# Patient Record
Sex: Male | Born: 1960 | Race: Black or African American | Hispanic: No | Marital: Married | Smoking: Former smoker
Health system: Southern US, Community
[De-identification: ages and names within clinical notes are randomized; demographics above are authoritative.]

## PROBLEM LIST (undated history)

## (undated) DIAGNOSIS — IMO0002 Reserved for concepts with insufficient information to code with codable children: Secondary | ICD-10-CM

## (undated) DIAGNOSIS — M25519 Pain in unspecified shoulder: Secondary | ICD-10-CM

## (undated) DIAGNOSIS — E119 Type 2 diabetes mellitus without complications: Secondary | ICD-10-CM

## (undated) DIAGNOSIS — G4730 Sleep apnea, unspecified: Secondary | ICD-10-CM

## (undated) DIAGNOSIS — I10 Essential (primary) hypertension: Secondary | ICD-10-CM

## (undated) DIAGNOSIS — F329 Major depressive disorder, single episode, unspecified: Secondary | ICD-10-CM

## (undated) DIAGNOSIS — F4310 Post-traumatic stress disorder, unspecified: Secondary | ICD-10-CM

## (undated) DIAGNOSIS — F32A Depression, unspecified: Secondary | ICD-10-CM

## (undated) DIAGNOSIS — F419 Anxiety disorder, unspecified: Secondary | ICD-10-CM

## (undated) DIAGNOSIS — E785 Hyperlipidemia, unspecified: Secondary | ICD-10-CM

## (undated) HISTORY — PX: OTHER SURGICAL HISTORY: SHX169

## (undated) HISTORY — DX: Essential (primary) hypertension: I10

## (undated) HISTORY — PX: HERNIA REPAIR: SHX51

## (undated) HISTORY — DX: Hyperlipidemia, unspecified: E78.5

## (undated) HISTORY — DX: Depression, unspecified: F32.A

## (undated) HISTORY — DX: Reserved for concepts with insufficient information to code with codable children: IMO0002

## (undated) HISTORY — DX: Anxiety disorder, unspecified: F41.9

## (undated) HISTORY — DX: Post-traumatic stress disorder, unspecified: F43.10

## (undated) HISTORY — DX: Major depressive disorder, single episode, unspecified: F32.9

## (undated) HISTORY — DX: Pain in unspecified shoulder: M25.519

## (undated) HISTORY — DX: Type 2 diabetes mellitus without complications: E11.9

## (undated) HISTORY — PX: LEG AMPUTATION: SHX1105

## (undated) NOTE — *Deleted (*Deleted)
63 y.o. male with medical history significant for insulin-dependent type 2 diabetes, hypertension, hyperlipidemia, right AKA, depression, anxiety, PTSD who is admitted with DKA/skin abscesses with cellulitis.  Slow to improve, so ID consulted as well as GS for I/d in the OR.  DKA in setting of insulin-dependent type 2 diabetes: In setting of cellulitis and not taking home insulin for few days. DKA resolved with insulin gtt. Transitioned back to  lantus plus novolog  Cellulitis/abscess-IV abx/vanco per ID -went to the OR on 11/19 for I/D for 2 areas -wound care per general surgery -op cultures indicate MRSA with gm +cocci in clusters. Blood cx -ve. Most likely can go home with po doxycycline.   AKI: In setting of DKA.  Continue management as above and follow labs.  Hypertension: Continue Coreg.  Hold lisinopril with AKI.  Hyperlipidemia: Continue atorvastatin.  Depression/anxiety/PTSD: Continue Wellbutrin, BuSpar, Atarax, prazosin

---

## 2016-04-30 ENCOUNTER — Ambulatory Visit (INDEPENDENT_AMBULATORY_CARE_PROVIDER_SITE_OTHER): Payer: Non-veteran care | Admitting: Licensed Clinical Social Worker

## 2016-04-30 ENCOUNTER — Encounter (INDEPENDENT_AMBULATORY_CARE_PROVIDER_SITE_OTHER): Payer: Self-pay

## 2016-04-30 ENCOUNTER — Encounter (HOSPITAL_COMMUNITY): Payer: Self-pay | Admitting: Licensed Clinical Social Worker

## 2016-04-30 DIAGNOSIS — F411 Generalized anxiety disorder: Secondary | ICD-10-CM | POA: Insufficient documentation

## 2016-04-30 DIAGNOSIS — F331 Major depressive disorder, recurrent, moderate: Secondary | ICD-10-CM | POA: Diagnosis not present
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
## 2016-05-17 ENCOUNTER — Ambulatory Visit (INDEPENDENT_AMBULATORY_CARE_PROVIDER_SITE_OTHER): Payer: Non-veteran care | Admitting: Licensed Clinical Social Worker

## 2016-05-17 DIAGNOSIS — F411 Generalized anxiety disorder: Secondary | ICD-10-CM | POA: Diagnosis not present

## 2016-05-17 DIAGNOSIS — F331 Major depressive disorder, recurrent, moderate: Secondary | ICD-10-CM

## 2016-05-23 ENCOUNTER — Encounter (HOSPITAL_COMMUNITY): Payer: Self-pay | Admitting: Licensed Clinical Social Worker

## 2016-05-23 NOTE — Progress Notes (Signed)
   THERAPIST PROGRESS NOTE  Session Time: 2:10-3pm  Participation Level: Active  Behavioral Response: CasualAlertAnxious  Type of Therapy: Individual Therapy  Treatment Goals addressed: Coping  Interventions: Supportive  Summary: Mark [name] is a 56 y.o. male. Today is the first day of therapy with pt. At last appt pt was referred to MH-IOP Program. 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 Before today pt has been trying to get information about whether Choice program will pay for MH-IOP. Spent the entire session on the phone with VA benefits and Choice Program. It was frustrating for pt and provider trying to get the necessary program for pt. Finally VA faxed information to provider for pt. Gave packet to CM [employer] who is going to follow up with the packet to get the pt started in the MH-IOP program. Pt was thankful for the time spent on trying to get a program for the pt.  Suicidal/Homicidal: Nowithout intent/plan  Therapist Response: Made another appt with this provider and another appt with psychiatrist here at [facility]. Will followup with CM for results of VA pkg.  Plan: Return again in 3 weeks.  Diagnosis: Axis I MDD, recurrent episode moderate        MACKENZIE,LISBETH S, LCAS 05/17/16

## 2016-06-06 ENCOUNTER — Ambulatory Visit (HOSPITAL_COMMUNITY): Payer: No Typology Code available for payment source | Admitting: Licensed Clinical Social Worker

## 2016-06-08 ENCOUNTER — Encounter (HOSPITAL_COMMUNITY): Payer: Self-pay | Admitting: Psychiatry

## 2016-06-08 ENCOUNTER — Ambulatory Visit (INDEPENDENT_AMBULATORY_CARE_PROVIDER_SITE_OTHER): Payer: Non-veteran care | Admitting: Psychiatry

## 2016-06-08 VITALS — BP 140/72 | HR 106

## 2016-06-08 DIAGNOSIS — F331 Major depressive disorder, recurrent, moderate: Secondary | ICD-10-CM

## 2016-06-08 DIAGNOSIS — F17210 Nicotine dependence, cigarettes, uncomplicated: Secondary | ICD-10-CM | POA: Diagnosis not present

## 2016-06-08 DIAGNOSIS — F419 Anxiety disorder, unspecified: Secondary | ICD-10-CM | POA: Diagnosis not present

## 2016-06-08 DIAGNOSIS — F4310 Post-traumatic stress disorder, unspecified: Secondary | ICD-10-CM

## 2016-06-08 MED ORDER — DULOXETINE HCL 20 MG PO CPEP: ORAL_CAPSULE | ORAL | 1 refills | Status: DC
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
## 2016-06-19 ENCOUNTER — Ambulatory Visit (HOSPITAL_COMMUNITY): Payer: No Typology Code available for payment source | Admitting: Psychiatry

## 2016-06-27 ENCOUNTER — Ambulatory Visit (INDEPENDENT_AMBULATORY_CARE_PROVIDER_SITE_OTHER): Payer: Non-veteran care | Admitting: Licensed Clinical Social Worker

## 2016-06-27 DIAGNOSIS — F331 Major depressive disorder, recurrent, moderate: Secondary | ICD-10-CM | POA: Diagnosis not present

## 2016-06-27 DIAGNOSIS — F411 Generalized anxiety disorder: Secondary | ICD-10-CM | POA: Diagnosis not present

## 2016-06-27 NOTE — Progress Notes (Signed)
   THERAPIST PROGRESS NOTE  Session Time: 4:10-5pm  Participation Level: Active  Behavioral Response: CasualAlertAnxious  Type of Therapy: Individual Therapy  Treatment Goals addressed: Coping  Interventions: Supportive  Summary: Mark [name] is a 56 y.o. male. Pt was trying to get into the IOP [facility] but [facility] wouldn't [facility] for it so he now drives to [place] to do an MH-OP program there 1x per week.  Pt discussed his psychiatric symptoms and current life events. Pt went to [place] to see his wife and children. It was good to see his family. Processed with pt his continued living here since his aunt has passed away. Pt reports he has many decisions to make about the next phase of his life. Pt has made many strides getting his aunt's house so that he can use it as an amputee. He is very good at asking the [facility] for benefits to fix his home so that he can be safe in his environment. Pt is having stress and worrying about getting what he needs. Taught pt healthy coping skills, grounding ([id number]) and guided imagery. Suggested also to pt that he use journaling to write down his thoughts and feelings in between sessions. The few minutes remaining pt wanted to discuss control - what he has control of and what he doesn't. Pt was receptive during the intervention.      Suicidal/Homicidal: Nowithout intent/plan  Therapist Response: Assessed pt's current functioning and and reviewed progress. Assisted pt with processing for stressors of family, VA, stress, worrying, healthy coping skills for the management of his stressors.  Plan: Return again in 3 weeks.  Diagnosis: Axis I MDD, recurrent episode moderate Generalized anxiety disorder        MACKENZIE,LISBETH S, LCAS 06/28/15

## 2016-07-02 ENCOUNTER — Encounter (HOSPITAL_COMMUNITY): Payer: Self-pay | Admitting: Licensed Clinical Social Worker

## 2016-07-04 ENCOUNTER — Ambulatory Visit (INDEPENDENT_AMBULATORY_CARE_PROVIDER_SITE_OTHER): Payer: Non-veteran care | Admitting: Licensed Clinical Social Worker

## 2016-07-04 ENCOUNTER — Encounter (HOSPITAL_COMMUNITY): Payer: Self-pay | Admitting: Licensed Clinical Social Worker

## 2016-07-04 DIAGNOSIS — F331 Major depressive disorder, recurrent, moderate: Secondary | ICD-10-CM

## 2016-07-04 NOTE — Progress Notes (Signed)
   THERAPIST PROGRESS NOTE  Session Time: 11:10-12:00pm  Participation Level: Active  Behavioral Response: CasualAlertAnxious  Type of Therapy: Individual Therapy  Treatment Goals addressed: Coping  Interventions: Supportive  Summary: Eugenio Hoesric Lillibridge is a 56 y.o. male. Pt discussed his psychiatric symptoms and current life events. Pt presented frustrated today due to lack of follow through with the [facility]. He is disabled and [facility]. He is good with follow up with the [facility] but they are slow moving. Pt's wife will be coming here this weekend, which will help pt emotionally as well as help him do some things around the house. Discussed at length his decision to stay in [place] or go back to [place] where he has more support. Pt used his problem-solving and decision making skills in the discussion. Pt is attending CBT [facility] per [facility] in [place] with other vets. He enjoys and is hoping to connect with other disabled veterans. Pt feels his meds are working and his depressive and anxious feelings are minimizing. Will continue to work with pt in addition to his CBT group.     Suicidal/Homicidal: Nowithout intent/plan  Therapist Response: Assessed pt's current functioning and and reviewed progress. Assisted pt with processing for stressors of VA, stress, worrying, healthy coping skills for the management of his stressors.  Plan: Return again in 1 weeks.  Diagnosis: Axis I MDD, recurrent episode moderate Generalized anxiety disorder        Riese Hellard S, LCAS  07/04/16

## 2016-07-06 ENCOUNTER — Ambulatory Visit (HOSPITAL_COMMUNITY): Payer: No Typology Code available for payment source | Admitting: Psychiatry

## 2016-07-09 ENCOUNTER — Ambulatory Visit (HOSPITAL_COMMUNITY): Payer: No Typology Code available for payment source | Admitting: Licensed Clinical Social Worker

## 2016-07-11 ENCOUNTER — Ambulatory Visit (HOSPITAL_COMMUNITY): Payer: No Typology Code available for payment source | Admitting: Licensed Clinical Social Worker

## 2016-07-18 ENCOUNTER — Ambulatory Visit (INDEPENDENT_AMBULATORY_CARE_PROVIDER_SITE_OTHER): Payer: Non-veteran care | Admitting: Licensed Clinical Social Worker

## 2016-07-18 DIAGNOSIS — F411 Generalized anxiety disorder: Secondary | ICD-10-CM | POA: Diagnosis not present

## 2016-07-18 DIAGNOSIS — F331 Major depressive disorder, recurrent, moderate: Secondary | ICD-10-CM | POA: Diagnosis not present

## 2016-07-30 ENCOUNTER — Encounter (HOSPITAL_COMMUNITY): Payer: Self-pay | Admitting: Licensed Clinical Social Worker

## 2016-07-30 NOTE — Progress Notes (Signed)
   THERAPIST PROGRESS NOTE  Session Time: 11:10-12:00pm  Participation Level: Active  Behavioral Response: CasualAlertAnxious  Type of Therapy: Individual Therapy  Treatment Goals addressed: Coping  Interventions: Supportive  Summary: Mark [name] is a 56 y.o. male. Pt discussed his psychiatric symptoms and current life events. VA finally put his ramp in which makes it easier for pt and his scooter. Pt still uses his crutches inside which is difficult in cooking. Pt went home for father's day and it was good to be home. He reports he is tired and frustrated. He needs help with arranging his house and has no support. Processed this with pt. Gave pt suggestions on people who will help vets. Pt has started at the gym which should help with some of his tiredness. Pt is an avid reader and taught pt how to get library card in [place]. Pt is attending [facility]. He says it is mandatory for him to attend after his CBT classes. He reports both of these classes are refresher courses but it gets him out of the house. Taught pt mindfulness techniques and explained the process, purpose and practice of the techniques. Pt was open and receptive to the practice and was willing to engage in practice during session.Gave pt handout on mindfulness practice.      Suicidal/Homicidal: Nowithout intent/plan  Therapist Response: Assessed pt's current functioning and and reviewed progress. Assisted pt with processing for stressors of VA, stressors, library card healthy coping skills for the management of his stressors.  Plan: Return again in 2 weeks.  Diagnosis: Axis I MDD, recurrent episode moderate Generalized anxiety disorder        Somya Jauregui S, LCAS  07/18/16

## 2016-08-01 ENCOUNTER — Ambulatory Visit (INDEPENDENT_AMBULATORY_CARE_PROVIDER_SITE_OTHER): Payer: Non-veteran care | Admitting: Psychiatry

## 2016-08-01 ENCOUNTER — Encounter (HOSPITAL_COMMUNITY): Payer: Self-pay | Admitting: Psychiatry

## 2016-08-01 VITALS — BP 138/70 | HR 104 | Ht 73.5 in | Wt 295.0 lb

## 2016-08-01 DIAGNOSIS — F331 Major depressive disorder, recurrent, moderate: Secondary | ICD-10-CM

## 2016-08-01 DIAGNOSIS — F17210 Nicotine dependence, cigarettes, uncomplicated: Secondary | ICD-10-CM | POA: Diagnosis not present

## 2016-08-01 DIAGNOSIS — F4310 Post-traumatic stress disorder, unspecified: Secondary | ICD-10-CM | POA: Diagnosis not present

## 2016-08-01 DIAGNOSIS — G4700 Insomnia, unspecified: Secondary | ICD-10-CM | POA: Diagnosis not present

## 2016-08-01 MED ORDER — DULOXETINE HCL 30 MG PO CPEP: ORAL_CAPSULE | ORAL | 1 refills | Status: DC

## 2016-08-01 MED ORDER — PRAZOSIN HCL 1 MG PO CAPS: 1.0000 mg | ORAL_CAPSULE | Freq: Every day | ORAL | 1 refills | Status: DC

## 2016-08-01 MED ORDER — BUPROPION HCL ER (XL) 300 MG PO TB24: 300.0000 mg | ORAL_TABLET | Freq: Every day | ORAL | 1 refills | Status: DC

## 2016-08-01 NOTE — Progress Notes (Signed)
BH MD/PA/NP OP Progress Note  08/01/2016 8:43 AM Mark [name]  MRN:  [id number]  Chief Complaint:  Chief Complaint    Follow-up; Depression     Subjective:  I like the medication but I forgot to take twice a day.  He is a 56 year old African-American man who [facility]  because of worsening depression and anxiety symptoms.  He was taking Effexor and Wellbutrin for past few years but continued to have depression, anxiety, fatigue, lack of energy, focus and motivation.  He reported his medicines were not changed in past 3 years and his symptoms are gradually getting worse.  Patient has a right knee above knee amputation but he is able to do most of the ADLs.  He was also complaining of lack of libido and desire .  Patient moved from [place] so he can take care of his aunt who eventually died in March.  Patient is taking care of her house.  He still in touch with his wife but he admitted when they're living together there is always argument but now they have good relationship.  Patient also endorsed that he forgot to cut down his Effexor but feeling better with the Cymbalta.  We started Cymbalta 20 mg twice a day but he is only taking 20 mg as he forgot to take the second dose.  He notices energy level is improved.  He is more social, active and started to cook which he usually enjoyed.  He is also applying for a service dog for the companion.  He is making changes at her aunt's house so he can mobilize their easily.  He has no tremors, shakes or any EPS.  We have not received any records from his primary care physician.  He believed his diabetes is also getting better because he is checking his blood sugar but we do not have hemoglobin A1c.  Patient denies any paranoia, hallucination, suicidal thoughts or homicidal thought but admitted some time emotional and easily tearful denies any aggression.  His daughter lives in [place] and to son lives in [place].  Patient denies drinking  alcohol or using any illegal substances.  Currently he is taking Cymbalta 20 mg daily, Wellbutrin XL 300 mg daily and Minipress 1 mg at bedtime for nightmares.  He has notices nightmares are less intense since he started Cymbalta.  He continues to take Effexor but plan to taper down and eventually stopped.   HPI: see above.   Visit Diagnosis:    ICD-10-CM   1. Moderate episode of recurrent major depressive disorder (HCC) F33.1 DULoxetine (CYMBALTA) 30 MG capsule    buPROPion (WELLBUTRIN XL) 300 MG 24 hr tablet  2. PTSD (post-traumatic stress disorder) F43.10 prazosin (MINIPRESS) 1 MG capsule    Past Psychiatric History:  Patient reported feeling depressed since mother died in 2009 and in 2010 he started having knee pain.  [facility] and given Prozac which he took for many years but stopped working.  Patient denies any history of suicidal attempt or any psychiatric inpatient treatment.  He has PTSD and he had excessive treatment [facility].  He was taking Effexor 225 mg along with Wellbutrin and Minipress from [facility].  Patient denies any history of violence or aggression.    Past Medical History:  Past Medical History:  Diagnosis Date  . Above knee amputation status, right (HCC)   . Anxiety   . Depression   . Diabetes mellitus, type II (HCC)   . Shoulder pain  Past Surgical History:  Procedure Laterality Date  . HERNIA REPAIR    . LEG AMPUTATION Right     Family Psychiatric History:Reviewed.   Family History:  Family History  Problem Relation Age of Onset  . Aneurysm Mother   . Leukemia Father     Social History:  Social History   Social History  . Marital status: Married    Spouse name: N/A  . Number of children: N/A  . Years of education: N/A   Social History Main Topics  . Smoking status: Current Every Day Smoker    Packs/day: 1.50    Years: 35.00    Types: Cigarettes  . Smokeless tobacco: Never Used     Comment: Has cut back to 1/2 pack and trying to  quit  . Alcohol use 3.0 oz/week    2 Glasses of wine, 2 Shots of liquor, 1 Cans of beer per week  . Drug use: No  . Sexual activity: Not Currently   Other Topics Concern  . None   Social History Narrative  . None    Allergies:  Allergies  Allergen Reactions  . Lyrica [Pregabalin] Rash    Metabolic Disorder Labs: No results found for: HGBA1C, MPG No results found for: PROLACTIN No results found for: CHOL, TRIG, HDL, CHOLHDL, VLDL, LDLCALC   Current Medications: Current Outpatient Prescriptions  Medication Sig Dispense Refill  . buPROPion (WELLBUTRIN XL) 300 MG 24 hr tablet Take 1 tablet (300 mg total) by mouth daily. 30 tablet 1  . cyclobenzaprine (FLEXERIL) 10 MG tablet Take 10 mg by mouth 3 (three) times daily as needed for muscle spasms.    Marland Kitchen docusate sodium (COLACE) 50 MG capsule Take 50 mg by mouth 2 (two) times daily.    . DULoxetine (CYMBALTA) 30 MG capsule Take 1 capsule daily for 1 week and than twice daily 60 capsule 1  . insulin aspart protamine- aspart (NOVOLOG MIX 70/30) (70-30) 100 UNIT/ML injection Inject into the skin.    . Insulin Glargine (LANTUS [place]) Inject into the skin.    . metFORMIN (GLUCOPHAGE) 1000 MG tablet Take 1,000 mg by mouth 2 (two) times daily with a meal.    . oxyCODONE-acetaminophen (PERCOCET) 5-325 MG tablet Take by mouth every 4 (four) hours as needed for severe pain.    . prazosin (MINIPRESS) 1 MG capsule Take 1 capsule (1 mg total) by mouth at bedtime. 30 capsule 1  . sildenafil (VIAGRA) 100 MG tablet Take 100 mg by mouth daily as needed for erectile dysfunction.    Marland Kitchen atorvastatin (LIPITOR) 80 MG tablet Take 80 mg by mouth daily.    Marland Kitchen lisinopril (PRINIVIL,ZESTRIL) 5 MG tablet Take 5 mg by mouth daily.     No current facility-administered medications for this visit.     Neurologic: Headache: No Seizure: No Paresthesias: No  Musculoskeletal: Strength & Muscle Tone: decreased Gait & Station: unsteady, Patient has a right above-knee  amputation. Patient leans: See above  Psychiatric Specialty Exam: Review of Systems  Constitutional: Negative.   HENT: Negative.   Eyes: Negative.   Respiratory: Negative.   Cardiovascular: Negative.   Gastrointestinal: Negative.   Genitourinary: Negative.   Musculoskeletal: Positive for joint pain.       Right above-knee amputation  Skin: Negative.  Negative for itching and rash.  Neurological: Negative.   Psychiatric/Behavioral: The patient is nervous/anxious and has insomnia.     Blood pressure 138/70, pulse (!) 104, height 6' 1.5" (1.867 m), weight 295 lb (133.8 kg).Body mass  index is 38.39 kg/m.  General Appearance: Casual  Eye Contact:  Good  Speech:  Clear and Coherent  Volume:  Normal  Mood:  Angry  Affect:  Constricted  Thought Process:  Goal Directed  Orientation:  Full (Time, Place, and Person)  Thought Content: Rumination   Suicidal Thoughts:  No  Homicidal Thoughts:  No  Memory:  Immediate;   Good Recent;   Good Remote;   Good  Judgement:  Good  Insight:  Good  Psychomotor Activity:  Normal  Concentration:  Concentration: Fair and Attention Span: Good  Recall:  Good  Fund of Knowledge: Good  Language: Good  Akathisia:  No  Handed:  Right  AIMS (if indicated):  0  Assets:  Communication Skills Desire for Improvement Housing Resilience  ADL's:  Intact  Cognition: WNL  Sleep: fair    Assessment: Major depressive disorder, recurrent.  Posttraumatic stress disorder.  Plan: I review his symptoms, psychosocial stressors in current medication.  Patient forgot to reduce Effexor and he continued to endorse loss of libido and desire.  He promised that he will taper down the Effexor as he started Cymbalta.  He is feeling better with Cymbalta.  Recommended to increase Cymbalta 30 mg and take twice a day.  Reduce Effexor from 225-150 for 1 week and then 75 and eventually stopped.  Continue Minipress but he is taking only 1 mg at bedtime which is helping his  nightmare.  Continue Wellbutrin XL 300 mg daily.  We are still awaiting records from primary care physician area patient will get hemoglobin A1c in few weeks.  Encouraged to continue therapy with Beth.  Discussed meditation side effects and benefits.  He has no tremors shakes or any EPS.  Recommended to call us back if he is any question, concern or if he feels worsening of the symptom.  Reassurance given and explained treatment plan in detail.  Follow-up in 2 months.  Discuss safety concerns that anytime having active suicidal thoughts or homicidal thought that he need to call 911 or go to the local emergency room.  Jere Bostrom T., MD 08/01/2016, 8:43 AM

## 2016-08-13 ENCOUNTER — Ambulatory Visit (INDEPENDENT_AMBULATORY_CARE_PROVIDER_SITE_OTHER): Payer: Non-veteran care | Admitting: Licensed Clinical Social Worker

## 2016-08-13 DIAGNOSIS — F331 Major depressive disorder, recurrent, moderate: Secondary | ICD-10-CM | POA: Diagnosis not present

## 2016-08-14 ENCOUNTER — Encounter (HOSPITAL_COMMUNITY): Payer: Self-pay | Admitting: Licensed Clinical Social Worker

## 2016-08-14 NOTE — Progress Notes (Signed)
   THERAPIST PROGRESS NOTE  Session Time: 4:10-5pm  Participation Level: Active  Behavioral Response: Casual/Alert/Eurthymic  Type of Therapy: Individual Therapy  Treatment Goals addressed: Coping  Interventions: Supportive  Summary: Mark [name] is a 56 y.o. male. Pt discussed his psychiatric symptoms and current life events. Pt was  More upbeat today and reports his medications are working. He was in a wheelchair because his scooter is broken. He is waiting for the VA to come repair it. He was also using his crutches. Pt has been busy in his house finally getting the house wheelchair ready. [facility] but will not begin anger management classes in 4 weeks. Pt is going to the gym regularly which has improved his mood. He has also looked into getting a dog and a service dog.  Pt reports he has been using the mindfulness techniques practiced in last session, from the handout. Asked pt open ended questions about his support system. Pt has some relatives that help him. Suggested going to [facility] to see if they have support groups for vets with amputations. Taught pt grounding techniques where pt practiced in session, gave him handout.          Suicidal/Homicidal: Nowithout intent/plan  Therapist Response: Assessed pt's current functioning and and reviewed progress. Assisted pt with processing for stressors of VA, exercise, mindfulness and grounding techniques. Assisted pt processing healthy coping skills for the management of his stressors.  Plan: Return again in 2 weeks.  Diagnosis: Axis I MDD, recurrent episode moderate Generalized anxiety disorder        Hance Caspers S, LCAS  08/13/16

## 2016-08-20 ENCOUNTER — Ambulatory Visit (HOSPITAL_COMMUNITY): Payer: No Typology Code available for payment source | Admitting: Licensed Clinical Social Worker

## 2016-08-27 ENCOUNTER — Ambulatory Visit (INDEPENDENT_AMBULATORY_CARE_PROVIDER_SITE_OTHER): Payer: Non-veteran care | Admitting: Licensed Clinical Social Worker

## 2016-08-27 DIAGNOSIS — F331 Major depressive disorder, recurrent, moderate: Secondary | ICD-10-CM

## 2016-08-28 ENCOUNTER — Encounter (HOSPITAL_COMMUNITY): Payer: Self-pay | Admitting: Licensed Clinical Social Worker

## 2016-08-28 NOTE — Progress Notes (Signed)
   THERAPIST PROGRESS NOTE  Session Time: 11:10-12pm  Participation Level: Active  Behavioral Response: Casual/Alert/Eurthymic  Type of Therapy: Individual Therapy  Treatment Goals addressed: Coping  Interventions: Supportive  Summary: Mark [name] is a 56 y.o. male. Pt discussed his psychiatric symptoms and current life events. Pt was  More upbeat today and reports his medications are working. He was in a s scooter which is still broken. He is waiting for the VA to get another piece.to repair it. Pt is frustrated with the VA and how long it takes to get his needs met. Pt is also waiting on the [place] for his wheelchair, which will help him get around better in his house. And complete ADLs. Pt is still looking into a service dog. He has several leads. Pt is going home next weekend to have a family meeting. Processed how to ask for what he needs with his family. Role played with him.Processed with pt his happiness and contentment must come from within. Gave pt happiness worksheet to complete at home.           Suicidal/Homicidal: Nowithout intent/plan  Therapist Response: Assessed pt's current functioning and and reviewed progress. Assisted pt with processing for stressors of VA, family, needs being met, [facility]. Assisted pt processing healthy coping skills for the management of his stressors.  Plan: Return again in 2 weeks.  Diagnosis: Axis I MDD, recurrent episode moderate Generalized anxiety disorder        MACKENZIE,LISBETH S, LCAS  08/27/16

## 2016-09-03 ENCOUNTER — Encounter (HOSPITAL_COMMUNITY): Payer: Self-pay | Admitting: Licensed Clinical Social Worker

## 2016-09-03 ENCOUNTER — Ambulatory Visit (INDEPENDENT_AMBULATORY_CARE_PROVIDER_SITE_OTHER): Payer: Non-veteran care | Admitting: Licensed Clinical Social Worker

## 2016-09-03 DIAGNOSIS — F411 Generalized anxiety disorder: Secondary | ICD-10-CM | POA: Diagnosis not present

## 2016-09-03 DIAGNOSIS — F331 Major depressive disorder, recurrent, moderate: Secondary | ICD-10-CM

## 2016-09-03 NOTE — Progress Notes (Signed)
   THERAPIST PROGRESS NOTE  Session Time: 11:10-12pm  Participation Level: Active  Behavioral Response: Casual/Alert/Exhausted  Type of Therapy: Individual Therapy  Treatment Goals addressed: Coping  Interventions: Supportive  Summary: Mark [name] is a 56 y.o. male. Pt discussed his psychiatric symptoms and current life events. Pt was exhausted today as he presented for his appointment. Pt had just driven home from [place] after visiting with his family. Pt reports he had a good visit with his 2 children, who are getting ready to return to college. Pt had a family meeting with everyone to discuss his feelings and concerns. Discussed basic CBT concepts with pt, the thought and emotion connection and alternative perspectives. Pt did not complete his worksheet due to his trip home. Processed with pt how his happiness is his own responsibility and how to make his own goals for his life.       Suicidal/Homicidal: Nowithout intent/plan  Therapist Response: Assessed pt's current functioning and and reviewed progress. Assisted pt with processing for stressors of family, needs being met, CBT concepts, Assisted pt processing healthy coping skills for the management of his stressors.  Plan: Return again in 2 weeks.  Diagnosis: Axis I MDD, recurrent episode moderate Generalized anxiety disorder        Kirsten Spearing S, LCAS  09/03/16

## 2016-09-10 ENCOUNTER — Ambulatory Visit (HOSPITAL_COMMUNITY): Payer: No Typology Code available for payment source | Admitting: Licensed Clinical Social Worker

## 2016-09-17 ENCOUNTER — Ambulatory Visit (INDEPENDENT_AMBULATORY_CARE_PROVIDER_SITE_OTHER): Payer: Non-veteran care | Admitting: Licensed Clinical Social Worker

## 2016-09-17 ENCOUNTER — Encounter (HOSPITAL_COMMUNITY): Payer: Self-pay | Admitting: Licensed Clinical Social Worker

## 2016-09-17 DIAGNOSIS — F331 Major depressive disorder, recurrent, moderate: Secondary | ICD-10-CM | POA: Diagnosis not present

## 2016-09-17 NOTE — Progress Notes (Signed)
   THERAPIST PROGRESS NOTE  Session Time: 11:10-12pm  Participation Level: Active  Behavioral Response: Casual/Alert/Eurythmic  Type of Therapy: Individual Therapy  Treatment Goals addressed: Coping  Interventions: Supportive  Summary: Mark [name] is a 56 y.o. male. Pt discussed his psychiatric symptoms and current life events. Pt has made some progress with getting his house handicap ready. Discussed with pt his feelings of "I am damaged goods." Taught pt how to challenge his belief system using emotional regulation skills. Pt was able to formulate healthier alternative thoughts.Explained purpose process and practice of mindfulness techniques. Suggested to pt to continue his use of mindfulness practice at home. Pt was open to suggestions.            Suicidal/Homicidal: Nowithout intent/plan  Therapist Response: Assessed pt's current functioning and and reviewed progress. Assisted pt with processing for stressors of self , needs being met, emotional regular skills, mindfulness practice. Assisted pt processing healthy coping skills for the management of his stressors.  Plan: Return again in 2 weeks.  Diagnosis: Axis I MDD, recurrent episode moderate Generalized anxiety disorder        MACKENZIE,LISBETH S, LCAS  09/17/16

## 2016-10-02 ENCOUNTER — Ambulatory Visit (INDEPENDENT_AMBULATORY_CARE_PROVIDER_SITE_OTHER): Payer: Non-veteran care | Admitting: Psychiatry

## 2016-10-02 ENCOUNTER — Encounter (HOSPITAL_COMMUNITY): Payer: Self-pay | Admitting: Psychiatry

## 2016-10-02 DIAGNOSIS — F17210 Nicotine dependence, cigarettes, uncomplicated: Secondary | ICD-10-CM | POA: Diagnosis not present

## 2016-10-02 DIAGNOSIS — F515 Nightmare disorder: Secondary | ICD-10-CM | POA: Diagnosis not present

## 2016-10-02 DIAGNOSIS — F331 Major depressive disorder, recurrent, moderate: Secondary | ICD-10-CM | POA: Diagnosis not present

## 2016-10-02 DIAGNOSIS — F4310 Post-traumatic stress disorder, unspecified: Secondary | ICD-10-CM | POA: Diagnosis not present

## 2016-10-02 MED ORDER — BUPROPION HCL ER (XL) 300 MG PO TB24: 300.0000 mg | ORAL_TABLET | Freq: Every day | ORAL | 0 refills | Status: DC

## 2016-10-02 MED ORDER — PRAZOSIN HCL 1 MG PO CAPS: 1.0000 mg | ORAL_CAPSULE | Freq: Every day | ORAL | 0 refills | Status: DC

## 2016-10-02 MED ORDER — DULOXETINE HCL 30 MG PO CPEP: ORAL_CAPSULE | ORAL | 0 refills | Status: DC

## 2016-10-02 NOTE — Progress Notes (Signed)
BH MD/PA/NP OP Progress Note  10/02/2016 1:38 PM Mark [name]  MRN:  [id number]  Chief Complaint: I am doing better with the medication. Chief Complaint    Follow-up     HPI: [name] came for his follow-up appointment.  He is taking Cymbalta 30 mg twice a day which was increased on his last visit.  He is less depressed and less irritable.  He is also taking Wellbutrin in the morning and Minipress at bedtime is helping his nightmares and flashback.  He is seeing Beth regularly for counseling.  He is also applying for a services.but due to waiting list he got a [place] and now he is getting her trained.  He started weekly group therapy at [facility] and that is helping him a lot.  He still have some time irritability and he does get frustrated with his wife but overall he reported his mood has been stable.  He has no rash, itching or any tremors.  He continued to keep himself busy doing renovation project at his aunt 's house where he lives.  We have not receved any records from [place] but he seen his primary care physician and his hemoglobin and C was 7.6.  His appetite is okay.  His vital signs are stable.  He is no longer taking Effexor.  His son lives in [place] with his wife.  Patient denies drinking alcohol or using any illegal substances.  He wants to continue his current psychiatric medication.    Visit Diagnosis:    ICD-10-CM   1. PTSD (post-traumatic stress disorder) F43.10 prazosin (MINIPRESS) 1 MG capsule  2. Moderate episode of recurrent major depressive disorder (HCC) F33.1 DULoxetine (CYMBALTA) 30 MG capsule    buPROPion (WELLBUTRIN XL) 300 MG 24 hr tablet    Past Psychiatric Hireviewed. nt reported feeling depressed since mother died in 27-May-2007 and in 05-26-2008 he started having knee pain.  He saw psychiatrist at [facility] and given Prozac which he took for many years but stopped working.  Patient denies any history of suicidal attempt or any psychiatric inpatient treatment.  He has PTSD and he had  excessive treatment at [place].  He was taking Effexor 225 mg along with Wellbutrin and Minipress from [place].  Patient denies any history of violence or aggression.   Past Medical History:  Past Medical History:  Diagnosis Date  . Above knee amputation status, right (HCC)   . Anxiety   . Depression   . Diabetes mellitus, type II (HCC)   . Shoulder pain     Past Surgical History:  Procedure Laterality Date  . HERNIA REPAIR    . LEG AMPUTATION Right     Family Psychiatric History: Reviewed.    Family History:  Family History  Problem Relation Age of Onset  . Aneurysm Mother   . Leukemia Father     Social History:  Social History   Social History  . Marital status: Married    Spouse name: N/A  . Number of children: N/A  . Years of education: N/A   Social History Main Topics  . Smoking status: Current Every Day Smoker    Packs/day: 1.50    Years: 35.00    Types: Cigarettes  . Smokeless tobacco: Never Used     Comment: Has cut back to 1/2 pack and trying to quit  . Alcohol use 3.0 oz/week    2 Glasses of wine, 2 Shots of liquor, 1 Cans of beer per week  . Drug use: No  .  Sexual activity: Not Currently   Other Topics Concern  . Not on file   Social History Narrative  . No narrative on file    Allergies:  Allergies  Allergen Reactions  . Lyrica [Pregabalin] Rash    Metabolic Disorder Labs: No results found for: HGBA1C, MPG No results found for: PROLACTIN No results found for: CHOL, TRIG, HDL, CHOLHDL, VLDL, LDLCALC No results found for: TSH  Therapeutic Level Labs: No results found for: LITHIUM No results found for: VALPROATE No components found for:  CBMZ  Current Medications: Current Outpatient Prescriptions  Medication Sig Dispense Refill  . atorvastatin (LIPITOR) 80 MG tablet Take 80 mg by mouth daily.    Marland Kitchen buPROPion (WELLBUTRIN XL) 300 MG 24 hr tablet Take 1 tablet (300 mg total) by mouth daily. 30 tablet 1  . cyclobenzaprine (FLEXERIL) 10 MG tablet  Take 10 mg by mouth 3 (three) times daily as needed for muscle spasms.    Marland Kitchen docusate sodium (COLACE) 50 MG capsule Take 50 mg by mouth 2 (two) times daily.    . DULoxetine (CYMBALTA) 30 MG capsule Take 1 capsule daily for 1 week and than twice daily 60 capsule 1  . insulin aspart protamine- aspart (NOVOLOG MIX 70/30) (70-30) 100 UNIT/ML injection Inject into the skin.    . Insulin Glargine (LANTUS [place]) Inject into the skin.    Marland Kitchen lisinopril (PRINIVIL,ZESTRIL) 5 MG tablet Take 5 mg by mouth daily.    . metFORMIN (GLUCOPHAGE) 1000 MG tablet Take 1,000 mg by mouth 2 (two) times daily with a meal.    . oxyCODONE-acetaminophen (PERCOCET) 5-325 MG tablet Take by mouth every 4 (four) hours as needed for severe pain.    . prazosin (MINIPRESS) 1 MG capsule Take 1 capsule (1 mg total) by mouth at bedtime. 30 capsule 1  . sildenafil (VIAGRA) 100 MG tablet Take 100 mg by mouth daily as needed for erectile dysfunction.     No current facility-administered medications for this visit.      Musculoskeletal: Strength & Muscle Tone: within normal limits Gait & Station: Patient has a right above-the-knee amputation Patient leans: See above  Psychiatric Specialty Exam: ROS  There were no vitals taken for this visit.There is no height or weight on file to calculate BMI.  General Appearance: Casual  Eye Contact:  Good  Speech:  Clear and Coherent  Volume:  Normal  Mood:  Anxious  Affect:  Appropriate  Thought Process:  Goal Directed  Orientation:  Full (Time, Place, and Person)  Thought Content: Logical   Suicidal Thoughts:  No  Homicidal Thoughts:  No  Memory:  Immediate;   Good Recent;   Good Remote;   Good  Judgement:  Good  Insight:  Good  Psychomotor Activity:  Normal  Concentration:  Concentration: Good and Attention Span: Good  Recall:  Good  Fund of Knowledge: Good  Language: Good  Akathisia:  No  Handed:  Right  AIMS (if indicated): not done  Assets:  Communication Skills Desire for  Improvement Housing  ADL's:  Intact  Cognition: WNL  Sleep:  Good   Screenings:   Assessment and Plan: major depressive disorder, recurrent.  Posttraumatic stress disorder.  Patient is doing better on Cymbalta, Wellbutrin and Minipress.  He is seeing Beth for therapy and also started weekly therapy at [place].  He has no side effects from medication.  I will continue Cymbalta 30 mg twice a day, Wellbutrin XL 300 mg daily and Minipress 1 mg at bedtime.  Recommended to call us back if he is any question or any concern.  Follow-up in 3 months.    Keylan Costabile T., MD 10/02/2016, 1:38 PM

## 2016-10-08 ENCOUNTER — Ambulatory Visit (HOSPITAL_COMMUNITY): Payer: No Typology Code available for payment source | Admitting: Licensed Clinical Social Worker

## 2016-10-15 ENCOUNTER — Ambulatory Visit (HOSPITAL_COMMUNITY): Payer: Self-pay | Admitting: Licensed Clinical Social Worker

## 2016-10-15 NOTE — Progress Notes (Unsigned)
   THERAPIST PROGRESS NOTE  Session Time: 11:10-12pm  Participation Level: Active  Behavioral Response: Casual/Alert/Eurythmic  Type of Therapy: Individual Therapy  Treatment Goals addressed: Coping  Interventions: Supportive  Summary: Mark [name] is a 56 y.o. male. Pt discussed his psychiatric symptoms and current life events. Pt has made some progress with getting his house handicap ready. Discussed with pt his feelings of "I am damaged goods." Taught pt how to challenge his belief system using emotional regulation skills. Pt was able to formulate healthier alternative thoughts.Explained purpose process and practice of mindfulness techniques. Suggested to pt to continue his use of mindfulness practice at home. Pt was open to suggestions.            Suicidal/Homicidal: Nowithout intent/plan  Therapist Response: Assessed pt's current functioning and and reviewed progress. Assisted pt with processing for stressors of self , needs being met, emotional regular skills, mindfulness practice. Assisted pt processing healthy coping skills for the management of his stressors.  Plan: Return again in 2 weeks.  Diagnosis: Axis I MDD, recurrent episode moderate Generalized anxiety disorder        [name], LCAS  09/17/16  

## 2016-10-22 ENCOUNTER — Ambulatory Visit (INDEPENDENT_AMBULATORY_CARE_PROVIDER_SITE_OTHER): Payer: No Typology Code available for payment source | Admitting: Licensed Clinical Social Worker

## 2016-10-22 DIAGNOSIS — F331 Major depressive disorder, recurrent, moderate: Secondary | ICD-10-CM | POA: Diagnosis not present

## 2016-10-24 ENCOUNTER — Encounter (HOSPITAL_COMMUNITY): Payer: Self-pay | Admitting: Licensed Clinical Social Worker

## 2016-10-24 NOTE — Progress Notes (Signed)
   THERAPIST PROGRESS NOTE  Session Time: 10:10-11am  Participation Level: Active  Behavioral Response: Casual/Alert/Eurythmic  Type of Therapy: Individual Therapy  Treatment Goals addressed: Coping  Interventions: Supportive  Summary: Mark [name] is a 56 y.o. male. Pt discussed his psychiatric symptoms and current life events. Pt reports less depressive symptoms. He continues to make progress with getting his house handicap ready. He was in a new electric chair where he can move around more easily and can cook for himself. Pt has found a dog and is completing paperwork to get it trained as a service dog. The dog will assist pt at home as well as out in the community. Pt is attending CBT groups at [place] in [place]. He enjoys the groups and the support from other disabled veterans. Pt also wants to become acitve in an amputee support group through the [place]. Asked pt open ended questions about how his current life adaptations. Pt was open and honest in talking about his feelings of powerlessness, isolation, depression. Assisted pt processing the feelings and validated pt for his ability to talk about his limitations.            Suicidal/Homicidal: Nowithout intent/plan  Therapist Response: Assessed pt's current functioning and and reviewed progress. Assisted pt with processing support groups available, cbt,  feelings surrounding his limitations.. Assisted pt processing healthy coping skills for the management of his stressors.  Plan: Return again in 2 weeks.  Diagnosis: Axis I MDD, recurrent episode moderate         MACKENZIE,LISBETH S, LCAS  10/22/16

## 2016-10-29 ENCOUNTER — Ambulatory Visit (HOSPITAL_COMMUNITY): Payer: Self-pay | Admitting: Licensed Clinical Social Worker

## 2016-11-12 ENCOUNTER — Encounter (HOSPITAL_COMMUNITY): Payer: Self-pay | Admitting: Licensed Clinical Social Worker

## 2016-11-12 ENCOUNTER — Ambulatory Visit (INDEPENDENT_AMBULATORY_CARE_PROVIDER_SITE_OTHER): Payer: No Typology Code available for payment source | Admitting: Licensed Clinical Social Worker

## 2016-11-12 ENCOUNTER — Other Ambulatory Visit (HOSPITAL_COMMUNITY): Payer: Self-pay

## 2016-11-12 DIAGNOSIS — F331 Major depressive disorder, recurrent, moderate: Secondary | ICD-10-CM

## 2016-11-12 DIAGNOSIS — F4310 Post-traumatic stress disorder, unspecified: Secondary | ICD-10-CM

## 2016-11-12 MED ORDER — BUPROPION HCL ER (XL) 300 MG PO TB24
300.0000 mg | ORAL_TABLET | Freq: Every day | ORAL | 0 refills | Status: DC
Start: 2016-11-12 — End: 2017-03-20

## 2016-11-12 MED ORDER — PRAZOSIN HCL 1 MG PO CAPS: 1.0000 mg | ORAL_CAPSULE | Freq: Every day | ORAL | 0 refills | Status: DC

## 2016-11-12 MED ORDER — DULOXETINE HCL 30 MG PO CPEP: ORAL_CAPSULE | ORAL | 0 refills | Status: DC

## 2016-11-12 NOTE — Progress Notes (Signed)
   THERAPIST PROGRESS NOTE  Session Time: 11:10-12:00 pm  Participation Level: Active  Behavioral Response: Casual/Alert/Frustrated  Type of Therapy: Individual Therapy  Treatment Goals addressed: Coping  Interventions: Supportive  Summary: Mark [name] is a 56 y.o. male. Pt discussed his psychiatric symptoms and current life events. Pt presented frustrated today. Pt is an above the leg amputee. He continues to make progress with getting his house handicap ready. However, [facility] is problematic. Once something gets handicap ready something else breaks or is not completed and pt must contact VA to complete paperwork for repairs. Discussed with pt mindfulness practices to assist with frustration. Pt continues to use his new electric chair however he got stuck in the yard when it was raining and had to call EMS.. Asked pt open ended questions about  his current life adaptations, impatience [facility] the [place]. Pt was open and honest in talking about his feelings of powerlessness, isolation, depression. Assisted pt processing the feelings and validated pt for his ability to talk about his limitations.            Suicidal/Homicidal: Nowithout intent/plan  Therapist Response: Assessed pt's current functioning and and reviewed progress. Assisted pt processing frustration, powerlessness, adaptations  feelings surrounding his limitations.. Assisted pt processing healthy coping skills for the management of his stressors.  Plan: Return again in 2 weeks.  Diagnosis: Axis I MDD, recurrent episode moderate         Hershey Knauer S, LCAS  11/12/16

## 2016-11-26 ENCOUNTER — Encounter (HOSPITAL_COMMUNITY): Payer: Self-pay | Admitting: Licensed Clinical Social Worker

## 2016-11-26 ENCOUNTER — Ambulatory Visit (INDEPENDENT_AMBULATORY_CARE_PROVIDER_SITE_OTHER): Payer: Self-pay | Admitting: Licensed Clinical Social Worker

## 2016-11-26 DIAGNOSIS — F331 Major depressive disorder, recurrent, moderate: Secondary | ICD-10-CM

## 2016-11-26 NOTE — Progress Notes (Signed)
   THERAPIST PROGRESS NOTE  Session Time: 11:10-12:00 pm  Participation Level: Active  Behavioral Response: Casual/Alert/Depressed  Type of Therapy: Individual Therapy  Treatment Goals addressed: Coping  Interventions: Supportive  Summary: Eugenio Hoesric Pe is a 56 y.o. male. Pt discussed his psychiatric symptoms and current life events. Pt shared his depression was a little worse this weekend. He doesn't know exactly why his thoughts were negative. Used CBT to challenge his irrational thoughts and help him build rational supportive thinking. [facility]. Pt is receiving his prosthetic leg Thursday. He is [facility] [place] for 2 weeks for intensive OT and PT services, which should assist with his transition to a prosthetic. Assisted pt with emotional reflection and the upcoming positive changes in his life. Pt has had negative experiences from a previous prosthetic leg. Assisted pt in building hope and strengthening resiliency.   Suicidal/Homicidal: Nowithout intent/plan  Therapist Response: Assessed pt's current functioning and and reviewed progress. Assisted pt processing frustration, powerlessness, prosthetic leg, adaptations,  feelings surrounding his limitations.. Assisted pt processing healthy coping skills for the management of his stressors.  Plan: Return again in 2 weeks.  Diagnosis: Axis I MDD, recurrent episode moderate         Chayne Baumgart S, LCAS  11/26/16

## 2016-12-10 ENCOUNTER — Ambulatory Visit (INDEPENDENT_AMBULATORY_CARE_PROVIDER_SITE_OTHER): Payer: Self-pay | Admitting: Licensed Clinical Social Worker

## 2016-12-10 ENCOUNTER — Encounter (HOSPITAL_COMMUNITY): Payer: Self-pay | Admitting: Licensed Clinical Social Worker

## 2016-12-10 DIAGNOSIS — F331 Major depressive disorder, recurrent, moderate: Secondary | ICD-10-CM

## 2016-12-10 NOTE — Progress Notes (Signed)
   THERAPIST PROGRESS NOTE  Session Time: 11:10-12:00 pm  Participation Level: Active  Behavioral Response: Casual/Alert/Depressed  Type of Therapy: Individual Therapy  Treatment Goals addressed: Coping  Interventions: Supportive  Summary: Eugenio Hoesric Guse is a 56 y.o. male. Pt discussed his psychiatric symptoms and current life events. Pt is going back to [place] for the holiday. Used MI and CBT to help build and support his rational and supportive thinking. Going home will be stressful with his wife. He will get to spend quality time with his children. Pt was fitted for his prosthetic leg  But it is missing a piece that must [facility]. Assisted pt with emotional reflection and the upcoming positive changes in his life.[facility]. Pt continues to work on closing out his aunt's will and final wishes.  Assisted pt in building hope and strengthening resiliency.   Suicidal/Homicidal: Nowithout intent/plan  Therapist Response: Assessed pt's current functioning and and reviewed progress. Assisted pt processing frustration, powerlessness, prosthetic leg, adaptations,  feelings surrounding his limitations. Assisted pt processing healthy coping skills for the management of his stressors.  Plan: Return again in 2 weeks.  Diagnosis: Axis I MDD, recurrent episode moderate         MACKENZIE,LISBETH S, LCAS  12/10/16

## 2016-12-24 ENCOUNTER — Ambulatory Visit (HOSPITAL_COMMUNITY): Payer: No Typology Code available for payment source | Admitting: Licensed Clinical Social Worker

## 2016-12-31 ENCOUNTER — Ambulatory Visit (HOSPITAL_COMMUNITY): Payer: No Typology Code available for payment source | Admitting: Licensed Clinical Social Worker

## 2017-01-01 ENCOUNTER — Ambulatory Visit (HOSPITAL_COMMUNITY): Payer: Self-pay | Admitting: Psychiatry

## 2017-01-07 ENCOUNTER — Ambulatory Visit (HOSPITAL_COMMUNITY): Payer: No Typology Code available for payment source | Admitting: Licensed Clinical Social Worker

## 2017-01-21 ENCOUNTER — Ambulatory Visit (HOSPITAL_COMMUNITY): Payer: No Typology Code available for payment source | Admitting: Licensed Clinical Social Worker

## 2017-01-28 ENCOUNTER — Encounter (HOSPITAL_COMMUNITY): Payer: Self-pay | Admitting: Licensed Clinical Social Worker

## 2017-01-28 ENCOUNTER — Ambulatory Visit (INDEPENDENT_AMBULATORY_CARE_PROVIDER_SITE_OTHER): Payer: Self-pay | Admitting: Licensed Clinical Social Worker

## 2017-01-28 DIAGNOSIS — F411 Generalized anxiety disorder: Secondary | ICD-10-CM

## 2017-01-28 DIAGNOSIS — F331 Major depressive disorder, recurrent, moderate: Secondary | ICD-10-CM

## 2017-01-28 NOTE — Progress Notes (Signed)
   THERAPIST PROGRESS NOTE  Session Time: 11:10-12:00 pm  Participation Level: Active  Behavioral Response: Casual/Alert/Depressed  Type of Therapy: Individual Therapy  Treatment Goals addressed: Coping  Interventions: Supportive  Summary: Mark [name] is a 57 y.o. male. Pt discussed his psychiatric symptoms and current life events.  Pt presented looking depressed. He says on depression scale, 0-10 with 10 being worse depression he is an 8, anxiety . His scooter and electric wheelchair are broken. The [facility] has been unable to get to him to repair either so he has had to rely on crutches which makes it difficult trying to complete ADL's. He reports they are supposed to come tomorrow to repair both the electric wheelchair and scooter. Pt reports he did not have a good holiday and is frustrated with his children. They are insensitive to his disability and do not offer to help him. Discussed relationship boundaries and disappointments with him. Pt has been unable to come to therapy in almost 6 weeks due to his uncle's passing and the holidays. He is ready to get back on a regular schedule. [facility] [place] and [facility].Assisted pt with emotional reflection and the upcoming positive changes in his life. Continue to assist pt in building hope and strengthening resiliency.   Suicidal/Homicidal: Nowithout intent/plan  Therapist Response: Assessed pt's current functioning and and reviewed progress. Assisted pt processing frustration, powerlessness, family, holidays, VA, adaptations,  feelings surrounding his limitations. Assisted pt processing healthy coping skills for the management of his stressors.  Plan: Return again in 2 weeks.  Diagnosis: Axis I MDD, recurrent episode moderate         MACKENZIE,LISBETH S, LCAS  01/28/17

## 2017-02-04 ENCOUNTER — Encounter (HOSPITAL_COMMUNITY): Payer: Self-pay | Admitting: Licensed Clinical Social Worker

## 2017-02-04 ENCOUNTER — Ambulatory Visit (INDEPENDENT_AMBULATORY_CARE_PROVIDER_SITE_OTHER): Payer: Self-pay | Admitting: Licensed Clinical Social Worker

## 2017-02-04 DIAGNOSIS — F331 Major depressive disorder, recurrent, moderate: Secondary | ICD-10-CM

## 2017-02-04 NOTE — Progress Notes (Signed)
   THERAPIST PROGRESS NOTE  Session Time: 11:10-12:00 pm  Participation Level: Active  Behavioral Response: Casual/Alert/Depressed  Type of Therapy: Individual Therapy  Treatment Goals addressed: Coping  Interventions: Supportive  Summary: Eugenio Hoesric Cafiero is a 57 y.o. male. Pt discussed his psychiatric symptoms and current life events.  Pt presented on his scooter today that was [facility] the [facility] finally. Pt was not as depressed as last session but reports his anxiety has increased. Taught pt distress tolerance skills. Taught pt radical acceptance, teaching him a healthier way of thinking through acceptance vs anger which manifests into anxiety. Taught pt self soothe with senses. Continue to work with pt on enforcing family boundaries.       Suicidal/Homicidal: Nowithout intent/plan  Therapist Response: Assessed pt's current functioning and and reviewed progress. Assisted pt processing distress tolerance skills, radical acceptance, self soothe with senses mindfulness activity, anger, family boundaries. Assisted pt processing healthy coping skills for the management of his stressors.  Plan: Return again in 2 weeks.  Diagnosis: Axis I MDD, recurrent episode moderate         MACKENZIE,LISBETH S, LCAS  02/04/17

## 2017-02-11 ENCOUNTER — Ambulatory Visit (HOSPITAL_COMMUNITY): Payer: Self-pay | Admitting: Licensed Clinical Social Worker

## 2017-02-13 ENCOUNTER — Ambulatory Visit (HOSPITAL_COMMUNITY): Payer: Self-pay | Admitting: Psychiatry

## 2017-02-18 ENCOUNTER — Ambulatory Visit (HOSPITAL_COMMUNITY): Payer: Self-pay | Admitting: Licensed Clinical Social Worker

## 2017-02-20 ENCOUNTER — Ambulatory Visit (HOSPITAL_COMMUNITY): Payer: Self-pay | Admitting: Psychiatry

## 2017-02-25 ENCOUNTER — Encounter (HOSPITAL_COMMUNITY): Payer: Self-pay | Admitting: Licensed Clinical Social Worker

## 2017-02-25 ENCOUNTER — Ambulatory Visit (INDEPENDENT_AMBULATORY_CARE_PROVIDER_SITE_OTHER): Payer: Self-pay | Admitting: Licensed Clinical Social Worker

## 2017-02-25 DIAGNOSIS — F331 Major depressive disorder, recurrent, moderate: Secondary | ICD-10-CM

## 2017-02-25 NOTE — Progress Notes (Signed)
   THERAPIST PROGRESS NOTE  Session Time: 11:10-12:00 pm  Participation Level: Active  Behavioral Response: Casual/Alert/Depressed  Type of Therapy: Individual Therapy  Treatment Goals addressed: Coping  Interventions: Supportive  Summary: Mark [name] is a 57 y.o. male. Pt discussed his psychiatric symptoms and current life events. Pt is back from [place] after being stranded there for 2 weeks due to his chair breaking. Gave pt suggestions for his frustrations when dealing with the [facility]. Pt has been setting some goals for 2019, getting his house in order. Assisted pt with problem solving skills. Pt is beginning to reach out asking for help. Validated pt on his choices.     Suicidal/Homicidal: Nowithout intent/plan  Therapist Response: Assessed pt's current functioning and and reviewed progress. Assisted pt processing frustrations, goals setting, problem solving skills, validation.Assisted pt processing healthy coping skills for the management of his stressors.  Plan: Return again in 2 weeks.  Diagnosis: Axis I MDD, recurrent episode moderate         Martise Waddell S, LCAS  02/25/17

## 2017-03-04 ENCOUNTER — Ambulatory Visit (HOSPITAL_COMMUNITY): Payer: Self-pay | Admitting: Licensed Clinical Social Worker

## 2017-03-18 ENCOUNTER — Ambulatory Visit (INDEPENDENT_AMBULATORY_CARE_PROVIDER_SITE_OTHER): Payer: Self-pay | Admitting: Licensed Clinical Social Worker

## 2017-03-18 ENCOUNTER — Encounter (HOSPITAL_COMMUNITY): Payer: Self-pay | Admitting: Licensed Clinical Social Worker

## 2017-03-18 DIAGNOSIS — F331 Major depressive disorder, recurrent, moderate: Secondary | ICD-10-CM

## 2017-03-18 NOTE — Progress Notes (Signed)
   THERAPIST PROGRESS NOTE  Session Time: 11:10-12:00 pm  Participation Level: Active  Behavioral Response: Casual/Alert/Depressed  Type of Therapy: Individual Therapy  Treatment Goals addressed: Coping  Intervention: DBT  Summary: Mark [name] is a 57 y.o. male. Pt discussed his psychiatric symptoms and current life events. Pt reports his anxiety has been increasing, which appears to be manifested from anger and frustration. Taught pt emotional regulation skills for pt's low frustration tolerance. Pt demonstrated his ability to formulate alternative healthier thoughts. Discussed pt's frustration with the [facility] system using his ability to formulate healthier alternative thoughts. Used empathic reflection during the process.       Suicidal/Homicidal: Nowithout intent/plan  Therapist Response: Assessed pt's current functioning and and reviewed progress. Assisted pt processing frustrations, and low frustration tolerance. Taught pt ways to formulate healthier alternative thoughts. Assisted pt processing healthy coping skills for the management of his stressors.  Plan: Return again in 2 weeks.  Diagnosis: Axis I MDD, recurrent episode moderate         Evee Liska S, LCAS  03/18/17

## 2017-03-20 ENCOUNTER — Encounter (HOSPITAL_COMMUNITY): Payer: Self-pay | Admitting: Psychiatry

## 2017-03-20 ENCOUNTER — Ambulatory Visit (INDEPENDENT_AMBULATORY_CARE_PROVIDER_SITE_OTHER): Payer: Self-pay | Admitting: Psychiatry

## 2017-03-20 DIAGNOSIS — R450 Nervousness: Secondary | ICD-10-CM

## 2017-03-20 DIAGNOSIS — M549 Dorsalgia, unspecified: Secondary | ICD-10-CM

## 2017-03-20 DIAGNOSIS — F331 Major depressive disorder, recurrent, moderate: Secondary | ICD-10-CM

## 2017-03-20 DIAGNOSIS — F17210 Nicotine dependence, cigarettes, uncomplicated: Secondary | ICD-10-CM

## 2017-03-20 DIAGNOSIS — F1099 Alcohol use, unspecified with unspecified alcohol-induced disorder: Secondary | ICD-10-CM

## 2017-03-20 DIAGNOSIS — G4700 Insomnia, unspecified: Secondary | ICD-10-CM

## 2017-03-20 DIAGNOSIS — M2550 Pain in unspecified joint: Secondary | ICD-10-CM

## 2017-03-20 DIAGNOSIS — F419 Anxiety disorder, unspecified: Secondary | ICD-10-CM

## 2017-03-20 DIAGNOSIS — F4310 Post-traumatic stress disorder, unspecified: Secondary | ICD-10-CM

## 2017-03-20 MED ORDER — PRAZOSIN HCL 2 MG PO CAPS: 2.0000 mg | ORAL_CAPSULE | Freq: Every day | ORAL | 0 refills | Status: DC

## 2017-03-20 MED ORDER — BUPROPION HCL ER (XL) 300 MG PO TB24: 300.0000 mg | ORAL_TABLET | Freq: Every day | ORAL | 0 refills | Status: DC

## 2017-03-20 MED ORDER — LAMOTRIGINE 25 MG PO TABS: ORAL_TABLET | ORAL | 1 refills | Status: DC

## 2017-03-20 MED ORDER — PRAZOSIN HCL 1 MG PO CAPS: 1.0000 mg | ORAL_CAPSULE | Freq: Every day | ORAL | 0 refills | Status: DC

## 2017-03-20 NOTE — Progress Notes (Signed)
BH MD/PA/NP OP Progress Note  03/20/2017 11:56 AM Mark [name]  MRN:  [id number]  Chief Complaint: I am not doing good.  I think my medicine needs to change.   HPI: Mark [name] came for his follow-up appointment.  He will missed last appointment and he apologized missing the appointment.  He was visiting [place] and then his wheelchair broke down and he was waiting to have it fixed.  He admitted lately irritability, frustration, crying spells easily fatigue and depressed.  He is sleeping on and off.  He is very concerned about his attention, concentration and memory issues.  He easily forgetful and like to have psychological testing.  He is frustrated with his wheelchair which has been brought down multiple times and is hoping to have a new one soon.  He is scheduled to see primary care physician tomorrow.  He also admitted weight gain since he is not watching his diet.  He admitted eating sugar and his last hemoglobin A1c was 10.  He admitted crying spells, emotional, easily irritable and frustrated.  He had a Christmas with his wife and son in [place] but he was very disappointed because he did not receive any gift from them.  He denies drinking alcohol or using any illegal substances.  He does not feel that Cymbalta working.  His energy level is low.  Patient also frustrated because he did not have enough funding to train his dog to get certified as a Administrator, [employer].  He is seeing [name] regularly and feels therapy helping him.  He has nightmares and flashback and recently he noticed racing thoughts and flashback and keep waking up in the middle of the night.  He is taking Minipress 1 mg, Cymbalta 30 mg twice a day and Wellbutrin XL 300 mg daily.  In the past he had tried Prozac and Effexor but stopped working.  Visit Diagnosis:    ICD-10-CM   1. PTSD (post-traumatic stress disorder) F43.10 prazosin (MINIPRESS) 2 MG capsule    DISCONTINUED: prazosin (MINIPRESS) 1 MG capsule  2. Moderate episode of  recurrent major depressive disorder (HCC) F33.1 buPROPion (WELLBUTRIN XL) 300 MG 24 hr tablet    lamoTRIgine (LAMICTAL) 25 MG tablet    Past Psychiatric History: Viewed. Patient reported feeling depressed since mother died in 2007-06-14 and in 06/13/2008 he started having knee pain. He saw psychiatrist at [facility] and given Prozac which he took for many years but stopped working. He was taking Effexor 225 mg but it stopped working.  Recently we tried Cymbalta which she takes 30 mg twice a day but does not feel it is working as much.  Patient denies any history of suicidal attempt or any psychiatric inpatient treatment. He has PTSD and he had excessive treatment at [place]. Patient denies any history of violence or aggression.   Past Medical History:  Past Medical History:  Diagnosis Date  . Above knee amputation status, right (HCC)   . Anxiety   . Depression   . Diabetes mellitus, type II (HCC)   . Shoulder pain     Past Surgical History:  Procedure Laterality Date  . HERNIA REPAIR    . LEG AMPUTATION Right     Family Psychiatric History: Viewed.  Family History:  Family History  Problem Relation Age of Onset  . Aneurysm Mother   . Leukemia Father     Social History:  Social History   Socioeconomic History  . Marital status: Married    Spouse name: None  . Number  of children: None  . Years of education: None  . Highest education level: None  Social Needs  . Financial resource strain: None  . Food insecurity - worry: None  . Food insecurity - inability: None  . Transportation needs - medical: None  . Transportation needs - non-medical: None  Occupational History  . None  Tobacco Use  . Smoking status: Current Every Day Smoker    Packs/day: 1.50    Years: 35.00    Pack years: 52.50    Types: Cigarettes  . Smokeless tobacco: Never Used  . Tobacco comment: Has cut back to 1/2 pack and trying to quit  Substance and Sexual Activity  . Alcohol use: Yes    Alcohol/week: 3.0 oz     Types: 2 Glasses of wine, 2 Shots of liquor, 1 Cans of beer per week  . Drug use: No  . Sexual activity: Not Currently  Other Topics Concern  . None  Social History Narrative  . None    Allergies:  Allergies  Allergen Reactions  . Lyrica [Pregabalin] Rash    Metabolic Disorder Labs: No results found for: HGBA1C, MPG No results found for: PROLACTIN No results found for: CHOL, TRIG, HDL, CHOLHDL, VLDL, LDLCALC No results found for: TSH  Therapeutic Level Labs: No results found for: LITHIUM No results found for: VALPROATE No components found for:  CBMZ  Current Medications: Current Outpatient Medications  Medication Sig Dispense Refill  . atorvastatin (LIPITOR) 80 MG tablet Take 80 mg by mouth daily.    Marland Kitchen. buPROPion (WELLBUTRIN XL) 300 MG 24 hr tablet Take 1 tablet (300 mg total) by mouth daily. 90 tablet 0  . cyclobenzaprine (FLEXERIL) 10 MG tablet Take 10 mg by mouth 3 (three) times daily as needed for muscle spasms.    Marland Kitchen. docusate sodium (COLACE) 50 MG capsule Take 50 mg by mouth 2 (two) times daily.    . DULoxetine (CYMBALTA) 30 MG capsule Take 1 capsule daily for 1 week and than twice daily 180 capsule 0  . insulin aspart protamine- aspart (NOVOLOG MIX 70/30) (70-30) 100 UNIT/ML injection Inject into the skin.    . Insulin Glargine (LANTUS [place]) Inject into the skin.    Marland Kitchen. lisinopril (PRINIVIL,ZESTRIL) 5 MG tablet Take 5 mg by mouth daily.    . metFORMIN (GLUCOPHAGE) 1000 MG tablet Take 1,000 mg by mouth 2 (two) times daily with a meal.    . oxyCODONE-acetaminophen (PERCOCET) 5-325 MG tablet Take by mouth every 4 (four) hours as needed for severe pain.    . prazosin (MINIPRESS) 1 MG capsule Take 1 capsule (1 mg total) by mouth at bedtime. 90 capsule 0  . sildenafil (VIAGRA) 100 MG tablet Take 100 mg by mouth daily as needed for erectile dysfunction.     No current facility-administered medications for this visit.      Musculoskeletal: Strength & Muscle Tone: within normal  limits Gait & Station: patient has right above knee amputation Patient leans: see above  Psychiatric Specialty Exam: Review of Systems  Constitutional: Negative for weight loss.  HENT: Negative.   Cardiovascular: Negative.   Musculoskeletal: Positive for back pain and joint pain.  Skin: Negative.  Negative for itching and rash.  Psychiatric/Behavioral: Positive for depression. The patient is nervous/anxious and has insomnia.     Blood pressure 132/84, pulse (!) 101, height 6\' 2"  (1.88 m), weight 297 lb (134.7 kg).Body mass index is 38.13 kg/m.  General Appearance: Casual  Eye Contact:  Fair  Speech:  Slow  Volume:  Normal  Mood:  Anxious and Dysphoric  Affect:  Constricted and Depressed  Thought Process:  Goal Directed  Orientation:  Full (Time, Place, and Person)  Thought Content: Rumination   Suicidal Thoughts:  No  Homicidal Thoughts:  No  Memory:  Immediate;   Fair Recent;   Fair Remote;   Fair  Judgement:  Good  Insight:  Good  Psychomotor Activity:  Normal  Concentration:  Concentration: Fair and Attention Span: Fair  Recall:  [employer] of Knowledge: Fair  Language: Good  Akathisia:  No  Handed:  Right  AIMS (if indicated): not done  Assets:  Communication Skills Desire for Improvement Housing Resilience Social Support  ADL's:  Intact  Cognition: WNL  Sleep:  Fair   Screenings:   Assessment and Plan: Posttraumatic stress disorder.  Major depressive disorder, recurrent.  Patient is slowly decompensating and having increased irritability, depression, nightmares and poor sleep.  I recommended to try increasing Minipress to 2 mg at bedtime.  I will discontinue Cymbalta after tripping down 30 mg for 1 week and we will try Lamictal 25 mg daily for 1 week and then 50 mg daily.  I explained medication side effect especially rash in that case he need to stop the medication immediately.  Patient like to have psychological testing and we will write a prescription so  he can have testing done at the [place].  Patient is scheduled to see his physician tomorrow morning.  Encouraged to see [name] for therapy.  Continue Wellbutrin XL 300 mg daily.  Recommended to call us back if is any question or any concern.  Discussed safety concerns at any time having active suicidal thoughts or homicidal thought that he need to call 911 or go to local emergency room.  Encourage weight loss and watch his calorie intake.  Discussed healthy lifestyle.  Still waiting records from his primary care physician.  Follow-up in 4 weeks.   [name], MD 03/20/2017, 11:56 AM

## 2017-03-25 ENCOUNTER — Encounter (HOSPITAL_COMMUNITY): Payer: Self-pay | Admitting: Licensed Clinical Social Worker

## 2017-03-25 ENCOUNTER — Ambulatory Visit (INDEPENDENT_AMBULATORY_CARE_PROVIDER_SITE_OTHER): Payer: Self-pay | Admitting: Licensed Clinical Social Worker

## 2017-03-25 DIAGNOSIS — F4310 Post-traumatic stress disorder, unspecified: Secondary | ICD-10-CM

## 2017-03-25 NOTE — Progress Notes (Signed)
   THERAPIST PROGRESS NOTE  Session Time: 11:10-12:00 pm  Participation Level: Active  Behavioral Response: Casual/Alert/Depressed  Type of Therapy: Individual Therapy  Treatment Goals addressed: Coping  Intervention: CBT  Summary: Mark [name] is a 57 y.o. male. Pt discussed his psychiatric symptoms and current life events. Pt reports his anxiety, depression and ptsd symptoms have been increasing. Pt has been using his emotional regulation skills but he is somewhat decompensating. Discussed the possibility of [facility]. Pt is in agreement and already has an app[facility] at [facility] in [place] on Wednesday. The only issue is his dog. Clinician called personal veternary to ask them to keep this vet'[name] dog while he seeks ptsd tx. They agreed to keep dog while pt in 30 day tx, at a very minimal cost. Pt was hopeful at the end of session.       Suicidal/Homicidal: Nowithout intent/plan  Therapist Response: Assessed pt'[name] current functioning and and reviewed progress. Assisted pt processing depressive, anxiety and ptsd symptoms and possible referral into [facility] 30 day program for PTSD. Assisted pt processing healthy coping skills for the management of his stressors.  Plan: Return again in 2 weeks.  Diagnosis: Axis I: PTSD      Mark [name], LCAS  03/25/17

## 2017-04-01 ENCOUNTER — Ambulatory Visit (HOSPITAL_COMMUNITY): Payer: Self-pay | Admitting: Licensed Clinical Social Worker

## 2017-04-08 ENCOUNTER — Encounter (HOSPITAL_COMMUNITY): Payer: Self-pay | Admitting: Licensed Clinical Social Worker

## 2017-04-08 ENCOUNTER — Ambulatory Visit (INDEPENDENT_AMBULATORY_CARE_PROVIDER_SITE_OTHER): Payer: Self-pay | Admitting: Licensed Clinical Social Worker

## 2017-04-08 DIAGNOSIS — F4310 Post-traumatic stress disorder, unspecified: Secondary | ICD-10-CM

## 2017-04-08 NOTE — Progress Notes (Signed)
   THERAPIST PROGRESS NOTE  Session Time: 11:10-12:00 pm  Participation Level: Active  Behavioral Response: Casual/Alert/Depressed  Type of Therapy: Individual Therapy  Treatment Goals addressed: Coping  Intervention: CBT  Summary: Mark [name] is a 57 y.o. male. Pt discussed his psychiatric symptoms and current life events. Pt reports his anxiety, depression and ptsd symptoms continue to remain unstable, good days and bad days. Pt's wife came here for the weekend and she is not a good supportive person for pt. Continue to urge going [facility].  Pt reports his crying has ceased but he continues to have depressive and anxiety. His dog is helping him cope with these feelings.    Suicidal/Homicidal: Nowithout intent/plan  Therapist Response: Assessed pt's current functioning and and reviewed progress. Assisted pt processing depressive, anxiety and ptsd symptoms and possible referral into [facility] 30 day program for PTSD. Assisted pt processing healthy coping skills for the management of his stressors.  Plan: Return again in 2 weeks.  Diagnosis: Axis I: PTSD      Lashanna Angelo S, LCAS  04/08/17

## 2017-04-15 ENCOUNTER — Encounter (HOSPITAL_COMMUNITY): Payer: Self-pay | Admitting: Licensed Clinical Social Worker

## 2017-04-15 ENCOUNTER — Ambulatory Visit (INDEPENDENT_AMBULATORY_CARE_PROVIDER_SITE_OTHER): Payer: Medicare Other | Admitting: Licensed Clinical Social Worker

## 2017-04-15 DIAGNOSIS — F4310 Post-traumatic stress disorder, unspecified: Secondary | ICD-10-CM

## 2017-04-15 NOTE — Progress Notes (Signed)
   THERAPIST PROGRESS NOTE  Session Time: 11:10-12:00 pm  Participation Level: Active  Behavioral Response: Casual/Alert/Depressed  Type of Therapy: Individual Therapy  Treatment Goals addressed: Coping  Intervention: CBT  Summary: Eugenio Hoesric Dinning is a 57 y.o. male. Pt discussed his psychiatric symptoms and current life events. Pt presented sad and depressed today. His wife is visiting for the weekend and they have a contentious relationship. Spent a lot of session in discussion about his marital relationship which consistently adds to his depressive moods. Asked open ended questions with reflective empathy. Pt talked a lot about his feelings and how they affect his choices. Discussed cbt concepts and the thought emotion concept and alternative perspectives. Gave pt information on PTSD [facility], still suggesting pt participate in the program.    Suicidal/Homicidal: Nowithout intent/plan  Therapist Response: Assessed pt's current functioning and and reviewed progress. Assisted pt processing depressive, symptoms, cbt concepts, relationships and [facility]. Assisted pt processing healthy coping skills for the management of his stressors.  Plan: Return again in 2 weeks.  Diagnosis: Axis I: PTSD      Theador Jezewski S, LCAS  04/15/17

## 2017-04-18 ENCOUNTER — Encounter (HOSPITAL_COMMUNITY): Payer: Self-pay | Admitting: Psychiatry

## 2017-04-18 ENCOUNTER — Ambulatory Visit (INDEPENDENT_AMBULATORY_CARE_PROVIDER_SITE_OTHER): Payer: Medicare Other | Admitting: Psychiatry

## 2017-04-18 DIAGNOSIS — F17210 Nicotine dependence, cigarettes, uncomplicated: Secondary | ICD-10-CM

## 2017-04-18 DIAGNOSIS — F4310 Post-traumatic stress disorder, unspecified: Secondary | ICD-10-CM | POA: Diagnosis not present

## 2017-04-18 DIAGNOSIS — F1099 Alcohol use, unspecified with unspecified alcohol-induced disorder: Secondary | ICD-10-CM | POA: Diagnosis not present

## 2017-04-18 DIAGNOSIS — M2550 Pain in unspecified joint: Secondary | ICD-10-CM | POA: Diagnosis not present

## 2017-04-18 DIAGNOSIS — F331 Major depressive disorder, recurrent, moderate: Secondary | ICD-10-CM | POA: Diagnosis not present

## 2017-04-18 DIAGNOSIS — M549 Dorsalgia, unspecified: Secondary | ICD-10-CM | POA: Diagnosis not present

## 2017-04-18 MED ORDER — LAMOTRIGINE 100 MG PO TABS: 100.0000 mg | ORAL_TABLET | Freq: Every day | ORAL | 0 refills | Status: DC

## 2017-04-18 NOTE — Progress Notes (Signed)
BH MD/PA/NP OP Progress Note  04/18/2017 2:01 PM Mark [name]  MRN:  [id number]  Chief Complaint: I like new medication.  But I am still emotional and easily tearful.  HPI: Patient came for his follow-up appointment.  On his last visit we change his medication.  He stopped Cymbalta and try Lamictal.  He is taking 50 mg daily.  He admitted improvement in his mood and crying spells.  However in the beginning when he stopped the Cymbalta he admitted having withdrawal symptoms where he find himself very emotional.  Now he is tolerating Lamictal and he has no rash or itching.  He still have some time anxiety but denies any irritability, anger, mania or any suicidal thoughts.  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  He has a better control on his blood sugar.  His last hemoglobin A1c was 9.8 which dropped from 10.5.  He also taken to start taking Spanish class at [employer].  He is scheduled to take psychological testing on April 11 to rule out ADD.  He had easily forgetful and have poor attention and concentration.  Patient denies drinking alcohol or using any illegal substances.  He is seeing [name] for therapy.  He has nightmares and flashback but they are less intense and less frequent from the past.  He also admitted forgot to take Minipress every night.  Patient denies drinking alcohol or using any illegal substances.  Visit Diagnosis:    ICD-10-CM   1. PTSD (post-traumatic stress disorder) F43.10   2. Moderate episode of recurrent major depressive disorder (HCC) F33.1 lamoTRIgine (LAMICTAL) 100 MG tablet    Past Psychiatric History: Reviewed Patient reported feeling depressed when her mother died in 2007/06/25 and in 2008-06-24 he had knee pain. He saw psychiatrist at [facility] and given Prozac which he took for many years but stopped working. He was given Effexor 225 mg but it also stopped working.  Recently we tried Cymbalta which he takes  30 mg twice a day but does not feel it is working as much.  Patient denies any history of suicidal attempt or any psychiatric inpatient treatment. He has PTSD and he had excessive treatment at [place]. Patient denies any history of violence or aggression.  Past Medical History:  Past Medical History:  Diagnosis Date  . Above knee amputation status, right (HCC)   . Anxiety   . Depression   . Diabetes mellitus, type II (HCC)   . Shoulder pain     Past Surgical History:  Procedure Laterality Date  . HERNIA REPAIR    . LEG AMPUTATION Right     Family Psychiatric History: Reviewed.  Family History:  Family History  Problem Relation Age of Onset  . Aneurysm Mother   . Leukemia Father     Social History:  Social History   Socioeconomic History  . Marital status: Married    Spouse name: Not on file  . Number of children: Not on file  . Years of education: Not on file  . Highest education level: Not on file  Occupational History  . Not on file  Social Needs  . Financial resource strain: Not on file  . Food insecurity:    Worry: Not on file    Inability: Not on file  . Transportation needs:    Medical: Not on file    Non-medical: Not on file  Tobacco Use  . Smoking status: Current Every Day Smoker  Packs/day: 1.50    Years: 35.00    Pack years: 52.50    Types: Cigarettes  . Smokeless tobacco: Never Used  . Tobacco comment: Has cut back to 1/2 pack and trying to quit  Substance and Sexual Activity  . Alcohol use: Yes    Alcohol/week: 3.0 oz    Types: 2 Glasses of wine, 2 Shots of liquor, 1 Cans of beer per week  . Drug use: No  . Sexual activity: Not Currently  Lifestyle  . Physical activity:    Days per week: Not on file    Minutes per session: Not on file  . Stress: Not on file  Relationships  . Social connections:    Talks on phone: Not on file    Gets together: Not on file    Attends religious service: Not on file    Active member of club or  organization: Not on file    Attends meetings of clubs or organizations: Not on file    Relationship status: Not on file  Other Topics Concern  . Not on file  Social History Narrative  . Not on file    Allergies:  Allergies  Allergen Reactions  . Lyrica [Pregabalin] Rash    Metabolic Disorder Labs: No results found for: HGBA1C, MPG No results found for: PROLACTIN No results found for: CHOL, TRIG, HDL, CHOLHDL, VLDL, LDLCALC No results found for: TSH  Therapeutic Level Labs: No results found for: LITHIUM No results found for: VALPROATE No components found for:  CBMZ  Current Medications: Current Outpatient Medications  Medication Sig Dispense Refill  . atorvastatin (LIPITOR) 80 MG tablet Take 80 mg by mouth daily.    Marland Kitchen buPROPion (WELLBUTRIN XL) 300 MG 24 hr tablet Take 1 tablet (300 mg total) by mouth daily. 90 tablet 0  . docusate sodium (COLACE) 50 MG capsule Take 50 mg by mouth 2 (two) times daily.    . insulin aspart protamine- aspart (NOVOLOG MIX 70/30) (70-30) 100 UNIT/ML injection Inject into the skin.    . Insulin Glargine (LANTUS [place]) Inject into the skin.    Marland Kitchen lamoTRIgine (LAMICTAL) 100 MG tablet Take 1 tablet (100 mg total) by mouth daily. 90 tablet 0  . lisinopril (PRINIVIL,ZESTRIL) 5 MG tablet Take 5 mg by mouth daily.    . metFORMIN (GLUCOPHAGE) 1000 MG tablet Take 1,000 mg by mouth 2 (two) times daily with a meal.    . oxyCODONE-acetaminophen (PERCOCET) 5-325 MG tablet Take by mouth every 4 (four) hours as needed for severe pain.    . prazosin (MINIPRESS) 2 MG capsule Take 1 capsule (2 mg total) by mouth at bedtime. 90 capsule 0  . sildenafil (VIAGRA) 100 MG tablet Take 100 mg by mouth daily as needed for erectile dysfunction.     No current facility-administered medications for this visit.      Musculoskeletal: Strength & Muscle Tone: within normal limits Gait & Station: right knee amputation Patient leans: See above  Psychiatric Specialty Exam: Review  of Systems  Musculoskeletal: Positive for back pain and joint pain.    Blood pressure 132/84, pulse (!) 104, height 6\' 1"  (1.854 m), weight 298 lb (135.2 kg).Body mass index is 39.32 kg/m.  General Appearance: Casual  Eye Contact:  Good  Speech:  Clear and Coherent  Volume:  Normal  Mood:  Anxious and Dysphoric  Affect:  Appropriate  Thought Process:  Coherent  Orientation:  Full (Time, Place, and Person)  Thought Content: Logical   Suicidal Thoughts:  No  Homicidal Thoughts:  No  Memory:  Immediate;   Good Recent;   Fair Remote;   Fair  Judgement:  Good  Insight:  Good  Psychomotor Activity:  Normal  Concentration:  Concentration: Fair and Attention Span: Fair  Recall:  Good  Fund of Knowledge: Good  Language: Good  Akathisia:  No  Handed:  Right  AIMS (if indicated): not done  Assets:  Communication Skills Desire for Improvement Housing Resilience Social Support  ADL's:  Intact  Cognition: WNL  Sleep:  Fair   Screenings:   Assessment and Plan: Posttraumatic stress disorder.  Major depressive disorder, recurrent.  Patient doing better on Lamictal.  Recommended to increase Lamictal 100 mg daily.  He has no rash or itching.  Reinforced to take Minipress 2 mg every night which is helping his nightmares and flashback.  Continue Wellbutrin XL 300 mg daily.  Patient is scheduled to have [facility].  Encouraged to keep appointment with Lavada MesiBeth McKenzie for CBT.  Recommended to call us back if he has any question, concern if you feel worsening of the symptoms.  Follow-up in 2 months.   Cleotis NipperSyed T Arfeen, MD 04/18/2017, 2:01 PM

## 2017-05-01 ENCOUNTER — Encounter (HOSPITAL_COMMUNITY): Payer: Self-pay | Admitting: Licensed Clinical Social Worker

## 2017-05-01 ENCOUNTER — Ambulatory Visit (INDEPENDENT_AMBULATORY_CARE_PROVIDER_SITE_OTHER): Payer: Medicare Other | Admitting: Licensed Clinical Social Worker

## 2017-05-01 DIAGNOSIS — F4310 Post-traumatic stress disorder, unspecified: Secondary | ICD-10-CM | POA: Diagnosis not present

## 2017-05-01 NOTE — Progress Notes (Signed)
   THERAPIST PROGRESS NOTE  Session Time: 1:10-2:00 pm  Participation Level: Active  Behavioral Response: Casual/Alert/Euthymic  Type of Therapy: Individual Therapy  Treatment Goals addressed: Coping  Intervention: CBT  Summary: Eugenio Hoesric Taborda is a 57 y.o. male. Pt discussed his psychiatric symptoms and current life events. Pt reported he saw his psychiatrist, Dr. Lolly MustacheArfeen, for medication management and no meds were changed. He seems to be tolerating lamictal well with less crying spell. Pt has been working more on his self care.  He has been looking to buy a conversion [employer] which should help with transportation. [facility] will [facility] of [facility] cost. Discussed vulnerability with pt. Pt struggles with being vulnerable with anyone, including family. Suggested pt watch a Ted Talk on vulnerability. Pt was in agreement. Taught pt breathing exercise when dealing with his stressors.        Suicidal/Homicidal: Nowithout intent/plan  Therapist Response: Assessed pt's current functioning and and reviewed progress. Assisted pt processing depressive, symptoms, medications, self care, vulnerability breathing exercise.  Assisted pt processing healthy coping skills for the management of his stressors.  Plan: Return again in 2 weeks.  Diagnosis: Axis I: PTSD      MACKENZIE,LISBETH S, LCAS  04/15/17

## 2017-05-06 ENCOUNTER — Ambulatory Visit (HOSPITAL_COMMUNITY): Payer: Self-pay | Admitting: Licensed Clinical Social Worker

## 2017-05-13 ENCOUNTER — Ambulatory Visit (HOSPITAL_COMMUNITY): Payer: Self-pay | Admitting: Licensed Clinical Social Worker

## 2017-05-13 ENCOUNTER — Ambulatory Visit (INDEPENDENT_AMBULATORY_CARE_PROVIDER_SITE_OTHER): Payer: Medicare Other | Admitting: Licensed Clinical Social Worker

## 2017-05-13 ENCOUNTER — Encounter (HOSPITAL_COMMUNITY): Payer: Self-pay | Admitting: Licensed Clinical Social Worker

## 2017-05-13 DIAGNOSIS — F4310 Post-traumatic stress disorder, unspecified: Secondary | ICD-10-CM
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
## 2017-05-20 ENCOUNTER — Encounter (HOSPITAL_COMMUNITY): Payer: Self-pay | Admitting: Licensed Clinical Social Worker

## 2017-05-20 ENCOUNTER — Ambulatory Visit (HOSPITAL_COMMUNITY): Payer: Self-pay | Admitting: Licensed Clinical Social Worker

## 2017-05-20 ENCOUNTER — Ambulatory Visit (INDEPENDENT_AMBULATORY_CARE_PROVIDER_SITE_OTHER): Payer: Medicare Other | Admitting: Licensed Clinical Social Worker

## 2017-05-20 DIAGNOSIS — F331 Major depressive disorder, recurrent, moderate: Secondary | ICD-10-CM | POA: Diagnosis not present

## 2017-05-20 NOTE — Progress Notes (Signed)
   THERAPIST PROGRESS NOTE  Session Time: 10:10-11:00am  Participation Level: Active  Behavioral Response: Casual/Alert/Euthymic  Type of Therapy: Individual Therapy  Treatment Goals addressed: Coping  Intervention: CBT  Summary: Mark [name] is a 57 y.o. male. Pt discussed his psychiatric symptoms and current life events.Pt reports his meds are continuing to work and he feels less depressed. Pt continues to work with a group that assists veteran'[name] needs. He feels it brings him purpose. Discussed feelings/emotions with pt and his inability to talk about them. Asked open ended questions and used empathic reflection. Pt shared he is able to cry at home when he is alone is the only time he shows his emotions. Asked open ended questions. Pt is going to [place] 5/21 for his psychological testing for ADHD.      Suicidal/Homicidal: Nowithout intent/plan  Therapist Response: Assessed pt'[name] current functioning and and reviewed progress. Assisted pt processing, emotions, self care, vulnerability.  Assisted pt processing healthy coping skills for the management of his stressors.  Plan: Return again in 2 weeks.  Diagnosis: Axis I:MDD, recurrent episode moderate     Mark [name], LCAS  05/20/17

## 2017-05-27 ENCOUNTER — Ambulatory Visit (INDEPENDENT_AMBULATORY_CARE_PROVIDER_SITE_OTHER): Payer: Medicare Other | Admitting: Licensed Clinical Social Worker

## 2017-05-27 ENCOUNTER — Ambulatory Visit (HOSPITAL_COMMUNITY): Payer: Self-pay | Admitting: Licensed Clinical Social Worker

## 2017-05-27 ENCOUNTER — Encounter (HOSPITAL_COMMUNITY): Payer: Self-pay | Admitting: Licensed Clinical Social Worker

## 2017-05-27 DIAGNOSIS — F331 Major depressive disorder, recurrent, moderate: Secondary | ICD-10-CM | POA: Diagnosis not present

## 2017-05-27 NOTE — Progress Notes (Signed)
   THERAPIST PROGRESS NOTE  Session Time: 1:10-2:00pm  Participation Level: Active  Behavioral Response: Casual/Alert/Euthymic  Type of Therapy: Individual Therapy  Treatment Goals addressed: Coping  Intervention: CBT  Summary: Mark [name] is a 57 y.o. male. Pt discussed his psychiatric symptoms and current life events.Pt reports his meds are continuing to work. He has some less depressed days.  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 He feels it brings him purpose. Pt went home over the weekend to attend an amputee coalition meeting in [place]. His wife wasn't there so it was quiet and peaceful. He got to spend some quality time with his son. He is realizing the relationship with his children is not what he wants it to be. Discussed parenting with adult children. Pt becomes discouraged in talking about them. Asked open ended questions and used empathic reflection. Discussed pt's new normal, a term he continues to hear in his amputee groups. Pt is experiencing phantom pain due to his increased stress, depression and fatigue. He goes to the [place] hospital this week and will discuss the increased phantom pain with his doc.         Suicidal/Homicidal: Nowithout intent/plan  Therapist Response: Assessed pt's current functioning and and reviewed progress. Assisted pt processing, adult children, feelings, phantom pain,  vulnerability.  Assisted pt processing healthy coping skills for the management of his stressors.  Plan: Return again in 2 weeks.  Diagnosis: Axis I:MDD, recurrent episode moderate     MACKENZIE,LISBETH S, LCAS  05/27/17

## 2017-06-03 ENCOUNTER — Encounter (HOSPITAL_COMMUNITY): Payer: Self-pay | Admitting: Licensed Clinical Social Worker

## 2017-06-03 ENCOUNTER — Ambulatory Visit (HOSPITAL_COMMUNITY): Payer: Self-pay | Admitting: Licensed Clinical Social Worker

## 2017-06-03 ENCOUNTER — Ambulatory Visit (INDEPENDENT_AMBULATORY_CARE_PROVIDER_SITE_OTHER): Payer: Medicare Other | Admitting: Licensed Clinical Social Worker

## 2017-06-03 DIAGNOSIS — F331 Major depressive disorder, recurrent, moderate: Secondary | ICD-10-CM | POA: Diagnosis not present
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
## 2017-06-10 ENCOUNTER — Ambulatory Visit (HOSPITAL_COMMUNITY): Payer: Self-pay | Admitting: Licensed Clinical Social Worker

## 2017-06-10 ENCOUNTER — Ambulatory Visit (HOSPITAL_COMMUNITY): Payer: Medicare Other | Admitting: Licensed Clinical Social Worker

## 2017-06-17 ENCOUNTER — Ambulatory Visit (HOSPITAL_COMMUNITY): Payer: Self-pay | Admitting: Licensed Clinical Social Worker

## 2017-06-19 ENCOUNTER — Ambulatory Visit (INDEPENDENT_AMBULATORY_CARE_PROVIDER_SITE_OTHER): Payer: Medicare Other | Admitting: Licensed Clinical Social Worker

## 2017-06-19 ENCOUNTER — Encounter (HOSPITAL_COMMUNITY): Payer: Self-pay | Admitting: Licensed Clinical Social Worker

## 2017-06-19 DIAGNOSIS — F4310 Post-traumatic stress disorder, unspecified: Secondary | ICD-10-CM | POA: Diagnosis not present

## 2017-06-19 NOTE — Progress Notes (Signed)
   THERAPIST PROGRESS NOTE  Session Time: 10:10-11am  Participation Level: Active  Behavioral Response: Casual/Alert/Euthymic  Type of Therapy: Individual Therapy  Treatment Goals addressed: Coping  Intervention: CBT  Summary: Mark [name] is a 57 y.o. male. Pt presents for his individual counseling session with his service dog. It was apparent the dog loves him unconditionally. And he loves the dog the same way. The dog assisted pt in his wheel chair. Asked open ended questions and used empathic reflection. Pt discussed his psychiatric symptoms and current life events.Pt reports his meds are continuing to work. Pt was approved for 1x per week in home care. He also met with the psychiatrist at the [facility] to go over his meds, nothing changed. Pt also has started the process for the prosthetic leg. Asked open ended questions.  [facility] he [facility]. Discussed vulnerability with pt. His children continue to ignore him and don't call or visit him. He talks about how it makes him feel but won't talk to them about it. Role played vulnerability with pt and how to talk with them. He shows them anger not his real feelings. Processed this with pt. Discussed a group for veterans that may begin in 2 weeks. Pt expressed interest in the group.        Suicidal/Homicidal: Nowithout intent/plan  Therapist Response: Assessed pt's current functioning and and reviewed progress. Assisted pt processing, adult children, feelings, new group, prosthetic leg,  vulnerability.  Assisted pt processing healthy coping skills for the management of his stressors.  Plan: Return again in 2 weeks.  Diagnosis: Axis I:MDD, recurrent episode moderate     MACKENZIE,LISBETH S, LCAS  06/19/17

## 2017-06-20 ENCOUNTER — Encounter (HOSPITAL_COMMUNITY): Payer: Self-pay | Admitting: Psychiatry

## 2017-06-20 ENCOUNTER — Ambulatory Visit (INDEPENDENT_AMBULATORY_CARE_PROVIDER_SITE_OTHER): Payer: Medicare Other | Admitting: Psychiatry

## 2017-06-20 ENCOUNTER — Ambulatory Visit (HOSPITAL_COMMUNITY): Payer: No Typology Code available for payment source | Admitting: Psychiatry

## 2017-06-20 DIAGNOSIS — F4310 Post-traumatic stress disorder, unspecified: Secondary | ICD-10-CM | POA: Diagnosis not present

## 2017-06-20 DIAGNOSIS — F331 Major depressive disorder, recurrent, moderate: Secondary | ICD-10-CM

## 2017-06-20 MED ORDER — LAMOTRIGINE 100 MG PO TABS: 100.0000 mg | ORAL_TABLET | Freq: Every day | ORAL | 0 refills | Status: DC

## 2017-06-20 MED ORDER — BUPROPION HCL ER (XL) 300 MG PO TB24
300.0000 mg | ORAL_TABLET | Freq: Every day | ORAL | 0 refills | Status: DC
Start: 2017-06-20 — End: 2019-05-14

## 2017-06-20 MED ORDER — PRAZOSIN HCL 2 MG PO CAPS: 2.0000 mg | ORAL_CAPSULE | Freq: Every day | ORAL | 0 refills | Status: DC

## 2017-06-20 NOTE — Progress Notes (Signed)
BH MD/PA/NP OP Progress Note  06/20/2017 1:44 PM Mark [name]  MRN:  [id number]  Chief Complaint: I am still waiting for my psychological testing for ADD.  I am sleeping better.  HPI: Patient came for his follow-up appointment.  He is taking Lamictal 100 mg along with Cymbalta and Minipress.  He is sleeping better.  He has no more nightmares and flashback.  However he still struggled with attention concentration and multitasking.  [facility].  He denies any crying spells, feeling of hopelessness or worthlessness but he still gets very emotional and tearful.  Especially when he watch movies he gets very tearful.  He started rehab and hoping to have his prosthesis so he can walk on his leg.  He is seeing [name] for therapy.  He continues to engage in Personal assistant.  He was taking a Spanish classes but he was disappointed about the expectations.  Patient like to continue his current psychiatric medication.  He denies any paranoia, hallucination or any aggressive behavior.  Patient denies drinking or using any illegal substances.  Visit Diagnosis:    ICD-10-CM   1. PTSD (post-traumatic stress disorder) F43.10 prazosin (MINIPRESS) 2 MG capsule  2. Moderate episode of recurrent major depressive disorder (HCC) F33.1 lamoTRIgine (LAMICTAL) 100 MG tablet    buPROPion (WELLBUTRIN XL) 300 MG 24 hr tablet    Past Psychiatric History: Reviewed. Patient had history of depression when her mother died in 06-21-2007 and in 20-Jun-2008 he had knee pain. He saw psychiatrist at [facility] and given Prozac which he took for many years but stopped working. He was given  Effexor up to 225 mg but it also stopped working. Recently we tried Cymbalta which he takes 30 mg twice a day but does not feel it is working as much. Patient denies any history of suicidal attempt or any psychiatric inpatient treatment. He has PTSD and he had excessive  treatment at [place]. Patient denies any history of violence or aggression.  Past Medical History:  Past Medical History:  Diagnosis Date  . Above knee amputation status, right (HCC)   . Anxiety   . Depression   . Diabetes mellitus, type II (HCC)   . Shoulder pain     Past Surgical History:  Procedure Laterality Date  . HERNIA REPAIR    . LEG AMPUTATION Right     Family Psychiatric History: Reviewed.  Family History:  Family History  Problem Relation Age of Onset  . Aneurysm Mother   . Leukemia Father     Social History:  Social History   Socioeconomic History  . Marital status: Married    Spouse name: Not on file  . Number of children: Not on file  . Years of education: Not on file  . Highest education level: Not on file  Occupational History  . Not on file  Social Needs  . Financial resource strain: Not on file  . Food insecurity:    Worry: Not on file    Inability: Not on file  . Transportation needs:    Medical: Not on file    Non-medical: Not on file  Tobacco Use  . Smoking status: Current Every Day Smoker    Packs/day: 1.50    Years: 35.00    Pack years: 52.50    Types: Cigarettes  . Smokeless tobacco: Never Used  . Tobacco comment: Has cut back to 1/2 pack and trying to quit  Substance and Sexual Activity  . Alcohol use: Yes    Alcohol/week: 3.0 oz    Types: 2 Glasses of wine, 2 Shots of liquor, 1 Cans of beer per week  . Drug use: No  . Sexual activity: Not Currently  Lifestyle  . Physical activity:    Days per week: Not on file    Minutes per session: Not on file  . Stress: Not on file  Relationships  . Social connections:    Talks on phone: Not on file    Gets together: Not on file    Attends religious service: Not on file    Active member of club or organization: Not on file    Attends meetings of clubs or organizations: Not on file    Relationship status: Not on file  Other Topics Concern  . Not on file  Social History Narrative  .  Not on file    Allergies:  Allergies  Allergen Reactions  . Lyrica [Pregabalin] Rash    Metabolic Disorder Labs: No results found for: HGBA1C, MPG No results found for: PROLACTIN No results found for: CHOL, TRIG, HDL, CHOLHDL, VLDL, LDLCALC No results found for: TSH  Therapeutic Level Labs: No results found for: LITHIUM No results found for: VALPROATE No components found for:  CBMZ  Current Medications: Current Outpatient Medications  Medication Sig Dispense Refill  . atorvastatin (LIPITOR) 80 MG tablet Take 80 mg by mouth daily.    Marland Kitchen buPROPion (WELLBUTRIN XL) 300 MG 24 hr tablet Take 1 tablet (300 mg total) by mouth daily. 90 tablet 0  . docusate sodium (COLACE) 50 MG capsule Take 50 mg by mouth 2 (two) times daily.    . insulin aspart protamine- aspart (NOVOLOG MIX 70/30) (70-30) 100 UNIT/ML injection Inject into the skin.    . Insulin Glargine (LANTUS [place]) Inject into the skin.    Marland Kitchen lamoTRIgine (LAMICTAL) 100 MG tablet Take 1 tablet (100 mg total) by mouth daily. 90 tablet 0  . lisinopril (PRINIVIL,ZESTRIL) 5 MG tablet Take 5 mg by mouth daily.    . metFORMIN (GLUCOPHAGE) 1000 MG tablet Take 1,000 mg by mouth 2 (two) times daily with a meal.    . oxyCODONE-acetaminophen (PERCOCET) 5-325 MG tablet Take by mouth every 4 (four) hours as needed for severe pain.    . prazosin (MINIPRESS) 2 MG capsule Take 1 capsule (2 mg total) by mouth at bedtime. 90 capsule 0  . sildenafil (VIAGRA) 100 MG tablet Take 100 mg by mouth daily as needed for erectile dysfunction.     No current facility-administered medications for this visit.      Musculoskeletal: Strength & Muscle Tone: within normal limits Gait & Station: right knee amputation Patient leans: see above  Psychiatric Specialty Exam: ROS  Blood pressure (!) 149/80, pulse (!) 118, height  (1.854 m), weight (!) 304 lb (137.9 kg), SpO2 97 %.There is no height or weight on file to calculate BMI.  General Appearance: Casual   Eye Contact:  Good  Speech:  Clear and Coherent  Volume:  Normal  Mood:  emotional  Affect:  Congruent  Thought Process:  Goal Directed  Orientation:  Full (Time, Place, and Person)  Thought Content: Rumination   Suicidal Thoughts:  No  Homicidal Thoughts:  No  Memory:  Immediate;   Fair Recent;   Fair Remote;   Fair  Judgement:  Good  Insight:  Good  Psychomotor Activity:  Normal  Concentration:  Concentration: Fair and Attention Span: Fair  Recall:  Good  Fund of Knowledge: Good  Language: Good  Akathisia:  No  Handed:  Right  AIMS (if indicated): not done  Assets:  Communication Skills Desire for Improvement Housing Resilience Social Support  ADL's:  Intact  Cognition: WNL  Sleep:  improved   Screenings: GAD-7     Counselor from 06/03/2017 in BEHAVIORAL [facility] OUTPATIENT THERAPY [place]  Total GAD-7 Score  12    PHQ2-9     Counselor from 06/03/2017 in BEHAVIORAL HEALTH OUTPATIENT THERAPY [place]  PHQ-2 Total Score  6  PHQ-9 Total Score  19       Assessment and Plan: Posttraumatic stress disorder.  Major depressive disorder, recurrent.  [facility] as [facility].  He is hoping to have testing on July 20.  He like to continue his current psychiatric medication since it is working.  Continue Minipress 2 mg at bedtime, Wellbutrin XL 300 mg daily, Lamictal 100 mg daily.  Recommended to call us back if he has any question or any concern.  Continue therapy with [name] for CBT.  Follow-up in 3 months   [name], MD 06/20/2017, 1:44 PM

## 2017-07-03 ENCOUNTER — Ambulatory Visit (INDEPENDENT_AMBULATORY_CARE_PROVIDER_SITE_OTHER): Payer: Medicare HMO | Admitting: Licensed Clinical Social Worker

## 2017-07-03 ENCOUNTER — Encounter (HOSPITAL_COMMUNITY): Payer: Self-pay | Admitting: Licensed Clinical Social Worker

## 2017-07-03 DIAGNOSIS — F331 Major depressive disorder, recurrent, moderate: Secondary | ICD-10-CM | POA: Diagnosis not present

## 2017-07-03 NOTE — Progress Notes (Signed)
   THERAPIST PROGRESS NOTE  Session Time: 1:10-2pm  Participation Level: Active  Behavioral Response: Casual/Alert/Anxious  Type of Therapy: Individual Therapy  Treatment Goals addressed: Coping  Intervention: CBT  Summary: Mark [name] is a 57 y.o. male. Pt presents for his individual counseling session. Pt discussed his psychiatric symptoms and current life events. Pt met with his psychiatrist and no medications were changed. Pt has a pending psychiatrist appt at the [facility] [place] as well who still has to monitor his progress. Pt has also signed up for a Education officer, museum at [place] who will assist him with his needs at the [place]. Previously we had discussed pt going inpt for PTSD at [place] in [place]. Pt toured the facility and met with a doc there. He will have to see a PCP at the [place] who can refer him to the facility. Asked open ended questions and used empathic reflection. Pt went to [place] with his wife to visit their daughter who is in college there. Pt reports the visit went well and he was able to talk with both of them about expectations. Pt felt heard by both of them. Asked open ended questions. Pt tried his prosthetic leg but it didn't fit well so he has to go back to the [place] to get it fitted again, another appointment. Discussed pt's frustrations.         Suicidal/Homicidal: Nowithout intent/plan  Therapist Response: Assessed pt's current functioning and and reviewed progress. Assisted pt processing inpt facility, family relationships, prosthetic leg, frustrations.  Assisted pt processing healthy coping skills for the management of his stressors.  Plan: Return again in 2 weeks.  Diagnosis: Axis I:MDD, recurrent episode moderate     MACKENZIE,LISBETH S, LCAS  07/03/17

## 2017-07-17 ENCOUNTER — Ambulatory Visit (HOSPITAL_COMMUNITY): Payer: Medicare Other | Admitting: Licensed Clinical Social Worker

## 2017-07-24 ENCOUNTER — Encounter (HOSPITAL_COMMUNITY): Payer: Self-pay | Admitting: Licensed Clinical Social Worker

## 2017-07-24 ENCOUNTER — Ambulatory Visit (INDEPENDENT_AMBULATORY_CARE_PROVIDER_SITE_OTHER): Payer: Medicare HMO | Admitting: Licensed Clinical Social Worker

## 2017-07-24 DIAGNOSIS — F4310 Post-traumatic stress disorder, unspecified: Secondary | ICD-10-CM

## 2017-07-24 NOTE — Progress Notes (Signed)
   THERAPIST PROGRESS NOTE  Session Time: 1:10-2pm  Participation Level: Active  Behavioral Response: Casual/Alert/Euthymic  Type of Therapy: Individual Therapy  Treatment Goals addressed: Coping  Intervention: CBT  Summary: Mark [name] is a 57 y.o. male. Pt presents for his individual counseling session. Pt discussed his psychiatric symptoms and current life events. Pt presented upbeat today. He feel that his moods have stabilized and he is seeing the "fruits of his labor." Asked open ended questions. Pt has begun using more boundaries with his family, which appear to make his life less stressful. Asked open ended questions. Discussed with pt his prosthetic leg. He has been having a rash so he stopped wearing the sleeve, first step for prosthesis. Asked open ended questions. Suggested pt continue to use his mindfulness skills for his stressors.   Suicidal/Homicidal: Nowithout intent/plan  Therapist Response: Assessed pt's current functioning and and reviewed progress. Assisted pt processing moods, boundaries, family relationships, prosthetic leg.  Assisted pt processing healthy coping skills for the management of his stressors.  Plan: Return again in 2 weeks.  Diagnosis: Axis I:MDD, recurrent episode moderate     MACKENZIE,LISBETH S, LCAS  07/24/17

## 2017-07-31 ENCOUNTER — Ambulatory Visit (INDEPENDENT_AMBULATORY_CARE_PROVIDER_SITE_OTHER): Payer: Medicare HMO | Admitting: Licensed Clinical Social Worker

## 2017-07-31 DIAGNOSIS — F331 Major depressive disorder, recurrent, moderate: Secondary | ICD-10-CM | POA: Diagnosis not present

## 2017-08-05 ENCOUNTER — Ambulatory Visit (INDEPENDENT_AMBULATORY_CARE_PROVIDER_SITE_OTHER): Payer: Medicare HMO | Admitting: Licensed Clinical Social Worker

## 2017-08-05 ENCOUNTER — Encounter (HOSPITAL_COMMUNITY): Payer: Self-pay | Admitting: Licensed Clinical Social Worker

## 2017-08-05 DIAGNOSIS — F331 Major depressive disorder, recurrent, moderate: Secondary | ICD-10-CM
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
## 2017-08-06 ENCOUNTER — Encounter (HOSPITAL_COMMUNITY): Payer: Self-pay | Admitting: Licensed Clinical Social Worker

## 2017-08-06 NOTE — Progress Notes (Signed)
Daily Group Progress Note  Program: OP Evening Group (Veterans)   Group Time: 5:30-6:30pm  Participation Level: Active  Behavioral Response: Appropriate  Type of Therapy:  Psychoeducation/Therapy  Summary of Progress: Today began the introductory group for veterans. 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 Pt was an active participant, shared what led him into therapy: depression. 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  Mark StainLisbeth Mackenzie, LCAS

## 2017-08-12 ENCOUNTER — Ambulatory Visit (INDEPENDENT_AMBULATORY_CARE_PROVIDER_SITE_OTHER): Payer: Medicare HMO | Admitting: Licensed Clinical Social Worker

## 2017-08-12 DIAGNOSIS — F331 Major depressive disorder, recurrent, moderate: Secondary | ICD-10-CM

## 2017-08-13 ENCOUNTER — Ambulatory Visit (HOSPITAL_COMMUNITY): Payer: Medicare Other | Admitting: Licensed Clinical Social Worker

## 2017-08-13 ENCOUNTER — Encounter (HOSPITAL_COMMUNITY): Payer: Self-pay | Admitting: Licensed Clinical Social Worker

## 2017-08-13 NOTE — Progress Notes (Signed)
Daily Group Progress Note  Program: OP Evening Group (Veterans)   Group Time: 5:30-6:30pm  Participation Level: Active  Behavioral Response: Appropriate  Type of Therapy:  Psychoeducation/Therapy  Summary of Progress: Pt participated in a discussion on the feeling of anger. Anger management involves a range of skills that can help with recognizing the signs of anger and handling triggers in a positive way. It requires a person to identify anger at an early stage and to express their needs while remaining calm and in control. 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 He also shared the repercussions of his lack of control. Pt was given feedback from other members of the group. Pt was encouraged to use more positive anger management skills.  Ottis StainLisbeth , LCAS

## 2017-08-14 ENCOUNTER — Ambulatory Visit (INDEPENDENT_AMBULATORY_CARE_PROVIDER_SITE_OTHER): Payer: Medicare HMO | Admitting: Licensed Clinical Social Worker

## 2017-08-14 ENCOUNTER — Encounter (HOSPITAL_COMMUNITY): Payer: Self-pay | Admitting: Licensed Clinical Social Worker

## 2017-08-14 DIAGNOSIS — F331 Major depressive disorder, recurrent, moderate: Secondary | ICD-10-CM

## 2017-08-14 NOTE — Progress Notes (Signed)
   THERAPIST PROGRESS NOTE  Session Time: 1:10-2pm  Participation Level: Active  Behavioral Response: Casual/Alert/Anxious  Type of Therapy: Individual Therapy  Treatment Goals addressed: Coping  Intervention: CBT  Summary: Mark [name] is a 57 y.o. male. Pt presents for his individual counseling session. Pt discussed his psychiatric symptoms and current life events. Pt presented anxious today. Pt reports his meds are working ok but he is agitated today. His agitation is making his anxiety increase today. 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 He and another volunteer had an argument and pt wants to resign from the group. Asked pt to focus on the advantages and disadvantages of continuing on the committee. Asked open ended questions and discussed external stimuli. Pt decided to ask some of the other veterans on the committee for suggestions too. Pt reports he and his wife have been having more serious conversations. Role played with pt what to say when he gets frustrated with his wife. Pt was open to the role play. Pt continues to probate his aunt's will which has been difficult. Discussed his feelings around his aunt's passing. Pt continues to work on his self care: exercising, nails done, haircuts. Pt will continue in individual therapy and veteran's group therapy.    Suicidal/Homicidal: Nowithout intent/plan  Therapist Response: Assessed pt's current functioning and and reviewed progress. Assisted pt processing external stimuli, self care family relationships.  Assisted pt processing healthy coping skills for the management of his stressors.  Plan: Return again in 2 weeks for individual therapy and OP group weekly  Diagnosis: Axis I:MDD, recurrent episode moderate     Yolanda Dockendorf S, LCAS  08/14/17

## 2017-08-19 ENCOUNTER — Ambulatory Visit (HOSPITAL_COMMUNITY): Payer: Self-pay | Admitting: Licensed Clinical Social Worker

## 2017-08-26 ENCOUNTER — Encounter (HOSPITAL_COMMUNITY): Payer: Self-pay

## 2017-08-28 ENCOUNTER — Ambulatory Visit (HOSPITAL_COMMUNITY): Payer: Medicare Other | Admitting: Licensed Clinical Social Worker

## 2017-08-28 ENCOUNTER — Encounter (HOSPITAL_COMMUNITY): Payer: Self-pay | Admitting: Licensed Clinical Social Worker

## 2017-08-28 ENCOUNTER — Ambulatory Visit (INDEPENDENT_AMBULATORY_CARE_PROVIDER_SITE_OTHER): Payer: Medicare HMO | Admitting: Licensed Clinical Social Worker

## 2017-08-28 DIAGNOSIS — F331 Major depressive disorder, recurrent, moderate: Secondary | ICD-10-CM

## 2017-08-28 NOTE — Progress Notes (Signed)
   THERAPIST PROGRESS NOTE  Session Time: 1:10-2pm  Participation Level: Active  Behavioral Response: Casual/Alert/Anxious  Type of Therapy: Individual Therapy  Treatment Goals addressed: Coping  Intervention: CBT  Summary: Mark [name] is a 57 y.o. male. Pt presents for his individual counseling session. Pt discussed his psychiatric symptoms and current life events. Pt presented anxious, frustrated and angry. He has just driven in from [place], visiting his wife and 2 adult children. While there they had a family meeting. It did not go well and everyone yelled at each other. He is frustrated with his wife, ongoing and says, "im finished with the marriage." Processed this with pt. Asked open ended questions and used empathic reflection. Allowed pt to vent his feelings and frustrations. Taught pt relationship circles and who he can allow in his circle and why. Discussed with pt how he continues to internalize his feelings, and how that affects his moods. Discussed mindfulness, being in the present. Pt appeared much calmer at end of session, and felt heard.         Suicidal/Homicidal: Nowithout intent/plan  Therapist Response: Assessed pt's current functioning and and reviewed progress. Assisted pt processing external stimuli, self care, family relationships, relationship circles, mindfulness.  Assisted pt processing healthy coping skills for the management of his stressors.  Plan: Return again in 2 weeks for individual therapy and OP group weekly  Diagnosis: Axis I:MDD, recurrent episode moderate     Keisi Eckford S, LCAS  08/28/17

## 2017-09-02 ENCOUNTER — Ambulatory Visit (INDEPENDENT_AMBULATORY_CARE_PROVIDER_SITE_OTHER): Payer: Medicare HMO | Admitting: Licensed Clinical Social Worker

## 2017-09-02 DIAGNOSIS — F331 Major depressive disorder, recurrent, moderate: Secondary | ICD-10-CM

## 2017-09-03 ENCOUNTER — Ambulatory Visit (INDEPENDENT_AMBULATORY_CARE_PROVIDER_SITE_OTHER): Payer: Medicare HMO | Admitting: Licensed Clinical Social Worker

## 2017-09-03 ENCOUNTER — Encounter (HOSPITAL_COMMUNITY): Payer: Self-pay | Admitting: Licensed Clinical Social Worker

## 2017-09-03 DIAGNOSIS — F331 Major depressive disorder, recurrent, moderate: Secondary | ICD-10-CM | POA: Diagnosis not present

## 2017-09-03 NOTE — Progress Notes (Signed)
   THERAPIST PROGRESS NOTE  Session Time: 11:10-12pm  Participation Level: Active  Behavioral Response: Casual/Alert/Sad  Type of Therapy: Individual Therapy  Treatment Goals addressed:Improve psychiatric symptoms, Controlled Behavior, Moderated Mood, Improve Unhelpful Thought Patterns, Emotional Regulation Skills (Moderate moods, anger management, stress management), Feel and express a full Range of Emotions, Learn about Diagnosis, Healthy Coping Skills, Recall the Traumatic event without being overwhelmed      Intervention: CBT  Summary: Mark [name] is a 57 y.o. male. Pt presents for his individual counseling session. Pt discussed his psychiatric symptoms and current life events. Pt presents for his individual session sad today. He has decided to move forward in separation from his wife. This has been a long time in motion, since before [place] moved [place]. He shared his feelings about the marriage. Had pt recall parts of his marriage and actually feel the feelings. He showed anger, frustration, sadness in his description of his marriage. He has a telephone appt with an attorney on Friday just to ask questions. He imagined in session what his future could look like. He shared he has not felt happy in so long, he forgot what it feels like. For his future, he wants peace of mind. Processed what that looks like to pt.            Suicidal/Homicidal: Nowithout intent/plan  Therapist Response: Assessed pt's current functioning and and reviewed progress. Assisted pt processing  family relationships, separation from wife.  Assisted pt processing healthy coping skills for the management of his stressors.  Plan: Return again in 2 weeks for individual therapy and OP group weekly  Diagnosis: Axis I:MDD, recurrent episode moderate     Nykira Reddix S, LCAS  09/03/17

## 2017-09-03 NOTE — Progress Notes (Signed)
Daily Group Progress Note Program:  OP Evening Group (Veterans)   Group Time: 5:30-6:30pm  Participation Level: Active  Behavioral Response: Appropriate  Type of Therapy:  Psychoeducation/Group Process  Summary of Progress: Pt participated in a discussion on relationships and boundaries with family members. Pt shared the relationship with his wife is currently strained. Pt received feedback from another group member.  Pt was encouraged to continue to use boundaries with people in his life for healthy self-care.   Mark StainLisbeth Mackenzie, LCAS

## 2017-09-04 ENCOUNTER — Encounter (HOSPITAL_COMMUNITY): Payer: Self-pay

## 2017-09-09 ENCOUNTER — Ambulatory Visit (INDEPENDENT_AMBULATORY_CARE_PROVIDER_SITE_OTHER): Payer: Medicare HMO | Admitting: Licensed Clinical Social Worker

## 2017-09-09 DIAGNOSIS — F331 Major depressive disorder, recurrent, moderate: Secondary | ICD-10-CM | POA: Diagnosis not present

## 2017-09-10 ENCOUNTER — Encounter (HOSPITAL_COMMUNITY): Payer: Self-pay | Admitting: Licensed Clinical Social Worker

## 2017-09-10 NOTE — Progress Notes (Signed)
Daily Group Progress Note       Program: Vetereans OP Evening                            Group    Group Time: 5:30-6:30pm  Participation Level: Active  Behavioral Response: Appropriate  Type of Therapy:  Psychoeducation/Therapy  Summary of Progress: Pt participated in a discussion on unhealthy relationships where the group discussed fair fighting, what a healthy relationship entails, and how to move from an unhealthy to a healthy relationship. Pt was encouraged to think about what he wants out of his relationship before terminating his marriage.  Vernona RiegerLisbeth S. Christianjames Soule, LCAS

## 2017-09-11 ENCOUNTER — Ambulatory Visit (INDEPENDENT_AMBULATORY_CARE_PROVIDER_SITE_OTHER): Payer: Medicare HMO | Admitting: Licensed Clinical Social Worker

## 2017-09-11 ENCOUNTER — Encounter (HOSPITAL_COMMUNITY): Payer: Self-pay | Admitting: Licensed Clinical Social Worker

## 2017-09-11 DIAGNOSIS — F331 Major depressive disorder, recurrent, moderate: Secondary | ICD-10-CM

## 2017-09-11 NOTE — Progress Notes (Signed)
   THERAPIST PROGRESS NOTE  Session Time: 1:10-2pm  Participation Level: Active  Behavioral Response: Casual/Alert/Depressed  Type of Therapy: Individual Therapy  Treatment Goals addressed:Improve psychiatric symptoms, Controlled Behavior, Moderated Mood, Improve Unhelpful Thought Patterns, Emotional Regulation Skills (Moderate moods, anger management, stress management), Feel and express a full Range of Emotions, Learn about Diagnosis, Healthy Coping Skills, Recall the Traumatic event without being overwhelmed      Intervention: CBT/Tx plan update  Summary: Eugenio Hoesric Archila is a 57 y.o. male. Pt presents for his individual counseling session. Pt discussed his psychiatric symptoms and current life events. Pt presents depressed today. He saw his psychiatrist at [facility] who increased his Lamictal and Buspar. She also wants him to go to a group at the [facility]. He really didn't know what kind of group but the psychiatrist knows he is in therapy here. I encouraged him to go to any group offered at the [facility]. Pt was in agreement. Pt has been encouraged and is following through with any community [name]'s group he can participate in, gym membership, anything where he can get out of the house and be around people. Pt reports he is moving forward with separation and divorce. He is sad and tries to blame himself. Processed with pt negatives of self blaming. Pt has a financial plan to move forward with his own life where he seeks peace of mind.        Suicidal/Homicidal: Nowithout intent/plan  Therapist Response: Assessed pt's current functioning and and reviewed progress. Assisted pt processing  family relationships, separation from wife, veteran groups, self care.  Assisted pt processing healthy coping skills for the management of his stressors.  Plan: Return again in 2 weeks for individual therapy and OP group weekly  Diagnosis: Axis I:MDD, recurrent episode moderate     Jahrell Hamor S, LCAS   09/03/17

## 2017-09-16 ENCOUNTER — Ambulatory Visit (HOSPITAL_COMMUNITY): Payer: Self-pay | Admitting: Licensed Clinical Social Worker

## 2017-09-18 ENCOUNTER — Ambulatory Visit (INDEPENDENT_AMBULATORY_CARE_PROVIDER_SITE_OTHER): Payer: Medicare HMO | Admitting: Licensed Clinical Social Worker

## 2017-09-18 DIAGNOSIS — F331 Major depressive disorder, recurrent, moderate: Secondary | ICD-10-CM

## 2017-09-19 ENCOUNTER — Encounter (HOSPITAL_COMMUNITY): Payer: Self-pay | Admitting: Licensed Clinical Social Worker

## 2017-09-19 NOTE — Progress Notes (Signed)
   THERAPIST PROGRESS NOTE  Session Time: 1:10-2pm  Participation Level: Active  Behavioral Response: Casual/Alert/Depressed  Type of Therapy: Individual Therapy  Treatment Goals addressed:Improve psychiatric symptoms, Controlled Behavior, Moderated Mood, Improve Unhelpful Thought Patterns, Emotional Regulation Skills (Moderate moods, anger management, stress management), Feel and express a full Range of Emotions, Learn about Diagnosis, Healthy Coping Skills.  Intervention: CBT/Tx plan update  Summary: Mark [name] is a 57 y.o. male. Pt presents for his individual counseling session. Pt discussed his psychiatric symptoms and current life events. Pt presents depressed today. He still plans to move forward with his divorce. He has talked to his children in college about his plans. Asked open ended questions and used empathic reflection. Pt reports he feels like a failure in marriage. Processed this with pt. Taught pt how to turn negative thoughts into positive thoughts. Role played with pt. Pt is working on his self care, going to the gym, walking his dog, seeing family. Encouraged pt to continue with self care and working on negative thoughts.     .        Suicidal/Homicidal: Nowithout intent/plan  Therapist Response: Assessed pt's current functioning and and reviewed progress. Assisted pt processing  family relationships, separation from wife, negative thoughts, self care.  Assisted pt processing healthy coping skills for the management of his stressors.  Plan: Return again in 2 weeks for individual therapy and OP group weekly  Diagnosis: Axis I:MDD, recurrent episode moderate     Shayleigh Bouldin S, LCAS  09/18/17

## 2017-09-30 ENCOUNTER — Ambulatory Visit (INDEPENDENT_AMBULATORY_CARE_PROVIDER_SITE_OTHER): Payer: Non-veteran care | Admitting: Licensed Clinical Social Worker

## 2017-09-30 ENCOUNTER — Ambulatory Visit (HOSPITAL_COMMUNITY): Payer: Self-pay | Admitting: Licensed Clinical Social Worker

## 2017-09-30 ENCOUNTER — Ambulatory Visit (HOSPITAL_COMMUNITY): Payer: Medicare HMO | Admitting: Licensed Clinical Social Worker

## 2017-09-30 DIAGNOSIS — F4310 Post-traumatic stress disorder, unspecified: Secondary | ICD-10-CM | POA: Diagnosis not present

## 2017-10-02 NOTE — Progress Notes (Signed)
   THERAPIST PROGRESS NOTE  Session Time: 5:30pm-6:30pm  Participation Level: Active  Behavioral Response: CasualAlertEuphoric  Type of Therapy: Group Therapy  Treatment Goals addressed: Anger  Interventions: CBT, Motivational Interviewing and Supportive  Summary: Mark [name] is a 57 y.o. male who presents with PTSD. Client verbalized several recent altercations which caused increase in stress and agitation. Client reports his impulsive behaviors were of less intensity however he was very frustrated when feeing disrespected.    Suicidal/Homicidal: Nowithout intent/plan  Therapist Response: Clinician facilitated discussion on communication styles. Clinician and group members processed recent events which could have added to aggressive communication, or avoidance. Clinician provided active listening and reflective statements, praising clients for use of previously learned coping skills. Group members processed communicating differently with different groups of people such as work, Solicitor, [employer], based on level of comfort with the person and how vulnerable they are able or willing to feel at that moment. Clinician and group members processed being able to sit with an unhelpful, or overwhelming emotion without having to act.  Plan: Return again in 1-2 weeks.  Diagnosis: Axis I: Post Traumatic Stress Disorder      Harlon Ditty, LCSW 10/02/2017

## 2017-10-07 ENCOUNTER — Ambulatory Visit (INDEPENDENT_AMBULATORY_CARE_PROVIDER_SITE_OTHER): Payer: Non-veteran care | Admitting: Licensed Clinical Social Worker

## 2017-10-07 DIAGNOSIS — F331 Major depressive disorder, recurrent, moderate: Secondary | ICD-10-CM | POA: Diagnosis not present

## 2017-10-08 ENCOUNTER — Encounter (HOSPITAL_COMMUNITY): Payer: Self-pay | Admitting: Licensed Clinical Social Worker

## 2017-10-08 NOTE — Progress Notes (Signed)
Daily Group progress Note           Program: OP Evening [name]'s Group    Group Time: 5:30-6:30pm  Participation Level: Active  Behavioral Response: Appropriate  Type of Therapy:  Psychoeducation/Therapy  Summary of Progress: Pt participated in a discussion on change during mental health recovery. Change in relationships, change in care taking, change in family and friends relationships. Pt was encouraged by other group members to work on change being a positive, use skills to deal with new feelings while working on future goals.   Ottis StainLisbeth Mackenzie, LCAS

## 2017-10-14 ENCOUNTER — Ambulatory Visit (INDEPENDENT_AMBULATORY_CARE_PROVIDER_SITE_OTHER): Payer: Medicare HMO | Admitting: Licensed Clinical Social Worker

## 2017-10-14 DIAGNOSIS — F331 Major depressive disorder, recurrent, moderate: Secondary | ICD-10-CM

## 2017-10-15 ENCOUNTER — Encounter (HOSPITAL_COMMUNITY): Payer: Self-pay | Admitting: Licensed Clinical Social Worker

## 2017-10-15 NOTE — Progress Notes (Signed)
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 Pt discussed emotions, vulnerability, family relationships, participating in [employer] organizations. 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   Ottis StainLisbeth Mackenzie, LCAS

## 2017-10-21 ENCOUNTER — Ambulatory Visit (INDEPENDENT_AMBULATORY_CARE_PROVIDER_SITE_OTHER): Payer: Medicare HMO | Admitting: Licensed Clinical Social Worker

## 2017-10-21 DIAGNOSIS — F331 Major depressive disorder, recurrent, moderate: Secondary | ICD-10-CM

## 2017-10-22 ENCOUNTER — Encounter (HOSPITAL_COMMUNITY): Payer: Self-pay | Admitting: Licensed Clinical Social Worker

## 2017-10-22 NOTE — Progress Notes (Signed)
  Daily Group progress Note     Program: OP Evening [name]'s Group    Group Time: 5:30-6:30pm  Participation Level: Active  Behavioral Response: Appropriate  Type of Therapy:  Psychoeducation/Therapy  Summary of Progress:  Pt participated in a discussion on "words that define us," our internal self-talk. Words that are said to us that may or may not define us, we believe they are true. Pt was encouraged to use positive affirmations in routine of self care.  Lisbeth Mackenzie, LCAS 

## 2017-10-28 ENCOUNTER — Ambulatory Visit (INDEPENDENT_AMBULATORY_CARE_PROVIDER_SITE_OTHER): Payer: No Typology Code available for payment source | Admitting: Licensed Clinical Social Worker

## 2017-10-28 DIAGNOSIS — F331 Major depressive disorder, recurrent, moderate: Secondary | ICD-10-CM | POA: Diagnosis not present

## 2017-10-29 ENCOUNTER — Encounter (HOSPITAL_COMMUNITY): Payer: Self-pay | Admitting: Licensed Clinical Social Worker

## 2017-10-29 NOTE — Progress Notes (Signed)
Daily Group progress Note              Program: OP Evening [name]'s Group    Group Time: 5:30-6:30pm  Participation Level: Active  Behavioral Response: Appropriate  Type of Therapy:  Psychoeducation/Therapy  Summary of Progress:  Pt participated in a discussion: "I feel a lingering feeling of sadness." The group agreed they all feel it. The group defined sadness as a human emotion that all people feel at certain times during their lives. Feeling sad is a natural reaction to situations that cause emotional upset or pain. There are varying degrees of sadness, but like other emotions, sadness is temporary and fades with time. In this way, sadness differs from depression. Discussion ensued on depression vs sadness.  Ottis Stain, LCAS

## 2017-11-04 ENCOUNTER — Ambulatory Visit (INDEPENDENT_AMBULATORY_CARE_PROVIDER_SITE_OTHER): Payer: No Typology Code available for payment source | Admitting: Licensed Clinical Social Worker

## 2017-11-04 DIAGNOSIS — F331 Major depressive disorder, recurrent, moderate: Secondary | ICD-10-CM | POA: Diagnosis not present

## 2017-11-05 ENCOUNTER — Encounter (HOSPITAL_COMMUNITY): Payer: Self-pay | Admitting: Licensed Clinical Social Worker

## 2017-11-05 ENCOUNTER — Ambulatory Visit (INDEPENDENT_AMBULATORY_CARE_PROVIDER_SITE_OTHER): Payer: No Typology Code available for payment source | Admitting: Licensed Clinical Social Worker

## 2017-11-05 DIAGNOSIS — F331 Major depressive disorder, recurrent, moderate: Secondary | ICD-10-CM

## 2017-11-05 NOTE — Progress Notes (Signed)
   THERAPIST PROGRESS NOTE  Session Time: 1:10-2pm  Participation Level: Active  Behavioral Response: Casual/Alert/Euthymic  Type of Therapy: Individual Therapy  Treatment Goals addressed:Improve psychiatric symptoms, Controlled Behavior, Moderated Mood, Improve Unhelpful Thought Patterns, Emotional Regulation Skills (Moderate moods, anger management, stress management), Feel and express a full Range of Emotions, Learn about Diagnosis, Healthy Coping Skills.  Intervention: CBT/Tx plan update  Summary: Mark [name] is a 57 y.o. male. Pt presents for his individual counseling session. Pt discussed his psychiatric symptoms and current life events. Pt presents wnl today. He continues with his plans to move forward with his divorce. Discussed the possibility that his wife is a trigger. He agrees and limits his communication with her. Pt's mood has improved in the last few sessions, including group. Pt has finally started following through with suggestions to get him out of the house. He's volunteering at [facility], amputee coalition, Biblical counseling at church. Discussed these opportunities but also processed overbooking himself into burnout. Asked open ended questions. Pt continues to be triggered by TV shows. Discussed alternatives to these specific shows and discussed the trigger itself.    Suicidal/Homicidal: Nowithout intent/plan  Therapist Response: Assessed pt's current functioning and and reviewed progress. Assisted pt processing  Triggers, moods, volunteer work, overbooking self, burnout, separation from wife,   Assisted pt processing healthy coping skills for the management of his stressors.  Plan: Return again in 2 weeks for individual therapy and OP group weekly  Diagnosis: Axis I:MDD, recurrent episode moderate     MACKENZIE,LISBETH S, LCAS  09/18/17

## 2017-11-05 NOTE — Progress Notes (Signed)
Daily Group progress Note    Program: OP Evening [name]'s Group    Group Time: 5:30-6:30pm  Participation Level: Active  Behavioral Response: Appropriate  Type of Therapy:  Psychoeducation/Therapy  Summary of Progress:  Pt participated in a discussion on playing video games as a coping tool for stress and anxiety. Pt discussed how playing video games diverts his mind from racing thoughts, external and internal triggers of anxiety, and socialization. Pt participated in a discussion on the different types of video games that cause difficulty to a combat disabled veteran: any war type games. Pt was encouraged to play video games that assist as a positive coping tool.  Lisbeth Mackenzie, LCAS 

## 2017-11-11 ENCOUNTER — Ambulatory Visit (INDEPENDENT_AMBULATORY_CARE_PROVIDER_SITE_OTHER): Payer: No Typology Code available for payment source | Admitting: Licensed Clinical Social Worker

## 2017-11-11 DIAGNOSIS — F331 Major depressive disorder, recurrent, moderate: Secondary | ICD-10-CM | POA: Diagnosis not present

## 2017-11-12 ENCOUNTER — Encounter (HOSPITAL_COMMUNITY): Payer: Self-pay | Admitting: Licensed Clinical Social Worker

## 2017-11-12 NOTE — Progress Notes (Signed)
   THERAPIST PROGRESS NOTE  Session Time: 5:30-6:30pm  Participation Level: Active  Behavioral Response: Casual/Alert/depressed mood  Type of Therapy: Individual Therapy  Treatment Goals addressed:Improve psychiatric symptoms, Controlled Behavior, Moderated Mood, Improve Unhelpful Thought Patterns, Emotional Regulation Skills (Moderate moods, anger management, stress management), Feel and express a full Range of Emotions, Learn about Diagnosis, Healthy Coping Skills.  Intervention: CBT/Tx plan update  Summary: Mark [name] is a 57 y.o. male. Pt presents for his individual counseling session. Pt discussed his psychiatric symptoms and current life events. Pt presents depressed today. Asked open ended questions. Pt reports of the demise of his marriage and the feelings of failure surrounding his failure. Discussed his negative thoughts and how they affect his mood. Educated pt on how to change his negative thoughts. Pt has begun the SMART THINK program at [place] in [place] which assists with brain health, attention, memory (10 wk/1x per week). After that program he will begin the Functional Adaptive, cognitive re-training program (FACT) - 6 weeks (2x/wk). Asked open ended questions.  Discussed with pt what his "new normal" will look like. Pt disagreed with the term and said the docs tried to use it when they amputated his leg. Discussed with pt alternative ways to describe his next phase of life. Pt became more open to discuss his next phase of life.   Suicidal/Homicidal: Nowithout intent/plan  Therapist Response: Assessed pt's current functioning and and reviewed progress. Assisted pt processing  [facility] programs, new normal or next phase of life.   Assisted pt processing healthy coping skills for the management of his stressors.  Plan: Return again in 2 weeks for individual therapy and OP group weekly  Diagnosis: Axis I:MDD, recurrent episode moderate     MACKENZIE,LISBETH S, LCAS  11/12/17

## 2017-11-18 ENCOUNTER — Ambulatory Visit (INDEPENDENT_AMBULATORY_CARE_PROVIDER_SITE_OTHER): Payer: No Typology Code available for payment source | Admitting: Licensed Clinical Social Worker

## 2017-11-18 DIAGNOSIS — F331 Major depressive disorder, recurrent, moderate: Secondary | ICD-10-CM | POA: Diagnosis not present

## 2017-11-19 ENCOUNTER — Encounter (HOSPITAL_COMMUNITY): Payer: Self-pay | Admitting: Licensed Clinical Social Worker

## 2017-11-19 NOTE — Progress Notes (Signed)
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 Many veterans who experience PTSD symptoms prevent the feeling of pleasure, closeness, and trust. Pt shared his lack of trust from everyone except family. Pt was encouraged to continue working on building trust in individual and group therapy sessions.  Genavieve Mangiapane, LCAS  

## 2017-11-25 ENCOUNTER — Ambulatory Visit (INDEPENDENT_AMBULATORY_CARE_PROVIDER_SITE_OTHER): Payer: No Typology Code available for payment source | Admitting: Licensed Clinical Social Worker

## 2017-11-25 DIAGNOSIS — F331 Major depressive disorder, recurrent, moderate: Secondary | ICD-10-CM

## 2017-11-26 ENCOUNTER — Encounter (HOSPITAL_COMMUNITY): Payer: Self-pay | Admitting: Licensed Clinical Social Worker

## 2017-11-26 NOTE — Progress Notes (Signed)
Daily Group progress Note         Program: OP Veterans Evening Group    Group Time: 5:30-6:30pm  Participation Level: Active  Behavioral Response: Appropriate  Type of Therapy:  Psychoeducation/Therapy  Summary of Progress:  Pt participated in a discussion on "The Wise Mind." A discussion on emotional, reasonable and wise minds ensued. A handout was given to patient and experiences of the emotional, reasonable and wise mind was shared. Pt was encouraged to move towards the wise mind for balance.  Lisbeth Mackenzie, LCAS  

## 2017-11-27 ENCOUNTER — Ambulatory Visit (INDEPENDENT_AMBULATORY_CARE_PROVIDER_SITE_OTHER): Payer: No Typology Code available for payment source | Admitting: Licensed Clinical Social Worker

## 2017-11-27 ENCOUNTER — Encounter (HOSPITAL_COMMUNITY): Payer: Self-pay | Admitting: Licensed Clinical Social Worker

## 2017-11-27 DIAGNOSIS — F331 Major depressive disorder, recurrent, moderate: Secondary | ICD-10-CM | POA: Diagnosis not present

## 2017-11-27 NOTE — Progress Notes (Signed)
   THERAPIST PROGRESS NOTE  Session Time: 3:30-4:20pm  Participation Level: Active  Behavioral Response: Casual/Alert/Euthymic  Type of Therapy: Individual Therapy  Treatment Goals addressed:Improve psychiatric symptoms, Controlled Behavior, Moderated Mood, Improve Unhelpful Thought Patterns, Emotional Regulation Skills (Moderate moods, anger management, stress management), Feel and express a full Range of Emotions, Learn about Diagnosis, Healthy Coping Skills.  Intervention: CBT  Summary: Mark [name] is a 57 y.o. male. Pt presents for his individual counseling session. Pt discussed his psychiatric symptoms and current life events. Pt presents more depressed today. His depressed moods have increased. He does not find any on-going happiness. 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 He has struggled with his prosthetic; it has broken 2x and pt has fallen. Pt has been encouraged to follow through with suggestions previously made so that he is around people. He enjoys the tasks but when he leaves he goes back into his depression. He lives alone and has a part time aid 3x per week to assist around the house. His family lives in [place] and do not intend to move to [facility]. Pt encounters many obstacles at home and in the community (both physical and emotional). Pt is currently in the process of a separation from his wife of 24 years. He feels like a failure. Pt has been able to recognize some of his triggers which have been addressed in session with continued suggestions of coping tools. Pt Took the PHQ-9 and scored a 21.         Suicidal/Homicidal: Nowithout intent/plan  Therapist Response: Assessed pt's current functioning and and reviewed progress. Assisted pt processing depressive symptoms, coping strategies, Triggers, moods, feeling of failure, amputation struggles, separation from wife,   Assisted pt processing healthy coping skills for the management of  his stressors.  Plan: Return again in 2 weeks for individual therapy and OP group weekly  Diagnosis: Axis I:MDD, recurrent episode moderate     Keala Drum S, LCAS  11/27/17

## 2017-12-02 ENCOUNTER — Ambulatory Visit (HOSPITAL_COMMUNITY): Payer: Medicare HMO | Admitting: Licensed Clinical Social Worker

## 2017-12-03 ENCOUNTER — Ambulatory Visit (HOSPITAL_COMMUNITY): Payer: Medicare HMO | Admitting: Licensed Clinical Social Worker

## 2017-12-09 ENCOUNTER — Ambulatory Visit (HOSPITAL_COMMUNITY): Payer: Medicare HMO | Admitting: Licensed Clinical Social Worker

## 2017-12-11 ENCOUNTER — Ambulatory Visit (HOSPITAL_COMMUNITY): Payer: Medicare HMO | Admitting: Licensed Clinical Social Worker

## 2017-12-11 ENCOUNTER — Telehealth (HOSPITAL_COMMUNITY): Payer: Self-pay | Admitting: Licensed Clinical Social Worker

## 2017-12-11 NOTE — Telephone Encounter (Signed)
Called pt after NS today. Confirmed pt did not go to [employer] meeting last night. Called pt Mon evening as well. Called [employer] to do a safety check.  lsibeth Shereen Marton, LCAS

## 2017-12-16 ENCOUNTER — Ambulatory Visit (INDEPENDENT_AMBULATORY_CARE_PROVIDER_SITE_OTHER): Payer: No Typology Code available for payment source | Admitting: Licensed Clinical Social Worker

## 2017-12-16 DIAGNOSIS — F331 Major depressive disorder, recurrent, moderate: Secondary | ICD-10-CM

## 2017-12-17 ENCOUNTER — Encounter (HOSPITAL_COMMUNITY): Payer: Self-pay | Admitting: Licensed Clinical Social Worker

## 2017-12-17 NOTE — Progress Notes (Signed)
Daily Group progress Note           Program: OP [facility] Evening Group    Group Time: 5:30-6:30pm  Participation Level: Active  Behavioral Response: Appropriate  Type of Therapy:  Psychoeducation/Therapy  Summary of Progress:  Pt participated in a discussion on living life with optimism. If you focus on optimism, you will change your life and live a better, more positive life. Pt was encouraged to live life with more optimism.   Finneus Kaneshiro, LCAS  

## 2017-12-23 ENCOUNTER — Ambulatory Visit (HOSPITAL_COMMUNITY): Payer: Medicare HMO | Admitting: Licensed Clinical Social Worker

## 2017-12-24 ENCOUNTER — Ambulatory Visit (HOSPITAL_COMMUNITY): Payer: Medicare HMO | Admitting: Licensed Clinical Social Worker

## 2017-12-26 ENCOUNTER — Other Ambulatory Visit: Payer: Self-pay

## 2017-12-26 ENCOUNTER — Emergency Department (HOSPITAL_COMMUNITY)
Admission: EM | Admit: 2017-12-26 | Discharge: 2017-12-27 | Disposition: A | Discharge status: Home / Self Care | Payer: Medicare HMO | Attending: Emergency Medicine | Admitting: Emergency Medicine

## 2017-12-26 ENCOUNTER — Encounter (HOSPITAL_COMMUNITY): Payer: Self-pay

## 2017-12-26 DIAGNOSIS — Z794 Long term (current) use of insulin: Secondary | ICD-10-CM | POA: Diagnosis not present

## 2017-12-26 DIAGNOSIS — Z79899 Other long term (current) drug therapy: Secondary | ICD-10-CM | POA: Diagnosis not present

## 2017-12-26 DIAGNOSIS — R1013 Epigastric pain: Secondary | ICD-10-CM | POA: Diagnosis not present

## 2017-12-26 DIAGNOSIS — F17210 Nicotine dependence, cigarettes, uncomplicated: Secondary | ICD-10-CM | POA: Diagnosis not present

## 2017-12-26 DIAGNOSIS — R748 Abnormal levels of other serum enzymes: Secondary | ICD-10-CM

## 2017-12-26 DIAGNOSIS — E119 Type 2 diabetes mellitus without complications: Secondary | ICD-10-CM | POA: Diagnosis not present

## 2017-12-26 LAB — COMPREHENSIVE METABOLIC PANEL
ALT: 19 U/L (ref 0–44)
AST: 25 U/L (ref 15–41)
Albumin: 3.6 g/dL (ref 3.5–5.0)
Alkaline Phosphatase: 72 U/L (ref 38–126)
Anion gap: 11 (ref 5–15)
BUN: 9 mg/dL (ref 6–20)
CO2: 23 mmol/L (ref 22–32)
Calcium: 8.9 mg/dL (ref 8.9–10.3)
Chloride: 103 mmol/L (ref 98–111)
Creatinine, Ser: 1.02 mg/dL (ref 0.61–1.24)
GFR calc Af Amer: >60 mL/min (ref >60)
GFR calc non Af Amer: >60 mL/min (ref >60)
Glucose, Bld: 134 mg/dL — ABNORMAL HIGH (ref 70–99)
Potassium: 4.2 mmol/L (ref 3.5–5.1)
Sodium: 137 mmol/L (ref 135–145)
Total Bilirubin: 0.7 mg/dL (ref 0.3–1.2)
Total Protein: 6.4 g/dL — ABNORMAL LOW (ref 6.5–8.1)

## 2017-12-26 LAB — CBC
HCT: 39.8 % (ref 39.0–52.0)
Hemoglobin: 12.3 g/dL — ABNORMAL LOW (ref 13.0–17.0)
MCH: 26.5 pg (ref 26.0–34.0)
MCHC: 30.9 g/dL (ref 30.0–36.0)
MCV: 85.6 fL (ref 80.0–100.0)
Platelets: 206 10*3/uL (ref 150–400)
RBC: 4.65 MIL/uL (ref 4.22–5.81)
RDW: 14 % (ref 11.5–15.5)
WBC: 8.3 10*3/uL (ref 4.0–10.5)
nRBC: 0 % (ref 0.0–0.2)

## 2017-12-26 LAB — LIPASE, BLOOD: Lipase: 159 U/L — ABNORMAL HIGH (ref 11–51)

## 2017-12-26 NOTE — ED Triage Notes (Signed)
Pt here with upper abdominal pain after eating.  Told to come to ED by PCP for elevated enzymes, pt does not know which. DM Type 2, HTN.  A&Ox4

## 2017-12-27 LAB — URINALYSIS, ROUTINE W REFLEX MICROSCOPIC
Bilirubin Urine: NEGATIVE
Glucose, UA: NEGATIVE mg/dL
Hgb urine dipstick: NEGATIVE
KETONES UR: NEGATIVE mg/dL
Leukocytes, UA: NEGATIVE
Nitrite: NEGATIVE
PROTEIN: NEGATIVE mg/dL
Specific Gravity, Urine: 1.005 (ref 1.005–1.030)
pH: 6 (ref 5.0–8.0)

## 2017-12-27 NOTE — ED Provider Notes (Signed)
[facility] Provider Note  CSN: [id number] Arrival date & time: 12/26/17 2136  Chief Complaint(s) Abdominal Pain  HPI Mark [name] is a 57 y.o. male   The history is provided by the patient.  Abdominal Pain   This is a new problem. Episode onset: 3 weeks. The problem occurs constantly. The pain is located in the epigastric region. The quality of the pain is aching, dull and burning. Pain severity now: mild to moderate. Pertinent negatives include fever, diarrhea, hematochezia, melena, nausea, vomiting, constipation, dysuria, arthralgias and myalgias. The symptoms are aggravated by eating. Nothing relieves the symptoms.   Has been on Ozempic for 3 months. Was also placed on prednisone 2 weeks ago for a rash.   Past Medical History Past Medical History:  Diagnosis Date  . Above knee amputation status, right   . Anxiety   . Depression   . Diabetes mellitus, type II (HCC)   . Shoulder pain    Patient Active Problem List   Diagnosis Date Noted  . PTSD (post-traumatic stress disorder) 04/15/2017  . Major depressive disorder, recurrent episode, moderate (HCC) 04/30/2016  . Generalized anxiety disorder 04/30/2016   Home Medication(s) Prior to Admission medications   Medication Sig Start Date End Date Taking? Authorizing Provider  atorvastatin (LIPITOR) 80 MG tablet Take 80 mg by mouth daily.    [provider]  buPROPion (WELLBUTRIN XL) 300 MG 24 hr tablet Take 1 tablet (300 mg total) by mouth daily. 06/20/17   Arfeen, Phillips GroutSyed T, MD  docusate sodium (COLACE) 50 MG capsule Take 50 mg by mouth 2 (two) times daily.    [provider]  insulin aspart protamine- aspart (NOVOLOG MIX 70/30) (70-30) 100 UNIT/ML injection Inject into the skin.    [provider]  Insulin Glargine (LANTUS [place]) Inject into the skin.    [provider]  lamoTRIgine (LAMICTAL) 100 MG tablet Take 1 tablet (100 mg total) by mouth daily. 06/20/17    Arfeen, Phillips GroutSyed T, MD  lisinopril (PRINIVIL,ZESTRIL) 5 MG tablet Take 5 mg by mouth daily.    [provider]  metFORMIN (GLUCOPHAGE) 1000 MG tablet Take 1,000 mg by mouth 2 (two) times daily with a meal.    [provider]  oxyCODONE-acetaminophen (PERCOCET) 5-325 MG tablet Take by mouth every 4 (four) hours as needed for severe pain.    [provider]  prazosin (MINIPRESS) 2 MG capsule Take 1 capsule (2 mg total) by mouth at bedtime. 06/20/17   Arfeen, Phillips GroutSyed T, MD  sildenafil (VIAGRA) 100 MG tablet Take 100 mg by mouth daily as needed for erectile dysfunction.    [provider]  traZODone (DESYREL) 50 MG tablet Take 50 mg by mouth at bedtime.    [provider]  Past Surgical History Past Surgical History:  Procedure Laterality Date  . HERNIA REPAIR    . LEG AMPUTATION Right    Family History Family History  Problem Relation Age of Onset  . Aneurysm Mother   . Leukemia Father     Social History Social History   Tobacco Use  . Smoking status: Current Every Day Smoker    Packs/day: 1.50    Years: 35.00    Pack years: 52.50    Types: Cigarettes  . Smokeless tobacco: Never Used  . Tobacco comment: Has cut back to 1/2 pack and trying to quit  Substance Use Topics  . Alcohol use: Yes    Alcohol/week: 5.0 standard drinks    Types: 2 Glasses of wine, 2 Shots of liquor, 1 Cans of beer per week  . Drug use: No   Allergies Lyrica [pregabalin]  Review of Systems Review of Systems  Constitutional: Negative for fever.  Gastrointestinal: Positive for abdominal pain. Negative for constipation, diarrhea, hematochezia, melena, nausea and vomiting.  Genitourinary: Negative for dysuria.  Musculoskeletal: Negative for arthralgias and myalgias.   All other systems are reviewed and are negative for acute change except as  noted in the HPI  Physical Exam Vital Signs  I have reviewed the triage vital signs BP (!) 153/85 (BP Location: Right Arm)   Pulse 90   Temp 98.8 F (37.1 C) (Oral)   Resp 16   SpO2 99%   Physical Exam  Constitutional: He is oriented to person, place, and time. He appears well-developed and well-nourished. No distress.  HENT:  Head: Normocephalic and atraumatic.  Right Ear: External ear normal.  Left Ear: External ear normal.  Nose: Nose normal.  Mouth/Throat: Mucous membranes are normal. No trismus in the jaw.  Eyes: Conjunctivae and EOM are normal. No scleral icterus.  Neck: Normal range of motion and phonation normal.  Cardiovascular: Normal rate and regular rhythm.  Pulmonary/Chest: Effort normal. No stridor. No respiratory distress.  Abdominal: He exhibits no distension. There is tenderness (mild discomfort) in the epigastric area. There is no rigidity, no rebound, no guarding and no CVA tenderness.  Musculoskeletal: Normal range of motion. He exhibits no edema.       Legs: Neurological: He is alert and oriented to person, place, and time.  Skin: He is not diaphoretic.  Psychiatric: He has a normal mood and affect. His behavior is normal.  Vitals reviewed.   ED Results and Treatments Labs (all labs ordered are listed, but only abnormal results are displayed) Labs Reviewed  LIPASE, BLOOD - Abnormal; Notable for the following components:      Result Value   Lipase 159 (*)    All other components within normal limits  COMPREHENSIVE METABOLIC PANEL - Abnormal; Notable for the following components:   Glucose, Bld 134 (*)    Total Protein 6.4 (*)    All other components within normal limits  CBC - Abnormal; Notable for the following components:   Hemoglobin 12.3 (*)    All other components within normal limits  URINALYSIS, ROUTINE W REFLEX MICROSCOPIC - Abnormal; Notable for the following components:   Color, Urine STRAW (*)    All other components within normal  limits  EKG  EKG Interpretation  Date/Time:    Ventricular Rate:    PR Interval:    QRS Duration:   QT Interval:    QTC Calculation:   R Axis:     Text Interpretation:        Radiology No results found. Pertinent labs & imaging results that were available during my care of the patient were reviewed by me and considered in my medical decision making (see chart for details).  Medications Ordered in ED Medications - No data to display                                                                                                                                  Procedures Procedures  (including critical care time)  Medical Decision Making / ED Course I have reviewed the nursing notes for this encounter and the patient's prior records (if available in EHR or on provided paperwork).    Patient with epigastric abdominal pain worse with eating.  He has very mild epigastric discomfort to palpation without evidence of peritonitis.  Labs were grossly reassuring without leukocytosis or anemia.  No significant electrolyte derangements or renal insufficiency.  No evidence of biliary obstruction.  Lipase was elevated 3 times above the upper limits of normal.  This may be related to Ozempic use.  His symptom etiology may be early pancreatitis versus gastritis.  Doubt other serious intra-abdominal inflammatory/infectious process requiring imaging at this time.  Patient was instructed to start a clear liquid/bland diet.  He was instructed to [facility] to continue monitoring enzymes.  The patient appears reasonably screened and/or stabilized for discharge and I doubt any other medical condition or other [facility] requiring further screening, evaluation, or treatment in the ED at this time prior to discharge.  The patient is safe for discharge with  strict return precautions.    Final Clinical Impression(s) / ED Diagnoses Final diagnoses:  Elevated lipase  Epigastric pain   Disposition: Discharge  Condition: Good  I have discussed the results, Dx and Tx plan with the patient who expressed understanding and agree(s) with the plan. Discharge instructions discussed at great length. The patient was given strict return precautions who verbalized understanding of the instructions. No further questions at time of discharge.    ED Discharge Orders    None       Follow Up: [facility]  Schedule an appointment as soon as possible for a visit  To continue monitoring pancreatic enzymes      This chart was dictated using voice recognition software.  Despite best efforts to proofread,  errors can occur which can change the documentation meaning.   Nira Conn, MD 12/27/17 [phone]

## 2017-12-27 NOTE — Discharge Instructions (Addendum)
For the next week, convert to a clear liquid/bland diet.  Monitor your sugars closely.  Please call your endocrinologist in the morning for close follow-up.

## 2017-12-30 ENCOUNTER — Ambulatory Visit (INDEPENDENT_AMBULATORY_CARE_PROVIDER_SITE_OTHER): Payer: No Typology Code available for payment source | Admitting: Licensed Clinical Social Worker

## 2017-12-30 DIAGNOSIS — F4310 Post-traumatic stress disorder, unspecified: Secondary | ICD-10-CM

## 2017-12-31 ENCOUNTER — Ambulatory Visit (HOSPITAL_COMMUNITY): Payer: Medicare HMO | Admitting: Licensed Clinical Social Worker

## 2017-12-31 ENCOUNTER — Encounter (HOSPITAL_COMMUNITY): Payer: Self-pay | Admitting: Licensed Clinical Social Worker

## 2017-12-31 NOTE — Progress Notes (Signed)
Daily Group progress Note            Program: OP [facility] Evening Group    Group Time: 5:00-6:30pm  Participation Level: Active  Behavioral Response: Appropriate  Type of Therapy:  Psychoeducation/Therapy  Summary of Progress:  Patient participated in a discussion on boundaries without expectations, analyzing other's actions and weighing our reactions to the actions of others, real or assumed.  Patient was encouraged to use boundaries, which may be coupled with emotions and feelings without judgement.  Felecity Lemaster, LCAS 

## 2018-01-01 ENCOUNTER — Ambulatory Visit (INDEPENDENT_AMBULATORY_CARE_PROVIDER_SITE_OTHER): Payer: Medicare HMO | Admitting: Licensed Clinical Social Worker

## 2018-01-01 ENCOUNTER — Encounter (HOSPITAL_COMMUNITY): Payer: Self-pay | Admitting: Licensed Clinical Social Worker

## 2018-01-01 DIAGNOSIS — F331 Major depressive disorder, recurrent, moderate: Secondary | ICD-10-CM

## 2018-01-01 NOTE — Progress Notes (Signed)
   THERAPIST PROGRESS NOTE  Session Time: 2:30-3:20pm  Participation Level: Active  Behavioral Response: Casual/Alert/Euthymic  Type of Therapy: Individual Therapy  Treatment Goals addressed:Improve psychiatric symptoms, Controlled Behavior, Moderated Mood, Improve Unhelpful Thought Patterns, Emotional Regulation Skills (Moderate moods, anger management, stress management), Feel and express a full Range of Emotions, Learn about Diagnosis, Healthy Coping Skills.  Intervention: CBT  Summary: Mark [name] is a 57 y.o. male. Pt presents for his individual counseling session. Pt discussed his psychiatric symptoms and current life events. Pt presents more depressed today. His depressed moods have decreased but when he has episodes they are more intense. He continues to have temporary happiness but not on-going happiness. 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 Pt has been encouraged to reach out to members of the veteran's group for help. Pt is still active in veteran's groups, church and is starting volunteer with [facility]. Pt continues to have family relationship issues. He talked today about "fatherhood." and his feelings around all the things he has not accomplished as a father due to his disabilities. Processed this at length with pt affirming his capabilities and successes as a father. [facility]. He cannot get another appt until March. [facility].      Suicidal/Homicidal: Nowithout intent/plan  Therapist Response: Assessed pt's current functioning and and reviewed progress. Assisted pt processing depressive symptoms, coping strategies, , moods, feeling of failure, disabilities.   Assisted pt processing healthy coping skills for the management of his stressors.  Plan: Return  again in 1 weeks for individual therapy and OP group weekly  Diagnosis: Axis I:MDD, recurrent episode moderate     MACKENZIE,LISBETH S, LCAS  01/01/18

## 2018-01-06 ENCOUNTER — Ambulatory Visit (INDEPENDENT_AMBULATORY_CARE_PROVIDER_SITE_OTHER): Payer: Medicare HMO | Admitting: Licensed Clinical Social Worker

## 2018-01-06 DIAGNOSIS — F331 Major depressive disorder, recurrent, moderate: Secondary | ICD-10-CM

## 2018-01-07 ENCOUNTER — Ambulatory Visit (HOSPITAL_COMMUNITY): Payer: Medicare HMO | Admitting: Licensed Clinical Social Worker

## 2018-01-07 ENCOUNTER — Encounter (HOSPITAL_COMMUNITY): Payer: Self-pay | Admitting: Licensed Clinical Social Worker

## 2018-01-07 NOTE — Progress Notes (Signed)
  Daily Group progress Note                   Program: OP [facility] Evening Group    Group Time: 5:00-7:00pm  Participation Level: Active  Behavioral Response: Appropriate  Type of Therapy:  Psychoeducation/Therapy  Summary of Progress:  Pt participated in a discussion on impulsive anger, a secondary emotion. Educated patient on how actions of others, may cause impulsive lashing out and expressing the secondary emotion, impulsive anger, instead of an underlying emotion (i.e. hurt, feeling disrespected, embarrassed, etc.). Patient may feel too vulnerable or insecure in the moment, and therefore easier to express anger. Pt identified his feelings when he expresses anger instead of the identified emotion. Pt was encouraged to become more vulnerable in expressing his emotions.  Helayna Dun LCAS 

## 2018-01-13 ENCOUNTER — Ambulatory Visit (HOSPITAL_COMMUNITY): Payer: Medicare HMO | Admitting: Licensed Clinical Social Worker

## 2018-01-20 ENCOUNTER — Ambulatory Visit (HOSPITAL_COMMUNITY): Payer: Medicare HMO | Admitting: Licensed Clinical Social Worker

## 2018-01-20 ENCOUNTER — Ambulatory Visit (INDEPENDENT_AMBULATORY_CARE_PROVIDER_SITE_OTHER): Payer: Medicare HMO | Admitting: Licensed Clinical Social Worker

## 2018-01-20 DIAGNOSIS — F4310 Post-traumatic stress disorder, unspecified: Secondary | ICD-10-CM | POA: Diagnosis not present

## 2018-01-27 ENCOUNTER — Ambulatory Visit (INDEPENDENT_AMBULATORY_CARE_PROVIDER_SITE_OTHER): Payer: Medicare HMO | Admitting: Licensed Clinical Social Worker

## 2018-01-27 DIAGNOSIS — F331 Major depressive disorder, recurrent, moderate: Secondary | ICD-10-CM

## 2018-01-28 ENCOUNTER — Encounter (HOSPITAL_COMMUNITY): Payer: Self-pay | Admitting: Licensed Clinical Social Worker

## 2018-01-28 ENCOUNTER — Ambulatory Visit (HOSPITAL_COMMUNITY): Payer: Medicare HMO | Admitting: Licensed Clinical Social Worker

## 2018-01-28 NOTE — Progress Notes (Signed)
Daily Group progress Note   Program: OP [facility] Evening Group    Group Time: 5:00-7:00pm  Participation Level: Active  Behavioral Response: Appropriate  Type of Therapy:  Psychoeducation/Therapy  Summary of Progress:  Pt participated in a discussion on boundaries, a definition, thoughts and "appropriate" roles in boundaries. Discussed how boundaries are fixed or permeable, implications in the language of boundaries and setting personal limits. Pt discussed the effects of her use of boundaries in personal relationships. Pt was encouraged to continue to use boundaries and setting limits within family relationships.  Ottis Stain, MS, LCAS

## 2018-02-03 ENCOUNTER — Ambulatory Visit (HOSPITAL_COMMUNITY): Payer: Medicare HMO | Admitting: Licensed Clinical Social Worker

## 2018-02-03 NOTE — Progress Notes (Signed)
   THERAPIST PROGRESS NOTE  Session Time: 5pm-6:30pm  Participation Level: Active  Behavioral Response: CasualAlertDysphoric  Type of Therapy: Group Therapy  Treatment Goals addressed: Coping  Interventions: CBT, Motivational Interviewing and Supportive  Summary: Maninder Popelka is a 58 y.o. male who presents with PTSD.   Suicidal/Homicidal: Nowithout intent/plan  Therapist Response: Clinician checked in with group member, assessing for SI/HI/psychosis. Clinician inquired about family interaction over the holiday. Client shared positive and negative experiences and related thoughts and emotions. Client was able to identify with expecting respectful behavior from others in order to feel comfortable sharing. Client participated in group discussion and was able to identify in which situations opening up could be beneficial.   Plan: Return again in 1 weeks.  Diagnosis: Axis I: Post Traumatic Stress Disorder      Harlon Ditty, LCSW 01/20/2018

## 2018-02-05 ENCOUNTER — Ambulatory Visit (INDEPENDENT_AMBULATORY_CARE_PROVIDER_SITE_OTHER): Payer: No Typology Code available for payment source | Admitting: Licensed Clinical Social Worker

## 2018-02-05 ENCOUNTER — Encounter (HOSPITAL_COMMUNITY): Payer: Self-pay | Admitting: Licensed Clinical Social Worker

## 2018-02-05 DIAGNOSIS — F4310 Post-traumatic stress disorder, unspecified: Secondary | ICD-10-CM

## 2018-02-05 NOTE — Progress Notes (Signed)
   THERAPIST PROGRESS NOTE  Session Time: 2:30-3:20pm  Participation Level: Active  Behavioral Response: Casual/Alert/Euthymic  Type of Therapy: Individual Therapy  Treatment Goals addressed:Improve psychiatric symptoms, Controlled Behavior, Moderated Mood, Improve Unhelpful Thought Patterns, Emotional Regulation Skills (Moderate moods, anger management, stress management), Feel and express a full Range of Emotions, Learn about Diagnosis, Healthy Coping Skills, Recall the Traumatic event without being overwhelmed.  Intervention: CBT  Summary: Mark [name] is a 58 y.o. male. Pt presents for his individual counseling session. Pt discussed his psychiatric symptoms and current life events. Pt presents wnl today. He reports he is identifying goals for a "5 year plan." Discussed SWOT with pt in making his plan. Pt discussed the importance of his self preservation and how he works to have it. Pt completed the PCL-5 PTSD screening tool. Pt identified a new hobby: sewing. Asked open ended questions.      Suicidal/Homicidal: Nowithout intent/plan  Therapist Response: Assessed pt's current functioning and and reviewed progress. Assisted pt processing depressive symptoms, coping strategies, , moods, feeling of failure, disabilities.   Assisted pt processing healthy coping skills for the management of his stressors.  Plan: Return again in 1 weeks for individual therapy and OP group weekly [facility] psychiatric [facility]?  Diagnosis: Axis I: PTSD     Reyanne Hussar S, LCAS  02/05/2018

## 2018-02-10 ENCOUNTER — Ambulatory Visit (INDEPENDENT_AMBULATORY_CARE_PROVIDER_SITE_OTHER): Payer: No Typology Code available for payment source | Admitting: Licensed Clinical Social Worker

## 2018-02-10 ENCOUNTER — Ambulatory Visit (HOSPITAL_COMMUNITY): Payer: Medicare HMO | Admitting: Licensed Clinical Social Worker

## 2018-02-10 ENCOUNTER — Encounter (HOSPITAL_COMMUNITY): Payer: Self-pay | Admitting: Licensed Clinical Social Worker

## 2018-02-10 DIAGNOSIS — F331 Major depressive disorder, recurrent, moderate: Secondary | ICD-10-CM

## 2018-02-10 NOTE — Progress Notes (Signed)
   THERAPIST PROGRESS NOTE  Session Time: 10:10-11am  Participation Level: Active  Behavioral Response: Casual/Alert/Depressed  Type of Therapy: Individual Therapy  Treatment Goals addressed:Improve psychiatric symptoms, Controlled Behavior, Moderated Mood, Improve Unhelpful Thought Patterns, Emotional Regulation Skills (Moderate moods, anger management, stress management), Feel and express a full Range of Emotions, Learn about Diagnosis, Healthy Coping Skills, Recall the Traumatic event without being overwhelmed.  Intervention: CBT/Tx plan update  Summary: Kullen Halverson is a 58 y.o. male. Pt presents for his individual counseling session. Pt discussed his psychiatric symptoms and current life events. Pt presents depressed today. "Im in a mood" Asked open ended questions and used empathic reflection. Pt reports he is having more depressed days. Asked pt if he wants to transfer psychiatry here to [facility]. He will contact community care. Pt reports he still has not told his wife he wants a divorce. Discussed his reasoning. Pt reports he continues to use marijuana daily which "helps to relax me and to keep the anxiety at bay." Discussed alternatives to marijuana use. Pt struggles with self care and wants to take care of everyone else. (family), at his own expense. Discussed importance of self care and reasons why... He reports, "im a man and the father and it's my job." Discussed this with pt.  Suicidal/Homicidal: Nowithout intent/plan  Therapist Response: Assessed pt's current functioning and and reviewed progress. Assisted pt processing depressive symptoms, coping strategies, , moods, feeling of failure, disabilities.   Assisted pt processing healthy coping skills for the management of his stressors.  Plan: Return again in 1 weeks for individual therapy and OP group weekly Community Care psychiatric services? Childhood discussion Diagnosis: Axis I: MDD, recurrent episode  moderate    Khriz Liddy S, LCAS  02/10/2018

## 2018-02-17 ENCOUNTER — Ambulatory Visit (HOSPITAL_COMMUNITY): Payer: Medicare HMO | Admitting: Licensed Clinical Social Worker

## 2018-02-17 ENCOUNTER — Encounter (HOSPITAL_COMMUNITY): Payer: Self-pay | Admitting: Licensed Clinical Social Worker

## 2018-02-17 ENCOUNTER — Ambulatory Visit (INDEPENDENT_AMBULATORY_CARE_PROVIDER_SITE_OTHER): Payer: No Typology Code available for payment source | Admitting: Licensed Clinical Social Worker

## 2018-02-17 ENCOUNTER — Ambulatory Visit (INDEPENDENT_AMBULATORY_CARE_PROVIDER_SITE_OTHER): Payer: Medicare HMO | Admitting: Licensed Clinical Social Worker

## 2018-02-17 DIAGNOSIS — F331 Major depressive disorder, recurrent, moderate: Secondary | ICD-10-CM

## 2018-02-17 NOTE — Progress Notes (Signed)
   THERAPIST PROGRESS NOTE  Session Time: 10:10-11am  Participation Level: Active  Behavioral Response: Casual/Alert/Depressed  Type of Therapy: Individual Therapy  Treatment Goals addressed:Improve psychiatric symptoms, Controlled Behavior, Moderated Mood, Improve Unhelpful Thought Patterns, Emotional Regulation Skills (Moderate moods, anger management, stress management), Feel and express a full Range of Emotions, Learn about Diagnosis, Healthy Coping Skills, Recall the Traumatic event without being overwhelmed.  Intervention: CBT/Tx plan update  Summary: Mark [name] is a 58 y.o. male. Pt presents for his individual counseling session. Pt discussed his psychiatric symptoms and current life events. Pt is tired today. He is not sleeping well. Discussed sleep hygiene with pt. Pt reports he is "on edge" and agitated today. Discussed The Cognitive Model with pt changing negative thoughts/emotions/behaviors into positive thoughts/emotions/behaviors. Educated pt on "challenging negative thoughts."   Suicidal/Homicidal: Nowithout intent/plan  Therapist Response: Assessed pt's current functioning and and reviewed progress. Assisted pt processing depressive symptoms, coping strategies, , moods, feeling of failure, disabilities.   Assisted pt processing healthy coping skills for the management of his stressors.  Plan: Return again in 1 weeks for individual therapy and OP group weekly [facility] psychiatric services? Childhood discussion  Diagnosis: Axis I: MDD, recurrent episode moderate    MACKENZIE,LISBETH S, LCAS  02/17/2018

## 2018-02-18 ENCOUNTER — Encounter (HOSPITAL_COMMUNITY): Payer: Self-pay | Admitting: Licensed Clinical Social Worker

## 2018-02-18 NOTE — Progress Notes (Signed)
Daily Group progress Note            Program: OP [facility] Evening Group    Group Time: 5:00-7:00pm  Participation Level: Active  Behavioral Response: Appropriate  Type of Therapy:  Psychoeducation/Therapy  Summary of Progress:  Patient participated in a discussion on challenging negative thoughts. Pt identified irrational negative thoughts experienced. Pt was encouraged to challenge negative thoughts.  Zyhir Cappella, MS, LCAS  

## 2018-02-24 ENCOUNTER — Ambulatory Visit (INDEPENDENT_AMBULATORY_CARE_PROVIDER_SITE_OTHER): Payer: Medicare HMO | Admitting: Licensed Clinical Social Worker

## 2018-02-24 DIAGNOSIS — F331 Major depressive disorder, recurrent, moderate: Secondary | ICD-10-CM

## 2018-02-25 ENCOUNTER — Encounter (HOSPITAL_COMMUNITY): Payer: Self-pay | Admitting: Licensed Clinical Social Worker

## 2018-02-25 ENCOUNTER — Ambulatory Visit (INDEPENDENT_AMBULATORY_CARE_PROVIDER_SITE_OTHER): Payer: No Typology Code available for payment source | Admitting: Licensed Clinical Social Worker

## 2018-02-25 DIAGNOSIS — F4310 Post-traumatic stress disorder, unspecified: Secondary | ICD-10-CM

## 2018-02-25 NOTE — Progress Notes (Signed)
   THERAPIST PROGRESS NOTE  Session Time: 1:15-2:00pm  Participation Level: Active  Behavioral Response: Casual/Alert/Depressed  Type of Therapy: Individual Therapy  Treatment Goals addressed:Improve psychiatric symptoms, Controlled Behavior, Moderated Mood, Improve Unhelpful Thought Patterns, Emotional Regulation Skills (Moderate moods, anger management, stress management), Feel and express a full Range of Emotions, Learn about Diagnosis, Healthy Coping Skills, Recall the Traumatic event without being overwhelmed.  Intervention: CBT/Tx plan update  Summary: Medrick Dehaas is a 58 y.o. male. Pt presents for his individual counseling session. Pt discussed his psychiatric symptoms and current life events. Pt is late due to being at the [facility] today in [place]. Pt presents agitated following a frustrating visit at the [employer]. Continuing a discussion of positive confronting practiced in session with pt to use in frustrating venues. Discussed with pt losing faith and moral dilemmas. Asked open ended questions and used empathic reflection.  Discussed with pt "[facility] injury support group at [employer] in [place]. Discussed benefits.      Suicidal/Homicidal: Nowithout intent/plan  Therapist Response: Assessed pt's current functioning and and reviewed progress. Assisted pt processing agitation, frustration, positive confronting, losing faith and moral dilemmas.   Assisted pt processing healthy coping skills for the management of his stressors.  Plan: Return again in 1 weeks for individual therapy and OP group weekly Community Care psychiatric services? Childhood discussion loss of leg  Diagnosis: Axis I: PTSD  MACKENZIE,LISBETH S, LCAS  02/25/2018

## 2018-02-25 NOTE — Progress Notes (Signed)
Daily Group progress Note            Program: [facility]    Group Time: 5:00-7:00pm  Participation Level: Active  Behavioral Response: Appropriate  Type of Therapy:  Psychoeducation/Therapy  Summary of Progress:  Patient participated in a discussion on confronting adversity "head on." Discussed positive confrontation techniques, confrontation does not have to be confrontational. Suggested to patient to practice positive confrontation techniques to be readily equipped to be more effective in confronting adversity.  Sebastian Lurz, LCAS 

## 2018-03-03 ENCOUNTER — Ambulatory Visit (INDEPENDENT_AMBULATORY_CARE_PROVIDER_SITE_OTHER): Payer: No Typology Code available for payment source | Admitting: Licensed Clinical Social Worker

## 2018-03-03 DIAGNOSIS — F331 Major depressive disorder, recurrent, moderate: Secondary | ICD-10-CM

## 2018-03-04 ENCOUNTER — Encounter (HOSPITAL_COMMUNITY): Payer: Self-pay | Admitting: Licensed Clinical Social Worker

## 2018-03-04 ENCOUNTER — Ambulatory Visit (HOSPITAL_COMMUNITY): Payer: Medicare HMO | Admitting: Licensed Clinical Social Worker

## 2018-03-04 NOTE — Progress Notes (Signed)
  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 Discussion on how the choice of my path has a result and it is my choice now to successfully create the life I want. Pt was encouraged to use his potential in controlling his own destiny.  Darryn Kydd, LCAS 

## 2018-03-10 ENCOUNTER — Ambulatory Visit (INDEPENDENT_AMBULATORY_CARE_PROVIDER_SITE_OTHER): Payer: Medicare HMO | Admitting: Licensed Clinical Social Worker

## 2018-03-10 DIAGNOSIS — F411 Generalized anxiety disorder: Secondary | ICD-10-CM

## 2018-03-10 DIAGNOSIS — F331 Major depressive disorder, recurrent, moderate: Secondary | ICD-10-CM | POA: Diagnosis not present

## 2018-03-10 DIAGNOSIS — F4310 Post-traumatic stress disorder, unspecified: Secondary | ICD-10-CM | POA: Diagnosis not present

## 2018-03-11 ENCOUNTER — Ambulatory Visit (HOSPITAL_COMMUNITY): Payer: Medicare HMO | Admitting: Licensed Clinical Social Worker

## 2018-03-12 ENCOUNTER — Encounter (HOSPITAL_COMMUNITY): Payer: Self-pay | Admitting: Licensed Clinical Social Worker

## 2018-03-12 NOTE — Progress Notes (Signed)
Daily Group progress Note   Program: OP [facility] Evening Group    Group Time: 5:00-7:00pm  Participation Level: Active  Behavioral Response: Appropriate  Type of Therapy: Psychoeducation/Therapy  Summary of Progress:  Patient shared update on how he was reminded of his father many times in the last week, triggering unwanted emotions. Patient identified that writing about himself in two school papers, helped him to cope and sort out those emerging emotions in a healthy manner. Patient identified that one of his coping skills is to use sarcasm, which he had been overly using recently to deal with familial stress. Patient accepted encouragement and feedback from group members. Patient offered support and resources for peers. Counselor mediated discuss.   Hilbert Odor, LCSW

## 2018-03-13 ENCOUNTER — Encounter (HOSPITAL_COMMUNITY): Payer: Self-pay | Admitting: Licensed Clinical Social Worker

## 2018-03-13 ENCOUNTER — Ambulatory Visit (INDEPENDENT_AMBULATORY_CARE_PROVIDER_SITE_OTHER): Payer: No Typology Code available for payment source | Admitting: Licensed Clinical Social Worker

## 2018-03-13 DIAGNOSIS — F331 Major depressive disorder, recurrent, moderate: Secondary | ICD-10-CM

## 2018-03-13 NOTE — Progress Notes (Signed)
   THERAPIST PROGRESS NOTE  Session Time: 10:30-11:00am  Participation Level: Active  Behavioral Response: Casual/Alert/Depressed  Type of Therapy: Individual Therapy  Treatment Goals addressed:Improve psychiatric symptoms, Controlled Behavior, Moderated Mood, Improve Unhelpful Thought Patterns, Emotional Regulation Skills (Moderate moods, anger management, stress management), Feel and express a full Range of Emotions, Learn about Diagnosis, Healthy Coping Skills, Recall the Traumatic event without being overwhelmed.  Intervention: CBT  Summary: Izzac Moulin is a 58 y.o. male. Pt presents for his individual counseling session. Pt discussed his psychiatric symptoms and current life events. Pt is late today, "I forgot I had an appointment today." Discussed benefits of continued therapy. Asked open ended questions about depressive symptoms. Pt discussed how difficult to manage "life" being an amputee, risks of falls, unable to maneuver doorways, inability to go out in the rain. Processed each with pt, and "asking for help."   Suicidal/Homicidal: Nowithout intent/plan  Therapist Response: Assessed pt's current functioning and and reviewed progress. Assisted pt processing frustration, depressive symptoms, benefits of therapy, managing life as an amputee.     Assisted pt processing healthy coping skills for the management of his stressors.  Plan: Return again in 1 weeks for individual therapy and OP group weekly Community Care psychiatric services? Childhood discussion loss of leg  Diagnosis: Axis I: PTSD  MACKENZIE,LISBETH S, LCAS  03/13/2018

## 2018-03-17 ENCOUNTER — Ambulatory Visit (INDEPENDENT_AMBULATORY_CARE_PROVIDER_SITE_OTHER): Payer: No Typology Code available for payment source | Admitting: Licensed Clinical Social Worker

## 2018-03-17 DIAGNOSIS — F331 Major depressive disorder, recurrent, moderate: Secondary | ICD-10-CM | POA: Diagnosis not present

## 2018-03-18 ENCOUNTER — Ambulatory Visit (HOSPITAL_COMMUNITY): Payer: Medicare HMO | Admitting: Licensed Clinical Social Worker

## 2018-03-18 ENCOUNTER — Encounter (HOSPITAL_COMMUNITY): Payer: Self-pay | Admitting: Licensed Clinical Social Worker

## 2018-03-18 NOTE — Progress Notes (Signed)
  Daily Group progress Note            Program: OP [facility] Group    Group Time: 5:00-7:00pm  Participation Level: Active  Behavioral Response: Appropriate  Type of Therapy:  Psychoeducation/Therapy  Summary of Progress:  Patient participated in a discussion on relationships (spousal, parent/child, intimate, partnership) where the group discussed effective communication, I-statements and fair fighting. Pt was encouraged to work on his current relationship using effective communication including I statements and fair fighting rules.   Jerett Odonohue S. Jayten Gabbard, LCAS  

## 2018-03-24 ENCOUNTER — Ambulatory Visit (INDEPENDENT_AMBULATORY_CARE_PROVIDER_SITE_OTHER): Payer: No Typology Code available for payment source | Admitting: Licensed Clinical Social Worker

## 2018-03-24 DIAGNOSIS — F331 Major depressive disorder, recurrent, moderate: Secondary | ICD-10-CM

## 2018-03-25 ENCOUNTER — Encounter (HOSPITAL_COMMUNITY): Payer: Self-pay | Admitting: Licensed Clinical Social Worker

## 2018-03-25 NOTE — Progress Notes (Signed)
Daily Group progress Note           Program: OP [facility]    Group Time: 6:30-7:15pm  Participation Level: Active  Behavioral Response: Appropriate  Type of Therapy:  Psychoeducation/Therapy  Summary of Progress:  Patient participated in a discussion on coping tools for anxiety, depression and PTSD. The strong symptoms in these disorders often lead people to rely on unhealthy ways of coping. Patient participated in an "index card" activity for coping tools. Patient was encouraged to use these healthier ways of coping, which may help the symptoms go down in intensity, become less frequent and become tolerable.  Ottis Stain, LCAS

## 2018-03-26 ENCOUNTER — Encounter (HOSPITAL_COMMUNITY): Payer: Self-pay | Admitting: Licensed Clinical Social Worker

## 2018-03-26 ENCOUNTER — Ambulatory Visit (INDEPENDENT_AMBULATORY_CARE_PROVIDER_SITE_OTHER): Payer: No Typology Code available for payment source | Admitting: Licensed Clinical Social Worker

## 2018-03-26 DIAGNOSIS — F4310 Post-traumatic stress disorder, unspecified: Secondary | ICD-10-CM | POA: Diagnosis not present

## 2018-03-26 DIAGNOSIS — F411 Generalized anxiety disorder: Secondary | ICD-10-CM

## 2018-03-26 DIAGNOSIS — F331 Major depressive disorder, recurrent, moderate: Secondary | ICD-10-CM

## 2018-03-26 NOTE — Progress Notes (Signed)
   THERAPIST PROGRESS NOTE  Session Time: 10:10-11:00am  Participation Level: Active  Behavioral Response: Casual/Alert/Depressed  Type of Therapy: Individual Therapy  Treatment Goals addressed:Improve psychiatric symptoms, Controlled Behavior, Moderated Mood, Improve Unhelpful Thought Patterns, Emotional Regulation Skills (Moderate moods, anger management, stress management), Feel and express a full Range of Emotions, Learn about Diagnosis, Healthy Coping Skills, Recall the Traumatic event without being overwhelmed.  Intervention: CBT  Summary: Lonzie Toran is a 58 y.o. male. Pt presents for his individual counseling session. Pt discussed his psychiatric symptoms and current life events. Pt shares his continued depressive symptoms. Used CBT  to assist pt in stopping negative cycles of negative thoughts that make him feel bad, anxious or scared, making his  problems more manageable. Used  CBT to assist pt ini changing his negative thought patterns and improve the way he feels.   Suicidal/Homicidal: Nowithout intent/plan  Therapist Response: Assessed pt's current functioning and and reviewed progress. Assisted pt processing depressive symptoms, used cbt to assist pt in managing negative thoughts.     Assisted pt processing healthy coping skills for the management of his stressors.  Plan: Return again in 1 weeks for individual therapy and OP group weekly Community Care psychiatric services? Childhood discussion loss of leg  Diagnosis: Axis I: MDD, PTSD, GAD  Newton Frutiger S, LCAS  03/26/2018

## 2018-03-31 ENCOUNTER — Ambulatory Visit (INDEPENDENT_AMBULATORY_CARE_PROVIDER_SITE_OTHER): Payer: No Typology Code available for payment source | Admitting: Licensed Clinical Social Worker

## 2018-03-31 DIAGNOSIS — F331 Major depressive disorder, recurrent, moderate: Secondary | ICD-10-CM | POA: Diagnosis not present

## 2018-04-01 ENCOUNTER — Encounter (HOSPITAL_COMMUNITY): Payer: Self-pay | Admitting: Licensed Clinical Social Worker

## 2018-04-01 NOTE — Progress Notes (Signed)
  Daily Group Progress Note       Program: OP [facility] Evening Group    Group Time: 5:00-7:15pm  Participation Level: Active  Behavioral Response: Appropriate  Type of Therapy:  Psychoeducation/Therapy  Summary of Progress:  Patient participated in a discussion on feeling emotionally overwhelmed. Patient engaged in a"Learning to self-soothe" activity, with handout and soothing activity. Patient was encouraged to use the self-soothing activities at home between sessions when feeling overwhelmed emotionally.  Ottis Stain, LCAS

## 2018-04-03 ENCOUNTER — Other Ambulatory Visit: Payer: Self-pay

## 2018-04-03 ENCOUNTER — Encounter (HOSPITAL_COMMUNITY): Payer: Self-pay | Admitting: Licensed Clinical Social Worker

## 2018-04-03 ENCOUNTER — Ambulatory Visit (INDEPENDENT_AMBULATORY_CARE_PROVIDER_SITE_OTHER): Payer: No Typology Code available for payment source | Admitting: Licensed Clinical Social Worker

## 2018-04-03 DIAGNOSIS — F411 Generalized anxiety disorder: Secondary | ICD-10-CM

## 2018-04-03 DIAGNOSIS — F331 Major depressive disorder, recurrent, moderate: Secondary | ICD-10-CM

## 2018-04-03 NOTE — Progress Notes (Signed)
   THERAPIST PROGRESS NOTE  Session Time: 2:45-3:40pm  Participation Level: Active  Behavioral Response: Casual/Alert/Depressed/Anxious  Type of Therapy: Individual Therapy  Treatment Goals addressed:Improve psychiatric symptoms, Controlled Behavior, Moderated Mood, Improve Unhelpful Thought Patterns, Emotional Regulation Skills (Moderate moods, anger management, stress management), Feel and express a full Range of Emotions, Learn about Diagnosis, Healthy Coping Skills, Recall the Traumatic event without being overwhelmed.  Intervention: CBT  Summary: Nashid Menze is a 58 y.o. male. Pt presents for his individual counseling session. Pt discussed his psychiatric symptoms and current life events. Pt shares his continued depressive symptoms, but they are not as bad as previously felt. "My anxiety is worse, though. [facility] Lolly Mustache to see a psychiatrist here. Assisted pt in developing behavior and cognitive strategies to reduce his anxiety. Gave pt [employer] for anxiety.    Suicidal/Homicidal: Nowithout intent/plan  Therapist Response: Assessed pt's current functioning and and reviewed progress. Assisted pt processing anxiety and depressive symptoms,assisted pt in developing behavioral and cognitive strategies to reduce anxiety. Ted talks.     Assisted pt processing healthy coping skills for the management of his stressors.  Plan: Return again in 1 weeks for individual therapy and OP group weekly Community Care psychiatric services? Childhood discussion loss of leg  Diagnosis: Axis I: MDD, PTSD, GAD  MACKENZIE,LISBETH S, LCAS  04/03/2018

## 2018-04-07 ENCOUNTER — Ambulatory Visit (INDEPENDENT_AMBULATORY_CARE_PROVIDER_SITE_OTHER): Payer: Medicare HMO | Admitting: Licensed Clinical Social Worker

## 2018-04-07 ENCOUNTER — Other Ambulatory Visit: Payer: Self-pay

## 2018-04-07 DIAGNOSIS — F331 Major depressive disorder, recurrent, moderate: Secondary | ICD-10-CM | POA: Diagnosis not present

## 2018-04-08 ENCOUNTER — Encounter (HOSPITAL_COMMUNITY): Payer: Self-pay | Admitting: Licensed Clinical Social Worker

## 2018-04-08 NOTE — Progress Notes (Signed)
Daily Group progress Note      Program: OP [facility] Evening Group    Group Time: 5:00-7:15pm  Participation Level: Active  Behavioral Response: Appropriate  Type of Therapy:  Psychoeducation/Therapy  Summary of Progress:  Patient participated in a discussion how to deal with current life events. Patient participated in a discussion the importance of grounding techniques with handout.  Patient was encouraged to use these techniques to deal with current life events.  Lino Wickliff, LCAS  

## 2018-04-10 ENCOUNTER — Ambulatory Visit (HOSPITAL_COMMUNITY): Payer: Medicare HMO | Admitting: Licensed Clinical Social Worker

## 2018-04-14 ENCOUNTER — Other Ambulatory Visit: Payer: Self-pay

## 2018-04-14 ENCOUNTER — Ambulatory Visit (INDEPENDENT_AMBULATORY_CARE_PROVIDER_SITE_OTHER): Payer: Medicare HMO | Admitting: Licensed Clinical Social Worker

## 2018-04-14 DIAGNOSIS — F331 Major depressive disorder, recurrent, moderate: Secondary | ICD-10-CM | POA: Diagnosis not present

## 2018-04-15 ENCOUNTER — Encounter (HOSPITAL_COMMUNITY): Payer: Self-pay | Admitting: Licensed Clinical Social Worker

## 2018-04-15 NOTE — Progress Notes (Signed)
Virtual Visit via Video Note  I connected with [name] on 04/15/18 at  5:00 PM EDT by a video enabled telemedicine application and verified that I am speaking with the correct person using two identifiers.   I discussed the limitations of evaluation and management by telemedicine and the availability of in person appointments. The patient expressed understanding and agreed to proceed.  History of Present Illness: Patient was referred to OP therapy by the [facility]    Observations/Objective:   Assessment and Plan  Summary of Progress:  Patient consented to telemedicine for veteran's group this evening. Patient participated in a discussion on telemedicine instead of in-person group therapy. Patient participated in a discussion on handling stress while being quarantined during the virus. Patient contributed to the conversation by sharing what coping skill works during this stressful time. Patient was encouraged to use the new coping skills discussed during group. Ottis Stain, LCAS   Follow Up Instructions: Continue with OP therapy via webex    The patient was provided an opportunity to ask questions and all were answered. The patient agreed with the plan and demonstrated an understanding of the instructions.   The patient was advised to call back or seek an in-person evaluation if the symptoms worsen or if the condition fails to improve as anticipated.  I provided 120 minutes of non-face-to-face time during this encounter.   Jamarie Mussa S, LCAS

## 2018-04-17 ENCOUNTER — Ambulatory Visit (HOSPITAL_COMMUNITY): Payer: Medicare HMO | Admitting: Licensed Clinical Social Worker

## 2018-04-21 ENCOUNTER — Ambulatory Visit (INDEPENDENT_AMBULATORY_CARE_PROVIDER_SITE_OTHER): Payer: Medicare HMO | Admitting: Licensed Clinical Social Worker

## 2018-04-21 ENCOUNTER — Other Ambulatory Visit: Payer: Self-pay

## 2018-04-21 DIAGNOSIS — F331 Major depressive disorder, recurrent, moderate: Secondary | ICD-10-CM | POA: Diagnosis not present

## 2018-04-22 ENCOUNTER — Encounter (HOSPITAL_COMMUNITY): Payer: Self-pay | Admitting: Licensed Clinical Social Worker

## 2018-04-22 NOTE — Progress Notes (Signed)
Virtual Visit via Video Note  I connected with Mark [name] on 04/22/18 at  5:00 PM EDT by a video enabled telemedicine application and verified that I am speaking with the correct person using two identifiers.   I discussed the limitations of evaluation and management by telemedicine and the availability of in person appointments. The patient expressed understanding and agreed to proceed.  History of Present Illness: Pt was referred by [facility] community care for depression and ptsd.     Observations/Objective: Pt presents anxious via webex counseling session. Objective:  Elevate mood  Patient consented to telemedicine for veteran'[name] group this evening. Patient participated in a discussion on excessive worry. Patients identified his symptoms of worry and anxiety. Patient was encouraged to acknowledge his symptoms of worry and anxiety and use coping skills that are new and those that have worked in the past. Mark [name], LCAS  Assessment and Plan:  Continue to see pt in individual and group therapy sessions weekly. Monitor pt'[name] depressive symptoms.   Follow Up Instructions:    I discussed the assessment and treatment plan with the patient. The patient was provided an opportunity to ask questions and all were answered. The patient agreed with the plan and demonstrated an understanding of the instructions.   The patient was advised to call back or seek an in-person evaluation if the symptoms worsen or if the condition fails to improve as anticipated.  I provided 120 minutes of non-face-to-face time during this encounter.   Mark [name], LCAS

## 2018-04-24 ENCOUNTER — Ambulatory Visit (INDEPENDENT_AMBULATORY_CARE_PROVIDER_SITE_OTHER): Payer: No Typology Code available for payment source | Admitting: Licensed Clinical Social Worker

## 2018-04-24 ENCOUNTER — Other Ambulatory Visit: Payer: Self-pay

## 2018-04-24 DIAGNOSIS — F331 Major depressive disorder, recurrent, moderate: Secondary | ICD-10-CM | POA: Diagnosis not present

## 2018-04-28 ENCOUNTER — Ambulatory Visit (INDEPENDENT_AMBULATORY_CARE_PROVIDER_SITE_OTHER): Payer: No Typology Code available for payment source | Admitting: Licensed Clinical Social Worker

## 2018-04-28 ENCOUNTER — Encounter (HOSPITAL_COMMUNITY): Payer: Self-pay | Admitting: Licensed Clinical Social Worker

## 2018-04-28 ENCOUNTER — Other Ambulatory Visit: Payer: Self-pay

## 2018-04-28 DIAGNOSIS — F411 Generalized anxiety disorder: Secondary | ICD-10-CM

## 2018-04-28 DIAGNOSIS — F331 Major depressive disorder, recurrent, moderate: Secondary | ICD-10-CM

## 2018-04-28 NOTE — Progress Notes (Signed)
Virtual Visit via Video Note  I connected with Mark [name] on 04/28/18 at  5:00 PM EDT by a video enabled telemedicine application and verified that I am speaking with the correct person using two identifiers.   I discussed the limitations of evaluation and management by telemedicine and the availability of in person appointments. The patient expressed understanding and agreed to proceed.  History of Present Illness: Pt was referred by [name] community care for depression and ptsd.     Observations/Objective: Pt presents anxious via webex counseling session. Objective:  Elevate mood  Patient participated in a discussion on Socratic Questions, thoughts running fast, no time to question them. Educated patient on challenging negative thoughts, thoughts determine feelings, actions. Pt was encouraged to challenge negative thoughts before they take action.   Assessment and Plan:  Continue to see pt in individual and group therapy sessions weekly. Monitor pt's depressive symptoms.   Follow Up Instructions:    I discussed the assessment and treatment plan with the patient. The patient was provided an opportunity to ask questions and all were answered. The patient agreed with the plan and demonstrated an understanding of the instructions.   The patient was advised to call back or seek an in-person evaluation if the symptoms worsen or if the condition fails to improve as anticipated.  I provided 60 minutes of non-face-to-face time during this encounter.   MACKENZIE,LISBETH S, LCAS

## 2018-04-28 NOTE — Progress Notes (Signed)
Virtual Visit via Video Note  I connected with [name] on 04/24/18 at 10:00 AM EDT by a video enabled telemedicine application and verified that I am speaking with the correct person using two identifiers.   I discussed the limitations of evaluation and management by telemedicine and the availability of in person appointments. The patient expressed understanding and agreed to proceed.  History of Present Illness:Pt was referred to [facility] OP therapy for PTSD, depression and anxiety by the [facility].    Observations/Objective: Reduce irritability. Pt presents depressed and irritable during session.   Assessment and Plan: Today is pt's 25th wedding anniversary. He and his wife are separated and probably moving towards divorce. "I feel like a failure." Used socratic questions to assist reframing his negative thoughts about himself. Emailed worksheet on socratic questions.  Follow Up Instructions:   I discussed the assessment and treatment plan with the patient. The patient was provided an opportunity to ask questions and all were answered. The patient agreed with the plan and demonstrated an understanding of the instructions.   The patient was advised to call back or seek an in-person evaluation if the symptoms worsen or if the condition fails to improve as anticipated.  I provided 50 minutes of non-face-to-face time during this encounter.   Oluwatobi Ruppe S, LCAS

## 2018-05-01 ENCOUNTER — Other Ambulatory Visit: Payer: Self-pay

## 2018-05-01 ENCOUNTER — Ambulatory Visit (HOSPITAL_COMMUNITY): Payer: Medicare HMO | Admitting: Licensed Clinical Social Worker

## 2018-05-01 ENCOUNTER — Telehealth (HOSPITAL_COMMUNITY): Payer: Self-pay | Admitting: Licensed Clinical Social Worker

## 2018-05-01 NOTE — Telephone Encounter (Signed)
Pt called in at 11:45am, having missed his 11am webex appt. Contact if any openings this afternoon.  Ottis Stain, LCAS

## 2018-05-05 ENCOUNTER — Ambulatory Visit (INDEPENDENT_AMBULATORY_CARE_PROVIDER_SITE_OTHER): Payer: Medicare HMO | Admitting: Licensed Clinical Social Worker

## 2018-05-05 ENCOUNTER — Other Ambulatory Visit: Payer: Self-pay

## 2018-05-05 ENCOUNTER — Encounter (HOSPITAL_COMMUNITY): Payer: Self-pay | Admitting: Licensed Clinical Social Worker

## 2018-05-05 DIAGNOSIS — F331 Major depressive disorder, recurrent, moderate: Secondary | ICD-10-CM | POA: Diagnosis not present

## 2018-05-05 NOTE — Progress Notes (Signed)
Virtual Visit via Video Note  I connected with [name] on 05/05/18 at  5:00 PM EDT by a video enabled telemedicine application and verified that I am speaking with the correct person using two identifiers.   I discussed the limitations of evaluation and management by telemedicine and the availability of in person appointments. The patient expressed understanding and agreed to proceed.  History of Present Illness: Pt was referred by [name] community care for depression and ptsd.     Observations/Objective: Pt presents anxious via webex counseling session. 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 Pt participated in a discussion on gratitude, naming 3 good things that happened this week, especially in this time of pandemic with everything seeming so bleak now.   Assessment and Plan:  Continue to see pt in individual and group therapy sessions weekly. Monitor pt's depressive symptoms.   Follow Up Instructions:    I discussed the assessment and treatment plan with the patient. The patient was provided an opportunity to ask questions and all were answered. The patient agreed with the plan and demonstrated an understanding of the instructions.   The patient was advised to call back or seek an in-person evaluation if the symptoms worsen or if the condition fails to improve as anticipated.  I provided 120 minutes of non-face-to-face time during this encounter.   MACKENZIE,LISBETH S, LCAS

## 2018-05-08 ENCOUNTER — Ambulatory Visit (INDEPENDENT_AMBULATORY_CARE_PROVIDER_SITE_OTHER): Payer: No Typology Code available for payment source | Admitting: Licensed Clinical Social Worker

## 2018-05-08 ENCOUNTER — Other Ambulatory Visit: Payer: Self-pay

## 2018-05-08 ENCOUNTER — Encounter (HOSPITAL_COMMUNITY): Payer: Self-pay | Admitting: Licensed Clinical Social Worker

## 2018-05-08 DIAGNOSIS — F331 Major depressive disorder, recurrent, moderate: Secondary | ICD-10-CM

## 2018-05-08 DIAGNOSIS — F411 Generalized anxiety disorder: Secondary | ICD-10-CM | POA: Diagnosis not present

## 2018-05-08 NOTE — Progress Notes (Signed)
Virtual Visit via Video Note  I connected with Mark [name] on 05/08/18 at 11:00 AM EDT by a video enabled telemedicine application and verified that I am speaking with the correct person using two identifiers.   I discussed the limitations of evaluation and management by telemedicine and the availability of in person appointments. The patient expressed understanding and agreed to proceed.  History of Present Illness:Pt was referred to [facility] OP therapy for PTSD, depression and anxiety by the [facility].    Observations/Objective: Reduce irritability. Pt presents depressed and irritable during webex therapy session.   Assessment and Plan: .Pt discussed his psychiatric symptoms and current life events. "I'm struggling throughout this pandemic."  Pt identified all his emotions and feelings during this time. Discussed emotions vs feelings. Pt continues with negative thoughts, "Im spending too much time alone and in my thoughts."  Used socratic questions to assist reframing his negative thoughts about himself. Went through the worksheet with pt, reframing his thought processes. Discussed coping skills with pt. Sent him a list of coping skills via email.    Follow Up Instructions:   I discussed the assessment and treatment plan with the patient. The patient was provided an opportunity to ask questions and all were answered. The patient agreed with the plan and demonstrated an understanding of the instructions.   The patient was advised to call back or seek an in-person evaluation if the symptoms worsen or if the condition fails to improve as anticipated.  I provided 50 minutes of non-face-to-face time during this encounter.   Irmalee Riemenschneider S, LCAS

## 2018-05-12 ENCOUNTER — Ambulatory Visit (INDEPENDENT_AMBULATORY_CARE_PROVIDER_SITE_OTHER): Payer: Medicare HMO | Admitting: Licensed Clinical Social Worker

## 2018-05-12 ENCOUNTER — Other Ambulatory Visit: Payer: Self-pay

## 2018-05-12 ENCOUNTER — Encounter (HOSPITAL_COMMUNITY): Payer: Self-pay | Admitting: Licensed Clinical Social Worker

## 2018-05-12 DIAGNOSIS — F331 Major depressive disorder, recurrent, moderate: Secondary | ICD-10-CM

## 2018-05-12 NOTE — Progress Notes (Signed)
Virtual Visit via Video Note  I connected with Mark [name] on 05/12/18 at  5:00 PM EDT by a video enabled telemedicine application and verified that I am speaking with the correct person using two identifiers.   I discussed the limitations of evaluation and management by telemedicine and the availability of in person appointments. The patient expressed understanding and agreed to proceed.  History of Present Illness: Pt was referred by [facility] community care for depression and ptsd.     Observations/Objective: Pt presents  via webex depressed during the counseling session.    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 Pt participated in a discussion on wearing masks during the covid-19 virus mandatory mask regulation. 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 Group discussion ensued with a sharing of emotions and thoughts about the mandatory mask regulation. Pt was encouraged to be safe during the epidemic. Pt participated in a grounding exercise.   Assessment and Plan:  Continue to see pt in individual and group therapy sessions weekly. Monitor pt's depressive symptoms.   Follow Up Instructions:    I discussed the assessment and treatment plan with the patient. The patient was provided an opportunity to ask questions and all were answered. The patient agreed with the plan and demonstrated an understanding of the instructions.   The patient was advised to call back or seek an in-person evaluation if the symptoms worsen or if the condition fails to improve as anticipated.  I provided 120 minutes of non-face-to-face time during this encounter.   MACKENZIE,LISBETH S, LCAS

## 2018-05-15 ENCOUNTER — Encounter (HOSPITAL_COMMUNITY): Payer: Self-pay | Admitting: Licensed Clinical Social Worker

## 2018-05-15 ENCOUNTER — Other Ambulatory Visit: Payer: Self-pay

## 2018-05-15 ENCOUNTER — Ambulatory Visit (INDEPENDENT_AMBULATORY_CARE_PROVIDER_SITE_OTHER): Payer: No Typology Code available for payment source | Admitting: Licensed Clinical Social Worker

## 2018-05-15 DIAGNOSIS — F331 Major depressive disorder, recurrent, moderate: Secondary | ICD-10-CM | POA: Diagnosis not present

## 2018-05-15 DIAGNOSIS — F4310 Post-traumatic stress disorder, unspecified: Secondary | ICD-10-CM | POA: Diagnosis not present

## 2018-05-15 NOTE — Progress Notes (Signed)
Virtual Visit via Video Note  I connected with Mark [name] on 05/15/18 at  9:00 AM EDT by a video enabled telemedicine application and verified that I am speaking with the correct person using two identifiers.   I discussed the limitations of evaluation and management by telemedicine and the availability of in person appointments. The patient expressed understanding and agreed to proceed.  History of Present Illness:Pt was referred to [facility] OP therapy for PTSD, depression and anxiety by the [facility].    Observations/Objective: Reduce irritability. Describe signs and symptoms of depression. Pt presents depressed and irritable during webex therapy session.   Assessment and Plan: .Pt discussed his psychiatric symptoms and current life events. " I got approved for an additional day for my Aide, due to my mental state and feelings of being overwhelmed now 5 days/week. I continue to struggle throughout this pandemic. My depression continues to get worse. I think of self harm, but I won't do it. My anxiety has increased due to my children. They are not listening to me and putting themselves in difficult situations." Pt completed the Socratic Questions homework worksheet. Reviewed and processed with pt. Reviewed tx plan. Discussed the benefits of PHP program. "I'm interested in trying anything that may help me." "I just want to feel less depressed, have the ability to deal with my unknown triggers, identify my feelings and emotions in order to manage the interpretation, be less irritable, tense and anxious and nonreactive.or over reactive and manage frustrations." Told pt I am referring him to [facility].  Follow Up Instructions:   I discussed the assessment and treatment plan with the patient. The patient was provided an opportunity to ask questions and all were answered. The patient agreed with the plan and demonstrated an understanding of the instructions.   The patient was advised to call back or seek  an in-person evaluation if the symptoms worsen or if the condition fails to improve as anticipated.  I provided 50 minutes of non-face-to-face time during this encounter.   MACKENZIE,LISBETH S, LCAS

## 2018-05-19 ENCOUNTER — Other Ambulatory Visit: Payer: Self-pay

## 2018-05-19 ENCOUNTER — Encounter (HOSPITAL_COMMUNITY): Payer: Self-pay | Admitting: Licensed Clinical Social Worker

## 2018-05-19 ENCOUNTER — Ambulatory Visit (INDEPENDENT_AMBULATORY_CARE_PROVIDER_SITE_OTHER): Payer: Medicare HMO | Admitting: Licensed Clinical Social Worker

## 2018-05-19 DIAGNOSIS — F331 Major depressive disorder, recurrent, moderate: Secondary | ICD-10-CM

## 2018-05-19 DIAGNOSIS — F4310 Post-traumatic stress disorder, unspecified: Secondary | ICD-10-CM | POA: Diagnosis not present

## 2018-05-19 NOTE — Progress Notes (Signed)
Virtual Visit via Video Note  I connected with Mark [name] on 05/19/18 at  5:00 PM EDT by a video enabled telemedicine application and verified that I am speaking with the correct person using two identifiers.   I discussed the limitations of evaluation and management by telemedicine and the availability of in person appointments. The patient expressed understanding and agreed to proceed.  History of Present Illness: Pt was referred by [facility] community care for depression and ptsd.     Observations/Objective: Pt presents  via webex depressed during the counseling session.    Observations/Objective: Pt participated in a discussion on vulnerability, especially as a veteran. While in the [employer], you cannot lead with emotion. 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 These concepts were discussed by each veteran. 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       Follow Up Instructions:    I discussed the assessment and treatment plan with the patient. The patient was provided an opportunity to ask questions and all were answered. The patient agreed with the plan and demonstrated an understanding of the instructions.   The patient was advised to call back or seek an in-person evaluation if the symptoms worsen or if the condition fails to improve as anticipated.  I provided 90 minutes of non-face-to-face time during this encounter.   MACKENZIE,LISBETH S, LCAS

## 2018-05-20 ENCOUNTER — Telehealth (HOSPITAL_COMMUNITY): Payer: Self-pay | Admitting: Professional

## 2018-05-20 ENCOUNTER — Ambulatory Visit (INDEPENDENT_AMBULATORY_CARE_PROVIDER_SITE_OTHER): Payer: No Typology Code available for payment source | Admitting: Licensed Clinical Social Worker

## 2018-05-20 ENCOUNTER — Other Ambulatory Visit: Payer: Self-pay

## 2018-05-20 ENCOUNTER — Encounter (HOSPITAL_COMMUNITY): Payer: Self-pay | Admitting: Licensed Clinical Social Worker

## 2018-05-20 DIAGNOSIS — F331 Major depressive disorder, recurrent, moderate: Secondary | ICD-10-CM | POA: Diagnosis not present

## 2018-05-20 NOTE — Progress Notes (Signed)
Virtual Visit via Video Note  I connected with Mark [name] on 05/20/18 at  1:00 PM EDT by a video enabled telemedicine application and verified that I am speaking with the correct person using two identifiers.   I discussed the limitations of evaluation and management by telemedicine and the availability of in person appointments. The patient expressed understanding and agreed to proceed.  History of Present Illness:Pt was referred to [facility] OP therapy for PTSD, depression and anxiety by the [facility].    Observations/Objective: Reduce irritability. Describe signs and symptoms of depression. Pt presents depressed  during webex therapy session.   Assessment and Plan: .Pt discussed his psychiatric symptoms and current life events. Pt was tearful initially during the webex therapy session. "Why can't I have just a few good days?" Asked open ended questions. His L[employer] insurance is cutting his benefits 75%. Asked open ended questions and used empathic reflection.  Assisted pt processing how this will change his life. Assisted pt using problem solving skills. "I don't want to move back to [place] and live with my wife. I will be more depressed"Discussed options. Discussed the PHP program with pt. He is scheduled to complete the assessment tomorrow. Will continue to see pt individually and [facility] group weekly.   Follow Up Instructions:   I discussed the assessment and treatment plan with the patient. The patient was provided an opportunity to ask questions and all were answered. The patient agreed with the plan and demonstrated an understanding of the instructions.   The patient was advised to call back or seek an in-person evaluation if the symptoms worsen or if the condition fails to improve as anticipated.  I provided 50 minutes of non-face-to-face time during this encounter.   MACKENZIE,LISBETH S, LCAS

## 2018-05-21 ENCOUNTER — Other Ambulatory Visit (HOSPITAL_COMMUNITY)
Payer: No Typology Code available for payment source | Attending: Licensed Clinical Social Worker | Admitting: Licensed Clinical Social Worker

## 2018-05-21 ENCOUNTER — Other Ambulatory Visit: Payer: Self-pay

## 2018-05-21 DIAGNOSIS — F411 Generalized anxiety disorder: Secondary | ICD-10-CM | POA: Insufficient documentation

## 2018-05-21 DIAGNOSIS — F332 Major depressive disorder, recurrent severe without psychotic features: Secondary | ICD-10-CM | POA: Insufficient documentation

## 2018-05-21 DIAGNOSIS — F4310 Post-traumatic stress disorder, unspecified: Secondary | ICD-10-CM | POA: Diagnosis not present

## 2018-05-21 DIAGNOSIS — E119 Type 2 diabetes mellitus without complications: Secondary | ICD-10-CM | POA: Insufficient documentation

## 2018-05-21 DIAGNOSIS — I10 Essential (primary) hypertension: Secondary | ICD-10-CM | POA: Diagnosis not present

## 2018-05-21 DIAGNOSIS — R4589 Other symptoms and signs involving emotional state: Secondary | ICD-10-CM | POA: Insufficient documentation

## 2018-05-21 DIAGNOSIS — Z89611 Acquired absence of right leg above knee: Secondary | ICD-10-CM | POA: Diagnosis not present
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
## 2018-05-22 ENCOUNTER — Other Ambulatory Visit (HOSPITAL_COMMUNITY): Payer: No Typology Code available for payment source | Admitting: Licensed Clinical Social Worker

## 2018-05-22 ENCOUNTER — Encounter (HOSPITAL_COMMUNITY): Payer: Self-pay | Admitting: Occupational Therapy

## 2018-05-22 ENCOUNTER — Other Ambulatory Visit: Payer: Self-pay

## 2018-05-22 ENCOUNTER — Encounter (HOSPITAL_COMMUNITY): Payer: Self-pay | Admitting: Professional

## 2018-05-22 ENCOUNTER — Other Ambulatory Visit (HOSPITAL_COMMUNITY): Payer: No Typology Code available for payment source | Admitting: Occupational Therapy

## 2018-05-22 ENCOUNTER — Ambulatory Visit (HOSPITAL_COMMUNITY): Payer: Medicare HMO | Admitting: Licensed Clinical Social Worker

## 2018-05-22 VITALS — Ht 74.0 in | Wt 300.0 lb

## 2018-05-22 DIAGNOSIS — F4310 Post-traumatic stress disorder, unspecified: Secondary | ICD-10-CM

## 2018-05-22 DIAGNOSIS — E119 Type 2 diabetes mellitus without complications: Secondary | ICD-10-CM | POA: Diagnosis not present

## 2018-05-22 DIAGNOSIS — I10 Essential (primary) hypertension: Secondary | ICD-10-CM | POA: Diagnosis not present

## 2018-05-22 DIAGNOSIS — F411 Generalized anxiety disorder: Secondary | ICD-10-CM

## 2018-05-22 DIAGNOSIS — F332 Major depressive disorder, recurrent severe without psychotic features: Secondary | ICD-10-CM | POA: Diagnosis not present

## 2018-05-22 DIAGNOSIS — Z89611 Acquired absence of right leg above knee: Secondary | ICD-10-CM | POA: Diagnosis not present

## 2018-05-22 DIAGNOSIS — R4589 Other symptoms and signs involving emotional state: Secondary | ICD-10-CM | POA: Diagnosis not present

## 2018-05-22 NOTE — Progress Notes (Signed)
Met with patient as he presented with restricted affect, depressed mood but denied any current suicidal or homicidal ideations, no auditory or visual hallucinations and no plans or intent to want to harm self or others at this time.  Patient reported he was referred to [facility] from his primary therapist, Binnie Rail that he sees once a week and also does a [place] support group with due to his worsening depression and anxiety since COVID-19 restrictions went into place.  Patient reported it has been very difficult on him being alone most of the time and not getting to go out and to be around other people.  States he is an only child so he grew up having to be alone a lot and does not like that feeling.  Reports he is now even doing all therapy and groups by virtual video so he acknowledged he misses being around the members of a group and just the interactions that he does not get from being at home.  Patient reported he does have an aid come in to help him 5 days a week for a few hours a day so he does have some socialization.  Reports having his dog is also a huge support and companion and that he does try to take the dog out some for a walk.  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  Reports at first problems started with his right leg from a [place] accident but then got numerous infections with 40 surgeries so he finally had the leg amputated in 2015.  States he does have constant pain in the leg that he states for the most part "I have just gotten use to".  Patient reported his pain at a 7-8 today on a scale of 0-10 with 0 being none and 10 the worst he could manage.  Reports he uses Percocet medications when pain gets to a 9 or 10 so this is 1-2 times a day on average.  Patient reports recently his primacy care provider at the [facility] put him on Hydroxyzine 25 mg, 1-2 tablets twice a day.  Says this helps some with anxiety but that it knocks him out and  makes him feel hungover after use.  Patient also reported taking Trazodone and Prazosin medication at night to help with sleep.  Reports sleep is up and down and lately has been sleeping more in his recliner.  Patient reported he has also gained 20-30 pounds in the past 2 months since being home more with COVID-19 restrictions and this has made him more depressed to as he feels he has taken steps backwards and was doing better with his weight, with working out some and taking better care of himself.  Patient scored a 24 on his PHQ9 depression screening today and rated his depression a 9 and anxiety a 9 on a scale of 0-10 with 0 being none and 10 the worst he could manage.  Patient stated he could also tell he has been having more of "an episode" lately as he finds himself more short tempered and easily agitated or fretted by others.  Patient reported his thoughts are racing more lately too so it is hard for him to not get upset at times.  Patient again denied any current suicidal or homicidal ideations, plans or intent and is glad he is starting PHP to learn some new coping skills and for more daily interactions, even if through virtual  groups.  Reviewed all of patient's medications with him as he denied any major side effects except sedation with hydroxyzine.  Patient will discuss this with Ricky Ala, NP when he sees her which should be on 05/23/18.  Patient agreed to inform this nurse if he began to have any thoughts of wanting to harm self or others and started having any planning or intent with these thoughts as he reports he does get passive suicidal ideations often but has no desire to act on thoughts.  Patient states his religion and faith are a big reason for never wanting to harm himself or others and denies any history of suicidal attempts or self harm.  Patient reports he is very active in his church and with several [place] groups but these have been impacted with COVID-19 restrictions as well.  Patient does  talk on the phone with his daughters and son 3-4 times a week and still communicates with his wife he is currently separated from.  Reports they are arguing more currently but still have a relationship.  Patient agreed to let this nurse or PHP staff know if any worsening symptoms and was encouraged to share his thoughts and to try new skills he will learn in PHP.  Patient returned to group today and will see NP 05/23/18 per plan.

## 2018-05-22 NOTE — Therapy (Signed)
[facility] PARTIAL HOSPITALIZATION PROGRAM [address] [place], [place], [address] Phone: [phone]   Fax:  [phone]  Occupational Therapy Evaluation  Patient Details  Name: Mark [name] MRN: [id number] Date of Birth: 1960/09/10 Referring Provider (OT): Hillery Jacks, NP  Virtual Visit via Video Note  I connected with Mark [name] on 05/22/18 at  8:00 AM EDT by a video enabled telemedicine application and verified that I am speaking with the correct person using two identifiers.   I discussed the limitations of evaluation and management by telemedicine and the availability of in person appointments. The patient expressed understanding and agreed to proceed.  I discussed the assessment and treatment plan with the patient. The patient was provided an opportunity to ask questions and all were answered. The patient agreed with the plan and demonstrated an understanding of the instructions.   The patient was advised to call back or seek an in-person evaluation if the symptoms worsen or if the condition fails to improve as anticipated.  I provided 90 minutes of non-face-to-face time during this encounter.  Dalphine Handing, MSOT, OTR/L Behavioral Health OT/ Acute Relief OT PHP Office: [phone]  Dalphine Handing, [place]    Encounter Date: 05/22/2018  OT End of Session - 05/22/18 1509    Visit Number  1    Number of Visits  16    Date for OT Re-Evaluation  06/19/18    Authorization Type  [employer] medicare    OT Start Time  1100    OT Stop Time  1230    OT Time Calculation (min)  90 min    Activity Tolerance  Patient tolerated treatment well    Behavior During Therapy  [facility] for tasks assessed/performed       Past Medical History:  Diagnosis Date  . Above knee amputation status, right   . Anxiety   . Depression   . Diabetes mellitus, type II (HCC)   . Hyperlipemia   . Hypertension   . PTSD (post-traumatic stress disorder)   . Shoulder pain      Past Surgical History:  Procedure Laterality Date  . HERNIA REPAIR    . LEG AMPUTATION Right   . parenatal cyst      There were no vitals filed for this visit.  Subjective Assessment - 05/22/18 1458    Currently in Pain?  Yes    Pain Score  7     Pain Location  Leg    Pain Orientation  Right    Pain Descriptors / Indicators  Aching;Throbbing;Sore    Pain Type  Chronic pain    Pain Radiating Towards  pt reports pain in his residual limb    Pain Onset  More than a month ago    Pain Frequency  Constant        [facility] OT Assessment - 05/22/18 0001      Assessment   Medical Diagnosis  Major depressive disorder, recurrent severe without psychotic features; Post traumatic stress disorder; Generalized anxiety disorder    Referring Provider (OT)  Hillery Jacks, NP    Onset Date/Surgical Date  05/22/18      Precautions   Precautions  None    Precaution Comments  R AKA amputation, uses electric w/c      Restrictions   Weight Bearing Restrictions  No      Balance Screen   Has the patient fallen in the past 6 months  Yes    How many times?  1  reports losing balance and falling out of chair; no injury   Has the patient had a decrease in activity level because of a fear of falling?   No    Is the patient reluctant to leave their home because of a fear of falling?   No        OT assessment: OCAIRS Diagnosis: MDD severe without psychotic features; GAD; PTSD  Past medical history/referral information: [facility] an increase in depressive symptoms and passive suicidal thoughts. Pt has a R AKA amputation and uses an electric w/c for functional mobility.  Living situation: Pt lives alone with dog. He has aides come in M-F for 3 hours per day to help with home maintaining.  ADLs/IADLs: Pt reports a decrease in motivation and difficulty adapting with current stay at home orders due to COVID-19 pandemic. Pt shares a decrease in  hygiene and how he will not shower until he can smell his body odor. 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 He is currently in school to receive his masters in relation to mental health and ministry. He shares how there is a stigma in the church around mental health and he would like to change this in any way in his power.  Leisure: Pt expresses being very into TV Shows, and that he is running out of shows to watch which worries him about that extra downtime. He also walks his dog  Social support: Pt reports being very isolated and lonely and that his only in person human interaction is his aides currently. Pt enjoys remaining social through support groups which he has done virtually, but shares he has been very lonely.  Struggles: socialization, sleep, coping   OCAIRS Mental Health Interview Summary of Client Scores:  FACILITATES PARTICIPATION IN OCCUPATION  ALLOWS PARTICIPATION IN OCCUPATION INHIBITS PARTICIPATION IN OCCUPATION RESTRICTS PARTICIPATION IN OCCUPATION COMMENTS  ROLES             X  In school, involved in church but increased stress lately due to pandemic  HABITS             X  Issues maintaining routine  PERSONAL CAUSATION            X     VALUES            X     INTERESTS             X  Limited interests, strained due to COVID-19 pandemic  SKILLS             X  Decreased attention  SHORT TERM GOALS             X  Difficulty with follow through  LONG TERM GOALS             X    INTERPETATION OF PAST EXPERIENCES             X    PHYSICAL ENVIRONMENT             X    SOCIAL ENVIRONMENT              X   READINESS FOR CHANGE             X       Need for Occupational Therapy:  4 Shows positive occupational participation, no need for OT.   3 Need for minimal intervention/consultative participation    X 2 Need for OT  intervention indicated to restore/improve participation   1 Need for extensive OT intervention indicated to improve participation.  Referral for follow up services also recommended.    Assessment:  Patient demonstrates behavior that inhibits participation in occupation.  Patient will benefit from occupational therapy intervention in order to improve time management, financial management, stress management, job readiness skills, social skills, and health management skills in preparation to return to full time community living and to be a productive community member.    Plan:  Patient will participate in skilled occupational therapy sessions individually or in a group setting to improve coping skills, psychosocial skills, and emotional skills required to return to prior level of function.  Treatment will be 3 times per week for 4 weeks.       S: "I get irritated very easily and that effects my communication very heavily"  O: Education given on verbal communication skills this date. Fair fighting rules discussed in reference to a variety of relationships. Pt asked to apply certain rules to a current situation in life. Continued education to be given next date.  A: Pt presents with blunted affect, engaged and participatory throughout session. Pt shares that he is very easily irritated in situations which can stop communication or make it worse. He shares how him and his wife are not doing well and currently live in separate states. He shares various examples of his irritation with her. He shares that all of the tips discussed so far are relatable and great, but he feels he will have difficulty implementing them due to his short temper and frustrations. Pt shares he willing to continue learning the remainder of the skills next session and will try them when phoning his wife tonight.  P: OT group will be x3 per week while pt in PHP             OT Education - 05/22/18 1505    Education  Details  education given on communication skills    Person(s) Educated  Patient    Methods  Explanation;Handout    Comprehension  Verbalized understanding       OT Short Term Goals - 05/22/18 1512      OT SHORT TERM GOAL #1   Title  Pt will be educated on strategies to improve psychosocial skills needed to participate fully in all daily, work, and leisure activities    Time  4    Period  Weeks    Status  New    Target Date  06/19/18      OT SHORT TERM GOAL #2   Title  Pt will apply psychosocial skills and coping mechanisms to daily activities in order to function independently and reintegrate into community    Time  4    Period  Weeks    Status  New    Target Date  06/19/18      OT SHORT TERM GOAL #3   Title  Pt will recall and/or apply 1-3 sleep hygiene strategies to improve BADL functioning prior to reintegrating into community    Time  4    Period  Weeks    Status  New    Target Date  06/19/18      OT SHORT TERM GOAL #4   Title  Pt will engage in goal setting to improve functional BADL/IADL routine upon reintegrating  into community    Time  4    Period  Weeks    Status  New    Target Date  06/19/18      OT SHORT TERM GOAL #5   Title  Pt will create and/or implement functional routine to improve successful engagement in daily tasks prior to reintegrating into community    Time  4    Period  Weeks    Status  New    Target Date  06/19/18               Plan - 05/22/18 1510    OT Occupational Profile and History  Detailed Assessment- Review of Records and additional review of physical, cognitive, psychosocial history related to current functional performance    Occupational performance deficits (Please refer to evaluation for details):  ADL's;IADL's;Rest and Sleep;Work;Education;Leisure;Social Participation    Body Structure / Function / Physical Skills  ADL    Psychosocial Skills  Coping Strategies;Habits;Routines and Behaviors;Environmental   Adaptations;Interpersonal Interaction    Rehab Potential  Good    Clinical Decision Making  Several treatment options, min-mod task modification necessary    Comorbidities Affecting Occupational Performance:  May have comorbidities impacting occupational performance    Modification or Assistance to Complete Evaluation   Min-Moderate modification of tasks or assist with assess necessary to complete eval    OT Frequency  3x / week    OT Duration  4 weeks    OT Treatment/Interventions  Coping strategies training;Psychosocial skills training;Self-care/ADL training;Other (comment)   community reintegrationg   Consulted and Agree with Plan of Care  Patient       Patient will benefit from skilled therapeutic intervention in order to improve the following deficits and impairments:  Body Structure / Function / Physical Skills, Psychosocial Skills  Visit Diagnosis: MDD (major depressive disorder), recurrent severe, without psychosis (HCC)  PTSD (post-traumatic stress disorder)  Generalized anxiety disorder  Difficulty coping    Problem List Patient Active Problem List   Diagnosis Date Noted  . PTSD (post-traumatic stress disorder) 04/15/2017  . Major depressive disorder, recurrent episode, moderate (HCC) 04/30/2016  . Generalized anxiety disorder 04/30/2016   Dalphine Handing, MSOT, OTR/L Behavioral Health OT/ Acute Relief OT PHP Office: [phone]  Dalphine Handing 05/22/2018, 3:29 PM  [facility] PARTIAL HOSPITALIZATION PROGRAM [address] [place], [place], [address] Phone: [phone]   Fax:  [phone]  Name: Korde Jeppsen MRN: [id number] Date of Birth: 1960-03-20

## 2018-05-22 NOTE — Psych (Signed)
Virtual Visit via Video Note  I connected with Mark [name] on 05/22/18 at  9:00 AM EDT by a video enabled telemedicine application and verified that I am speaking with the correct person using two identifiers.   I discussed the limitations of evaluation and management by telemedicine and the availability of in person appointments. The patient expressed understanding and agreed to proceed.    I discussed the assessment and treatment plan with the patient. The patient was provided an opportunity to ask questions and all were answered. The patient agreed with the plan and demonstrated an understanding of the instructions.   The patient was advised to call back or seek an in-person evaluation if the symptoms worsen or if the condition fails to improve as anticipated.  I provided 15 minutes of non-face-to-face time during this encounter.  Patient verbally agreed to treatment plan.  Jakobi Thetford Lauro Franklin, LCMHCA, LCASA

## 2018-05-23 ENCOUNTER — Other Ambulatory Visit (HOSPITAL_COMMUNITY): Payer: No Typology Code available for payment source | Admitting: Occupational Therapy

## 2018-05-23 ENCOUNTER — Other Ambulatory Visit (HOSPITAL_COMMUNITY): Payer: No Typology Code available for payment source | Attending: Family | Admitting: Licensed Clinical Social Worker

## 2018-05-23 ENCOUNTER — Encounter (HOSPITAL_COMMUNITY): Payer: Self-pay

## 2018-05-23 DIAGNOSIS — I10 Essential (primary) hypertension: Secondary | ICD-10-CM | POA: Diagnosis not present

## 2018-05-23 DIAGNOSIS — Z888 Allergy status to other drugs, medicaments and biological substances status: Secondary | ICD-10-CM | POA: Diagnosis not present

## 2018-05-23 DIAGNOSIS — E119 Type 2 diabetes mellitus without complications: Secondary | ICD-10-CM | POA: Diagnosis not present

## 2018-05-23 DIAGNOSIS — Z886 Allergy status to analgesic agent status: Secondary | ICD-10-CM | POA: Diagnosis not present

## 2018-05-23 DIAGNOSIS — F332 Major depressive disorder, recurrent severe without psychotic features: Secondary | ICD-10-CM | POA: Insufficient documentation

## 2018-05-23 DIAGNOSIS — F4310 Post-traumatic stress disorder, unspecified: Secondary | ICD-10-CM | POA: Diagnosis not present

## 2018-05-23 DIAGNOSIS — R4589 Other symptoms and signs involving emotional state: Secondary | ICD-10-CM

## 2018-05-23 DIAGNOSIS — F411 Generalized anxiety disorder: Secondary | ICD-10-CM

## 2018-05-23 DIAGNOSIS — Z794 Long term (current) use of insulin: Secondary | ICD-10-CM | POA: Diagnosis not present

## 2018-05-23 DIAGNOSIS — Z818 Family history of other mental and behavioral disorders: Secondary | ICD-10-CM | POA: Diagnosis not present

## 2018-05-23 DIAGNOSIS — E785 Hyperlipidemia, unspecified: Secondary | ICD-10-CM | POA: Insufficient documentation

## 2018-05-23 DIAGNOSIS — Z806 Family history of leukemia: Secondary | ICD-10-CM | POA: Diagnosis not present

## 2018-05-23 DIAGNOSIS — F17210 Nicotine dependence, cigarettes, uncomplicated: Secondary | ICD-10-CM | POA: Insufficient documentation

## 2018-05-23 DIAGNOSIS — Z89611 Acquired absence of right leg above knee: Secondary | ICD-10-CM | POA: Diagnosis not present

## 2018-05-23 DIAGNOSIS — Z79899 Other long term (current) drug therapy: Secondary | ICD-10-CM | POA: Diagnosis not present

## 2018-05-23 NOTE — Psych (Signed)
Virtual Visit via Video Note  I connected with Mark [name] on 05/22/18 at  9:00 AM EDT by a video enabled telemedicine application and verified that I am speaking with the correct person using two identifiers.   I discussed the limitations of evaluation and management by telemedicine and the availability of in person appointments. The patient expressed understanding and agreed to proceed.   I discussed the assessment and treatment plan with the patient. The patient was provided an opportunity to ask questions and all were answered. The patient agreed with the plan and demonstrated an understanding of the instructions.   The patient was advised to call back or seek an in-person evaluation if the symptoms worsen or if the condition fails to improve as anticipated.  Pt was provided 240 minutes of non-face-to-face time during this encounter.   [name], LCSW    [facility] PHP THERAPIST PROGRESS NOTE  Mark [name] [id number]  Session Time: 9:00 - 10:00  Participation Level: Active  Behavioral Response: CasualAlertDepressed  Type of Therapy: Group Therapy  Treatment Goals addressed: Coping  Interventions: CBT, DBT, Supportive and Reframing  Summary: Clinician led check-in regarding current stressors and situation, and review of patient completed daily inventory. Clinician utilized active listening and empathetic response and validated patient emotions. Clinician facilitated processing group on pertinent issues  Therapist Response: Mark [name] is a 58 y.o. male who presents with depression and anxiety symptoms.  Patient arrived within time allowed and reports that he is feeling "my normal."Patient rates his mood at a3.5 on a scale of 1-10 with 10 being great. Pt reports he is depressed and it has been getting worse over the past few months. Pt shares that when stay at home orders enacted, he feels he has lost all the progress he made in therapy. Pt reports the isolation is getting  to him and "I get excited over the smallest things now." Pt states the other side is that he also gets his hopes up and becomes disappointed very easily as well. Pt shares he is emotionally burnt out and will react however he wants to, because he is tried of working so hard to manage his emotions. Pt shares that yesterday was a bad day for him as he found out his long term disability from work is being discontinued and this is a big financial impact for him. Pt reports having passive SI and crying after recieving this information. Pt states he managed SI with his faith and desire not to go to [place].  Pt reports struggle with sleep and sleeping 5-6 hours a night. Pt able to process. Patient engaged in discussion.       Session Time: 10:00 -11:00  Participation Level: Active  Behavioral Response: CasualAlertDepressed  Type of Therapy: Group Therapy, psychoeducation, psychotherapy  Treatment Goals addressed: Coping  Interventions: CBT, DBT, Solution Focused, Supportive and Reframing  Summary:  Cln led discussion on moving from "why" to "what now." Group discussed the ways in which focusing on "why?" can cause disruptions in progress and create barriers to moving forward.   Therapist Response: Patient provided active feedback and support to group members.      Session Time: 11:00 - 12:00  Participation Level:Active  Behavioral Response:CasualAlertDepressed  Type of Therapy: Group Therapy, OT  Treatment Goals addressed: Coping  Interventions:Psychosocial skills training, Supportive,   Summary:Occupational Therapy group  Therapist Response: Patient engaged in group. See OT note.       Session Time: 12:00- 1:00  Participation Level: Active  Behavioral  Response: CasualAlertDepressed  Type of Therapy: Group Therapy, Psychoeducation; Psychotherapy  Treatment Goals addressed: Coping  Interventions: CBT; Solution focused; Supportive;  Reframing  Summary: 12:00 - 12:50 Cln introduced topic of boundaries. Cln provided education on what boundaries are and how they affect our lives. Cln led discussion on how to move boundaries from a passive to an active process in our lives.  12:50 -1:00 Clinician led check-out. Clinician assessed for immediate needs, medication compliance and efficacy, and safety concerns   Therapist Response: Patient states understanding of boundaries and asks insightful questions. Pt reports this topic will be helpful to him.  At checkout, pt rates his mood at a 6 on a scale of 1-10 with 10 being great. Patient reports afternoon plans of calling his wife to try skills learned in OT group and working on switching his closets form winter to summer clothes. Patient demonstrates some progress as evidenced by participation in first group session. Pt denies SI/HI at end of group.       Suicidal/Homicidal: Nowithout intent/plan   Plan: Pt will continue in PHP while working to decrease depression and anxiety symptoms and increase ability to manage symptoms in a healthy manner.   Diagnosis: MDD (major depressive disorder), recurrent severe, without psychosis (HCC) [F33.2]    1. MDD (major depressive disorder), recurrent severe, without psychosis (HCC)   2. PTSD (post-traumatic stress disorder)   3. Generalized anxiety disorder       Donia GuilesJenny Jewelene Mairena, LCSW 05/23/2018

## 2018-05-23 NOTE — Therapy (Signed)
[facility] PARTIAL HOSPITALIZATION PROGRAM [address] [place], [place], [address] Phone: [phone]   Fax:  [phone]  Occupational Therapy Treatment  Patient Details  Name: Mark [name] MRN: [id number] Date of Birth: 1960/12/30 Referring Provider (OT): Hillery Jacks, NP  Virtual Visit via Video Note  I connected with [name] on 05/23/18 at  8:00 AM EDT by a video enabled telemedicine application and verified that I am speaking with the correct person using two identifiers.   I discussed the limitations of evaluation and management by telemedicine and the availability of in person appointments. The patient expressed understanding and agreed to proceed.  I discussed the assessment and treatment plan with the patient. The patient was provided an opportunity to ask questions and all were answered. The patient agreed with the plan and demonstrated an understanding of the instructions.   The patient was advised to call back or seek an in-person evaluation if the symptoms worsen or if the condition fails to improve as anticipated.  I provided 60 minutes of non-face-to-face time during this encounter.  Dalphine Handing, MSOT, OTR/L Behavioral Health OT/ Acute Relief OT PHP Office: [phone]   Dalphine Handing, [place]    Encounter Date: 05/23/2018  OT End of Session - 05/23/18 1235    Visit Number  2    Number of Visits  16    Date for OT Re-Evaluation  06/19/18    Authorization Type  [employer] medicare    OT Start Time  1100    OT Stop Time  1200    OT Time Calculation (min)  60 min    Activity Tolerance  Patient tolerated treatment well    Behavior During Therapy  WFL for tasks assessed/performed       Past Medical History:  Diagnosis Date  . Above knee amputation status, right   . Anxiety   . Depression   . Diabetes mellitus, type II (HCC)   . Hyperlipemia   . Hypertension   . PTSD (post-traumatic stress disorder)   . Shoulder pain      Past Surgical History:  Procedure Laterality Date  . HERNIA REPAIR    . LEG AMPUTATION Right   . parenatal cyst      There were no vitals filed for this visit.  Subjective Assessment - 05/23/18 1234    Currently in Pain?  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 Pt encouraged to share personal examples and relations to topic.  A: Pt presents with blunted affect engaged and participatory throughout group. 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 He shares how he realizes his tactics do not work and often make arguments higher stakes. He shares to start, he would like to implement taking turns for one minute to prevent interruptions.  P: OT group will be x3 per week while pt in PHP.                     OT Education - 05/23/18 1235    Education Details  cont education given on communication skills    Person(s) Educated  Patient    Methods  Explanation;Handout    Comprehension  Verbalized understanding       OT Short Term Goals -  05/23/18 1236      OT SHORT TERM GOAL #1   Title  Pt will be educated on strategies to improve psychosocial skills needed to participate fully in all daily, work, and leisure activities    Time  4    Period  Weeks    Status  On-going    Target Date  06/19/18      OT SHORT TERM GOAL #2   Title  Pt will apply psychosocial skills and coping mechanisms to daily activities in order to function independently and reintegrate into community    Time  4    Period  Weeks    Status  On-going    Target Date  06/19/18      OT SHORT TERM GOAL #3   Title  Pt will recall and/or apply 1-3 sleep hygiene strategies to improve BADL functioning prior to reintegrating into community    Time  4    Period  Weeks    Status  On-going    Target  Date  06/19/18      OT SHORT TERM GOAL #4   Title  Pt will engage in goal setting to improve functional BADL/IADL routine upon reintegrating into community    Time  4    Period  Weeks    Status  On-going    Target Date  06/19/18      OT SHORT TERM GOAL #5   Title  Pt will create and/or implement functional routine to improve successful engagement in daily tasks prior to reintegrating into community    Time  4    Period  Weeks    Status  On-going    Target Date  06/19/18               Plan - 05/23/18 1236    Occupational performance deficits (Please refer to evaluation for details):  ADL's;IADL's;Rest and Sleep;Work;Education;Leisure;Social Participation    Body Structure / Function / Physical Skills  ADL    Psychosocial Skills  Coping Strategies;Habits;Routines and Behaviors;Environmental  Adaptations;Interpersonal Interaction       Patient will benefit from skilled therapeutic intervention in order to improve the following deficits and impairments:  Body Structure / Function / Physical Skills, Psychosocial Skills  Visit Diagnosis: MDD (major depressive disorder), recurrent severe, without psychosis (HCC)  PTSD (post-traumatic stress disorder)  Generalized anxiety disorder  Difficulty coping    Problem List Patient Active Problem List   Diagnosis Date Noted  . PTSD (post-traumatic stress disorder) 04/15/2017  . Major depressive disorder, recurrent episode, moderate (HCC) 04/30/2016  . Generalized anxiety disorder 04/30/2016   Dalphine HandingKaylee Evans Levee, MSOT, OTR/L Behavioral Health OT/ Acute Relief OT PHP Office: [phone]  Dalphine HandingKaylee Quentez Lober 05/23/2018, 12:44 PM  [facility] PARTIAL HOSPITALIZATION PROGRAM [address] [place], [address] Phone: [phone]   Fax:  [phone]  Name: Eugenio Hoesric Rawson MRN: [id number] Date of Birth: [birth year]

## 2018-05-23 NOTE — Psych (Signed)
Virtual Visit via Video Note  I connected with Mark [name] on 05/23/18 at  9:00 AM EDT by a video enabled telemedicine application and verified that I am speaking with the correct person using two identifiers.   I discussed the limitations of evaluation and management by telemedicine and the availability of in person appointments. The patient expressed understanding and agreed to proceed.   I discussed the assessment and treatment plan with the patient. The patient was provided an opportunity to ask questions and all were answered. The patient agreed with the plan and demonstrated an understanding of the instructions.   The patient was advised to call back or seek an in-person evaluation if the symptoms worsen or if the condition fails to improve as anticipated.  Pt was provided 240 minutes of non-face-to-face time during this encounter.   Mark GuilesJenny Hawthorne Day, LCSW    [facility] THERAPIST PROGRESS NOTE  Mark [name] [id number]  Session Time: 9:00 - 10:00  Participation Level: Active  Behavioral Response: CasualAlertDepressed  Type of Therapy: Group Therapy  Treatment Goals addressed: Coping  Interventions: CBT, DBT, Supportive and Reframing  Summary: Clinician led check-in regarding current stressors and situation, and review of patient completed daily inventory. Clinician utilized active listening and empathetic response and validated patient emotions. Clinician facilitated processing group on pertinent issues  Therapist Response: Mark [name] is a 58 y.o. [name] who presents with depression and anxiety symptoms.  Patient arrived within time allowed and reports that he is feeling "not bad, not great."Patient rates his mood at a6.5 on a scale of 1-10 with 10 being great. Pt reports he woke up in a better mood. Pt shares that his sleep has been poor and he attributes that to sleeping in a recliner instead of the bed because it needs to be cleared off. Pt shares that he also has not  been wearing his CPAP machine because it has to stay in the bedroom. Pt's weekend goal is to be able to sleep in the bedroom again. Pt reports having high anxiety last night with no awareness as to why. Pt reports attempts at distraction, but not being able to stick with it and getting more worked up. Pt reports he smoked marijuana to calm him down and then later took a nap, took medicine, and watched tv. Pt reports struggling with cycle of frustration and turning that frustration in on himself. Pt engaged in discussion.       Session Time: 10:00 -11:00  Participation Level: Active  Behavioral Response: CasualAlertDepressed  Type of Therapy: Group Therapy, psychoeducation, psychotherapy  Treatment Goals addressed: Coping  Interventions: CBT, DBT, Solution Focused, Supportive and Reframing  Summary:  Cln led discussion on making well informed decisions. Cln discussed the importance of looking at pros and cons of each situation and making a decision with acceptance of both. Group shared ways in which this is a struggle for them and/or has presented in their life.  Therapist Response: Pt shares looking at both sides is how he wants to solve problems and knows it is best, however struggles with impulsive responses. Pt is quick to say "I don't care" and speaks as though only what he wants matters, however what he says and how he acts in group depicts burnout or being tired more so than not caring.       Session Time: 11:00 - 12:00  Participation Level:Active  Behavioral Response:CasualAlertDepressed  Type of Therapy: Group Therapy, OT  Treatment Goals addressed: Coping  Interventions:Psychosocial skills training, Supportive,  Summary:Occupational Therapy group  Therapist Response: Patient engaged in group. See OT note.       Session Time: 12:00- 1:00  Participation Level: Active  Behavioral Response: CasualAlertDepressed  Type of  Therapy: Group Therapy, Psychoeducation; Psychotherapy  Treatment Goals addressed: Coping  Interventions: CBT; Solution focused; Supportive; Reframing  Summary: 12:00 - 12:50 Clinician continued discussion on boundaries. Clinician reviewed information discussed yesterday. Group discussed characteristics of rigid, porous, and healthy boundaries. Cln provided education on the different ways boundaries can present including material, time, physical, and emotional and examples of each presentation.  12:50 -1:00 Clinician led check-out. Clinician assessed for immediate needs, medication compliance and efficacy, and safety concerns   Therapist Response: Patient states understanding of topic and reports seeing aspects of rigid and porous in his personal boundaries.  At checkout, pt rates his mood at a 6.5 on a scale of 1-10 with 10 being great. Patient reports no specific plans for his weekend except for continuing to organize and clear off his bed. Patient demonstrates some progress as evidenced by trying multiple things to manage anxiety yesterday. Pt denies SI/HI at end of group.       Suicidal/Homicidal: Nowithout intent/plan   Plan: Pt will continue in PHP while working to decrease depression and anxiety symptoms and increase ability to manage symptoms in a healthy manner.   Diagnosis: MDD (major depressive disorder), recurrent severe, without psychosis (HCC) [F33.2]    1. MDD (major depressive disorder), recurrent severe, without psychosis (HCC)   2. PTSD (post-traumatic stress disorder)   3. Generalized anxiety disorder       Mark Guiles, LCSW 05/23/2018

## 2018-05-23 NOTE — Progress Notes (Addendum)
Virtual Visit via Video Note  I connected with Mark [name] on 05/23/18 at  9:00 AM EDT by a video enabled telemedicine application and verified that I am speaking with the correct person using two identifiers.   I discussed the limitations of evaluation and management by telemedicine and the availability of in person appointments. The patient expressed understanding and agreed to proceed.    I discussed the assessment and treatment plan with the patient. The patient was provided an opportunity to ask questions and all were answered. The patient agreed with the plan and demonstrated an understanding of the instructions.   The patient was advised to call back or seek an in-person evaluation if the symptoms worsen or if the condition fails to improve as anticipated.  I provided 15   minutes of non-face-to-face time during this encounter.   [name], NP   Behavioral Health Partial Program Assessment Note  Date: 05/23/2018 Name: Mark [name] MRN: [id number]    HPI: Mark [name]  is a 58 y.o. African American male presents with chroinc depression.  Patient reports he feels his depression has been made worse related to social isolation related to the" stay at home" orders by COVID-19.  Reported history of depression and anxiety related to PTSD.   Mark [name] reports the passing of his mother in 2018 was when he first noticed his depression.  Stated marital issues became more apparent and is now recently separated.  Reports right leg amputation in addition to extensive medical history has caused feelings of self doubt and guilt.  [facility].  Patient reports he is prescribed Wellbutrin 300, Minipress 2 mg and trazodone 50.  Reported passive suicidal ideations however denies intent and plan reported "I feel like I am just existing."  Reports he is currently followed by a therapist and has recently initiated weekly  therapy sessions.  Started patient was enrolled in partial psychiatric program on 05/23/18.  Primary complaints include: depression lessened, depression worse, poor concentration and trauma recollections.  Onset of symptoms was gradual with gradually worsening course since that time. Psychosocial Stressors include the following: health and marital.   I have reviewed the following documentation dated 05/23/2018: past psychiatric history, past medical history and past Review of systems  Complaints of Pain: receiving treatmentar Past Psychiatric History:  Past psychiatric hospitalizations  and Past medication trials   Currently in treatment with Trazodone 50  mg, Wellbutrin 300 mg and Minipress 2 mg   Substance Abuse History: None - perscribed Percocet 5-325mg  PRN  Use of Alcohol: denied Use of Caffeine: denies use Use of over the counter:   Past Surgical History:  Procedure Laterality Date  . HERNIA REPAIR    . LEG AMPUTATION Right   . parenatal cyst      Past Medical History:  Diagnosis Date  . Above knee amputation status, right   . Anxiety   . Depression   . Diabetes mellitus, type II (HCC)   . Hyperlipemia   . Hypertension   . PTSD (post-traumatic stress disorder)   . Shoulder pain    Outpatient Encounter Medications as of 05/23/2018  Medication Sig Note  . atorvastatin (LIPITOR) 80 MG tablet Take 80 mg by mouth daily.   Marland Kitchen buPROPion (WELLBUTRIN XL) 300 MG 24 hr tablet Take 1 tablet (300 mg total) by mouth daily.   . cyclobenzaprine (FLEXERIL) 10 MG tablet Take 10 mg by mouth 2 (two) times daily as  needed for muscle spasms.   . hydrOXYzine (ATARAX/VISTARIL) 25 MG tablet Take 25 mg by mouth 2 (two) times daily as needed. Takes 1-2 tablets twice a day as needed for anxiety   . insulin aspart protamine- aspart (NOVOLOG MIX 70/30) (70-30) 100 UNIT/ML injection Inject into the skin. 05/22/2018: 30 units with meal  . Insulin Glargine (LANTUS [place]) Inject into the skin. 05/22/2018: 45  units twice a day.   . lamoTRIgine (LAMICTAL) 100 MG tablet Take 1 tablet (100 mg total) by mouth daily.   Marland Kitchen. lisinopril (PRINIVIL,ZESTRIL) 5 MG tablet Take 5 mg by mouth daily. 05/22/2018: Plans to restart   . metFORMIN (GLUCOPHAGE) 1000 MG tablet Take 1,000 mg by mouth 2 (two) times daily with a meal. 05/22/2018: Takes 1-2 times a day depending on glucose level per patient report  . oxyCODONE-acetaminophen (PERCOCET) 5-325 MG tablet Take by mouth every 4 (four) hours as needed for severe pain. 05/22/2018: Takes only as needed 1-2 times a day   . prazosin (MINIPRESS) 2 MG capsule Take 1 capsule (2 mg total) by mouth at bedtime.   . sildenafil (VIAGRA) 100 MG tablet Take 100 mg by mouth daily as needed for erectile dysfunction.   . traZODone (DESYREL) 50 MG tablet Take 50 mg by mouth at bedtime.    No facility-administered encounter medications on file as of 05/23/2018.    Allergies  Allergen Reactions  . Ozempic (0.25 Or 0.5 Mg-Dose) [Semaglutide(0.25 Or 0.5mg -Dos)] Other (See Comments)    Caused Pancreatitis  . Lyrica [Pregabalin] Rash    Social History   Tobacco Use  . Smoking status: Current Every Day Smoker    Packs/day: 1.50    Years: 35.00    Pack years: 52.50    Types: Cigarettes  . Smokeless tobacco: Never Used  . Tobacco comment: Had quit for 3 years then restarted and back up to 1 pk a day.  Substance Use Topics  . Alcohol use: Yes    Alcohol/week: 4.0 standard drinks    Types: 2 Cans of beer, 2 Shots of liquor per week    Comment: Reports light drinking weekly.   Functioning Relationships: strained with spouse or significant others, alone & isolated and good relationship with children Education: College       Please specify degree:  Other Pertinent History: None Family History  Problem Relation Age of Onset  . Aneurysm Mother   . Leukemia Father   . Dementia Maternal Aunt   . Seizures Paternal Uncle      Review of Systems Constitutional:  negative  Objective:  There were no vitals filed for this visit.  Physical Exam: No exam performed today, no exam necessary.  Mental Status Exam: Appearance:  Casually dressed Psychomotor::  Seen via teleassessment Attention span and concentration: Normal Behavior: calm and cooperative Speech:  normal pitch Mood:  depressed Affect:  normal and mood-congruent Thought Process:  Coherent Thought Content:  Hallucinations: None Orientation:  person, place and time/date Cognition:  grossly intact Insight:  Intact Judgment:  Intact Estimate of Intelligence: Average Fund of knowledge: Aware of current events Memory: Recent and remote intact Abnormal movements: None Gait and station: Normal  Assessment:  Diagnosis: MDD (major depressive disorder), recurrent severe, without psychosis (HCC) [F33.2] 1. MDD (major depressive disorder), recurrent severe, without psychosis (HCC)   2. PTSD (post-traumatic stress disorder)   3. Generalized anxiety disorder     Indications for admission: inpatient care required if not in partial hospital program  Plan: Orders placed for occupational  therapy patient enrolled in Partial Hospitalization Program, patient's current medications are to be continued, a comprehensive treatment plan will be developed and side effects of medications have been reviewed with patient  Treatment options and alternatives reviewed with patient and patient understands the above plan.  Treatment plan was reviewed and agreed upon by NP T. Lewis inpatient Xeng Nawabi's need for group services.     [name], NP  Attest to NP Progress Note

## 2018-05-26 ENCOUNTER — Other Ambulatory Visit (HOSPITAL_COMMUNITY): Payer: No Typology Code available for payment source | Admitting: Licensed Clinical Social Worker

## 2018-05-26 ENCOUNTER — Other Ambulatory Visit (HOSPITAL_COMMUNITY): Payer: No Typology Code available for payment source

## 2018-05-26 ENCOUNTER — Ambulatory Visit (HOSPITAL_COMMUNITY): Payer: No Typology Code available for payment source | Admitting: Licensed Clinical Social Worker

## 2018-05-26 ENCOUNTER — Encounter (HOSPITAL_COMMUNITY): Payer: Self-pay | Admitting: Licensed Clinical Social Worker

## 2018-05-26 ENCOUNTER — Other Ambulatory Visit: Payer: Self-pay

## 2018-05-26 DIAGNOSIS — E785 Hyperlipidemia, unspecified: Secondary | ICD-10-CM | POA: Diagnosis not present

## 2018-05-26 DIAGNOSIS — E119 Type 2 diabetes mellitus without complications: Secondary | ICD-10-CM | POA: Diagnosis not present

## 2018-05-26 DIAGNOSIS — F332 Major depressive disorder, recurrent severe without psychotic features: Secondary | ICD-10-CM

## 2018-05-26 DIAGNOSIS — F4310 Post-traumatic stress disorder, unspecified: Secondary | ICD-10-CM | POA: Diagnosis not present

## 2018-05-26 DIAGNOSIS — Z79899 Other long term (current) drug therapy: Secondary | ICD-10-CM | POA: Diagnosis not present

## 2018-05-26 DIAGNOSIS — Z794 Long term (current) use of insulin: Secondary | ICD-10-CM | POA: Diagnosis not present

## 2018-05-26 DIAGNOSIS — I10 Essential (primary) hypertension: Secondary | ICD-10-CM | POA: Diagnosis not present

## 2018-05-26 DIAGNOSIS — Z89611 Acquired absence of right leg above knee: Secondary | ICD-10-CM | POA: Diagnosis not present

## 2018-05-26 DIAGNOSIS — F411 Generalized anxiety disorder: Secondary | ICD-10-CM

## 2018-05-26 NOTE — Progress Notes (Signed)
Virtual Visit via Video Note  I connected with Mark [name] on 05/26/18 at  5:00 PM EDT by a video enabled telemedicine application and verified that I am speaking with the correct person using two identifiers.   I discussed the limitations of evaluation and management by telemedicine and the availability of in person appointments. The patient expressed understanding and agreed to proceed.  History of Present Illness: Pt was referred by [facility] community care for depression and ptsd.     Observations/Objective: Pt presents  via webex depressed during the counseling session.    Summary of Progress:  Patient participated in a discussion on communication, different styles, fair fighting rules, asking for needs to be met, escalating confrontation, protecting self and others. Patient was encouraged to use effective communication, use fair fighting rules, and confrontation without being confrontational.      Follow Up Instructions:    I discussed the assessment and treatment plan with the patient. The patient was provided an opportunity to ask questions and all were answered. The patient agreed with the plan and demonstrated an understanding of the instructions.   The patient was advised to call back or seek an in-person evaluation if the symptoms worsen or if the condition fails to improve as anticipated.  I provided 60 minutes of non-face-to-face time during this encounter.   MACKENZIE,LISBETH S, LCAS

## 2018-05-27 ENCOUNTER — Other Ambulatory Visit (HOSPITAL_COMMUNITY): Payer: No Typology Code available for payment source | Admitting: Licensed Clinical Social Worker

## 2018-05-27 ENCOUNTER — Other Ambulatory Visit: Payer: Self-pay

## 2018-05-27 ENCOUNTER — Encounter (HOSPITAL_COMMUNITY): Payer: Self-pay

## 2018-05-27 ENCOUNTER — Other Ambulatory Visit (HOSPITAL_COMMUNITY): Payer: No Typology Code available for payment source | Admitting: Occupational Therapy

## 2018-05-27 DIAGNOSIS — Z794 Long term (current) use of insulin: Secondary | ICD-10-CM | POA: Diagnosis not present

## 2018-05-27 DIAGNOSIS — E119 Type 2 diabetes mellitus without complications: Secondary | ICD-10-CM | POA: Diagnosis not present

## 2018-05-27 DIAGNOSIS — F332 Major depressive disorder, recurrent severe without psychotic features: Secondary | ICD-10-CM

## 2018-05-27 DIAGNOSIS — F4310 Post-traumatic stress disorder, unspecified: Secondary | ICD-10-CM

## 2018-05-27 DIAGNOSIS — R4589 Other symptoms and signs involving emotional state: Secondary | ICD-10-CM

## 2018-05-27 DIAGNOSIS — Z89611 Acquired absence of right leg above knee: Secondary | ICD-10-CM | POA: Diagnosis not present

## 2018-05-27 DIAGNOSIS — E785 Hyperlipidemia, unspecified: Secondary | ICD-10-CM | POA: Diagnosis not present

## 2018-05-27 DIAGNOSIS — F411 Generalized anxiety disorder: Secondary | ICD-10-CM | POA: Diagnosis not present

## 2018-05-27 DIAGNOSIS — Z79899 Other long term (current) drug therapy: Secondary | ICD-10-CM | POA: Diagnosis not present

## 2018-05-27 DIAGNOSIS — I10 Essential (primary) hypertension: Secondary | ICD-10-CM | POA: Diagnosis not present

## 2018-05-27 NOTE — Addendum Note (Signed)
Addended by: Vernona Rieger on: 05/27/2018 10:55 AM   Modules accepted: Level of Service

## 2018-05-27 NOTE — Therapy (Signed)
[facility] [address] [place], [place], [address] Phone: [phone]   Fax:  [phone]  Occupational Therapy Treatment  Patient Details  Name: Mark [name] MRN: [id number] Date of Birth: 04-12-1960 Referring Provider (OT): Hillery Jacksanika Lewis, NP  Virtual Visit via Video Note  I connected with Mark [name] on 05/27/18 at  8:00 AM EDT by a video enabled telemedicine application and verified that I am speaking with the correct person using two identifiers.   I discussed the limitations of evaluation and management by telemedicine and the availability of in person appointments. The patient expressed understanding and agreed to proceed.   I discussed the assessment and treatment plan with the patient. The patient was provided an opportunity to ask questions and all were answered. The patient agreed with the plan and demonstrated an understanding of the instructions.   The patient was advised to call back or seek an in-person evaluation if the symptoms worsen or if the condition fails to improve as anticipated.  I provided 60 minutes of non-face-to-face time during this encounter.  Dalphine HandingKaylee Gregorio Worley, MSOT, OTR/L Behavioral Health OT/ Acute Relief OT PHP Office: [phone]  Dalphine HandingKaylee Elayjah Chaney, [place]    Encounter Date: 05/27/2018  OT End of Session - 05/27/18 1503    Visit Number  3    Number of Visits  16    Date for OT Re-Evaluation  06/19/18    Authorization Type  [employer] medicare    OT Start Time  1100    OT Stop Time  1200    OT Time Calculation (min)  60 min    Activity Tolerance  Patient tolerated treatment well    Behavior During Therapy  WFL for tasks assessed/performed       Past Medical History:  Diagnosis Date  . Above knee amputation status, right   . Anxiety   . Depression   . Diabetes mellitus, type II (HCC)   . Hyperlipemia   . Hypertension   . PTSD (post-traumatic stress disorder)   . Shoulder pain      Past Surgical History:  Procedure Laterality Date  . HERNIA REPAIR    . LEG AMPUTATION Right   . parenatal cyst      There were no vitals filed for this visit.  Subjective Assessment - 05/27/18 1503    Currently in Pain?  Other (Comment)   chronic pain       S: "I have these communication issues in many areas"  O: OT treatment focus on communication skills, with emphasis of session on active listening. Pt received handout to guide understanding of definition of active listening and how to improve skills this date. Pt asked to share personal experiences and examples throughout session. Continued education to be completed next OT Treatment date.  A: Pt presented to group with blunted affect, engaged and participatory throughout session. Pt received active listening handout, in understanding of ways to improve current practices. Pt shares that he rarely uses the skills learned and that this could be a big area of improvement for himself. He shares how he can use these skills when communicating to his wife. Pt eager for continued education next date.   P: Pt provided with communication skills to implement when reintegrating into community dwelling. OT will continue follow up with active listening skills for completion next treatment date, for a total of x3 per week while pt in PHP.  OT Education - 05/27/18 1503    Education Details  cont education given on communication skills    Person(s) Educated  Patient    Methods  Explanation;Handout    Comprehension  Verbalized understanding       OT Short Term Goals - 05/23/18 1236      OT SHORT TERM GOAL #1   Title  Pt will be educated on strategies to improve psychosocial skills needed to participate fully in all daily, work, and leisure activities    Time  4    Period  Weeks    Status  On-going    Target Date  06/19/18      OT SHORT TERM GOAL #2   Title  Pt will apply psychosocial skills and  coping mechanisms to daily activities in order to function independently and reintegrate into community    Time  4    Period  Weeks    Status  On-going    Target Date  06/19/18      OT SHORT TERM GOAL #3   Title  Pt will recall and/or apply 1-3 sleep hygiene strategies to improve BADL functioning prior to reintegrating into community    Time  4    Period  Weeks    Status  On-going    Target Date  06/19/18      OT SHORT TERM GOAL #4   Title  Pt will engage in goal setting to improve functional BADL/IADL routine upon reintegrating into community    Time  4    Period  Weeks    Status  On-going    Target Date  06/19/18      OT SHORT TERM GOAL #5   Title  Pt will create and/or implement functional routine to improve successful engagement in daily tasks prior to reintegrating into community    Time  4    Period  Weeks    Status  On-going    Target Date  06/19/18               Plan - 05/27/18 1504    Occupational performance deficits (Please refer to evaluation for details):  ADL's;IADL's;Rest and Sleep;Work;Education;Leisure;Social Participation    Body Structure / Function / Physical Skills  ADL    Psychosocial Skills  Coping Strategies;Habits;Routines and Behaviors;Environmental  Adaptations;Interpersonal Interaction       Patient will benefit from skilled therapeutic intervention in order to improve the following deficits and impairments:  Body Structure / Function / Physical Skills, Psychosocial Skills  Visit Diagnosis: MDD (major depressive disorder), recurrent severe, without psychosis (HCC)  PTSD (post-traumatic stress disorder)  Generalized anxiety disorder  Difficulty coping    Problem List Patient Active Problem List   Diagnosis Date Noted  . PTSD (post-traumatic stress disorder) 04/15/2017  . Major depressive disorder, recurrent episode, moderate (HCC) 04/30/2016  . Generalized anxiety disorder 04/30/2016   Dalphine Handing, MSOT, OTR/L Behavioral  Health OT/ Acute Relief OT PHP Office: [phone]  Dalphine Handing 05/27/2018, 3:04 PM  [facility] PARTIAL HOSPITALIZATION PROGRAM [address] [place], [place], [address] Phone: [phone]   Fax:  [phone]  Name: Mark [name] MRN: [id number] Date of Birth: 04-19-1960

## 2018-05-28 ENCOUNTER — Other Ambulatory Visit (HOSPITAL_COMMUNITY): Payer: No Typology Code available for payment source | Admitting: Licensed Clinical Social Worker

## 2018-05-28 ENCOUNTER — Other Ambulatory Visit: Payer: Self-pay

## 2018-05-28 ENCOUNTER — Ambulatory Visit (HOSPITAL_COMMUNITY): Payer: Self-pay

## 2018-05-28 DIAGNOSIS — F332 Major depressive disorder, recurrent severe without psychotic features: Secondary | ICD-10-CM | POA: Diagnosis not present

## 2018-05-28 DIAGNOSIS — Z89611 Acquired absence of right leg above knee: Secondary | ICD-10-CM | POA: Diagnosis not present

## 2018-05-28 DIAGNOSIS — F411 Generalized anxiety disorder: Secondary | ICD-10-CM

## 2018-05-28 DIAGNOSIS — E119 Type 2 diabetes mellitus without complications: Secondary | ICD-10-CM | POA: Diagnosis not present

## 2018-05-28 DIAGNOSIS — E785 Hyperlipidemia, unspecified: Secondary | ICD-10-CM | POA: Diagnosis not present

## 2018-05-28 DIAGNOSIS — Z794 Long term (current) use of insulin: Secondary | ICD-10-CM | POA: Diagnosis not present

## 2018-05-28 DIAGNOSIS — F4310 Post-traumatic stress disorder, unspecified: Secondary | ICD-10-CM

## 2018-05-28 DIAGNOSIS — Z79899 Other long term (current) drug therapy: Secondary | ICD-10-CM | POA: Diagnosis not present

## 2018-05-28 DIAGNOSIS — I10 Essential (primary) hypertension: Secondary | ICD-10-CM | POA: Diagnosis not present

## 2018-05-28 NOTE — Progress Notes (Addendum)
GROUP NOTE - spiritual care group 05/28/2018 11:00 - 12:00 ?Facilitated by Wilkie Aye, MDiv, BCC. Group was held via [employer] in compliance with COVID-19 precautions ? ? Group focused on topic of strength. ?Group members reflected on what thoughts and feelings emerge when they hear this topic. ?They then engaged in facilitated dialog around how strength is present in their lives. This dialog focused on representing what strength had been to them in their lives (images and patterns given) and what they saw as helpful in their life now (what they needed / wanted). ? ? Activity drew on narrative framework   Mark [name] was present throughout group.  Engaged in activity and facilitated discussion.  Mark [name] defined strength as "reverence, resilience, respect and responsibility."  Engaged in discussion with group members around resilience vs endurance.  Mark [name] wished to seek resilience - which he defined as knowing and managing his resources vs endurance - which he defined as just pushing through anything without a plan.  He also identified the concept of "stress and distress" as important for him, relating that some stress was acceptable and helpful if it was connected to his goals or values, but distress that is not connected to a positive outcome for him should be avoided.  He stated that care for his family is a value he is carrying forward.

## 2018-05-29 ENCOUNTER — Ambulatory Visit (HOSPITAL_COMMUNITY): Payer: Medicare HMO | Admitting: Licensed Clinical Social Worker

## 2018-05-29 ENCOUNTER — Other Ambulatory Visit (HOSPITAL_COMMUNITY): Payer: No Typology Code available for payment source | Admitting: Licensed Clinical Social Worker

## 2018-05-29 ENCOUNTER — Other Ambulatory Visit: Payer: Self-pay

## 2018-05-29 ENCOUNTER — Other Ambulatory Visit (HOSPITAL_COMMUNITY): Payer: No Typology Code available for payment source | Admitting: Occupational Therapy

## 2018-05-29 ENCOUNTER — Encounter (HOSPITAL_COMMUNITY): Payer: Self-pay | Admitting: Occupational Therapy

## 2018-05-29 DIAGNOSIS — F411 Generalized anxiety disorder: Secondary | ICD-10-CM | POA: Diagnosis not present

## 2018-05-29 DIAGNOSIS — F4310 Post-traumatic stress disorder, unspecified: Secondary | ICD-10-CM

## 2018-05-29 DIAGNOSIS — R4589 Other symptoms and signs involving emotional state: Secondary | ICD-10-CM

## 2018-05-29 DIAGNOSIS — F332 Major depressive disorder, recurrent severe without psychotic features: Secondary | ICD-10-CM

## 2018-05-29 DIAGNOSIS — Z794 Long term (current) use of insulin: Secondary | ICD-10-CM | POA: Diagnosis not present

## 2018-05-29 DIAGNOSIS — I10 Essential (primary) hypertension: Secondary | ICD-10-CM | POA: Diagnosis not present

## 2018-05-29 DIAGNOSIS — Z79899 Other long term (current) drug therapy: Secondary | ICD-10-CM | POA: Diagnosis not present

## 2018-05-29 DIAGNOSIS — Z89611 Acquired absence of right leg above knee: Secondary | ICD-10-CM | POA: Diagnosis not present

## 2018-05-29 DIAGNOSIS — E785 Hyperlipidemia, unspecified: Secondary | ICD-10-CM | POA: Diagnosis not present

## 2018-05-29 DIAGNOSIS — E119 Type 2 diabetes mellitus without complications: Secondary | ICD-10-CM | POA: Diagnosis not present

## 2018-05-29 NOTE — Progress Notes (Signed)
Met with patient through virtual Webex meeting with Ricky Ala, NP as he presented with restricted affect, depressed mood and stated "it ain't good today".  Patient reported he was just having a tougher day today after not taking medications to help him sleep the previous night.  States he felt hung over and tired the previous day, to the point he fell asleep during relaxation exercises with PHP group, so he did not take medications the previous night as he did not want to be too tired today.  Patient discussed cutting Trazodone medication in 1/2, down to 25 mg of a 50 mg tablet with the options Ricky Ala, NP instructed patient if he then had problems still going to sleep, after and hour he could take the other 1/2.  Patient stated agreeing with this plan and that he currently has enough medications to cut them in 1/2 to try.  Patient denied any current suicidal or homicidal ideations, no auditory or visual hallucinations and no plan of intent to want to harm self or others.  Patient stated he still has fleeting suicidal thoughts at times but that he does not want to act on these and his religion keeps him from ever wanting to act on such passing thoughts.  Patient rated his current level of depression a 3-4 and anxiety a 5-6 on a scale of 0-10 with 0 being none and 10 the worst he could manage.  Patient admitted staying at home all the time alone has caused a lot of his current problems with depression and anxiety and only going out about once ever 7-10 days to the store or somewhere in his car.  Patient still spends most of his time alone with his dog and does still have an aid that visits to help a few hours a day several days a week.  Patient will try decrease in Trazodone to see if can still go to sleep and sleep okay with a 1/2 dosage of the 50 mg tablet but that also will not have any adverse increases in depression with reduction of the medication.  Patient to contact this nurse, [place] staff or NP if any  problems with medications or worsening of symptoms. Patient reported doing his homework and trying skills he is learning in [place].

## 2018-05-29 NOTE — Progress Notes (Signed)
Virtual Visit via Video Note  I connected with Mark [name] on 05/29/18 at  9:00 AM EDT by a video enabled telemedicine application and verified that I am speaking with the correct person using two identifiers.   I discussed the limitations of evaluation and management by telemedicine and the availability of in person appointments. The patient expressed understanding and agreed to proceed.  I discussed the assessment and treatment plan with the patient. The patient was provided an opportunity to ask questions and all were answered. The patient agreed with the plan and demonstrated an understanding of the instructions.   The patient was advised to call back or seek an in-person evaluation if the symptoms worsen or if the condition fails to improve as anticipated.  I provided of non-face-to-face time during this encounter.   Mark Rack, NP   [facility] MD/PA/NP OP Progress Note  05/30/2018 9:07 AM Mark [name]  MRN:  [id number]   Evaluation: Mark [name] was seen via WebEx.  He was evaluated by NP and RN.  Patient presented flat, depressed but pleasant.  Mark [name] stated " I am not having a good day today."  Reported restless nights as he did not take any nighttime medications.  Patient reports concerns with oversedation with medication.  Discussed titrating trazodone 50 mg to 25 mg.  Patient may repeat 25 if unable to rest within 1 hour.  Patient was receptive to the plan.  He reports cyclic worsening depression.  Stated sometimes I just have to go through the motions and "sit in these feelings" stated he has no real outlet to discuss his feelings.  Reports a good relationship with his children however most of the conversations are superficial as he reports he is unable to discuss his true feelings with his children.  Mark [name] states his daughter suffers from depression, and does not want to burden her with his problems and feelings.  Reports chronic passive suicidal ideations.  Denies intent or plan.  I  am just existing.  Mark [name] reports"  I would never hurt myself because I believe in God."  Reports mild paranoia related to COVID and misinformation as it relates to the public.  Reports a fair appetite.  Reports taking medication as directed and tolerating them well.  Support, encouragement reassurance was provided.   Visit Diagnosis:    ICD-10-CM   1. MDD (major depressive disorder), recurrent severe, without psychosis (HCC) F33.2   2. Generalized anxiety disorder F41.1   3. PTSD (post-traumatic stress disorder) F43.10     Past Psychiatric History:   Past Medical History:  Past Medical History:  Diagnosis Date  . Above knee amputation status, right   . Anxiety   . Depression   . Diabetes mellitus, type II (HCC)   . Hyperlipemia   . Hypertension   . PTSD (post-traumatic stress disorder)   . Shoulder pain     Past Surgical History:  Procedure Laterality Date  . HERNIA REPAIR    . LEG AMPUTATION Right   . parenatal cyst      Family Psychiatric History:   Family History:  Family History  Problem Relation Age of Onset  . Aneurysm Mother   . Leukemia Father   . Dementia Maternal Aunt   . Seizures Paternal Uncle     Social History:  Social History   Socioeconomic History  . Marital status: Married    Spouse name: Not on file  . Number of children: Not on file  . Years of education: Not on  file  . Highest education level: Not on file  Occupational History  . Not on file  Social Needs  . Financial resource strain: Not very hard  . Food insecurity:    Worry: Never true    Inability: Never true  . Transportation needs:    Medical: No    Non-medical: No  Tobacco Use  . Smoking status: Current Every Day Smoker    Packs/day: 1.50    Years: 35.00    Pack years: 52.50    Types: Cigarettes  . Smokeless tobacco: Never Used  . Tobacco comment: Had quit for 3 years then restarted and back up to 1 pk a day.  Substance and Sexual Activity  . Alcohol use: Yes     Alcohol/week: 4.0 standard drinks    Types: 2 Cans of beer, 2 Shots of liquor per week    Comment: Reports light drinking weekly.  . Drug use: Yes    Frequency: 14.0 times per week    Types: Marijuana    Comment: Uses 1-2 times a day per report for his anxiety  . Sexual activity: Not Currently  Lifestyle  . Physical activity:    Days per week: 2 days    Minutes per session: 20 min  . Stress: Very much  Relationships  . Social connections:    Talks on phone: More than three times a week    Gets together: Never    Attends religious service: More than 4 times per year    Active member of club or organization: Yes    Attends meetings of clubs or organizations: More than 4 times per year    Relationship status: Separated  Other Topics Concern  . Not on file  Social History Narrative  . Not on file    Allergies:  Allergies  Allergen Reactions  . Ozempic (0.25 Or 0.5 Mg-Dose) [Semaglutide(0.25 Or 0.5mg -Dos)] Other (See Comments)    Caused Pancreatitis  . Lyrica [Pregabalin] Rash    Metabolic Disorder Labs: No results found for: HGBA1C, MPG No results found for: PROLACTIN No results found for: CHOL, TRIG, HDL, CHOLHDL, VLDL, LDLCALC No results found for: TSH  Therapeutic Level Labs: No results found for: LITHIUM No results found for: VALPROATE No components found for:  CBMZ  Current Medications: Current Outpatient Medications  Medication Sig Dispense Refill  . atorvastatin (LIPITOR) 80 MG tablet Take 80 mg by mouth daily.    Marland Kitchen buPROPion (WELLBUTRIN XL) 300 MG 24 hr tablet Take 1 tablet (300 mg total) by mouth daily. 90 tablet 0  . cyclobenzaprine (FLEXERIL) 10 MG tablet Take 10 mg by mouth 2 (two) times daily as needed for muscle spasms.    . hydrOXYzine (ATARAX/VISTARIL) 25 MG tablet Take 25 mg by mouth 2 (two) times daily as needed. Takes 1-2 tablets twice a day as needed for anxiety    . insulin aspart protamine- aspart (NOVOLOG MIX 70/30) (70-30) 100 UNIT/ML injection  Inject into the skin.    . Insulin Glargine (LANTUS [place]) Inject into the skin.    Marland Kitchen lamoTRIgine (LAMICTAL) 100 MG tablet Take 1 tablet (100 mg total) by mouth daily. 90 tablet 0  . lisinopril (PRINIVIL,ZESTRIL) 5 MG tablet Take 5 mg by mouth daily.    . metFORMIN (GLUCOPHAGE) 1000 MG tablet Take 1,000 mg by mouth 2 (two) times daily with a meal.    . oxyCODONE-acetaminophen (PERCOCET) 5-325 MG tablet Take by mouth every 4 (four) hours as needed for severe pain.    Marland Kitchen  prazosin (MINIPRESS) 2 MG capsule Take 1 capsule (2 mg total) by mouth at bedtime. 90 capsule 0  . sildenafil (VIAGRA) 100 MG tablet Take 100 mg by mouth daily as needed for erectile dysfunction.    . traZODone (DESYREL) 50 MG tablet Take 50 mg by mouth at bedtime.     No current facility-administered medications for this visit.      Musculoskeletal: Strength & Muscle Tone: N/A Gait & Station: N/A reported use of a wheelchair  Patient leans: N/A  Psychiatric Specialty Exam: ROS  There were no vitals taken for this visit.There is no height or weight on file to calculate BMI.  General Appearance: Casual  Eye Contact:  Good  Speech:  Clear and Coherent  Volume:  Normal  Mood:  Anxious, Depressed and Irritable  Affect:  Congruent and Depressed  Thought Process:  Coherent  Orientation:  Full (Time, Place, and Person)  Thought Content: Logical   Suicidal Thoughts:  Yes.  without intent/plan chronic passive suicidal ideaiotns  Homicidal Thoughts:  No  Memory:  Immediate;   Fair Recent;   Fair  Judgement:  Fair  Insight:  Good  Psychomotor Activity:  NA  Concentration:  Concentration: Poor  Recall:  Good  Fund of Knowledge: Fair  Language: Good  Akathisia:  No  Handed:  Right  AIMS (if indicated):  Assets:  Communication Skills Desire for Improvement Resilience Social Support  ADL's:  Intact  Cognition: WNL  Sleep:  Fair   Screenings: GAD-7     Counselor from 06/03/2017 in BEHAVIORAL HEALTH OUTPATIENT THERAPY  [place]  Total GAD-7 Score  12    PHQ2-9     Counselor from 05/22/2018 in BEHAVIORAL HEALTH PARTIAL HOSPITALIZATION PROGRAM Counselor from 11/27/2017 in BEHAVIORAL HEALTH OUTPATIENT THERAPY [place] Counselor from 06/03/2017 in BEHAVIORAL HEALTH OUTPATIENT THERAPY [place]  PHQ-2 Total Score  6  6  6   PHQ-9 Total Score  24  21  19        Assessment and Plan:  Continue partial hospitalization programming (PHP) - Decreased Trazodone 50 mg to 25 mg po QHS x 1 repeat 1 hour later    Treatment plan was reviewed and agreed upon by NP T. Melvyn NethLewis and patient Mark Barthrica Mccaslin need for continued group services.    Mark Rackanika N Lewis, NP 05/30/2018, 9:07 AM

## 2018-05-29 NOTE — Therapy (Signed)
[facility] PARTIAL HOSPITALIZATION PROGRAM [address] [place], [place], [address] Phone: [phone]   Fax:  [phone]  Occupational Therapy Treatment  Patient Details  Name: Mark [name] MRN: [id number] Date of Birth: [birth year] Referring Provider (OT): Hillery Jacks, NP  Virtual Visit via Video Note  I connected with Mark [name] on 05/29/18 at  8:00 AM EDT by a video enabled telemedicine application and verified that I am speaking with the correct person using two identifiers.   I discussed the limitations of evaluation and management by telemedicine and the availability of in person appointments. The patient expressed understanding and agreed to proceed.  I discussed the assessment and treatment plan with the patient. The patient was provided an opportunity to ask questions and all were answered. The patient agreed with the plan and demonstrated an understanding of the instructions.   The patient was advised to call back or seek an in-person evaluation if the symptoms worsen or if the condition fails to improve as anticipated.  I provided 60 minutes of non-face-to-face time during this encounter.  Dalphine Handing, MSOT, OTR/L Behavioral Health OT/ Acute Relief OT PHP Office: [phone]  Dalphine Handing, [place]    Encounter Date: 05/29/2018  OT End of Session - 05/29/18 1434    Visit Number  4    Number of Visits  16    Date for OT Re-Evaluation  06/19/18    Authorization Type  [employer] medicare    OT Start Time  1100    OT Stop Time  1200    OT Time Calculation (min)  60 min    Activity Tolerance  Patient tolerated treatment well    Behavior During Therapy  WFL for tasks assessed/performed       Past Medical History:  Diagnosis Date  . Above knee amputation status, right   . Anxiety   . Depression   . Diabetes mellitus, type II (HCC)   . Hyperlipemia   . Hypertension   . PTSD (post-traumatic stress disorder)   . Shoulder pain      Past Surgical History:  Procedure Laterality Date  . HERNIA REPAIR    . LEG AMPUTATION Right   . parenatal cyst      There were no vitals filed for this visit.  Subjective Assessment - 05/29/18 1428    Currently in Pain?  Other (Comment)   chronic pain      S: "Some of these will be difficult for me to follow because I get agitated so easily"  O: Continued education given on active listening skills in communication from previous date. Communication skills addressed in reference to social participation for community integration. Topic applied to various life scenarios for pt to conceptualize. Pt encouraged to share personal examples and feedback.  A: Pt presents with blunted affect, engaged and participatory throughout session. Pt is mildly resistant, and often times thinks certain strategies will not work before trying them. Pt is eager to try active listening skills that "will work for him" and states he can understand and respect the others, but does not believe he will try them at this time. Pt gives many examples showing understanding in relation to his wife.  P: OT group will be x3 per week while pt in PHP.                    OT Education - 05/29/18 1433    Education Details  cont education given on communication skills  Person(s) Educated  Patient    Methods  Explanation;Handout    Comprehension  Verbalized understanding       OT Short Term Goals - 05/23/18 1236      OT SHORT TERM GOAL #1   Title  Pt will be educated on strategies to improve psychosocial skills needed to participate fully in all daily, work, and leisure activities    Time  4    Period  Weeks    Status  On-going    Target Date  06/19/18      OT SHORT TERM GOAL #2   Title  Pt will apply psychosocial skills and coping mechanisms to daily activities in order to function independently and reintegrate into community    Time  4    Period  Weeks    Status  On-going    Target Date   06/19/18      OT SHORT TERM GOAL #3   Title  Pt will recall and/or apply 1-3 sleep hygiene strategies to improve BADL functioning prior to reintegrating into community    Time  4    Period  Weeks    Status  On-going    Target Date  06/19/18      OT SHORT TERM GOAL #4   Title  Pt will engage in goal setting to improve functional BADL/IADL routine upon reintegrating into community    Time  4    Period  Weeks    Status  On-going    Target Date  06/19/18      OT SHORT TERM GOAL #5   Title  Pt will create and/or implement functional routine to improve successful engagement in daily tasks prior to reintegrating into community    Time  4    Period  Weeks    Status  On-going    Target Date  06/19/18               Plan - 05/29/18 1434    Occupational performance deficits (Please refer to evaluation for details):  ADL's;IADL's;Rest and Sleep;Work;Education;Leisure;Social Participation    Body Structure / Function / Physical Skills  ADL    Psychosocial Skills  Coping Strategies;Habits;Routines and Behaviors;Environmental  Adaptations;Interpersonal Interaction       Patient will benefit from skilled therapeutic intervention in order to improve the following deficits and impairments:  Body Structure / Function / Physical Skills, Psychosocial Skills  Visit Diagnosis: MDD (major depressive disorder), recurrent severe, without psychosis (HCC)  PTSD (post-traumatic stress disorder)  Generalized anxiety disorder  Difficulty coping    Problem List Patient Active Problem List   Diagnosis Date Noted  . PTSD (post-traumatic stress disorder) 04/15/2017  . Major depressive disorder, recurrent episode, moderate (HCC) 04/30/2016  . Generalized anxiety disorder 04/30/2016   Dalphine HandingKaylee Darenda Fike, MSOT, OTR/L Behavioral Health OT/ Acute Relief OT PHP Office: [phone]  Dalphine HandingKaylee Duwayne Matters 05/29/2018, 2:36 PM  [facility] PARTIAL HOSPITAL[facility]ATION PROGRAM [address]  [address] [place], [place], [address] Phone: [phone]   Fax:  [phone]  Name: Mark Hoesric Dobie MRN: [id number] Date of Birth: [birth year]

## 2018-05-30 ENCOUNTER — Other Ambulatory Visit (HOSPITAL_COMMUNITY): Payer: No Typology Code available for payment source

## 2018-05-30 ENCOUNTER — Other Ambulatory Visit: Payer: Self-pay

## 2018-05-30 ENCOUNTER — Encounter (HOSPITAL_COMMUNITY): Payer: Self-pay | Admitting: Family

## 2018-05-30 ENCOUNTER — Other Ambulatory Visit (HOSPITAL_COMMUNITY): Payer: No Typology Code available for payment source | Admitting: Occupational Therapy

## 2018-05-30 ENCOUNTER — Other Ambulatory Visit (HOSPITAL_COMMUNITY): Payer: No Typology Code available for payment source | Admitting: Licensed Clinical Social Worker

## 2018-05-30 DIAGNOSIS — F332 Major depressive disorder, recurrent severe without psychotic features: Secondary | ICD-10-CM

## 2018-05-30 DIAGNOSIS — I10 Essential (primary) hypertension: Secondary | ICD-10-CM | POA: Diagnosis not present

## 2018-05-30 DIAGNOSIS — Z89611 Acquired absence of right leg above knee: Secondary | ICD-10-CM | POA: Diagnosis not present

## 2018-05-30 DIAGNOSIS — F411 Generalized anxiety disorder: Secondary | ICD-10-CM | POA: Diagnosis not present

## 2018-05-30 DIAGNOSIS — F4310 Post-traumatic stress disorder, unspecified: Secondary | ICD-10-CM | POA: Diagnosis not present

## 2018-05-30 DIAGNOSIS — Z794 Long term (current) use of insulin: Secondary | ICD-10-CM | POA: Diagnosis not present

## 2018-05-30 DIAGNOSIS — E785 Hyperlipidemia, unspecified: Secondary | ICD-10-CM | POA: Diagnosis not present

## 2018-05-30 DIAGNOSIS — E119 Type 2 diabetes mellitus without complications: Secondary | ICD-10-CM | POA: Diagnosis not present

## 2018-05-30 DIAGNOSIS — R4589 Other symptoms and signs involving emotional state: Secondary | ICD-10-CM

## 2018-05-30 DIAGNOSIS — Z79899 Other long term (current) drug therapy: Secondary | ICD-10-CM | POA: Diagnosis not present

## 2018-05-30 NOTE — Therapy (Signed)
[facility] PARTIAL HOSPITALIZATION PROGRAM [address] [place], [place], [address] Phone: [phone]   Fax:  [phone]  Occupational Therapy Treatment  Patient Details  Name: Mark [name] MRN: [id number] Date of Birth: May 20, 1960 Referring Provider (OT): Hillery Jacks, NP   Encounter Date: 05/30/2018    Past Medical History:  Diagnosis Date  . Above knee amputation status, right   . Anxiety   . Depression   . Diabetes mellitus, type II (HCC)   . Hyperlipemia   . Hypertension   . PTSD (post-traumatic stress disorder)   . Shoulder pain     Past Surgical History:  Procedure Laterality Date  . HERNIA REPAIR    . LEG AMPUTATION Right   . parenatal cyst      There were no vitals filed for this visit.     Pt was not present for >5 minutes of group due [facility] and did not return to OT group.                     OT Short Term Goals - 05/23/18 1236      OT SHORT TERM GOAL #1   Title  Pt will be educated on strategies to improve psychosocial skills needed to participate fully in all daily, work, and leisure activities    Time  4    Period  Weeks    Status  On-going    Target Date  06/19/18      OT SHORT TERM GOAL #2   Title  Pt will apply psychosocial skills and coping mechanisms to daily activities in order to function independently and reintegrate into community    Time  4    Period  Weeks    Status  On-going    Target Date  06/19/18      OT SHORT TERM GOAL #3   Title  Pt will recall and/or apply 1-3 sleep hygiene strategies to improve BADL functioning prior to reintegrating into community    Time  4    Period  Weeks    Status  On-going    Target Date  06/19/18      OT SHORT TERM GOAL #4   Title  Pt will engage in goal setting to improve functional BADL/IADL routine upon reintegrating into community    Time  4    Period  Weeks    Status  On-going    Target Date  06/19/18       OT SHORT TERM GOAL #5   Title  Pt will create and/or implement functional routine to improve successful engagement in daily tasks prior to reintegrating into community    Time  4    Period  Weeks    Status  On-going    Target Date  06/19/18                 Patient will benefit from skilled therapeutic intervention in order to improve the following deficits and impairments:     Visit Diagnosis: MDD (major depressive disorder), recurrent severe, without psychosis (HCC)  PTSD (post-traumatic stress disorder)  Generalized anxiety disorder  Difficulty coping    Problem List Patient Active Problem List   Diagnosis Date Noted  . PTSD (post-traumatic stress disorder) 04/15/2017  . Major depressive disorder, recurrent episode, moderate (HCC) 04/30/2016  . Generalized anxiety disorder 04/30/2016   Dalphine Handing, MSOT, OTR/L Behavioral Health OT/ Acute Relief OT PHP Office: [phone]  Dalphine HandingKaylee Trust Leh 05/30/2018, 1:28 PM  [facility] PARTIAL HOSPITAL[facility] PROGRAM [address] [place], [place], [address] Phone: [phone]   Fax:  [phone]  Name: Mark [name] MRN: [id number] Date of Birth: Dec 05, 1960

## 2018-06-02 ENCOUNTER — Encounter (HOSPITAL_COMMUNITY): Payer: Self-pay

## 2018-06-02 ENCOUNTER — Encounter (HOSPITAL_COMMUNITY): Payer: Self-pay | Admitting: Licensed Clinical Social Worker

## 2018-06-02 ENCOUNTER — Other Ambulatory Visit (HOSPITAL_COMMUNITY): Payer: No Typology Code available for payment source | Admitting: Licensed Clinical Social Worker

## 2018-06-02 ENCOUNTER — Other Ambulatory Visit: Payer: Self-pay

## 2018-06-02 ENCOUNTER — Ambulatory Visit (HOSPITAL_COMMUNITY): Payer: 59 | Admitting: Licensed Clinical Social Worker

## 2018-06-02 DIAGNOSIS — F4310 Post-traumatic stress disorder, unspecified: Secondary | ICD-10-CM

## 2018-06-02 DIAGNOSIS — I10 Essential (primary) hypertension: Secondary | ICD-10-CM | POA: Diagnosis not present

## 2018-06-02 DIAGNOSIS — F332 Major depressive disorder, recurrent severe without psychotic features: Secondary | ICD-10-CM

## 2018-06-02 DIAGNOSIS — Z794 Long term (current) use of insulin: Secondary | ICD-10-CM | POA: Diagnosis not present

## 2018-06-02 DIAGNOSIS — F411 Generalized anxiety disorder: Secondary | ICD-10-CM

## 2018-06-02 DIAGNOSIS — E785 Hyperlipidemia, unspecified: Secondary | ICD-10-CM | POA: Diagnosis not present

## 2018-06-02 DIAGNOSIS — Z79899 Other long term (current) drug therapy: Secondary | ICD-10-CM | POA: Diagnosis not present

## 2018-06-02 DIAGNOSIS — E119 Type 2 diabetes mellitus without complications: Secondary | ICD-10-CM | POA: Diagnosis not present

## 2018-06-02 DIAGNOSIS — Z89611 Acquired absence of right leg above knee: Secondary | ICD-10-CM | POA: Diagnosis not present

## 2018-06-02 NOTE — Progress Notes (Signed)
Virtual Visit via Video Note  I connected with Mark [name] on 06/02/18 at  5:00 PM EDT by a video enabled telemedicine application and verified that I am speaking with the correct person using two identifiers.   I discussed the limitations of evaluation and management by telemedicine and the availability of in person appointments. The patient expressed understanding and agreed to proceed.  History of Present Illness: Pt was referred by [facility] community care for depression and ptsd.     Observations/Objective: Pt presents  via webex depressed during the veteran's group.    Summary of Progress:  Patient participated in a discussion on the difficulty of setting healthy boundaries within a family unit. Discussed 5 values for setting health boundaries: protect yourself, give yourself permission to do what's best for you, know your triggers, be clear about your needs and communicate them, and practice saying no. Patient was encouraged to value yourself and your time.      Follow Up Instructions:    I discussed the assessment and treatment plan with the patient. The patient . was provided an opportunity to ask questions and all were answered. The patient agreed with the plan and demonstrated an understanding of the instructions.   The patient was advised to call back or seek an in-person evaluation if the symptoms worsen or if the condition fails to improve as anticipated.  I provided 60 minutes of non-face-to-face time during this encounter.   Vance Belcourt S, LCAS

## 2018-06-03 ENCOUNTER — Other Ambulatory Visit: Payer: Self-pay

## 2018-06-03 ENCOUNTER — Encounter (HOSPITAL_COMMUNITY): Payer: Self-pay

## 2018-06-03 ENCOUNTER — Encounter (HOSPITAL_COMMUNITY): Payer: Self-pay | Admitting: Family

## 2018-06-03 ENCOUNTER — Ambulatory Visit (HOSPITAL_COMMUNITY): Payer: Medicare HMO

## 2018-06-03 ENCOUNTER — Other Ambulatory Visit (HOSPITAL_COMMUNITY): Payer: No Typology Code available for payment source | Admitting: Licensed Clinical Social Worker

## 2018-06-03 ENCOUNTER — Other Ambulatory Visit (HOSPITAL_COMMUNITY): Payer: No Typology Code available for payment source | Admitting: Occupational Therapy

## 2018-06-03 DIAGNOSIS — Z794 Long term (current) use of insulin: Secondary | ICD-10-CM | POA: Diagnosis not present

## 2018-06-03 DIAGNOSIS — F332 Major depressive disorder, recurrent severe without psychotic features: Secondary | ICD-10-CM | POA: Diagnosis not present

## 2018-06-03 DIAGNOSIS — F411 Generalized anxiety disorder: Secondary | ICD-10-CM | POA: Diagnosis not present

## 2018-06-03 DIAGNOSIS — R4589 Other symptoms and signs involving emotional state: Secondary | ICD-10-CM

## 2018-06-03 DIAGNOSIS — Z89611 Acquired absence of right leg above knee: Secondary | ICD-10-CM | POA: Diagnosis not present

## 2018-06-03 DIAGNOSIS — Z79899 Other long term (current) drug therapy: Secondary | ICD-10-CM | POA: Diagnosis not present

## 2018-06-03 DIAGNOSIS — F4310 Post-traumatic stress disorder, unspecified: Secondary | ICD-10-CM

## 2018-06-03 DIAGNOSIS — E119 Type 2 diabetes mellitus without complications: Secondary | ICD-10-CM | POA: Diagnosis not present

## 2018-06-03 DIAGNOSIS — I10 Essential (primary) hypertension: Secondary | ICD-10-CM | POA: Diagnosis not present

## 2018-06-03 DIAGNOSIS — E785 Hyperlipidemia, unspecified: Secondary | ICD-10-CM | POA: Diagnosis not present

## 2018-06-03 NOTE — Therapy (Signed)
[facility] PARTIAL HOSPITALIZATION PROGRAM [address] [place], [place], [address] Phone: [phone]   Fax:  [phone]  Occupational Therapy Treatment  Patient Details  Name: Mark [name] MRN: [id number] Date of Birth: Jul 18, 1960 Referring Provider (OT): Hillery Jacks, NP  Virtual Visit via Video Note  I connected with Mark [name] on 06/03/18 at  8:00 AM EDT by a video enabled telemedicine application and verified that I am speaking with the correct person using two identifiers.   I discussed the limitations of evaluation and management by telemedicine and the availability of in person appointments. The patient expressed understanding and agreed to proceed.  I discussed the assessment and treatment plan with the patient. The patient was provided an opportunity to ask questions and all were answered. The patient agreed with the plan and demonstrated an understanding of the instructions.   The patient was advised to call back or seek an in-person evaluation if the symptoms worsen or if the condition fails to improve as anticipated.  I provided 60 minutes of non-face-to-face time during this encounter.  Dalphine Handing, MSOT, OTR/L Behavioral Health OT/ Acute Relief OT PHP Office: [phone]  Dalphine Handing, [place]    Encounter Date: 06/03/2018  OT End of Session - 06/03/18 1723    Visit Number  5    Number of Visits  16    Date for OT Re-Evaluation  06/19/18    Authorization Type  [employer] medicare    OT Start Time  1100    OT Stop Time  1200    OT Time Calculation (min)  60 min    Activity Tolerance  Patient tolerated treatment well    Behavior During Therapy  WFL for tasks assessed/performed       Past Medical History:  Diagnosis Date  . Above knee amputation status, right   . Anxiety   . Depression   . Diabetes mellitus, type II (HCC)   . Hyperlipemia   . Hypertension   . PTSD (post-traumatic stress disorder)   . Shoulder pain      Past Surgical History:  Procedure Laterality Date  . HERNIA REPAIR    . LEG AMPUTATION Right   . parenatal cyst      There were no vitals filed for this visit.  Subjective Assessment - 06/03/18 1723    Currently in Pain?  Other (Comment)   chronic pain       S: "This is really good and will help add to my 5 year plan I made this past winter"  O: Education given on self-accountability being in line with personal values and goals to maintain occupational balance in various community settings. Pt given goal identifying worksheet to list immediate, short term, medium term, and long-term goals using a SMART goal framework (specificity, meaningful, adaptive, realistic, and time bound). Goals created as guideline for pt to practice being accountable in various situations. Pt completed work sheet of goals and encouraged to share goals with the group, with emphasis on immediate goal for check in with pt for next session to maintain accountability.   A: Pt presents with appropriate affect, engaged and participatory throughout session. Pt very motivated and engaged this date, sharing he had made a 5 year plan this past winter and researched different goal formulating methods. Pt shares that many of his goals are centered around routine management and how to build structure and predictability in his days. Educated pt that this education will come in next treatment session to  help solidify his understanding, pt agrees.   P: OT will be x3 per week while pt in PHP                   OT Education - 06/03/18 1723    Education Details  education given on goals/values    Person(s) Educated  Patient    Methods  Explanation;Handout    Comprehension  Verbalized understanding       OT Short Term Goals - 05/23/18 1236      OT SHORT TERM GOAL #1   Title  Pt will be educated on strategies to improve psychosocial skills needed to participate fully in all daily, work, and leisure  activities    Time  4    Period  Weeks    Status  On-going    Target Date  06/19/18      OT SHORT TERM GOAL #2   Title  Pt will apply psychosocial skills and coping mechanisms to daily activities in order to function independently and reintegrate into community    Time  4    Period  Weeks    Status  On-going    Target Date  06/19/18      OT SHORT TERM GOAL #3   Title  Pt will recall and/or apply 1-3 sleep hygiene strategies to improve BADL functioning prior to reintegrating into community    Time  4    Period  Weeks    Status  On-going    Target Date  06/19/18      OT SHORT TERM GOAL #4   Title  Pt will engage in goal setting to improve functional BADL/IADL routine upon reintegrating into community    Time  4    Period  Weeks    Status  On-going    Target Date  06/19/18      OT SHORT TERM GOAL #5   Title  Pt will create and/or implement functional routine to improve successful engagement in daily tasks prior to reintegrating into community    Time  4    Period  Weeks    Status  On-going    Target Date  06/19/18               Plan - 06/03/18 1724    OT Occupational Profile and History  Detailed Assessment- Review of Records and additional review of physical, cognitive, psychosocial history related to current functional performance    Occupational performance deficits (Please refer to evaluation for details):  ADL's;IADL's;Rest and Sleep;Work;Education;Leisure;Social Participation    Body Structure / Function / Physical Skills  ADL    Psychosocial Skills  Coping Strategies;Habits;Routines and Behaviors;Environmental  Adaptations;Interpersonal Interaction       Patient will benefit from skilled therapeutic intervention in order to improve the following deficits and impairments:  Body Structure / Function / Physical Skills, Psychosocial Skills  Visit Diagnosis: MDD (major depressive disorder), recurrent severe, without psychosis (HCC)  PTSD (post-traumatic stress  disorder)  Generalized anxiety disorder  Difficulty coping    Problem List Patient Active Problem List   Diagnosis Date Noted  . PTSD (post-traumatic stress disorder) 04/15/2017  . Major depressive disorder, recurrent episode, moderate (HCC) 04/30/2016  . Generalized anxiety disorder 04/30/2016   Dalphine Handing, MSOT, OTR/L Behavioral Health OT/ Acute Relief OT PHP Office: [phone]   Dalphine Handing 06/03/2018, 5:24 PM  [facility] PARTIAL HOSPITALIZATION PROGRAM [address] [place], [place], [address] Phone: [phone]   Fax:  [phone]  Name: Mark Hoesric Oak MRN: [id number] Date of Birth: 1960-04-20

## 2018-06-03 NOTE — Progress Notes (Signed)
Virtual Visit via Video Note  I connected with Mark [name] on 06/03/18 at  9:00 AM EDT by a video enabled telemedicine application and verified that I am speaking with the correct person using two identifiers.   I discussed the limitations of evaluation and management by telemedicine and the availability of in person appointments. The patient expressed understanding and agreed to proceed.   I discussed the assessment and treatment plan with the patient. The patient was provided an opportunity to ask questions and all were answered. The patient agreed with the plan and demonstrated an understanding of the instructions.   The patient was advised to call back or seek an in-person evaluation if the symptoms worsen or if the condition fails to improve as anticipated.  I provided 00 minutes of non-face-to-face time during this encounter.   Mark Rack, NP   BH MD/PA/NP OP Progress Note  06/03/2018 8:57 AM Mark [name]  MRN:  [id number]    Evaluation: Mark [name] was evaluated via WebEx.  He is awake alert and oriented x3.  Presents irritable but pleasant.  Continues to endorse " non-restful sleep."  Patient was encouraged to take 25 mg of trazodone with a repeat in 1 hour however patient stated he feels he needs a full 50 mg nightly.  States he is normally "winding down around midnight".  Discussed lingering effects of medication daily grogginess could be related to time of day taking medications.  Patient agreed, was encouraged to work on sleeping hygiene techniques as provided during group setting.  Continues to report underlying passive ideations.  Denies intent or plan.  Patient reported unintentional fall due to walking his dog. Reported  plans to follow-up with his primary care provider at the [facility] if pain does not resolve.  Patient inquired about initiating Xanax for panic attacks.  However did not elaborate or reports that he is having anxiety/panic attacks.  Continues to report mood  irritability and moments of worsening depression. "  Some days are worse than others."  Patient does state that he is taking medications as directed and tolerating medications well.  Reports a good appetite.  Support encouragement reassurance was provided  Visit Diagnosis:    ICD-10-CM   1. MDD (major depressive disorder), recurrent severe, without psychosis (HCC) F33.2   2. PTSD (post-traumatic stress disorder) F43.10   3. Generalized anxiety disorder F41.1     Past Psychiatric History:   Past Medical History:  Past Medical History:  Diagnosis Date  . Above knee amputation status, right   . Anxiety   . Depression   . Diabetes mellitus, type II (HCC)   . Hyperlipemia   . Hypertension   . PTSD (post-traumatic stress disorder)   . Shoulder pain     Past Surgical History:  Procedure Laterality Date  . HERNIA REPAIR    . LEG AMPUTATION Right   . parenatal cyst      Family Psychiatric History:   Family History:  Family History  Problem Relation Age of Onset  . Aneurysm Mother   . Leukemia Father   . Dementia Maternal Aunt   . Seizures Paternal Uncle     Social History:  Social History   Socioeconomic History  . Marital status: Married    Spouse name: Not on file  . Number of children: Not on file  . Years of education: Not on file  . Highest education level: Not on file  Occupational History  . Not on file  Social Needs  . Financial  resource strain: Not very hard  . Food insecurity:    Worry: Never true    Inability: Never true  . Transportation needs:    Medical: No    Non-medical: No  Tobacco Use  . Smoking status: Current Every Day Smoker    Packs/day: 1.50    Years: 35.00    Pack years: 52.50    Types: Cigarettes  . Smokeless tobacco: Never Used  . Tobacco comment: Had quit for 3 years then restarted and back up to 1 pk a day.  Substance and Sexual Activity  . Alcohol use: Yes    Alcohol/week: 4.0 standard drinks    Types: 2 Cans of beer, 2 Shots  of liquor per week    Comment: Reports light drinking weekly.  . Drug use: Yes    Frequency: 14.0 times per week    Types: Marijuana    Comment: Uses 1-2 times a day per report for his anxiety  . Sexual activity: Not Currently  Lifestyle  . Physical activity:    Days per week: 2 days    Minutes per session: 20 min  . Stress: Very much  Relationships  . Social connections:    Talks on phone: More than three times a week    Gets together: Never    Attends religious service: More than 4 times per year    Active member of club or organization: Yes    Attends meetings of clubs or organizations: More than 4 times per year    Relationship status: Separated  Other Topics Concern  . Not on file  Social History Narrative  . Not on file    Allergies:  Allergies  Allergen Reactions  . Ozempic (0.25 Or 0.5 Mg-Dose) [Semaglutide(0.25 Or 0.5mg -Dos)] Other (See Comments)    Caused Pancreatitis  . Lyrica [Pregabalin] Rash    Metabolic Disorder Labs: No results found for: HGBA1C, MPG No results found for: PROLACTIN No results found for: CHOL, TRIG, HDL, CHOLHDL, VLDL, LDLCALC No results found for: TSH  Therapeutic Level Labs: No results found for: LITHIUM No results found for: VALPROATE No components found for:  CBMZ  Current Medications: Current Outpatient Medications  Medication Sig Dispense Refill  . atorvastatin (LIPITOR) 80 MG tablet Take 80 mg by mouth daily.    Marland Kitchen. buPROPion (WELLBUTRIN XL) 300 MG 24 hr tablet Take 1 tablet (300 mg total) by mouth daily. 90 tablet 0  . cyclobenzaprine (FLEXERIL) 10 MG tablet Take 10 mg by mouth 2 (two) times daily as needed for muscle spasms.    . hydrOXYzine (ATARAX/VISTARIL) 25 MG tablet Take 25 mg by mouth 2 (two) times daily as needed. Takes 1-2 tablets twice a day as needed for anxiety    . insulin aspart protamine- aspart (NOVOLOG MIX 70/30) (70-30) 100 UNIT/ML injection Inject into the skin.    . Insulin Glargine (LANTUS [place]) Inject into  the skin.    Marland Kitchen. lamoTRIgine (LAMICTAL) 100 MG tablet Take 1 tablet (100 mg total) by mouth daily. 90 tablet 0  . lisinopril (PRINIVIL,ZESTRIL) 5 MG tablet Take 5 mg by mouth daily.    . metFORMIN (GLUCOPHAGE) 1000 MG tablet Take 1,000 mg by mouth 2 (two) times daily with a meal.    . oxyCODONE-acetaminophen (PERCOCET) 5-325 MG tablet Take by mouth every 4 (four) hours as needed for severe pain.    . prazosin (MINIPRESS) 2 MG capsule Take 1 capsule (2 mg total) by mouth at bedtime. 90 capsule 0  . sildenafil (VIAGRA) 100  MG tablet Take 100 mg by mouth daily as needed for erectile dysfunction.    . traZODone (DESYREL) 50 MG tablet Take 50 mg by mouth at bedtime.     No current facility-administered medications for this visit.      Musculoskeletal: Strength & Muscle Tone: within normal limits Gait & Station: Wheelchair reported use Patient leans: N/A  Psychiatric Specialty Exam: ROS  There were no vitals taken for this visit.There is no height or weight on file to calculate BMI.  General Appearance: Casual  Eye Contact:  Fair  Speech:  Clear and Coherent  Volume:  Normal  Mood:  Anxious and Depressed  Affect:  Appropriate, Depressed and Flat  Thought Process:  Coherent  Orientation:  Full (Time, Place, and Person)  Thought Content: WDL   Suicidal Thoughts:  No  Homicidal Thoughts:  No  Memory:  Immediate;   Fair Recent;   Fair Remote;   Fair  Judgement:  Fair  Insight:  Fair  Psychomotor Activity:  NA  Concentration:  Concentration: Fair  Recall:  [employer] of Knowledge: Fair  Language: Fair  Akathisia:  No  Handed:  Right  AIMS (if indicated):  Assets:  Communication Skills Desire for Improvement Resilience Social Support  ADL's:  Intact  Cognition: WNL  Sleep:  Poor   Screenings: GAD-7     Counselor from 06/03/2017 in BEHAVIORAL HEALTH OUTPATIENT THERAPY [place]  Total GAD-7 Score  12    PHQ2-9     Counselor from 05/22/2018 in BEHAVIORAL HEALTH PARTIAL  HOSPITALIZATION PROGRAM Counselor from 11/27/2017 in BEHAVIORAL HEALTH OUTPATIENT THERAPY [place] Counselor from 06/03/2017 in BEHAVIORAL HEALTH OUTPATIENT THERAPY [place]  PHQ-2 Total Score  PHQ-9 Total Score  Assessment and Plan:  Continue partial hospitalization programming Continue medications as directed Keep follow-up appointments with attending psychiatrist at the [facility]  Treatment plan was reviewed and agreed upon by NP T. Janah Mcculloh inpatient [place] need for continued group services   Mark Rack, NP 06/03/2018, 8:57 AM

## 2018-06-04 ENCOUNTER — Other Ambulatory Visit (HOSPITAL_COMMUNITY): Payer: No Typology Code available for payment source | Admitting: Licensed Clinical Social Worker

## 2018-06-04 ENCOUNTER — Other Ambulatory Visit: Payer: Self-pay

## 2018-06-04 DIAGNOSIS — F4310 Post-traumatic stress disorder, unspecified: Secondary | ICD-10-CM | POA: Diagnosis not present

## 2018-06-04 DIAGNOSIS — E119 Type 2 diabetes mellitus without complications: Secondary | ICD-10-CM | POA: Diagnosis not present

## 2018-06-04 DIAGNOSIS — F332 Major depressive disorder, recurrent severe without psychotic features: Secondary | ICD-10-CM

## 2018-06-04 DIAGNOSIS — Z794 Long term (current) use of insulin: Secondary | ICD-10-CM | POA: Diagnosis not present

## 2018-06-04 DIAGNOSIS — F411 Generalized anxiety disorder: Secondary | ICD-10-CM

## 2018-06-04 DIAGNOSIS — Z89611 Acquired absence of right leg above knee: Secondary | ICD-10-CM | POA: Diagnosis not present

## 2018-06-04 DIAGNOSIS — Z79899 Other long term (current) drug therapy: Secondary | ICD-10-CM | POA: Diagnosis not present

## 2018-06-04 DIAGNOSIS — I10 Essential (primary) hypertension: Secondary | ICD-10-CM | POA: Diagnosis not present

## 2018-06-04 DIAGNOSIS — E785 Hyperlipidemia, unspecified: Secondary | ICD-10-CM | POA: Diagnosis not present

## 2018-06-04 NOTE — Progress Notes (Signed)
Pt attended spiritual care group 06/04/2018 11:00-12:00.  Group met via web-ex due to COVID-19 precautions.  Group facilitated by Simone Curia, MDiv, [place]   Group focused on topic of "community."  Members reflected on topic in facilitated dialog, identifying responses to topic and notions they hold of community from their previous experience.  Group members utilized value sort cards to identify top qualities they look for in community.  Engaged in facilitated dialog around their value choices, noting origin of these values, how these are realized in their lives, and strategies for engaging these values.    Spiritual care group drew on Motivational Interviewing, Narrative and Adlerian modalities.  Mark [name] was present throughout group.  Noted his chief values in community are "God's Will and faith"  Related to other group members as they noted "Growth" as a value.  Mark [name] stated he saw community as moving outward from relationship with self to relationship with others.  Stated he needs to care for self and know his own needs in order to relate to others.  Particularly interested in using values in community as a way of thinking about setting boundaries and making choices in situations of competing needs.

## 2018-06-05 ENCOUNTER — Other Ambulatory Visit (HOSPITAL_COMMUNITY): Payer: No Typology Code available for payment source | Admitting: Licensed Clinical Social Worker

## 2018-06-05 ENCOUNTER — Ambulatory Visit (HOSPITAL_COMMUNITY): Payer: Medicare HMO | Admitting: Licensed Clinical Social Worker

## 2018-06-05 ENCOUNTER — Other Ambulatory Visit (HOSPITAL_COMMUNITY): Payer: No Typology Code available for payment source | Admitting: Occupational Therapy

## 2018-06-05 ENCOUNTER — Other Ambulatory Visit: Payer: Self-pay

## 2018-06-05 ENCOUNTER — Encounter (HOSPITAL_COMMUNITY): Payer: Self-pay | Admitting: Occupational Therapy

## 2018-06-05 ENCOUNTER — Ambulatory Visit (HOSPITAL_COMMUNITY): Payer: Medicare HMO

## 2018-06-05 ENCOUNTER — Encounter (HOSPITAL_COMMUNITY): Payer: Self-pay

## 2018-06-05 VITALS — Ht 74.0 in | Wt 305.0 lb

## 2018-06-05 DIAGNOSIS — F411 Generalized anxiety disorder: Secondary | ICD-10-CM | POA: Diagnosis not present

## 2018-06-05 DIAGNOSIS — Z794 Long term (current) use of insulin: Secondary | ICD-10-CM | POA: Diagnosis not present

## 2018-06-05 DIAGNOSIS — F4310 Post-traumatic stress disorder, unspecified: Secondary | ICD-10-CM

## 2018-06-05 DIAGNOSIS — E119 Type 2 diabetes mellitus without complications: Secondary | ICD-10-CM | POA: Diagnosis not present

## 2018-06-05 DIAGNOSIS — F332 Major depressive disorder, recurrent severe without psychotic features: Secondary | ICD-10-CM

## 2018-06-05 DIAGNOSIS — Z89611 Acquired absence of right leg above knee: Secondary | ICD-10-CM | POA: Diagnosis not present

## 2018-06-05 DIAGNOSIS — R4589 Other symptoms and signs involving emotional state: Secondary | ICD-10-CM

## 2018-06-05 DIAGNOSIS — F331 Major depressive disorder, recurrent, moderate: Secondary | ICD-10-CM

## 2018-06-05 DIAGNOSIS — I10 Essential (primary) hypertension: Secondary | ICD-10-CM | POA: Diagnosis not present

## 2018-06-05 DIAGNOSIS — Z79899 Other long term (current) drug therapy: Secondary | ICD-10-CM | POA: Diagnosis not present

## 2018-06-05 DIAGNOSIS — E785 Hyperlipidemia, unspecified: Secondary | ICD-10-CM | POA: Diagnosis not present

## 2018-06-05 NOTE — Progress Notes (Signed)
Met with patient through virtual WebEx session as he presented with restricted affect, more level mood and stated "I'm not as depressed as I was before".  Patient rated his current level of depression a 6 and anxiety a 2-3 on a scale of 0-10 with 0 being none and 10 the worst he could manage.  Patient denied any suicidal or homicidal ideations, no auditory or visual hallucinations and no plan or intent to want to harm self or others at this time.  Patient admitted he still has suicidal thoughts often but that he knows these are just thoughts and stated "I would never act on them".   Patient stated "I just don't feel as doomed" and reported the learned coping and distracting skills he has picked up in PHP have been invaluable.  Patient scored a 15 on his administered PHQ9 depression screening for the past 2 weeks, down from 24 from 05/22/18.  Patient stated the only ongoing problems he has is erectile dysfunction from some of his medications but reports he is working with his MD from the [place] to try different things to help address this issue.  Patient reported he did take both Trazodone 50 mg and Prazosin 2 mg the previous night to help sleep; however, he reported taking earlier than he typically takes the medication and did not feel as hung over this morning from taking the medications earlier.  Patient reported some increase in weight as he is staying at home most of the time with COVID-19 stay at home restrictions so agrees to continue to monitor weight gain and diet.  Patient reported no other problems at this time as he is preparing to transition out of PHP.  States plan to return to weekly therapy with Binnie Rail and wants to transition back to seeing Dr. Adele Schilder at our facility from the [facility] for psychiatric services.  Patient to arrange follow up appointments following completions of PHP.  Patient agreed to contact this nurse or PHP staff if any worsening of symptoms during transition from finishing PHP or if  any problems with current medication regimen.  Patient reported he would be glad when COVID restrictions lessen so he can attend support groups again but stated he is doing better now with no SI/HI and reported stump feeling better as well after fall earlier this week.

## 2018-06-05 NOTE — Therapy (Signed)
[facility] PARTIAL HOSPITALIZATION PROGRAM [address] [place], [place], [address] Phone: [phone]   Fax:  [phone]  Occupational Therapy Treatment  Patient Details  Name: Mark [name] MRN: [id number] Date of Birth: April 20, 1960 Referring Provider (OT): Hillery Jacks, NP  Virtual Visit via Video Note  I connected with Mark Hoes on 06/05/18 at  8:00 AM EDT by a video enabled telemedicine application and verified that I am speaking with the correct person using two identifiers.   I discussed the limitations of evaluation and management by telemedicine and the availability of in person appointments. The patient expressed understanding and agreed to proceed.   I discussed the assessment and treatment plan with the patient. The patient was provided an opportunity to ask questions and all were answered. The patient agreed with the plan and demonstrated an understanding of the instructions.   The patient was advised to call back or seek an in-person evaluation if the symptoms worsen or if the condition fails to improve as anticipated.  I provided 60 minutes of non-face-to-face time during this encounter.  Dalphine Handing, MSOT, OTR/L Behavioral Health OT/ Acute Relief OT PHP Office: [phone]  Dalphine Handing, [place]    Encounter Date: 06/05/2018  OT End of Session - 06/05/18 1441    Visit Number  6    Number of Visits  16    Date for OT Re-Evaluation  06/19/18    Authorization Type  [employer] medicare    OT Start Time  1100    OT Stop Time  1200    OT Time Calculation (min)  60 min    Activity Tolerance  Patient tolerated treatment well    Behavior During Therapy  WFL for tasks assessed/performed       Past Medical History:  Diagnosis Date  . Above knee amputation status, right   . Anxiety   . Depression   . Diabetes mellitus, type II (HCC)   . Hyperlipemia   . Hypertension   . PTSD (post-traumatic stress disorder)   . Shoulder pain      Past Surgical History:  Procedure Laterality Date  . HERNIA REPAIR    . LEG AMPUTATION Right   . parenatal cyst      There were no vitals filed for this visit.  Subjective Assessment - 06/05/18 1441    Currently in Pain?  Other (Comment)   chronic pain        O: Education given on routine management and its importance in increasing functional BADL/IADL independence. Education given on how to build daily routines, with various tips of organization and time management included. Home maintaining, meal preparation, child/pet care, work life balance, and medication management all discussed. Pt asked to share personal experiences and one new skill they would like to implement form session. Not all information covered this date and to be finished next date.  A: Pt presents with appropriate affect, engaged and participatory throughout session. Pt showing significant carryover of implementation of skill building from previous session. Pt shared with OT the personal goal and routine chart he created to help keep himself accountable. Pt discussed his routine difficulties to be personal hygiene, sleep hygiene, meal preparation, and housework, with all increasing in difficulty since the stay at home orders. Pt shares making charts and using his phone is helpful for him to manage routine. Continued education and follow up will continue next treatment date. Pt continues to be very motivated and willing to implement skill building.  P:  OT group will be x3 per week while pt in PHP                       OT Education - 06/05/18 1441    Education Details  education given on routine implementation and management    Person(s) Educated  Patient    Methods  Explanation;Handout    Comprehension  Verbalized understanding       OT Short Term Goals - 05/23/18 1236      OT SHORT TERM GOAL #1   Title  Pt will be educated on strategies to improve psychosocial skills needed to participate  fully in all daily, work, and leisure activities    Time  4    Period  Weeks    Status  On-going    Target Date  06/19/18      OT SHORT TERM GOAL #2   Title  Pt will apply psychosocial skills and coping mechanisms to daily activities in order to function independently and reintegrate into community    Time  4    Period  Weeks    Status  On-going    Target Date  06/19/18      OT SHORT TERM GOAL #3   Title  Pt will recall and/or apply 1-3 sleep hygiene strategies to improve BADL functioning prior to reintegrating into community    Time  4    Period  Weeks    Status  On-going    Target Date  06/19/18      OT SHORT TERM GOAL #4   Title  Pt will engage in goal setting to improve functional BADL/IADL routine upon reintegrating into community    Time  4    Period  Weeks    Status  On-going    Target Date  06/19/18      OT SHORT TERM GOAL #5   Title  Pt will create and/or implement functional routine to improve successful engagement in daily tasks prior to reintegrating into community    Time  4    Period  Weeks    Status  On-going    Target Date  06/19/18               Plan - 06/05/18 1442    Occupational performance deficits (Please refer to evaluation for details):  ADL's;IADL's;Rest and Sleep;Work;Education;Leisure;Social Participation    Body Structure / Function / Physical Skills  ADL    Psychosocial Skills  Coping Strategies;Habits;Routines and Behaviors;Environmental  Adaptations;Interpersonal Interaction       Patient will benefit from skilled therapeutic intervention in order to improve the following deficits and impairments:  Body Structure / Function / Physical Skills, Psychosocial Skills  Visit Diagnosis: MDD (major depressive disorder), recurrent severe, without psychosis (HCC)  PTSD (post-traumatic stress disorder)  Generalized anxiety disorder  Difficulty coping    Problem List Patient Active Problem List   Diagnosis Date Noted  . PTSD  (post-traumatic stress disorder) 04/15/2017  . Major depressive disorder, recurrent episode, moderate (HCC) 04/30/2016  . Generalized anxiety disorder 04/30/2016   Dalphine HandingKaylee Maziah Smola, MSOT, OTR/L Behavioral Health OT/ Acute Relief OT PHP Office: [phone]  Dalphine HandingKaylee Matalynn Graff 06/05/2018, 2:43 PM  [facility] [address] [place], [place], [address] Phone: [phone]   Fax:  [phone]  Name: Mark [name] MRN: [id number] Date of Birth: [birth year]

## 2018-06-06 ENCOUNTER — Other Ambulatory Visit (HOSPITAL_COMMUNITY): Payer: No Typology Code available for payment source | Admitting: Occupational Therapy

## 2018-06-06 ENCOUNTER — Other Ambulatory Visit: Payer: Self-pay

## 2018-06-06 ENCOUNTER — Encounter (HOSPITAL_COMMUNITY): Payer: Self-pay | Admitting: Occupational Therapy

## 2018-06-06 ENCOUNTER — Other Ambulatory Visit (HOSPITAL_COMMUNITY): Payer: No Typology Code available for payment source | Admitting: Licensed Clinical Social Worker

## 2018-06-06 DIAGNOSIS — F411 Generalized anxiety disorder: Secondary | ICD-10-CM

## 2018-06-06 DIAGNOSIS — Z89611 Acquired absence of right leg above knee: Secondary | ICD-10-CM | POA: Diagnosis not present

## 2018-06-06 DIAGNOSIS — E119 Type 2 diabetes mellitus without complications: Secondary | ICD-10-CM | POA: Diagnosis not present

## 2018-06-06 DIAGNOSIS — E785 Hyperlipidemia, unspecified: Secondary | ICD-10-CM | POA: Diagnosis not present

## 2018-06-06 DIAGNOSIS — F4310 Post-traumatic stress disorder, unspecified: Secondary | ICD-10-CM

## 2018-06-06 DIAGNOSIS — Z794 Long term (current) use of insulin: Secondary | ICD-10-CM | POA: Diagnosis not present

## 2018-06-06 DIAGNOSIS — F332 Major depressive disorder, recurrent severe without psychotic features: Secondary | ICD-10-CM | POA: Diagnosis not present

## 2018-06-06 DIAGNOSIS — R4589 Other symptoms and signs involving emotional state: Secondary | ICD-10-CM

## 2018-06-06 DIAGNOSIS — I10 Essential (primary) hypertension: Secondary | ICD-10-CM | POA: Diagnosis not present

## 2018-06-06 DIAGNOSIS — Z79899 Other long term (current) drug therapy: Secondary | ICD-10-CM | POA: Diagnosis not present

## 2018-06-06 NOTE — Therapy (Signed)
[facility] PARTIAL HOSPITALIZATION PROGRAM [address] [place], [place], [address] Phone: [phone]   Fax:  [phone]  Occupational Therapy Treatment  Patient Details  Name: Mark [name] MRN: [id number] Date of Birth: Jun 04, 1960 Referring Provider (OT): Hillery Jacks, NP  Virtual Visit via Video Note  I connected with Mark [name] on 06/06/18 at  8:00 AM EDT by a video enabled telemedicine application and verified that I am speaking with the correct person using two identifiers.   I discussed the limitations of evaluation and management by telemedicine and the availability of in person appointments. The patient expressed understanding and agreed to proceed.  I discussed the assessment and treatment plan with the patient. The patient was provided an opportunity to ask questions and all were answered. The patient agreed with the plan and demonstrated an understanding of the instructions.   The patient was advised to call back or seek an in-person evaluation if the symptoms worsen or if the condition fails to improve as anticipated.  I provided 60 minutes of non-face-to-face time during this encounter.  Dalphine Handing, MSOT, OTR/L Behavioral Health OT/ Acute Relief OT PHP Office: [phone]  Dalphine Handing, [place]    Encounter Date: 06/06/2018  OT End of Session - 06/06/18 1349    Visit Number  7    Number of Visits  16    Date for OT Re-Evaluation  06/19/18    Authorization Type  [employer]    OT Start Time  1100    OT Stop Time  1200    OT Time Calculation (min)  60 min    Activity Tolerance  Patient tolerated treatment well    Behavior During Therapy  WFL for tasks assessed/performed       Past Medical History:  Diagnosis Date  . Above knee amputation status, right   . Anxiety   . Depression   . Diabetes mellitus, type II (HCC)   . Hyperlipemia   . Hypertension   . PTSD (post-traumatic stress disorder)   . Shoulder pain      Past Surgical History:  Procedure Laterality Date  . HERNIA REPAIR    . LEG AMPUTATION Right   . parenatal cyst      There were no vitals filed for this visit.  Subjective Assessment - 06/06/18 1348    Currently in Pain?  Other (Comment)   chronic pain      S: "I would like to share my routine table with others if they want to see it"   O: Continued education given on routine management from previous session and its importance in increasing functional BADL/IADL independence. Education given on how to build daily routines, with various tips of organization and time management included. Home maintaining, meal preparation, child/pet care, work life balance, and medication management all discussed. Pt asked to share personal experiences and one new skill they would like to implement form session.   A: Pt presents with appropriate affect, engaged and participatory throughout session. Pt very motivated and willing to work with OT, showing carryover from previous sessions by naming skills he has implemented. Pt shared his routine he had made in a word document, and offered to share it with other group members who would like a blank copy. OT securely emailed a copy to those requesting. Pt shares it's very helpful for him to write things down and categorize them so he can have better management of the day. He also shares he is going to make  a "could do" list to he can better think of things to do when feeling bored/isolated in quarantine. Pt also shares he will further look into pill organizers and medication apps to increase his compliance with med management. 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 Pt continues to show improvement in variety of skills learned, and always eager to implement new strategies.  P: OT group will be x3 per week while pt in PHP                    OT  Education - 06/06/18 1349    Education Details  continued education given on routine implementation and management    Person(s) Educated  Patient    Methods  Explanation;Handout    Comprehension  Verbalized understanding       OT Short Term Goals - 05/23/18 1236      OT SHORT TERM GOAL #1   Title  Pt will be educated on strategies to improve psychosocial skills needed to participate fully in all daily, work, and leisure activities    Time  4    Period  Weeks    Status  On-going    Target Date  06/19/18      OT SHORT TERM GOAL #2   Title  Pt will apply psychosocial skills and coping mechanisms to daily activities in order to function independently and reintegrate into community    Time  4    Period  Weeks    Status  On-going    Target Date  06/19/18      OT SHORT TERM GOAL #3   Title  Pt will recall and/or apply 1-3 sleep hygiene strategies to improve BADL functioning prior to reintegrating into community    Time  4    Period  Weeks    Status  On-going    Target Date  06/19/18      OT SHORT TERM GOAL #4   Title  Pt will engage in goal setting to improve functional BADL/IADL routine upon reintegrating into community    Time  4    Period  Weeks    Status  On-going    Target Date  06/19/18      OT SHORT TERM GOAL #5   Title  Pt will create and/or implement functional routine to improve successful engagement in daily tasks prior to reintegrating into community    Time  4    Period  Weeks    Status  On-going    Target Date  06/19/18               Plan - 06/06/18 1349    Occupational performance deficits (Please refer to evaluation for details):  ADL's;IADL's;Rest and Sleep;Work;Education;Leisure;Social Participation    Body Structure / Function / Physical Skills  ADL    Psychosocial Skills  Coping Strategies;Habits;Routines and Behaviors;Environmental  Adaptations;Interpersonal Interaction       Patient will benefit from skilled therapeutic intervention in  order to improve the following deficits and impairments:  Body Structure / Function / Physical Skills, Psychosocial Skills  Visit Diagnosis: MDD (major depressive disorder), recurrent severe, without psychosis (HCC)  PTSD (post-traumatic stress disorder)  Generalized anxiety disorder  Difficulty coping    Problem List Patient Active Problem List   Diagnosis Date Noted  . PTSD (post-traumatic stress disorder) 04/15/2017  . Major depressive disorder, recurrent episode, moderate (HCC)  04/30/2016  . Generalized anxiety disorder 04/30/2016   Dalphine Handing, MSOT, OTR/L Behavioral Health OT/ Acute Relief OT PHP Office: [phone]  Dalphine Handing 06/06/2018, 1:50 PM  [facility] HOSPITALIZATION PROGRAM [address] [place], [place], [address] Phone: [phone]   Fax:  [phone]  Name: Mark [name] MRN: [id number] Date of Birth: [birth year]

## 2018-06-09 ENCOUNTER — Other Ambulatory Visit (HOSPITAL_COMMUNITY): Payer: No Typology Code available for payment source | Admitting: Licensed Clinical Social Worker

## 2018-06-09 ENCOUNTER — Ambulatory Visit (HOSPITAL_COMMUNITY): Payer: Medicare HMO | Admitting: Licensed Clinical Social Worker

## 2018-06-09 ENCOUNTER — Other Ambulatory Visit: Payer: Self-pay

## 2018-06-09 DIAGNOSIS — F4310 Post-traumatic stress disorder, unspecified: Secondary | ICD-10-CM | POA: Diagnosis not present

## 2018-06-09 DIAGNOSIS — F332 Major depressive disorder, recurrent severe without psychotic features: Secondary | ICD-10-CM

## 2018-06-09 DIAGNOSIS — F411 Generalized anxiety disorder: Secondary | ICD-10-CM | POA: Diagnosis not present

## 2018-06-09 DIAGNOSIS — Z89611 Acquired absence of right leg above knee: Secondary | ICD-10-CM | POA: Diagnosis not present

## 2018-06-09 DIAGNOSIS — E119 Type 2 diabetes mellitus without complications: Secondary | ICD-10-CM | POA: Diagnosis not present

## 2018-06-09 DIAGNOSIS — Z79899 Other long term (current) drug therapy: Secondary | ICD-10-CM | POA: Diagnosis not present

## 2018-06-09 DIAGNOSIS — I10 Essential (primary) hypertension: Secondary | ICD-10-CM | POA: Diagnosis not present

## 2018-06-09 DIAGNOSIS — Z794 Long term (current) use of insulin: Secondary | ICD-10-CM | POA: Diagnosis not present

## 2018-06-09 DIAGNOSIS — E785 Hyperlipidemia, unspecified: Secondary | ICD-10-CM | POA: Diagnosis not present

## 2018-06-09 NOTE — Psych (Signed)
Virtual Visit via Video Note  I connected with Mark [name] on 05/26/18 at  9:00 AM EDT by a video enabled telemedicine application and verified that I am speaking with the correct person using two identifiers.   I discussed the limitations of evaluation and management by telemedicine and the availability of in person appointments. The patient expressed understanding and agreed to proceed.   I discussed the assessment and treatment plan with the patient. The patient was provided an opportunity to ask questions and all were answered. The patient agreed with the plan and demonstrated an understanding of the instructions.   The patient was advised to call back or seek an in-person evaluation if the symptoms worsen or if the condition fails to improve as anticipated.  Pt was provided 240 minutes of non-face-to-face time during this encounter.   [name], LCSW    [facility] PHP THERAPIST PROGRESS NOTE  Mark [name] [id number]  Session Time: 9:00 - 10:00  Participation Level: Active  Behavioral Response: CasualAlertDepressed  Type of Therapy: Group Therapy  Treatment Goals addressed: Coping  Interventions: CBT, DBT, Supportive and Reframing  Summary: Clinician led check-in regarding current stressors and situation, and review of patient completed daily inventory. Clinician utilized active listening and empathetic response and validated patient emotions. Clinician facilitated processing group on pertinent issues  Therapist Response: Mark [name] is a 58 y.o. male who presents with depression and anxiety symptoms.  Patient arrived within time allowed and reports that he is feeling"in a bad head space."Patient rates his mood at a3.5 on a scale of 1-10 with 10 being great. Pt reports his weekend "wasn't that bad" and that his nephew and his sons came over; he spoke to his wife twice and there were no arguments; and he achieved goal of clearing off his bed. Pt struggles with mood  dependent thinking and being able to see past his bad mood. Pt engaged in discussion.       Session Time: 10:00 -11:00  Participation Level: Active  Behavioral Response: CasualAlertDepressed  Type of Therapy: Group Therapy, psychoeducation, psychotherapy  Treatment Goals addressed: Coping  Interventions: CBT, DBT, Solution Focused, Supportive and Reframing  Summary:  Cln introduced concept of balance and how to balance two "opposites" by allowing contradicting ideas to live together, such as I love you and you annoy me right now. Cln included discussion of perception and the reminder that the problem is not always the problem, but instead it is how we are viewing the problem that is causing issues. Group discussed how this may be creating obstacles in their lives.  Therapist Response: Pt shares he "feels bad" because he was upset and jealous when another group member shared they were happy. Pt states this is "against who I am" and he is struggling with the feelings he is having. Pt is able to connect with the concept if two opposites living together such as him being jealous that he is not happy and being glad that someone else is happy. Pt accepts the challenge that his perception of his feelings, ie that they are bad and deserve judgment, may be contributing to his conflict and states "I'm going to have to think about that."      Session Time: 11:00 - 12:00  Participation Level:Active  Behavioral Response:CasualAlertDepressed  Type of Therapy: Group Therapy, OT  Treatment Goals addressed: Coping  Interventions:Psychosocial skills training, Supportive,   Summary:Occupational Therapy group  Therapist Response: Patient engaged in group. See OT note.  Session Time: 12:00- 1:00  Participation Level: Active  Behavioral Response: CasualAlertDepressed  Type of Therapy: Group Therapy, Psychoeducation; Psychotherapy  Treatment Goals  addressed: Coping  Interventions: CBT; Solution focused; Supportive; Reframing  Summary: 12:00 - 12:50 Cln continued topic of boundaries, introducing how to set boundaries. Cln reviewed the importance of recognizing values, communicating needs, and using assertiveness when setting a boundary. Group practiced setting boundaries with basic examples.  12:50 -1:00 Clinician led check-out. Clinician assessed for immediate needs, medication compliance and efficacy, and safety concerns   Therapist Response: Patient reports understanding of how to set a boundary however struggles with applying it to himself and focuses more on how other people are violating his boundaries instead.  At checkout, pt rates his mood at a 6.5 on a scale of 1-10 with 10 being great. Patient afternoon plans of ordering food, reading, and "trying to chill." Patient demonstrates some progress as evidenced by improved mood by end of group. Pt denies SI/HI at end of group.       Suicidal/Homicidal: Nowithout intent/plan   Plan: Pt will continue in PHP while working to decrease depression and anxiety symptoms and increase ability to manage symptoms in a healthy manner.   Diagnosis: MDD (major depressive disorder), recurrent severe, without psychosis (HCC) [F33.2]    1. MDD (major depressive disorder), recurrent severe, without psychosis (HCC)   2. PTSD (post-traumatic stress disorder)   3. Generalized anxiety disorder       Donia GuilesJenny Lavone Barrientes, LCSW 06/09/2018

## 2018-06-10 ENCOUNTER — Encounter (HOSPITAL_COMMUNITY): Payer: Self-pay

## 2018-06-10 ENCOUNTER — Other Ambulatory Visit (HOSPITAL_COMMUNITY): Payer: No Typology Code available for payment source | Admitting: Licensed Clinical Social Worker

## 2018-06-10 ENCOUNTER — Other Ambulatory Visit: Payer: Self-pay

## 2018-06-10 ENCOUNTER — Encounter (HOSPITAL_COMMUNITY): Payer: Self-pay | Admitting: Licensed Clinical Social Worker

## 2018-06-10 ENCOUNTER — Other Ambulatory Visit (HOSPITAL_COMMUNITY): Payer: No Typology Code available for payment source | Admitting: Occupational Therapy

## 2018-06-10 DIAGNOSIS — F332 Major depressive disorder, recurrent severe without psychotic features: Secondary | ICD-10-CM | POA: Diagnosis not present

## 2018-06-10 DIAGNOSIS — Z794 Long term (current) use of insulin: Secondary | ICD-10-CM | POA: Diagnosis not present

## 2018-06-10 DIAGNOSIS — F411 Generalized anxiety disorder: Secondary | ICD-10-CM

## 2018-06-10 DIAGNOSIS — E119 Type 2 diabetes mellitus without complications: Secondary | ICD-10-CM | POA: Diagnosis not present

## 2018-06-10 DIAGNOSIS — R4589 Other symptoms and signs involving emotional state: Secondary | ICD-10-CM

## 2018-06-10 DIAGNOSIS — F4310 Post-traumatic stress disorder, unspecified: Secondary | ICD-10-CM

## 2018-06-10 DIAGNOSIS — Z79899 Other long term (current) drug therapy: Secondary | ICD-10-CM | POA: Diagnosis not present

## 2018-06-10 DIAGNOSIS — Z89611 Acquired absence of right leg above knee: Secondary | ICD-10-CM | POA: Diagnosis not present

## 2018-06-10 DIAGNOSIS — I10 Essential (primary) hypertension: Secondary | ICD-10-CM | POA: Diagnosis not present

## 2018-06-10 DIAGNOSIS — E785 Hyperlipidemia, unspecified: Secondary | ICD-10-CM | POA: Diagnosis not present

## 2018-06-10 NOTE — Therapy (Signed)
[facility] PARTIAL HOSPITALIZATION PROGRAM [address] [place], [place], [address] Phone: [phone]   Fax:  [phone]  Occupational Therapy Treatment  Patient Details  Name: Mark [name] MRN: [id number] Date of Birth: 03-07-1960 Referring Provider (OT): Hillery Jacks, NP  Virtual Visit via Video Note  I connected with Mark [name] on 06/10/18 at  8:00 AM EDT by a video enabled telemedicine application and verified that I am speaking with the correct person using two identifiers.   I discussed the limitations of evaluation and management by telemedicine and the availability of in person appointments. The patient expressed understanding and agreed to proceed.  I discussed the assessment and treatment plan with the patient. The patient was provided an opportunity to ask questions and all were answered. The patient agreed with the plan and demonstrated an understanding of the instructions.   The patient was advised to call back or seek an in-person evaluation if the symptoms worsen or if the condition fails to improve as anticipated.  I provided 60 minutes of non-face-to-face time during this encounter.  Dalphine Handing, MSOT, OTR/L Behavioral Health OT/ Acute Relief OT PHP Office: [phone]  Dalphine Handing, [place]    Encounter Date: 06/10/2018  OT End of Session - 06/10/18 1420    Visit Number  8    Number of Visits  16    Date for OT Re-Evaluation  06/19/18    Authorization Type  [employer] medicare    OT Start Time  1100    OT Stop Time  1200    OT Time Calculation (min)  60 min    Activity Tolerance  Patient tolerated treatment well    Behavior During Therapy  WFL for tasks assessed/performed       Past Medical History:  Diagnosis Date  . Above knee amputation status, right   . Anxiety   . Depression   . Diabetes mellitus, type II (HCC)   . Hyperlipemia   . Hypertension   . PTSD (post-traumatic stress disorder)   . Shoulder pain      Past Surgical History:  Procedure Laterality Date  . HERNIA REPAIR    . LEG AMPUTATION Right   . parenatal cyst      There were no vitals filed for this visit.  Subjective Assessment - 06/10/18 1419    Currently in Pain?  Other (Comment)   10/10 phantom pain this date      S: "I can become verbally aggressive, I have been my whole life"   O: Stress management group completed to use as productive coping strategy, to help mitigate maladaptive coping to integrate in functional BADL/IADL. Education given on the definition of stress and its cognitive, behavioral, emotional, and physical effects on the body. Stress symptom checklist completed to raise insight on current symptoms felt with stress. Stress management tool worksheet discussed to educate on unhealthy vs healthy coping skills to manage stress to improve community integration. Coping strategies taught include: relaxation based- deep breathing, counting to 10, taking a 1 minute vacation, acceptance, stress balls, relaxation audio/video, visual/mental imagery. Positive mental attitude- gratitude, acceptance, cognitive reframing, positive self talk, anger management. Not all education completed this date, will be continued next date.  A: Pt presents to group with flat affect, engaged and participatory this session. 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 He shares this pain is sudden in onset and not common for him. He spoke to  PHP NP for follow up. Pt shares he often suppresses his stress and acts out verbally, if not physically. He shares interest in attempting some relaxation strategies shared, but states since his amputation had not been able to find the right healthy coping skills to use. Continued education will be given for pt to choose preferred coping mechanisms next treatment date.  P: Pt provided with education on stress management activities. OT will continue to follow up  with activities learned for successful implementation into daily life. OT will be x3 per week while pt in PHP.                     OT Education - 06/10/18 1419    Education Details  education given on stress management skills    Person(s) Educated  Patient    Methods  Explanation;Handout    Comprehension  Verbalized understanding       OT Short Term Goals - 05/23/18 1236      OT SHORT TERM GOAL #1   Title  Pt will be educated on strategies to improve psychosocial skills needed to participate fully in all daily, work, and leisure activities    Time  4    Period  Weeks    Status  On-going    Target Date  06/19/18      OT SHORT TERM GOAL #2   Title  Pt will apply psychosocial skills and coping mechanisms to daily activities in order to function independently and reintegrate into community    Time  4    Period  Weeks    Status  On-going    Target Date  06/19/18      OT SHORT TERM GOAL #3   Title  Pt will recall and/or apply 1-3 sleep hygiene strategies to improve BADL functioning prior to reintegrating into community    Time  4    Period  Weeks    Status  On-going    Target Date  06/19/18      OT SHORT TERM GOAL #4   Title  Pt will engage in goal setting to improve functional BADL/IADL routine upon reintegrating into community    Time  4    Period  Weeks    Status  On-going    Target Date  06/19/18      OT SHORT TERM GOAL #5   Title  Pt will create and/or implement functional routine to improve successful engagement in daily tasks prior to reintegrating into community    Time  4    Period  Weeks    Status  On-going    Target Date  06/19/18               Plan - 06/10/18 1420    OT Occupational Profile and History  Detailed Assessment- Review of Records and additional review of physical, cognitive, psychosocial history related to current functional performance    Occupational performance deficits (Please refer to evaluation for details):   ADL's;IADL's;Rest and Sleep;Work;Education;Leisure;Social Participation    Body Structure / Function / Physical Skills  ADL    Psychosocial Skills  Coping Strategies;Habits;Routines and Behaviors;Environmental  Adaptations;Interpersonal Interaction       Patient will benefit from skilled therapeutic intervention in order to improve the following deficits and impairments:  Body Structure / Function / Physical Skills, Psychosocial Skills  Visit Diagnosis: MDD (major depressive disorder), recurrent severe, without psychosis (HCC)  PTSD (post-traumatic stress disorder)  Generalized anxiety disorder  Difficulty coping  Problem List Patient Active Problem List   Diagnosis Date Noted  . PTSD (post-traumatic stress disorder) 04/15/2017  . Major depressive disorder, recurrent episode, moderate (HCC) 04/30/2016  . Generalized anxiety disorder 04/30/2016   Dalphine Handing, MSOT, OTR/L Behavioral Health OT/ Acute Relief OT PHP Office: [phone]  Dalphine Handing 06/10/2018, 2:21 PM  [facility] HOSPITALIZATION PROGRAM [address] [place], [place], [address] Phone: [phone]   Fax:  [phone]  Name: Mark [name] MRN: [id number] Date of Birth: [birth year]

## 2018-06-10 NOTE — Progress Notes (Signed)
Virtual Visit via Video Note  I connected with Mark [name] on 06/10/18 at  5:00 PM EDT by a video enabled telemedicine application and verified that I am speaking with the correct person using two identifiers.   I discussed the limitations of evaluation and management by telemedicine and the availability of in person appointments. The patient expressed understanding and agreed to proceed.  History of Present Illness: Pt was referred by [facility] community care for depression and ptsd.     Observations/Objective: Pt presents  via webex depressed during the veteran's group.    Summary of Progress:  Pt participated in a discussion on being supportive with each other, family members, and peers. 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 Patient was encouraged to continue to support each other and ask for help when needed.      Follow Up Instructions: Will continue to see patient individually upon completion of PHP Will continue to see patient weekly in veteran's group     I provided 60 minutes of non-face-to-face time during this encounter.   Virginie Josten S, LCAS

## 2018-06-11 ENCOUNTER — Encounter (HOSPITAL_COMMUNITY): Payer: Self-pay | Admitting: Family

## 2018-06-11 ENCOUNTER — Other Ambulatory Visit (HOSPITAL_COMMUNITY): Payer: No Typology Code available for payment source | Admitting: Licensed Clinical Social Worker

## 2018-06-11 ENCOUNTER — Other Ambulatory Visit: Payer: Self-pay

## 2018-06-11 ENCOUNTER — Encounter (HOSPITAL_COMMUNITY): Payer: Self-pay

## 2018-06-11 DIAGNOSIS — I10 Essential (primary) hypertension: Secondary | ICD-10-CM | POA: Diagnosis not present

## 2018-06-11 DIAGNOSIS — F4310 Post-traumatic stress disorder, unspecified: Secondary | ICD-10-CM

## 2018-06-11 DIAGNOSIS — Z89611 Acquired absence of right leg above knee: Secondary | ICD-10-CM | POA: Diagnosis not present

## 2018-06-11 DIAGNOSIS — E785 Hyperlipidemia, unspecified: Secondary | ICD-10-CM | POA: Diagnosis not present

## 2018-06-11 DIAGNOSIS — F332 Major depressive disorder, recurrent severe without psychotic features: Secondary | ICD-10-CM

## 2018-06-11 DIAGNOSIS — F411 Generalized anxiety disorder: Secondary | ICD-10-CM | POA: Diagnosis not present

## 2018-06-11 DIAGNOSIS — E119 Type 2 diabetes mellitus without complications: Secondary | ICD-10-CM | POA: Diagnosis not present

## 2018-06-11 DIAGNOSIS — Z794 Long term (current) use of insulin: Secondary | ICD-10-CM | POA: Diagnosis not present

## 2018-06-11 DIAGNOSIS — Z79899 Other long term (current) drug therapy: Secondary | ICD-10-CM | POA: Diagnosis not present

## 2018-06-11 NOTE — Progress Notes (Signed)
  [facility] Behavioral Health Partial Hospitalization  Outpatient Program Discharge Summary  Mark [name] [id number]  Admission date: 05/23/2018 Discharge date: 06/13/2018  Reason for admission: Major depressive disorder  Per assessment note:Mark [name]  is a 58 y.o. African American male presents with chroinc depression.  Patient reports he feels his depression has been made worse related to social isolation related to the" stay at home" orders by COVID-19.  Reported history of depression and anxiety related to PTSD.   Mark [name] reports the passing of his mother in 2018 was when he first noticed his depression.  Stated marital issues became more apparent and is now recently separated.  Reports right leg amputation in addition to extensive medical history has caused feelings of self doubt and guilt.  [facility].  Patient reports he is prescribed Wellbutrin 300, Minipress 2 mg and trazodone 50.  Reported passive suicidal ideations however denies intent and plan reported "I feel like I am just existing."  Reports he is currently followed by a therapist and has recently initiated weekly therapy sessions.  Started patient was enrolled in partial psychiatric program on 05/23/18.  Chemical Use History: Denied  Family of Origin Issues: Throughout his admission into partial hospitalization programming patient reported " building relationships" between he and his daughter.  Stated he is mindful that she also struggles with depression and anxiety.  Continues to report guilt surrounding his separation from his wife.  Progress in Program Toward Treatment Goals: Ongoing, Dahani attended and participated with daily group session with active and engaged participation.  As reported by treatment team patient is very insightful he continues to have mood dependent thinking but pleasant.  Patient to continue to follow-up with therapy sessions weekly.  We  will plan to transition [facility]to Dr[facility] Lolly Mustache.  Denying any medication refills at this time.   Progress (rationale): Keep follow-up appointment with therapist [name], psychiatrist Arfeen and [facility]  Take all medications as prescribed. Keep all follow-up appointments as scheduled.  Do not consume alcohol or use illegal drugs while on prescription medications. Report any adverse effects from your medications to your primary care provider promptly.  In the event of recurrent symptoms or worsening symptoms, call 911, a crisis hotline, or go to the nearest emergency department for evaluation.   [name], NP 06/11/2018

## 2018-06-11 NOTE — Progress Notes (Signed)
Virtual Visit via Telephone Note  I connected with Mark [name] on 06/11/18 at  9:00 AM EDT by telephone and verified that I am speaking with the correct person using two identifiers.   I discussed the limitations, risks, security and privacy concerns of performing an evaluation and management service by telephone and the availability of in person appointments. I also discussed with the patient that there may be a patient responsible charge related to this service. The patient expressed understanding and agreed to proceed.   I discussed the assessment and treatment plan with the patient. The patient was provided an opportunity to ask questions and all were answered. The patient agreed with the plan and demonstrated an understanding of the instructions.   The patient was advised to call back or seek an in-person evaluation if the symptoms worsen or if the condition fails to improve as anticipated.  I provided 15 minutes of non-face-to-face time during this encounter.   Mark [name] , NP    BH MD/PA/NP OP Progress Note  06/11/2018 7:15 AM Mark [name]  MRN:  [id number]  Evaluation: Mark [name] reported rough night and late start due to "phantom pain."  Reportedly has been up all night as he has not been able to get rest due to weather.  Reports mood irritability.  Denying suicidal or homicidal ideations.  Denies auditory or visual hallucinations.  Patient scheduled to discharge 06/14/2018.  We will plan to follow-up with his therapist weekly virtually.  Reported groups have been helpful with unresolved anger issues related related to life stressors.  Patient reported he has plans to attend daily group sessions in about 30 to 40 minutes.  Denied any medication refills at this time.  Support encouragement reassurance was provided.  Visit Diagnosis:    ICD-10-CM   1. MDD (major depressive disorder), recurrent severe, without psychosis (HCC) F33.2   2. Generalized anxiety disorder F41.1     Past  Psychiatric History:  Past Medical History:  Past Medical History:  Diagnosis Date  . Above knee amputation status, right   . Anxiety   . Depression   . Diabetes mellitus, type II (HCC)   . Hyperlipemia   . Hypertension   . PTSD (post-traumatic stress disorder)   . Shoulder pain     Past Surgical History:  Procedure Laterality Date  . HERNIA REPAIR    . LEG AMPUTATION Right   . parenatal cyst      Family Psychiatric History:  Family History:  Family History  Problem Relation Age of Onset  . Aneurysm Mother   . Leukemia Father   . Dementia Maternal Aunt   . Seizures Paternal Uncle     Social History:  Social History   Socioeconomic History  . Marital status: Married    Spouse name: Not on file  . Number of children: Not on file  . Years of education: Not on file  . Highest education level: Not on file  Occupational History  . Not on file  Social Needs  . Financial resource strain: Not very hard  . Food insecurity:    Worry: Never true    Inability: Never true  . Transportation needs:    Medical: No    Non-medical: No  Tobacco Use  . Smoking status: Current Every Day Smoker    Packs/day: 1.50    Years: 35.00    Pack years: 52.50    Types: Cigarettes  . Smokeless tobacco: Never Used  . Tobacco comment: Had quit for 3 years  then restarted and back up to 1 pk a day.  Substance and Sexual Activity  . Alcohol use: Yes    Alcohol/week: 4.0 standard drinks    Types: 2 Cans of beer, 2 Shots of liquor per week    Comment: Reports light drinking weekly.  . Drug use: Yes    Frequency: 14.0 times per week    Types: Marijuana    Comment: Uses 1-2 times a day per report for his anxiety  . Sexual activity: Not Currently  Lifestyle  . Physical activity:    Days per week: 2 days    Minutes per session: 20 min  . Stress: Very much  Relationships  . Social connections:    Talks on phone: More than three times a week    Gets together: Never    Attends religious  service: More than 4 times per year    Active member of club or organization: Yes    Attends meetings of clubs or organizations: More than 4 times per year    Relationship status: Separated  Other Topics Concern  . Not on file  Social History Narrative  . Not on file    Allergies:  Allergies  Allergen Reactions  . Ozempic (0.25 Or 0.5 Mg-Dose) [Semaglutide(0.25 Or 0.5mg -Dos)] Other (See Comments)    Caused Pancreatitis  . Lyrica [Pregabalin] Rash    Metabolic Disorder Labs: No results found for: HGBA1C, MPG No results found for: PROLACTIN No results found for: CHOL, TRIG, HDL, CHOLHDL, VLDL, LDLCALC No results found for: TSH  Therapeutic Level Labs: No results found for: LITHIUM No results found for: VALPROATE No components found for:  CBMZ  Current Medications: Current Outpatient Medications  Medication Sig Dispense Refill  . atorvastatin (LIPITOR) 80 MG tablet Take 80 mg by mouth daily.    Marland Kitchen buPROPion (WELLBUTRIN XL) 300 MG 24 hr tablet Take 1 tablet (300 mg total) by mouth daily. 90 tablet 0  . cyclobenzaprine (FLEXERIL) 10 MG tablet Take 10 mg by mouth 2 (two) times daily as needed for muscle spasms.    . hydrOXYzine (ATARAX/VISTARIL) 25 MG tablet Take 25 mg by mouth 2 (two) times daily as needed. Takes 1-2 tablets twice a day as needed for anxiety    . insulin aspart protamine- aspart (NOVOLOG MIX 70/30) (70-30) 100 UNIT/ML injection Inject into the skin.    . Insulin Glargine (LANTUS [place]) Inject into the skin.    Marland Kitchen lamoTRIgine (LAMICTAL) 100 MG tablet Take 1 tablet (100 mg total) by mouth daily. 90 tablet 0  . lisinopril (PRINIVIL,ZESTRIL) 5 MG tablet Take 5 mg by mouth daily.    . metFORMIN (GLUCOPHAGE) 1000 MG tablet Take 1,000 mg by mouth 2 (two) times daily with a meal.    . oxyCODONE-acetaminophen (PERCOCET) 5-325 MG tablet Take by mouth every 4 (four) hours as needed for severe pain.    . prazosin (MINIPRESS) 2 MG capsule Take 1 capsule (2 mg total) by mouth at  bedtime. 90 capsule 0  . sildenafil (VIAGRA) 100 MG tablet Take 100 mg by mouth daily as needed for erectile dysfunction.    . traZODone (DESYREL) 50 MG tablet Take 50 mg by mouth at bedtime.     No current facility-administered medications for this visit.      Musculoskeletal: Strength & Muscle Tone: [name]/A Gait & Station: [name]/A Patient leans: [name]/A  Psychiatric Specialty Exam: ROS  There were no vitals taken for this visit.There is no height or weight on file to calculate BMI.  General Appearance: NA  Eye Contact:  NA  Speech:  Clear and Coherent  Volume:  Normal  Mood:  Irritable  Affect:  Congruent  Thought Process:  Coherent  Orientation:  Full (Time, Place, and Person)  Thought Content: Logical   Suicidal Thoughts:  No  Homicidal Thoughts:  No  Memory:  Immediate;   Fair Recent;   Fair Remote;   Fair  Judgement:  Fair  Insight:  Present  Psychomotor Activity:  NA  Concentration:  Concentration: Fair  Recall:  [employer] of Knowledge: Fair  Language: Good  Akathisia:  No  Handed:  Right  AIMS (if indicated):   Assets:  Desire for Improvement Resilience Social Support  ADL's:  Intact  Cognition: WNL  Sleep:  Poor   Screenings: GAD-7     Counselor from 06/03/2017 in BEHAVIORAL HEALTH OUTPATIENT THERAPY [place]  Total GAD-7 Score  12    PHQ2-9     Counselor from 06/05/2018 in BEHAVIORAL HEALTH PARTIAL HOSPITALIZATION PROGRAM Counselor from 05/22/2018 in BEHAVIORAL HEALTH PARTIAL HOSPITALIZATION PROGRAM Counselor from 11/27/2017 in BEHAVIORAL HEALTH OUTPATIENT THERAPY [place] Counselor from 06/03/2017 in BEHAVIORAL HEALTH OUTPATIENT THERAPY [place]  PHQ-2 Total Score  PHQ-9 Total Score  Assessment and Plan:  Continue partial hospitalization program Continue taking medications as directed Patient scheduled discharge on 5/23 Keep follow-up appointment with [facility]   Treatment plan was reviewed and agreed upon by  NP T.  inpatient Tomi [place] need for continued group services  Mark Rack, NP 06/11/2018, 7:15 AM

## 2018-06-12 ENCOUNTER — Ambulatory Visit (HOSPITAL_COMMUNITY): Payer: Medicare HMO | Admitting: Licensed Clinical Social Worker

## 2018-06-12 ENCOUNTER — Other Ambulatory Visit (HOSPITAL_COMMUNITY): Payer: No Typology Code available for payment source | Admitting: Licensed Clinical Social Worker

## 2018-06-12 ENCOUNTER — Encounter (HOSPITAL_COMMUNITY): Payer: Self-pay

## 2018-06-12 ENCOUNTER — Other Ambulatory Visit: Payer: Self-pay

## 2018-06-12 ENCOUNTER — Ambulatory Visit (HOSPITAL_COMMUNITY): Payer: Medicare HMO

## 2018-06-12 DIAGNOSIS — F411 Generalized anxiety disorder: Secondary | ICD-10-CM

## 2018-06-12 DIAGNOSIS — Z794 Long term (current) use of insulin: Secondary | ICD-10-CM | POA: Diagnosis not present

## 2018-06-12 DIAGNOSIS — F4310 Post-traumatic stress disorder, unspecified: Secondary | ICD-10-CM

## 2018-06-12 DIAGNOSIS — E785 Hyperlipidemia, unspecified: Secondary | ICD-10-CM | POA: Diagnosis not present

## 2018-06-12 DIAGNOSIS — Z79899 Other long term (current) drug therapy: Secondary | ICD-10-CM | POA: Diagnosis not present

## 2018-06-12 DIAGNOSIS — F332 Major depressive disorder, recurrent severe without psychotic features: Secondary | ICD-10-CM | POA: Diagnosis not present

## 2018-06-12 DIAGNOSIS — E119 Type 2 diabetes mellitus without complications: Secondary | ICD-10-CM | POA: Diagnosis not present

## 2018-06-12 DIAGNOSIS — I10 Essential (primary) hypertension: Secondary | ICD-10-CM | POA: Diagnosis not present

## 2018-06-12 DIAGNOSIS — Z89611 Acquired absence of right leg above knee: Secondary | ICD-10-CM | POA: Diagnosis not present

## 2018-06-12 NOTE — Progress Notes (Signed)
Met with patient through virtual WebEx session today as he presented with sad affect, depressed mood and admitted he was becoming a little apprehensive about ending PHP on 06/14/18 but that he knew he "could not continue PHP forever".  Patient stated plan to return to seeing Binnie Rail for weekly veterans group and for weekly individual sessions.  States he is glad he will be seeing Dr. Adele Schilder again to manage his psychiatric medications and not the [place].  Patient denies any current suicidal or homicidal ideations, no auditory or visual hallucinations and no plan or intent to want to harm self or others at this time.  Patient stated he has not had suicidal thoughts in the past few weeks and would not harm himself as his faith helps keep him from ever wanting to carry out past thoughts.  Patient reported for this first time in a long time he has been experiencing phantom pain in his stump this week.  States it was worse on Monday and Tuesday and used Gabapentin and Flexeril along with Ben-gay type cream from the [place] to help sooth the discomfort.  Patient stated he is still having this occur some but comes and goes and is not hurting at this time or all the time.  Patient rated his current level of depression a 6-7 and anxiety a 5 on a scale of 0-10 with 0 being none and 10 the worst he could manage but acknowledged he felt this was because he has gotten so use to getting up for PHP and going to bed early that he is worried now he will not continue with a better routine.  Patient stated plan to use his learned coping and distracting skills to help when he feels anxious or more depressed and he agreed to practice these and to try to keep a list of his 5 that work the best so when he uses them, if one is not helping in that situation he can try another one that has worked in the past.  Patient reported no current problems with medications and sleeping and eating better.  Reports now sleeping at least 7 hours a night and  using his c-pap machine more consistently.  States even though he still often has a lack of energy he is getting more done each day.  Patient scored a 14 on his PHQ9 depression screening, down from 24 from 05/22/18 and report even though he is still spending a lot of time at home and looks forward to more social interactions when COVID-19 restrictions lessen, he is doing better and more positive about his ability to manage this time and does not have any thoughts now of wanting to harm self.  Patient encouraged to follow through with attending virtual [place] groups and individual sessions with Binnie Rail and to call if any problems with medications or worsening thoughts.  Patient reported he is okay with and prepared for transitions out of PHP on 06/12/18 at this time.

## 2018-06-12 NOTE — Progress Notes (Addendum)
Spiritual care group 06/11/2018 11:00-12:00  Facilitated by Simone Curia, MDiv, BCC   Group met via web-ex in response to COVID-19 precautions Group focused on topic of "self-care"  Patients engaged in facilitated discussion about topic.  Explored quotes related to self care and chose one which they agreed with and one which they disliked.  Engaged in discussion around quote choices and their experience / understanding of care for themselves.   Mark [name] was present throughout group.  Engaged in group activity and discussion.  Worked to encourage group mebmers to engage and Expressed some disappointment at level of engagement of peers.  Mark [name] noted that he is reorienting to thinking of self care as essential rather than selfish.  NOted he has had a narrative of "God first, others second, me third"  Actively explored this and worked to reframe this narrative.

## 2018-06-13 ENCOUNTER — Encounter (HOSPITAL_COMMUNITY): Payer: Self-pay

## 2018-06-13 ENCOUNTER — Other Ambulatory Visit (HOSPITAL_COMMUNITY): Payer: No Typology Code available for payment source | Admitting: Licensed Clinical Social Worker

## 2018-06-13 ENCOUNTER — Other Ambulatory Visit (HOSPITAL_COMMUNITY): Payer: No Typology Code available for payment source | Admitting: Occupational Therapy

## 2018-06-13 ENCOUNTER — Other Ambulatory Visit: Payer: Self-pay

## 2018-06-13 DIAGNOSIS — Z79899 Other long term (current) drug therapy: Secondary | ICD-10-CM | POA: Diagnosis not present

## 2018-06-13 DIAGNOSIS — F411 Generalized anxiety disorder: Secondary | ICD-10-CM

## 2018-06-13 DIAGNOSIS — F4310 Post-traumatic stress disorder, unspecified: Secondary | ICD-10-CM | POA: Diagnosis not present

## 2018-06-13 DIAGNOSIS — E119 Type 2 diabetes mellitus without complications: Secondary | ICD-10-CM | POA: Diagnosis not present

## 2018-06-13 DIAGNOSIS — R4589 Other symptoms and signs involving emotional state: Secondary | ICD-10-CM

## 2018-06-13 DIAGNOSIS — Z89611 Acquired absence of right leg above knee: Secondary | ICD-10-CM | POA: Diagnosis not present

## 2018-06-13 DIAGNOSIS — I10 Essential (primary) hypertension: Secondary | ICD-10-CM | POA: Diagnosis not present

## 2018-06-13 DIAGNOSIS — F332 Major depressive disorder, recurrent severe without psychotic features: Secondary | ICD-10-CM

## 2018-06-13 DIAGNOSIS — Z794 Long term (current) use of insulin: Secondary | ICD-10-CM | POA: Diagnosis not present

## 2018-06-13 DIAGNOSIS — E785 Hyperlipidemia, unspecified: Secondary | ICD-10-CM | POA: Diagnosis not present

## 2018-06-13 NOTE — Therapy (Signed)
[place], [place], [address] Phone: [phone]   Fax:  [phone]  Occupational Therapy Treatment  Patient Details  Name: Mark [name] MRN: [id number] Date of Birth: 1960/05/15 Referring Provider (OT): Ricky Ala, NP  Virtual Visit via Video Note  I connected with Mark [name] on 06/13/18 at  8:00 AM EDT by a video enabled telemedicine application and verified that I am speaking with the correct person using two identifiers.   I discussed the limitations of evaluation and management by telemedicine and the availability of in person appointments. The patient expressed understanding and agreed to proceed.  I discussed the assessment and treatment plan with the patient. The patient was provided an opportunity to ask questions and all were answered. The patient agreed with the plan and demonstrated an understanding of the instructions.   The patient was advised to call back or seek an in-person evaluation if the symptoms worsen or if the condition fails to improve as anticipated.  I provided 60 minutes of non-face-to-face time during this encounter.  Zenovia Jarred, MSOT, OTR/L Behavioral Health OT/ Acute Relief OT PHP Office: [phone]  Zenovia Jarred, [place]    Encounter Date: 06/13/2018  OT End of Session - 06/13/18 1422    Visit Number  9    Number of Visits  16    Date for OT Re-Evaluation  06/19/18    Authorization Type  [employer] medicare    OT Start Time  1100    OT Stop Time  1200    OT Time Calculation (min)  60 min    Activity Tolerance  Patient tolerated treatment well    Behavior During Therapy  [facility] for tasks assessed/performed       Past Medical History:  Diagnosis Date  . Above knee amputation status, right   . Anxiety   . Depression   . Diabetes mellitus, type II ([place])   . Hyperlipemia   . Hypertension   . PTSD (post-traumatic stress disorder)   . Shoulder pain      Past Surgical History:  Procedure Laterality Date  . HERNIA REPAIR    . LEG AMPUTATION Right   . parenatal cyst      There were no vitals filed for this visit.  Subjective Assessment - 06/13/18 1421    Currently in Pain?  Other (Comment)   chronic pain      S: "I have learned some of this in my college studies, but I feel at a better place to receive this information"   O: Education provided on health and wellness in the physical and mental aspect and how those connections positively affect the body/mind and the ability to appropriately engage in BADL/IADL/community. Definitions on mental and physical health given. Group discussion given on ways to facilitate positive mental health and physical health per pt current level. Self-esteem and awareness of emotions discussed to help further facilitate well rounded health and wellness. Pt brainstormed ways to increase physical activity per current level and comforts.  A: Pt presents with appropriate affect, engaged and participatory throughout session. Pt shares he wants to improve his environmental and social wellness. He shares how he had learned about mental and physical connection in school, and at one point had to drop that class because he felt as if he was not ready to receive the level of information and was internalizing it. He shares he feels at a better place this date to receive the info. He  shares how he thrives in social situations and positive environments, but has difficulty obtaining these on a daily basis and is anxious to d/c from [facility] this date. He also shares how his chronic pain, phantom pain, and amputation of the leg have affected his mental health.  P: OT group will be x3 per week while pt in PHP                      OT Education - 06/13/18 1421    Education Details  education given on health/wellness and occupational balance    Person(s) Educated  Patient    Methods  Explanation;Handout     Comprehension  Verbalized understanding       OT Short Term Goals - 06/13/18 1422      OT SHORT TERM GOAL #1   Title  Pt will be educated on strategies to improve psychosocial skills needed to participate fully in all daily, work, and leisure activities    Time  4    Period  Weeks    Status  Achieved    Target Date  06/19/18      OT SHORT TERM GOAL #2   Title  Pt will apply psychosocial skills and coping mechanisms to daily activities in order to function independently and reintegrate into community    Time  4    Period  Weeks    Status  Achieved    Target Date  06/19/18      OT SHORT TERM GOAL #3   Title  Pt will recall and/or apply 1-3 sleep hygiene strategies to improve BADL functioning prior to reintegrating into community    Time  4    Period  Weeks    Status  Achieved    Target Date  06/19/18      OT SHORT TERM GOAL #4   Title  Pt will engage in goal setting to improve functional BADL/IADL routine upon reintegrating into community    Time  4    Period  Weeks    Status  Achieved    Target Date  06/19/18      OT SHORT TERM GOAL #5   Title  Pt will create and/or implement functional routine to improve successful engagement in daily tasks prior to reintegrating into community    Time  4    Period  Weeks    Status  Achieved    Target Date  06/19/18               Plan - 06/13/18 1422    Occupational performance deficits (Please refer to evaluation for details):  ADL's;IADL's;Rest and Sleep;Work;Education;Leisure;Social Participation    Body Structure / Function / Physical Skills  ADL    Psychosocial Skills  Coping Strategies;Habits;Routines and Behaviors;Environmental  Adaptations;Interpersonal Interaction       Patient will benefit from skilled therapeutic intervention in order to improve the following deficits and impairments:  Body Structure / Function / Physical Skills, Psychosocial Skills  Visit Diagnosis: MDD (major depressive disorder), recurrent  severe, without psychosis ([place])  PTSD (post-traumatic stress disorder)  Generalized anxiety disorder  Difficulty coping    Problem List Patient Active Problem List   Diagnosis Date Noted  . PTSD (post-traumatic stress disorder) 04/15/2017  . Major depressive disorder, recurrent episode, moderate ([place]) 04/30/2016  . Generalized anxiety disorder 04/30/2016    OCCUPATIONAL THERAPY DISCHARGE SUMMARY  Visits from Start of Care: 9  Current functional level related to goals / functional outcomes: Independent,  pt has made significant progress and is stepping down to individual counseling level of care   Remaining deficits: Continuing to be consistent in skills learned   Education / Equipment: Education given on psychosocial skills and coping mechanisms as it applies to [employer] re-engagement and routine for productive re-integration into community. Plan: Patient agrees to discharge.  Patient goals were met. Patient is being discharged due to being pleased with the current functional level.  ?????         Zenovia Jarred, MSOT, OTR/L Behavioral Health OT/ Acute Relief OT PHP Office: [place] 06/13/2018, 2:25 PM  [facility] HOSPITALIZATION PROGRAM [place], [place], [address] Phone: [phone]   Fax:  [phone]  Name: Mark [name] MRN: [id number] Date of Birth: Mar 19, 1960

## 2018-06-16 ENCOUNTER — Other Ambulatory Visit (HOSPITAL_COMMUNITY): Payer: Medicare HMO

## 2018-06-16 NOTE — Psych (Signed)
Virtual Visit via Video Note  I connected with Mark [name] on 05/27/18 at  9:00 AM EDT by a video enabled telemedicine application and verified that I am speaking with the correct person using two identifiers.   I discussed the limitations of evaluation and management by telemedicine and the availability of in person appointments. The patient expressed understanding and agreed to proceed.   I discussed the assessment and treatment plan with the patient. The patient was provided an opportunity to ask questions and all were answered. The patient agreed with the plan and demonstrated an understanding of the instructions.   The patient was advised to call back or seek an in-person evaluation if the symptoms worsen or if the condition fails to improve as anticipated.  Pt was provided 240 minutes of non-face-to-face time during this encounter.   Mark Guiles, LCSW    [facility] PHP THERAPIST PROGRESS NOTE  Mark [name] [id number]  Session Time: 9:00 - 10:00  Participation Level: Active  Behavioral Response: CasualAlertDepressed  Type of Therapy: Group Therapy  Treatment Goals addressed: Coping  Interventions: CBT, DBT, Supportive and Reframing  Summary: Clinician led check-in regarding current stressors and situation, and review of patient completed daily inventory. Clinician utilized active listening and empathetic response and validated patient emotions. Clinician facilitated processing group on pertinent issues  Therapist Response: Mark [name] is a 58 y.o. male who presents with depression and anxiety symptoms.  Patient arrived within time allowed and reports that he is"just not feeling it today."Patient rates his mood at a 6 on a scale of 1-10 with 10 being great. Pt reports he did not sleep well or take his morning shower which has started his morning off poorly. Pt reports yesterday he "had a good day amidst the bad - which I haven't said in a month."  Pt struggles with  judgment. Pt engaged in discussion.       Session Time: 10:00 -11:00  Participation Level: Active  Behavioral Response: CasualAlertDepressed  Type of Therapy: Group Therapy, psychoeducation, psychotherapy  Treatment Goals addressed: Coping  Interventions: CBT, DBT, Solution Focused, Supportive and Reframing  Summary:  Cln led discussion on trust in relationships. Group shared ways in which trust is a struggle for them and how it affects their relationships.   Therapist Response: Pt reports "I don't trust too many people" and lists a fraternity brother as the person he trusts most. Pt states "I'm just not messing with people like that right now" and that everyone he doesn't trust "deserves" it. Pt struggles with rigid thinking.       Session Time: 11:00 - 12:00  Participation Level:Active  Behavioral Response:CasualAlertDepressed  Type of Therapy: Group Therapy, OT  Treatment Goals addressed: Coping  Interventions:Psychosocial skills training, Supportive,   Summary:Occupational Therapy group  Therapist Response: Patient engaged in group. See OT note.       Session Time: 12:00- 1:00  Participation Level: Active  Behavioral Response: CasualAlertDepressed  Type of Therapy: Group Therapy, Psychoeducation; Psychotherapy  Treatment Goals addressed: Coping  Interventions: CBT; Solution focused; Supportive; Reframing  Summary: 12:00 - 12:50 Cln led boundary workshop in which group members shared boundary issues they are dealing with. Cln coached pts on good boundary tenets and group members provided feedback and support to each other while working through the personal examples.  12:50 -1:00 Clinician led check-out. Clinician assessed for immediate needs, medication compliance and efficacy, and safety concerns   Therapist Response: Patient reports understanding of how to set a boundary and with other  group members examples, he  does seem to. Pt is not as successful with his own example, however seems to have thought about yesterday's discussion and is more on point.  At checkout, pt rates his mood at a 7.5 on a scale of 1-10 with 10 being great. Patient reports afternoon plans working on a paper and doing laundry. Patient demonstrates some progress as evidenced by recognizing he can have good days when things are not ideal. Pt denies SI/HI at end of group.       Suicidal/Homicidal: Nowithout intent/plan   Plan: Pt will continue in PHP while working to decrease depression and anxiety symptoms and increase ability to manage symptoms in a healthy manner.   Diagnosis: MDD (major depressive disorder), recurrent severe, without psychosis (HCC) [F33.2]    1. MDD (major depressive disorder), recurrent severe, without psychosis (HCC)   2. PTSD (post-traumatic stress disorder)   3. Generalized anxiety disorder       Mark GuilesJenny Toneisha [name], [place] 06/16/2018

## 2018-06-16 NOTE — Psych (Signed)
Virtual Visit via Video Note  I connected with Mark [name] on 05/30/18 at  9:00 AM EDT by a video enabled telemedicine application and verified that I am speaking wiEugenio [name] the correct person using two identifiers.   I discussed the limitations of evaluation and management by telemedicine and the availability of in person appointments. The patient expressed understanding and agreed to proceed.   I discussed the assessment and treatment plan with the patient. The patient was provided an opportunity to ask questions and all were answered. The patient agreed with the plan and demonstrated an understanding of the instructions.   The patient was advised to call back or seek an in-person evaluation if the symptoms worsen or if the condition fails to improve as anticipated.  Pt was provided 240 minutes of non-face-to-face time during this encounter.   Mark GuilesJenny Yarixa Lightcap, LCSW    [facility] PHP THERAPIST PROGRESS NOTE  Mark [name] [id number]  Session Time: 9:00 - 10:00  Participation Level: Active  Behavioral Response: CasualAlertDepressed  Type of Therapy: Group Therapy  Treatment Goals addressed: Coping  Interventions: CBT, DBT, Supportive and Reframing  Summary: Clinician led check-in regarding current stressors and situation, and review of patient completed daily inventory. Clinician utilized active listening and empathetic response and validated patient emotions. Clinician facilitated processing group on pertinent issues  Therapist Response: Mark [name] is a 58 y.o. male who presents with depression and anxiety symptoms.  Patient arrived within time allowed and reports that he isfeeling "tired."Patient rates his mood at a 3 on a scale of 1-10 with 10 being great. Pt reports he had a conflict with his son yesterday because his son is not listening to his mom. Pt reports also being frustrated with his wife because she is not setting boundaries, however puts more responsibility on son to be  respectful. Pt did share desire to "put hands" on his son, however son is in [place] and pt denies any intent to drive to [place]. Pt reports having racing thoughts last night which hindered his sleep and caused him to over sleep. Pt continues to struggle with anger. Pt engaged in discussion.       Session Time: 10:00 -11:00  Participation Level: Active  Behavioral Response: CasualAlertDepressed  Type of Therapy: Group Therapy, psychoeducation, psychotherapy  Treatment Goals addressed: Coping  Interventions: CBT, DBT, Solution Focused, Supportive and Reframing  Summary:  Cln led discussion on self esteem. Cln educated group on common sources of externalized self-esteem: other's opinions, body image/attractiveness, and role fulfillment/productivity. Group shared what struggles they have with self esteem and how they see it present. Cln suggested strategies to begin to address self esteem issues such as writing one thing that's at least okay about yourself every day, looking for exceptions to what you think is reality, and adding positivity into their social media.   Therapist Response: Pt states relating to self esteem being dependent on role fulfillment and speaks on topics of masculinity such as being the financial provider, being able to take care of himself physically, and having the answers. Pt states feeling overall he has a healthy self-esteem.       Session Time: 11:00 - 12:00  Participation Level:Active  Behavioral Response:CasualAlertDepressed  Type of Therapy: Group Therapy, OT  Treatment Goals addressed: Coping  Interventions:Psychosocial skills training, Supportive,   Summary:Occupational Therapy group  Therapist Response: Patient engaged in group. See OT note.       Session Time: 12:00- 1:00  Participation Level: Active  Behavioral Response: CasualAlertDepressed  Type  of Therapy: Group Therapy, Psychoeducation;  Psychotherapy  Treatment Goals addressed: Coping  Interventions: CBT; Solution focused; Supportive; Reframing  Summary: 12:00 - 12:50 Clinician continued topic of feelings. Cln utilized hand out "Myths about Emotions" and pt's discussed ways in which the myths felt true and how to challenge them. 12:50 -1:00 Clinician led check-out. Clinician assessed for immediate needs, medication compliance and efficacy, and safety concerns   Therapist Response: Pt engaged in discussion. Pt was able to identify ways to challenge these myths despite how it feels.   At checkout, pt rates his mood at a 6.5 on a scale of 1-10 with 10 being great. Patient reports afternoon plans of doing laundry and organize a bedroom. Pt states weekend plans of attending a business training, going to church, and cooking. Patient demonstrates some progress as evidenced by allowing mood to lift as day progressed. Pt denies SI/HI at end of group.       Suicidal/Homicidal: Nowithout intent/plan   Plan: Pt will continue in PHP while working to decrease depression and anxiety symptoms and increase ability to manage symptoms in a healthy manner.   Diagnosis: MDD (major depressive disorder), recurrent severe, without psychosis (HCC) [F33.2]    1. MDD (major depressive disorder), recurrent severe, without psychosis (HCC)   2. PTSD (post-traumatic stress disorder)   3. Generalized anxiety disorder       Mark [name], [place] 06/16/2018

## 2018-06-16 NOTE — Psych (Signed)
Virtual Visit via Video Note  I connected with Mark [name] on 05/29/18 at  9:00 AM EDT by a video enabled telemedicine application and verified that I am speaking with the correct person using two identifiers.   I discussed the limitations of evaluation and management by telemedicine and the availability of in person appointments. The patient expressed understanding and agreed to proceed.   I discussed the assessment and treatment plan with the patient. The patient was provided an opportunity to ask questions and all were answered. The patient agreed with the plan and demonstrated an understanding of the instructions.   The patient was advised to call back or seek an in-person evaluation if the symptoms worsen or if the condition fails to improve as anticipated.  Pt was provided 240 minutes of non-face-to-face time during this encounter.   Mark Guiles, LCSW    [facility] PHP THERAPIST PROGRESS NOTE  Mark [name] [id number]  Session Time: 9:00 - 10:00  Participation Level: Active  Behavioral Response: CasualAlertDepressed  Type of Therapy: Group Therapy  Treatment Goals addressed: Coping  Interventions: CBT, DBT, Supportive and Reframing  Summary: Clinician led check-in regarding current stressors and situation, and review of patient completed daily inventory. Clinician utilized active listening and empathetic response and validated patient emotions. Clinician facilitated processing group on pertinent issues  Therapist Response: Mark [name] is a 58 y.o. male who presents with depression and anxiety symptoms.  Patient arrived within time allowed and reports that he is"over it."Patient rates his mood at a 3.5 on a scale of 1-10 with 10 being great. Pt reports he did not sleep well again last night. Pt states he was restless last night, but didn't take the sleep medication because he did not want to be groggy this morning. Pt reports not wanting to come to group this morning "but I  know I need to." Pt reports a productive afternoon yesterday however became derailed when he saw the news which upset him. Pt struggles with moving past poor start to the morning. Pt engaged in discussion.       Session Time: 10:00 -11:00  Participation Level: Active  Behavioral Response: CasualAlertDepressed  Type of Therapy: Group Therapy, psychoeducation, psychotherapy  Treatment Goals addressed: Coping  Interventions: CBT, DBT, Solution Focused, Supportive and Reframing  Summary:  Cln led discussion on authenticity and how we hide or show ourselves and the affects of both. Group discussed how they interact with these concepts and how it plays out in their lives.   Therapist Response: Pt states he has no issue with being "who I am" however states he does not show himself to many people. Pt continues to use rhetoric of issues being other people's problems and him being justified in all of his reactions.       Session Time: 11:00 - 12:00  Participation Level:Active  Behavioral Response:CasualAlertDepressed  Type of Therapy: Group Therapy, OT  Treatment Goals addressed: Coping  Interventions:Psychosocial skills training, Supportive,   Summary:Occupational Therapy group  Therapist Response: Patient engaged in group. See OT note.       Session Time: 12:00- 1:00  Participation Level: Active  Behavioral Response: CasualAlertDepressed  Type of Therapy: Group Therapy, Psychoeducation; Psychotherapy  Treatment Goals addressed: Coping  Interventions: CBT; Solution focused; Supportive; Reframing  Summary: 12:00 - 12:50 Cln introduced topic of feelings. Cln educated pt's on the difference between feelings and the reaction to our feelings. Cln provided framework of which to view feelings and how meaning is given not innate.  12:50 -  1:00 Clinician led check-out. Clinician assessed for immediate needs, medication compliance and  efficacy, and safety concerns   Therapist Response: Patient reports understanding of topic however is not sure if he "buys it." Pt continues to state that other people "make him" act certain ways and that it is intentional.  At checkout, pt rates his mood at a 6.5 on a scale of 1-10 with 10 being great. Patient reports afternoon plans of doing school work and getting food. Patient demonstrates some progress as evidenced by taking in new information even with his reservations. Pt denies SI/HI at end of group.       Suicidal/Homicidal: Nowithout intent/plan   Plan: Pt will continue in PHP while working to decrease depression and anxiety symptoms and increase ability to manage symptoms in a healthy manner.   Diagnosis: MDD (major depressive disorder), recurrent severe, without psychosis (HCC) [F33.2]    1. MDD (major depressive disorder), recurrent severe, without psychosis (HCC)   2. Generalized anxiety disorder   3. PTSD (post-traumatic stress disorder)       Mark GuilesJenny Shardee Dieu, LCSW 06/16/2018

## 2018-06-16 NOTE — Psych (Signed)
Virtual Visit via Video Note  I connected with Mark [name] on 05/28/18 at  9:00 AM EDT by a video enabled telemedicine application and verified that I am speaking with the correct person using two identifiers.   I discussed the limitations of evaluation and management by telemedicine and the availability of in person appointments. The patient expressed understanding and agreed to proceed.   I discussed the assessment and treatment plan with the patient. The patient was provided an opportunity to ask questions and all were answered. The patient agreed with the plan and demonstrated an understanding of the instructions.   The patient was advised to call back or seek an in-person evaluation if the symptoms worsen or if the condition fails to improve as anticipated.  Pt was provided 240 minutes of non-face-to-face time during this encounter.   [name], LCSW    [facility] PHP THERAPIST PROGRESS NOTE  Shermon Edwardsen [id number]  Session Time: 9:00 - 10:00  Participation Level: Active  Behavioral Response: CasualAlertDepressed  Type of Therapy: Group Therapy  Treatment Goals addressed: Coping  Interventions: CBT, DBT, Supportive and Reframing  Summary: Clinician led check-in regarding current stressors and situation, and review of patient completed daily inventory. Clinician utilized active listening and empathetic response and validated patient emotions. Clinician facilitated processing group on pertinent issues  Therapist Response: Mordcha Burdett is a 58 y.o. male who presents with depression and anxiety symptoms.  Patient arrived within time allowed and reports that he isfeeling "sluggish."Patient rates his mood at a 4 on a scale of 1-10 with 10 being great. Pt reports he took his sleep medication last night and is having a difficult time waking up. Pt states he slept longer with the medication but does not feel more rested. 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 Pt struggles with control. Pt engaged in discussion.       Session Time: 10:00 -11:00  Participation Level:Active  Behavioral Response:CasualAlertDepressed  Type of Therapy: Group Therapy, psychoeducation, psychotherapy  Treatment Goals addressed: Coping  Interventions:CBT, DBT, Solution Focused, Supportive and Reframing  Summary:Cln led discussion on absolute thinking and the ways in which it can upset our perception.  Therapist Response: Patient states he understands the ways in which absolute thinking can cause issues, and states that his faith system is something that he needs to view rigidly. Cln challenges this thought, and pt states it is foundational for him.       Session Time: 11:00 -12:00  Participation Level:Active  Behavioral Response:CasualAlertDepressed  Type of Therapy: Group Therapy, psychotherapy  Treatment Goals addressed: Coping  Interventions:Strengths based, reframing, Supportive,   Summary:Spiritual Care group  Therapist Response: Patient engaged in group. See chaplain note.       Session Time: 12:00- 1:00  Participation Level:Active  Behavioral Response:CasualAlertDepressed  Type of Therapy: Group Therapy, Psychoeducation  Treatment Goals addressed: Coping  Interventions:relaxation training; Supportive; Reframing  Summary:12:00 - 12:50: Relaxation group: Cln led group focused on retraining the body's response to stress.  12:50 -1:00 Clinician led check-out. Clinician assessed for immediate needs, medication compliance and efficacy, and safety concerns  Therapist Response:Pt engaged in activity and discussion. At check-out, patient rates hismood at [facility] a scale of 1-10 with 10 being great. Patient reports afternoon plans of "same old, same old." Patient demonstrates some progress as evidenced by setting goals for outside of group.  Patient denies SI/HI/self-harm thoughts at the end of group.      Suicidal/Homicidal:  Nowithout intent/plan   Plan: Pt will continue in PHP while working to decrease depression and anxiety symptoms and increase ability to manage symptoms in a healthy manner.   Diagnosis: MDD (major depressive disorder), recurrent severe, without psychosis (HCC) [F33.2]    1. MDD (major depressive disorder), recurrent severe, without psychosis (HCC)   2. Generalized anxiety disorder   3. PTSD (post-traumatic stress disorder)       Donia GuilesJenny Charls Custer, LCSW 06/16/2018

## 2018-06-17 ENCOUNTER — Ambulatory Visit (HOSPITAL_COMMUNITY): Payer: Medicare HMO

## 2018-06-17 ENCOUNTER — Other Ambulatory Visit (HOSPITAL_COMMUNITY): Payer: Medicare HMO

## 2018-06-17 NOTE — Psych (Signed)
Virtual Visit via Video Note  I connected with Mark [name] on 06/06/18 at  9:00 AM EDT by a video enabled telemedicine application and verified that I am speaking with the correct person using two identifiers.   I discussed the limitations of evaluation and management by telemedicine and the availability of in person appointments. The patient expressed understanding and agreed to proceed.   I discussed the assessment and treatment plan with the patient. The patient was provided an opportunity to ask questions and all were answered. The patient agreed with the plan and demonstrated an understanding of the instructions.   The patient was advised to call back or seek an in-person evaluation if the symptoms worsen or if the condition fails to improve as anticipated.  Pt was provided 240 minutes of non-face-to-face time during this encounter.   [name], LCSW    [facility] PHP THERAPIST PROGRESS NOTE  Mark [name] [id number]  Session Time: 9:00 - 10:00  Participation Level: Active  Behavioral Response: CasualAlertDepressed  Type of Therapy: Group Therapy  Treatment Goals addressed: Coping  Interventions: CBT, DBT, Supportive and Reframing  Summary: Clinician led check-in regarding current stressors and situation, and review of patient completed daily inventory. Clinician utilized active listening and empathetic response and validated patient emotions. Clinician facilitated processing group on pertinent issues  Therapist Response: Mark [name] is a 58 y.o. male who presents with depression and anxiety symptoms.  Patient arrived within time allowed and reports that he isfeeling "high anxiety today."Patient rates his mood at a 5.5 on a scale of 1-10 with 10 being great. Pt reports he is anxious about his finances since his disability got cut. Pt reports "I know it will work out, because He got me, but I don't know how right now." Pt shares he is trying to take practical steps such as  sticking to a budget and canceling unnecessary services. Pt states he attended a church meeting virtually last night and finished another paper. Pt reports he did not sleep well because he was up until 2 AM finishing his paper. Pt is struggling to stay present. Pt engaged in discussion.       Session Time: 10:00 -11:00  Participation Level: Active  Behavioral Response: CasualAlertDepressed  Type of Therapy: Group Therapy, psychoeducation, psychotherapy  Treatment Goals addressed: Coping  Interventions: CBT, DBT, Solution Focused, Supportive and Reframing  Summary: Cln led discussion on implementation of distress tolerance skills. Cln made connection to pt's struggles with how skills could be helpful and brought awareness to things they have done that are skillfull. Group shared ways in which they have practiced the skills over this week intentionally.   Therapist Response: Pt reports school work and organizing have been two most effective distractors for him this week. Pt states continued struggle with applying skills to his interpersonal dynamics.       Session Time: 11:00 - 12:00  Participation Level:Active  Behavioral Response:CasualAlertDepressed  Type of Therapy: Group Therapy, OT  Treatment Goals addressed: Coping  Interventions:Psychosocial skills training, Supportive,   Summary:Occupational Therapy group  Therapist Response: Patient engaged in group. See OT note.       Session Time: 12:00- 1:00  Participation Level: Active  Behavioral Response: CasualAlertDepressed  Type of Therapy: Group Therapy, Psychoeducation; Psychotherapy  Treatment Goals addressed: Coping  Interventions: CBT; Solution focused; Supportive; Reframing  Summary: 12:00 - 12:50 Clinician introduced topic of cognitive distortions. Cln educated on what cognitive distortions are and how they affect [name]. Cln introduced "Catch, Challenge, Change" and  group began to review cognitive distortion handout and came up with examples to work on "catch." 12:50 -1:00 Clinician led check-out. Clinician assessed for immediate needs, medication compliance and efficacy, and safety concerns   Therapist Response: Pt engaged in discussion and is enthusiastic about topic. Pt struggles with some distortions such as mind reading and personalization however acknowledges the need to reframe.  At checkout, pt rates his mood at a 7 on a scale of 1-10 with 10 being great. Patient reports afternoon plans of cooking and doing more school work. Patient demonstrates some progress as evidenced by recognizing times when distraction skills would be useful. Pt denies SI/HI at end of group.       Suicidal/Homicidal: Nowithout intent/plan   Plan: Pt will continue in PHP while working to decrease depression and anxiety symptoms and increase ability to manage symptoms in a healthy manner.   Diagnosis: MDD (major depressive disorder), recurrent severe, without psychosis (HCC) [F33.2]    1. MDD (major depressive disorder), recurrent severe, without psychosis (HCC)   2. PTSD (post-traumatic stress disorder)   3. Generalized anxiety disorder       Donia GuilesJenny Abimbola Aki, LCSW 06/17/2018

## 2018-06-17 NOTE — Psych (Signed)
Virtual Visit via Video Note  I connected with Mark [name] on 06/04/18 at  9:00 AM EDT by a video enabled telemedicine application and verified that I am speaking with the correct person using two identifiers.   I discussed the limitations of evaluation and management by telemedicine and the availability of in person appointments. The patient expressed understanding and agreed to proceed.   I discussed the assessment and treatment plan with the patient. The patient was provided an opportunity to ask questions and all were answered. The patient agreed with the plan and demonstrated an understanding of the instructions.   The patient was advised to call back or seek an in-person evaluation if the symptoms worsen or if the condition fails to improve as anticipated.  Pt was provided 240 minutes of non-face-to-face time during this encounter.   [name], LCSW    [facility] PHP THERAPIST PROGRESS NOTE  Mark [name] [id number]  Session Time: 9:00 - 10:00  Participation Level: Active  Behavioral Response: CasualAlertDepressed  Type of Therapy: Group Therapy  Treatment Goals addressed: Coping  Interventions: CBT, DBT, Supportive and Reframing  Summary: Clinician led check-in regarding current stressors and situation, and review of patient completed daily inventory. Clinician utilized active listening and empathetic response and validated patient emotions. Clinician facilitated processing group on pertinent issues  Therapist Response: Mark [name] is a 58 y.o. male who presents with depression and anxiety symptoms.  Patient arrived within time allowed and reports that he isfeeling "not great but not as bad."Patient rates his mood at a 6.5 on a scale of 1-10 with 10 being great. Pt reports his afternoon "wasn't bad" and he did achieve organizing more in one of the bedrooms, finished his paper, and wrote up a routine. Pt reports he is feeling stressed regarding finances and is waiting  return calls from the bank. Pt identifies that schoolwork was helpful in keeping his mind occupied yesterday.  Pt continues to struggle to maintain stability despite mood. Pt engaged in discussion.       Session Time: 10:00 -11:00  Participation Level:Active  Behavioral Response:CasualAlertDepressed  Type of Therapy: Group Therapy, psychoeducation, psychotherapy  Treatment Goals addressed: Coping  Interventions:CBT, DBT, Solution Focused, Supportive and Reframing  Summary:Cln led discussion on perspective. Cln shared ways in which perception can alter the way we see our reality and the importance of working to make perceptions more accurate. Group discussed ways in which their perceptions have hindered progress and neutrality/positivity.   Therapist Response: Patient struggles when considering other people's motivations and is quick to assume they are lacking in some way in their interactions with him. Pt recognizes intellectually that he does not "know" what people are thinking however does not "buy it" and continues with his thought patterns. Pt states he understands the concept of perception and how it shapes reality, however is resistant to moving on this point at this time.      Session Time: 11:00 -12:00  Participation Level:Active  Behavioral Response:CasualAlertDepressed  Type of Therapy: Group Therapy, psychotherapy  Treatment Goals addressed: Coping  Interventions:Strengths based, reframing, Supportive,   Summary:Spiritual Care group  Therapist Response: Patient engaged in group. See chaplain note.       Session Time: 12:00- 1:00  Participation Level:Active  Behavioral Response:CasualAlertDepressed  Type of Therapy: Group Therapy, Psychoeducation  Treatment Goals addressed: Coping  Interventions:relaxation training; Supportive; Reframing  Summary:12:00 - 12:50: Relaxation group: Cln led group focused on  retraining the body's response to stress.  12:50 -1:00 Clinician led check-out.  Clinician assessed for immediate needs, medication compliance and efficacy, and safety concerns  Therapist Response:Pt engaged in activity and discussion. At check-out, patient rates hismood at [facility]a8on a scale of 1-10 with 10 being great. Patient reports afternoon plans of grocery shopping, virtual church meeting, and working on another paper. Patient demonstrates some progress as evidenced by achieving goals made yesterday. Patient denies SI/HI/self-harm thoughts at the end of group.      Suicidal/Homicidal: Nowithout intent/plan   Plan: Pt will continue in PHP while working to decrease depression and anxiety symptoms and increase ability to manage symptoms in a healthy manner.   Diagnosis: MDD (major depressive disorder), recurrent severe, without psychosis (HCC) [F33.2]    1. MDD (major depressive disorder), recurrent severe, without psychosis (HCC)   2. PTSD (post-traumatic stress disorder)   3. Generalized anxiety disorder       Donia GuilesJenny Humaira Sculley, LCSW 06/17/2018

## 2018-06-17 NOTE — Psych (Signed)
Virtual Visit via Video Note  I connected with Mark [name] on 06/03/18 at  9:00 AM EDT by a video enabled telemedicine application and verified that I am speaking with the correct person using two identifiers.   I discussed the limitations of evaluation and management by telemedicine and the availability of in person appointments. The patient expressed understanding and agreed to proceed.   I discussed the assessment and treatment plan with the patient. The patient was provided an opportunity to ask questions and all were answered. The patient agreed with the plan and demonstrated an understanding of the instructions.   The patient was advised to call back or seek an in-person evaluation if the symptoms worsen or if the condition fails to improve as anticipated.  Pt was provided 240 minutes of non-face-to-face time during this encounter.   Mark Guiles, LCSW    [facility] PHP THERAPIST PROGRESS NOTE  Mark [name] [id number]  Session Time: 9:00 - 10:00  Participation Level: Active  Behavioral Response: CasualAlertDepressed  Type of Therapy: Group Therapy  Treatment Goals addressed: Coping  Interventions: CBT, DBT, Supportive and Reframing  Summary: Clinician led check-in regarding current stressors and situation, and review of patient completed daily inventory. Clinician utilized active listening and empathetic response and validated patient emotions. Clinician facilitated processing group on pertinent issues  Therapist Response: Mark [name] is a 58 y.o. male who presents with depression and anxiety symptoms.  Patient arrived within time allowed and reports that he isfeeling "tired."Patient rates his mood at a 3 on a scale of 1-10 with 10 being great. Pt reports he did not want to come to group today, but did because "I know it's good for me." Pt shares he woke up in a bad mood and showered and did his devotion time which typically improves his mood, but it didn't today which was  disheartening. Pt reports yesterday was "ok" and that he had a lot of virtual meetings and appointments. Pt continues to struggle with mood dependent thinking. Pt engaged in discussion.       Session Time: 10:00 -11:00  Participation Level: Active  Behavioral Response: CasualAlertDepressed  Type of Therapy: Group Therapy, psychoeducation, psychotherapy  Treatment Goals addressed: Coping  Interventions: CBT, DBT, Solution Focused, Supportive and Reframing  Summary:  Cln led discussion on life amid restrictions from COVID-19 pandemic. Group discussed how it is affecting them the longer it continues. Group discussed ways to be kind to ourselves during this time.   Therapist Response: Pt reports the state of the world is a big stressor for him and he easily becomes angry when he watches the news. Pt states he disagrees with the way things are being handled and the way people are responding. Pt recognizes that feeling out of control is a component of this and it feels "high stakes" because it affects him in a personal way. Pt reports he is limiting his news intake to help address this frustration.       Session Time: 11:00 - 12:00  Participation Level:Active  Behavioral Response:CasualAlertDepressed  Type of Therapy: Group Therapy, OT  Treatment Goals addressed: Coping  Interventions:Psychosocial skills training, Supportive,   Summary:Occupational Therapy group  Therapist Response: Patient engaged in group. See OT note.       Session Time: 12:00- 1:00  Participation Level: Active  Behavioral Response: CasualAlertDepressed  Type of Therapy: Group Therapy, Psychoeducation; Psychotherapy  Treatment Goals addressed: Coping  Interventions: CBT; Solution focused; Supportive; Reframing  Summary: 12:00 - 12:50 Cln continued topic of  distress tolerance skills and reviewed from yesterday. Cln provided education on ACCEPTS skills. Group  brainstormed ways to practice each skill and discussed how they can utilize it in their every day lives.  12:50 -1:00 Clinician led check-out. Clinician assessed for immediate needs, medication compliance and efficacy, and safety concerns   Therapist Response: Pt engaged in discussion. Pt identified cooking, working on his business, listening to music, and calling his closest friend as a way to utilize ACCEPTS.    At checkout, pt rates his mood at a 6.5 on a scale of 1-10 with 10 being great. Patient reports afternoon plans of working on a routine, school work, organizing a room in the house. Patient demonstrates some progress as evidenced by holding on to logic when his feelings escalated. Pt denies SI/HI at end of group.       Suicidal/Homicidal: Nowithout intent/plan   Plan: Pt will continue in PHP while working to decrease depression and anxiety symptoms and increase ability to manage symptoms in a healthy manner.   Diagnosis: MDD (major depressive disorder), recurrent severe, without psychosis (HCC) [F33.2]    1. MDD (major depressive disorder), recurrent severe, without psychosis (HCC)   2. Generalized anxiety disorder   3. PTSD (post-traumatic stress disorder)       Mark GuilesJenny Negin Hegg, LCSW 06/17/2018

## 2018-06-17 NOTE — Psych (Signed)
Virtual Visit via Video Note  I connected with Mark [name] on 05/011/20 at  9:00 AM EDT by a video enabled telemedicine application and verified that I am speaking with the correct person using two identifiers.   I discussed the limitations of evaluation and management by telemedicine and the availability of in person appointments. The patient expressed understanding and agreed to proceed.   I discussed the assessment and treatment plan with the patient. The patient was provided an opportunity to ask questions and all were answered. The patient agreed with the plan and demonstrated an understanding of the instructions.   The patient was advised to call back or seek an in-person evaluation if the symptoms worsen or if the condition fails to improve as anticipated.  Pt was provided 240 minutes of non-face-to-face time during this encounter.   [name], LCSW    [facility] PHP THERAPIST PROGRESS NOTE  Mark [name] [id number]  Session Time: 9:00 - 10:00  Participation Level: Active  Behavioral Response: CasualAlertDepressed  Type of Therapy: Group Therapy  Treatment Goals addressed: Coping  Interventions: CBT, DBT, Supportive and Reframing  Summary: Clinician led check-in regarding current stressors and situation, and review of patient completed daily inventory. Clinician utilized active listening and empathetic response and validated patient emotions. Clinician facilitated processing group on pertinent issues  Therapist Response: Mark [name] is a 58 y.o. male who presents with depression and anxiety symptoms.  Patient arrived within time allowed and reports that he isfeeling "a little better than average."Patient rates his mood at a 6 on a scale of 1-10 with 10 being great. Pt reports he fell out of his chair yesterday and has been dealing with pain since. Pt states he has been using pain medications he has and informed his doctor to keep her informed. Pt shares it has affected  his mood however he is trying to keep a handle on it. Pt shares he began a book about reframing negativity over the weekend and is really interested in the topic. Pt shares he has been working on sleep hygiene over the weekend and is focused on not taking naps and keeping a regular bed time. Pt continues to struggle with being hard on himself. Pt engaged in discussion.       Session Time: 10:00 -11:00  Participation Level: Active  Behavioral Response: CasualAlertDepressed  Type of Therapy: Group Therapy, psychoeducation, psychotherapy  Treatment Goals addressed: Coping  Interventions: CBT, DBT, Solution Focused, Supportive and Reframing  Summary: Cln led discussion on acceptance and how it can lessen our emotional distress. Group discussed struggles they have with acceptance and how the concept could impact their situations.    Therapist Response: Pt shares he feels he has accepted what needs to be accepted. As discussion progresses, pt allows that he may seek to control situations more than he gave credit to initially.      Session Time: 11:00 -12:00  Participation Level: Active  Behavioral Response: CasualAlertDepressed  Type of Therapy: Group Therapy, psychoeducation, psychotherapy  Treatment Goals addressed: Coping  Interventions: CBT, DBT, Solution Focused, Supportive and Reframing  Summary: Cln introduced distress tolerance skills, educating on their purpose and how to practice them. Cln introduced STOP skill and group discussed how they could utilize this skill in their every day life.    Therapist Response: Pt reports understanding of skills discussed. Pt identifies STOP skill as a helpful one to use when he is angry or wants to "pop off" at others.      Session  Time: 12:00- 1:00  Participation Level: Active  Behavioral Response: CasualAlertDepressed  Type of Therapy: Group Therapy, Psychoeducation; Psychotherapy  Treatment  Goals addressed: Coping  Interventions: CBT; Solution focused; Supportive; Reframing  Summary: 12:00 - 12:50 Cln continued topic of distress tolerance skills. Cln introduced TIP skill and group discussed how to implement the skill in their every day life.  12:50 -1:00 Clinician led check-out. Clinician assessed for immediate needs, medication compliance and efficacy, and safety concerns   Therapist Response: Pt reports understanding of TIPP skills. Pt identifies Paced Breathing as the one he is most likely to utilize.  At checkout, pt rates his mood at a 7 on a scale of 1-10 with 10 being great. Patient reports afternoon plans of redrafting a paper, finishing his book, and working on cleaning out another room. Patient demonstrates some progress as evidenced by willingness to re-evaluate during group and shift. Pt denies SI/HI at end of group.       Suicidal/Homicidal: Nowithout intent/plan   Plan: Pt will continue in PHP while working to decrease depression and anxiety symptoms and increase ability to manage symptoms in a healthy manner.   Diagnosis: MDD (major depressive disorder), recurrent severe, without psychosis (HCC) [F33.2]    1. MDD (major depressive disorder), recurrent severe, without psychosis (HCC)   2. PTSD (post-traumatic stress disorder)   3. Generalized anxiety disorder       Donia GuilesJenny Dereck Agerton, LCSW 06/17/2018

## 2018-06-18 ENCOUNTER — Other Ambulatory Visit: Payer: Self-pay

## 2018-06-18 ENCOUNTER — Encounter (HOSPITAL_COMMUNITY): Payer: Self-pay | Admitting: Psychiatry

## 2018-06-18 ENCOUNTER — Ambulatory Visit (INDEPENDENT_AMBULATORY_CARE_PROVIDER_SITE_OTHER): Payer: No Typology Code available for payment source | Admitting: Psychiatry

## 2018-06-18 DIAGNOSIS — F331 Major depressive disorder, recurrent, moderate: Secondary | ICD-10-CM

## 2018-06-18 DIAGNOSIS — F411 Generalized anxiety disorder: Secondary | ICD-10-CM | POA: Diagnosis not present

## 2018-06-18 DIAGNOSIS — F4310 Post-traumatic stress disorder, unspecified: Secondary | ICD-10-CM | POA: Diagnosis not present

## 2018-06-18 MED ORDER — GABAPENTIN 300 MG PO CAPS: 300.0000 mg | ORAL_CAPSULE | Freq: Two times a day (BID) | ORAL | 1 refills | Status: DC

## 2018-06-18 NOTE — Psych (Signed)
Virtual Visit via Video Note  I connected with Mark Mark [name] on 06/09/18 at  9:00 AM EDT by a video enabled telemedicine application and verified that I am speaking with the correct person using two identifiers.   I discussed the limitations of evaluation and management by telemedicine and the availability of in person appointments. The patient expressed understanding and agreed to proceed.   I discussed the assessment and treatment plan with the patient. The patient was provided an opportunity to ask questions and all were answered. The patient agreed with the plan and demonstrated an understanding of the instructions.   The patient was advised to call back or seek an in-person evaluation if the symptoms worsen or if the condition fails to improve as anticipated.  Pt was provided 240 minutes of non-face-to-face time during this encounter.   Donia GuilesJenny Jahmeek Shirk, LCSW    [facility] THERAPIST PROGRESS [facility]  Mark Hoesric Teem [id number]  Session Time: 9:00 - 10:00  Participation Level: Active  Behavioral Response: CasualAlertDepressed  Type of Therapy: Group Therapy  Treatment Goals addressed: Coping  Interventions: CBT, DBT, Supportive and Reframing  Summary: Clinician led check-in regarding current stressors and situation, and review of patient completed daily inventory. Clinician utilized active listening and empathetic response and validated patient emotions. Clinician facilitated processing group on pertinent issues  Therapist Response: Mark [name] is a 58 y.o. male who presents with depression and anxiety symptoms.  Patient arrived within time allowed and reports that he isfeeling "lot of anxiety today." Patient rates his mood at a 5 on a scale of 1-10 with 10 being great. Pt reports his weekend was up and down. Pt shares having conflict with his wife and his daughter separately, and employing fair fighting skills. Pt becomes quickly worked up again discussing the conflicts, however  states he felt he used skills well and is pleased with that. Pt shares he slept most of the day on Sunday and feels like "it's just what I needed." Pt shares he is struggling with managing stress regarding society opening back up. Pt able to process. Pt engaged in discussion.       Session Time: 10:00 -11:00  Participation Level: Active  Behavioral Response: CasualAlertDepressed  Type of Therapy: Group Therapy, psychoeducation, psychotherapy  Treatment Goals addressed: Coping  Interventions: CBT, DBT, Solution Focused, Supportive and Reframing  Summary: Cln led discussion on judgment and the ways in which it can spiral and hinder progress. Cln suggested pt's utilize "and" instead of "but" as a way to increase awareness that feelings can be valid even if they are problematic or end up being unhelpful. Group discussed ways of applying this concept and how judgement may have been affecting them.  Therapist Response: Pt states he likes the concept of using "and" however that is a struggle for him due to his black and white thinking. Pt states it is difficult for him to recognize when he is judging because he thinks he is right, however can see it when we deconstruct situations in group.        Session Time: 11:00 -12:00  Participation Level: Active  Behavioral Response: CasualAlertDepressed  Type of Therapy: Group Therapy, psychoeducation, psychotherapy  Treatment Goals addressed: Coping  Interventions: CBT, DBT, Solution Focused, Supportive and Reframing  Summary: Cln continued topic of cognitive distortions. Cln led review of topic from Friday and continued to review cognitive distortion handout. Group identified real life examples of distortions discussed.    Therapist Response: Pt reports understanding of distortions. Pt identified  real life examples for should statements, magical thinking, and black and white thinking.        Session Time:  12:00- 1:00  Participation Level: Active  Behavioral Response: CasualAlertDepressed  Type of Therapy: Group Therapy, Psychoeducation; Psychotherapy  Treatment Goals addressed: Coping  Interventions: CBT; Solution focused; Supportive; Reframing  Summary: 12:00 - 12:50 Cln introduced second part of "catch, challenge, change" and group began to explore ways to "challenge" distortions once they are identified. Cln utilized Administrator, Civil Service Questions" as ways to bring logic back into distorted thinking.  12:50 -1:00 Clinician led check-out. Clinician assessed for immediate needs, medication compliance and efficacy, and safety concerns   Therapist Response: Pt reports understanding of how to challenge distorted thinking and demonstrates this by successfully challenging examples discussed.  At checkout, pt rates his mood at a 6 on a scale of 1-10 with 10 being great. Patient reports afternoon plans of continuing to organize rooms in the house and calling the bank. Patient demonstrates some progress as evidenced by willingness to challenge difficult thinking. Pt denies SI/HI at end of group.       Suicidal/Homicidal: Nowithout intent/plan   Plan: Pt will continue in PHP while working to decrease depression and anxiety symptoms and increase ability to manage symptoms in a healthy manner.   Diagnosis: MDD (major depressive disorder), recurrent severe, without psychosis (HCC) [F33.2]    1. MDD (major depressive disorder), recurrent severe, without psychosis (HCC)   2. PTSD (post-traumatic stress disorder)   3. Generalized anxiety disorder       [name], LCSW 06/18/2018

## 2018-06-18 NOTE — Progress Notes (Signed)
Virtual Visit via Video Note  I connected with Mark [name] on 06/18/18 at  9:00 AM EDT by a video enabled telemedicine application and verified that I am speaking with the correct person using two identifiers.   I discussed the limitations of evaluation and management by telemedicine and the availability of in person appointments. The patient expressed understanding and agreed to proceed.  History of Present Illness: Patient was evaluated through WebCam.  He was last seen in May 2019.  He could not come for follow-up because [place] [facility] which [place] a lot of time.  Patient recently finished PHP as he was experiencing increased anxiety, depression, feeling hopelessness and having suicidal thoughts.  [facility] and finish May 22.  He is doing better.  He endorsed that he learned a lot of new skills to handle his depression.  He is sleeping better.  He is doing weekly groups.  He is still struggle with anxiety and nervousness.  [facility]  Now he is taking Lamictal 200 mg daily, he is also started BuSpar 10 mg twice a day, hydroxyzine 25 mg twice daily and he continued on Wellbutrin XL 300 mg daily and Minipress 2 mg at bedtime.  He also takes trazodone 50 mg as needed for insomnia.  Patient is doing online classes [facility] [employer].  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  He lives by himself with his dogs.  He still struggle with dreams, sometimes nightmares with lack of motivation to do things.  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  He discontinued gabapentin as he no longer have phantom pain.  He uses mirror therapy technique which helps his phantom pain.  He denies any agitation, anger, mania, psychosis.  He is trying to engage in volunteer veterans organization but due to COVID-19 his mobilization is restricted.  He like to continue his  psychiatric care from our office.  Patient denies drinking or using any illegal substances.  His appetite is better and he noticed energy level is improved from the past.  He reported no tremors, rash or any itching but admitted since increase the Lamictal he has some itching however it does not bother him in his daily activities.    Past Psychiatric History: Reviewed. History of depression after mother died in 2009.  [facility] but stopped working after a while.  Also tried Effexor up to 225 mg but stopped working after a while.  We tried Cymbalta up to 30 mg twice a day but did not work.  No history of suicidal attempt or any psychiatric inpatient treatment.  History of PTSD and anxiety.  History of PHP in April 2020.  Tried BuSpar, hydroxyzine and [facility]entin from [facility].   Psychiatric Specialty Exam: Physical Exam  ROS  There were no vitals taken for this visit.There is no height or weight on file to calculate BMI.  General Appearance: Casual  Eye Contact:  Good  Speech:  Clear and Coherent  Volume:  Normal  Mood:  Anxious and Dysphoric  Affect:  Constricted  Thought Process:  Goal Directed  Orientation:  Full (Time, Place, and Person)  Thought Content:  Rumination  Suicidal Thoughts:  No  Homicidal Thoughts:  No  Memory:  Immediate;   Good Recent;   Good Remote;   Good  Judgement:  Good  Insight:  Good  Psychomotor Activity:  Decreased  Concentration:  Concentration: Fair and Attention Span: Fair  Recall:  Good  Fund of Knowledge:  Good  Language:  Good  Akathisia:  No  Handed:  Right  AIMS (if indicated):     Assets:  Communication Skills Desire for Improvement Housing Resilience  ADL's:  Intact  Cognition:  WNL  Sleep:   6 hrs      Assessment and Plan: Generalized anxiety disorder, major depressive disorder, recurrent.  Posttraumatic stress disorder.  I review notes from PHP, current medication which he had provided, psychosocial  stressors.  He recently finished PHP and doing better.  He is seeing [name] for therapy and also involved in weekly group for [employer] veterans.  In the past he recalls gabapentin had helped his phantom pain and anxiety but he stopped taking it when his pain got better.  He does not see any improvement with hydroxyzine or BuSpar as he continues to endorse anxiety and nervousness.  I recommend to discontinue BuSpar and hydroxyzine and try gabapentin 300 mg twice a day.  He had tolerated gabapentin before very well up to 1800 mg a day.  I recommend to continue Lamictal 200 mg daily and reminded that Lamictal can cause rash and itching and if symptoms started to get worse or if he ever develop a rash that he need to call us immediately.  Continue Minipress 2 mg daily, continue Wellbutrin XL 300 mg in the morning and continue trazodone 50 mg as needed for insomnia.  Most of his medication recently [facility] however he like to have future refills from our office.  I recommend that he should provide [name] the pharmacy where he want [name] to be sent electronically and we will happy to refill the prescription.  Discussed safety concerns and anytime having active suicidal thoughts or homicidal thought that he need to call 911 of the local emergency room.  Follow-up in 6 weeks.  Follow Up Instructions:    I discussed the assessment and treatment plan with the patient. The patient was provided an opportunity to ask questions and all were answered. The patient agreed with the plan and demonstrated an understanding of the instructions.   The patient was advised to call back or seek an in-person evaluation if the symptoms worsen or if the condition fails to improve as anticipated.  I provided 30 minutes of non-face-to-face time during this encounter.   Mark Nipper, MD

## 2018-06-19 ENCOUNTER — Ambulatory Visit (HOSPITAL_COMMUNITY): Payer: Medicare HMO | Admitting: Licensed Clinical Social Worker

## 2018-06-19 ENCOUNTER — Ambulatory Visit (INDEPENDENT_AMBULATORY_CARE_PROVIDER_SITE_OTHER): Payer: Medicare HMO | Admitting: Licensed Clinical Social Worker

## 2018-06-19 ENCOUNTER — Ambulatory Visit (HOSPITAL_COMMUNITY): Payer: Medicare HMO

## 2018-06-19 ENCOUNTER — Other Ambulatory Visit (HOSPITAL_COMMUNITY): Payer: Medicare HMO

## 2018-06-19 ENCOUNTER — Encounter (HOSPITAL_COMMUNITY): Payer: Self-pay | Admitting: Licensed Clinical Social Worker

## 2018-06-19 ENCOUNTER — Other Ambulatory Visit: Payer: Self-pay

## 2018-06-19 DIAGNOSIS — F411 Generalized anxiety disorder: Secondary | ICD-10-CM

## 2018-06-19 DIAGNOSIS — F331 Major depressive disorder, recurrent, moderate: Secondary | ICD-10-CM | POA: Diagnosis not present

## 2018-06-19 DIAGNOSIS — F4310 Post-traumatic stress disorder, unspecified: Secondary | ICD-10-CM

## 2018-06-20 ENCOUNTER — Other Ambulatory Visit (HOSPITAL_COMMUNITY): Payer: Medicare HMO

## 2018-06-20 ENCOUNTER — Ambulatory Visit (HOSPITAL_COMMUNITY): Payer: Medicare HMO

## 2018-06-23 ENCOUNTER — Other Ambulatory Visit: Payer: Self-pay

## 2018-06-23 ENCOUNTER — Ambulatory Visit (INDEPENDENT_AMBULATORY_CARE_PROVIDER_SITE_OTHER): Payer: Medicare HMO | Admitting: Licensed Clinical Social Worker

## 2018-06-23 ENCOUNTER — Encounter (HOSPITAL_COMMUNITY): Payer: Self-pay | Admitting: Licensed Clinical Social Worker

## 2018-06-23 DIAGNOSIS — F4310 Post-traumatic stress disorder, unspecified: Secondary | ICD-10-CM

## 2018-06-23 DIAGNOSIS — F411 Generalized anxiety disorder: Secondary | ICD-10-CM | POA: Diagnosis not present

## 2018-06-23 DIAGNOSIS — F332 Major depressive disorder, recurrent severe without psychotic features: Secondary | ICD-10-CM

## 2018-06-23 NOTE — Progress Notes (Signed)
Virtual Visit via Video Note  I connected with Mark [name] on 06/23/18 at  5:00 PM EDT by a video enabled telemedicine application and verified that I am speaking with the correct person using two identifiers.   I discussed the limitations of evaluation and management by telemedicine and the availability of in person appointments. The patient expressed understanding and agreed to proceed.  History of Present Illness: Pt was referred by [facility] community care for depression and ptsd.     Observations/Objective: Pt presents  via webex depressed during the veteran's group.    Summary of Progress: Patient participated in a discussion on current life events: pandemic, protests, looting and riots, curfew in the area and how this is affecting current mental health issues. Patient engaged in describing her/his current feelings. Suggested  patient continue to use her/his coping skills, asking for help, breathing exercises, mindfulness skills and diversion.       Follow Up Instructions: Will continue to see patient weekly in veteran's group and individually      I provided 90 minutes of non-face-to-face time during this encounter.   Carlyann Placide S, LCAS

## 2018-06-23 NOTE — Progress Notes (Signed)
Virtual Visit via Video Note  I connected with [name] on 05/20/18 at 12:30 PM EDT by a video enabled telemedicine application and verified that I am speaking with the correct person using two identifiers.   I discussed the limitations of evaluation and management by telemedicine and the availability of in person appointments. The patient expressed understanding and agreed to proceed.  History of Present Illness:Pt was referred to [facility] therapy for PTSD, depression and anxiety by the [facility].    Observations/Objective: Reduce irritability. Describe signs and symptoms of depression. Pt presents depressed  during webex therapy session.   Assessment and Plan: .Pt discussed his psychiatric symptoms and current life events. Today is patient's first individual session after completion of PHP. Patient discussed all the coping skills he learned in the program. Reviewed the skills with patient, what works and what doesn't. Pt described his feelings of depression today brought on by a letter from his [employer] provider who has diminshed his monthly amount by $1700. Pt described his feelings. Assisted pt with budget to move forward with less $. Pt is contemplating going back to [place] to live with his wife, who he wants to divorce. Asked open ended questions. Validated his feelings. Discussed options.       Follow Up Instructions:   I discussed the assessment and treatment plan with the patient. The patient was provided an opportunity to ask questions and all were answered. The patient agreed with the plan and demonstrated an understanding of the instructions.   The patient was advised to call back or seek an in-person evaluation if the symptoms worsen or if the condition fails to improve as anticipated.  I provided 60 minutes of non-face-to-face time during this encounter.   MACKENZIE,LISBETH S, LCAS

## 2018-06-24 NOTE — Psych (Signed)
Virtual Visit via Video Note  I connected with Mark [name] on 06/10/18 at  9:00 AM EDT by a video enabled telemedicine application and verified that I am speaking with the correct person using two identifiers.   I discussed the limitations of evaluation and management by telemedicine and the availability of in person appointments. The patient expressed understanding and agreed to proceed.   I discussed the assessment and treatment plan with the patient. The patient was provided an opportunity to ask questions and all were answered. The patient agreed with the plan and demonstrated an understanding of the instructions.   The patient was advised to call back or seek an in-person evaluation if the symptoms worsen or if the condition fails to improve as anticipated.  Pt was provided 240 minutes of non-face-to-face time during this encounter.   Mark Guiles, LCSW    [facility] PHP THERAPIST PROGRESS NOTE  Mark [name] [id number]  Session Time: 9:00 - 10:00  Participation Level: Active  Behavioral Response: CasualAlertDepressed  Type of Therapy: Group Therapy  Treatment Goals addressed: Coping  Interventions: CBT, DBT, Supportive and Reframing  Summary: Clinician led check-in regarding current stressors and situation, and review of patient completed daily inventory. Clinician utilized active listening and empathetic response and validated patient emotions. Clinician facilitated processing group on pertinent issues  Therapist Response: Mark [name] is a 58 y.o. male who presents with depression and anxiety symptoms.  Patient arrived within time allowed and reports that he isfeeling "lot of anxiety today." Patient rates his mood at a 5 on a scale of 1-10 with 10 being great. Pt reports his evening was rough due to continued phantom pains and that it disrupted his sleep. Pt shares he did make a big decision yesterday to share what he is going through with a couple he is close to. Pt shares  it is difficult to open up to anyone and he thinks it will be good to share it with them.  Pt able to process. Pt engaged in discussion.       Session Time: 10:00 -11:00  Participation Level:Active  Behavioral Response:CasualAlertDepressed  Type of Therapy: Group Therapy, psychoeducation, psychotherapy  Treatment Goals addressed: Coping  Interventions:CBT, DBT, Solution Focused, Supportive and Reframing  Summary:Cln led discussion on integrating and applying coping skills into our lives. Cln provided handouts listing activities and exercises for the purpose of helping pt's "unlock" different things to try for coping. Group shares ways they struggle to apply and talked through situations in which they could have applied skills to reinforce for next time.   Therapist Response: Ptshares he continues to struggle with using skills because "I'm just over it." When discussing, it is clear pt uses skills specifically distraction often and on purpose to manage mood and did not make connections.         Session Time: 11:00 - 12:00  Participation Level:Active  Behavioral Response:CasualAlertDepressed  Type of Therapy: Group Therapy, OT  Treatment Goals addressed: Coping  Interventions:Psychosocial skills training, Supportive,   Summary:Occupational Therapy group  Therapist Response: Patient engaged in group. See OT note.         Session Time: 12:00- 1:00  Participation Level:Active  Behavioral Response:CasualAlertDepressed  Type of Therapy: Group Therapy, Psychoeducation; Psychotherapy  Treatment Goals addressed: Coping  Interventions:CBT; Solution focused; Supportive; Reframing  Summary:12:00 - 12:50Cln continued topic of cognitive distortions. Group did activity in which they listened to/read conversations and then analyzed for distorted thinking and offered challenges.  12:50 -1:00 Clinician led check-out.  Clinician assessed for immediate needs, medication compliance and efficacy, and safety concerns   Therapist Response: Pt is able to identify and challenge distorted thinking in examples however struggles more with identifying his own distorted thinking.  At checkout, pt rates his mood at a 6 on a scale of 1-10 with 10 being great. Patient reports afternoon plans of napping and reading. Patient demonstrates some progress as evidenced by coming to group when it was difficult. Pt denies SI/HI at end of group.       Suicidal/Homicidal: Nowithout intent/plan   Plan: Pt will continue in PHP while working to decrease depression and anxiety symptoms and increase ability to manage symptoms in a healthy manner.   Diagnosis: MDD (major depressive disorder), recurrent severe, without psychosis (HCC) [F33.2]    1. MDD (major depressive disorder), recurrent severe, without psychosis (HCC)   2. Generalized anxiety disorder   3. PTSD (post-traumatic stress disorder)       Mark GuilesJenny Lucius Wise, LCSW 06/24/2018

## 2018-06-25 ENCOUNTER — Encounter (HOSPITAL_COMMUNITY): Payer: Self-pay | Admitting: Licensed Clinical Social Worker

## 2018-06-25 ENCOUNTER — Ambulatory Visit (INDEPENDENT_AMBULATORY_CARE_PROVIDER_SITE_OTHER): Payer: Medicare HMO | Admitting: Licensed Clinical Social Worker

## 2018-06-25 ENCOUNTER — Other Ambulatory Visit: Payer: Self-pay

## 2018-06-25 DIAGNOSIS — F331 Major depressive disorder, recurrent, moderate: Secondary | ICD-10-CM

## 2018-06-25 DIAGNOSIS — F4310 Post-traumatic stress disorder, unspecified: Secondary | ICD-10-CM | POA: Diagnosis not present

## 2018-06-25 DIAGNOSIS — F411 Generalized anxiety disorder: Secondary | ICD-10-CM | POA: Diagnosis not present

## 2018-06-25 NOTE — Progress Notes (Signed)
Virtual Visit via Video Note  I connected with Mark [name] on 05/20/18 at 11:00 AM EDT by a video enabled telemedicine application and verified that I am speaking with the correct person using two identifiers.   I discussed the limitations of evaluation and management by telemedicine and the availability of in person appointments. The patient expressed understanding and agreed to proceed.  History of Present Illness:Pt was referred to [facility] OP therapy for PTSD, depression and anxiety by the [facility].    Observations/Objective: Reduce irritability. Describe signs and symptoms of depression. Pt presents wnl during webex therapy session. He was a different person. "I feel better, my moods are stabilized today. After last session discussion about how I can create a budget, and live in [place] and continue the life I have established for myself." Asked open ended questions, discussing his budget, talking to his wife about a divorce, telling his son he is no longer able to support him. Patient has positive feelings about the choices he's made.    Assessment and Plan: .See patient weekly individually and in veteran's group. Discussed with pt his desire to come into the office for therapy. "At this time I don't want to come in except maybe for the group."       Follow Up Instructions:   I discussed the assessment and treatment plan with the patient. The patient was provided an opportunity to ask questions and all were answered. The patient agreed with the plan and demonstrated an understanding of the instructions.   The patient was advised to call back or seek an in-person evaluation if the symptoms worsen or if the condition fails to improve as anticipated.  I provided 60 minutes of non-face-to-face time during this encounter.   Vasilis Luhman S, LCAS

## 2018-06-26 NOTE — Psych (Signed)
Virtual Visit via Video Note  I connected with Mark [name] on 06/11/18 at  9:00 AM EDT by a video enabled telemedicine application and verified that I am speaking with the correct person using two identifiers.   I discussed the limitations of evaluation and management by telemedicine and the availability of in person appointments. The patient expressed understanding and agreed to proceed.   I discussed the assessment and treatment plan with the patient. The patient was provided an opportunity to ask questions and all were answered. The patient agreed with the plan and demonstrated an understanding of the instructions.   The patient was advised to call back or seek an in-person evaluation if the symptoms worsen or if the condition fails to improve as anticipated.  Pt was provided 240 minutes of non-face-to-face time during this encounter.   Mark Guiles, LCSW    [facility] PHP THERAPIST PROGRESS NOTE  Mark [name] [id number]  Session Time: 9:00 - 10:00  Participation Level: Active  Behavioral Response: CasualAlertDepressed  Type of Therapy: Group Therapy  Treatment Goals addressed: Coping  Interventions: CBT, DBT, Supportive and Reframing  Summary: Clinician led check-in regarding current stressors and situation, and review of patient completed daily inventory. Clinician utilized active listening and empathetic response and validated patient emotions. Clinician facilitated processing group on pertinent issues  Therapist Response: Mark [name] is a 58 y.o. male who presents with depression and anxiety symptoms.  Patient arrived within time allowed and reports that he isfeeling "struggling." Patient rates his mood at a 4 on a scale of 1-10 with 10 being great. Pt reports he continues to have phantom pains and is feeling increasingly irritable. Pt states "this is one of those days I need to stay away from people."  Pt able to process. Pt engaged in discussion.       Session  Time: 10:00 -11:00  Participation Level:Active  Behavioral Response:CasualAlertDepressed  Type of Therapy: Group Therapy, psychotherapy  Treatment Goals addressed: Coping  Interventions:Strengths based, reframing, Supportive,   Summary:Spiritual Care group  Therapist Response: Patient engaged in group. See chaplain note.         Session Time: 11:00 -12:00  Participation Level:Active  Behavioral Response:CasualAlertDepressed  Type of Therapy: Group Therapy, psychoeducation, psychotherapy  Treatment Goals addressed: Coping  Interventions:CBT, DBT, Solution Focused, Supportive and Reframing  Summary:Cln led discussion on how to handle mood fluctuations. Group processed the stress of not knowing how they would feel day to day and how it creates further emotional concerns.   Therapist Response: Patient shares instability of mood is a big issue for him and that is one of his issues currently. Pt states that at the beginning of the year he felt he was improving and had gained some ability to manage his mood well. Pt states when stay at home orders were enacted and he did not have access to his normal strategies of coping he declined and "can't seem to get it back." Pt reports the frustration of up/down mood is exacerbated by the fact he was so recently in a different place. Pt able to process.         Session Time: 12:00- 1:00  Participation Level:Active  Behavioral Response:CasualAlertDepressed  Type of Therapy: Group Therapy, Psychoeducation  Treatment Goals addressed: Coping  Interventions:relaxation training; Supportive; Reframing  Summary:12:00 - 12:50: Relaxation group: Cln led group focused on retraining the body's response to stress.  12:50 -1:00 Clinician led check-out. Clinician assessed for immediate needs, medication compliance and efficacy, and safety concerns  Therapist  Response:Pt engaged in activity and  discussion. At checkout, pt rates his mood at a 5.5 on a scale of 1-10 with 10 being great. Patient reports afternoon plans of doing homework and cooking. Patient demonstrates some progress as evidenced by openness in processing. Pt denies SI/HI at end of group.       Suicidal/Homicidal: Nowithout intent/plan   Plan: Pt will continue in PHP while working to decrease depression and anxiety symptoms and increase ability to manage symptoms in a healthy manner.   Diagnosis: MDD (major depressive disorder), recurrent severe, without psychosis (HCC) [F33.2]    1. MDD (major depressive disorder), recurrent severe, without psychosis (HCC)   2. PTSD (post-traumatic stress disorder)       Mark GuilesJenny Shelden Raborn, LCSW 06/26/2018

## 2018-06-27 NOTE — Psych (Signed)
Virtual Visit via Video Note  I connected with Mark [name] on 06/12/18 at  9:00 AM EDT by a video enabled telemedicine application and verified that I am speaking with the correct person using two identifiers.   I discussed the limitations of evaluation and management by telemedicine and the availability of in person appointments. The patient expressed understanding and agreed to proceed.   I discussed the assessment and treatment plan with the patient. The patient was provided an opportunity to ask questions and all were answered. The patient agreed with the plan and demonstrated an understanding of the instructions.   The patient was advised to call back or seek an in-person evaluation if the symptoms worsen or if the condition fails to improve as anticipated.  Pt was provided 240 minutes of non-face-to-face time during this encounter.   Donia GuilesJenny Cherrise Occhipinti, LCSW    [facility] THERAPIST PROGRESS [facility]  Mark [name] [id number]  Session Time: 9:00 - 10:00  Participation Level: Active  Behavioral Response: CasualAlertDepressed  Type of Therapy: Group Therapy  Treatment Goals addressed: Coping  Interventions: CBT, DBT, Supportive and Reframing  Summary: Clinician led check-in regarding current stressors and situation, and review of patient completed daily inventory. Clinician utilized active listening and empathetic response and validated patient emotions. Clinician facilitated processing group on pertinent issues  Therapist Response: Mark [name] is a 58 y.o. male who presents with depression and anxiety symptoms.  Patient arrived within time allowed and reports that he isfeeling "struggling." Patient rates his mood at a 5 on a scale of 1-10 with 10 being great. Pt reports becoming overly frustrated yesterday when a series of bad things happened ie burning fries and tripping his breaker. Pt states he stayed increasingly irritable however was able to do his homework and get one  organizational task done. Pt able to process. Pt engaged in discussion.        Session Time: 10:00 -11:00  Participation Level:Active  Behavioral Response:CasualAlertDepressed  Type of Therapy: Group Therapy, psychoeducation, psychotherapy  Treatment Goals addressed: Coping  Interventions:CBT, DBT, Solution Focused, Supportive and Reframing  Summary:Cln led discussion on vulnerability. Group discussed loneliness, lack of connection, and the pros and cons of opening themselves up to intimacy.  Therapist Response: Pt continues to argue that he doesn't want to be around people and that people cause him problems, however is increasingly engaging in talk about how to gain intimacy with others. Pt identifies vulnerability as something that is very difficult for him because "people will do you wrong." Pt states he has identified one couple he would like to get a deeper relationship with and planned to talk to them the other day, but didn't. Pt states he plans to talk to them some time this week about what he is dealing with right now.        Session Time: 11:00 - 12:00  Participation Level:Active  Behavioral Response:CasualAlertDepressed  Type of Therapy: Group Therapy, OT  Treatment Goals addressed: Coping  Interventions:Psychosocial skills training, Supportive,   Summary:Occupational Therapy group  Therapist Response: Patient engaged in group. See OT note.       Session Time: 12:00- 1:00  Participation Level:Active  Behavioral Response:CasualAlertDepressed  Type of Therapy: Group Therapy, Psychoeducation; Psychotherapy  Treatment Goals addressed: Coping  Interventions:CBT; Solution focused; Supportive; Reframing  Summary:12:00 - 12:50Cln introduced topic of healthy relationships. Group discussed how they felt the status of their relationships are and their history with them. Group members shared ways that knowing  themselves better could be helpful  in creating healthy relationships.   12:50 -1:00 Clinician led check-out. 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 Pt states he feels he has had healthi-ER relationships in the past, but none that were overall healthy. Pt reports feeling he has to know himself and his values better before trying to seek new relationships.  At checkout, pt rates his mood at a 6.5 on a scale of 1-10 with 10 being great. Patient reports afternoon plans of completing one thing off his to do list. Patient demonstrates some progress as evidenced by reporting improved appetite and improved mood throughout session. Pt denies SI/HI at end of group.       Suicidal/Homicidal: Nowithout intent/plan   Plan: Pt will continue in PHP while working to decrease depression and anxiety symptoms and increase ability to manage symptoms in a healthy manner.   Diagnosis: MDD (major depressive disorder), recurrent severe, without psychosis (HCC) [F33.2]    1. MDD (major depressive disorder), recurrent severe, without psychosis (HCC)   2. PTSD (post-traumatic stress disorder)   3. Generalized anxiety disorder       [name], LCSW 06/27/2018

## 2018-06-30 ENCOUNTER — Other Ambulatory Visit: Payer: Self-pay

## 2018-06-30 ENCOUNTER — Encounter (HOSPITAL_COMMUNITY): Payer: Self-pay | Admitting: Licensed Clinical Social Worker

## 2018-06-30 ENCOUNTER — Ambulatory Visit (INDEPENDENT_AMBULATORY_CARE_PROVIDER_SITE_OTHER): Payer: Medicare HMO | Admitting: Licensed Clinical Social Worker

## 2018-06-30 DIAGNOSIS — F4310 Post-traumatic stress disorder, unspecified: Secondary | ICD-10-CM | POA: Diagnosis not present

## 2018-06-30 DIAGNOSIS — F411 Generalized anxiety disorder: Secondary | ICD-10-CM

## 2018-06-30 DIAGNOSIS — F331 Major depressive disorder, recurrent, moderate: Secondary | ICD-10-CM | POA: Diagnosis not present

## 2018-06-30 NOTE — Progress Notes (Signed)
Virtual Visit via Video Note  I connected with Mark [name] on 06/30/18 at  5:00 PM EDT by a video enabled telemedicine application and verified that I am speaking with the correct person using two identifiers.   I discussed the limitations of evaluation and management by telemedicine and the availability of in person appointments. The patient expressed understanding and agreed to proceed.  History of Present Illness: Pt was referred by [facility] community care for depression and ptsd.     Observations/Objective: Pt presents  via webex depressed during the veteran's group.    Summary of Progress: :  Patient participated in a discussion on the benefit of resonant breathing, also called coherent breathing, assisting with calming anxiety and getting into a relaxed state. Patient shared the benefit of the CALM app, which assists with deep breathing. Encouraged patient to continue to use breathing techniques and the CALM app to assist with calming anxiety.        Follow Up Instructions: Will continue to see patient weekly in veteran's group and individually   I provided 90 minutes of non-face-to-face time during this encounter.   Maylee Bare S, LCAS

## 2018-07-02 ENCOUNTER — Other Ambulatory Visit: Payer: Self-pay

## 2018-07-02 ENCOUNTER — Ambulatory Visit (INDEPENDENT_AMBULATORY_CARE_PROVIDER_SITE_OTHER): Payer: Medicare HMO | Admitting: Licensed Clinical Social Worker

## 2018-07-02 ENCOUNTER — Encounter (HOSPITAL_COMMUNITY): Payer: Self-pay | Admitting: Licensed Clinical Social Worker

## 2018-07-02 DIAGNOSIS — F4310 Post-traumatic stress disorder, unspecified: Secondary | ICD-10-CM | POA: Diagnosis not present

## 2018-07-02 DIAGNOSIS — F411 Generalized anxiety disorder: Secondary | ICD-10-CM | POA: Diagnosis not present

## 2018-07-02 DIAGNOSIS — F331 Major depressive disorder, recurrent, moderate: Secondary | ICD-10-CM | POA: Diagnosis not present

## 2018-07-02 NOTE — Psych (Signed)
Virtual Visit via Video Note  I connected with Mark [name] on 06/13/18 at  9:00 AM EDT by a video enabled telemedicine application and verified that I am speaking with the correct person using two identifiers.   I discussed the limitations of evaluation and management by telemedicine and the availability of in person appointments. The patient expressed understanding and agreed to proceed.   I discussed the assessment and treatment plan with the patient. The patient was provided an opportunity to ask questions and all were answered. The patient agreed with the plan and demonstrated an understanding of the instructions.   The patient was advised to call back or seek an in-person evaluation if the symptoms worsen or if the condition fails to improve as anticipated.  Pt was provided 240 minutes of non-face-to-face time during this encounter.   Lorin Glass, LCSW    [facility] PHP THERAPIST PROGRESS NOTE  Mark [name] [id number]  Session Time: 9:00 - 10:00  Participation Level: Active  Behavioral Response: CasualAlertDepressed  Type of Therapy: Group Therapy  Treatment Goals addressed: Coping  Interventions: CBT, DBT, Supportive and Reframing  Summary: Clinician led check-in regarding current stressors and situation, and review of patient completed daily inventory. Clinician utilized active listening and empathetic response and validated patient emotions. Clinician facilitated processing group on pertinent issues  Therapist Response: Mark [name] is a 58 y.o. male who presents with depression and anxiety symptoms.  Patient arrived within time allowed and reports that he isfeeling "solid today." Patient rates his mood at a 8 on a scale of 1-10 with 10 being great. Pt reports he did not accomplish what he wanted to yesterday and that he felt stressed waiting for a call from the bank. Pt reports he spoke to his wife on the phone and they did not argue because he used good communication  skills he learned in group and is proud of himself. Pt states increased ability to manage mood despite events happening. Pt able to process. Pt engaged in discussion.      Session Time: 10:00 -11:00  Participation Level:Active  Behavioral Response:CasualAlertDepressed  Type of Therapy: Group Therapy, psychoeducation, psychotherapy  Treatment Goals addressed: Coping  Interventions:CBT, DBT, Solution Focused, Supportive and Reframing  Summary:Cln led discussion on traditions during quarantine restrictions. Group members discussed ways they have had disruptions to traditions and shared ways they could adapt them to the current climate.   Therapist Response: Pt states he normally has a cookout for [facility] Day and is having to adapt that this year. Pt reports he might pick up a take out burger rather than grill since he won't be able to have people over. Pt shares he is looking ahead to how he will adapt his typical birthday plans if stay at home orders are still in place.        Session Time: 11:00 - 12:00  Participation Level:Active  Behavioral Response:CasualAlertDepressed  Type of Therapy: Group Therapy, OT  Treatment Goals addressed: Coping  Interventions:Psychosocial skills training, Supportive,   Summary:Occupational Therapy group  Therapist Response: Patient engaged in group. See OT note.       Session Time: 12:00- 1:00  Participation Level:Active  Behavioral Response:CasualAlertDepressed  Type of Therapy: Group Therapy, Psychoeducation; Psychotherapy  Treatment Goals addressed: Coping  Interventions:CBT; Solution focused; Supportive; Reframing  Summary:12:00 - 12:50Cln continued topic of healthy relationships. Group brainstormed traits they think are important in relationships and cln educated on the 3 traits required for a healthy dynamic.    12:50 -1:00 Clinician led  check-out. Clinician assessed for  immediate needs, medication compliance and efficacy, and safety concerns   Therapist Response: Pt identifies shared values, good communication, and support as the most important characteristics in relationship. Pt agrees with top 3 healthy characteristics and states he is able to recognize when those things are present.  At checkout, pt rates his mood at a 9 on a scale of 1-10 with 10 being great. Patient reports afternoon plans of working on a paper and going to the bank. Patient demonstrates some progress as evidenced by stabilized mood and practicing skills at home. Pt denies SI/HI at end of group.       Suicidal/Homicidal: Nowithout intent/plan   Plan: Pt will discharge from PHP due to meeting treatment goals of decreased depression and anxiety symptoms and increased ability to manage symptoms in a healthy manner. Pt's progress noted by observation and self report. Pt has declined IOP due to insurance issues. Pt will step down to outpatient therapy and psychiatry and has requested to return to previous providers. Pt has follow up appointments within this agency with Dr Lolly MustacheArfeen on 5/27 and Idalia NeedleBeth Mackenzie on 5/28. Pt denies SI/HI/psychosis at time of discharge.   Diagnosis: MDD (major depressive disorder), recurrent severe, without psychosis (HCC) [F33.2]    1. MDD (major depressive disorder), recurrent severe, without psychosis (HCC)   2. PTSD (post-traumatic stress disorder)   3. Generalized anxiety disorder       Mark GuilesJenny Cinthia Rodden, LCSW 07/02/2018

## 2018-07-02 NOTE — Progress Notes (Signed)
Virtual Visit via Video Note  I connected with [name] on 07/02/18 at 11:00 AM EDT by a video enabled telemedicine application and verified that I am speaking with the correct person using two identifiers.   I discussed the limitations of evaluation and management by telemedicine and the availability of in person appointments. The patient expressed understanding and agreed to proceed.  History of Present Illness:Pt was referred to [facility] OP therapy for PTSD, depression and anxiety by the [facility].    Observations/Objective: Reduce irritability. Describe signs and symptoms of depression. Pt presents tired with low mood  during webex therapy session. Pt discussed his psychiatric symptoms and current life events. Pt shared he does not know why he is tired or having low mood. Asked open ended questions prompting responses. With pt creating a budget which results in change, pt has cut off some enjoyable things in his life. Discussed the rationale of budget cuts. Assisted pt determining a livable budget that will allow him to continue to live in [place]. Pt continues to struggle with relationship with wife and children, and how they affect his mood, has lack of control over his family's choices, even when it affects him. Assisted pt with his attitude and behavior by focusing on his thoughts, images, beliefs and attitudes that are held (cognitive processes) and how they relate to the way he behaves, as a way of dealing with his emotional problems.  Assessment and Plan: .See patient weekly individually and in veteran's group. Discussed with pt his desire to come into the office for therapy. "At this time I don't want to come in except maybe for the group."       Follow Up Instructions:   I discussed the assessment and treatment plan with the patient. The patient was provided an opportunity to ask questions and all were answered. The patient agreed with the plan and demonstrated an understanding of  the instructions.   The patient was advised to call back or seek an in-person evaluation if the symptoms worsen or if the condition fails to improve as anticipated.  I provided 60 minutes of non-face-to-face time during this encounter.   MACKENZIE,LISBETH S, LCAS

## 2018-07-03 NOTE — Psych (Signed)
Virtual Visit via Video Note  I connected with Mark [name] on 06/05/18 at  9:00 AM EDT by a video enabled telemedicine application and verified that I am speaking with the correct person using two identifiers.   I discussed the limitations of evaluation and management by telemedicine and the availability of in person appointments. The patient expressed understanding and agreed to proceed.   I discussed the assessment and treatment plan with the patient. The patient was provided an opportunity to ask questions and all were answered. The patient agreed with the plan and demonstrated an understanding of the instructions.   The patient was advised to call back or seek an in-person evaluation if the symptoms worsen or if the condition fails to improve as anticipated.  Pt was provided 240 minutes of non-face-to-face time during this encounter.   Mark [name], [facility]    [facility] THERAPIST PROGRESS NOTE  Mark [name] [id number]  Session Time: 9:00 - 10:00  Participation Level: Active  Behavioral Response: CasualAlertDepressed  Type of Therapy: Group Therapy  Treatment Goals addressed: Coping  Interventions: CBT, DBT, Supportive and Reframing  Summary: Clinician led check-in regarding current stressors and situation, and review of patient completed daily inventory. Clinician utilized active listening and empathetic response and validated patient emotions. Clinician facilitated processing group on pertinent issues  Therapist Response: Mark Hoesric Mark [name] is a 58 y.o. male who presents with depression and anxiety symptoms.  Patient arrived within time allowed and reports that he isfeeling "OK."Patient rates his mood at a 7.5 on a scale of 1-10 with 10 being great. Pt reports he got plenty of rest and did a lot of praying which helped his mood. Pt shares he was able to get some of his Aunt's clothes to [employer] which made him feel a sense of accomplishment. Pt shares she kept himself busy  with Zoom Meetings with church groups which also helped his mood. Pt continues to struggle with mood dependent thinking. Pt engaged in discussion.   Session Time: 10:00 -11:00  Participation Level: Active  Behavioral Response: CasualAlertDepressed  Type of Therapy: Group Therapy, psychoeducation, psychotherapy  Treatment Goals addressed: Coping  Interventions: CBT, DBT, Solution Focused, Supportive and Reframing  Summary:  Cln led discussion on positive affirmations. Group discussed how positive statements about self can be harder to believe than negative. Group discussed ways to use positive affirmations in every day life to help with mental health.   Therapist Response: Pt engaged in discussion. Pt reports he struggles believing others when they say positive things about him. Pt reports he doesn't give himself credit for doing things "that everyone does" such as graduate from high school. Group spent time discussing this; everyone's path is different but aspects of those paths may cross. Pt is able to identify 10 positive affirmations about self and write them down.        Session Time: 11:00 - 12:00  Participation Level:Active  Behavioral Response:CasualAlertDepressed  Type of Therapy: Group Therapy, OT  Treatment Goals addressed: Coping  Interventions:Psychosocial skills training, Supportive,   Summary:Occupational Therapy group  Therapist Response: Patient engaged in group. See OT note.       Session Time: 12:00- 1:00  Participation Level: Active  Behavioral Response: CasualAlertDepressed  Type of Therapy: Group Therapy, Psychoeducation; Psychotherapy  Treatment Goals addressed: Coping  Interventions: CBT; Solution focused; Supportive; Reframing  Summary: 12:00 - 12:50 Clinician continued topic of "Distress Tolerance". Group discussed Self Soothe and how/when patients can employ these methods to help. Patients identified  when these techniques may be helpful in their personal lives. 12:50 -1:00 Clinician led check-out. Clinician assessed for immediate needs, medication compliance and efficacy, and safety concerns   Therapist Response: Pt engaged in discussion. Pt identified hearing as most effective sense to use for self-soothe. Pt reports listening to nature and music will help him self-soothe.    At checkout, pt rates his mood at a 9 on a scale of 1-10 with 10 being great. Patient reports afternoon plans of working on school work and cleaning out his Aunt's shoes. Patient demonstrates some progress as evidenced by being able to identify positive affirmations about self and being willing to use them. Pt denies SI/HI at end of group.     Suicidal/Homicidal: Nowithout intent/plan   Plan: Pt will continue in PHP while working to decrease depression and anxiety symptoms and increase ability to manage symptoms in a healthy manner.   Diagnosis: Major depressive disorder, recurrent episode, moderate (HCC) [F33.1]    1. Major depressive disorder, recurrent episode, moderate ([place])   2. Generalized anxiety disorder   3. PTSD (post-traumatic stress disorder)       Mark [name], LCMHCA, LCASA 07/03/2018

## 2018-07-07 ENCOUNTER — Encounter (HOSPITAL_COMMUNITY): Payer: Self-pay | Admitting: Licensed Clinical Social Worker

## 2018-07-07 ENCOUNTER — Other Ambulatory Visit: Payer: Self-pay

## 2018-07-07 ENCOUNTER — Ambulatory Visit (INDEPENDENT_AMBULATORY_CARE_PROVIDER_SITE_OTHER): Payer: Medicare HMO | Admitting: Licensed Clinical Social Worker

## 2018-07-07 DIAGNOSIS — F411 Generalized anxiety disorder: Secondary | ICD-10-CM

## 2018-07-07 DIAGNOSIS — F331 Major depressive disorder, recurrent, moderate: Secondary | ICD-10-CM | POA: Diagnosis not present

## 2018-07-07 DIAGNOSIS — F4310 Post-traumatic stress disorder, unspecified: Secondary | ICD-10-CM | POA: Diagnosis not present

## 2018-07-07 NOTE — Progress Notes (Signed)
Virtual Visit via Video Note  I connected with Mark [name] on 07/07/18 at  5:00 PM EDT by a video enabled telemedicine application and verified that I am speaking with the correct person using two identifiers.   I discussed the limitations of evaluation and management by telemedicine and the availability of in person appointments. The patient expressed understanding and agreed to proceed.  History of Present Illness: Pt was referred by [facility] community care for depression and ptsd.     Observations/Objective: Pt presents  via webex depressed during the veteran's group.    Summary of Progress: Patient participated in a discussion on "Diversion Strategies" as a coping skill that allows you to stop thinking about the situation contributing to distressed emotions, for a period of time. Suggested patient use the diversion strategies when the warning signs become overwhelming emotions.   Follow Up Instructions: Will continue to see patient weekly in veteran's group and individually   I provided 60 minutes of non-face-to-face time during this encounter.   James Senn S, LCAS

## 2018-07-09 ENCOUNTER — Ambulatory Visit (INDEPENDENT_AMBULATORY_CARE_PROVIDER_SITE_OTHER): Payer: No Typology Code available for payment source | Admitting: Licensed Clinical Social Worker

## 2018-07-09 ENCOUNTER — Other Ambulatory Visit: Payer: Self-pay

## 2018-07-09 ENCOUNTER — Encounter (HOSPITAL_COMMUNITY): Payer: Self-pay | Admitting: Licensed Clinical Social Worker

## 2018-07-09 DIAGNOSIS — F4310 Post-traumatic stress disorder, unspecified: Secondary | ICD-10-CM | POA: Diagnosis not present

## 2018-07-09 DIAGNOSIS — F411 Generalized anxiety disorder: Secondary | ICD-10-CM

## 2018-07-09 DIAGNOSIS — F331 Major depressive disorder, recurrent, moderate: Secondary | ICD-10-CM | POA: Diagnosis not present

## 2018-07-09 NOTE — Progress Notes (Signed)
Virtual Visit via Video Note  I connected with Mark [name] on 07/02/18 at 11:00 AM EDT by a video enabled telemedicine application and verified that I am speaking with the correct person using two identifiers.   I discussed the limitations of evaluation and management by telemedicine and the availability of in person appointments. The patient expressed understanding and agreed to proceed.  History of Present Illness:Pt was referred to [facility] OP therapy for PTSD, depression and anxiety by the [facility]    Observations/Objective: Reduce irritability. Pt presented anxious for his webex appointment. Pt discussed his psychiatric symptoms and current life events. Pt reports he feels his anxiety has increased due to the continued Covid virus, staying at home. Pt discussed his current life events: budgeting, family relationships. Asked open ended questions and coached pt on the importance of using boundaries and I statements in family relationships. Pt discussed his feelings about budgeting. Coached pt on the importance of budgeting. Pt continues to have relationship issues with his wife. Asked open ended questions and used empathic reflection.  Assessment and Plan: .See patient weekly individually and in veteran's group. Discussed with pt his desire to come into the office for therapy. "At this time I don't want to come in except maybe for the group."  Follow Up Instructions:   I discussed the assessment and treatment plan with the patient. The patient was provided an opportunity to ask questions and all were answered. The patient agreed with the plan and demonstrated an understanding of the instructions.   The patient was advised to call back or seek an in-person evaluation if the symptoms worsen or if the condition fails to improve as anticipated.  I provided 60 minutes of non-face-to-face time during this encounter.   Mabrey Howland S, LCAS

## 2018-07-14 ENCOUNTER — Encounter (HOSPITAL_COMMUNITY): Payer: Self-pay | Admitting: Licensed Clinical Social Worker

## 2018-07-14 ENCOUNTER — Other Ambulatory Visit: Payer: Self-pay

## 2018-07-14 ENCOUNTER — Other Ambulatory Visit: Payer: Self-pay | Admitting: *Deleted

## 2018-07-14 ENCOUNTER — Ambulatory Visit (INDEPENDENT_AMBULATORY_CARE_PROVIDER_SITE_OTHER): Payer: Medicare HMO | Admitting: Licensed Clinical Social Worker

## 2018-07-14 DIAGNOSIS — F411 Generalized anxiety disorder: Secondary | ICD-10-CM | POA: Diagnosis not present

## 2018-07-14 DIAGNOSIS — Z20822 Contact with and (suspected) exposure to covid-19: Secondary | ICD-10-CM

## 2018-07-14 DIAGNOSIS — F331 Major depressive disorder, recurrent, moderate: Secondary | ICD-10-CM | POA: Diagnosis not present

## 2018-07-14 NOTE — Progress Notes (Signed)
Virtual Visit via Video Note  I connected with Mark [name] on 07/14/18 at  5:00 PM EDT by a video enabled telemedicine application and verified that I am speaking with the correct person using two identifiers.   I discussed the limitations of evaluation and management by telemedicine and the availability of in person appointments. The patient expressed understanding and agreed to proceed.  History of Present Illness: Pt was referred by [facility] community care for depression and ptsd.     Observations/Objective: Pt presents  via webex depressed during the veteran's group.    Summary of Progress:   Patient participated in a discussion on frustration and anger while dealing with the [facility], where a discussion ensued describing how that anger and frustration plays out in other areas of life. Patient received support and encouragement from other members of the group.    Follow Up Instructions: Will continue to see patient weekly in veteran's group and individually   I provided 60 minutes of non-face-to-face time during this encounter.   [name], LCAS

## 2018-07-16 ENCOUNTER — Encounter (HOSPITAL_COMMUNITY): Payer: Self-pay | Admitting: Licensed Clinical Social Worker

## 2018-07-16 ENCOUNTER — Ambulatory Visit (INDEPENDENT_AMBULATORY_CARE_PROVIDER_SITE_OTHER): Payer: Medicare HMO | Admitting: Licensed Clinical Social Worker

## 2018-07-16 ENCOUNTER — Other Ambulatory Visit: Payer: Self-pay

## 2018-07-16 DIAGNOSIS — F4310 Post-traumatic stress disorder, unspecified: Secondary | ICD-10-CM

## 2018-07-16 DIAGNOSIS — F331 Major depressive disorder, recurrent, moderate: Secondary | ICD-10-CM

## 2018-07-16 DIAGNOSIS — F411 Generalized anxiety disorder: Secondary | ICD-10-CM | POA: Diagnosis not present

## 2018-07-16 NOTE — Progress Notes (Signed)
Virtual Visit via Video Note  I connected with Mark [name] on 07/16/18 at 11:00 AM EDT by a video enabled telemedicine application and verified that I am speaking with the correct person using two identifiers.   I discussed the limitations of evaluation and management by telemedicine and the availability of in person appointments. The patient expressed understanding and agreed to proceed.  History of Present Illness:Pt was referred to [facility] OP therapy for PTSD, depression and anxiety by the [facility].    Observations/Objective: Reduce irritability. Pt presented anxious for his webex appointment. Pt discussed his psychiatric symptoms and current life events. Pt reports he continues fighting with his wife and she says mean and resentful things to him. Asked open ended questions and coached patient on challenging his negative thoughts using emotion regulation skills. Patient has been researching [facility] and [employer] training online. Assisted patient researching trainings.  Assessment and Plan: .See patient weekly individually and in veteran's group. Discussed with pt his desire to come into the office for therapy. "At this time I don't want to come in except maybe for the group."  Follow Up Instructions:   I discussed the assessment and treatment plan with the patient. The patient was provided an opportunity to ask questions and all were answered. The patient agreed with the plan and demonstrated an understanding of the instructions.   The patient was advised to call back or seek an in-person evaluation if the symptoms worsen or if the condition fails to improve as anticipated.  I provided 60 minutes of non-face-to-face time during this encounter.   MACKENZIE,LISBETH S, LCAS

## 2018-07-19 LAB — NOVEL CORONAVIRUS, NAA: SARS-CoV-2, NAA: NOT DETECTED

## 2018-07-21 ENCOUNTER — Other Ambulatory Visit: Payer: Self-pay

## 2018-07-21 ENCOUNTER — Encounter (HOSPITAL_COMMUNITY): Payer: Self-pay | Admitting: Licensed Clinical Social Worker

## 2018-07-21 ENCOUNTER — Ambulatory Visit (INDEPENDENT_AMBULATORY_CARE_PROVIDER_SITE_OTHER): Payer: Medicare HMO | Admitting: Licensed Clinical Social Worker

## 2018-07-21 DIAGNOSIS — F4310 Post-traumatic stress disorder, unspecified: Secondary | ICD-10-CM

## 2018-07-21 DIAGNOSIS — F411 Generalized anxiety disorder: Secondary | ICD-10-CM

## 2018-07-21 DIAGNOSIS — F331 Major depressive disorder, recurrent, moderate: Secondary | ICD-10-CM | POA: Diagnosis not present
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
## 2018-07-23 ENCOUNTER — Encounter (HOSPITAL_COMMUNITY): Payer: Self-pay | Admitting: Licensed Clinical Social Worker

## 2018-07-23 ENCOUNTER — Ambulatory Visit (INDEPENDENT_AMBULATORY_CARE_PROVIDER_SITE_OTHER): Payer: Medicare HMO | Admitting: Licensed Clinical Social Worker

## 2018-07-23 ENCOUNTER — Other Ambulatory Visit: Payer: Self-pay

## 2018-07-23 DIAGNOSIS — F331 Major depressive disorder, recurrent, moderate: Secondary | ICD-10-CM

## 2018-07-23 DIAGNOSIS — F411 Generalized anxiety disorder: Secondary | ICD-10-CM | POA: Diagnosis not present

## 2018-07-23 NOTE — Progress Notes (Signed)
Virtual Visit via Video Note  I connected with Mark [name] on 07/16/18 at 11:00 AM EDT by a video enabled telemedicine application and verified that I am speaking with the correct person using two identifiers.   I discussed the limitations of evaluation and management by telemedicine and the availability of in person appointments. The patient expressed understanding and agreed to proceed.  History of Present Illness:Pt was referred to [facility] OP therapy for PTSD, depression and anxiety by the [name].    Observations/Objective: Reduce irritability. Pt presented WNL for his webex appointment. Pt discussed his psychiatric symptoms and current life events. Patient reports his moods are stable today. He is moving forward with his life: budgeting, acceptance, working on self, using coping skills, using boundaries with family. Coached patient on all life events.  Assessment and Plan: .See patient weekly individually and in veteran's group. Discussed with pt his desire to come into the office for therapy. "At this time I don't want to come in except maybe for the group."  Follow Up Instructions:   I discussed the assessment and treatment plan with the patient. The patient was provided an opportunity to ask questions and all were answered. The patient agreed with the plan and demonstrated an understanding of the instructions.   The patient was advised to call back or seek an in-person evaluation if the symptoms worsen or if the condition fails to improve as anticipated.  I provided 60 minutes of non-face-to-face time during this encounter.   Shalin Vonbargen S, LCAS

## 2018-07-28 ENCOUNTER — Ambulatory Visit (INDEPENDENT_AMBULATORY_CARE_PROVIDER_SITE_OTHER): Payer: Medicare HMO | Admitting: Psychiatry

## 2018-07-28 ENCOUNTER — Other Ambulatory Visit: Payer: Self-pay

## 2018-07-28 ENCOUNTER — Ambulatory Visit (HOSPITAL_COMMUNITY): Payer: Medicare HMO | Admitting: Licensed Clinical Social Worker

## 2018-07-28 DIAGNOSIS — F331 Major depressive disorder, recurrent, moderate: Secondary | ICD-10-CM

## 2018-07-28 DIAGNOSIS — F4310 Post-traumatic stress disorder, unspecified: Secondary | ICD-10-CM | POA: Diagnosis not present

## 2018-07-28 DIAGNOSIS — F411 Generalized anxiety disorder: Secondary | ICD-10-CM | POA: Diagnosis not present

## 2018-07-29 ENCOUNTER — Encounter (HOSPITAL_COMMUNITY): Payer: Self-pay | Admitting: Psychiatry
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
## 2018-07-30 ENCOUNTER — Encounter (HOSPITAL_COMMUNITY): Payer: Self-pay | Admitting: Psychiatry

## 2018-07-30 ENCOUNTER — Ambulatory Visit (INDEPENDENT_AMBULATORY_CARE_PROVIDER_SITE_OTHER): Payer: No Typology Code available for payment source | Admitting: Psychiatry

## 2018-07-30 ENCOUNTER — Other Ambulatory Visit: Payer: Self-pay

## 2018-07-30 DIAGNOSIS — F411 Generalized anxiety disorder: Secondary | ICD-10-CM

## 2018-07-30 DIAGNOSIS — F331 Major depressive disorder, recurrent, moderate: Secondary | ICD-10-CM

## 2018-07-30 DIAGNOSIS — F4310 Post-traumatic stress disorder, unspecified: Secondary | ICD-10-CM | POA: Diagnosis not present

## 2018-07-30 MED ORDER — GABAPENTIN 300 MG PO CAPS: 300.0000 mg | ORAL_CAPSULE | Freq: Two times a day (BID) | ORAL | 0 refills | Status: DC
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
## 2018-07-31 ENCOUNTER — Other Ambulatory Visit: Payer: Self-pay

## 2018-07-31 ENCOUNTER — Ambulatory Visit (HOSPITAL_COMMUNITY): Payer: No Typology Code available for payment source | Admitting: Licensed Clinical Social Worker

## 2018-07-31 NOTE — Addendum Note (Signed)
Addended by: Lise Auer on: 07/31/2018 10:47 AM   Modules accepted: Level of Service

## 2018-08-04 ENCOUNTER — Encounter (HOSPITAL_COMMUNITY): Payer: Self-pay | Admitting: Licensed Clinical Social Worker

## 2018-08-04 ENCOUNTER — Other Ambulatory Visit: Payer: Self-pay

## 2018-08-04 ENCOUNTER — Ambulatory Visit (INDEPENDENT_AMBULATORY_CARE_PROVIDER_SITE_OTHER): Payer: Medicare HMO | Admitting: Licensed Clinical Social Worker

## 2018-08-04 DIAGNOSIS — F411 Generalized anxiety disorder: Secondary | ICD-10-CM

## 2018-08-04 DIAGNOSIS — F331 Major depressive disorder, recurrent, moderate: Secondary | ICD-10-CM

## 2018-08-04 NOTE — Progress Notes (Signed)
Virtual Visit via Video Note  I connected with Mark [name] on 08/04/18 at  5:00 PM EDT by a video enabled telemedicine application and verified that I am speaking with the correct person using two identifiers.  Location: Patient: Mark [name] Provider: Alver Fisher, LCAS   I discussed the limitations of evaluation and management by telemedicine and the availability of in person appointments. The patient expressed understanding and agreed to proceed.  History of Present Illness: Pt is referred to therapy for depression and anxiety by [facility].   Observations/Objective: Pt participated in a discussion on transitions, a natural part of life. With change, can come emotion, good or bad, which can manifest into stress, sleep issues, fear, vulnerability or depression. Pt shared he has had a rough week last week. "I've been spiraling down with my depression and anxiety. 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     Assessment and Plan: Continue to see patient individually and group weekly   Follow Up Instructions: Counselor will send Webex link for the next session. I discussed the assessment and treatment plan with the patient. The patient was provided an opportunity to ask questions and all were answered. The patient agreed with the plan and demonstrated an understanding of the instructions.   The patient was advised to call back or seek an in-person evaluation if the symptoms worsen or if the condition fails to improve as anticipated.  I provided 120 minutes of non-face-to-face time during this encounter.   MACKENZIE,LISBETH S, LCAS

## 2018-08-07 ENCOUNTER — Ambulatory Visit (INDEPENDENT_AMBULATORY_CARE_PROVIDER_SITE_OTHER): Payer: Medicare HMO | Admitting: Licensed Clinical Social Worker

## 2018-08-07 ENCOUNTER — Encounter (HOSPITAL_COMMUNITY): Payer: Self-pay | Admitting: Licensed Clinical Social Worker

## 2018-08-07 ENCOUNTER — Other Ambulatory Visit: Payer: Self-pay

## 2018-08-07 DIAGNOSIS — F411 Generalized anxiety disorder: Secondary | ICD-10-CM | POA: Diagnosis not present

## 2018-08-07 DIAGNOSIS — F331 Major depressive disorder, recurrent, moderate: Secondary | ICD-10-CM | POA: Diagnosis not present

## 2018-08-07 DIAGNOSIS — F4310 Post-traumatic stress disorder, unspecified: Secondary | ICD-10-CM

## 2018-08-07 NOTE — Progress Notes (Signed)
Virtual Visit via Video Note  I connected with Mark [name] on 08/07/18 at 10:00 AM EDT by a video enabled telemedicine application and verified that I am speaking with the correct person using two identifiers.   I discussed the limitations of evaluation and management by telemedicine and the availability of in person appointments. The patient expressed understanding and agreed to proceed.  History of Present Illness:Pt was referred to [facility] OP therapy for PTSD, depression and anxiety by the [facility].    Observations/Objective: Reduce irritability. Pt presented WNL for his webex appointment. Pt discussed his psychiatric symptoms and current life events. Patient reports his moods are stable today. Discussed skills using to keep his moods continually stable. Discussed his current stressors: family expectations. Coached pt on CBT/ABC Model showing him how his thoughts, actions and emotions are all interconnected and can influence one another. Emailed 2 worksheets on [employer]. Will review at next session.   Assessment and Plan: .See patient weekly individually and in veteran's group. Discussed with pt his desire to come into the office for therapy. "At this time I don't want to come in except maybe for the group."  Follow Up Instructions:   I discussed the assessment and treatment plan with the patient. The patient was provided an opportunity to ask questions and all were answered. The patient agreed with the plan and demonstrated an understanding of the instructions.   The patient was advised to call back or seek an in-person evaluation if the symptoms worsen or if the condition fails to improve as anticipated.  I provided 60 minutes of non-face-to-face time during this encounter.   MACKENZIE,LISBETH S, LCAS  .

## 2018-08-11 ENCOUNTER — Ambulatory Visit (INDEPENDENT_AMBULATORY_CARE_PROVIDER_SITE_OTHER): Payer: Medicare HMO | Admitting: Licensed Clinical Social Worker

## 2018-08-11 ENCOUNTER — Encounter (HOSPITAL_COMMUNITY): Payer: Self-pay | Admitting: Licensed Clinical Social Worker

## 2018-08-11 DIAGNOSIS — F4310 Post-traumatic stress disorder, unspecified: Secondary | ICD-10-CM | POA: Diagnosis not present

## 2018-08-11 DIAGNOSIS — F331 Major depressive disorder, recurrent, moderate: Secondary | ICD-10-CM

## 2018-08-11 DIAGNOSIS — F411 Generalized anxiety disorder: Secondary | ICD-10-CM | POA: Diagnosis not present

## 2018-08-11 NOTE — Progress Notes (Signed)
Virtual Visit via Video Note  I connected with Mark [name] on 08/11/18 at  5:00 PM EDT by a video enabled telemedicine application and verified that I am speaking with the correct person using two identifiers.  Location: Patient: Mark [name] Provider: Alver Fisher, LCAS   I discussed the limitations of evaluation and management by telemedicine and the availability of in person appointments. The patient expressed understanding and agreed to proceed.  History of Present Illness: Pt is referred to therapy for depression and anxiety by [facility].   Observations/Objective: Pt participated in a discussion on changing emotional responses, regulating emotions effectively and changing emotional responses. Encouraged patient to pause and check the facts before taking action.      Assessment and Plan: Continue to see patient individually and group weekly   Follow Up Instructions: Counselor will send Webex link for the next session. I discussed the assessment and treatment plan with the patient. The patient was provided an opportunity to ask questions and all were answered. The patient agreed with the plan and demonstrated an understanding of the instructions.   The patient was advised to call back or seek an in-person evaluation if the symptoms worsen or if the condition fails to improve as anticipated.  I provided 120 minutes of non-face-to-face time during this encounter.   MACKENZIE,LISBETH S, LCAS

## 2018-08-14 ENCOUNTER — Other Ambulatory Visit: Payer: Self-pay

## 2018-08-14 ENCOUNTER — Ambulatory Visit (INDEPENDENT_AMBULATORY_CARE_PROVIDER_SITE_OTHER): Payer: Medicare HMO | Admitting: Licensed Clinical Social Worker

## 2018-08-14 DIAGNOSIS — F4310 Post-traumatic stress disorder, unspecified: Secondary | ICD-10-CM | POA: Diagnosis not present

## 2018-08-14 DIAGNOSIS — F411 Generalized anxiety disorder: Secondary | ICD-10-CM | POA: Diagnosis not present

## 2018-08-14 DIAGNOSIS — F331 Major depressive disorder, recurrent, moderate: Secondary | ICD-10-CM

## 2018-08-18 ENCOUNTER — Ambulatory Visit (INDEPENDENT_AMBULATORY_CARE_PROVIDER_SITE_OTHER): Payer: Medicare HMO | Admitting: Licensed Clinical Social Worker

## 2018-08-18 ENCOUNTER — Encounter (HOSPITAL_COMMUNITY): Payer: Self-pay | Admitting: Licensed Clinical Social Worker

## 2018-08-18 ENCOUNTER — Other Ambulatory Visit: Payer: Self-pay

## 2018-08-18 DIAGNOSIS — F411 Generalized anxiety disorder: Secondary | ICD-10-CM

## 2018-08-18 DIAGNOSIS — F331 Major depressive disorder, recurrent, moderate: Secondary | ICD-10-CM

## 2018-08-18 DIAGNOSIS — F4310 Post-traumatic stress disorder, unspecified: Secondary | ICD-10-CM

## 2018-08-18 NOTE — Progress Notes (Signed)
Virtual Visit via Video Note  I connected with [name] on 08/14/18 at  3:30 PM EDT by a video enabled telemedicine application and verified that I am speaking with the correct person using two identifiers.   I discussed the limitations of evaluation and management by telemedicine and the availability of in person appointments. The patient expressed understanding and agreed to proceed.  History of Present Illness:Pt was referred to [facility] OP therapy for PTSD, depression and anxiety by the [facility].    Observations/Objective: Reduce irritability. Pt presented anxious and depressed for his webex appointment. Pt discussed his psychiatric symptoms and current life events. Reviewed his ABCDE. worksheets Activating event, belief, consequence, disputing, effective new belief. Pt completed the worksheet and went through the process with his problem:. Pt's birthday is tomorrow and his family is not coming to see him. Coached pt on the ABCDE process through the negative emotions he's feeling.    Assessment and Plan: .See patient weekly individually and in veteran's group. Discussed with pt his desire to come into the office for therapy. "At this time I don't want to come in except maybe for the group."  Follow Up Instructions:   I discussed the assessment and treatment plan with the patient. The patient was provided an opportunity to ask questions and all were answered. The patient agreed with the plan and demonstrated an understanding of the instructions.   The patient was advised to call back or seek an in-person evaluation if the symptoms worsen or if the condition fails to improve as anticipated.  I provided 60 minutes of non-face-to-face time during this encounter.   MACKENZIE,LISBETH S, LCAS  .

## 2018-08-18 NOTE — Progress Notes (Signed)
Virtual Visit via Video Note  I connected with Mark [name] on 08/18/18 at  5:00 PM EDT by a video enabled telemedicine application and verified that I am speaking with the correct person using two identifiers.  Location: Patient: Mark [name] Provider: Alver Fisher, LCAS   I discussed the limitations of evaluation and management by telemedicine and the availability of in person appointments. The patient expressed understanding and agreed to proceed.  History of Present Illness: Pt is referred to therapy for depression and anxiety by [facility].   Observations/Objective:  Pt participated in a discussion on cognitive dissonance. Patient discussed his conflicting attitudes, beliefs and behaviors. Suggested to patient the benefit in changing his attitudes, beliefs and behaviors to reduce the discomfort and restore balance.   Assessment and Plan: Continue to see patient individually and group weekl  Follow Up Instructions: Counselor will send Webex link for the next session. I discussed the assessment and treatment plan with the patient. The patient was provided an opportunity to ask questions and all were answered. The patient agreed with the plan and demonstrated an understanding of the instructions.   The patient was advised to call back or seek an in-person evaluation if the symptoms worsen or if the condition fails to improve as anticipated.  I provided 120 minutes of non-face-to-face time during this encounter.   MACKENZIE,LISBETH S, LCAS

## 2018-08-21 ENCOUNTER — Ambulatory Visit (INDEPENDENT_AMBULATORY_CARE_PROVIDER_SITE_OTHER): Payer: No Typology Code available for payment source | Admitting: Licensed Clinical Social Worker

## 2018-08-21 ENCOUNTER — Encounter (HOSPITAL_COMMUNITY): Payer: Self-pay | Admitting: Licensed Clinical Social Worker

## 2018-08-21 DIAGNOSIS — F4310 Post-traumatic stress disorder, unspecified: Secondary | ICD-10-CM

## 2018-08-21 DIAGNOSIS — F331 Major depressive disorder, recurrent, moderate: Secondary | ICD-10-CM

## 2018-08-21 DIAGNOSIS — F411 Generalized anxiety disorder: Secondary | ICD-10-CM | POA: Diagnosis not present

## 2018-08-21 NOTE — Progress Notes (Signed)
Virtual Visit via Video Note  I connected with Mark [name] on 08/14/18 at  1:30 PM EDT by a video enabled telemedicine application and verified that I am speaking with the correct person using two identifiers.   I discussed the limitations of evaluation and management by telemedicine and the availability of in person appointments. The patient expressed understanding and agreed to proceed.  History of Present Illness:Pt was referred to [facility] OP therapy for PTSD, depression and anxiety by the [facility].    Observations/Objective: Reduce irritability. Pt presented anxious and depressed for his webex appointment. Pt discussed his psychiatric symptoms and current life events. Pt reports he is tired, frustrated and angry dealing with IRS and bank trying to settle his aunt's estate. As well as dealing with his family dynamics. Used Cognitive restructuring techniques, problem solving, relaxation training, communication skills to assist patient in dealing with his frustration and anger. Assessed family dynamics which influences patient's daily functioning with his family and educated patient.  Assessment and Plan: .See patient weekly individually and in veteran's group. Discussed with pt his desire to come into the office for therapy. "At this time I don't want to come in except maybe for the group."  Follow Up Instructions:   I discussed the assessment and treatment plan with the patient. The patient was provided an opportunity to ask questions and all were answered. The patient agreed with the plan and demonstrated an understanding of the instructions.   The patient was advised to call back or seek an in-person evaluation if the symptoms worsen or if the condition fails to improve as anticipated.  I provided 60 minutes of non-face-to-face time during this encounter.   Arvada Seaborn S, LCAS  .

## 2018-08-25 ENCOUNTER — Ambulatory Visit (INDEPENDENT_AMBULATORY_CARE_PROVIDER_SITE_OTHER): Payer: No Typology Code available for payment source | Admitting: Licensed Clinical Social Worker

## 2018-08-25 ENCOUNTER — Encounter (HOSPITAL_COMMUNITY): Payer: Self-pay | Admitting: Licensed Clinical Social Worker

## 2018-08-25 ENCOUNTER — Other Ambulatory Visit: Payer: Self-pay

## 2018-08-25 DIAGNOSIS — F331 Major depressive disorder, recurrent, moderate: Secondary | ICD-10-CM | POA: Diagnosis not present

## 2018-08-25 DIAGNOSIS — F4310 Post-traumatic stress disorder, unspecified: Secondary | ICD-10-CM

## 2018-08-25 DIAGNOSIS — F411 Generalized anxiety disorder: Secondary | ICD-10-CM | POA: Diagnosis not present

## 2018-08-25 NOTE — Progress Notes (Signed)
Virtual Visit via Video Note  I connected with Mark [name] on 08/25/18 at  5:00 PM EDT by a video enabled telemedicine application and verified that I am speaking with the correct person using two identifiers.  Location: Patient: Mark [name] Provider: Alver Fisher, LCAS   I discussed the limitations of evaluation and management by telemedicine and the availability of in person appointments. The patient expressed understanding and agreed to proceed.  History of Present Illness: Pt is referred to therapy for depression and anxiety by [facility].   Observations/Objective: Pt participated in a discussion on dealing with negative emotions in healthy ways. Counselor provided psychoeducation on dealing with uncomfortable or unpleasant emotions using PATH: Pause, Acknowledge, Think, Help. Patient shared his negative emotions experienced this week. Patient was encouraged to use PATH in dealing with negative emotions in a healthy way.  Assessment and Plan: Continue to see patient individually and group weekly.  Follow Up Instructions: Counselor will send Webex link for the next session. I discussed the assessment and treatment plan with the patient. The patient was provided an opportunity to ask questions and all were answered. The patient agreed with the plan and demonstrated an understanding of the instructions.   The patient was advised to call back or seek an in-person evaluation if the symptoms worsen or if the condition fails to improve as anticipated.  I provided 90 minutes of non-face-to-face time during this encounter.   MACKENZIE,LISBETH S, LCAS

## 2018-08-27 ENCOUNTER — Ambulatory Visit (INDEPENDENT_AMBULATORY_CARE_PROVIDER_SITE_OTHER): Payer: No Typology Code available for payment source | Admitting: Licensed Clinical Social Worker

## 2018-08-27 ENCOUNTER — Other Ambulatory Visit: Payer: Self-pay

## 2018-08-27 ENCOUNTER — Encounter (HOSPITAL_COMMUNITY): Payer: Self-pay | Admitting: Licensed Clinical Social Worker

## 2018-08-27 DIAGNOSIS — F411 Generalized anxiety disorder: Secondary | ICD-10-CM

## 2018-08-27 DIAGNOSIS — F331 Major depressive disorder, recurrent, moderate: Secondary | ICD-10-CM | POA: Diagnosis not present

## 2018-08-27 DIAGNOSIS — F4310 Post-traumatic stress disorder, unspecified: Secondary | ICD-10-CM

## 2018-08-27 NOTE — Progress Notes (Addendum)
Virtual Visit via Video Note  I connected with Mark [name] on 08/27/18 at  1:30 PM EDT by a video enabled telemedicine application and verified that I am speaking with the correct person using two identifiers.   I discussed the limitations of evaluation and management by telemedicine and the availability of in person appointments. The patient expressed understanding and agreed to proceed.  History of Present Illness:Pt was referred to [facility] OP therapy for PTSD, depression and anxiety by the [facility].    Observations/Objective: Reduce irritability. Pt presented anxious and depressed for his webex appointment. Reviewed tx plan and pt verbalized acceptance of the plan. Pt discussed his psychiatric symptoms and current life events. Pt reports his moods have been low due to his continued problems with IRS and his aunt's estate.  Used CBT techniques, negative actions are because of his current distorted beliefs or thoughts. Pt reports he is lacking motivation. Coached pt on symptoms of depression.   Assessment and Plan: .See patient weekly individually and in veteran's group. Discussed with pt his desire to come into the office for therapy. "At this time I don't want to come in except maybe for the group."  Follow Up Instructions:   I discussed the assessment and treatment plan with the patient. The patient was provided an opportunity to ask questions and all were answered. The patient agreed with the plan and demonstrated an understanding of the instructions.   The patient was advised to call back or seek an in-person evaluation if the symptoms worsen or if the condition fails to improve as anticipated.  I provided 60 minutes of non-face-to-face time during this encounter.   MACKENZIE,LISBETH S, LCAS  .

## 2018-09-01 ENCOUNTER — Encounter (HOSPITAL_COMMUNITY): Payer: Self-pay | Admitting: Licensed Clinical Social Worker

## 2018-09-01 ENCOUNTER — Ambulatory Visit (INDEPENDENT_AMBULATORY_CARE_PROVIDER_SITE_OTHER): Payer: No Typology Code available for payment source | Admitting: Licensed Clinical Social Worker

## 2018-09-01 ENCOUNTER — Other Ambulatory Visit: Payer: Self-pay

## 2018-09-01 DIAGNOSIS — F4310 Post-traumatic stress disorder, unspecified: Secondary | ICD-10-CM

## 2018-09-01 DIAGNOSIS — F331 Major depressive disorder, recurrent, moderate: Secondary | ICD-10-CM | POA: Diagnosis not present

## 2018-09-01 DIAGNOSIS — F411 Generalized anxiety disorder: Secondary | ICD-10-CM | POA: Diagnosis not present

## 2018-09-01 NOTE — Progress Notes (Signed)
Virtual Visit via Video Note  I connected with Mark [name] on 09/01/18 at  5:00 PM EDT by a video enabled telemedicine application and verified that I am speaking with the correct person using two identifiers.  Location: Patient: Mark [name] Provider: Alver Fisher, LCAS   I discussed the limitations of evaluation and management by telemedicine and the availability of in person appointments. The patient expressed understanding and agreed to proceed.  History of Present Illness: Pt is referred to therapy for depression and anxiety by [facility].   Observations/Objective: Patient participated in a discussion on dealing with emotions through "lens of life." Coached group on how the brain has fixed the lenses of the familiar and comfortable, seeing things in black and white. Patient discussed how his "lens" of life affects the way he feels. Patient was encouraged to look through his personal lenses, looking at the world that makes sense to him.  Assessment and Plan: Continue to see patient individually and group weekly.  Follow Up Instructions: Counselor will send Webex link for the next session. I discussed the assessment and treatment plan with the patient. The patient was provided an opportunity to ask questions and all were answered. The patient agreed with the plan and demonstrated an understanding of the instructions.   The patient was advised to call back or seek an in-person evaluation if the symptoms worsen or if the condition fails to improve as anticipated.  I provided 90 minutes of non-face-to-face time during this encounter.   MACKENZIE,LISBETH S, LCAS

## 2018-09-02 ENCOUNTER — Other Ambulatory Visit: Payer: Self-pay

## 2018-09-02 ENCOUNTER — Encounter (HOSPITAL_COMMUNITY): Payer: Self-pay | Admitting: Licensed Clinical Social Worker

## 2018-09-02 ENCOUNTER — Ambulatory Visit (INDEPENDENT_AMBULATORY_CARE_PROVIDER_SITE_OTHER): Payer: No Typology Code available for payment source | Admitting: Licensed Clinical Social Worker

## 2018-09-02 DIAGNOSIS — F331 Major depressive disorder, recurrent, moderate: Secondary | ICD-10-CM

## 2018-09-02 DIAGNOSIS — F411 Generalized anxiety disorder: Secondary | ICD-10-CM | POA: Diagnosis not present

## 2018-09-02 DIAGNOSIS — F4310 Post-traumatic stress disorder, unspecified: Secondary | ICD-10-CM

## 2018-09-02 NOTE — Progress Notes (Signed)
Virtual Visit via Video Note  I connected with Mark [name] on 08/27/18 at 11:00 AM EDT by a video enabled telemedicine application and verified that I am speaking with the correct person using two identifiers.   I discussed the limitations of evaluation and management by telemedicine and the availability of in person appointments. The patient expressed understanding and agreed to proceed.  History of Present Illness:Pt was referred to [facility] OP therapy for PTSD, depression and anxiety by the [facility].    Observations/Objective: Reduce irritability. Pt presented depressed for his webex appointment. Patient discussed his psychiatric symptoms and current life events. Patient has made a final decision to end his marriage. Assessed and discussed family dynamics. His children are currently not supportive of his decision. Pt reports he feels alone in his decision. Coached pt on the benefits of mindfulness, including problem solving. Pt is leaving for his older daughter's graduation in [place]. None of his family is going with him. Asked open ended questions where patient open up and was vulnerable about his feelings.   Assessment and Plan: .See patient weekly individually and in veteran's group. Discussed with pt his desire to come into the office for therapy. "At this time I don't want to come in except maybe for the group."  Follow Up Instructions:   I discussed the assessment and treatment plan with the patient. The patient was provided an opportunity to ask questions and all were answered. The patient agreed with the plan and demonstrated an understanding of the instructions.   The patient was advised to call back or seek an in-person evaluation if the symptoms worsen or if the condition fails to improve as anticipated.  I provided 60 minutes of non-face-to-face time during this encounter.   MACKENZIE,LISBETH S, LCAS  .

## 2018-09-03 ENCOUNTER — Ambulatory Visit (HOSPITAL_COMMUNITY): Payer: No Typology Code available for payment source | Admitting: Licensed Clinical Social Worker

## 2018-09-08 ENCOUNTER — Ambulatory Visit (INDEPENDENT_AMBULATORY_CARE_PROVIDER_SITE_OTHER): Payer: No Typology Code available for payment source | Admitting: Licensed Clinical Social Worker

## 2018-09-08 ENCOUNTER — Encounter (HOSPITAL_COMMUNITY): Payer: Self-pay | Admitting: Licensed Clinical Social Worker

## 2018-09-08 ENCOUNTER — Other Ambulatory Visit: Payer: Self-pay

## 2018-09-08 DIAGNOSIS — F331 Major depressive disorder, recurrent, moderate: Secondary | ICD-10-CM | POA: Diagnosis not present

## 2018-09-08 DIAGNOSIS — F4310 Post-traumatic stress disorder, unspecified: Secondary | ICD-10-CM | POA: Diagnosis not present

## 2018-09-08 NOTE — Progress Notes (Signed)
Virtual Visit via Video Note  I connected with Mark [name] on 09/08/18 at  5:00 PM EDT by a video enabled telemedicine application and verified that I am speaking with the correct person using two identifiers.  Location: Patient: Mark [name] Provider: Alver Fisher, LCAS   I discussed the limitations of evaluation and management by telemedicine and the availability of in person appointments. The patient expressed understanding and agreed to proceed.  History of Present Illness: Pt is referred to therapy for depression and anxiety by [facility].   Observations/Objective:  Patient participated in a discussion on emotional intelligence. Each patient discussed the 5 characteristics of emotional intelligence. Patient was encouraged to use his emotional intelligence to understand and manage his emotions.  Assessment and Plan: Continue to see patient individually and group weekly. Patient does not want to return to the office for therapy due to the Covid-19 virus.    Follow Up Instructions: Counselor will send Webex link for the next session. I discussed the assessment and treatment plan with the patient. The patient was provided an opportunity to ask questions and all were answered. The patient agreed with the plan and demonstrated an understanding of the instructions.   The patient was advised to call back or seek an in-person evaluation if the symptoms worsen or if the condition fails to improve as anticipated.  I provided 90 minutes of non-face-to-face time during this encounter.   MACKENZIE,LISBETH S, LCAS

## 2018-09-10 ENCOUNTER — Other Ambulatory Visit: Payer: Self-pay

## 2018-09-10 ENCOUNTER — Encounter (HOSPITAL_COMMUNITY): Payer: Self-pay | Admitting: Licensed Clinical Social Worker

## 2018-09-10 ENCOUNTER — Ambulatory Visit (INDEPENDENT_AMBULATORY_CARE_PROVIDER_SITE_OTHER): Payer: No Typology Code available for payment source | Admitting: Licensed Clinical Social Worker

## 2018-09-10 DIAGNOSIS — F4310 Post-traumatic stress disorder, unspecified: Secondary | ICD-10-CM

## 2018-09-10 DIAGNOSIS — F411 Generalized anxiety disorder: Secondary | ICD-10-CM | POA: Diagnosis not present

## 2018-09-10 DIAGNOSIS — F331 Major depressive disorder, recurrent, moderate: Secondary | ICD-10-CM | POA: Diagnosis not present

## 2018-09-10 NOTE — Progress Notes (Signed)
Virtual Visit via Video Note  I connected with Mark [name] on 08/27/18 at  1:30 PM EDT by a video enabled telemedicine application and verified that I am speaking with the correct person using two identifiers.   I discussed the limitations of evaluation and management by telemedicine and the availability of in person appointments. The patient expressed understanding and agreed to proceed.  History of Present Illness:Pt was referred to [facility] OP therapy for PTSD, depression and anxiety by the [facility].    Observations/Objective: Reduce irritability. Pt presented wnl for his webex appointment. Patient discussed his psychiatric symptoms and current life events. Patient is currently in [place] for his daughter's graduation. "i'm feeling a part of my extended family, I haven't felt a part of family in a long time." Assessed pt's family dynamics. Discussed how family dynamics affects his mental well being. Discussed effective coping tools  Patient was anxious in his thoughts of telling his wife he wants a divorce. Role played effective communication, problem solving.   Assessment and Plan: .See patient weekly individually and in veteran's group. Discussed with pt his desire to come into the office for therapy. "At this time I don't want to come in except maybe for the group."  Follow Up Instructions:   I discussed the assessment and treatment plan with the patient. The patient was provided an opportunity to ask questions and all were answered. The patient agreed with the plan and demonstrated an understanding of the instructions.   The patient was advised to call back or seek an in-person evaluation if the symptoms worsen or if the condition fails to improve as anticipated.  I provided 60 minutes of non-face-to-face time during this encounter.   MACKENZIE,LISBETH S, LCAS  .

## 2018-09-15 ENCOUNTER — Encounter (HOSPITAL_COMMUNITY): Payer: Self-pay | Admitting: Licensed Clinical Social Worker

## 2018-09-15 ENCOUNTER — Other Ambulatory Visit: Payer: Self-pay

## 2018-09-15 ENCOUNTER — Ambulatory Visit (INDEPENDENT_AMBULATORY_CARE_PROVIDER_SITE_OTHER): Payer: No Typology Code available for payment source | Admitting: Licensed Clinical Social Worker

## 2018-09-15 DIAGNOSIS — F331 Major depressive disorder, recurrent, moderate: Secondary | ICD-10-CM

## 2018-09-15 NOTE — Progress Notes (Signed)
Virtual Visit via Video Note  I connected with Mark [name] on 09/15/18 at  5:00 PM EDT by a video enabled telemedicine application and verified that I am speaking with the correct person using two identifiers.  Location: Patient: Mark [name] Provider: Alver Fisher, LCAS   I discussed the limitations of evaluation and management by telemedicine and the availability of in person appointments. The patient expressed understanding and agreed to proceed.  History of Present Illness: Pt is referred to therapy for depression and anxiety by [facility].   Observations/Objective:  Pt participated in a discussion on changing emotional responses, regulating emotions effectively and changing emotional responses. Encouraged patient to use his coping skills to more effectively regulate his emotions.   Assessment and Plan: Continue to see patient individually and group weekly. Patient does not want to return to the office for therapy due to the Covid-19 virus.    Follow Up Instructions: Counselor will send Webex link for the next session. I discussed the assessment and treatment plan with the patient. The patient was provided an opportunity to ask questions and all were answered. The patient agreed with the plan and demonstrated an understanding of the instructions.   The patient was advised to call back or seek an in-person evaluation if the symptoms worsen or if the condition fails to improve as anticipated.  I provided 90 minutes of non-face-to-face time during this encounter.   Momo Braun S, LCAS

## 2018-09-17 ENCOUNTER — Encounter (HOSPITAL_COMMUNITY): Payer: Self-pay | Admitting: Licensed Clinical Social Worker

## 2018-09-17 ENCOUNTER — Ambulatory Visit (INDEPENDENT_AMBULATORY_CARE_PROVIDER_SITE_OTHER): Payer: No Typology Code available for payment source | Admitting: Licensed Clinical Social Worker

## 2018-09-17 ENCOUNTER — Other Ambulatory Visit: Payer: Self-pay

## 2018-09-17 DIAGNOSIS — F411 Generalized anxiety disorder: Secondary | ICD-10-CM | POA: Diagnosis not present

## 2018-09-17 DIAGNOSIS — F331 Major depressive disorder, recurrent, moderate: Secondary | ICD-10-CM | POA: Diagnosis not present

## 2018-09-17 NOTE — Progress Notes (Signed)
Virtual Visit via Video Note  I connected with Mark [name] on 09/17/18 at  1:30 PM EDT by a video enabled telemedicine application and verified that I am speaking with the correct person using two identifiers.   I discussed the limitations of evaluation and management by telemedicine and the availability of in person appointments. The patient expressed understanding and agreed to proceed.  History of Present Illness:Pt was referred to [facility] OP therapy for PTSD, depression and anxiety by the [facility].    Observations/Objective: Reduce irritability. Pt presents depressed today for his webex appointment. Patient discussed his psychiatric symptoms and current life events. Patient is still in [place] visiting his oldest daughter. The lift is broken on his truck so he extended his trip. Pt has reveled in the extended family trip, improving his mood stability and depressive symptoms. Pt reports he is experiencing depressive symptoms because he has been processing his thoughts and feelings about talking to his wife about divorce. Again, role played effective communication, problem solving.   Assessment and Plan: .See patient weekly individually and in veteran's group. Discussed with pt his desire to come into the office for therapy. "At this time I don't want to come in except maybe for the group."  Follow Up Instructions: I will send webex invitation weekly before each individual and group session.   I discussed the assessment and treatment plan with the patient. The patient was provided an opportunity to ask questions and all were answered. The patient agreed with the plan and demonstrated an understanding of the instructions.   The patient was advised to call back or seek an in-person evaluation if the symptoms worsen or if the condition fails to improve as anticipated.  I provided 60 minutes of non-face-to-face time during this encounter.   Jonia Oakey S, LCAS  .

## 2018-09-22 ENCOUNTER — Ambulatory Visit (INDEPENDENT_AMBULATORY_CARE_PROVIDER_SITE_OTHER): Payer: No Typology Code available for payment source | Admitting: Licensed Clinical Social Worker

## 2018-09-22 ENCOUNTER — Other Ambulatory Visit: Payer: Self-pay

## 2018-09-22 ENCOUNTER — Encounter (HOSPITAL_COMMUNITY): Payer: Self-pay | Admitting: Licensed Clinical Social Worker

## 2018-09-22 DIAGNOSIS — F331 Major depressive disorder, recurrent, moderate: Secondary | ICD-10-CM

## 2018-09-22 DIAGNOSIS — F411 Generalized anxiety disorder: Secondary | ICD-10-CM | POA: Diagnosis not present

## 2018-09-22 NOTE — Progress Notes (Signed)
Virtual Visit via Video Note  I connected with Mark [name] on 09/22/18 at  5:00 PM EDT by a video enabled telemedicine application and verified that I am speaking with the correct person using two identifiers.  Location: Patient: Mark [name] Provider: Alver Fisher, LCAS   I discussed the limitations of evaluation and management by telemedicine and the availability of in person appointments. The patient expressed understanding and agreed to proceed.  History of Present Illness: Pt is referred to therapy for depression and anxiety by [facility].   Observations/Objective:  Patient participated in a discussion on treating significant changes in mood using exercise, avoiding caffeine, healthy diet, stress management and better sleep hygiene. Patient was encouraged to use these strategies for changes in mood.  .     Assessment and Plan: Continue to see patient individually and group weekly. Patient does not want to come in the office for therapy due to the Covid-19 virus.    Follow Up Instructions:  Counselor will send Webex link for the next session. I discussed the assessment and treatment plan with the patient. The patient was provided an opportunity to ask questions and all were answered. The patient agreed with the plan and demonstrated an understanding of the instructions.   The patient was advised to call back or seek an in-person evaluation if the symptoms worsen or if the condition fails to improve as anticipated.  I provided 90 minutes of non-face-to-face time during this encounter.   Rossi Silvestro S, LCAS

## 2018-09-23 ENCOUNTER — Other Ambulatory Visit: Payer: Self-pay

## 2018-09-23 ENCOUNTER — Ambulatory Visit (INDEPENDENT_AMBULATORY_CARE_PROVIDER_SITE_OTHER): Payer: No Typology Code available for payment source | Admitting: Licensed Clinical Social Worker

## 2018-09-23 ENCOUNTER — Encounter (HOSPITAL_COMMUNITY): Payer: Self-pay | Admitting: Licensed Clinical Social Worker

## 2018-09-23 DIAGNOSIS — F331 Major depressive disorder, recurrent, moderate: Secondary | ICD-10-CM

## 2018-09-23 NOTE — Progress Notes (Signed)
Virtual Visit via Video Note  I connected with Mark [name] on 09/23/18 at  4:00 PM EDT by a video enabled telemedicine application and verified that I am speaking with the correct person using two identifiers.   I discussed the limitations of evaluation and management by telemedicine and the availability of in person appointments. The patient expressed understanding and agreed to proceed.  History of Present Illness:Pt was referred to [facility] OP therapy for PTSD, depression and anxiety by the [facility].    Observations/Objective: Reduce irritability. Patient presents depressed and anxious today for his webex appointment. Patient discussed his psychiatric symptoms and current life events. Pt is back in [place] from a 2 week trip to see his daughter in [place]. He comes home to moving forward in separation from his wife, discord with his other daughter. Assessed family dynamics. Counselor provided coping strategies and skills in addressing these issues, providing pychoeducation and exploration of feelings. Coached pt on 6 functions of family.     Assessment and Plan: .See patient weekly individually and in veteran's group. Discussed with pt his desire to come into the office for therapy. "At this time I don't want to come in except maybe for the group."  Follow Up Instructions: I will send webex invitation weekly before each individual and group session.   I discussed the assessment and treatment plan with the patient. The patient was provided an opportunity to ask questions and all were answered. The patient agreed with the plan and demonstrated an understanding of the instructions.   The patient was advised to call back or seek an in-person evaluation if the symptoms worsen or if the condition fails to improve as anticipated.  I provided 60 minutes of non-face-to-face time during this encounter.   Clay Solum S, LCAS  .

## 2018-09-25 ENCOUNTER — Ambulatory Visit (HOSPITAL_COMMUNITY): Payer: No Typology Code available for payment source | Admitting: Licensed Clinical Social Worker

## 2018-09-29 ENCOUNTER — Ambulatory Visit (HOSPITAL_COMMUNITY): Payer: Medicare HMO | Admitting: Licensed Clinical Social Worker

## 2018-10-02 ENCOUNTER — Encounter (HOSPITAL_COMMUNITY): Payer: Self-pay | Admitting: Licensed Clinical Social Worker

## 2018-10-02 ENCOUNTER — Ambulatory Visit (INDEPENDENT_AMBULATORY_CARE_PROVIDER_SITE_OTHER): Payer: No Typology Code available for payment source | Admitting: Licensed Clinical Social Worker

## 2018-10-02 ENCOUNTER — Other Ambulatory Visit: Payer: Self-pay

## 2018-10-02 DIAGNOSIS — F331 Major depressive disorder, recurrent, moderate: Secondary | ICD-10-CM | POA: Diagnosis not present

## 2018-10-06 ENCOUNTER — Encounter (HOSPITAL_COMMUNITY): Payer: Self-pay | Admitting: Licensed Clinical Social Worker

## 2018-10-06 ENCOUNTER — Ambulatory Visit (INDEPENDENT_AMBULATORY_CARE_PROVIDER_SITE_OTHER): Payer: Medicare HMO | Admitting: Licensed Clinical Social Worker

## 2018-10-06 ENCOUNTER — Other Ambulatory Visit: Payer: Self-pay

## 2018-10-06 DIAGNOSIS — F331 Major depressive disorder, recurrent, moderate: Secondary | ICD-10-CM

## 2018-10-06 NOTE — Progress Notes (Signed)
Virtual Visit via Video Note  I connected with Mark [name] on 10/02/18 at  4:00 PM EDT by a video enabled telemedicine application and verified that I am speaking with the correct person using two identifiers.   I discussed the limitations of evaluation and management by telemedicine and the availability of in person appointments. The patient expressed understanding and agreed to proceed.  History of Present Illness:Pt was referred to [facility] OP therapy for PTSD, depression and anxiety by the [facility].    Observations/Objective: Reduce irritability. Pt discussed his psychiatric symptoms and current life events. Patient presents depressed and anxious today for his webex appointment. "I'm in a bad place. My family is letting me down." Used Socratic questions.  Used CBT to assist patient in dealing with his negative thoughts. Assessed family dynamics. Assisted pt communicating with his family using effective skills. Assisted pt using boundaries with his family. Again, counselor provided coping strategies and skills in addressing these issues, providing pychoeducation and exploration of feelings. Continued education on 6 functions of family.     Assessment and Plan: .See patient weekly individually and in veteran's group. Discussed with pt his desire to come into the office for therapy. "At this time I don't want to come in except maybe for the group."  Follow Up Instructions: I will send webex invitation weekly before each individual and group session.   I discussed the assessment and treatment plan with the patient. The patient was provided an opportunity to ask questions and all were answered. The patient agreed with the plan and demonstrated an understanding of the instructions.   The patient was advised to call back or seek an in-person evaluation if the symptoms worsen or if the condition fails to improve as anticipated.  I provided 60 minutes of non-face-to-face time during this  encounter.   MACKENZIE,LISBETH S, LCAS  .

## 2018-10-06 NOTE — Progress Notes (Signed)
Virtual Visit via Video Note  I connected with Mark [name] on 10/06/18 at  5:00 PM EDT by a video enabled telemedicine application and verified that I am speaking with the correct person using two identifiers.  Location: Patient: Mark [name] Provider: Alver Fisher, LCAS   I discussed the limitations of evaluation and management by telemedicine and the availability of in person appointments. The patient expressed understanding and agreed to proceed.  History of Present Illness: Pt is referred to therapy for depression and anxiety by [facility].   Observations/Objective:      Patient participated in a discussion on the benefit of being vulnerable while sharing in group: Ask for what you need, Be willing to expose your feelings. Say what you want. Express what you really think. Slow down and be present. Patient was encouraged to show his vulnerability while sharing in group.   Assessment and Plan: Continue to see patient individually and group weekly. Patient does not want to come in the office for therapy due to the Covid-19 virus.    Follow Up Instructions:  Counselor will send Webex link for the next session. I discussed the assessment and treatment plan with the patient. The patient was provided an opportunity to ask questions and all were answered. The patient agreed with the plan and demonstrated an understanding of the instructions.   The patient was advised to call back or seek an in-person evaluation if the symptoms worsen or if the condition fails to improve as anticipated.  I provided 120 minutes of non-face-to-face time during this encounter.   Yerik Zeringue S, LCAS

## 2018-10-08 ENCOUNTER — Ambulatory Visit (INDEPENDENT_AMBULATORY_CARE_PROVIDER_SITE_OTHER): Payer: No Typology Code available for payment source | Admitting: Licensed Clinical Social Worker

## 2018-10-08 ENCOUNTER — Other Ambulatory Visit: Payer: Self-pay

## 2018-10-08 ENCOUNTER — Encounter (HOSPITAL_COMMUNITY): Payer: Self-pay | Admitting: Licensed Clinical Social Worker

## 2018-10-08 DIAGNOSIS — F331 Major depressive disorder, recurrent, moderate: Secondary | ICD-10-CM | POA: Diagnosis not present

## 2018-10-08 DIAGNOSIS — F4310 Post-traumatic stress disorder, unspecified: Secondary | ICD-10-CM

## 2018-10-08 NOTE — Progress Notes (Signed)
Virtual Visit via Video Note  I connected with Mark [name] on 10/08/18 at  4:00 PM EDT by a video enabled telemedicine application and verified that I am speaking with the correct person using two identifiers.   I discussed the limitations of evaluation and management by telemedicine and the availability of in person appointments. The patient expressed understanding and agreed to proceed.  History of Present Illness:Pt was referred to [facility] OP therapy for PTSD, depression and anxiety by the [facility].    Observations/Objective:verbalize positive and hopeful feelings of the future. Reduce irritability. Pt discussed his psychiatric symptoms and current life events. Patient presents depressed and anxious today for his webex appointment. "I'm in a bad place again this week."  Used socratic questions to delve into patient's week affecting his moods. Patient wanted to complete a ROI for counselor to speak to his daughter about his general issues of therapy. Assisted pt completing the ROI. "I feel like a failure with my marriage ending." Counselor provided psychoeducation on signs of a failing marriage and feelings of a failed marriage, moving forward after a failed marriage (forgiveness).       Assessment and Plan: .See patient weekly individually and in veteran's group. Discussed with pt his desire to come into the office for therapy. "At this time I don't want to come in except maybe for the group."  Follow Up Instructions: I will send webex invitation weekly before each individual and group session.   I discussed the assessment and treatment plan with the patient. The patient was provided an opportunity to ask questions and all were answered. The patient agreed with the plan and demonstrated an understanding of the instructions.   The patient was advised to call back or seek an in-person evaluation if the symptoms worsen or if the condition fails to improve as anticipated.  I provided 60  minutes of non-face-to-face time during this encounter.   Olanna Percifield S, LCAS  .

## 2018-10-13 ENCOUNTER — Ambulatory Visit (INDEPENDENT_AMBULATORY_CARE_PROVIDER_SITE_OTHER): Payer: No Typology Code available for payment source | Admitting: Licensed Clinical Social Worker

## 2018-10-13 ENCOUNTER — Encounter (HOSPITAL_COMMUNITY): Payer: Self-pay | Admitting: Licensed Clinical Social Worker

## 2018-10-13 ENCOUNTER — Other Ambulatory Visit: Payer: Self-pay

## 2018-10-13 DIAGNOSIS — F331 Major depressive disorder, recurrent, moderate: Secondary | ICD-10-CM | POA: Diagnosis not present

## 2018-10-13 NOTE — Progress Notes (Signed)
Virtual Visit via Video Note I connected with Mark [name] on 10/13/18 at  5:00 PM EDT by a video enabled telemedicine application and verified that I am speaking with the correct person using two identifiers.  Location: Patient: Mark [name] Provider: Alver Fisher, LCAS   I discussed the limitations of evaluation and management by telemedicine and the availability of in person appointments. The patient expressed understanding and agreed to proceed.  History of Present Illness: Pt is referred to therapy for depression and anxiety by [facility].   Observations/Objective:  Patient participated in a discussion on identifying family as a trigger for negative thoughts, negative actions and using boundaries with family. Patient was encouraged to continue identifying triggers and using boundaries even with family.  Assessment and Plan: Continue to see patient individually and group weekly. Patient does not want to come in the office for therapy due to the Covid-19 virus.    Follow Up Instructions:  Counselor will send Webex link for the next session. I discussed the assessment and treatment plan with the patient. The patient was provided an opportunity to ask questions and all were answered. The patient agreed with the plan and demonstrated an understanding of the instructions.   The patient was advised to call back or seek an in-person evaluation if the symptoms worsen or if the condition fails to improve as anticipated.  I provided 90 minutes of non-face-to-face time during this encounter.   Harley Mccartney S, LCAS

## 2018-10-15 ENCOUNTER — Other Ambulatory Visit: Payer: Self-pay

## 2018-10-15 ENCOUNTER — Encounter (HOSPITAL_COMMUNITY): Payer: Self-pay | Admitting: Licensed Clinical Social Worker

## 2018-10-15 ENCOUNTER — Ambulatory Visit (INDEPENDENT_AMBULATORY_CARE_PROVIDER_SITE_OTHER): Payer: No Typology Code available for payment source | Admitting: Licensed Clinical Social Worker

## 2018-10-15 DIAGNOSIS — F331 Major depressive disorder, recurrent, moderate: Secondary | ICD-10-CM

## 2018-10-15 DIAGNOSIS — F4310 Post-traumatic stress disorder, unspecified: Secondary | ICD-10-CM | POA: Diagnosis not present

## 2018-10-15 DIAGNOSIS — F411 Generalized anxiety disorder: Secondary | ICD-10-CM | POA: Diagnosis not present

## 2018-10-15 NOTE — Progress Notes (Signed)
Virtual Visit via Video Note  I connected with Mark [name] on 10/15/18 at  4:00 PM EDT by a video enabled telemedicine application and verified that I am speaking with the correct person using two identifiers.   I discussed the limitations of evaluation and management by telemedicine and the availability of in person appointments. The patient expressed understanding and agreed to proceed.  History of Present Illness:Pt was referred to [facility] OP therapy for PTSD, depression and anxiety by the [facility].    Observations/Objective:verbalize positive and hopeful feelings of the future. Reduce irritability. Pt discussed his psychiatric symptoms and current life events. Patient presents depressed and anxious today for his webex appointment. "I'm in a bad place again this week."  Used socratic questions to delve into patient's moods. "I'm experiencing anger and irritability." Used socratic questions. Provided psychoeducation on depressive symptoms. Pt was still in his pajamas at 4pm. Used behavior activation for his depressive symptoms. Provided psychoeducation on forgiveness.  Assessment and Plan: .See patient weekly individually and in veteran's group. Discussed with pt his desire to come into the office for therapy. "At this time I don't want to come in except maybe for the group."  Follow Up Instructions: I will send webex invitation weekly before each individual and group session.   I discussed the assessment and treatment plan with the patient. The patient was provided an opportunity to ask questions and all were answered. The patient agreed with the plan and demonstrated an understanding of the instructions.   The patient was advised to call back or seek an in-person evaluation if the symptoms worsen or if the condition fails to improve as anticipated.  I provided 60 minutes of non-face-to-face time during this encounter.   Mark [name] S, LCAS  .

## 2018-10-20 ENCOUNTER — Encounter (HOSPITAL_COMMUNITY): Payer: Self-pay | Admitting: Licensed Clinical Social Worker

## 2018-10-20 ENCOUNTER — Other Ambulatory Visit: Payer: Self-pay

## 2018-10-20 ENCOUNTER — Ambulatory Visit (INDEPENDENT_AMBULATORY_CARE_PROVIDER_SITE_OTHER): Payer: No Typology Code available for payment source | Admitting: Licensed Clinical Social Worker

## 2018-10-20 DIAGNOSIS — F331 Major depressive disorder, recurrent, moderate: Secondary | ICD-10-CM | POA: Diagnosis not present

## 2018-10-20 DIAGNOSIS — F411 Generalized anxiety disorder: Secondary | ICD-10-CM

## 2018-10-20 DIAGNOSIS — F4310 Post-traumatic stress disorder, unspecified: Secondary | ICD-10-CM

## 2018-10-20 NOTE — Progress Notes (Signed)
Virtual Visit via Video Note I connected with Mark [name] on 10/20/18 at  5:00 PM EDT by a video enabled telemedicine application and verified that I am speaking with the correct person using two identifiers.  Location: Patient: Mark [name] Provider: Alver Fisher, LCAS   I discussed the limitations of evaluation and management by telemedicine and the availability of in person appointments. The patient expressed understanding and agreed to proceed.  History of Present Illness: Pt is referred to therapy for depression and anxiety by [facility].   Observations/Objective:   Patient participated in a discussion on loss, the different types of loss, and reactions to loss. Patient identified personal losses, reactions and coping during periods of loss. Patient was encouraged to find ways to cope effectively with the pain of loss.   Assessment and Plan: Continue to see patient individually and group weekly. Patient does not want to come in the office for therapy due to the Covid-19 virus.    Follow Up Instructions:  Counselor will send Webex link for the next session. I discussed the assessment and treatment plan with the patient. The patient was provided an opportunity to ask questions and all were answered. The patient agreed with the plan and demonstrated an understanding of the instructions.   The patient was advised to call back or seek an in-person evaluation if the symptoms worsen or if the condition fails to improve as anticipated.  I provided 90 minutes of non-face-to-face time during this encounter.   MACKENZIE,LISBETH S, LCAS

## 2018-10-22 ENCOUNTER — Other Ambulatory Visit: Payer: Self-pay

## 2018-10-22 ENCOUNTER — Ambulatory Visit (INDEPENDENT_AMBULATORY_CARE_PROVIDER_SITE_OTHER): Payer: No Typology Code available for payment source | Admitting: Licensed Clinical Social Worker

## 2018-10-22 ENCOUNTER — Encounter (HOSPITAL_COMMUNITY): Payer: Self-pay | Admitting: Licensed Clinical Social Worker

## 2018-10-22 DIAGNOSIS — F331 Major depressive disorder, recurrent, moderate: Secondary | ICD-10-CM

## 2018-10-22 DIAGNOSIS — F411 Generalized anxiety disorder: Secondary | ICD-10-CM | POA: Diagnosis not present

## 2018-10-22 DIAGNOSIS — F4310 Post-traumatic stress disorder, unspecified: Secondary | ICD-10-CM

## 2018-10-22 NOTE — Progress Notes (Signed)
Virtual Visit via Video Note  I connected with [name] on 10/15/18 at  1:00 PM EDT by a video enabled telemedicine application and verified that I am speaking with the correct person using two identifiers.   I discussed the limitations of evaluation and management by telemedicine and the availability of in person appointments. The patient expressed understanding and agreed to proceed.  History of Present Illness:Pt was referred to [facility] OP therapy for PTSD, depression and anxiety by the [facility].    Observations/Objective:verbalize positive and hopeful feelings of the future. Reduce irritability. Pt discussed his psychiatric symptoms and current life events. Patient presents anxious today for his webex appointment. Patient reports his depressive symptoms are more stable today. "I'm leaving for my oldest daughter's wedding Wednesday." Used Socratic questions. Pt described his feelings about the wedding. Pt is concerned about walking her down the isle & father/daughter dance, being in a wheelchair. Reviewed you tube videos to assist patient with his feelings of being in a wheelchair.  Assessment and Plan: .See patient weekly individually and in veteran's group. Discussed with pt his desire to come into the office for therapy. "At this time I don't want to come in except maybe for the group."  Follow Up Instructions: I will send webex invitation weekly before each individual and group session.   I discussed the assessment and treatment plan with the patient. The patient was provided an opportunity to ask questions and all were answered. The patient agreed with the plan and demonstrated an understanding of the instructions.   The patient was advised to call back or seek an in-person evaluation if the symptoms worsen or if the condition fails to improve as anticipated.  I provided 60 minutes of non-face-to-face time during this encounter.   MACKENZIE,LISBETH S, LCAS  .

## 2018-10-23 ENCOUNTER — Other Ambulatory Visit: Payer: Self-pay

## 2018-10-23 ENCOUNTER — Ambulatory Visit (INDEPENDENT_AMBULATORY_CARE_PROVIDER_SITE_OTHER): Payer: No Typology Code available for payment source | Admitting: Psychiatry

## 2018-10-23 ENCOUNTER — Encounter (HOSPITAL_COMMUNITY): Payer: Self-pay | Admitting: Psychiatry

## 2018-10-23 DIAGNOSIS — F331 Major depressive disorder, recurrent, moderate: Secondary | ICD-10-CM

## 2018-10-23 DIAGNOSIS — F4310 Post-traumatic stress disorder, unspecified: Secondary | ICD-10-CM

## 2018-10-23 DIAGNOSIS — F411 Generalized anxiety disorder: Secondary | ICD-10-CM | POA: Diagnosis not present

## 2018-10-23 NOTE — Progress Notes (Signed)
Virtual Visit via Telephone Note  I connected with Mark [name] on 10/23/18 at  2:40 PM EDT by telephone and verified that I am speaking with the correct person using two identifiers.   I discussed the limitations, risks, security and privacy concerns of performing an evaluation and management service by telephone and the availability of in person appointments. I also discussed with the patient that there may be a patient responsible charge related to this service. The patient expressed understanding and agreed to proceed.   History of Present Illness: Patient was evaluated through phone session.  He is doing well on his medication.  He admitted sleeping too much and he like to cut down his gabapentin.  He does not have any more phantom pain.  He also not taking hydroxyzine and really takes trazodone.  However she like to continue Wellbutrin, Lamictal and a regular basis.  His PTSD and depression is a stable.  He is seeing Charolotte Eke for therapy.  He is in touch with the [place].  He like to have his medication sent to your pharmacy.  Patient told now he is able to get the prescription if he sent electronically to his pharmacy.  However patient do not have the details.  Patient like to hold the medication until he he gets information.  His energy level is good.  He is doing Equities trader at [employer].  He hoping to graduate this December.  Denies drinking or using any illegal substances.     Past Psychiatric History:Reviewed. History of depression after mother died in May 26, 2007. Follow-up at [facility] and prescribed Prozac but stopped working after a while. Also tried Effexor up to 225 mg but stopped working after a while. We tried Cymbalta up to 30 mg twice a day but did not work. No history of suicidal attempt or any psychiatric inpatient treatment. History of PTSD and anxiety. History of PHP in 2018-05-26. Tried BuSpar, hydroxyzine and gabapentin from [place].  Psychiatric Specialty  Exam: Physical Exam  ROS  There were no vitals taken for this visit.There is no height or weight on file to calculate BMI.  General Appearance: NA  Eye Contact:  NA  Speech:  Clear and Coherent  Volume:  Normal  Mood:  Euthymic  Affect:  NA  Thought Process:  Goal Directed  Orientation:  Full (Time, Place, and Person)  Thought Content:  Logical  Suicidal Thoughts:  No  Homicidal Thoughts:  No  Memory:  Immediate;   Good Recent;   Good Remote;   Good  Judgement:  Good  Insight:  Good  Psychomotor Activity:  NA  Concentration:  Concentration: Good and Attention Span: Good  Recall:  Good  Fund of Knowledge:  Good  Language:  Good  Akathisia:  No  Handed:  Right  AIMS (if indicated):     Assets:  Communication Skills Desire for Improvement Housing Resilience Social Support  ADL's:  Intact  Cognition:  WNL  Sleep:         Assessment and Plan:   Follow Up Instructions: Generalized anxiety disorder.  Major depressive disorder, recurrent posttraumatic stress disorder.  I will discontinue gabapentin and hydroxyzine since patient is not interested to take this medicine.  He feels gabapentin is making him more tired.  Like to continue Lamictal 200 mg daily, Wellbutrin XL 300 mg daily and Minipress 2 mg at bedtime.  He is taking trazodone as needed which is prescribed by his physician.  Patient will provide more information  how to prescribe electronically to his pharmacy.  Encouraged to continue therapy with [name].  Recommended to call us back if he has any question or any concern.  Follow-up in 3 months.   I discussed the assessment and treatment plan with the patient. The patient was provided an opportunity to ask questions and all were answered. The patient agreed with the plan and demonstrated an understanding of the instructions.   The patient was advised to call back or seek an in-person evaluation if the symptoms worsen or if the condition fails to improve as  anticipated.  I provided 20 minutes of non-face-to-face time during this encounter.   Mark Nipper, MD

## 2018-10-27 ENCOUNTER — Ambulatory Visit (INDEPENDENT_AMBULATORY_CARE_PROVIDER_SITE_OTHER): Payer: No Typology Code available for payment source | Admitting: Licensed Clinical Social Worker

## 2018-10-27 ENCOUNTER — Other Ambulatory Visit: Payer: Self-pay

## 2018-10-27 DIAGNOSIS — F331 Major depressive disorder, recurrent, moderate: Secondary | ICD-10-CM

## 2018-10-28 ENCOUNTER — Encounter (HOSPITAL_COMMUNITY): Payer: Self-pay | Admitting: Licensed Clinical Social Worker

## 2018-10-28 NOTE — Progress Notes (Signed)
Virtual Visit via Video Note I connected with Mark [name] on 10/28/18 at  5:00 PM EDT by a video enabled telemedicine application and verified that I am speaking with the correct person using two identifiers.  Location: Patient: Mark [name] Provider: Alver Fisher, LCAS   I discussed the limitations of evaluation and management by telemedicine and the availability of in person appointments. The patient expressed understanding and agreed to proceed.  History of Present Illness: Pt is referred to therapy for depression and anxiety by [facility].   Observations/Objective:   Patient participated in checking-in during the group process where he shared his struggles, questions, dilemmas, accomplishments, and failures during the past week, asking for assistance from other group members.    Assessment and Plan: Continue to see patient individually and group weekly. Patient does not want to come in the office for therapy due to the Covid-19 virus.    Follow Up Instructions:  Counselor will send Webex link for the next session. I discussed the assessment and treatment plan with the patient. The patient was provided an opportunity to ask questions and all were answered. The patient agreed with the plan and demonstrated an understanding of the instructions.   The patient was advised to call back or seek an in-person evaluation if the symptoms worsen or if the condition fails to improve as anticipated.  I provided 40 minutes of non-face-to-face time during this encounter.   MACKENZIE,LISBETH S, LCAS

## 2018-10-30 ENCOUNTER — Ambulatory Visit (HOSPITAL_COMMUNITY): Payer: Medicare HMO | Admitting: Psychiatry

## 2018-10-30 ENCOUNTER — Other Ambulatory Visit: Payer: Self-pay

## 2018-10-30 ENCOUNTER — Encounter (HOSPITAL_COMMUNITY): Payer: Self-pay | Admitting: Licensed Clinical Social Worker

## 2018-10-30 ENCOUNTER — Ambulatory Visit (INDEPENDENT_AMBULATORY_CARE_PROVIDER_SITE_OTHER): Payer: Medicare HMO | Admitting: Licensed Clinical Social Worker

## 2018-10-30 DIAGNOSIS — F4310 Post-traumatic stress disorder, unspecified: Secondary | ICD-10-CM | POA: Diagnosis not present

## 2018-10-30 DIAGNOSIS — F331 Major depressive disorder, recurrent, moderate: Secondary | ICD-10-CM | POA: Diagnosis not present

## 2018-10-30 NOTE — Progress Notes (Signed)
Virtual Visit via Video Note  I connected with [name] on 10/30/18 at 1:00pm by a video enabled telemedicine application and verified that I am speaking with the correct person using two identifiers.   I discussed the limitations of evaluation and management by telemedicine and the availability of in person appointments. The patient expressed understanding and agreed to proceed.  History of Present Illness:Pt was referred to [facility] OP therapy for PTSD, depression and anxiety by the [facility].    Observations/Objective: Verbalize positive and hopeful feelings of the future. Reduce irritability. Pt discussed his psychiatric symptoms and current life events. Patient presents anxious today for his webex appointment. He has just returned from [place] at his daughter's wedding, which was a disaster. Used Socratic questions. Patient was "emotionally drained." Emailed patient TIPP informational sheet and worksheet to assist with is overwhelming emotions. Explained to patient the importance of moving into his wise mind from his emotional mind.    Assessment and Plan: .See patient weekly individually and in veteran's group. Discussed with pt his desire to come into the office for therapy. "At this time I don't want to come in except maybe for the group."  Follow Up Instructions: I will send webex invitation weekly before each individual and group session.   I discussed the assessment and treatment plan with the patient. The patient was provided an opportunity to ask questions and all were answered. The patient agreed with the plan and demonstrated an understanding of the instructions.   The patient was advised to call back or seek an in-person evaluation if the symptoms worsen or if the condition fails to improve as anticipated.  I provided 60 minutes of non-face-to-face time during this encounter.   MACKENZIE,LISBETH S, LCAS  .

## 2018-11-03 ENCOUNTER — Encounter (HOSPITAL_COMMUNITY): Payer: Self-pay | Admitting: Licensed Clinical Social Worker

## 2018-11-03 ENCOUNTER — Ambulatory Visit (INDEPENDENT_AMBULATORY_CARE_PROVIDER_SITE_OTHER): Payer: Medicare HMO | Admitting: Licensed Clinical Social Worker

## 2018-11-03 DIAGNOSIS — F331 Major depressive disorder, recurrent, moderate: Secondary | ICD-10-CM

## 2018-11-03 DIAGNOSIS — F4310 Post-traumatic stress disorder, unspecified: Secondary | ICD-10-CM | POA: Diagnosis not present

## 2018-11-03 NOTE — Progress Notes (Signed)
Virtual Visit via Video Note I connected with Mark [name] on 11/03/18 at  5:00 PM EDT by a video enabled telemedicine application and verified that I am speaking with the correct person using two identifiers.  Location: Patient: Mark [name] Provider: Alver Fisher, LCAS   I discussed the limitations of evaluation and management by telemedicine and the availability of in person appointments. The patient expressed understanding and agreed to proceed.  History of Present Illness: Pt is referred to therapy for depression and anxiety by [facility].   Observations/Objective:   Patient participated in checking-in during the group process where he shared his struggles, questions, dilemmas, accomplishments, and failures during the past week, asking for assistance from other group members.    Assessment and Plan:  Patient participated in a discussion on boundaries. Educated group on the different types of boundaries: rigid, personal, and permeable. Patient was encouraged to use his personal boundaries.   Follow Up Instructions:  Counselor will send Webex link for the next session. I discussed the assessment and treatment plan with the patient. The patient was provided an opportunity to ask questions and all were answered. The patient agreed with the plan and demonstrated an understanding of the instructions.   The patient was advised to call back or seek an in-person evaluation if the symptoms worsen or if the condition fails to improve as anticipated.  I provided 90 minutes of non-face-to-face time during this encounter.   MACKENZIE,LISBETH S, LCAS

## 2018-11-04 ENCOUNTER — Other Ambulatory Visit: Payer: Self-pay

## 2018-11-04 ENCOUNTER — Encounter (HOSPITAL_COMMUNITY): Payer: Self-pay | Admitting: Licensed Clinical Social Worker

## 2018-11-04 ENCOUNTER — Ambulatory Visit (INDEPENDENT_AMBULATORY_CARE_PROVIDER_SITE_OTHER): Payer: Medicare HMO | Admitting: Licensed Clinical Social Worker

## 2018-11-04 DIAGNOSIS — F331 Major depressive disorder, recurrent, moderate: Secondary | ICD-10-CM

## 2018-11-04 NOTE — Progress Notes (Signed)
Patient ID: Mark [name], male   DOB: 01-02-1961, 58 y.o.   MRN: [id number]   Patient's daughter contacted counselor wanting a session to discuss her father's depression and communication issues. She had spoken with her father about her desire to speak with this counselor, so he completed a ROI so that she could speak with this counselor.The session was conducted via video enabled tele therapy Webex.   Patient introduced herself and began the session by identifying 2 areas she would like to discuss about her father. Counselor discussed parameters of the session according to the ROI.   Patient expressed her concern about his depression, isolation, irritability.  She also expressed her desire to communicate more effectively with her father.  Discussed general depressive symptoms, what counseling techniques are used for depression. Patient discussed her fear of triggering her father's depression.  Discussed effective communication styles, boundaries.  Suggestions: Go back to therapy and work with her therapist on effective communication styles using I statements. Talk with her therapist about how to effectively use boundaries with her parents. Self acknowledge the part she plays in putting herself in the middle of her parent's issues. Reminded her she may contact counselor for future appointments under the parameters of the ROI.   Alver Fisher, MS, LCAS 40 minutes

## 2018-11-06 ENCOUNTER — Ambulatory Visit (HOSPITAL_COMMUNITY): Payer: No Typology Code available for payment source | Admitting: Licensed Clinical Social Worker

## 2018-11-06 ENCOUNTER — Other Ambulatory Visit: Payer: Self-pay

## 2018-11-10 ENCOUNTER — Encounter (HOSPITAL_COMMUNITY): Payer: Self-pay | Admitting: Licensed Clinical Social Worker

## 2018-11-10 ENCOUNTER — Other Ambulatory Visit: Payer: Self-pay

## 2018-11-10 ENCOUNTER — Ambulatory Visit (INDEPENDENT_AMBULATORY_CARE_PROVIDER_SITE_OTHER): Payer: Medicare HMO | Admitting: Licensed Clinical Social Worker

## 2018-11-10 DIAGNOSIS — F331 Major depressive disorder, recurrent, moderate: Secondary | ICD-10-CM | POA: Diagnosis not present

## 2018-11-10 DIAGNOSIS — F411 Generalized anxiety disorder: Secondary | ICD-10-CM | POA: Diagnosis not present

## 2018-11-10 DIAGNOSIS — F4310 Post-traumatic stress disorder, unspecified: Secondary | ICD-10-CM | POA: Diagnosis not present

## 2018-11-10 NOTE — Progress Notes (Signed)
Virtual Visit via Video Note I connected with Mark [name] on 11/10/18 at  5:00 PM EDT by a video enabled telemedicine application and verified that I am speaking with the correct person using two identifiers.  Location: Patient: Mark [name] Provider: Alver Fisher, LCAS   I discussed the limitations of evaluation and management by telemedicine and the availability of in person appointments. The patient expressed understanding and agreed to proceed.  History of Present Illness: Pt is referred to therapy for depression and anxiety by [facility].   Observations/Objective:   Patient participated in a discussion on experiencing anxiety as communities reopen during the covid-19 pandemic, trying to resume normal activities. Patient was encouraged to focus on the health facts, follow personal feelings, understand the risk, and follow safety measures.   Assessment and Plan:  Continue to see patient 2 x per week (group and individual).    Follow Up Instructions:  Counselor will send Webex link for the next session. I discussed the assessment and treatment plan with the patient. The patient was provided an opportunity to ask questions and all were answered. The patient agreed with the plan and demonstrated an understanding of the instructions.   The patient was advised to call back or seek an in-person evaluation if the symptoms worsen or if the condition fails to improve as anticipated.  I provided 90 minutes of non-face-to-face time during this encounter.   Emeli Goguen S, LCAS

## 2018-11-13 ENCOUNTER — Ambulatory Visit (INDEPENDENT_AMBULATORY_CARE_PROVIDER_SITE_OTHER): Payer: Medicare HMO | Admitting: Licensed Clinical Social Worker

## 2018-11-13 ENCOUNTER — Encounter (HOSPITAL_COMMUNITY): Payer: Self-pay | Admitting: Licensed Clinical Social Worker

## 2018-11-13 ENCOUNTER — Other Ambulatory Visit: Payer: Self-pay

## 2018-11-13 DIAGNOSIS — F331 Major depressive disorder, recurrent, moderate: Secondary | ICD-10-CM

## 2018-11-13 DIAGNOSIS — F4310 Post-traumatic stress disorder, unspecified: Secondary | ICD-10-CM

## 2018-11-13 DIAGNOSIS — F411 Generalized anxiety disorder: Secondary | ICD-10-CM | POA: Diagnosis not present

## 2018-11-13 NOTE — Progress Notes (Signed)
Virtual Visit via Video Note  I connected with Mark [name] on 10/30/18 at 1:00pm by a video enabled telemedicine application and verified that I am speaking with the correct person using two identifiers.   I discussed the limitations of evaluation and management by telemedicine and the availability of in person appointments. The patient expressed understanding and agreed to proceed.  History of Present Illness:Pt was referred to [facility] OP therapy for PTSD, depression and anxiety by the [facility].    Observations/Objective: Verbalize positive and hopeful feelings of the future. Reduce irritability. Pt discussed his psychiatric symptoms and current life events. Patient presents anxious today for his webex appointment. Patient is anxious and frustrated because his wife wants to come for a visit this weekend. Used socratic questions. Assisted patient with his low frustration tolerance and gain high frustration tolerance. Role played effective communication skills using I-statements. Processed with patient his feelings about his wife's upcoming visit this weekend.      Assessment and Plan: .See patient weekly individually and in veteran's group. Discussed with pt his desire to come into the office for therapy. "At this time I don't want to come in except maybe for the group."  Follow Up Instructions: I will send webex invitation weekly before each individual and group session.   I discussed the assessment and treatment plan with the patient. The patient was provided an opportunity to ask questions and all were answered. The patient agreed with the plan and demonstrated an understanding of the instructions.   The patient was advised to call back or seek an in-person evaluation if the symptoms worsen or if the condition fails to improve as anticipated.  I provided 60 minutes of non-face-to-face time during this encounter.   Claretta Kendra S, LCAS  .

## 2018-11-17 ENCOUNTER — Ambulatory Visit (INDEPENDENT_AMBULATORY_CARE_PROVIDER_SITE_OTHER): Payer: Medicare HMO | Admitting: Licensed Clinical Social Worker

## 2018-11-17 ENCOUNTER — Encounter (HOSPITAL_COMMUNITY): Payer: Self-pay | Admitting: Licensed Clinical Social Worker

## 2018-11-17 ENCOUNTER — Other Ambulatory Visit: Payer: Self-pay

## 2018-11-17 DIAGNOSIS — F411 Generalized anxiety disorder: Secondary | ICD-10-CM

## 2018-11-17 DIAGNOSIS — F331 Major depressive disorder, recurrent, moderate: Secondary | ICD-10-CM | POA: Diagnosis not present

## 2018-11-17 DIAGNOSIS — F4310 Post-traumatic stress disorder, unspecified: Secondary | ICD-10-CM | POA: Diagnosis not present

## 2018-11-17 NOTE — Progress Notes (Signed)
Virtual Visit via Video Note I connected with Mark [name] on 11/17/18 at  5:00 PM EDT by a video enabled telemedicine application and verified that I am speaking with the correct person using two identifiers.  Location: Patient: Mark [name] Provider: Alver Fisher, LCAS   I discussed the limitations of evaluation and management by telemedicine and the availability of in person appointments. The patient expressed understanding and agreed to proceed.  History of Present Illness: Pt is referred to therapy for depression and anxiety by [facility].   Observations/Objective:   Patient participated in a discussion on experiencing generalized anxiety and the effects on relationships, communication, my purpose in life and being in control of my life. Patient was encouraged to use his/her effective coping skills for anxiety.  Assessment and Plan:  Continue to see patient 2 x per week (group and individual).    Follow Up Instructions:  Counselor will send Webex link for the next session. I discussed the assessment and treatment plan with the patient. The patient was provided an opportunity to ask questions and all were answered. The patient agreed with the plan and demonstrated an understanding of the instructions.   The patient was advised to call back or seek an in-person evaluation if the symptoms worsen or if the condition fails to improve as anticipated.  I provided 60 minutes of non-face-to-face time during this encounter.   MACKENZIE,LISBETH S, LCAS

## 2018-11-20 ENCOUNTER — Other Ambulatory Visit: Payer: Self-pay

## 2018-11-20 ENCOUNTER — Ambulatory Visit (INDEPENDENT_AMBULATORY_CARE_PROVIDER_SITE_OTHER): Payer: Medicare HMO | Admitting: Licensed Clinical Social Worker

## 2018-11-20 DIAGNOSIS — F331 Major depressive disorder, recurrent, moderate: Secondary | ICD-10-CM | POA: Diagnosis not present

## 2018-11-20 DIAGNOSIS — F4310 Post-traumatic stress disorder, unspecified: Secondary | ICD-10-CM | POA: Diagnosis not present

## 2018-11-20 DIAGNOSIS — F411 Generalized anxiety disorder: Secondary | ICD-10-CM | POA: Diagnosis not present

## 2018-11-24 ENCOUNTER — Encounter (HOSPITAL_COMMUNITY): Payer: Self-pay | Admitting: Licensed Clinical Social Worker

## 2018-11-24 ENCOUNTER — Other Ambulatory Visit: Payer: Self-pay

## 2018-11-24 ENCOUNTER — Ambulatory Visit (INDEPENDENT_AMBULATORY_CARE_PROVIDER_SITE_OTHER): Payer: Medicare HMO | Admitting: Licensed Clinical Social Worker

## 2018-11-24 DIAGNOSIS — F331 Major depressive disorder, recurrent, moderate: Secondary | ICD-10-CM

## 2018-11-24 DIAGNOSIS — F4310 Post-traumatic stress disorder, unspecified: Secondary | ICD-10-CM | POA: Diagnosis not present

## 2018-11-24 DIAGNOSIS — F411 Generalized anxiety disorder: Secondary | ICD-10-CM

## 2018-11-24 NOTE — Progress Notes (Signed)
Virtual Visit via Video Note  I connected with Mark [name] on 11/20/18 at 3:00pm by a video enabled telemedicine application and verified that I am speaking with the correct person using two identifiers.   I discussed the limitations of evaluation and management by telemedicine and the availability of in person appointments. The patient expressed understanding and agreed to proceed.  History of Present Illness:Pt was referred to [facility] OP therapy for PTSD, depression and anxiety by the [facility].    Observations/Objective: Verbalize positive and hopeful feelings of the future. Reduce irritability. Pt discussed his psychiatric symptoms and current life events. Patient presents anxious today for his webex appointment. He and his wife had their first marriage counseling appointment. Used socratic questions. Used CBT to assist patient with his feelings and thoughts during the marriage counseling session, identifying his negative thoughts and reframing them.  Assessment and Plan: .See patient weekly individually and in veteran's group. Discussed with pt his desire to come into the office for therapy. "At this time I don't want to come in except maybe for the group."  Follow Up Instructions: I will send webex invitation weekly before each individual and group session.   I discussed the assessment and treatment plan with the patient. The patient was provided an opportunity to ask questions and all were answered. The patient agreed with the plan and demonstrated an understanding of the instructions.   The patient was advised to call back or seek an in-person evaluation if the symptoms worsen or if the condition fails to improve as anticipated.  I provided 60 minutes of non-face-to-face time during this encounter.   Wenceslao Loper S, LCAS  .

## 2018-11-24 NOTE — Progress Notes (Signed)
Virtual Visit via Video Note I connected with Mark [name] on 11/24/18 at  5:00 PM EST by a video enabled telemedicine application and verified that I am speaking with the correct person using two identifiers.  Location: Patient: Mark [name] Provider: Alver Fisher, LCAS   I discussed the limitations of evaluation and management by telemedicine and the availability of in person appointments. The patient expressed understanding and agreed to proceed.  History of Present Illness: Pt is referred to therapy for depression and anxiety by [facility].   Observations/Objective:                Patient participated in a discussion on believing in yourself, having faith in your own capabilities. When you believe in yourself, you can overcome self-doubt and have the confidence to take action and get things done.  Patient was encouraged to continue believing in himself.  Assessment and Plan:  Continue to see patient 2 x per week (group and individual).    Follow Up Instructions:  Counselor will send Webex link for the next session. I discussed the assessment and treatment plan with the patient. The patient was provided an opportunity to ask questions and all were answered. The patient agreed with the plan and demonstrated an understanding of the instructions.   The patient was advised to call back or seek an in-person evaluation if the symptoms worsen or if the condition fails to improve as anticipated.  I provided 120 minutes of non-face-to-face time during this encounter.   Kali Ambler S, LCAS

## 2018-11-26 ENCOUNTER — Ambulatory Visit (INDEPENDENT_AMBULATORY_CARE_PROVIDER_SITE_OTHER): Payer: Medicare HMO | Admitting: Licensed Clinical Social Worker

## 2018-11-26 ENCOUNTER — Other Ambulatory Visit: Payer: Self-pay

## 2018-11-26 ENCOUNTER — Encounter (HOSPITAL_COMMUNITY): Payer: Self-pay | Admitting: Licensed Clinical Social Worker

## 2018-11-26 DIAGNOSIS — F411 Generalized anxiety disorder: Secondary | ICD-10-CM

## 2018-11-26 DIAGNOSIS — F331 Major depressive disorder, recurrent, moderate: Secondary | ICD-10-CM | POA: Diagnosis not present

## 2018-11-26 DIAGNOSIS — F4310 Post-traumatic stress disorder, unspecified: Secondary | ICD-10-CM | POA: Diagnosis not present

## 2018-11-26 NOTE — Progress Notes (Signed)
Virtual Visit via Video Note  I connected with Mark [name] on 11/26/18 at 4:00pm by a video enabled telemedicine application and verified that I am speaking with the correct person using two identifiers.   I discussed the limitations of evaluation and management by telemedicine and the availability of in person appointments. The patient expressed understanding and agreed to proceed.  History of Present Illness:Pt was referred to [facility] OP therapy for PTSD, depression and anxiety by the [facility].    Observations/Objective: Verbalize positive and hopeful feelings of the future. Reduce irritability. Pt discussed his psychiatric symptoms and current life events. Patient presents depressed today for his webex appointment. He has been dx with stage 1 kidney failure. He has to make lifestyle changes and is working with a dr at the [place] (Currently) who will make a tx plan and referrals. Used socratic questions. Reviewed treatment plan with pt who verbalized his acceptance of the tx plan. Assisted patient with how to talk with his family about his diagnosis.  Assessment and Plan: .See patient weekly individually and in veteran's group. Discussed with pt his desire to come into the office for therapy. "At this time I don't want to come in except maybe for the group."  Follow Up Instructions: I will send webex invitation weekly before each individual and group session.   I discussed the assessment and treatment plan with the patient. The patient was provided an opportunity to ask questions and all were answered. The patient agreed with the plan and demonstrated an understanding of the instructions.   The patient was advised to call back or seek an in-person evaluation if the symptoms worsen or if the condition fails to improve as anticipated.  I provided 60 minutes of non-face-to-face time during this encounter.   MACKENZIE,LISBETH S, LCAS  .

## 2018-12-01 ENCOUNTER — Encounter (HOSPITAL_COMMUNITY): Payer: Self-pay | Admitting: Licensed Clinical Social Worker

## 2018-12-01 ENCOUNTER — Other Ambulatory Visit: Payer: Self-pay

## 2018-12-01 ENCOUNTER — Ambulatory Visit (INDEPENDENT_AMBULATORY_CARE_PROVIDER_SITE_OTHER): Payer: Medicare HMO | Admitting: Licensed Clinical Social Worker

## 2018-12-01 DIAGNOSIS — F331 Major depressive disorder, recurrent, moderate: Secondary | ICD-10-CM

## 2018-12-01 DIAGNOSIS — F411 Generalized anxiety disorder: Secondary | ICD-10-CM

## 2018-12-01 DIAGNOSIS — F4310 Post-traumatic stress disorder, unspecified: Secondary | ICD-10-CM | POA: Diagnosis not present

## 2018-12-01 NOTE — Progress Notes (Signed)
Virtual Visit via Video Note I connected with Mark [name] on 12/01/18 at  5:00 PM EST by a video enabled telemedicine application and verified that I am speaking with the correct person using two identifiers.  Location: Patient: Mark [name] Provider:  , LCAS   I discussed the limitations of evaluation and management by telemedicine and the availability of in person appointments. The patient expressed understanding and agreed to proceed.  History of Present Illness: Pt is referred to therapy for depression and anxiety by [facility].   Observations/Objective:               Patient participated in a discussion on communication, different styles, asking for needs to be met, and protecting self. Patient was encouraged to use effective communication when asking for needs to be met and use self care.   Assessment and Plan:  Continue to see patient 2 x per week (group and individual).    Follow Up Instructions:  Counselor will send Webex link for the next session. I discussed the assessment and treatment plan with the patient. The patient was provided an opportunity to ask questions and all were answered. The patient agreed with the plan and demonstrated an understanding of the instructions.   The patient was advised to call back or seek an in-person evaluation if the symptoms worsen or if the condition fails to improve as anticipated.  I provided 90 minutes of non-face-to-face time during this encounter.   , S, LCAS  

## 2018-12-03 ENCOUNTER — Ambulatory Visit (INDEPENDENT_AMBULATORY_CARE_PROVIDER_SITE_OTHER): Payer: Medicare HMO | Admitting: Licensed Clinical Social Worker

## 2018-12-03 ENCOUNTER — Encounter (HOSPITAL_COMMUNITY): Payer: Self-pay | Admitting: Licensed Clinical Social Worker

## 2018-12-03 ENCOUNTER — Other Ambulatory Visit: Payer: Self-pay

## 2018-12-03 DIAGNOSIS — F4310 Post-traumatic stress disorder, unspecified: Secondary | ICD-10-CM

## 2018-12-03 DIAGNOSIS — F411 Generalized anxiety disorder: Secondary | ICD-10-CM | POA: Diagnosis not present

## 2018-12-03 DIAGNOSIS — F331 Major depressive disorder, recurrent, moderate: Secondary | ICD-10-CM | POA: Diagnosis not present

## 2018-12-03 NOTE — Progress Notes (Signed)
Virtual Visit via Video Note  I connected with Mark [name] on 12/03/18 by video enabled telemedicine with the correct person using two identifiers.   I discussed the limitations of evaluation and management by telemedicine and the availability of in person appointments. The patient expressed understanding and agreed to proceed.  History of Present Illness:Pt was referred to [facility] OP therapy for PTSD, depression and anxiety by the [facility].     Observations/Objective: Verbalize positive and hopeful feelings of the future. Reduce irritability. Pt discussed his psychiatric symptoms and current life events. Patient presents depressed today for his webex appointment. Patient discussed his new dx of stage 1 kidney failure, treatment plan. He discussed his negative thoughts about the diagnosis. Used CBT to assist patient challenging and changing his unhelpful cognitive distortions, improving emotional regulation, and the development of his coping strategies.   Assessment and Plan: .See patient weekly individually and in veteran's group. Discussed with pt his desire to come into the office for therapy. "At this time I don't want to come in except maybe for the group."  Follow Up Instructions: I will send webex invitation weekly before each individual and group session.   I discussed the assessment and treatment plan with the patient. The patient was provided an opportunity to ask questions and all were answered. The patient agreed with the plan and demonstrated an understanding of the instructions.   The patient was advised to call back or seek an in-person evaluation if the symptoms worsen or if the condition fails to improve as anticipated.  I provided 60 minutes of non-face-to-face time during this encounter.   Teliah Buffalo S, LCAS  .

## 2018-12-08 ENCOUNTER — Other Ambulatory Visit: Payer: Self-pay

## 2018-12-08 ENCOUNTER — Ambulatory Visit (INDEPENDENT_AMBULATORY_CARE_PROVIDER_SITE_OTHER): Payer: Medicare HMO | Admitting: Licensed Clinical Social Worker

## 2018-12-08 ENCOUNTER — Encounter (HOSPITAL_COMMUNITY): Payer: Self-pay | Admitting: Licensed Clinical Social Worker

## 2018-12-08 DIAGNOSIS — F411 Generalized anxiety disorder: Secondary | ICD-10-CM | POA: Diagnosis not present

## 2018-12-08 DIAGNOSIS — F331 Major depressive disorder, recurrent, moderate: Secondary | ICD-10-CM

## 2018-12-08 DIAGNOSIS — F4310 Post-traumatic stress disorder, unspecified: Secondary | ICD-10-CM | POA: Diagnosis not present

## 2018-12-08 NOTE — Progress Notes (Signed)
Virtual Visit via Video Note I connected with Mark [name] on 12/08/18 at  5:00 PM EST by a video enabled telemedicine application and verified that I am speaking with the correct person using two identifiers.  Location: Patient: Mark [name] Provider: Alver Fisher, LCAS   I discussed the limitations of evaluation and management by telemedicine and the availability of in person appointments. The patient expressed understanding and agreed to proceed.  History of Present Illness: Pt is referred to therapy for depression and anxiety by [facility].  Observation/ObjectivePatient participated in a discussion on :the benefit of being vulnerable while sharing in group: Ask for what you need, Be willing to expose your feelings. Say what you want. Express what you really think. Slow down and be present. Patient was encouraged to show his vulnerability while sharing in group.    Assessment and Plan:  Continue to see patient 2 x per week (group and individual).    Follow Up Instructions:  Counselor will send Webex link for the next session. I discussed the assessment and treatment plan with the patient. The patient was provided an opportunity to ask questions and all were answered. The patient agreed with the plan and demonstrated an understanding of the instructions.   The patient was advised to call back or seek an in-person evaluation if the symptoms worsen or if the condition fails to improve as anticipated.  I provided 120 minutes of non-face-to-face time during this encounter.   MACKENZIE,LISBETH S, LCAS

## 2018-12-11 ENCOUNTER — Other Ambulatory Visit: Payer: Self-pay

## 2018-12-11 ENCOUNTER — Encounter (HOSPITAL_COMMUNITY): Payer: Self-pay | Admitting: Licensed Clinical Social Worker

## 2018-12-11 ENCOUNTER — Ambulatory Visit (INDEPENDENT_AMBULATORY_CARE_PROVIDER_SITE_OTHER): Payer: Medicare HMO | Admitting: Licensed Clinical Social Worker

## 2018-12-11 DIAGNOSIS — F411 Generalized anxiety disorder: Secondary | ICD-10-CM | POA: Diagnosis not present

## 2018-12-11 DIAGNOSIS — F4310 Post-traumatic stress disorder, unspecified: Secondary | ICD-10-CM | POA: Diagnosis not present

## 2018-12-11 DIAGNOSIS — F331 Major depressive disorder, recurrent, moderate: Secondary | ICD-10-CM | POA: Diagnosis not present

## 2018-12-11 NOTE — Progress Notes (Signed)
Virtual Visit via Video Note  I connected with Mark [name] on 12/03/18 by video enabled telemedicine with the correct person using two identifiers.   I discussed the limitations of evaluation and management by telemedicine and the availability of in person appointments. The patient expressed understanding and agreed to proceed.  History of Present Illness:Pt was referred to [facility] OP therapy for PTSD, depression and anxiety by the [facility].     Observations/Objective: Verbalize positive and hopeful feelings of the future. Reduce irritability. Pt discussed his psychiatric symptoms and current life events. Patient presents depressed and anxious for his webex appointment. Patient has gone home to [place] to spend thanksgiving with his family. His depressive symptoms have increased due to the continued family dysfunction. Assessed family dynamics. Assisted patient by role playing scenarios that may come up. Patient reports, "I'm waiting for the other shoe to drop. My anxiety has increased." Coached patient on coping skills, grounding techniques.   Assessment and Plan: .See patient weekly individually and in veteran's group. Discussed with pt his desire to come into the office for therapy. "At this time I don't want to come in except maybe for the group."  Follow Up Instructions: I will send webex invitation weekly before each individual and group session.   I discussed the assessment and treatment plan with the patient. The patient was provided an opportunity to ask questions and all were answered. The patient agreed with the plan and demonstrated an understanding of the instructions.   The patient was advised to call back or seek an in-person evaluation if the symptoms worsen or if the condition fails to improve as anticipated.  I provided 60 minutes of non-face-to-face time during this encounter.   Scottlynn Lindell S, LCAS  .

## 2018-12-15 ENCOUNTER — Encounter (HOSPITAL_COMMUNITY): Payer: Self-pay | Admitting: Licensed Clinical Social Worker

## 2018-12-15 ENCOUNTER — Ambulatory Visit (INDEPENDENT_AMBULATORY_CARE_PROVIDER_SITE_OTHER): Payer: No Typology Code available for payment source | Admitting: Licensed Clinical Social Worker

## 2018-12-15 ENCOUNTER — Other Ambulatory Visit: Payer: Self-pay

## 2018-12-15 DIAGNOSIS — F4310 Post-traumatic stress disorder, unspecified: Secondary | ICD-10-CM | POA: Diagnosis not present

## 2018-12-15 DIAGNOSIS — F332 Major depressive disorder, recurrent severe without psychotic features: Secondary | ICD-10-CM | POA: Diagnosis not present

## 2018-12-15 DIAGNOSIS — F411 Generalized anxiety disorder: Secondary | ICD-10-CM

## 2018-12-15 NOTE — Progress Notes (Signed)
Virtual Visit via Video Note I connected with Mark [name] on 12/15/18 at  5:00 PM EST by a video enabled telemedicine application and verified that I am speaking with the correct person using two identifiers.  Location: Patient: Mark [name] Provider: Alver Fisher, LCAS   I discussed the limitations of evaluation and management by telemedicine and the availability of in person appointments. The patient expressed understanding and agreed to proceed.  History of Present Illness: Pt is referred to therapy for depression and anxiety by [facility]  Observation/ObjectivePatient participated in a discussion on :the benefit of being vulnerable while sharing in group: Ask for what you need, Be willing to expose your feelings. Say what you want. Express what you really think. Slow down and be present. Patient was encouraged to show his vulnerability while sharing in group.    Assessment and Plan:  Continue to see patient 2 x per week (group and individual).    Follow Up Instructions:  Counselor will send Webex link for the next session. I discussed the assessment and treatment plan with the patient. The patient was provided an opportunity to ask questions and all were answered. The patient agreed with the plan and demonstrated an understanding of the instructions.   The patient was advised to call back or seek an in-person evaluation if the symptoms worsen or if the condition fails to improve as anticipated.  I provided 60 minutes of non-face-to-face time during this encounter.   MACKENZIE,LISBETH S, LCAS

## 2018-12-17 ENCOUNTER — Encounter (HOSPITAL_COMMUNITY): Payer: Self-pay | Admitting: Licensed Clinical Social Worker

## 2018-12-17 ENCOUNTER — Other Ambulatory Visit: Payer: Self-pay

## 2018-12-17 ENCOUNTER — Ambulatory Visit (INDEPENDENT_AMBULATORY_CARE_PROVIDER_SITE_OTHER): Payer: No Typology Code available for payment source | Admitting: Licensed Clinical Social Worker

## 2018-12-17 DIAGNOSIS — F4310 Post-traumatic stress disorder, unspecified: Secondary | ICD-10-CM | POA: Diagnosis not present

## 2018-12-17 DIAGNOSIS — F332 Major depressive disorder, recurrent severe without psychotic features: Secondary | ICD-10-CM

## 2018-12-17 DIAGNOSIS — F411 Generalized anxiety disorder: Secondary | ICD-10-CM

## 2018-12-17 NOTE — Progress Notes (Signed)
Virtual Visit via Video Note  I connected with [name] on 12/03/18 by video enabled telemedicine with the correct person using two identifiers.   I discussed the limitations of evaluation and management by telemedicine and the availability of in person appointments. The patient expressed understanding and agreed to proceed.  History of Present Illness:Pt was referred to [facility] OP therapy for PTSD, depression and anxiety by the [facility].     Observations/Objective: Verbalize positive and hopeful feelings of the future. Reduce irritability. Pt discussed his psychiatric symptoms and current life events. Patient presents depressed and anxious for his webex appointment. Patient has gone home to [place] to spend thanksgiving with his family. His depressive symptoms have increased due to the continued family dysfunction. Assessed family dynamics and discussed ways to maintain family function while in [place]. "My anxiety has increased." Coached patient on coping skills, grounding techniques.   Assessment and Plan: .See patient weekly individually and in veteran's group. Discussed with pt his desire to come into the office for therapy. "At this time I don't want to come in except maybe for the group."  Follow Up Instructions: I will send webex invitation weekly before each individual and group session.   I discussed the assessment and treatment plan with the patient. The patient was provided an opportunity to ask questions and all were answered. The patient agreed with the plan and demonstrated an understanding of the instructions.   The patient was advised to call back or seek an in-person evaluation if the symptoms worsen or if the condition fails to improve as anticipated.  I provided 60 minutes of non-face-to-face time during this encounter.   MACKENZIE,LISBETH S, LCAS  .

## 2018-12-22 ENCOUNTER — Ambulatory Visit (INDEPENDENT_AMBULATORY_CARE_PROVIDER_SITE_OTHER): Payer: No Typology Code available for payment source | Admitting: Licensed Clinical Social Worker

## 2018-12-22 ENCOUNTER — Encounter (HOSPITAL_COMMUNITY): Payer: Self-pay | Admitting: Licensed Clinical Social Worker

## 2018-12-22 ENCOUNTER — Other Ambulatory Visit: Payer: Self-pay

## 2018-12-22 DIAGNOSIS — F331 Major depressive disorder, recurrent, moderate: Secondary | ICD-10-CM

## 2018-12-22 DIAGNOSIS — F4310 Post-traumatic stress disorder, unspecified: Secondary | ICD-10-CM

## 2018-12-22 DIAGNOSIS — F411 Generalized anxiety disorder: Secondary | ICD-10-CM

## 2018-12-22 NOTE — Progress Notes (Signed)
Virtual Visit via Video Note I connected with Mark [name] on 12/22/18 at  5:00 PM EST by a video enabled telemedicine application and verified that I am speaking with the correct person using two identifiers.  Location: Patient: Mark [name] Provider: Alver Fisher, LCAS   I discussed the limitations of evaluation and management by telemedicine and the availability of in person appointments. The patient expressed understanding and agreed to proceed.  History of Present Illness: Pt is referred to therapy for depression and anxiety by [facility].  Observation/Objective Patient participated in a discussion on changing emotional responses, regulating emotions effectively and changing emotional responses. Patient discussed his current emotions that stemmed from the Thanksgiving holiday.  Encouraged patient to pause and check the facts before changing emotional responses.  Assessment and Plan:  Continue to see patient 2 x per week (group and individual).    Follow Up Instructions:  Counselor will send Webex link for the next session. I discussed the assessment and treatment plan with the patient. The patient was provided an opportunity to ask questions and all were answered. The patient agreed with the plan and demonstrated an understanding of the instructions.   The patient was advised to call back or seek an in-person evaluation if the symptoms worsen or if the condition fails to improve as anticipated.  I provided 120 minutes of non-face-to-face time during this encounter.   Derran Sear S, LCAS

## 2018-12-24 ENCOUNTER — Encounter (HOSPITAL_COMMUNITY): Payer: Self-pay | Admitting: Licensed Clinical Social Worker

## 2018-12-24 ENCOUNTER — Ambulatory Visit (INDEPENDENT_AMBULATORY_CARE_PROVIDER_SITE_OTHER): Payer: No Typology Code available for payment source | Admitting: Licensed Clinical Social Worker

## 2018-12-24 ENCOUNTER — Other Ambulatory Visit: Payer: Self-pay

## 2018-12-24 DIAGNOSIS — F331 Major depressive disorder, recurrent, moderate: Secondary | ICD-10-CM

## 2018-12-24 DIAGNOSIS — F411 Generalized anxiety disorder: Secondary | ICD-10-CM | POA: Diagnosis not present

## 2018-12-24 DIAGNOSIS — F4310 Post-traumatic stress disorder, unspecified: Secondary | ICD-10-CM

## 2018-12-24 NOTE — Progress Notes (Signed)
Virtual Visit via Video Note  I connected with Mark [name] on 12/24/18 at 4:00pm EST by video enabled telemedicine with the correct person using two identifiers.   I discussed the limitations of evaluation and management by telemedicine and the availability of in person appointments. The patient expressed understanding and agreed to proceed.  History of Present Illness:Pt was referred to [facility] OP therapy for PTSD, depression and anxiety by the [facility].     Observations/Objective: Verbalize positive and hopeful feelings of the future. Reduce irritability. Pt discussed his psychiatric symptoms and current life events. Patient presents depressed and anxious for his webex appointment.  Patient has returned from going home to [place] to spend thanksgiving with his family.Patient discussed his psychiatric symptoms increased while he was in [place]. Used Socratic questions. Coached patient on coping skills to assist  with his increased symptoms.  Assessment and Plan: .See patient weekly individually and in veteran's group. Discussed with pt his desire to come into the office for therapy. "At this time I don't want to come in except maybe for the group."  Follow Up Instructions: Counselor will send webex invitation weekly before each individual and group session.   I discussed the assessment and treatment plan with the patient. The patient was provided an opportunity to ask questions and all were answered. The patient agreed with the plan and demonstrated an understanding of the instructions.   The patient was advised to call back or seek an in-person evaluation if the symptoms worsen or if the condition fails to improve as anticipated.  I provided 60 minutes of non-face-to-face time during this encounter.   MACKENZIE,LISBETH S, LCAS  .

## 2018-12-29 ENCOUNTER — Other Ambulatory Visit: Payer: Self-pay

## 2018-12-29 ENCOUNTER — Ambulatory Visit (HOSPITAL_COMMUNITY): Payer: No Typology Code available for payment source | Admitting: Licensed Clinical Social Worker

## 2018-12-31 ENCOUNTER — Other Ambulatory Visit: Payer: Self-pay

## 2018-12-31 ENCOUNTER — Ambulatory Visit (INDEPENDENT_AMBULATORY_CARE_PROVIDER_SITE_OTHER): Payer: No Typology Code available for payment source | Admitting: Licensed Clinical Social Worker

## 2018-12-31 ENCOUNTER — Encounter (HOSPITAL_COMMUNITY): Payer: Self-pay | Admitting: Licensed Clinical Social Worker

## 2018-12-31 DIAGNOSIS — F411 Generalized anxiety disorder: Secondary | ICD-10-CM | POA: Diagnosis not present

## 2018-12-31 DIAGNOSIS — F331 Major depressive disorder, recurrent, moderate: Secondary | ICD-10-CM | POA: Diagnosis not present

## 2018-12-31 DIAGNOSIS — F4310 Post-traumatic stress disorder, unspecified: Secondary | ICD-10-CM | POA: Diagnosis not present

## 2018-12-31 NOTE — Progress Notes (Addendum)
Virtual Visit via Video Note  I connected with Mark [name] on 12/31/18 at 11:30pm EST by video enabled telemedicine with the correct person using two identifiers.   I discussed the limitations of evaluation and management by telemedicine and the availability of in person appointments. The patient expressed understanding and agreed to proceed.  History of Present Illness:Pt was referred to [facility] OP therapy for PTSD, depression and anxiety by the [facility]     Observations/Objective: Verbalize positive and hopeful feelings of the future. Reduce irritability. Pt discussed his psychiatric symptoms and current life events. Patient presents depressed, anxious and in bed for his webex appointment. Patient inquired about going to an inpatient facility. Within the last week "my emotions have been all over the place from extreme depression and anxiety. My physical health is not good either, my sugar and blood pressure are out of control." Patient is going to the [place] today or tomorrow for lab work and talk with his dr. Burnard Bunting to patient he get his physical health stable. Explained the process of going in patient, having his physical health stabilized. Discussed his symptoms of depression and anxiety, and coping skills he's using. Discussed concerns of his health and mental health.   Assessment and Plan: .See patient weekly individually and in veteran's group. Discussed with pt his desire to come into the office for therapy. "At this time I don't want to come in except maybe for the group."  Follow Up Instructions: Counselor will send webex invitation weekly before each individual and group session.   I discussed the assessment and treatment plan with the patient. The patient was provided an opportunity to ask questions and all were answered. The patient agreed with the plan and demonstrated an understanding of the instructions.   The patient was advised to call back or seek an in-person evaluation if  the symptoms worsen or if the condition fails to improve as anticipated.  I provided 40 minutes of non-face-to-face time during this encounter.   MACKENZIE,LISBETH S, LCAS  .

## 2019-01-05 ENCOUNTER — Other Ambulatory Visit: Payer: Self-pay

## 2019-01-05 ENCOUNTER — Ambulatory Visit (INDEPENDENT_AMBULATORY_CARE_PROVIDER_SITE_OTHER): Payer: No Typology Code available for payment source | Admitting: Licensed Clinical Social Worker

## 2019-01-05 ENCOUNTER — Encounter (HOSPITAL_COMMUNITY): Payer: Self-pay | Admitting: Licensed Clinical Social Worker

## 2019-01-05 DIAGNOSIS — F4310 Post-traumatic stress disorder, unspecified: Secondary | ICD-10-CM | POA: Diagnosis not present

## 2019-01-05 DIAGNOSIS — F411 Generalized anxiety disorder: Secondary | ICD-10-CM

## 2019-01-05 DIAGNOSIS — F331 Major depressive disorder, recurrent, moderate: Secondary | ICD-10-CM | POA: Diagnosis not present

## 2019-01-05 NOTE — Progress Notes (Signed)
Virtual Visit via Video Note I connected with Mark [name] on 01/05/19 at  5:00 PM EST by a video enabled telemedicine application and verified that I am speaking with the correct person using two identifiers.  Location: Patient: Mark [name] Provider: Alver Fisher, LCAS   I discussed the limitations of evaluation and management by telemedicine and the availability of in person appointments. The patient expressed understanding and agreed to proceed.  History of Present Illness: Pt is referred to therapy for depression and anxiety by [facility].  Observation/Objective Patient participated in a discussion on "Diversion Strategies" as a coping skill during periods of distressed emotions. Suggested patient use coping strategies to assist with periods of distressed emotions.  Assessment and Plan:  Counselor will continue to meet with patient to address treatment plan goals. Patient will continue to follow recommendations of providers and implement skills learned in session.   Follow Up Instructions:  Counselor will send Webex link for the next session. I discussed the assessment and treatment plan with the patient. The patient was provided an opportunity to ask questions and all were answered. The patient agreed with the plan and demonstrated an understanding of the instructions.   The patient was advised to call back or seek an in-person evaluation if the symptoms worsen or if the condition fails to improve as anticipated.  I provided 120 minutes of non-face-to-face time during this encounter.   Rasheida Broden S, LCAS

## 2019-01-08 ENCOUNTER — Other Ambulatory Visit: Payer: Self-pay

## 2019-01-08 ENCOUNTER — Ambulatory Visit (HOSPITAL_COMMUNITY): Payer: No Typology Code available for payment source | Admitting: Licensed Clinical Social Worker

## 2019-01-12 ENCOUNTER — Ambulatory Visit (INDEPENDENT_AMBULATORY_CARE_PROVIDER_SITE_OTHER): Payer: Medicare HMO | Admitting: Licensed Clinical Social Worker

## 2019-01-12 ENCOUNTER — Encounter (HOSPITAL_COMMUNITY): Payer: Self-pay | Admitting: Licensed Clinical Social Worker

## 2019-01-12 ENCOUNTER — Other Ambulatory Visit: Payer: Self-pay

## 2019-01-12 DIAGNOSIS — F411 Generalized anxiety disorder: Secondary | ICD-10-CM | POA: Diagnosis not present

## 2019-01-12 DIAGNOSIS — F331 Major depressive disorder, recurrent, moderate: Secondary | ICD-10-CM

## 2019-01-12 DIAGNOSIS — F4310 Post-traumatic stress disorder, unspecified: Secondary | ICD-10-CM | POA: Diagnosis not present

## 2019-01-12 NOTE — Progress Notes (Signed)
Virtual Visit via Video Note I connected with Mark [name] on 01/12/19 at  5:00 PM EST by a video enabled telemedicine application and verified that I am speaking with the correct person using two identifiers.  Location: Patient: Mark [name] Provider: Alver Fisher, LCAS   I discussed the limitations of evaluation and management by telemedicine and the availability of in person appointments. The patient expressed understanding and agreed to proceed.  History of Present Illness: Pt is referred to therapy for depression and anxiety by [facility].  Observation/Objective Patient participated in a discussion on changing emotional responses. Patient's moods are still low, sleeping all the time. He went to the [facility] Friday for blood work and has no results. Patient needs to be medically stable before his mental health needs can be stabilized.     Assessment and Plan:  Counselor will continue to meet with patient to address treatment plan goals. Patient will continue to follow recommendations of providers and implement skills learned in session.   Follow Up Instructions:  Counselor will send Webex link for the next session. I discussed the assessment and treatment plan with the patient. The patient was provided an opportunity to ask questions and all were answered. The patient agreed with the plan and demonstrated an understanding of the instructions.   The patient was advised to call back or seek an in-person evaluation if the symptoms worsen or if the condition fails to improve as anticipated.  I provided 120 minutes of non-face-to-face time during this encounter.   Bristyl Mclees S, LCAS

## 2019-01-13 ENCOUNTER — Ambulatory Visit (INDEPENDENT_AMBULATORY_CARE_PROVIDER_SITE_OTHER): Payer: No Typology Code available for payment source | Admitting: Licensed Clinical Social Worker

## 2019-01-13 ENCOUNTER — Other Ambulatory Visit: Payer: Self-pay

## 2019-01-13 ENCOUNTER — Encounter (HOSPITAL_COMMUNITY): Payer: Self-pay | Admitting: Licensed Clinical Social Worker

## 2019-01-13 DIAGNOSIS — F411 Generalized anxiety disorder: Secondary | ICD-10-CM

## 2019-01-13 DIAGNOSIS — F4310 Post-traumatic stress disorder, unspecified: Secondary | ICD-10-CM

## 2019-01-13 DIAGNOSIS — F331 Major depressive disorder, recurrent, moderate: Secondary | ICD-10-CM

## 2019-01-13 NOTE — Progress Notes (Signed)
Virtual Visit via Video Note  I connected with Mark [name] on 01/13/19 at 3:00pm EST by video enabled telemedicine with the correct person using two identifiers.   I discussed the limitations of evaluation and management by telemedicine and the availability of in person appointments. The patient expressed understanding and agreed to proceed.  History of Present Illness:Mark [name] was referred to [facility] OP therapy for PTSD, depression and anxiety by the [facility].     Observations/Objective: Verbalize positive and hopeful feelings of the future. Reduce irritability. Mark [name] discussed his psychiatric symptoms and current life events. Patient presents depressed with feelings of hopelessness, agitation, sadness, sleeping too much. Patient reviewed his lab work during session from the [place], which showed some deference in lab results. He hopes to speak to his PCP by phone tonight. Patient discussed his reasons for needing to go home over Christmas. Asked open ended questions, quantifying necessity vs self preservation. Continue to express to patient the need for him to get his physical health stabilized. Patient was in agreement. Gave patient crisis hot lines and emergency #s.      Assessment and Plan: .See patient weekly individually and in veteran's group. Discussed with Mark [name] his desire to come into the office for therapy. "At this time I don't want to come in except maybe for the group."  Follow Up Instructions: Counselor will send webex invitation weekly before each individual and group session.   I discussed the assessment and treatment plan with the patient. The patient was provided an opportunity to ask questions and all were answered. The patient agreed with the plan and demonstrated an understanding of the instructions.   The patient was advised to call back or seek an in-person evaluation if the symptoms worsen or if the condition fails to improve as anticipated.  I provided 60 minutes of  non-face-to-face time during this encounter.   Mark [name] S, LCAS  .

## 2019-01-19 ENCOUNTER — Encounter (HOSPITAL_COMMUNITY): Payer: Self-pay | Admitting: Licensed Clinical Social Worker

## 2019-01-19 ENCOUNTER — Ambulatory Visit (INDEPENDENT_AMBULATORY_CARE_PROVIDER_SITE_OTHER): Payer: Medicare HMO | Admitting: Licensed Clinical Social Worker

## 2019-01-19 ENCOUNTER — Other Ambulatory Visit: Payer: Self-pay

## 2019-01-19 DIAGNOSIS — F331 Major depressive disorder, recurrent, moderate: Secondary | ICD-10-CM

## 2019-01-19 DIAGNOSIS — F411 Generalized anxiety disorder: Secondary | ICD-10-CM | POA: Diagnosis not present

## 2019-01-19 DIAGNOSIS — F4310 Post-traumatic stress disorder, unspecified: Secondary | ICD-10-CM

## 2019-01-19 NOTE — Progress Notes (Signed)
Virtual Visit via Video Note  I connected with Mark [name] on 01/19/19 at  5:00 PM EST by a video enabled telemedicine application and verified that I am speaking with the correct person using two identifiers.   I discussed the limitations of evaluation and management by telemedicine and the availability of in person appointments. The patient expressed understanding and agreed to proceed.  History of Present Illness: [facility], [facility], [facility].    Observations/Objective: Patient participated in a discussion on "tolerating the holidays" with family members. Patient described his holiday and how he dealt with the feelings surrounding the day. Patient was encouraged to express his feelings in positive ways.   Assessment and Plan: Counselor will continue to meet with patient to address treatment plan goals. Patient will continue to follow recommendations of providers and implement skills learned in session.   Follow Up Instructions: I discussed the assessment and treatment plan with the patient. The patient was provided an opportunity to ask questions and all were answered. The patient agreed with the plan and demonstrated an understanding of the instructions.   The patient was advised to call back or seek an in-person evaluation if the symptoms worsen or if the condition fails to improve as anticipated.  I provided 120 minutes of non-face-to-face time during this encounter.   Janus Vlcek S, LCAS

## 2019-01-20 ENCOUNTER — Other Ambulatory Visit: Payer: Self-pay

## 2019-01-20 ENCOUNTER — Encounter (HOSPITAL_COMMUNITY): Payer: Self-pay | Admitting: Licensed Clinical Social Worker

## 2019-01-20 ENCOUNTER — Ambulatory Visit (INDEPENDENT_AMBULATORY_CARE_PROVIDER_SITE_OTHER): Payer: Medicare HMO | Admitting: Licensed Clinical Social Worker

## 2019-01-20 DIAGNOSIS — F4310 Post-traumatic stress disorder, unspecified: Secondary | ICD-10-CM

## 2019-01-20 DIAGNOSIS — F411 Generalized anxiety disorder: Secondary | ICD-10-CM | POA: Diagnosis not present

## 2019-01-20 DIAGNOSIS — F331 Major depressive disorder, recurrent, moderate: Secondary | ICD-10-CM | POA: Diagnosis not present

## 2019-01-20 NOTE — Progress Notes (Signed)
Virtual Visit via Phone Note  I connected with Jess Toney on 01/13/19 at 3:00pm EST by phone enabled telemedicine with the correct person using two identifiers.   I discussed the limitations of evaluation and management by telemedicine and the availability of in person appointments. The patient expressed understanding and agreed to proceed.  History of Present Illness:Pt was referred to [facility] OP therapy for PTSD, depression and anxiety by the [facility].     Observations/Objective: Verbalize positive and hopeful feelings of the future. Reduce irritability. Pt discussed his psychiatric symptoms and current life events. Patient presents depressed, driving back from [place] visiting family for the holidays. Patient seems resolved that his marriage is over. Used Socratic questions. Patient discuss his goals for the new year, "what are you willing to do different to have a happier life?" Patient continues having more physical tests to assess his current medical issues: tired, sleeping constantly.Discussed his depressive symptoms.  Assessment and Plan: Counselor will continue to meet with patient to address treatment plan goals. Patient will continue to follow recommendations of providers and implement skills learned in session.  Follow Up Instructions: Counselor will send webex invitation weekly before each individual and group session.   I discussed the assessment and treatment plan with the patient. The patient was provided an opportunity to ask questions and all were answered. The patient agreed with the plan and demonstrated an understanding of the instructions.   The patient was advised to call back or seek an in-person evaluation if the symptoms worsen or if the condition fails to improve as anticipated.  I provided 60 minutes of non-face-to-face time during this encounter.   Aime Meloche S, LCAS  .

## 2019-01-26 ENCOUNTER — Encounter (HOSPITAL_COMMUNITY): Payer: Self-pay | Admitting: Licensed Clinical Social Worker

## 2019-01-26 ENCOUNTER — Other Ambulatory Visit: Payer: Self-pay

## 2019-01-26 ENCOUNTER — Ambulatory Visit (INDEPENDENT_AMBULATORY_CARE_PROVIDER_SITE_OTHER): Payer: Medicare HMO | Admitting: Licensed Clinical Social Worker

## 2019-01-26 DIAGNOSIS — F4310 Post-traumatic stress disorder, unspecified: Secondary | ICD-10-CM

## 2019-01-26 DIAGNOSIS — F331 Major depressive disorder, recurrent, moderate: Secondary | ICD-10-CM

## 2019-01-26 DIAGNOSIS — F411 Generalized anxiety disorder: Secondary | ICD-10-CM | POA: Diagnosis not present

## 2019-01-26 NOTE — Progress Notes (Signed)
Virtual Visit via Video Note  I connected with Mark [name] on 01/26/19 at  5:00 PM EST by a video enabled telemedicine application and verified that I am speaking with the correct person using two identifiers.   I discussed the limitations of evaluation and management by telemedicine and the availability of in person appointments. The patient expressed understanding and agreed to proceed.  History of Present Illness: [facility] is [facility] for [facility], depression, anxiety.    Observations/Objective: Patient participated in a discussion on fear of the Covid virus; fear is a future emotion which causes behavior changes. Patient was encouraged to use mindfulness skills, being present.  Assessment and Plan: Counselor will continue to meet with patient to address treatment plan goals. Patient will continue to follow recommendations of providers and implement skills learned in session.   Follow Up Instructions: I discussed the assessment and treatment plan with the patient. The patient was provided an opportunity to ask questions and all were answered. The patient agreed with the plan and demonstrated an understanding of the instructions.   The patient was advised to call back or seek an in-person evaluation if the symptoms worsen or if the condition fails to improve as anticipated.  I provided 120 minutes of non-face-to-face time during this encounter.   Taygen Acklin S, LCAS

## 2019-01-29 ENCOUNTER — Encounter (HOSPITAL_COMMUNITY): Payer: Self-pay | Admitting: Licensed Clinical Social Worker

## 2019-01-29 ENCOUNTER — Other Ambulatory Visit: Payer: Self-pay

## 2019-01-29 ENCOUNTER — Ambulatory Visit (INDEPENDENT_AMBULATORY_CARE_PROVIDER_SITE_OTHER): Payer: No Typology Code available for payment source | Admitting: Licensed Clinical Social Worker

## 2019-01-29 DIAGNOSIS — F411 Generalized anxiety disorder: Secondary | ICD-10-CM | POA: Diagnosis not present

## 2019-01-29 DIAGNOSIS — F331 Major depressive disorder, recurrent, moderate: Secondary | ICD-10-CM

## 2019-01-29 NOTE — Progress Notes (Signed)
Virtual Visit via Video Note  I connected with Mark [name] on 01/29/19 at 2:00pm EST by video enabled telemedicine with the correct person using two identifiers.   I discussed the limitations of evaluation and management by telemedicine and the availability of in person appointments. The patient expressed understanding and agreed to proceed.  History of Present Illness: Pt was referred to [facility] OP therapy for PTSD, depression and anxiety by the [facility]     Observations/Objective: Verbalize positive and hopeful feelings of the future. Reduce irritability. Patient presents depressed for his his webex appointment. He discussed his psychiatric symptoms and current life events. Patient continues with his medical symptoms, having lab work with no results from the [place], which exacerbate his mental health symptoms. Patient continues feeling hopeless and worthless, with catch-22 - send him to [facility] to become medically cleared to send him to [facility], where he may be exposed to COVID. Counselor and patient discussed the risks vs benefits. Gave patient 24 hour crisis line #.      Assessment and Plan: Counselor will continue to meet with patient to address treatment plan goals. Patient will continue to follow recommendations of providers and implement skills learned in session.  Follow Up Instructions: Counselor will send webex invitation weekly before each individual and group session.   I discussed the assessment and treatment plan with the patient. The patient was provided an opportunity to ask questions and all were answered. The patient agreed with the plan and demonstrated an understanding of the instructions.   The patient was advised to call back or seek an in-person evaluation if the symptoms worsen or if the condition fails to improve as anticipated.  I provided 60 minutes of non-face-to-face time during this encounter.   [name] Boughner S, LCAS  .

## 2019-02-02 ENCOUNTER — Other Ambulatory Visit: Payer: Self-pay

## 2019-02-02 ENCOUNTER — Encounter (HOSPITAL_COMMUNITY): Payer: Self-pay | Admitting: Licensed Clinical Social Worker

## 2019-02-02 ENCOUNTER — Ambulatory Visit (INDEPENDENT_AMBULATORY_CARE_PROVIDER_SITE_OTHER): Payer: No Typology Code available for payment source | Admitting: Licensed Clinical Social Worker

## 2019-02-02 DIAGNOSIS — F411 Generalized anxiety disorder: Secondary | ICD-10-CM

## 2019-02-02 DIAGNOSIS — F331 Major depressive disorder, recurrent, moderate: Secondary | ICD-10-CM

## 2019-02-02 NOTE — Progress Notes (Addendum)
Virtual Visit via Video Note  I connected with Mark [name] on 02/02/19 at  5:00 PM EST by a video enabled telemedicine application and verified that I am speaking with the correct person using two identifiers.   I discussed the limitations of evaluation and management by telemedicine and the availability of in person appointments. The patient expressed understanding and agreed to proceed.  History of Present Illness: [facility], depression, [facility].  LOCATION: Patient: home Therapist: home    Observations/Objective: Pt participated in a discussion on changing emotional responses, regulating emotions effectively and changing emotional responses. Encouraged patient to use his coping skills to more effectively regulate his emotions.  Assessment and Plan: Counselor will continue to meet with patient to address treatment plan goals. Patient will continue to follow recommendations of providers and implement skills learned in session.   Follow Up Instructions: I discussed the assessment and treatment plan with the patient. The patient was provided an opportunity to ask questions and all were answered. The patient agreed with the plan and demonstrated an understanding of the instructions.   The patient was advised to call back or seek an in-person evaluation if the symptoms worsen or if the condition fails to improve as anticipated.  I provided 120 minutes of non-face-to-face time during this encounter.   Nicholl Onstott S, LCAS

## 2019-02-05 ENCOUNTER — Ambulatory Visit (INDEPENDENT_AMBULATORY_CARE_PROVIDER_SITE_OTHER): Payer: No Typology Code available for payment source | Admitting: Licensed Clinical Social Worker

## 2019-02-05 ENCOUNTER — Other Ambulatory Visit: Payer: Self-pay

## 2019-02-05 ENCOUNTER — Encounter (HOSPITAL_COMMUNITY): Payer: Self-pay | Admitting: Licensed Clinical Social Worker

## 2019-02-05 DIAGNOSIS — F411 Generalized anxiety disorder: Secondary | ICD-10-CM

## 2019-02-05 DIAGNOSIS — F331 Major depressive disorder, recurrent, moderate: Secondary | ICD-10-CM | POA: Diagnosis not present

## 2019-02-05 NOTE — Progress Notes (Signed)
Virtual Visit via Video Note  I connected with Mark [name] on 02/05/19 at 2:00pm EST by video enabled telemedicine with the correct person using two identifiers.   I discussed the limitations of evaluation and management by telemedicine and the availability of in person appointments. The patient expressed understanding and agreed to proceed.  History of Present Illness: Pt was referred to [facility] OP therapy for PTSD, depression and anxiety by the [facility].     Observations/Objective: Verbalize positive and hopeful feelings of the future. Reduce irritability. Patient presents anxiety for his his webex appointment. He discussed his psychiatric symptoms and current life events. Patient reports his dr determined all his lab work is low but WNL. She referred him to other specialists. Patient presents more hopeful today. He has rejoined [name]. Patient feels his moods are more stable. Patient discussed his conflictual relationship within his family, and it's affects on his moods.Counselor assessed family dynamics, daily family functioning,  Counselor provided coping strategies and skills in addressing these issues, providing pychoeducation and exploration of pateint's feelings   Assessment and Plan: Counselor will continue to meet with patient to address treatment plan goals. Patient will continue to follow recommendations of providers and implement skills learned in session.  Follow Up Instructions: Counselor will send webex invitation weekly before each individual and group session.   I discussed the assessment and treatment plan with the patient. The patient was provided an opportunity to ask questions and all were answered. The patient agreed with the plan and demonstrated an understanding of the instructions.   The patient was advised to call back or seek an in-person evaluation if the symptoms worsen or if the condition fails to improve as anticipated.  I provided 60 minutes  of non-face-to-face time during this encounter.   Miesha Bachmann S, LCAS  .

## 2019-02-09 ENCOUNTER — Other Ambulatory Visit: Payer: Self-pay

## 2019-02-09 ENCOUNTER — Ambulatory Visit (INDEPENDENT_AMBULATORY_CARE_PROVIDER_SITE_OTHER): Payer: No Typology Code available for payment source | Admitting: Licensed Clinical Social Worker

## 2019-02-09 ENCOUNTER — Encounter (HOSPITAL_COMMUNITY): Payer: Self-pay | Admitting: Licensed Clinical Social Worker

## 2019-02-09 DIAGNOSIS — F4310 Post-traumatic stress disorder, unspecified: Secondary | ICD-10-CM

## 2019-02-09 DIAGNOSIS — F411 Generalized anxiety disorder: Secondary | ICD-10-CM | POA: Diagnosis not present

## 2019-02-09 DIAGNOSIS — F331 Major depressive disorder, recurrent, moderate: Secondary | ICD-10-CM

## 2019-02-09 NOTE — Progress Notes (Signed)
Virtual Visit via Video Note  I connected with Mark [name] on 02/09/19 at  5:00 PM EST by a video enabled telemedicine application and verified that I am speaking with the correct person using two identifiers.   I discussed the limitations of evaluation and management by telemedicine and the availability of in person appointments. The patient expressed understanding and agreed to proceed.  History of Present Illness: [facility], [facility].    Observations/Objective: Patient participated in a discussion on introspection, looking inward, self-examination, reflection, and inspection of thoughts. Patient described used self-reflection to describe his reflective thoughts. Patient was encouraged to continue using introspection.   Assessment and Plan: Counselor will continue to meet with patient to address treatment plan goals. Patient will continue to follow recommendations of providers and implement skills learned in session.   Follow Up Instructions: I discussed the assessment and treatment plan with the patient. The patient was provided an opportunity to ask questions and all were answered. The patient agreed with the plan and demonstrated an understanding of the instructions.   The patient was advised to call back or seek an in-person evaluation if the symptoms worsen or if the condition fails to improve as anticipated.  I provided 120 minutes of non-face-to-face time during this encounter.   Kasir Hallenbeck S, LCAS

## 2019-02-12 ENCOUNTER — Encounter (HOSPITAL_COMMUNITY): Payer: Self-pay | Admitting: Licensed Clinical Social Worker

## 2019-02-12 ENCOUNTER — Ambulatory Visit (INDEPENDENT_AMBULATORY_CARE_PROVIDER_SITE_OTHER): Payer: No Typology Code available for payment source | Admitting: Licensed Clinical Social Worker

## 2019-02-12 ENCOUNTER — Other Ambulatory Visit: Payer: Self-pay

## 2019-02-12 DIAGNOSIS — F331 Major depressive disorder, recurrent, moderate: Secondary | ICD-10-CM

## 2019-02-12 DIAGNOSIS — F411 Generalized anxiety disorder: Secondary | ICD-10-CM | POA: Diagnosis not present

## 2019-02-12 NOTE — Progress Notes (Signed)
Virtual Visit via Video Note  I connected with Mark [name] on 02/12/19 at 2:00pm EST by video enabled telemedicine with the correct person using two identifiers.   I discussed the limitations of evaluation and management by telemedicine and the availability of in person appointments. The patient expressed understanding and agreed to proceed.  History of Present Illness: Pt was referred to [facility] OP therapy for PTSD, depression and anxiety by the [facility].     Observations/Objective: Verbalize positive and hopeful feelings of the future. Reduce irritability. Patient presents depressed for his his webex appointment. He discussed his psychiatric symptoms and current life events. Patient describes his depressive symptoms and coping skills. Suggested to patient he contact his medical provider who manages his psychiatric meds. Discussed coping skills for depression, what works and what doesn't. Patient described his current interaction with his family and how their interaction contributes to his depressive symptoms. Coached patient on boundaries even with family.    Assessment and Plan: Counselor will continue to meet with patient to address treatment plan goals. Patient will continue to follow recommendations of providers and implement skills learned in session.  Follow Up Instructions: Counselor will send webex invitation weekly before each individual and group session.   I discussed the assessment and treatment plan with the patient. The patient was provided an opportunity to ask questions and all were answered. The patient agreed with the plan and demonstrated an understanding of the instructions.   The patient was advised to call back or seek an in-person evaluation if the symptoms worsen or if the condition fails to improve as anticipated.  I provided 60 minutes of non-face-to-face time during this encounter.   Jamera Vanloan S, LCAS  .

## 2019-02-16 ENCOUNTER — Encounter (HOSPITAL_COMMUNITY): Payer: Self-pay | Admitting: Licensed Clinical Social Worker

## 2019-02-16 ENCOUNTER — Other Ambulatory Visit: Payer: Self-pay

## 2019-02-16 ENCOUNTER — Ambulatory Visit (INDEPENDENT_AMBULATORY_CARE_PROVIDER_SITE_OTHER): Payer: No Typology Code available for payment source | Admitting: Licensed Clinical Social Worker

## 2019-02-16 DIAGNOSIS — F4310 Post-traumatic stress disorder, unspecified: Secondary | ICD-10-CM | POA: Diagnosis not present

## 2019-02-16 DIAGNOSIS — F331 Major depressive disorder, recurrent, moderate: Secondary | ICD-10-CM

## 2019-02-16 DIAGNOSIS — F411 Generalized anxiety disorder: Secondary | ICD-10-CM | POA: Diagnosis not present

## 2019-02-16 NOTE — Progress Notes (Addendum)
Virtual Visit via Video Note  I connected with Mark [name] on 02/16/19 at  5:00 PM EST by a video enabled telemedicine application and verified that I am speaking with the correct person using two identifiers.   I discussed the limitations of evaluation and management by telemedicine and the availability of in person appointments. The patient expressed understanding and agreed to proceed.  Location: Patient: home Provider: home  History of Present Illness: [facility], [facility], [facility].    Observations/Objective: Patient participated in a discussion on COVID fatigue. Patient described symptoms experiencing: isolating, fear, vigilance. Patient was encouraged to use coping skills for symptoms.  Assessment and Plan: Counselor will continue to meet with patient to address treatment plan goals. Patient will continue to follow recommendations of providers and implement skills learned in session.   Follow Up Instructions: I discussed the assessment and treatment plan with the patient. The patient was provided an opportunity to ask questions and all were answered. The patient agreed with the plan and demonstrated an understanding of the instructions.   The patient was advised to call back or seek an in-person evaluation if the symptoms worsen or if the condition fails to improve as anticipated.  I provided 120 minutes of non-face-to-face time during this encounter.   Abdiel Blackerby S, LCAS

## 2019-02-19 ENCOUNTER — Encounter (HOSPITAL_COMMUNITY): Payer: Self-pay | Admitting: Licensed Clinical Social Worker

## 2019-02-19 ENCOUNTER — Ambulatory Visit (INDEPENDENT_AMBULATORY_CARE_PROVIDER_SITE_OTHER): Payer: No Typology Code available for payment source | Admitting: Licensed Clinical Social Worker

## 2019-02-19 ENCOUNTER — Other Ambulatory Visit: Payer: Self-pay

## 2019-02-19 DIAGNOSIS — F411 Generalized anxiety disorder: Secondary | ICD-10-CM | POA: Diagnosis not present

## 2019-02-19 DIAGNOSIS — F332 Major depressive disorder, recurrent severe without psychotic features: Secondary | ICD-10-CM | POA: Diagnosis not present

## 2019-02-19 DIAGNOSIS — F4310 Post-traumatic stress disorder, unspecified: Secondary | ICD-10-CM

## 2019-02-19 NOTE — Progress Notes (Signed)
Virtual Visit via Video Note  I connected with Mark [name] on 02/19/19 at 2:00pm EST by video enabled telemedicine with the correct person using two identifiers.   I discussed the limitations of evaluation and management by telemedicine and the availability of in person appointments. The patient expressed understanding and agreed to proceed.  History of Present Illness: Pt was referred to [facility] OP therapy for PTSD, depression and anxiety by the [facility].     Observations/Objective: Verbalize positive and hopeful feelings of the future. Reduce irritability. Patient presents depressed for his his webex appointment. He discussed his psychiatric symptoms and current life events. Patient describes his depressive symptoms and coping skills. Patient has appointment with medical provider who manages his psychiatric meds tomorrow, next psychiatrist appointment at the [facility]. Educated patient on PTSD symptoms, where he identified with most of the symptoms. We researched [facility] in [place] [facility]. Discussed housing, where to board his dog. Counselor contacted personal veternarian who agreed to board a veteran's dog for free. Patient does not want to go to a facility until he completes all his appointments with the [place].    Assessment and Plan: Counselor will continue to meet with patient to address treatment plan goals. Patient will continue to follow recommendations of providers and implement skills learned in session.  Follow Up Instructions: Counselor will send webex invitation weekly before each individual and group session.   I discussed the assessment and treatment plan with the patient. The patient was provided an opportunity to ask questions and all were answered. The patient agreed with the plan and demonstrated an understanding of the instructions.   The patient was advised to call back or seek an in-person evaluation if the  symptoms worsen or if the condition fails to improve as anticipated.  I provided 60 minutes of non-face-to-face time during this encounter.   Horace Lukas S, LCAS  .

## 2019-02-23 ENCOUNTER — Encounter (HOSPITAL_COMMUNITY): Payer: Self-pay | Admitting: Licensed Clinical Social Worker

## 2019-02-23 ENCOUNTER — Other Ambulatory Visit: Payer: Self-pay

## 2019-02-23 ENCOUNTER — Ambulatory Visit (INDEPENDENT_AMBULATORY_CARE_PROVIDER_SITE_OTHER): Payer: No Typology Code available for payment source | Admitting: Licensed Clinical Social Worker

## 2019-02-23 DIAGNOSIS — F411 Generalized anxiety disorder: Secondary | ICD-10-CM | POA: Diagnosis not present

## 2019-02-23 DIAGNOSIS — F332 Major depressive disorder, recurrent severe without psychotic features: Secondary | ICD-10-CM | POA: Diagnosis not present

## 2019-02-23 DIAGNOSIS — F4310 Post-traumatic stress disorder, unspecified: Secondary | ICD-10-CM

## 2019-02-23 NOTE — Progress Notes (Signed)
Virtual Visit via Video Note  I connected with Mark [name] on 02/23/19 at  5:00 PM EST by a video enabled telemedicine application and verified that I am speaking with the correct person using two identifiers.   I discussed the limitations of evaluation and management by telemedicine and the availability of in person appointments. The patient expressed understanding and agreed to proceed.  History of Present Illness: [facility], [facility], [facility].    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 Patient was encouraged to continue to share his feelings even guilt and shame.  Assessment and Plan: Counselor will continue to meet with patient to address treatment plan goals. Patient will continue to follow recommendations of providers and implement skills learned in session.   Follow Up Instructions: I discussed the assessment and treatment plan with the patient. The patient was provided an opportunity to ask questions and all were answered. The patient agreed with the plan and demonstrated an understanding of the instructions.   The patient was advised to call back or seek an in-person evaluation if the symptoms worsen or if the condition fails to improve as anticipated.  I provided 120 minutes of non-face-to-face time during this encounter.   Mark [name] S, LCAS

## 2019-02-26 ENCOUNTER — Encounter (HOSPITAL_COMMUNITY): Payer: Self-pay | Admitting: Licensed Clinical Social Worker

## 2019-02-26 ENCOUNTER — Other Ambulatory Visit: Payer: Self-pay

## 2019-02-26 ENCOUNTER — Ambulatory Visit (INDEPENDENT_AMBULATORY_CARE_PROVIDER_SITE_OTHER): Payer: No Typology Code available for payment source | Admitting: Licensed Clinical Social Worker

## 2019-02-26 DIAGNOSIS — F411 Generalized anxiety disorder: Secondary | ICD-10-CM

## 2019-02-26 DIAGNOSIS — F4310 Post-traumatic stress disorder, unspecified: Secondary | ICD-10-CM | POA: Diagnosis not present

## 2019-02-26 DIAGNOSIS — F332 Major depressive disorder, recurrent severe without psychotic features: Secondary | ICD-10-CM | POA: Diagnosis not present

## 2019-02-26 NOTE — Progress Notes (Signed)
Virtual Visit via Video Note  I connected with Mark [name] on 02/19/19 at 2:00pm EST by video enabled telemedicine with the correct person using two identifiers.   I discussed the limitations of evaluation and management by telemedicine and the availability of in person appointments. The patient expressed understanding and agreed to proceed.  History of Present Illness: Pt was referred to [facility] OP therapy for PTSD, depression and anxiety by the [facility].     Observations/Objective: Verbalize positive and hopeful feelings of the future. Reduce irritability. Patient presents depressed for his his webex appointment. He discussed his psychiatric symptoms and current life events. Patient describes his depressive symptoms and coping skills. Patient had [facility] Dr. Appointment for cysts and will need OP surgery. Can anything else happen?" Patient continues to feel hopeless. Used CBT to help guide patient to develop more constructive and balanced ways of coping with his stressors. Used CBT to help patient focus on how to improve his state of mind right now,  rather than him looking back on the past. Assisted pt identifying negative thought patterns as well as changin his behavioral responses to stressful and challenging situations.   Assessment and Plan: Counselor will continue to meet with patient to address treatment plan goals. Patient will continue to follow recommendations of providers and implement skills learned in session.  Follow Up Instructions: Counselor will send webex invitation weekly before each individual and group session.   I discussed the assessment and treatment plan with the patient. The patient was provided an opportunity to ask questions and all were answered. The patient agreed with the plan and demonstrated an understanding of the instructions.   The patient was advised to call back or seek an in-person evaluation if the symptoms worsen or if the condition fails to improve as  anticipated.  I provided 60 minutes of non-face-to-face time during this encounter.   Glenyce Randle S, LCAS  .

## 2019-03-02 ENCOUNTER — Encounter (HOSPITAL_COMMUNITY): Payer: Self-pay | Admitting: Licensed Clinical Social Worker

## 2019-03-02 ENCOUNTER — Other Ambulatory Visit: Payer: Self-pay

## 2019-03-02 ENCOUNTER — Ambulatory Visit (INDEPENDENT_AMBULATORY_CARE_PROVIDER_SITE_OTHER): Payer: No Typology Code available for payment source | Admitting: Licensed Clinical Social Worker

## 2019-03-02 DIAGNOSIS — F411 Generalized anxiety disorder: Secondary | ICD-10-CM

## 2019-03-02 DIAGNOSIS — F332 Major depressive disorder, recurrent severe without psychotic features: Secondary | ICD-10-CM

## 2019-03-02 DIAGNOSIS — F4310 Post-traumatic stress disorder, unspecified: Secondary | ICD-10-CM

## 2019-03-02 NOTE — Progress Notes (Signed)
Virtual Visit via Video Note  I connected with Mark [name] on 03/02/19 at  5:00 PM EST by a video enabled telemedicine application and verified that I am speaking with the correct person using two identifiers.   I discussed the limitations of evaluation and management by telemedicine and the availability of in person appointments. The patient expressed understanding and agreed to proceed.  History of Present Illness: [facility], [facility], [facility].    Observations/Objective: Pt participated in a discussion on changing emotional responses and regulating emotions effectively. Encouraged patient to use his coping skills to more effectively regulate his emotions.  Assessment and Plan: Counselor will continue to meet with patient to address treatment plan goals. Patient will continue to follow recommendations of providers and implement skills learned in session.   Follow Up Instructions: I discussed the assessment and treatment plan with the patient. The patient was provided an opportunity to ask questions and all were answered. The patient agreed with the plan and demonstrated an understanding of the instructions.   The patient was advised to call back or seek an in-person evaluation if the symptoms worsen or if the condition fails to improve as anticipated.  I provided 60 minutes of non-face-to-face time during this encounter.   Dadrian Ballantine S, LCAS

## 2019-03-05 ENCOUNTER — Other Ambulatory Visit: Payer: Self-pay

## 2019-03-05 ENCOUNTER — Encounter (HOSPITAL_COMMUNITY): Payer: Self-pay | Admitting: Licensed Clinical Social Worker

## 2019-03-05 ENCOUNTER — Ambulatory Visit (INDEPENDENT_AMBULATORY_CARE_PROVIDER_SITE_OTHER): Payer: No Typology Code available for payment source | Admitting: Licensed Clinical Social Worker

## 2019-03-05 DIAGNOSIS — F411 Generalized anxiety disorder: Secondary | ICD-10-CM | POA: Diagnosis not present

## 2019-03-05 DIAGNOSIS — F332 Major depressive disorder, recurrent severe without psychotic features: Secondary | ICD-10-CM

## 2019-03-05 DIAGNOSIS — F4310 Post-traumatic stress disorder, unspecified: Secondary | ICD-10-CM | POA: Diagnosis not present

## 2019-03-05 NOTE — Progress Notes (Signed)
Virtual Visit via Video Note  I connected with Mark [name] on 03/05/19 at 2:00pm EST by video enabled telemedicine with the correct person using two identifiers.   I discussed the limitations of evaluation and management by telemedicine and the availability of in person appointments. The patient expressed understanding and agreed to proceed.  History of Present Illness: Pt was referred to [facility] OP therapy for PTSD, depression and anxiety by the [facility].     Observations/Objective: Verbalize positive and hopeful feelings of the future.  Patient presents depressed for his his webex appointment. He discussed his psychiatric symptoms and current life events. Patient describes his depressive symptoms and coping skills Patient does not think his medications nor coping skills are working. Patient admits to feeling hopeless and worthless, desiring inpatient mental health treatment.Discussed how to  Enter mental health inpatient treatment at the [place] in [place].He reports no S/I. Suggested patient reach out to his PCP at the [facility] to get direction on mental vs physical health treatment.Assisted patient by guiding him to develop more constructive and balanced ways of coping with stressors, hoping to eliminate or minimize your negative thoughts and behaviors.   Assessment and Plan: Counselor will continue to meet with patient to address treatment plan goals. Patient will continue to follow recommendations of providers and implement skills learned in session.  Follow Up Instructions: Counselor will send webex invitation weekly before each individual and group session.   I discussed the assessment and treatment plan with the patient. The patient was provided an opportunity to ask questions and all were answered. The patient agreed with the plan and demonstrated an understanding of the instructions.   The patient was advised to call back or seek an in-person evaluation if the symptoms worsen or if the  condition fails to improve as anticipated.  I provided 60 minutes of non-face-to-face time during this encounter.   Martena Emanuele S, LCAS  .

## 2019-03-09 ENCOUNTER — Encounter (HOSPITAL_COMMUNITY): Payer: Self-pay | Admitting: Licensed Clinical Social Worker

## 2019-03-09 ENCOUNTER — Ambulatory Visit (INDEPENDENT_AMBULATORY_CARE_PROVIDER_SITE_OTHER): Payer: No Typology Code available for payment source | Admitting: Licensed Clinical Social Worker

## 2019-03-09 ENCOUNTER — Other Ambulatory Visit: Payer: Self-pay

## 2019-03-09 DIAGNOSIS — F332 Major depressive disorder, recurrent severe without psychotic features: Secondary | ICD-10-CM

## 2019-03-09 DIAGNOSIS — F4310 Post-traumatic stress disorder, unspecified: Secondary | ICD-10-CM | POA: Diagnosis not present

## 2019-03-09 DIAGNOSIS — F411 Generalized anxiety disorder: Secondary | ICD-10-CM | POA: Diagnosis not present

## 2019-03-09 NOTE — Progress Notes (Signed)
Virtual Visit via Video Note  I connected with Mark [name] on 03/09/19 at  5:00 PM EST by a video enabled telemedicine application and verified that I am speaking with the correct person using two identifiers.   I discussed the limitations of evaluation and management by telemedicine and the availability of in person appointments. The patient expressed understanding and agreed to proceed.  History of Present Illness: [facility], [facility], [facility].    Observations/Objective: Patient participated in a discussion on low frustration tolerance. Educated patient on improving his low frustration tolerance using "Accepts."  Patient was encouraged to work on high frustration tolerance.   Assessment and Plan: Counselor will continue to meet with patient to address treatment plan goals. Patient will continue to follow recommendations of providers and implement skills learned in session.   Follow Up Instructions: I discussed the assessment and treatment plan with the patient. The patient was provided an opportunity to ask questions and all were answered. The patient agreed with the plan and demonstrated an understanding of the instructions.   The patient was advised to call back or seek an in-person evaluation if the symptoms worsen or if the condition fails to improve as anticipated.  I provided 60 minutes of non-face-to-face time during this encounter.   Dacen Frayre S, LCAS

## 2019-03-12 ENCOUNTER — Ambulatory Visit (INDEPENDENT_AMBULATORY_CARE_PROVIDER_SITE_OTHER): Payer: No Typology Code available for payment source | Admitting: Licensed Clinical Social Worker

## 2019-03-12 ENCOUNTER — Encounter (HOSPITAL_COMMUNITY): Payer: Self-pay | Admitting: Licensed Clinical Social Worker

## 2019-03-12 ENCOUNTER — Other Ambulatory Visit: Payer: Self-pay

## 2019-03-12 DIAGNOSIS — F332 Major depressive disorder, recurrent severe without psychotic features: Secondary | ICD-10-CM

## 2019-03-12 DIAGNOSIS — F411 Generalized anxiety disorder: Secondary | ICD-10-CM | POA: Diagnosis not present

## 2019-03-12 DIAGNOSIS — F4310 Post-traumatic stress disorder, unspecified: Secondary | ICD-10-CM

## 2019-03-12 NOTE — Progress Notes (Signed)
Virtual Visit via Video Note  I connected with Mark [name] on 03/12/19 at 2:00pm EST by video enabled telemedicine with the correct person using two identifiers.   I discussed the limitations of evaluation and management by telemedicine and the availability of in person appointments. The patient expressed understanding and agreed to proceed.  History of Present Illness: Pt was referred to [facility] OP therapy for PTSD, depression and anxiety by the [facility].     Observations/Objective: Verbalize positive and hopeful feelings of the future.  Patient presents more depressed for his his webex appointment. He discussed his psychiatric symptoms and current life events. Patient describes his depressive symptoms and desire to go inpatient. He went to the [place] to try to get his medical issues addressed. He spoke to [facility] Nurse and mental health nurse at the [facility] about entering inpatient treatment. Having previously spoken to patient about [facility], facility in [place] for veterans. Counselor reached out to [facility] for information and referral. Patient continues to feeling hopeless and worthless, but at this time no SI/HI. Will followup with referral to [facility] and arrange for patient to get veterinarian care for his dog will inpatient.  Assessment and Plan: Counselor will continue to meet with patient to address treatment plan goals. Patient will continue to follow recommendations of providers and implement skills learned in session.  Follow Up Instructions: Counselor will send webex invitation weekly before each individual and group session.   I discussed the assessment and treatment plan with the patient. The patient was provided an opportunity to ask questions and all were answered. The patient agreed with the plan and demonstrated an understanding of the instructions.   The patient was advised to call back or seek an in-person evaluation if the symptoms worsen or if the condition fails to improve as  anticipated.  I provided 60 minutes of non-face-to-face time during this encounter.   Anzleigh Slaven S, LCAS  .

## 2019-03-16 ENCOUNTER — Other Ambulatory Visit: Payer: Self-pay

## 2019-03-16 ENCOUNTER — Encounter (HOSPITAL_COMMUNITY): Payer: Self-pay | Admitting: Licensed Clinical Social Worker

## 2019-03-16 ENCOUNTER — Ambulatory Visit (INDEPENDENT_AMBULATORY_CARE_PROVIDER_SITE_OTHER): Payer: No Typology Code available for payment source | Admitting: Licensed Clinical Social Worker

## 2019-03-16 DIAGNOSIS — F411 Generalized anxiety disorder: Secondary | ICD-10-CM

## 2019-03-16 DIAGNOSIS — F4310 Post-traumatic stress disorder, unspecified: Secondary | ICD-10-CM

## 2019-03-16 DIAGNOSIS — F332 Major depressive disorder, recurrent severe without psychotic features: Secondary | ICD-10-CM

## 2019-03-16 NOTE — Progress Notes (Signed)
Virtual Visit via Video Note  I connected with Mark [name] on 03/16/19 at  5:00 PM EST by a video enabled telemedicine application and verified that I am speaking with the correct person using two identifiers.   I discussed the limitations of evaluation and management by telemedicine and the availability of in person appointments. The patient expressed understanding and agreed to proceed.  History of Present Illness: [facility], [facility], [facility].    Observations/Objective: Patient discussed in the group her decision to go inpatient for his mental health needs. His depressive symptoms continue to increase to the point he is feeling hopeless. He needs to have his medications stabilized. Patient asked for feedback from the group and received positive feedback and wishes for moving forward in his mental wellness.   Assessment and Plan: Counselor will continue to meet with patient to address treatment plan goals. Patient will continue to follow recommendations of providers and implement skills learned in session.   Follow Up Instructions: I discussed the assessment and treatment plan with the patient. The patient was provided an opportunity to ask questions and all were answered. The patient agreed with the plan and demonstrated an understanding of the instructions.   The patient was advised to call back or seek an in-person evaluation if the symptoms worsen or if the condition fails to improve as anticipated.  I provided 60 minutes of non-face-to-face time during this encounter.   Flora Ratz S, LCAS

## 2019-03-19 ENCOUNTER — Ambulatory Visit (HOSPITAL_COMMUNITY): Payer: No Typology Code available for payment source | Admitting: Licensed Clinical Social Worker

## 2019-03-23 ENCOUNTER — Ambulatory Visit (HOSPITAL_COMMUNITY): Payer: No Typology Code available for payment source | Admitting: Licensed Clinical Social Worker

## 2019-03-23 ENCOUNTER — Telehealth (HOSPITAL_COMMUNITY): Payer: Self-pay | Admitting: Licensed Clinical Social Worker

## 2019-03-23 ENCOUNTER — Other Ambulatory Visit: Payer: Self-pay

## 2019-03-23 NOTE — Telephone Encounter (Signed)
Psychiatrist from [place] called with an update on patient who is currently inpatient for mental health for crisis stabilization.  Artis Buechele, LCAS

## 2019-03-23 NOTE — Telephone Encounter (Signed)
Patient called from the ED at the [facility] hospital to enter inpatient mental health treatment. Chevon Laufer, lcas

## 2019-03-26 ENCOUNTER — Telehealth (HOSPITAL_COMMUNITY): Payer: Self-pay | Admitting: Licensed Clinical Social Worker

## 2019-03-26 NOTE — Telephone Encounter (Signed)
Called psychiatrist at [facility] hospital inpatient facility to inquire about discharge plans  [facility], LCAS

## 2019-03-30 ENCOUNTER — Encounter (HOSPITAL_COMMUNITY): Payer: Self-pay | Admitting: Licensed Clinical Social Worker

## 2019-03-30 ENCOUNTER — Ambulatory Visit (HOSPITAL_COMMUNITY): Payer: No Typology Code available for payment source | Admitting: Licensed Clinical Social Worker

## 2019-03-30 ENCOUNTER — Ambulatory Visit (INDEPENDENT_AMBULATORY_CARE_PROVIDER_SITE_OTHER): Payer: No Typology Code available for payment source | Admitting: Licensed Clinical Social Worker

## 2019-03-30 ENCOUNTER — Other Ambulatory Visit: Payer: Self-pay

## 2019-03-30 DIAGNOSIS — F332 Major depressive disorder, recurrent severe without psychotic features: Secondary | ICD-10-CM

## 2019-03-30 DIAGNOSIS — F411 Generalized anxiety disorder: Secondary | ICD-10-CM

## 2019-03-30 DIAGNOSIS — F4310 Post-traumatic stress disorder, unspecified: Secondary | ICD-10-CM
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
## 2019-03-30 NOTE — Progress Notes (Signed)
Virtual Visit via Video Note  I connected with [name] on 03/30/19 at  5:00 PM EST by a video enabled telemedicine application and verified that I am speaking with the correct person using two identifiers.   I discussed the limitations of evaluation and management by telemedicine and the availability of in person appointments. The patient expressed understanding and agreed to proceed.  History of Present Illness: [facility], [facility], anxiety.    Observations/Objective: Patient participated in a discussion on emotional regulation skills, helping patient learn to manage his feelings and to better cope with situations.  Patient was encouraged to apply these skills: opposite action, checking the facts, P.L.E.A.S.E. and focusing on positive events.  Assessment and Plan: Counselor will continue to meet with patient to address treatment plan goals. Patient will continue to follow recommendations of providers and implement skills learned in session.   Follow Up Instructions: I discussed the assessment and treatment plan with the patient. The patient was provided an opportunity to ask questions and all were answered. The patient agreed with the plan and demonstrated an understanding of the instructions.   The patient was advised to call back or seek an in-person evaluation if the symptoms worsen or if the condition fails to improve as anticipated.  I provided 120 minutes of non-face-to-face time during this encounter.   Annabell Oconnor S, LCAS

## 2019-03-31 ENCOUNTER — Encounter (HOSPITAL_COMMUNITY): Payer: Self-pay

## 2019-03-31 ENCOUNTER — Other Ambulatory Visit: Payer: Self-pay

## 2019-03-31 ENCOUNTER — Other Ambulatory Visit (HOSPITAL_COMMUNITY): Payer: No Typology Code available for payment source | Attending: Psychiatry | Admitting: Specialist

## 2019-03-31 ENCOUNTER — Other Ambulatory Visit (HOSPITAL_COMMUNITY): Payer: No Typology Code available for payment source | Admitting: Licensed Clinical Social Worker

## 2019-03-31 DIAGNOSIS — Z794 Long term (current) use of insulin: Secondary | ICD-10-CM | POA: Insufficient documentation

## 2019-03-31 DIAGNOSIS — R4589 Other symptoms and signs involving emotional state: Secondary | ICD-10-CM | POA: Diagnosis not present

## 2019-03-31 DIAGNOSIS — Z79899 Other long term (current) drug therapy: Secondary | ICD-10-CM | POA: Insufficient documentation

## 2019-03-31 DIAGNOSIS — F419 Anxiety disorder, unspecified: Secondary | ICD-10-CM | POA: Diagnosis not present

## 2019-03-31 DIAGNOSIS — E119 Type 2 diabetes mellitus without complications: Secondary | ICD-10-CM | POA: Insufficient documentation

## 2019-03-31 DIAGNOSIS — R45851 Suicidal ideations: Secondary | ICD-10-CM | POA: Insufficient documentation

## 2019-03-31 DIAGNOSIS — E785 Hyperlipidemia, unspecified: Secondary | ICD-10-CM | POA: Insufficient documentation

## 2019-03-31 DIAGNOSIS — Z7901 Long term (current) use of anticoagulants: Secondary | ICD-10-CM | POA: Insufficient documentation

## 2019-03-31 DIAGNOSIS — R41844 Frontal lobe and executive function deficit: Secondary | ICD-10-CM

## 2019-03-31 DIAGNOSIS — Z658 Other specified problems related to psychosocial circumstances: Secondary | ICD-10-CM | POA: Diagnosis not present

## 2019-03-31 DIAGNOSIS — F4310 Post-traumatic stress disorder, unspecified: Secondary | ICD-10-CM | POA: Diagnosis not present

## 2019-03-31 DIAGNOSIS — F332 Major depressive disorder, recurrent severe without psychotic features: Secondary | ICD-10-CM | POA: Diagnosis not present

## 2019-03-31 DIAGNOSIS — I10 Essential (primary) hypertension: Secondary | ICD-10-CM | POA: Insufficient documentation

## 2019-03-31 DIAGNOSIS — F17210 Nicotine dependence, cigarettes, uncomplicated: Secondary | ICD-10-CM | POA: Insufficient documentation

## 2019-03-31 DIAGNOSIS — F411 Generalized anxiety disorder: Secondary | ICD-10-CM

## 2019-03-31 NOTE — Psych (Signed)
Virtual Visit via Video Note  I connected with Mark [name] on 03/31/19 at  9:00 AM EST by a video enabled telemedicine application and verified that I am speaking with the correct person using two identifiers.   I discussed the limitations of evaluation and management by telemedicine and the availability of in person appointments. The patient expressed understanding and agreed to proceed.  I discussed the assessment and treatment plan with the patient. The patient was provided an opportunity to ask questions and all were answered. The patient agreed with the plan and demonstrated an understanding of the instructions.   The patient was advised to call back or seek an in-person evaluation if the symptoms worsen or if the condition fails to improve as anticipated.  I provided 10 minutes of non-face-to-face time during this encounter.   [name], LCSW   Cln completed treatment plan for PHP with pt. Pt reports verbal consent to treatment and alignment with treatment plan.     [name], LCSW

## 2019-03-31 NOTE — Therapy (Signed)
[facility] [place], [place], [address] Phone: [phone]   Fax:  [phone] Virtual Visit via Video Note  I connected with Mark [name] on 03/31/19 at  11:00 AM EST by a video enabled telemedicine application and verified that I am speaking with the correct person using two identifiers.   I discussed the limitations of evaluation and management by telemedicine and the availability of in person appointments. The patient expressed understanding and agreed to proceed.    I discussed the assessment and treatment plan with the patient. The patient was provided an opportunity to ask questions and all were answered. The patient agreed with the plan and demonstrated an understanding of the instructions.   The patient was advised to call back or seek an in-person evaluation if the symptoms worsen or if the condition fails to improve as anticipated.  I provided 80 minutes of non-face-to-face time during this encounter.      Occupational Therapy Evaluation   Patient Details  Name: Mark [name] MRN: [id number] Date of Birth: 06/25/1960 Referring Provider (OT): Ricky Ala, NP   Encounter Date: 03/31/2019  OT End of Session - 03/31/19 1513    Visit Number  1    Number of Visits  8    Date for OT Re-Evaluation  04/28/19    Authorization Type  [facility] and [employer]    Progress Note Due on Visit  10    OT Start Time  1105    OT Stop Time  1230    OT Time Calculation (min)  85 min    Activity Tolerance  Patient tolerated treatment well    Behavior During Therapy  WFL for tasks assessed/performed       Past Medical History:  Diagnosis Date  . Above knee amputation status, right   . Anxiety   . Depression   . Diabetes mellitus, type II ([place])   . Hyperlipemia   . Hypertension   . PTSD (post-traumatic stress disorder)   . Shoulder pain     Past Surgical History:  Procedure Laterality Date  . HERNIA REPAIR    .  LEG AMPUTATION Right   . parenatal cyst      There were no vitals filed for this visit.  Subjective Assessment - 03/31/19 1512    Currently in Pain?  No/denies       OT assessment Diagnosis  MDD, PTSD, GAD Past medical history/referral information - amputation, left RCR  Living situation lives alone with his pet [name] - estranged from wife  ADLs - has an aide who assists  Work - disability Leisure - computer games, Web designer, reading  Social support - minimal Struggles - ambition and drive, pessimistic thoughts, isolation OT goal - develop routines and personal accountability to  [employer] out the routines [place] Summary of Client Scores:  FACILITATES PARTICIPATION IN OCCUPATION  ALLOWS PARTICIPATION IN OCCUPATION INHIBITS PARTICIPATION IN OCCUPATION RESTRICTS PARTICIPATION IN OCCUPATION COMMENTS  ROLES   x    HABITS   x    PERSONAL CAUSATION   x    VALUES   x    INTERESTS   x    SKILLS   x    SHORT TERM GOALS    x   LONG TERM GOALS    x   INTERPETATION OF PAST EXPERIENCES    x   PHYSICAL ENVIRONMENT  x     SOCIAL ENVIRONMENT    x  READINESS FOR CHANGE    x    Need for Occupational Therapy:  4 Shows positive occupational participation, no need for OT.   3 Need for minimal intervention/consultative participation  x 2 Need for OT intervention indicated to restore/improve participation   1 Need for extensive OT intervention indicated to improve participation.  Referral for follow up services also recommended.     Patient participated in skilled OT group focusing on time management this date.  Group consisted of warm up activity relating how you would spend $86,400 if you had it and couldn't have any left over at end of day - as we have 86,400 seconds in a day and need to spend them wisely.  Group discussed if time can be managed vs activities in a day being managed.  Group was educated on effects of good time management ( improved focus, decision  making, success, decreased stress, increased free time) as well as effects of inefficient time management ( inefficiency, increased stress, and poor quality work).  Group discussed common time thieves and possible solutions, and finished with education on tips to improve time management ( know how you currently spend time, set priorities, use a scheduling tool, schedule time appropriately, get organized, delegate, avoid procrastination, manage time wasters, avoid multitasking, stay healthy).  Group concluded with members identifying one new time management strategy they plan to implement into daily routine.      Assessment:  Patient demonstrates behavior that inhibits participation in occupation.  Patient will benefit from occupational therapy intervention in order to improve time management, financial management, stress management, job readiness skills, social skills, and health management skills in preparation to return to full time community living and to be a productive community member.  Patient identified that he will tackle largest responsibilities first each day as well as account for time and activity on his planner. Plan:  Patient will participate in skilled occupational therapy sessions individually or in a group setting to improve coping skills, psychosocial skills, and emotional skills required to return to prior level of function. Treatment will be 2 times per week for 3 weeks.  Next session to focus on personal accountability.                   OT Education - 03/31/19 1512    Education Details  10 time management tips, OT goals    Person(s) Educated  Patient    Methods  Explanation;Handout    Comprehension  Verbalized understanding       OT Short Term Goals - 03/31/19 1522      OT SHORT TERM GOAL #1   Title  Pt will be educated on strategies to improve psychosocial skills needed to participate fully in all daily, work, and leisure activities    Time  4    Period   Weeks    Status  New    Target Date  04/28/19      OT SHORT TERM GOAL #2   Title  Pt will apply psychosocial skills and coping mechanisms to daily activities in order to function independently and reintegrate into community    Time  4    Period  Weeks    Status  New      OT SHORT TERM GOAL #3   Title  Pt will recall and/or apply 1-3 sleep hygiene strategies to improve BADL functioning prior to reintegrating into community    Time  4    Period  Weeks    Status  New      OT SHORT TERM GOAL #4   Title  Pt will engage in goal setting to improve functional BADL/IADL routine upon reintegrating into community    Time  4    Period  Weeks    Status  New      OT SHORT TERM GOAL #5   Title  Pt will create and/or implement functional routine to improve successful engagement in daily tasks prior to reintegrating into community    Time  4    Period  Weeks    Status  New               Plan - 03/31/19 1521    OT Occupational Profile and History  Problem Focused Assessment - Including review of records relating to presenting problem    Occupational performance deficits (Please refer to evaluation for details):  ADL's;IADL's;Rest and Sleep;Leisure;Social Participation    Body Structure / Function / Physical Skills  ADL    Cognitive Skills  Emotional;Energy/Drive;Thought    Psychosocial Skills  Coping Strategies;Environmental  Adaptations;Habits;Interpersonal Interaction;Routines and Behaviors    Rehab Potential  Good    Clinical Decision Making  Several treatment options, min-mod task modification necessary    Comorbidities Affecting Occupational Performance:  May have comorbidities impacting occupational performance    Modification or Assistance to Complete Evaluation   Min-Moderate modification of tasks or assist with assess necessary to complete eval    OT Frequency  2x / week    OT Duration  4 weeks    OT Treatment/Interventions  Self-care/ADL training;Patient/family  education;Coping strategies training;Psychosocial skills training       Patient will benefit from skilled therapeutic intervention in order to improve the following deficits and impairments:   Body Structure / Function / Physical Skills: ADL Cognitive Skills: Emotional, Energy/Drive, Thought Psychosocial Skills: Coping Strategies, Environmental  Adaptations, Habits, Interpersonal Interaction, Routines and Behaviors   Visit Diagnosis: Difficulty coping  Generalized anxiety disorder  MDD (major depressive disorder), recurrent severe, without psychosis (HCC)  PTSD (post-traumatic stress disorder)  Executive function deficit    Problem List Patient Active Problem List   Diagnosis Date Noted  . PTSD (post-traumatic stress disorder) 04/15/2017  . Major depressive disorder, recurrent episode, moderate (HCC) 04/30/2016  . Generalized anxiety disorder 04/30/2016    Shirlean Mylar, MHA, OTR/L [id number]  03/31/2019, 3:23 PM  [facility] PARTIAL HOSPITALIZATION PROGRAM [address] [place], [place], [address] Phone: [phone]   Fax:  [phone]  Name: Jovany Disano MRN: [id number] Date of Birth: 1960/08/05

## 2019-04-01 ENCOUNTER — Encounter (HOSPITAL_COMMUNITY): Payer: Self-pay | Admitting: Family

## 2019-04-01 ENCOUNTER — Other Ambulatory Visit (HOSPITAL_COMMUNITY): Payer: No Typology Code available for payment source | Admitting: Licensed Clinical Social Worker

## 2019-04-01 ENCOUNTER — Other Ambulatory Visit: Payer: Self-pay

## 2019-04-01 DIAGNOSIS — F332 Major depressive disorder, recurrent severe without psychotic features: Secondary | ICD-10-CM | POA: Diagnosis not present

## 2019-04-01 DIAGNOSIS — F411 Generalized anxiety disorder: Secondary | ICD-10-CM

## 2019-04-01 DIAGNOSIS — F4310 Post-traumatic stress disorder, unspecified: Secondary | ICD-10-CM

## 2019-04-01 NOTE — Progress Notes (Signed)
Spoke with patient via Webex video call, used 2 identifiers to correctly identify patient. States he was referred after an inpatient stay at [facility] for depression/anxiety. He states his medications stopped working. They placed him on several new medications and discontinued several others. He has a lot of hopelessness but does not feel suicidal. On scale of 1-10 as 10 being worst he rates depression at 6 and anxiety at 9. Denies SI/HI or AV hallucinations. PHQ9=17. Enjoying groups so far. No issues or complaints

## 2019-04-01 NOTE — Addendum Note (Signed)
Addended by: Vernona Rieger on: 04/01/2019 02:44 PM   Modules accepted: Level of Service

## 2019-04-01 NOTE — Progress Notes (Signed)
Virtual Visit via Video Note  I connected with [name] on 04/01/19 at  9:00 AM EST by a video enabled telemedicine application and verified that I am speaking with the correct person using two identifiers.   I discussed the limitations of evaluation and management by telemedicine and the availability of in person appointments. The patient expressed understanding and agreed to proceed.   Follow Up Instructions:    I discussed the assessment and treatment plan with the patient. The patient was provided an opportunity to ask questions and all were answered. The patient agreed with the plan and demonstrated an understanding of the instructions.   The patient was advised to call back or seek an in-person evaluation if the symptoms worsen or if the condition fails to improve as anticipated.  I provided 30 minutes of non-face-to-face time during this encounter.   [name], NP    Behavioral Health Partial Program Assessment Note  Date: 04/01/2019 Name: Mark [name] MRN: [id number]   HPI: Mark [name] is a 59 y.o. African American male presents with worsening  Depression related to PTSD symptoms.  Patient reports he recently discharged from inpatient admission from the [place].  Due to worsening suicidal thoughts.  Reports he did not feel as if his antidepressants and anxiety medications was very effective.  States he is unsure of which medication he was titrated off of however states he was prescribed 21 medication however he is now taking 8 pills only.  Continues to report anxiety 9 out of 10 with 10 being the worst.  Reports his children continue to be supportive.  Patient has completed partial hospitalization programming in the past.  Reports he is followed by a therapist Research scientist (medical). Patient was enrolled in partial psychiatric program on 04/01/19.  Primary complaints include: anxiety, depression worse, feeling depressed, feeling suicidal, poor concentration and problem with medication.   Onset of symptoms was gradual with gradually worsening course since that time. Psychosocial Stressors include the following: family and health.   I have reviewed the following documentation dated 04/03/2019: past psychiatric history, past medical history and past Review of systems  Complaints of Pain: nonear Past Psychiatric History:  Past psychiatric hospitalizations , Previous suicide attempts and Past medication trials Buspar, Wellbutrin 300 mg, Lamictal, Trazodone and Lexapro   Currently in treatment with see above  Substance Abuse History: Prescription medicaitons none Use of Alcohol: denied Use of Caffeine: denies use Use of over the counter:   Past Surgical History:  Procedure Laterality Date  . HERNIA REPAIR    . LEG AMPUTATION Right   . parenatal cyst      Past Medical History:  Diagnosis Date  . Above knee amputation status, right   . Anxiety   . Depression   . Diabetes mellitus, type II (HCC)   . Hyperlipemia   . Hypertension   . PTSD (post-traumatic stress disorder)   . Shoulder pain    Outpatient Encounter Medications as of 04/01/2019  Medication Sig Note  . atorvastatin (LIPITOR) 80 MG tablet Take 80 mg by mouth daily.   Marland Kitchen buPROPion (WELLBUTRIN XL) 300 MG 24 hr tablet Take 1 tablet (300 mg total) by mouth daily.   . cyclobenzaprine (FLEXERIL) 10 MG tablet Take 10 mg by mouth 2 (two) times daily as needed for muscle spasms.   Marland Kitchen gabapentin (NEURONTIN) 300 MG capsule Take 1 capsule (300 mg total) by mouth 2 (two) times daily.   . insulin aspart protamine- aspart (NOVOLOG MIX 70/30) (70-30) 100 UNIT/ML  injection Inject into the skin. 05/22/2018: 30 units with meal  . Insulin Glargine (LANTUS [place]) Inject into the skin. 05/22/2018: 45 units twice a [name].   . lamoTRIgine (LAMICTAL) 100 MG tablet Take 1 tablet (100 mg total) by mouth daily. (Patient taking differently: Take 200 mg by mouth daily. )   . lisinopril (PRINIVIL,ZESTRIL) 5 MG tablet Take 5 mg by mouth daily.  05/22/2018: Plans to restart   . metFORMIN (GLUCOPHAGE) 1000 MG tablet Take 1,000 mg by mouth 2 (two) times daily with a meal. 05/22/2018: Takes 1-2 times a [name] depending on glucose level per patient report  . oxyCODONE-acetaminophen (PERCOCET) 5-325 MG tablet Take by mouth every 4 (four) hours as needed for severe pain. 05/22/2018: Takes only as needed 1-2 times a [name]   . prazosin (MINIPRESS) 2 MG capsule Take 1 capsule (2 mg total) by mouth at bedtime.   . sildenafil (VIAGRA) 100 MG tablet Take 100 mg by mouth daily as needed for erectile dysfunction.   . traZODone (DESYREL) 50 MG tablet Take 50 mg by mouth at bedtime.    No facility-administered encounter medications on file as of 04/01/2019.   Allergies  Allergen Reactions  . Ozempic (0.25 Or 0.5 Mg-Dose) [Semaglutide(0.25 Or 0.5mg -Dos)] Other (See Comments)    Caused Pancreatitis  . Lyrica [Pregabalin] Rash    Social History   Tobacco Use  . Smoking status: Current Every [name] Smoker    Packs/[name]: 1.50    Years: 35.00    Pack years: 52.50    Types: Cigarettes  . Smokeless tobacco: Never Used  . Tobacco comment: Had quit for 3 years then restarted and back up to 1 pk a [name].  Substance Use Topics  . Alcohol use: Yes    Alcohol/week: 4.0 standard drinks    Types: 2 Cans of beer, 2 Shots of liquor per week    Comment: Reports light drinking weekly.   Functioning Relationships: good support system Education: Other Pertinent History: None Family History  Problem Relation Age of Onset  . Aneurysm Mother   . Leukemia Father   . Dementia Maternal Aunt   . Seizures Paternal Uncle      Review of Systems Constitutional: negative  Objective:  There were no vitals filed for this visit.  Physical Exam:   Mental Status Exam: Appearance:  Well groomed observed sitting in wheelchair  Psychomotor::  Within Normal Limits Attention span and concentration: Normal Behavior: calm and cooperative Speech:  normal pitch Mood:  depressed  and anxious Affect:  normal Thought Process:  Coherent Thought Content:  Logical Orientation:  person, place and time/date Cognition:  grossly intact Insight:  Intact Judgment:  Intact Estimate of Intelligence: Average Fund of knowledge: Aware of current events Memory: Recent and remote intact Abnormal movements: None Gait and station: Normal  Assessment:  Diagnosis: No primary diagnosis found. No diagnosis found.  Indications for admission: inpatient care required if not in partial hospital program  Plan: orders places with occupational therapy  patient enrolled in Partial Hospitalization Program, patient's current medications are to be continued, a comprehensive treatment plan will be developed and side effects of medications have been reviewed with patient  Treatment options and alternatives reviewed with patient and patient understands the above plan.Treatmen plan was reviewed and agree upon my NP. T.Chessa Barrasso and patient Giovonnie Trettel need for group services   Derrill Center, NP

## 2019-04-02 ENCOUNTER — Other Ambulatory Visit (HOSPITAL_COMMUNITY): Payer: No Typology Code available for payment source

## 2019-04-02 ENCOUNTER — Other Ambulatory Visit: Payer: Self-pay

## 2019-04-02 ENCOUNTER — Other Ambulatory Visit (HOSPITAL_COMMUNITY): Payer: No Typology Code available for payment source | Admitting: Licensed Clinical Social Worker

## 2019-04-02 DIAGNOSIS — F332 Major depressive disorder, recurrent severe without psychotic features: Secondary | ICD-10-CM | POA: Diagnosis not present

## 2019-04-03 ENCOUNTER — Encounter (HOSPITAL_COMMUNITY): Payer: Self-pay

## 2019-04-03 ENCOUNTER — Other Ambulatory Visit (HOSPITAL_COMMUNITY): Payer: No Typology Code available for payment source | Admitting: Specialist

## 2019-04-03 ENCOUNTER — Other Ambulatory Visit: Payer: Self-pay

## 2019-04-03 ENCOUNTER — Other Ambulatory Visit (HOSPITAL_COMMUNITY): Payer: No Typology Code available for payment source | Admitting: Licensed Clinical Social Worker

## 2019-04-03 DIAGNOSIS — F332 Major depressive disorder, recurrent severe without psychotic features: Secondary | ICD-10-CM | POA: Diagnosis not present

## 2019-04-03 DIAGNOSIS — R4589 Other symptoms and signs involving emotional state: Secondary | ICD-10-CM

## 2019-04-03 DIAGNOSIS — F411 Generalized anxiety disorder: Secondary | ICD-10-CM

## 2019-04-03 DIAGNOSIS — F4310 Post-traumatic stress disorder, unspecified: Secondary | ICD-10-CM

## 2019-04-03 DIAGNOSIS — F331 Major depressive disorder, recurrent, moderate: Secondary | ICD-10-CM

## 2019-04-03 NOTE — Therapy (Signed)
Virtual Visit via Video Note  I connected with Mark [name] on 04/03/19 at  8:00 AM EST by a video enabled telemedicine application and verified that I am speaking with the correct person using two identifiers.   I discussed the limitations of evaluation and management by telemedicine and the availability of in person appointments. The patient expressed understanding and agreed to proceed.    I discussed the assessment and treatment plan with the patient. The patient was provided an opportunity to ask questions and all were answered. The patient agreed with the plan and demonstrated an understanding of the instructions.   The patient was advised to call back or seek an in-person evaluation if the symptoms worsen or if the condition fails to improve as anticipated.  I provided 60 minutes of non-face-to-face time during this encounter.     Mark [name], Mark [name], Mark [name] Phone: [phone]   Fax:  [phone]  Occupational Therapy Treatment  Patient Details  Name: Mark [name] MRN: [id number] Date of Birth: 30-Dec-1960 Referring Provider (OT): Ricky Ala, NP   Encounter Date: 04/03/2019  OT End of Session - 04/03/19 1603    Visit Number  2    Number of Visits  8    Date for OT Re-Evaluation  04/28/19    Authorization Type  [facility] and [employer]    Progress Note Due on Visit  10    OT Start Time  1100    OT Stop Time  1200    OT Time Calculation (min)  60 min    Activity Tolerance  Patient tolerated treatment well    Behavior During Therapy  [facility] for tasks assessed/performed       Past Medical History:  Diagnosis Date  . Above knee amputation status, right   . Anxiety   . Depression   . Diabetes mellitus, type II ([place])   . Hyperlipemia   . Hypertension   . PTSD (post-traumatic stress disorder)   . Shoulder pain     Past Surgical History:  Procedure Laterality Date  . HERNIA REPAIR    . LEG  AMPUTATION Right   . parenatal cyst      There were no vitals filed for this visit.  Subjective Assessment - 04/03/19 1603    Currently in Pain?  No/denies             S: I know what I need to do, I just procrastinate.  O:  Group opened with review of time management techniques, which are useful when we begin to work towards improved Marine scientist.  The members defined accountability.  Leader then explained definition of personal accountability, each individual being responsible for their own actions and holding themselves to this expected standard.  Personal accountability is doing what you know you should do, when you should do it.  Group discussed if accountability was a positive or negative theme for them and why they had this view point.  Group was educated on three type of accountability:  actions and choices, responsibilities, and goals.  Group provided examples of each accountability and identified the type that they most struggle with.  Leader identified the use of a daily prioritized to do list as a successful tool in improving accountability.  Group members discussed ways to prioritize the list. Group was educated on 3 barriers to achieving accountability including shame, blame, and justification and group worked on identifying examples of each.  OT encouraged members  to play "shame/blame/justification" game at home, placing a predetermined amount of money in a jar each time they engage in these accountability barriers.  Group was educated on two techniques to use to make accountability more feasible to obtain:  Writing down your goal/task and sharing your accountability promise with another person.  Group viewed a TED talk on accountability and explored consequences as a result of both action and inaction, as well as their beliefs in the potential for ourselves being the cause of some distress in our lives.   Group then explored the theme that one must desire accountability in  order to follow through and be successful.  One's values must be considered when making accountability goals.  Each group member spent time reflecting on their work, relationship, Editor, commissioning, and leisure values.  They used a bull's eye to conceptualilze where they were at "today" in relation to their values.   A:  Mark [name] was present and participated in group this date.  He voiced that it's easy for him not to hold himself accountable because he is the only one knows what the goal was. OT recommended reaching out to an accountability partner - he stated he has 3 choices, me, myself, and I.   He identifies that his values are not aligned with work and education goals.    P:  Continue skilled OT group 2 times per week for 3 weeks in order to improve coping and psychosocial skilled needed for improved community reintegration. next session to focus on SMART goal setting.               OT Education - 04/03/19 1603    Education Details  reviewed tips for improved personal accountabiltiy    Person(s) Educated  Patient    Methods  Explanation;Handout    Comprehension  Verbalized understanding       OT Short Term Goals - 04/03/19 1604      OT SHORT TERM GOAL #1   Title  Pt will be educated on strategies to improve psychosocial skills needed to participate fully in all daily, work, and leisure activities    Time  4    Period  Weeks    Status  On-going    Target Date  04/28/19      OT SHORT TERM GOAL #2   Title  Pt will apply psychosocial skills and coping mechanisms to daily activities in order to function independently and reintegrate into community    Time  4    Period  Weeks    Status  On-going      OT SHORT TERM GOAL #3   Title  Pt will recall and/or apply 1-3 sleep hygiene strategies to improve BADL functioning prior to reintegrating into community    Time  4    Period  Weeks    Status  On-going      OT SHORT TERM GOAL #4   Title  Pt will engage in goal setting to  improve functional BADL/IADL routine upon reintegrating into community    Time  4    Period  Weeks    Status  On-going      OT SHORT TERM GOAL #5   Title  Pt will create and/or implement functional routine to improve successful engagement in daily tasks prior to reintegrating into community    Time  4    Period  Weeks    Status  On-going  Plan - 04/03/19 1604    Body Structure / Function / Physical Skills  ADL    Cognitive Skills  Emotional;Energy/Drive;Thought    Psychosocial Skills  Coping Strategies;Environmental  Adaptations;Habits;Interpersonal Interaction;Routines and Behaviors       Patient will benefit from skilled therapeutic intervention in order to improve the following deficits and impairments:   Body Structure / Function / Physical Skills: ADL Cognitive Skills: Emotional, Energy/Drive, Thought Psychosocial Skills: Coping Strategies, Environmental  Adaptations, Habits, Interpersonal Interaction, Routines and Behaviors   Visit Diagnosis: PTSD (post-traumatic stress disorder)  Generalized anxiety disorder  Difficulty coping    Problem List Patient Active Problem List   Diagnosis Date Noted  . PTSD (post-traumatic stress disorder) 04/15/2017  . Major depressive disorder, recurrent episode, moderate (HCC) 04/30/2016  . Generalized anxiety disorder 04/30/2016    Shirlean Mylar, MHA, OTR/L [phone]  04/03/2019, 4:04 PM  [facility] PARTIAL HOSPITALIZATION PROGRAM [address] [place], [place], [address] Phone: [phone]   Fax:  [phone]  Mark [name] MRN: [id number] Date of Birth: Feb 15, 1960

## 2019-04-06 ENCOUNTER — Other Ambulatory Visit: Payer: Self-pay

## 2019-04-06 ENCOUNTER — Other Ambulatory Visit (HOSPITAL_COMMUNITY): Payer: No Typology Code available for payment source | Admitting: Licensed Clinical Social Worker

## 2019-04-06 ENCOUNTER — Ambulatory Visit (HOSPITAL_COMMUNITY): Payer: No Typology Code available for payment source | Admitting: Licensed Clinical Social Worker

## 2019-04-06 ENCOUNTER — Encounter (HOSPITAL_COMMUNITY): Payer: Self-pay | Admitting: Licensed Clinical Social Worker

## 2019-04-06 DIAGNOSIS — F332 Major depressive disorder, recurrent severe without psychotic features: Secondary | ICD-10-CM

## 2019-04-06 DIAGNOSIS — F411 Generalized anxiety disorder: Secondary | ICD-10-CM

## 2019-04-06 DIAGNOSIS — F4310 Post-traumatic stress disorder, unspecified: Secondary | ICD-10-CM

## 2019-04-06 NOTE — Progress Notes (Signed)
Virtual Visit via Video Note  I connected with Mark [name] on 04/06/19 at  5:00 PM EDT by a video enabled telemedicine application and verified that I am speaking with the correct person using two identifiers.   I discussed the limitations of evaluation and management by telemedicine and the availability of in person appointments. The patient expressed understanding and agreed to proceed.  History of Present Illness: [facility], anxiety[facility]    Observations/Objective:Patient participated in a discussion on fear, managing fear,  helping to gradually change the interconnectedness of thoughts, beliefs, feelings, and behaviors, counteracting thought patterns. Patient was encouraged to counteract thought patterns including fear.     Assessment and Plan: Counselor will continue to meet with patient to address treatment plan goals. Patient will continue to follow recommendations of providers and implement skills learned in session.   Follow Up Instructions: I discussed the assessment and treatment plan with the patient. The patient was provided an opportunity to ask questions and all were answered. The patient agreed with the plan and demonstrated an understanding of the instructions.   The patient was advised to call back or seek an in-person evaluation if the symptoms worsen or if the condition fails to improve as anticipated.  I provided 120 minutes of non-face-to-face time during this encounter.   [name] S, LCAS

## 2019-04-07 ENCOUNTER — Other Ambulatory Visit: Payer: Self-pay

## 2019-04-07 ENCOUNTER — Other Ambulatory Visit (HOSPITAL_COMMUNITY): Payer: No Typology Code available for payment source | Admitting: Specialist

## 2019-04-07 ENCOUNTER — Encounter (HOSPITAL_COMMUNITY): Payer: Self-pay

## 2019-04-07 ENCOUNTER — Other Ambulatory Visit (HOSPITAL_COMMUNITY): Payer: No Typology Code available for payment source | Admitting: Licensed Clinical Social Worker

## 2019-04-07 DIAGNOSIS — F332 Major depressive disorder, recurrent severe without psychotic features: Secondary | ICD-10-CM

## 2019-04-07 DIAGNOSIS — F4310 Post-traumatic stress disorder, unspecified: Secondary | ICD-10-CM

## 2019-04-07 DIAGNOSIS — F411 Generalized anxiety disorder: Secondary | ICD-10-CM

## 2019-04-07 DIAGNOSIS — R41844 Frontal lobe and executive function deficit: Secondary | ICD-10-CM

## 2019-04-07 DIAGNOSIS — R4589 Other symptoms and signs involving emotional state: Secondary | ICD-10-CM

## 2019-04-07 NOTE — Progress Notes (Signed)
Spoke with patient via Webex video call, used 2 identifiers to correctly identify patient. States that groups are going well, taking a lot of notes. He would like to continue with PHP for another 10 days and has asked Boneta Lucks to contact the [place] for more authorized days. He feels anxiety when he thinks about leaving group.Takes Vistaril for anxiety. Has a lot of hopelessness but denies SI/HI or AV hallucinations. On scale 1-10 as 10 being worst he rates depression at 6 and anxiety at 8/9. No side effects. No issue or complaints. Not expecting discharge this week.

## 2019-04-07 NOTE — Progress Notes (Signed)
Virtual Visit via Video Note  I connected with Mark [name] on 04/08/19 at  9:00 AM EDT by a video enabled telemedicine application and verified that I am speaking with the correct person using two identifiers.   I discussed the limitations of evaluation and management by telemedicine and the availability of in person appointments. The patient expressed understanding and agreed to proceed.    I discussed the assessment and treatment plan with the patient. The patient was provided an opportunity to ask questions and all were answered. The patient agreed with the plan and demonstrated an understanding of the instructions.   The patient was advised to call back or seek an in-person evaluation if the symptoms worsen or if the condition fails to improve as anticipated.  I provided 15 minutes of non-face-to-face time during this encounter.   Derrill Center, NP   BH MD/PA/NP OP Progress Note  04/08/2019 8:06 AM Mark [name]  MRN:  [id number]   Evaluation: Mark [name] was seen and evaluated via WebEx.  He is alert and oriented x3.  Presents pleasant, calm and cooperative.  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  Rates his depression 6 out of 10 with 10 being the worst.  States his anxiety could be better as he rates his anxiety 8 out of 10.  Reported low motivation denied any medication side effects.  Mark [name] reported " group continues to be helpful to reiterate skills that he has learned in the past."  Mark [name] feeling slightly better since some of his medications have been discontinued.  Patient [name] he is hopeful and eager to follow-up with Dr. Adele Schilder and he continues to see Charolotte Eke for therapy.  [name] a good appetite.  States he is resting better throughout the night.  Support, encouragement and  reassurance was provided.  Visit  Diagnosis:    ICD-10-CM   1. MDD (major depressive disorder), recurrent severe, without psychosis (Mark [name])  F33.2   2. Generalized anxiety disorder  F41.1   3. PTSD (post-traumatic stress disorder)  F43.10     Past Psychiatric History:   Past Medical History:  Past Medical History:  Diagnosis Date  . Above knee amputation status, right   . Anxiety   . Depression   . Diabetes mellitus, type II ([place])   . Hyperlipemia   . Hypertension   . PTSD (post-traumatic stress disorder)   . Shoulder pain     Past Surgical History:  Procedure Laterality Date  . HERNIA REPAIR    . LEG AMPUTATION Right   . parenatal cyst      Family Psychiatric History:   Family History:  Family History  Problem Relation Age of Onset  . Aneurysm Mother   . Leukemia Father   . Dementia Maternal Aunt   . Seizures Paternal Uncle     Social History:  Social History   Socioeconomic History  . Marital status: Married    Spouse name: Not on file  . Number of children: Not on file  . Years of education: Not on file  . Highest education level: Not on file  Occupational History  . Not on file  Tobacco Use  . Smoking status: Current Every Day Smoker    Packs/day: 1.50    Years: 35.00    Pack years: 52.50    Types: Cigarettes  .  Smokeless tobacco: Never Used  . Tobacco comment: Had quit for 3 years then restarted and back up to 1 pk a day.  Substance and Sexual Activity  . Alcohol use: Yes    Alcohol/week: 4.0 standard drinks    Types: 2 Cans of beer, 2 Shots of liquor per week    Comment: [name] light drinking weekly.  . Drug use: Yes    Frequency: 14.0 times per week    Types: Marijuana    Comment: Uses 1-2 times a day per report for his anxiety  . Sexual activity: Not Currently  Other Topics Concern  . Not on file  Social History Narrative  . Not on file   Social Determinants of Health   Financial Resource Strain: Low Risk   . Difficulty of Paying Living Expenses: Not very hard  Food  Insecurity: No Food Insecurity  . Worried About Programme researcher, broadcasting/film/video in the Last Year: Never true  . Ran Out of Food in the Last Year: Never true  Transportation Needs: No Transportation Needs  . Lack of Transportation (Medical): No  . Lack of Transportation (Non-Medical): No  Physical Activity: Insufficiently Active  . Days of Exercise per Week: 2 days  . Minutes of Exercise per Session: 20 min  Stress: Stress Concern Present  . Feeling of Stress : Very much  Social Connections: Slightly Isolated  . Frequency of Communication with Friends and Family: More than three times a week  . Frequency of Social Gatherings with Friends and Family: Never  . Attends Religious Services: More than 4 times per year  . Active Member of Clubs or Organizations: Yes  . Attends Banker Meetings: More than 4 times per year  . Marital Status: Separated    Allergies:  Allergies  Allergen Reactions  . Ozempic (0.25 Or 0.5 Mg-Dose) [Semaglutide(0.25 Or 0.5mg -Dos)] Other (See Comments)    Caused Pancreatitis  . Lyrica [Pregabalin] Rash    Metabolic Disorder Labs: No results found for: HGBA1C, MPG No results found for: PROLACTIN No results found for: CHOL, TRIG, HDL, CHOLHDL, VLDL, LDLCALC No results found for: TSH  Therapeutic Level Labs: No results found for: LITHIUM No results found for: VALPROATE No components found for:  CBMZ  Current Medications: Current Outpatient Medications  Medication Sig Dispense Refill  . busPIRone (BUSPAR) 10 MG tablet Take 10 mg by mouth 2 (two) times daily.    . carvedilol (COREG) 12.5 MG tablet Take 12.5 mg by mouth 2 (two) times daily with a meal.    . diltiazem (TIAZAC) 360 MG 24 hr capsule Take 360 mg by mouth daily.    Marland Kitchen escitalopram (LEXAPRO) 20 MG tablet Take 20 mg by mouth daily.    . hydrOXYzine (ATARAX/VISTARIL) 25 MG tablet Take 25 mg by mouth 2 (two) times daily as needed.    . insulin aspart protamine- aspart (NOVOLOG MIX 70/30) (70-30) 100  UNIT/ML injection Inject into the skin.    . Insulin Glargine (LANTUS [place]) Inject into the skin.    Marland Kitchen lisinopril (PRINIVIL,ZESTRIL) 5 MG tablet Take 5 mg by mouth daily.    . methocarbamol (ROBAXIN) 500 MG tablet Take 500 mg by mouth 4 (four) times daily.    . modafinil (PROVIGIL) 100 MG tablet Take 100 mg by mouth daily.    Marland Kitchen oxyCODONE-acetaminophen (PERCOCET) 5-325 MG tablet Take by mouth every 4 (four) hours as needed for severe pain.    . prazosin (MINIPRESS) 2 MG capsule Take 1 capsule (2 mg total)  by mouth at bedtime. 90 capsule 0  . sildenafil (VIAGRA) 100 MG tablet Take 100 mg by mouth daily as needed for erectile dysfunction.    . traZODone (DESYREL) 50 MG tablet Take 25 mg by mouth at bedtime.     Marland Kitchen atorvastatin (LIPITOR) 80 MG tablet Take 80 mg by mouth daily.    Marland Kitchen buPROPion (WELLBUTRIN XL) 300 MG 24 hr tablet Take 1 tablet (300 mg total) by mouth daily. (Patient not taking: Reported on 04/07/2019) 90 tablet 0  . cyclobenzaprine (FLEXERIL) 10 MG tablet Take 10 mg by mouth 2 (two) times daily as needed for muscle spasms.    Marland Kitchen gabapentin (NEURONTIN) 300 MG capsule Take 1 capsule (300 mg total) by mouth 2 (two) times daily. (Patient not taking: Reported on 04/01/2019) 180 capsule 0  . lamoTRIgine (LAMICTAL) 100 MG tablet Take 1 tablet (100 mg total) by mouth daily. (Patient not taking: Reported on 04/01/2019) 90 tablet 0  . metFORMIN (GLUCOPHAGE) 1000 MG tablet Take 1,000 mg by mouth 2 (two) times daily with a meal.     No current facility-administered medications for this visit.     Musculoskeletal:  Psychiatric Specialty Exam: Review of Systems  There were no vitals taken for this visit.There is no height or weight on file to calculate BMI.  General Appearance: Casual observed sitting in his wheelchair  Eye Contact:  Good  Speech:  Clear and Coherent  Volume:  Normal  Mood:  Anxious and Depressed  Affect:  Congruent  Thought Process:  Coherent  Orientation:  Full (Time, Place,  and Person)  Thought Content: Hallucinations: None   Suicidal Thoughts:  No  Homicidal Thoughts:  No  Memory:  Immediate;   Fair Recent;   Fair  Judgement:  Fair  Insight:  Fair  Psychomotor Activity:  Normal  Concentration:  Concentration: Fair  Recall:  Fair  Fund of Knowledge: Good  Language: Fair  Akathisia:  No  Handed:  Right  AIMS (if indicated):   Assets:  Communication Skills Desire for Improvement Resilience Social Support  ADL's:  Intact  Cognition: WNL  Sleep:  Fair   Screenings: GAD-7     Counselor from 06/03/2017 in BEHAVIORAL HEALTH OUTPATIENT THERAPY [place]  Total GAD-7 Score  12    PHQ2-9     Counselor from 04/01/2019 in BEHAVIORAL HEALTH PARTIAL HOSPITALIZATION PROGRAM Counselor from 06/12/2018 in BEHAVIORAL HEALTH PARTIAL HOSPITALIZATION PROGRAM Counselor from 06/05/2018 in BEHAVIORAL HEALTH PARTIAL HOSPITALIZATION PROGRAM Counselor from 05/22/2018 in BEHAVIORAL HEALTH PARTIAL HOSPITALIZATION PROGRAM Counselor from 11/27/2017 in BEHAVIORAL HEALTH OUTPATIENT THERAPY [place]  PHQ-2 Total Score  6  4  3  6  6   PHQ-9 Total Score  17  14  15  24  21        Assessment and Plan:  Continue partial hospitalization programming Continue medications as directed  Treatment plan was reviewed and agreed upon by NP T. and patient Chijioke Lasser need for continued group services  Melvyn Neth, NP 04/08/2019, 8:06 AM

## 2019-04-07 NOTE — Therapy (Signed)
Virtual Visit via Video Note  I connected with Mark [name] on 04/07/19 at  11:00 AM EDT by a video enabled telemedicine application and verified that I am speaking with the correct person using two identifiers.   I discussed the limitations of evaluation and management by telemedicine and the availability of in person appointments. The patient expressed understanding and agreed to proceed.    I discussed the assessment and treatment plan with the patient. The patient was provided an opportunity to ask questions and all were answered. The patient agreed with the plan and demonstrated an understanding of the instructions.   The patient was advised to call back or seek an in-person evaluation if the symptoms worsen or if the condition fails to improve as anticipated.  I provided 60 minutes of non-face-to-face time during this encounter.   Shirlean Mylar, MHA, OTR/L [phone]   [facility] HOSPITALIZATION PROGRAM [address] [place], [place], [address] Phone: [phone]   Fax:  [phone]  Occupational Therapy Treatment  Patient Details  Name: Mark [name] MRN: [id number] Date of Birth: 1960/09/06 Referring Provider (OT): Hillery Jacks, NP   Encounter Date: 04/07/2019  OT End of Session - 04/07/19 2107    Visit Number  3    Number of Visits  8    Date for OT Re-Evaluation  04/28/19    Authorization Type  [employer] and [employer]    Progress Note Due on Visit  10    OT Start Time  1100    OT Stop Time  1200    OT Time Calculation (min)  60 min    Activity Tolerance  Patient tolerated treatment well    Behavior During Therapy  [facility] for tasks assessed/performed       Past Medical History:  Diagnosis Date  . Above knee amputation status, right   . Anxiety   . Depression   . Diabetes mellitus, type II ([facility])   . Hyperlipemia   . Hypertension   . PTSD (post-traumatic stress disorder)   . Shoulder pain     Past Surgical History:  Procedure  Laterality Date  . HERNIA REPAIR    . LEG AMPUTATION Right   . parenatal cyst      There were no vitals filed for this visit.  Subjective Assessment - 04/07/19 2106    Currently in Pain?  No/denies         S:  Procrastination gets in the way of me setting goals.  O:  Patient was engaged in group this date. Group focused on Personal goal setting this date.  Group discussed why goals are set, benefits of setting goals, and roadblocks to setting goals.  Group explored areas of life that could be focused on when setting goals, including career, financial, education, family, atristic, attitude, physical, pleasure, and public service.  Group was educated to set a long term goal or big picture goal, which had several medium and short term goals that supported forward progress towards this long term goal.  Group was educated on the SMART framework to use when formulating goals, which insures goals are specific, measurable, attainable/action oriented, relevant/rewarding, time-bound.  Group was educated on other tips for goal setting including stating each goal as an action statement, be precise, set priorities, write down goals, set performance versus outcomes goals, set realistic goals.  We discussed reflecting on long term goals regularly.  Group was educated on achieving goals, insuring that they celebrate their successes and constantly  use continuous improvement methods to adjust goals as needed.  Further, failure to meet a goal should not be seen negatively, rather use this experience to adjust the goal in order to make the goal achievable.  Group members then used the SMART framework to create one long term goal.    A:  Mark [name] was engaged throughout group, sharing that procrastination gets in the way of him setting personal goals.  He shared a goal of improving his health, with short term goals that were very specific, but not measurable or time bound.  OT encouraged him to think about his long term goal  using SMART, making it more specific, time bound, and measurable, for example I will have an A1C score of less than 7 by December 2022 for overall improved health.  Mark [name] was receptive to this feedback. P:  Continue skilled OT intervention 2 times per week.  Follow up on progress of creating supporting medium and short term goals.  Next group to focus on the circle of control coping strategy.                      OT Education - 04/07/19 2106    Education Details  reviewed presonal goal setting and SMART framework.    Person(s) Educated  Patient    Methods  Explanation;Handout    Comprehension  Verbalized understanding       OT Short Term Goals - 04/03/19 1604      OT SHORT TERM GOAL #1   Title  Pt will be educated on strategies to improve psychosocial skills needed to participate fully in all daily, work, and leisure activities    Time  4    Period  Weeks    Status  On-going    Target Date  04/28/19      OT SHORT TERM GOAL #2   Title  Pt will apply psychosocial skills and coping mechanisms to daily activities in order to function independently and reintegrate into community    Time  4    Period  Weeks    Status  On-going      OT SHORT TERM GOAL #3   Title  Pt will recall and/or apply 1-3 sleep hygiene strategies to improve BADL functioning prior to reintegrating into community    Time  4    Period  Weeks    Status  On-going      OT SHORT TERM GOAL #4   Title  Pt will engage in goal setting to improve functional BADL/IADL routine upon reintegrating into community    Time  4    Period  Weeks    Status  On-going      OT SHORT TERM GOAL #5   Title  Pt will create and/or implement functional routine to improve successful engagement in daily tasks prior to reintegrating into community    Time  4    Period  Weeks    Status  On-going               Plan - 04/07/19 2107    Body Structure / Function / Physical Skills  ADL    Cognitive Skills   Emotional;Energy/Drive;Thought    Psychosocial Skills  Coping Strategies;Environmental  Adaptations;Habits;Interpersonal Interaction;Routines and Behaviors       Patient will benefit from skilled therapeutic intervention in order to improve the following deficits and impairments:   Body Structure / Function / Physical Skills: ADL Cognitive Skills: Emotional, Energy/Drive, Thought  Psychosocial Skills: Coping Strategies, Environmental  Adaptations, Habits, Interpersonal Interaction, Routines and Behaviors   Visit Diagnosis: MDD (major depressive disorder), recurrent severe, without psychosis ([place])  Generalized anxiety disorder  PTSD (post-traumatic stress disorder)  Difficulty coping  Executive function deficit    Problem List Patient Active Problem List   Diagnosis Date Noted  . PTSD (post-traumatic stress disorder) 04/15/2017  . Major depressive disorder, recurrent episode, moderate ([place]) 04/30/2016  . Generalized anxiety disorder 04/30/2016    Vangie Bicker, [place], OTR/L [phone]  04/07/2019, 9:08 PM  [facility] PARTIAL HOSPITALIZATION PROGRAM [place], [place], [address] Phone: [phone]   Fax:  [phone]  Name: Mark [name] MRN: [id number] Date of Birth: Jul 21, 1960

## 2019-04-08 ENCOUNTER — Encounter (HOSPITAL_COMMUNITY): Payer: Self-pay | Admitting: Family

## 2019-04-08 ENCOUNTER — Other Ambulatory Visit (HOSPITAL_COMMUNITY): Payer: No Typology Code available for payment source | Admitting: Licensed Clinical Social Worker

## 2019-04-08 ENCOUNTER — Other Ambulatory Visit: Payer: Self-pay

## 2019-04-08 DIAGNOSIS — F4310 Post-traumatic stress disorder, unspecified: Secondary | ICD-10-CM

## 2019-04-08 DIAGNOSIS — F411 Generalized anxiety disorder: Secondary | ICD-10-CM

## 2019-04-08 DIAGNOSIS — F332 Major depressive disorder, recurrent severe without psychotic features: Secondary | ICD-10-CM | POA: Diagnosis not present

## 2019-04-08 NOTE — Psych (Signed)
Virtual Visit via Video Note  I connected with [name] on 04/01/19 at  9:00 AM EST by a video enabled telemedicine application and verified that I am speaking with the correct person using two identifiers.   I discussed the limitations of evaluation and management by telemedicine and the availability of in person appointments. The patient expressed understanding and agreed to proceed.   I discussed the assessment and treatment plan with the patient. The patient was provided an opportunity to ask questions and all were answered. The patient agreed with the plan and demonstrated an understanding of the instructions.   The patient was advised to call back or seek an in-person evaluation if the symptoms worsen or if the condition fails to improve as anticipated.  Pt was provided 240 minutes of non-face-to-face time during this encounter.   Lorin Glass, LCSW    [facility] PHP THERAPIST PROGRESS NOTE  Baden Betsch [id number]  Session Time: 9:00 - 10:00  Participation Level: Active  Behavioral Response: CasualAlertDepressed  Type of Therapy: Group Therapy  Treatment Goals addressed: Coping  Interventions: CBT, DBT, Supportive and Reframing  Summary: Clinician led check-in regarding current stressors and situation, and review of patient completed daily inventory. Clinician utilized active listening and empathetic response and validated patient emotions. Clinician facilitated processing group on pertinent issues  Therapist Response: Marik Sedore is a 59 y.o. male who presents with depression and anxiety symptoms.  Patient arrived within time allowed and reports that he isfeeling "okay."Patient rates his mood at a 5 on a scale of 1-10 with 10 being great. Pt reports he did not sleep well due to forgetting to take his sleep medication last night. Pt reports he did some chores, cooked dinner, and watched tv yesterday. Pt shares he received his diploma for his master's degree in the mail  and it was a bright spot in his afternoon. Pt struggles with disqualifying the positive. Pt engaged in discussion.       Session Time: 10:00 -11:00  Participation Level:Active  Behavioral Response:CasualAlertDepressed  Type of Therapy: Group Therapy, psychoeducation, psychotherapy  Treatment Goals addressed: Coping  Interventions:CBT, DBT, Solution Focused, Supportive and Reframing  Summary:Cln led discussion on routine and how not having one is affecting group members. Group discussed the reasons they have for wanting routine and the ways in which it feels most important. Cln introduced different ways to establish routine such as by time or pattern and whether each day needs to be the same as well as how to handle disruptions in routine.   Therapist Response: Patient states establishing a routine is very important to him because he does not want to "slip into old habits." Pt reports barriers to establishing routine in the past include multiple appointmenets and inconsistencies in his day. Pt able to problem solve.       Session Time: 11:00 -12:00  Participation Level:Active  Behavioral Response:CasualAlertDepressed  Type of Therapy: Group Therapy, psychotherapy  Treatment Goals addressed: Coping  Interventions:Strengths based, reframing, Supportive,   Summary:Spiritual Care group  Therapist Response: Patient engaged in group. See chaplain note.        Session Time: 12:00- 1:00  Participation Level:Active  Behavioral Response:CasualAlertDepressed  Type of Therapy: Group Therapy, Psychoeducation  Treatment Goals addressed: Coping  Interventions:relaxation training; Supportive; Reframing  Summary:12:00 - 12:50: Relaxation group: Cln led group focused on retraining the body's response to stress.  12:50 -1:00 Clinician led check-out. Clinician assessed for immediate needs, medication compliance and efficacy, and  safety concerns  Therapist Response:Pt  engaged in activity and discussion. At check-out, patient rates hismood at [facility] a scale of 1-10 with 10 being great. Patient reports afternoon plans of working on a routine and cleaning his desk. Patient demonstrates some progress as evidenced by being able to celebrate his achievement. Patient denies SI/HI/self-harm thoughts at the end of group.      Suicidal/Homicidal: Nowithout intent/plan   Plan: Pt will continue in PHP while working to increase stabilization post-hospital discharge, decrease depression and anxiety symptoms, and increase ability to manage symptoms in a healthy manner.   Diagnosis: MDD (major depressive disorder), recurrent severe, without psychosis ([facility]) [F33.2]    1. MDD (major depressive disorder), recurrent severe, without psychosis ([facility])   2. PTSD (post-traumatic stress disorder)   3. Generalized anxiety disorder       [name], LCSW 04/08/2019

## 2019-04-08 NOTE — Psych (Signed)
Virtual Visit via Video Note  I connected with Mark [name] on 03/31/19 at  9:00 AM EST by a video enabled telemedicine application and verified that I am speaking with the correct person using two identifiers.   I discussed the limitations of evaluation and management by telemedicine and the availability of in person appointments. The patient expressed understanding and agreed to proceed.   I discussed the assessment and treatment plan with the patient. The patient was provided an opportunity to ask questions and all were answered. The patient agreed with the plan and demonstrated an understanding of the instructions.   The patient was advised to call back or seek an in-person evaluation if the symptoms worsen or if the condition fails to improve as anticipated.  Pt was provided 240 minutes of non-face-to-face time during this encounter.   Lorin Glass, LCSW    [facility] PHP THERAPIST PROGRESS NOTE  Mark [name] [id number]  Session Time: 9:00 - 10:00  Participation Level: Active  Behavioral Response: CasualAlertDepressed  Type of Therapy: Group Therapy  Treatment Goals addressed: Coping  Interventions: CBT, DBT, Supportive and Reframing  Summary: Clinician led check-in regarding current stressors and situation, and review of patient completed daily inventory. Clinician utilized active listening and empathetic response and validated patient emotions. Clinician facilitated processing group on pertinent issues  Therapist Response: Mark [name] is a 59 y.o. male who presents with depression and anxiety symptoms.  Patient arrived within time allowed and reports that he isfeeling "anxious." Patient rates his mood at a 4 on a scale of 1-10 with 10 being great. Pt reports he "didn't do much" yesterday and has been experiencing heightened anxiety since discharge from his inpatient psychiatric admission the end of last week. Pt reports feeling frustration that he is unable to manage these  symptoms and expresses self-judgment that he is "back in this place." Pt reports decline in hygiene, difficulty making meals and eating regularly, and struggles with motivation. Pt able to process. Pt engaged in discussion.        Session Time: 10:00-11:00  Participation Level:Active  Behavioral Response:CasualAlertDepressed  Type of Therapy: Group Therapy  Treatment Goals addressed: Coping  Interventions:CBT, DBT, Solution Focused, Supportive and Reframing  Summary:Cln led discussion on the ways in which the pandemic is affecting pt's current mental health. Group members discussed increased anxiety, interpersonal conflict, and judgment; feeling out of control; and increased isolation due to decreased outlets. Cln validated pt's experience and discussed ways to manage what is within our control.   Therapist Response: Pt reports noticing the pandemic has had a negative impact on his mental health. Pt reports the isolation and inability to go out and be around people is the biggest stressor. Pt reports he does virtual meetings and interactions "sometimes" but does not find it as helpful as face-to-face. Pt reports increased negative world view as well since the pandemic and seeing the way people are handling it. Pt reports low tolerance for anger feelings. Pt is able to process.       Session Time: 11:00- 12:00  Participation Level:Active  Behavioral Response:CasualAlertDepressed  Type of Therapy: Group Therapy, OT  Treatment Goals addressed: Coping  Interventions:Psychosocial skills training, Supportive,   Summary:Occupational Therapy group  Therapist Response:Patient engaged in group. See OT note.       Session Time: 12:00 -1:00  Participation Level:Active  Behavioral Response:CasualAlertDepressed  Type of Therapy: Group therapy  Treatment Goals addressed: Coping  Interventions:CBT; Solution focused; Supportive;  Reframing  Summary:12:00 - 12:50:Cln introduced the cognitive  model. Cln discussed the cognitive triangle of thoughts, feelings, and behaviors and how they interact with each other. Group worked through examples to reframe problematic thoughts and practiced working backwards from a negative consequence to identify the problematic thoughts and feelings.  12:50 -1:00 Clinician led check-out. Clinician assessed for immediate needs, medication compliance and efficacy, and safety concerns  Therapist Response:12:00 - 12:50:Pt reports understanding of cognitive model. Pt reports some struggles in conceptualizing, however reports examples helped him "get it." Pt states negative thoughts are a problem for him and identifies having high emotional reactivity.  At checkout, pt rates his mood at a 6 on a scale of 1-10 with 10 being great. Patient reports afternoon plans of showering, cooking, and organizing his planner. Patient demonstrates some progress as evidenced by actively participating in first group session. Pt denies SI/HI at end of group.       Suicidal/Homicidal: Nowithout intent/plan   Plan: Pt will continue in PHP while working to increase stabilization post-hospital discharge, decrease depression and anxiety symptoms, and increase ability to manage symptoms in a healthy manner.   Diagnosis: MDD (major depressive disorder), recurrent severe, without psychosis (HCC) [F33.2]    1. MDD (major depressive disorder), recurrent severe, without psychosis (HCC)   2. Generalized anxiety disorder   3. PTSD (post-traumatic stress disorder)       [name], LCSW 04/08/2019

## 2019-04-09 ENCOUNTER — Other Ambulatory Visit (HOSPITAL_COMMUNITY): Payer: No Typology Code available for payment source

## 2019-04-09 ENCOUNTER — Other Ambulatory Visit: Payer: Self-pay

## 2019-04-09 ENCOUNTER — Other Ambulatory Visit (HOSPITAL_COMMUNITY): Payer: No Typology Code available for payment source | Admitting: Licensed Clinical Social Worker

## 2019-04-09 DIAGNOSIS — F332 Major depressive disorder, recurrent severe without psychotic features: Secondary | ICD-10-CM

## 2019-04-09 DIAGNOSIS — F411 Generalized anxiety disorder: Secondary | ICD-10-CM

## 2019-04-09 DIAGNOSIS — F4310 Post-traumatic stress disorder, unspecified: Secondary | ICD-10-CM

## 2019-04-10 ENCOUNTER — Other Ambulatory Visit: Payer: Self-pay

## 2019-04-10 ENCOUNTER — Other Ambulatory Visit (HOSPITAL_COMMUNITY): Payer: No Typology Code available for payment source | Admitting: Specialist

## 2019-04-10 ENCOUNTER — Encounter (HOSPITAL_COMMUNITY): Payer: Self-pay

## 2019-04-10 ENCOUNTER — Other Ambulatory Visit (HOSPITAL_COMMUNITY): Payer: No Typology Code available for payment source | Admitting: Licensed Clinical Social Worker

## 2019-04-10 DIAGNOSIS — F332 Major depressive disorder, recurrent severe without psychotic features: Secondary | ICD-10-CM | POA: Diagnosis not present

## 2019-04-10 DIAGNOSIS — F411 Generalized anxiety disorder: Secondary | ICD-10-CM

## 2019-04-10 DIAGNOSIS — R4589 Other symptoms and signs involving emotional state: Secondary | ICD-10-CM

## 2019-04-10 DIAGNOSIS — F4310 Post-traumatic stress disorder, unspecified: Secondary | ICD-10-CM

## 2019-04-10 DIAGNOSIS — R41844 Frontal lobe and executive function deficit: Secondary | ICD-10-CM

## 2019-04-10 NOTE — Therapy (Signed)
Virtual Visit via Video Note  I connected with Mark [name] on 04/10/19 at 11:00 AM EDT by a video enabled telemedicine application and verified that I am speaking with the correct person using two identifiers.   I discussed the limitations of evaluation and management by telemedicine and the availability of in person appointments. The patient expressed understanding and agreed to proceed.      I discussed the assessment and treatment plan with the patient. The patient was provided an opportunity to ask questions and all were answered. The patient agreed with the plan and demonstrated an understanding of the instructions.   The patient was advised to call back or seek an in-person evaluation if the symptoms worsen or if the condition fails to improve as anticipated.  I provided 55 minutes of non-face-to-face time during this encounter.     [address], [place], [address] Phone: [phone]   Fax:  [phone]  Occupational Therapy Treatment  Patient Details  Name: Mark [name] MRN: [id number] Date of Birth: 05/10/1960 Referring Provider (OT): Ricky Ala, NP   Encounter Date: 04/10/2019  OT End of Session - 04/10/19 2132    Visit Number  4    Number of Visits  8    Date for OT Re-Evaluation  04/28/19    Authorization Type  [employer] and [employer]    Progress Note Due on Visit  10    OT Start Time  1100    OT Stop Time  1155    OT Time Calculation (min)  55 min    Activity Tolerance  Patient tolerated treatment well    Behavior During Therapy  [facility] for tasks assessed/performed       Past Medical History:  Diagnosis Date  . Above knee amputation status, right   . Anxiety   . Depression   . Diabetes mellitus, type II ([place])   . Hyperlipemia   . Hypertension   . PTSD (post-traumatic stress disorder)   . Shoulder pain     Past Surgical History:  Procedure Laterality Date  . HERNIA REPAIR    .  LEG AMPUTATION Right   . parenatal cyst      There were no vitals filed for this visit.  Subjective Assessment - 04/10/19 2131    Currently in Pain?  No/denies              S:  I like to say concern instead of worry.. O:  Group focus today was on realizing our circle of control in life in relation to worrying.  Group opened with members discussing why we worry and types of healthy and unhealthy worrying.  Healthy worrying is normal, can be caused by day to day issues, and is healthy when you identify if it something you can control and take action on the issue.  Unhealthy worrying occurs when it is centered around something outside of your control or if it is within your control and you do not take action on the item.  Patient was able to identify examples of items that would fall within circle of control and outside of their circle of control.  Group then delved deeper into those items that were out of their control, and how they could reframe their thoughts, actions, etc to be in better control of the situation.  Group spent time exploring the following beliefs:  I cant control anyone else, but I can control my thoughts, words, choices, actions  reactions, future.  I cant control automatic thoughts, but I can control if they stay by recognizing them, disagreeing with them, disproving them, letting them go, and thinking positively.  I can't control my past mistakes but I can control my future by admitting mistakes, apologizing, forgiving others, trying again, and starting over.  Patient then worked to determine what has been a worry for them recently and if it was inside or outside of their circle of control.  Group closed with education on the four agreements to live by:  be impeccable with your word, don't take things personally, don't make assumptions, and always do your best.  Keeping these four agreements in mind can improve your freedom limiting beliefs and worry. A: Mark [name] was withdrawn  was  in group this date, observing versus engaging through much of the session.  If called on, Mark [name] contributed, which is not typical of his behavior in previous sessions.  Mark [name] shared that his biggest take away was the 4 agreements.  P: Continue skilled OT intervention two times per week, next session to focus on assertive training for improved communication skills.              OT Education - 04/10/19 2131    Education Details  educated on coping strategy, circle of control    Person(s) Educated  Patient    Methods  Explanation;Handout    Comprehension  Verbalized understanding       OT Short Term Goals - 04/03/19 1604      OT SHORT TERM GOAL #1   Title  Pt will be educated on strategies to improve psychosocial skills needed to participate fully in all daily, work, and leisure activities    Time  4    Period  Weeks    Status  On-going    Target Date  04/28/19      OT SHORT TERM GOAL #2   Title  Pt will apply psychosocial skills and coping mechanisms to daily activities in order to function independently and reintegrate into community    Time  4    Period  Weeks    Status  On-going      OT SHORT TERM GOAL #3   Title  Pt will recall and/or apply 1-3 sleep hygiene strategies to improve BADL functioning prior to reintegrating into community    Time  4    Period  Weeks    Status  On-going      OT SHORT TERM GOAL #4   Title  Pt will engage in goal setting to improve functional BADL/IADL routine upon reintegrating into community    Time  4    Period  Weeks    Status  On-going      OT SHORT TERM GOAL #5   Title  Pt will create and/or implement functional routine to improve successful engagement in daily tasks prior to reintegrating into community    Time  4    Period  Weeks    Status  On-going               Plan - 04/10/19 2132    Body Structure / Function / Physical Skills  ADL    Cognitive Skills  Emotional;Energy/Drive;Thought    Psychosocial Skills   Coping Strategies;Environmental  Adaptations;Habits;Interpersonal Interaction;Routines and Behaviors       Patient will benefit from skilled therapeutic intervention in order to improve the following deficits and impairments:   Body Structure / Function / Physical Skills: ADL Cognitive  Skills: Emotional, Energy/Drive, Thought Psychosocial Skills: Coping Strategies, Environmental  Adaptations, Habits, Interpersonal Interaction, Routines and Behaviors   Visit Diagnosis: MDD (major depressive disorder), recurrent severe, without psychosis (HCC)  Generalized anxiety disorder  Difficulty coping  Executive function deficit    Problem List Patient Active Problem List   Diagnosis Date Noted  . PTSD (post-traumatic stress disorder) 04/15/2017  . Major depressive disorder, recurrent episode, moderate (HCC) 04/30/2016  . Generalized anxiety disorder 04/30/2016    Shirlean Mylar, MHA, OTR/L [phone]  04/10/2019, 9:33 PM  [facility] PARTIAL HOSPITALIZATION PROGRAM [address] [place], [place], [address] Phone: [phone]   Fax:  [phone]  Name: Mark [name] MRN: [id number] Date of Birth: 1960/12/08

## 2019-04-13 ENCOUNTER — Ambulatory Visit (HOSPITAL_COMMUNITY): Payer: No Typology Code available for payment source | Admitting: Licensed Clinical Social Worker

## 2019-04-13 ENCOUNTER — Other Ambulatory Visit: Payer: Self-pay

## 2019-04-13 ENCOUNTER — Other Ambulatory Visit (HOSPITAL_COMMUNITY): Payer: No Typology Code available for payment source | Admitting: Licensed Clinical Social Worker

## 2019-04-13 ENCOUNTER — Encounter (HOSPITAL_COMMUNITY): Payer: Self-pay | Admitting: Licensed Clinical Social Worker

## 2019-04-13 DIAGNOSIS — F332 Major depressive disorder, recurrent severe without psychotic features: Secondary | ICD-10-CM | POA: Diagnosis not present

## 2019-04-13 DIAGNOSIS — F331 Major depressive disorder, recurrent, moderate: Secondary | ICD-10-CM

## 2019-04-13 DIAGNOSIS — F411 Generalized anxiety disorder: Secondary | ICD-10-CM

## 2019-04-13 DIAGNOSIS — F4310 Post-traumatic stress disorder, unspecified: Secondary | ICD-10-CM

## 2019-04-13 NOTE — Psych (Signed)
Virtual Visit via Video Note  I connected with Mark [name] on 04/02/19 at  9:00 AM EST by a video enabled telemedicine application and verified that I am speaking with the correct person using two identifiers.   I discussed the limitations of evaluation and management by telemedicine and the availability of in person appointments. The patient expressed understanding and agreed to proceed.   I discussed the assessment and treatment plan with the patient. The patient was provided an opportunity to ask questions and all were answered. The patient agreed with the plan and demonstrated an understanding of the instructions.   The patient was advised to call back or seek an in-person evaluation if the symptoms worsen or if the condition fails to improve as anticipated.  Pt was provided 240 minutes of non-face-to-face time during this encounter.   [name], LCSW    [facility] PHP THERAPIST PROGRESS NOTE  Mark [name] [id number]  Session Time: 9:00 - 10:00  Participation Level: Active  Behavioral Response: CasualAlertDepressed  Type of Therapy: Group Therapy  Treatment Goals addressed: Coping  Interventions: CBT, DBT, Supportive and Reframing  Summary: Clinician led check-in regarding current stressors and situation, and review of patient completed daily inventory. Clinician utilized active listening and empathetic response and validated patient emotions. Clinician facilitated processing group on pertinent issues  Therapist Response: Mark [name] is a 59 y.o. male who presents with depression and anxiety symptoms.  Patient arrived within time allowed and reports that he isfeeling "not great, not bad."Patient rates his mood at a 5 on a scale of 1-10 with 10 being great. Pt reports he napped, talked to his wife, tidied, and did [employer]. Pt states he did eat, but had sandwiched because heating food up seemed "too hard." Pt states feeling high anxiety in the evening and  not knowing any specific trigger. Pt struggles with self-judgment. Pt engaged in discussion.        Session Time: 10:00-11:00  Participation Level:Active  Behavioral Response:CasualAlertDepressed  Type of Therapy:Group Therapy  Treatment Goals addressed: Coping  Interventions:CBT, DBT, Solution Focused, Supportive and Reframing  Summary:Cln introduced CBT practice of cognitive restructuring. Group reviewed how to identify the thought, feeling, and behavior in a situation and how to replace the irrational thought with a more neutral/positive thought. Cln utilized generic examples to walk through with group to increase pt understanding and group members provided their own examples and worked to determine the thought, feeling, and behavior at play.   Therapist Response: Pt reports understanding of cognitive restructuring however reports difficulty with getting it to "stick."       Session Time: 11:00- 12:00  Participation Level:Active  Behavioral Response:CasualAlertDepressed  Type of Therapy:Group Therapy  Treatment Goals addressed: Coping  Interventions:CBT, DBT, Solution Focused, Supportive and Reframing  Summary:Cln introduced DBT distress tolerance skills. Cln educated on what these skills are, their purpose, and how they can be used for coping. Cln discussed common struggles with these skills and how to problem solve them. Cln introduced STOP skill and group shared ways they can use the skill in their every day life.   Therapist Response: Pt engaged in discussion and reports understanding of distress tolerance skills and STOP. Pt reports he is most likely to use STOP when dealing with anger.        Session Time: 12:00 -1:00  Participation Level:Active  Behavioral Response:CasualAlertDepressed  Type of Therapy:Group therapy  Treatment Goals addressed: Coping  Interventions:CBT; Solution focused; Supportive;  Reframing  Summary:12:00 - 12:50:Cln continued topic  of distress tolerance skills. Cln introduced TIPP skills. Group discussed each skill and how they could apply it into their every day life and which skills they are most likely to utilize.  12:50 -1:00 Clinician led check-out. Clinician assessed for immediate needs, medication compliance and efficacy, and safety concerns  Therapist Response:12:00 - 12:50:Pt engaged in discussion and reports understanding of TIPP skills. Pt identifies paced breathing as the skill most likely to use.  At check-out, patient [facility] at [facility] a scale of 1-10 with 10 being great. 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 Patient demonstrates some progress as evidenced by improving sleep. Patient denies SI/HI/self-harm thoughts at the end of group.      Suicidal/Homicidal: Nowithout intent/plan   Plan: Pt will continue in PHP while working to increase stabilization post-hospital discharge, decrease depression and anxiety symptoms, and increase ability to manage symptoms in a healthy manner.   Diagnosis: MDD (major depressive disorder), recurrent severe, without psychosis ([place]) [F33.2]    1. MDD (major depressive disorder), recurrent severe, without psychosis ([place])       Lorin Glass, LCSW 04/13/2019

## 2019-04-13 NOTE — Progress Notes (Addendum)
Virtual Visit via Video Note  I connected with Mark [name] on 04/13/19 at  5:00 PM EDT by a video enabled telemedicine application and verified that I am speaking with the correct person using two identifiers.   I discussed the limitations of evaluation and management by telemedicine and the availability of in person appointments. The patient expressed understanding and agreed to proceed.  LOCATION: Patient: home Provider: home  History of Present Illness: [facility], [facility] anxiety[facility]    Observations/Objective:Patient participated in a discussion on having a sense of self-worth, valuing self, and having a sense of feeling worthy. Patient was encouraged to acknowledge the inner critic and practice positive affirmations.     Assessment and Plan: Counselor will continue to meet with patient to address treatment plan goals. Patient will continue to follow recommendations of providers and implement skills learned in session.   Follow Up Instructions: I discussed the assessment and treatment plan with the patient. The patient was provided an opportunity to ask questions and all were answered. The patient agreed with the plan and demonstrated an understanding of the instructions.   The patient was advised to call back or seek an in-person evaluation if the symptoms worsen or if the condition fails to improve as anticipated.  I provided 120 minutes of non-face-to-face time during this encounter.   Elain Wixon S, LCAS

## 2019-04-14 ENCOUNTER — Other Ambulatory Visit (HOSPITAL_COMMUNITY): Payer: No Typology Code available for payment source | Admitting: Specialist

## 2019-04-14 ENCOUNTER — Other Ambulatory Visit (HOSPITAL_COMMUNITY): Payer: No Typology Code available for payment source | Admitting: Licensed Clinical Social Worker

## 2019-04-14 ENCOUNTER — Encounter (HOSPITAL_COMMUNITY): Payer: Self-pay

## 2019-04-14 ENCOUNTER — Encounter (HOSPITAL_COMMUNITY): Payer: Self-pay | Admitting: Family

## 2019-04-14 ENCOUNTER — Other Ambulatory Visit: Payer: Self-pay

## 2019-04-14 DIAGNOSIS — R4589 Other symptoms and signs involving emotional state: Secondary | ICD-10-CM

## 2019-04-14 DIAGNOSIS — F332 Major depressive disorder, recurrent severe without psychotic features: Secondary | ICD-10-CM | POA: Diagnosis not present

## 2019-04-14 DIAGNOSIS — F4310 Post-traumatic stress disorder, unspecified: Secondary | ICD-10-CM

## 2019-04-14 DIAGNOSIS — R41844 Frontal lobe and executive function deficit: Secondary | ICD-10-CM

## 2019-04-14 NOTE — Psych (Signed)
Virtual Visit via Video Note  I connected with Mark [name] on 04/06/19 at  9:00 AM EDT by a video enabled telemedicine application and verified that I am speaking with the correct person using two identifiers.   I discussed the limitations of evaluation and management by telemedicine and the availability of in person appointments. The patient expressed understanding and agreed to proceed.   I discussed the assessment and treatment plan with the patient. The patient was provided an opportunity to ask questions and all were answered. The patient agreed with the plan and demonstrated an understanding of the instructions.   The patient was advised to call back or seek an in-person evaluation if the symptoms worsen or if the condition fails to improve as anticipated.  Pt was provided 240 minutes of non-face-to-face time during this encounter.   [name], LCSW    [facility] PHP THERAPIST PROGRESS NOTE  Mark [name] [id number]  Session Time: 9:00 - 10:00  Participation Level: Active  Behavioral Response: CasualAlertDepressed  Type of Therapy: Group Therapy  Treatment Goals addressed: Coping  Interventions: CBT, DBT, Supportive and Reframing  Summary: Clinician led check-in regarding current stressors and situation, and review of patient completed daily inventory. Clinician utilized active listening and empathetic response and validated patient emotions. Clinician facilitated processing group on pertinent issues  Therapist Response: Mark [name] is a 59 y.o. male who presents with depression and anxiety symptoms.  Patient arrived within time allowed and reports that he isfeeling "melancholy."Patient rates his mood at a 5 on a scale of 1-10 with 10 being great. Pt reports his weekend was "fine" and he didn't do "anything." Pt states he ate sandwiches all weekend due to not wanting to cook and was unable to stick with his positive psychology goals. Pt reports he avoided calls on Sunday  and isolated himself. Pt reports experiencing hopelessness and denies SI. Pt struggles to accept mood fluctuations. Pt engaged in discussion.        Session Time: 10:00-11:00  Participation Level:Active  Behavioral Response:CasualAlertDepressed  Type of Therapy:Group Therapy  Treatment Goals addressed: Coping  Interventions:CBT, DBT, Solution Focused, Supportive and Reframing  Summary:Cln led discussion on adjusting standards based on current abilities. Group discussed what ADL's are difficult for them when they are in acute mental health episodes. Cln encouraged group members to consider 1) what the need is behind the ADL and think of alternative and easier ways to meet the need such as using mouthwash instead of brushing teeth and 2) the frequency of meeting those needs based on their current daily schedule such as not having to shower daily when not leaving the house or sweating. Group shared creative solutions with one another. Cln discussed how adjusting standards when needed is temporary and can increase grace by decreasing judgment.   Therapist Response: Pt engaged in discussion and reports showering, cooking, and completing his to-do list is difficult right now. Pt is able to identfy ways to re-adjust standards temporarily.      Session Time: 11:00- 12:00  Participation Level:Active  Behavioral Response:CasualAlertDepressed  Type of Therapy:Group Therapy  Treatment Goals addressed: Coping  Interventions:CBT, DBT, Solution Focused, Supportive and Reframing  Summary:Cln continued topic of distress tolerance skills and led review of STOP and TIP skills. Group members discussed experiences with practicing the skills at home. Cln introduced distraction skills and how they can be utilized to separate feelings and thoughts as a way to increase emotional regulation.   Therapist Response: Pt reports understanding of distraction and how  it can  benefit [name]. Pt reports he has practiced paced breathing with minimal success.       Session Time: 12:00 -1:00  Participation Level:Active  Behavioral Response:CasualAlertDepressed  Type of Therapy:Group therapy  Treatment Goals addressed: Coping  Interventions:CBT; Solution focused; Supportive; Reframing  Summary:12:00 - 12:50:Cln continued DBT distraction skills and introduced ACCEPTS skills. Group reviewed A-C-C-E skills and brainstormed ways that they could practice this skill and incorporate it into their every day life.  12:50 -1:00 Clinician led check-out. Clinician assessed for immediate needs, medication compliance and efficacy, and safety concerns  Therapist Response:12:00 - 12:50:Pt engaged in discussion and reports undertsanding of topic. Pt identified listening to music, reading scripture, and laughing are ways he can practice these skills.   At check-out, patient rates hismood at [facility] a scale of 1-10 with 10 being great. Patient reports afternoon plans of "doing something." Patient demonstrates some progress as evidenced by trying skills and strategies even though inconsistent. Patient denies SI/HI/self-harm thoughts at the end of group.      Suicidal/Homicidal: Nowithout intent/plan   Plan: Pt will continue in PHP while working to increase stabilization post-hospital discharge, decrease depression and anxiety symptoms, and increase ability to manage symptoms in a healthy manner.   Diagnosis: MDD (major depressive disorder), recurrent severe, without psychosis ([place]) [F33.2]    1. MDD (major depressive disorder), recurrent severe, without psychosis ([place])   2. Generalized anxiety disorder       Lorin Glass, [place] 04/14/2019

## 2019-04-14 NOTE — Therapy (Signed)
Virtual Visit via Video Note  I connected with Mark [name] on 04/14/19 at 1100 by a video enabled telemedicine application and verified that I am speaking with the correct person using two identifiers.   I discussed the limitations of evaluation and management by telemedicine and the availability of in person appointments. The patient expressed understanding and agreed to proceed.      I discussed the assessment and treatment plan with the patient. The patient was provided an opportunity to ask questions and all were answered. The patient agreed with the plan and demonstrated an understanding of the instructions.   The patient was advised to call back or seek an in-person evaluation if the symptoms worsen or if the condition fails to improve as anticipated.  I provided 75 minutes of non-face-to-face time during this encounter.   Mark [name], [place], OTR/L [address] [place], [place], [address] Phone: [phone]   Fax:  [phone]  Occupational Therapy Treatment  Patient Details  Name: Mark [name] MRN: [id number] Date of Birth: [birth year] Referring Provider (OT): Ricky Ala, NP   Encounter Date: 04/14/2019  OT End of Session - 04/14/19 2137    Visit Number  5    Number of Visits  8    Date for OT Re-Evaluation  04/28/19    Authorization Type  [employer] and [employer]    Progress Note Due on Visit  10    OT Start Time  1100    OT Stop Time  1215    OT Time Calculation (min)  75 min    Activity Tolerance  Patient tolerated treatment well    Behavior During Therapy  [facility] for tasks assessed/performed       Past Medical History:  Diagnosis Date  . Above knee amputation status, right   . Anxiety   . Depression   . Diabetes mellitus, type II ([place])   . Hyperlipemia   . Hypertension   . PTSD (post-traumatic stress disorder)   . Shoulder pain     Past Surgical History:  Procedure  Laterality Date  . HERNIA REPAIR    . LEG AMPUTATION Right   . parenatal cyst      There were no vitals filed for this visit.  Subjective Assessment - 04/14/19 2136    Currently in Pain?  No/denies          S:  I am assertive but sometimes as a black male that can be misconstrued as being a threat.  I have to be careful.   O:  Group began with reflection on circle of control from previous session.  Group members shared their reflections and progress with use of this coping tool.  Group focus today was on communication tool of assertiveness.  Group completed an assertive quiz, which identified if they tended to be assertive all of the time, often, occasionally, or never.  Group members reflected on whether their score correlated with their beliefs in their actions.  Group explored connotation of assertiveness and determined it was almost always a positive trait.  Group was educated on benefits of being assertive, including:  self esteem, goal achievement, anxiety reduction, development of mutual respect for others, improved decision making and empowerment, and improved self expression.  Group reflected on roadblocks they experience that prevents them from being assertive, including lack of confidence, fearful of coming across as aggressive, and being too emotional to do so.  Patient was educated  on definition of passive, assertive, and aggressive communication.  Benefits and drawbacks of each communication style, nonverbal communication/body language associated with each communication style.  Tips for communicating assertively were shared with group, including:  use of I statements, clearly stating your needs, non verbal skills, self respect, speaking calmly, planning and rehearsing conversation/email/text response, scheduling time to respond to an unplanned conversation that causes emotional response/trigger, and use of the x/y/z formula.  The x y z formula uses the following formula:   I feel X when  you do Y in situation Z and I would like _________.  (X = emotion, Y = specific behavior , Z = specific situation).  Each member then formulated a xyz statement for a particular situation they are currently encountering and shared with the group.  Group and OT provided feedback on components being present or not in statement.  Group also discussed and were educated on strategies to assertively sayi no; including no as a sentence, assertively refusing requests, assertive ways of saying no, and steps in learning to say no.   A:  Mark [name] reflected that he is assertive and sometimes can be perceived as aggressive, which is not his intent.  He also reflected that being assertive can be fearless.  Mark [name] shared an xyz statement to his daughter that he is angry that you are not listening and that makes him angry.  Therapist encouraged Mark [name] to reexamine his statement to ensure that it clearly has an emotion, specific behavior, and specific situation.  Mark [name] also reflected that he doesn't always say no because he likes to feel needed.   P:  Continue skilled intervention to improve community reintegration skills.  Next session to focus on sleep hygiene skills. Mark [name], MHA, OTR/L [phone]                    OT Education - 04/14/19 2136    Education Details  educated on assertive communication skills    Person(s) Educated  Patient    Methods  Explanation;Handout    Comprehension  Verbalized understanding       OT Short Term Goals - 04/03/19 1604      OT SHORT TERM GOAL #1   Title  Pt will be educated on strategies to improve psychosocial skills needed to participate fully in all daily, work, and leisure activities    Time  4    Period  Weeks    Status  On-going    Target Date  04/28/19      OT SHORT TERM GOAL #2   Title  Pt will apply psychosocial skills and coping mechanisms to daily activities in order to function independently and reintegrate into community    Time  4     Period  Weeks    Status  On-going      OT SHORT TERM GOAL #3   Title  Pt will recall and/or apply 1-3 sleep hygiene strategies to improve BADL functioning prior to reintegrating into community    Time  4    Period  Weeks    Status  On-going      OT SHORT TERM GOAL #4   Title  Pt will engage in goal setting to improve functional BADL/IADL routine upon reintegrating into community    Time  4    Period  Weeks    Status  On-going      OT SHORT TERM GOAL #5   Title  Pt will create and/or implement functional routine  to improve successful engagement in daily tasks prior to reintegrating into community    Time  4    Period  Weeks    Status  On-going               Plan - 04/14/19 2138    Body Structure / Function / Physical Skills  ADL    Cognitive Skills  Emotional;Energy/Drive;Thought    Psychosocial Skills  Coping Strategies;Environmental  Adaptations;Habits;Interpersonal Interaction;Routines and Behaviors       Patient will benefit from skilled therapeutic intervention in order to improve the following deficits and impairments:   Body Structure / Function / Physical Skills: ADL Cognitive Skills: Emotional, Energy/Drive, Thought Psychosocial Skills: Coping Strategies, Environmental  Adaptations, Habits, Interpersonal Interaction, Routines and Behaviors   Visit Diagnosis: Difficulty coping  MDD (major depressive disorder), recurrent severe, without psychosis (HCC)  PTSD (post-traumatic stress disorder)  Executive function deficit    Problem List Patient Active Problem List   Diagnosis Date Noted  . PTSD (post-traumatic stress disorder) 04/15/2017  . Major depressive disorder, recurrent episode, moderate (HCC) 04/30/2016  . Generalized anxiety disorder 04/30/2016    Mark [name], MHA, OTR/L [phone]  04/14/2019, 9:38 PM  [facility] PARTIAL HOSPITALIZATION PROGRAM [address] [place], [place], [address] Phone:  [phone]   Fax:  [phone]  Name: Mark [name] MRN: [id number] Date of Birth: Oct 17, 1960

## 2019-04-14 NOTE — Progress Notes (Signed)
Virtual Visit via Telephone Note  I connected with Mark [name] on 04/14/19 at  9:00 AM EDT by telephone and verified that I am speaking with the correct person using two identifiers.   I discussed the limitations, risks, security and privacy concerns of performing an evaluation and management service by telephone and the availability of in person appointments. I also discussed with the patient that there may be a patient responsible charge related to this service. The patient expressed understanding and agreed to proceed.   I discussed the assessment and treatment plan with the patient. The patient was provided an opportunity to ask questions and all were answered. The patient agreed with the plan and demonstrated an understanding of the instructions.   The patient was advised to call back or seek an in-person evaluation if the symptoms worsen or if the condition fails to improve as anticipated.  I provided 15 minutes of non-face-to-face time during this encounter.   Derrill Center, NP   [facility] MD/PA/NP OP Progress Note  04/14/2019 11:26 AM Mark [name]  MRN:  [id number]  Chief Complaint:  Mark [name] reported " I am not in a good mood today."  Evaluation: Mark [name] was seen and evaluated via WebEx.  He is denying suicidal or homicidal ideations during this assessment however reports mood irritability and worsening depression today.  Cites " my daughter just made me mad at this morning because she is not taking care of her business."  Patient endorsed low motivation, rating his depression 5 out of 10 with 10 being the worst during this assessment.  Stated his anxiety  7/10. Staff reported concerns with daily hygiene as reported patient has not been bathing.  Reports he is working on his sleep continues to report his restless sleep however states he is aware that he needs to be resting in the bed in order to utilize his CPAP.  Patient reports he has a follow-up appointment today with his primary care provider.   Support, encouragement and reassurance was provided.  Visit Diagnosis: No diagnosis found.  Past Psychiatric History:   Past Medical History:  Past Medical History:  Diagnosis Date  . Above knee amputation status, right   . Anxiety   . Depression   . Diabetes mellitus, type II ([place])   . Hyperlipemia   . Hypertension   . PTSD (post-traumatic stress disorder)   . Shoulder pain     Past Surgical History:  Procedure Laterality Date  . HERNIA REPAIR    . LEG AMPUTATION Right   . parenatal cyst      Family Psychiatric History:   Family History:  Family History  Problem Relation Age of Onset  . Aneurysm Mother   . Leukemia Father   . Dementia Maternal Aunt   . Seizures Paternal Uncle     Social History:  Social History   Socioeconomic History  . Marital status: Married    Spouse name: Not on file  . Number of children: Not on file  . Years of education: Not on file  . Highest education level: Not on file  Occupational History  . Not on file  Tobacco Use  . Smoking status: Current Every Day Smoker    Packs/day: 1.50    Years: 35.00    Pack years: 52.50    Types: Cigarettes  . Smokeless tobacco: Never Used  . Tobacco comment: Had quit for 3 years then restarted and back up to 1 pk a day.  Substance and Sexual Activity  .  Alcohol use: Yes    Alcohol/week: 4.0 standard drinks    Types: 2 Cans of beer, 2 Shots of liquor per week    Comment: Reports light drinking weekly.  . Drug use: Yes    Frequency: 14.0 times per week    Types: Marijuana    Comment: Uses 1-2 times a day per report for his anxiety  . Sexual activity: Not Currently  Other Topics Concern  . Not on file  Social History Narrative  . Not on file   Social Determinants of Health   Financial Resource Strain: Low Risk   . Difficulty of Paying Living Expenses: Not very hard  Food Insecurity: No Food Insecurity  . Worried About Programme researcher, broadcasting/film/video in the Last Year: Never true  . Ran Out of Food  in the Last Year: Never true  Transportation Needs: No Transportation Needs  . Lack of Transportation (Medical): No  . Lack of Transportation (Non-Medical): No  Physical Activity: Insufficiently Active  . Days of Exercise per Week: 2 days  . Minutes of Exercise per Session: 20 min  Stress: Stress Concern Present  . Feeling of Stress : Very much  Social Connections: Slightly Isolated  . Frequency of Communication with Friends and Family: More than three times a week  . Frequency of Social Gatherings with Friends and Family: Never  . Attends Religious Services: More than 4 times per year  . Active Member of Clubs or Organizations: Yes  . Attends Banker Meetings: More than 4 times per year  . Marital Status: Separated    Allergies:  Allergies  Allergen Reactions  . Ozempic (0.25 Or 0.5 Mg-Dose) [Semaglutide(0.25 Or 0.5mg -Dos)] Other (See Comments)    Caused Pancreatitis  . Lyrica [Pregabalin] Rash    Metabolic Disorder Labs: No results found for: HGBA1C, MPG No results found for: PROLACTIN No results found for: CHOL, TRIG, HDL, CHOLHDL, VLDL, LDLCALC No results found for: TSH  Therapeutic Level Labs: No results found for: LITHIUM No results found for: VALPROATE No components found for:  CBMZ  Current Medications: Current Outpatient Medications  Medication Sig Dispense Refill  . atorvastatin (LIPITOR) 80 MG tablet Take 80 mg by mouth daily.    Marland Kitchen buPROPion (WELLBUTRIN XL) 300 MG 24 hr tablet Take 1 tablet (300 mg total) by mouth daily. (Patient not taking: Reported on 04/07/2019) 90 tablet 0  . busPIRone (BUSPAR) 10 MG tablet Take 10 mg by mouth 2 (two) times daily.    . carvedilol (COREG) 12.5 MG tablet Take 12.5 mg by mouth 2 (two) times daily with a meal.    . cyclobenzaprine (FLEXERIL) 10 MG tablet Take 10 mg by mouth 2 (two) times daily as needed for muscle spasms.    Marland Kitchen diltiazem (TIAZAC) 360 MG 24 hr capsule Take 360 mg by mouth daily.    Marland Kitchen escitalopram  (LEXAPRO) 20 MG tablet Take 20 mg by mouth daily.    Marland Kitchen gabapentin (NEURONTIN) 300 MG capsule Take 1 capsule (300 mg total) by mouth 2 (two) times daily. (Patient not taking: Reported on 04/01/2019) 180 capsule 0  . hydrOXYzine (ATARAX/VISTARIL) 25 MG tablet Take 25 mg by mouth 2 (two) times daily as needed.    . insulin aspart protamine- aspart (NOVOLOG MIX 70/30) (70-30) 100 UNIT/ML injection Inject into the skin.    . Insulin Glargine (LANTUS [place]) Inject into the skin.    Marland Kitchen lamoTRIgine (LAMICTAL) 100 MG tablet Take 1 tablet (100 mg total) by mouth daily. (Patient not  taking: Reported on 04/01/2019) 90 tablet 0  . lisinopril (PRINIVIL,ZESTRIL) 5 MG tablet Take 5 mg by mouth daily.    . metFORMIN (GLUCOPHAGE) 1000 MG tablet Take 1,000 mg by mouth 2 (two) times daily with a meal.    . methocarbamol (ROBAXIN) 500 MG tablet Take 500 mg by mouth 4 (four) times daily.    . modafinil (PROVIGIL) 100 MG tablet Take 100 mg by mouth daily.    Marland Kitchen oxyCODONE-acetaminophen (PERCOCET) 5-325 MG tablet Take by mouth every 4 (four) hours as needed for severe pain.    . prazosin (MINIPRESS) 2 MG capsule Take 1 capsule (2 mg total) by mouth at bedtime. 90 capsule 0  . sildenafil (VIAGRA) 100 MG tablet Take 100 mg by mouth daily as needed for erectile dysfunction.    . traZODone (DESYREL) 50 MG tablet Take 25 mg by mouth at bedtime.      No current facility-administered medications for this visit.     Musculoskeletal:  Psychiatric Specialty Exam: Review of Systems  There were no vitals taken for this visit.There is no height or weight on file to calculate BMI.  General Appearance: Casual  Eye Contact:  Good  Speech:  Clear and Coherent  Volume:  Normal  Mood:  Anxious and Depressed  Affect:  Congruent  Thought Process:  Coherent  Orientation:  Full (Time, Place, and Person)  Thought Content: WDL, Logical and Rumination   Suicidal Thoughts:  No  Homicidal Thoughts:  No  Memory:  Immediate;   Fair Recent;    Fair  Judgement:  Fair  Insight:  Fair  Psychomotor Activity:  Normal  Concentration:  Concentration: Fair  Recall:  [employer] of Knowledge: Fair  Language: Fair  Akathisia:   Handed:  Right  AIMS (if indicated):  Assets:  Communication Skills Desire for Improvement Resilience Social Support  ADL's:  Intact  Cognition: WNL  Sleep:  Fair   Screenings: GAD-7     Counselor from 06/03/2017 in BEHAVIORAL HEALTH OUTPATIENT THERAPY [place]  Total GAD-7 Score  12    PHQ2-9     Counselor from 04/01/2019 in BEHAVIORAL HEALTH PARTIAL HOSPITALIZATION PROGRAM Counselor from 06/12/2018 in BEHAVIORAL HEALTH PARTIAL HOSPITALIZATION PROGRAM Counselor from 06/05/2018 in BEHAVIORAL HEALTH PARTIAL HOSPITALIZATION PROGRAM Counselor from 05/22/2018 in BEHAVIORAL HEALTH PARTIAL HOSPITALIZATION PROGRAM Counselor from 11/27/2017 in BEHAVIORAL HEALTH OUTPATIENT THERAPY [place]  PHQ-2 Total Score  6  4  3  6  6   PHQ-9 Total Score  17  14  15  24  21        Assessment and Plan:  Continue partial hospitalization programming Continue medications as directed Keep all follow-up appointments with primary care provider and attending psychiatrist/therapist  Treatment plan was reviewed and agreed upon by NP T. and patient Mark [name]'s need for continued group services   Melvyn Neth, NP 04/14/2019, 11:26 AM

## 2019-04-15 ENCOUNTER — Other Ambulatory Visit: Payer: Self-pay

## 2019-04-15 ENCOUNTER — Other Ambulatory Visit (HOSPITAL_COMMUNITY): Payer: No Typology Code available for payment source | Admitting: Licensed Clinical Social Worker

## 2019-04-15 ENCOUNTER — Ambulatory Visit (HOSPITAL_COMMUNITY): Payer: No Typology Code available for payment source | Admitting: Licensed Clinical Social Worker

## 2019-04-15 DIAGNOSIS — F411 Generalized anxiety disorder: Secondary | ICD-10-CM

## 2019-04-15 DIAGNOSIS — F4310 Post-traumatic stress disorder, unspecified: Secondary | ICD-10-CM

## 2019-04-15 DIAGNOSIS — F332 Major depressive disorder, recurrent severe without psychotic features: Secondary | ICD-10-CM | POA: Diagnosis not present

## 2019-04-15 NOTE — Progress Notes (Signed)
Spoke with patient via Webex video call, used 2 identifiers to correctly identify patient. Groups are going good, states "its a lot to digest." Has anxiety about a lot of things such as needing to do a lot. He has made lists to help him stay organized. Feels he needs to de-clutter. Trying to stay motivated. On scale 1-10 as 10 being worst he rates depression at 5 and anxiety 8. Denies SI/HI or AV hallucinations. No side effects from medications. No issues or complaints.

## 2019-04-16 ENCOUNTER — Other Ambulatory Visit: Payer: Self-pay

## 2019-04-16 ENCOUNTER — Other Ambulatory Visit (HOSPITAL_COMMUNITY): Payer: No Typology Code available for payment source

## 2019-04-16 ENCOUNTER — Other Ambulatory Visit (HOSPITAL_COMMUNITY): Payer: No Typology Code available for payment source | Admitting: Licensed Clinical Social Worker

## 2019-04-16 DIAGNOSIS — F4310 Post-traumatic stress disorder, unspecified: Secondary | ICD-10-CM

## 2019-04-16 DIAGNOSIS — F411 Generalized anxiety disorder: Secondary | ICD-10-CM

## 2019-04-16 DIAGNOSIS — F332 Major depressive disorder, recurrent severe without psychotic features: Secondary | ICD-10-CM

## 2019-04-16 NOTE — Addendum Note (Signed)
Addended by: Vernona Rieger on: 04/16/2019 09:19 AM   Modules accepted: Level of Service

## 2019-04-16 NOTE — Progress Notes (Signed)
GROUP NOTE - 04/15/2019 Spiritual care group  11:00 - 12:00 ? Group met via web-ex due to COVID-19 precautions.  Group facilitated by Simone Curia, MDiv, BCC  ? ? Group focused on topic of strength. ?Group members reflected on what thoughts and feelings emerge when they hear this topic. ?They then engaged in facilitated dialog around how strength is present in their lives. This dialog focused on representing what strength had been to them in their lives (images and patterns given) and what they saw as helpful in their life now (what they needed / wanted). ? ? Activity drew on narrative framework   Mark [name] was present throughout group.  Engaged in group discussion - defining strength as grace for self, compassion for others.  Spoke of his journey with coping with trauma and found it helpful to find groups of men who could relate with his experiences.

## 2019-04-17 ENCOUNTER — Other Ambulatory Visit (HOSPITAL_COMMUNITY): Payer: No Typology Code available for payment source | Admitting: Specialist

## 2019-04-17 ENCOUNTER — Other Ambulatory Visit (HOSPITAL_COMMUNITY): Payer: No Typology Code available for payment source | Admitting: Licensed Clinical Social Worker

## 2019-04-17 ENCOUNTER — Other Ambulatory Visit: Payer: Self-pay

## 2019-04-17 ENCOUNTER — Encounter (HOSPITAL_COMMUNITY): Payer: Self-pay

## 2019-04-17 DIAGNOSIS — R4589 Other symptoms and signs involving emotional state: Secondary | ICD-10-CM

## 2019-04-17 DIAGNOSIS — F411 Generalized anxiety disorder: Secondary | ICD-10-CM

## 2019-04-17 DIAGNOSIS — F4310 Post-traumatic stress disorder, unspecified: Secondary | ICD-10-CM

## 2019-04-17 DIAGNOSIS — F332 Major depressive disorder, recurrent severe without psychotic features: Secondary | ICD-10-CM | POA: Diagnosis not present

## 2019-04-17 DIAGNOSIS — F331 Major depressive disorder, recurrent, moderate: Secondary | ICD-10-CM

## 2019-04-17 NOTE — Therapy (Signed)
Virtual Visit via Video Note  I connected with Mark [name] on 04/17/19 at  11:00 AM EDT by a video enabled telemedicine application and verified that I am speaking with the correct person using two identifiers.   I discussed the limitations of evaluation and management by telemedicine and the availability of in person appointments. The patient expressed understanding and agreed to proceed.     I discussed the assessment and treatment plan with the patient. The patient was provided an opportunity to ask questions and all were answered. The patient agreed with the plan and demonstrated an understanding of the instructions.   The patient was advised to call back or seek an in-person evaluation if the symptoms worsen or if the condition fails to improve as anticipated.  I provided 50 minutes of non-face-to-face time during this encounter.     [facility] PARTIAL HOSPITALIZATION PROGRAM [address] [place], [place], [address] Phone: [phone]   Fax:  [phone]  Occupational Therapy Treatment  Patient Details  Name: Mark [name] MRN: [id number] Date of Birth: Jun 22, 1960 Referring Provider (OT): Hillery Jacks, NP   Encounter Date: 04/17/2019  OT End of Session - 04/17/19 1443    Visit Number  6    Number of Visits  8    Date for OT Re-Evaluation  04/28/19    Authorization Type  [facility] and [employer]    Authorization Time Period  [employer] authorized 8 visits    Authorization - Visit Number  6    Authorization - Number of Visits  8    Progress Note Due on Visit  10    OT Start Time  1105    OT Stop Time  1155    OT Time Calculation (min)  50 min    Activity Tolerance  Patient tolerated treatment well    Behavior During Therapy  [facility] for tasks assessed/performed       Past Medical History:  Diagnosis Date  . Above knee amputation status, right   . Anxiety   . Depression   . Diabetes mellitus, type II (HCC)   . Hyperlipemia   . Hypertension   . PTSD  (post-traumatic stress disorder)   . Shoulder pain     Past Surgical History:  Procedure Laterality Date  . HERNIA REPAIR    . LEG AMPUTATION Right   . parenatal cyst      There were no vitals filed for this visit.  Subjective Assessment - 04/17/19 1440    Currently in Pain?  No/denies       S:  I sleep 7-9 hours a night. O: patient participated in skilled OT group focusing on improving sleep hygiene.  Patient was educated and discussed benefits of getting enough sleep, detriments of getting too much or too little sleep.  Patient and group discussed strategies for improving sleep including routines, environmental factors, diet, exercises, calming activities.  Patient was educated on use of sleep diary to identify current sleep patterns and strategies to improve sleep routine.   A:  Mark [name] was engaged throughout group.  He reflected that he had a sleep routine, however he was not accountable in following through with it.  He committed to eating a healthy snack before bed and writing down his worries so they do not sabotage his sleep. P:  continue skilled OT group 2 times per week for 3 weeks.  Next session to focus on financial management. Shirlean Mylar, MHA, OTR/L [phone]  OT Education - 04/17/19 1440    Education Details  sleep hygiene    Person(s) Educated  Patient    Methods  Explanation;Handout    Comprehension  Verbalized understanding       OT Short Term Goals - 04/03/19 1604      OT SHORT TERM GOAL #1   Title  Pt will be educated on strategies to improve psychosocial skills needed to participate fully in all daily, work, and leisure activities    Time  4    Period  Weeks    Status  On-going    Target Date  04/28/19      OT SHORT TERM GOAL #2   Title  Pt will apply psychosocial skills and coping mechanisms to daily activities in order to function independently and reintegrate into community    Time  4    Period  Weeks     Status  On-going      OT SHORT TERM GOAL #3   Title  Pt will recall and/or apply 1-3 sleep hygiene strategies to improve BADL functioning prior to reintegrating into community    Time  4    Period  Weeks    Status  On-going      OT SHORT TERM GOAL #4   Title  Pt will engage in goal setting to improve functional BADL/IADL routine upon reintegrating into community    Time  4    Period  Weeks    Status  On-going      OT SHORT TERM GOAL #5   Title  Pt will create and/or implement functional routine to improve successful engagement in daily tasks prior to reintegrating into community    Time  4    Period  Weeks    Status  On-going               Plan - 04/17/19 1441    Body Structure / Function / Physical Skills  ADL    Cognitive Skills  Emotional;Energy/Drive;Thought    Psychosocial Skills  Coping Strategies;Environmental  Adaptations;Habits;Interpersonal Interaction;Routines and Behaviors       Patient will benefit from skilled therapeutic intervention in order to improve the following deficits and impairments:   Body Structure / Function / Physical Skills: ADL Cognitive Skills: Emotional, Energy/Drive, Thought Psychosocial Skills: Coping Strategies, Environmental  Adaptations, Habits, Interpersonal Interaction, Routines and Behaviors   Visit Diagnosis: Difficulty coping    Problem List Patient Active Problem List   Diagnosis Date Noted  . PTSD (post-traumatic stress disorder) 04/15/2017  . Major depressive disorder, recurrent episode, moderate ([place]) 04/30/2016  . Generalized anxiety disorder 04/30/2016    Arbutus Ped 04/17/2019, 2:44 PM  [facility] PARTIAL HOSPITALIZATION PROGRAM [place], [place], [address] Phone: [phone]   Fax:  [phone]  Name: Mark [name] MRN: [id number] Date of Birth: 1960-10-31

## 2019-04-20 ENCOUNTER — Other Ambulatory Visit (HOSPITAL_COMMUNITY): Payer: No Typology Code available for payment source | Admitting: Licensed Clinical Social Worker

## 2019-04-20 ENCOUNTER — Ambulatory Visit (HOSPITAL_COMMUNITY): Payer: No Typology Code available for payment source | Admitting: Licensed Clinical Social Worker

## 2019-04-20 ENCOUNTER — Encounter (HOSPITAL_COMMUNITY): Payer: Self-pay | Admitting: Licensed Clinical Social Worker

## 2019-04-20 ENCOUNTER — Other Ambulatory Visit: Payer: Self-pay

## 2019-04-20 ENCOUNTER — Other Ambulatory Visit (HOSPITAL_COMMUNITY): Payer: No Typology Code available for payment source | Admitting: Specialist

## 2019-04-20 DIAGNOSIS — F4310 Post-traumatic stress disorder, unspecified: Secondary | ICD-10-CM

## 2019-04-20 DIAGNOSIS — F411 Generalized anxiety disorder: Secondary | ICD-10-CM

## 2019-04-20 DIAGNOSIS — F332 Major depressive disorder, recurrent severe without psychotic features: Secondary | ICD-10-CM | POA: Diagnosis not present

## 2019-04-20 DIAGNOSIS — F331 Major depressive disorder, recurrent, moderate: Secondary | ICD-10-CM

## 2019-04-20 NOTE — Therapy (Signed)
Patient did not attend group this date.   Shirlean Mylar, MHA, OTR/L [phone]

## 2019-04-20 NOTE — Progress Notes (Signed)
Virtual Visit via Video Note  I connected with Mark [name] on 04/20/19 at  5:00 PM EDT by a video enabled telemedicine application and verified that I am speaking with the correct person using two identifiers.   I discussed the limitations of evaluation and management by telemedicine and the availability of in person appointments. The patient expressed understanding and agreed to proceed.  History of Present Illness: [facility], depression, anxiety.    Observations/Objective:Pt participated in a discussion on changing emotional responses, regulating emotions effectively. Encouraged patient to use his coping skills to more effectively regulate his emotions.   Assessment and Plan: Counselor will continue to meet with patient to address treatment plan goals. Patient will continue to follow recommendations of providers and implement skills learned in session.   Follow Up Instructions: I discussed the assessment and treatment plan with the patient. The patient was provided an opportunity to ask questions and all were answered. The patient agreed with the plan and demonstrated an understanding of the instructions.   The patient was advised to call back or seek an in-person evaluation if the symptoms worsen or if the condition fails to improve as anticipated.  I provided 90 minutes of non-face-to-face time during this encounter.   Shoua Ulloa S, LCAS

## 2019-04-21 ENCOUNTER — Other Ambulatory Visit (HOSPITAL_COMMUNITY): Payer: No Typology Code available for payment source | Admitting: Licensed Clinical Social Worker

## 2019-04-21 ENCOUNTER — Other Ambulatory Visit: Payer: Self-pay

## 2019-04-21 ENCOUNTER — Ambulatory Visit (HOSPITAL_COMMUNITY): Payer: No Typology Code available for payment source

## 2019-04-21 ENCOUNTER — Encounter (HOSPITAL_COMMUNITY): Payer: Self-pay | Admitting: Family

## 2019-04-21 ENCOUNTER — Other Ambulatory Visit (HOSPITAL_COMMUNITY): Payer: No Typology Code available for payment source

## 2019-04-21 DIAGNOSIS — F411 Generalized anxiety disorder: Secondary | ICD-10-CM

## 2019-04-21 DIAGNOSIS — R4589 Other symptoms and signs involving emotional state: Secondary | ICD-10-CM

## 2019-04-21 DIAGNOSIS — F332 Major depressive disorder, recurrent severe without psychotic features: Secondary | ICD-10-CM | POA: Diagnosis not present

## 2019-04-21 DIAGNOSIS — F4310 Post-traumatic stress disorder, unspecified: Secondary | ICD-10-CM

## 2019-04-21 NOTE — Progress Notes (Signed)
BH MD/PA/NP OP Progress Note  04/21/2019 2:30 PM Scotty Weigelt  MRN:  [id number]  Subjective: "  I am just having a hard time getting motivated."  Evaluation: Patient was seen and evaluated via teleassessment.  He presents pleasant, cooperative however stating worsening depression low motivation and multiple stressors related to family.  States that he and his wife continue to argue which has attributed to most of his most stressors.  Reported mostly other disagreements have surrounded by her current  Financial situation.  Denying suicidal or homicidal ideations however reports continued passive thoughts of death denies plan or intent.  Patient reports struggling with anger and mood irritability.  Discussed following up with anger management program.  Patient was receptive to plan.  States [facility] will [facility] of [place].  Patient to continue partial hospitalization programming and keep follow-up appointments with therapist [name]. Patient to follow-up with MD Afreen for medication management. Support, encouragement and reassurances was provided.   Visit Diagnosis: No diagnosis found.  Past Psychiatric History:   Past Medical History:  Past Medical History:  Diagnosis Date  . Above knee amputation status, right   . Anxiety   . Depression   . Diabetes mellitus, type II (HCC)   . Hyperlipemia   . Hypertension   . PTSD (post-traumatic stress disorder)   . Shoulder pain     Past Surgical History:  Procedure Laterality Date  . HERNIA REPAIR    . LEG AMPUTATION Right   . parenatal cyst      Family Psychiatric History:   Family History:  Family History  Problem Relation Age of Onset  . Aneurysm Mother   . Leukemia Father   . Dementia Maternal Aunt   . Seizures Paternal Uncle     Social History:  Social History   Socioeconomic History  . Marital status: Married    Spouse name: Not on file  . Number of children: Not on file  .  Years of education: Not on file  . Highest education level: Not on file  Occupational History  . Not on file  Tobacco Use  . Smoking status: Current Every Day Smoker    Packs/day: 1.50    Years: 35.00    Pack years: 52.50    Types: Cigarettes  . Smokeless tobacco: Never Used  . Tobacco comment: Had quit for 3 years then restarted and back up to 1 pk a day.  Substance and Sexual Activity  . Alcohol use: Yes    Alcohol/week: 4.0 standard drinks    Types: 2 Cans of beer, 2 Shots of liquor per week    Comment: Reports light drinking weekly.  . Drug use: Yes    Frequency: 14.0 times per week    Types: Marijuana    Comment: Uses 1-2 times a day per report for his anxiety  . Sexual activity: Not Currently  Other Topics Concern  . Not on file  Social History Narrative  . Not on file   Social Determinants of Health   Financial Resource Strain: Low Risk   . Difficulty of Paying Living Expenses: Not very hard  Food Insecurity: No Food Insecurity  . Worried About Programme researcher, broadcasting/film/video in the Last Year: Never true  . Ran Out of Food in the Last Year: Never true  Transportation Needs: No Transportation Needs  . Lack of Transportation (Medical): No  . Lack of Transportation (Non-Medical): No  Physical Activity: Insufficiently Active  .  Days of Exercise per Week: 2 days  . Minutes of Exercise per Session: 20 min  Stress: Stress Concern Present  . Feeling of Stress : Very much  Social Connections: Slightly Isolated  . Frequency of Communication with Friends and Family: More than three times a week  . Frequency of Social Gatherings with Friends and Family: Never  . Attends Religious Services: More than 4 times per year  . Active Member of Clubs or Organizations: Yes  . Attends Banker Meetings: More than 4 times per year  . Marital Status: Separated    Allergies:  Allergies  Allergen Reactions  . Ozempic (0.25 Or 0.5 Mg-Dose) [Semaglutide(0.25 Or 0.5mg -Dos)] Other (See  Comments)    Caused Pancreatitis  . Lyrica [Pregabalin] Rash    Metabolic Disorder Labs: No results found for: HGBA1C, MPG No results found for: PROLACTIN No results found for: CHOL, TRIG, HDL, CHOLHDL, VLDL, LDLCALC No results found for: TSH  Therapeutic Level Labs: No results found for: LITHIUM No results found for: VALPROATE No components found for:  CBMZ  Current Medications: Current Outpatient Medications  Medication Sig Dispense Refill  . atorvastatin (LIPITOR) 80 MG tablet Take 80 mg by mouth daily.    . busPIRone (BUSPAR) 10 MG tablet Take 10 mg by mouth 2 (two) times daily.    . carvedilol (COREG) 12.5 MG tablet Take 12.5 mg by mouth 2 (two) times daily with a meal.    . cyclobenzaprine (FLEXERIL) 10 MG tablet Take 10 mg by mouth 2 (two) times daily as needed for muscle spasms.    Marland Kitchen diltiazem (TIAZAC) 360 MG 24 hr capsule Take 360 mg by mouth daily.    Marland Kitchen escitalopram (LEXAPRO) 20 MG tablet Take 20 mg by mouth daily.    . hydrOXYzine (ATARAX/VISTARIL) 25 MG tablet Take 25 mg by mouth 2 (two) times daily as needed.    . insulin aspart protamine- aspart (NOVOLOG MIX 70/30) (70-30) 100 UNIT/ML injection Inject into the skin.    . Insulin Glargine (LANTUS [place]) Inject into the skin.    Marland Kitchen lisinopril (PRINIVIL,ZESTRIL) 5 MG tablet Take 5 mg by mouth daily.    . metFORMIN (GLUCOPHAGE) 1000 MG tablet Take 1,000 mg by mouth 2 (two) times daily with a meal.    . methocarbamol (ROBAXIN) 500 MG tablet Take 500 mg by mouth 4 (four) times daily.    . modafinil (PROVIGIL) 100 MG tablet Take 100 mg by mouth daily.    Marland Kitchen oxyCODONE-acetaminophen (PERCOCET) 5-325 MG tablet Take by mouth every 4 (four) hours as needed for severe pain.    . prazosin (MINIPRESS) 2 MG capsule Take 1 capsule (2 mg total) by mouth at bedtime. 90 capsule 0  . sildenafil (VIAGRA) 100 MG tablet Take 100 mg by mouth daily as needed for erectile dysfunction.    . traZODone (DESYREL) 50 MG tablet Take 25 mg by mouth at  bedtime.     Marland Kitchen buPROPion (WELLBUTRIN XL) 300 MG 24 hr tablet Take 1 tablet (300 mg total) by mouth daily. (Patient not taking: Reported on 04/07/2019) 90 tablet 0  . gabapentin (NEURONTIN) 300 MG capsule Take 1 capsule (300 mg total) by mouth 2 (two) times daily. (Patient not taking: Reported on 04/01/2019) 180 capsule 0  . lamoTRIgine (LAMICTAL) 100 MG tablet Take 1 tablet (100 mg total) by mouth daily. (Patient not taking: Reported on 04/01/2019) 90 tablet 0   No current facility-administered medications for this visit.     Musculoskeletal:   Psychiatric  Specialty Exam: Review of Systems  There were no vitals taken for this visit.There is no height or weight on file to calculate BMI.  General Appearance: Casual  Eye Contact:  Fair  Speech:  Clear and Coherent  Volume:  Normal  Mood:  Anxious and Depressed  Affect:  Congruent  Thought Process:  Coherent  Orientation:  Full (Time, Place, and Person)  Thought Content: Hallucinations: None and Rumination   Suicidal Thoughts:  No passive thought of death, Denied plan of intent   Homicidal Thoughts:  No  Memory:  Immediate;   Fair Recent;   Fair  Judgement:  Fair  Insight:  Fair  Psychomotor Activity:  Normal  Concentration:  Concentration: Fair  Recall:  [employer] of Knowledge: Fair  Language: Fair  Akathisia:  No  Handed:  Right  AIMS (if indicated):   Assets:  Communication Skills Desire for Improvement Resilience Social Support  ADL's:  Intact  Cognition: WNL  Sleep:  Fair   Screenings: GAD-7     Counselor from 06/03/2017 in [place]  Total GAD-7 Score  12    PHQ2-9     Counselor from 04/01/2019 in [place] Counselor from 06/12/2018 in [place] Counselor from 06/05/2018 in [place] Counselor from 05/22/2018 in [place]  Counselor from 11/27/2017 in [place]  PHQ-2 Total Score  6  4  3  6  6   PHQ-9 Total Score  17  14  15  24  21        Assessment and Plan:  Continue partial hospitalization programming Continue medications as directed  Treatment plan was reviewed and agreed upon by NPT Nikyla Navedo and patient [place]'s need for continued group services   Derrill [place], NP 04/21/2019, 2:30 PM

## 2019-04-21 NOTE — Progress Notes (Signed)
Spoke with patient via Webex video call, used 2 identifiers to correctly identify patient. States groups are going great, feels it is his support system and keeps him grounded. He states that the people in group are the ones he feels most comfortable talking to. He had a visit with his wife this past weekend that did not go so well and has him feeling down today. He believes he is "at the end of my rope" with his immediate family. Denies SI/HI or AV hallucinations. On scale 1-10 as 10 being worst he rates depression at 8 and anxiety at 8. No issues or complaints. No side effects from medication.

## 2019-04-22 ENCOUNTER — Other Ambulatory Visit: Payer: Self-pay

## 2019-04-22 ENCOUNTER — Ambulatory Visit (HOSPITAL_COMMUNITY): Payer: No Typology Code available for payment source | Admitting: Licensed Clinical Social Worker

## 2019-04-22 ENCOUNTER — Other Ambulatory Visit (HOSPITAL_COMMUNITY): Payer: No Typology Code available for payment source

## 2019-04-22 NOTE — Psych (Signed)
Virtual Visit via Video Note  I connected with Mark [name] on 04/07/19 at  9:00 AM EDT by a video enabled telemedicine application and verified that I am speaking with the correct person using two identifiers.   I discussed the limitations of evaluation and management by telemedicine and the availability of in person appointments. The patient expressed understanding and agreed to proceed.   I discussed the assessment and treatment plan with the patient. The patient was provided an opportunity to ask questions and all were answered. The patient agreed with the plan and demonstrated an understanding of the instructions.   The patient was advised to call back or seek an in-person evaluation if the symptoms worsen or if the condition fails to improve as anticipated.  Pt was provided 240 minutes of non-face-to-face time during this encounter.   [name], LCSW    [facility] PHP THERAPIST PROGRESS NOTE  Mark [name] [id number]  Session Time: 9:00 - 10:00  Participation Level: Active  Behavioral Response: CasualAlertDepressed  Type of Therapy: Group Therapy  Treatment Goals addressed: Coping  Interventions: CBT, DBT, Supportive and Reframing  Summary: Clinician led check-in regarding current stressors and situation, and review of patient completed daily inventory. Clinician utilized active listening and empathetic response and validated patient emotions. Clinician facilitated processing group on pertinent issues  Therapist Response: Mark [name] is a 59 y.o. male who presents with depression and anxiety symptoms.  Patient arrived within time allowed and reports that he isfeeling "determined."Patient rates his mood at a 6 on a scale of 1-10 with 10 being great. Pt reports he is "trying to be positive" however is also expecting to be disappointed if things seems to be going well. Pt reports getting one task on his to-do list completed. Pt struggles with consistent up-keep of ADLs. Pt  engaged in discussion.        Session Time: 10:00-11:00  Participation Level:Active  Behavioral Response:CasualAlertDepressed  Type of Therapy:Group Therapy  Treatment Goals addressed: Coping  Interventions:CBT, DBT, Solution Focused, Supportive and Reframing  Summary:Cln led discussion on interpersonal communication and reframing intent. Cln encouraged pt's to consider the reduction or elimination of ngeative consequences for themselves as the motivation rather than needing to be "nice" or another arbitrary standard. Group discussed this concept and different feelings around it. Group members shared ways in which they have altered the ways they approach people and how it has gone for them.    Therapist Response: Pt engaged in discussion and reports he struggles with not sharing his thoughts and emotion when he is upset. Pt able to process his difficulties. Pt reports willingness to reframe his approach with people.       Session Time: 11:00- 12:00  Participation Level:Active  Behavioral Response:CasualAlertDepressed  Type of Therapy: Group Therapy, OT  Treatment Goals addressed: Coping  Interventions:Psychosocial skills training, Supportive  Summary:Occupational Therapy group  Therapist Response:Patient engaged in group. See OT note.       Session Time: 12:00 -1:00  Participation Level:Active  Behavioral Response:CasualAlertDepressed  Type of Therapy:Group therapy  Treatment Goals addressed: Coping  Interventions:CBT; Solution focused; Supportive; Reframing  Summary:12:00 - 12:50:Cln continued topic of distress tolerance skills. Cln reviewed ACCEPTS skills discussed yesterday and any practice group members completed. Cln introduced the P-T-S skills and group members discussed how they can apply and practice this in their own lives.  12:50 -1:00 Clinician led check-out. Clinician assessed for immediate  needs, medication compliance and efficacy, and safety concerns  Therapist Response:12:00 - 12:50:Pt engaged in  discussion and reports understanding of P-T-S skills. Pt identifies thought skills as most interesting and applicable to him.  At check-out, patient rates hismood at [facility] a scale of 1-10 with 10 being great. Patient reports afternoon plans of organizing his office and watching tv. Patient demonstrates some progress as evidenced by achieving a goal yesterday. Patient denies SI/HI/self-harm thoughts at the end of group.      Suicidal/Homicidal: Nowithout intent/plan   Plan: Pt will continue in PHP while working to increase stabilization post-hospital discharge, decrease depression and anxiety symptoms, and increase ability to manage symptoms in a healthy manner.   Diagnosis: MDD (major depressive disorder), recurrent severe, without psychosis ([place]) [F33.2]    1. MDD (major depressive disorder), recurrent severe, without psychosis ([place])   2. Generalized anxiety disorder   3. PTSD (post-traumatic stress disorder)       Lorin Glass, LCSW 04/22/2019

## 2019-04-23 ENCOUNTER — Other Ambulatory Visit (HOSPITAL_COMMUNITY)
Payer: No Typology Code available for payment source | Attending: Psychiatry | Admitting: Licensed Clinical Social Worker

## 2019-04-23 ENCOUNTER — Ambulatory Visit (HOSPITAL_COMMUNITY): Payer: No Typology Code available for payment source

## 2019-04-23 ENCOUNTER — Other Ambulatory Visit: Payer: Self-pay

## 2019-04-23 DIAGNOSIS — F332 Major depressive disorder, recurrent severe without psychotic features: Secondary | ICD-10-CM

## 2019-04-23 DIAGNOSIS — F4310 Post-traumatic stress disorder, unspecified: Secondary | ICD-10-CM

## 2019-04-23 DIAGNOSIS — F419 Anxiety disorder, unspecified: Secondary | ICD-10-CM | POA: Insufficient documentation

## 2019-04-23 DIAGNOSIS — R45851 Suicidal ideations: Secondary | ICD-10-CM | POA: Insufficient documentation

## 2019-04-23 DIAGNOSIS — F411 Generalized anxiety disorder: Secondary | ICD-10-CM

## 2019-04-23 DIAGNOSIS — F339 Major depressive disorder, recurrent, unspecified: Secondary | ICD-10-CM | POA: Insufficient documentation

## 2019-04-23 NOTE — Psych (Signed)
Virtual Visit via Video Note  I connected with Mark [name] on 04/09/19 at  9:00 AM EDT by a video enabled telemedicine application and verified that I am speaking with the correct person using two identifiers.   I discussed the limitations of evaluation and management by telemedicine and the availability of in person appointments. The patient expressed understanding and agreed to proceed.   I discussed the assessment and treatment plan with the patient. The patient was provided an opportunity to ask questions and all were answered. The patient agreed with the plan and demonstrated an understanding of the instructions.   The patient was advised to call back or seek an in-person evaluation if the symptoms worsen or if the condition fails to improve as anticipated.  Pt was provided 240 minutes of non-face-to-face time during this encounter.   Lorin Glass, LCSW    [facility] PHP THERAPIST PROGRESS NOTE  Mark [name] [id number]  Session Time: 9:00 - 10:00  Participation Level: Active  Behavioral Response: CasualAlertDepressed  Type of Therapy: Group Therapy  Treatment Goals addressed: Coping  Interventions: CBT, DBT, Supportive and Reframing  Summary: Clinician led check-in regarding current stressors and situation, and review of patient completed daily inventory. Clinician utilized active listening and empathetic response and validated patient emotions. Clinician facilitated processing group on pertinent issues  Therapist Response: Mark [name] is a 59 y.o. male who presents with depression and anxiety symptoms.  Patient arrived within time allowed and reports that he isfeeling "rough."Patient rates his mood at a 5 on a scale of 1-10 with 10 being great. Pt reports he did not sleep well, has a headache, and is experiencing increased anxiety. Pt states he did not do anything yesterday except watch tv. Pt reports decreased mood. Pt able to process. Pt engaged in discussion.        Session Time: 10:00-11:00  Participation Level:Active  Behavioral Response:CasualAlertDepressed  Type of Therapy: Group Therapy  Treatment Goals addressed: Coping  Interventions:CBT, DBT, Solution Focused, Supportive and Reframing  Summary:Cln led discussion on worries about returning back to "normal" every day life. Group members shared concerns of returning to work, addressing their absence or behaviors recently, and keeping up good habits without the group structure. Cln validated and offered support. Cln discussed the importance of planning ahead and thinking now about how specifically to incorporate new skills into the daily routine they will be entering. Group brainstormed ways to help remember skills, ways to incorporate skills in different environments, and how to put supports in place for themselves.   Therapist Response: Pt engaged in discussion. Pt reports worry about maintaining new habits after leaving group. Pt identifies he can add reminders and review group materials.      Session Time: 11:00- 12:00  Participation Level:Active  Behavioral Response:CasualAlertDepressed  Type of Therapy: Group Therapy  Treatment Goals addressed: Coping  Interventions:CBT, DBT, Solution Focused, Supportive and Reframing  Summary:Cln continued DBT distress tolerance skills and introduced the self-soothe skill. Cln discussed situations in which self-soothe skill can be useful and group discussed ways they could utilize the skill in their every day lives.   Therapist Response: Pt engaged in discussion and reports willingness to apply the self-soothe skill. Pt identifies r&b music and cool temperatures as ways he can practice the skill.       Session Time: 12:00 -1:00  Participation Level:Active  Behavioral Response:CasualAlertDepressed  Type of Therapy: Group therapy  Treatment Goals addressed: Coping  Interventions:CBT;  Solution focused; Supportive; Reframing  Summary:12:00 - 12:50:Cln led  discussion on grounding skills. Cln introduced grounding skills as a supplement to distress tolerance skills and group made connections between distress tolerance skills and which grounding strategies can be utilized for these skills as well. Group brainstormed new ways to practice.   12:50 -1:00 Clinician led check-out. Clinician assessed for immediate needs, medication compliance and efficacy, and safety concerns  Therapist Response:12:00 - 12:50:Pt engaged in discussion. Pt is able to determine  new grounding practices to utilize for distress tolerance.  At check-out, patient rates hismood at [facility] a scale of 1-10 with 10 being great. Patient reports afternoon plans of folding laundry. Patient demonstrates some progress as evidenced by improved mood throughout group. Patient denies SI/HI/self-harm thoughts at the end of group.      Suicidal/Homicidal: Nowithout intent/plan   Plan: Pt will continue in PHP while working to increase stabilization post-hospital discharge, decrease depression and anxiety symptoms, and increase ability to manage symptoms in a healthy manner.   Diagnosis: MDD (major depressive disorder), recurrent severe, without psychosis (HCC) [F33.2]    1. MDD (major depressive disorder), recurrent severe, without psychosis (HCC)   2. Generalized anxiety disorder   3. PTSD (post-traumatic stress disorder)       [name], LCSW 04/23/2019

## 2019-04-23 NOTE — Psych (Signed)
Virtual Visit via Video Note  I connected with Mark [name] on 04/08/19 at  9:00 AM EDT by a video enabled telemedicine application and verified that I am speaking with the correct person using two identifiers.   I discussed the limitations of evaluation and management by telemedicine and the availability of in person appointments. The patient expressed understanding and agreed to proceed.   I discussed the assessment and treatment plan with the patient. The patient was provided an opportunity to ask questions and all were answered. The patient agreed with the plan and demonstrated an understanding of the instructions.   The patient was advised to call back or seek an in-person evaluation if the symptoms worsen or if the condition fails to improve as anticipated.  Pt was provided 240 minutes of non-face-to-face time during this encounter.   Mark Glass, LCSW    [facility] PHP THERAPIST PROGRESS NOTE  Mark [name] [id number]  Session Time: 9:00 - 10:00  Participation Level: Active  Behavioral Response: CasualAlertDepressed  Type of Therapy: Group Therapy  Treatment Goals addressed: Coping  Interventions: CBT, DBT, Supportive and Reframing  Summary: Clinician led check-in regarding current stressors and situation, and review of patient completed daily inventory. Clinician utilized active listening and empathetic response and validated patient emotions. Clinician facilitated processing group on pertinent issues  Therapist Response: Mark [name] is a 59 y.o. male who presents with depression and anxiety symptoms.  Patient arrived within time allowed and reports that he isfeeling "okay."Patient rates his mood at a 6 on a scale of 1-10 with 10 being great. Pt reports he struggled to do what he hoped yesterday and spent the day watching tv and playing games on his tablet. Pt states he had negative self talk and judgment for not being productive. Pt able to process. Pt engaged in  discussion.        Session Time: 10:00 -11:00  Participation Level:Active  Behavioral Response:CasualAlertDepressed  Type of Therapy: Group Therapy, psychoeducation, psychotherapy  Treatment Goals addressed: Coping  Interventions:CBT, DBT, Solution Focused, Supportive and Reframing  Summary:Cln introduced DBT's [facility] practice as a way to increase interpersonal effectiveness. Group members discussed issues they are having with feeling understood in certain relationships and group worked to apply [place] to address those concerns.   Therapist Response: Patientengaged in discussion and reports struggling with managing negative emotions when he feels they are justified. Pt able to apply [facility] in examples with group.       Session Time: 11:00 -12:00  Participation Level:Active  Behavioral Response:CasualAlertDepressed  Type of Therapy: Group Therapy, psychotherapy  Treatment Goals addressed: Coping  Interventions:Strengths based, reframing, Supportive,   Summary:Spiritual Care group  Therapist Response: Patient engaged in group. See chaplain note.        Session Time: 12:00- 1:00  Participation Level:Active  Behavioral Response:CasualAlertDepressed  Type of Therapy: Group Therapy, Psychoeducation  Treatment Goals addressed: Coping  Interventions:relaxation training; Supportive; Reframing  Summary:12:00 - 12:50: Relaxation group: Cln led group focused on retraining the body's response to stress.  12:50 -1:00 Clinician led check-out. Clinician assessed for immediate needs, medication compliance and efficacy, and safety concerns  Therapist Response:Pt engaged in activity and discussion. At check-out, patient rates hismood at [facility] a scale of 1-10 with 10 being great. Patient reports afternoon plans of napping, folding laundry, and making calls. Patient demonstrates some progress as evidenced by  openness to reframing negative thinking. Patient denies SI/HI/self-harm thoughts at the end of group.      Suicidal/Homicidal: Nowithout intent/plan  Plan: Pt will continue in PHP while working to increase stabilization post-hospital discharge, decrease depression and anxiety symptoms, and increase ability to manage symptoms in a healthy manner.   Diagnosis: MDD (major depressive disorder), recurrent severe, without psychosis (HCC) [F33.2]    1. MDD (major depressive disorder), recurrent severe, without psychosis (HCC)   2. Generalized anxiety disorder   3. PTSD (post-traumatic stress disorder)       [name], LCSW 04/23/2019

## 2019-04-24 ENCOUNTER — Other Ambulatory Visit (HOSPITAL_COMMUNITY): Payer: No Typology Code available for payment source | Admitting: Licensed Clinical Social Worker

## 2019-04-24 ENCOUNTER — Other Ambulatory Visit: Payer: Self-pay

## 2019-04-24 ENCOUNTER — Other Ambulatory Visit (HOSPITAL_COMMUNITY): Payer: No Typology Code available for payment source

## 2019-04-24 DIAGNOSIS — F411 Generalized anxiety disorder: Secondary | ICD-10-CM

## 2019-04-24 DIAGNOSIS — F339 Major depressive disorder, recurrent, unspecified: Secondary | ICD-10-CM | POA: Diagnosis not present

## 2019-04-24 DIAGNOSIS — F4310 Post-traumatic stress disorder, unspecified: Secondary | ICD-10-CM

## 2019-04-24 DIAGNOSIS — F332 Major depressive disorder, recurrent severe without psychotic features: Secondary | ICD-10-CM

## 2019-04-27 ENCOUNTER — Other Ambulatory Visit (HOSPITAL_COMMUNITY): Payer: No Typology Code available for payment source | Admitting: Licensed Clinical Social Worker

## 2019-04-27 ENCOUNTER — Ambulatory Visit (HOSPITAL_COMMUNITY): Payer: No Typology Code available for payment source | Admitting: Licensed Clinical Social Worker

## 2019-04-27 ENCOUNTER — Other Ambulatory Visit: Payer: Self-pay

## 2019-04-27 DIAGNOSIS — F4310 Post-traumatic stress disorder, unspecified: Secondary | ICD-10-CM

## 2019-04-27 DIAGNOSIS — F411 Generalized anxiety disorder: Secondary | ICD-10-CM

## 2019-04-27 DIAGNOSIS — F332 Major depressive disorder, recurrent severe without psychotic features: Secondary | ICD-10-CM

## 2019-04-27 DIAGNOSIS — F339 Major depressive disorder, recurrent, unspecified: Secondary | ICD-10-CM | POA: Diagnosis not present

## 2019-04-27 NOTE — Progress Notes (Signed)
Spoke with patient via Webex video call, used 2 identifiers to correctly identify patient. Had a bad weekend. States she slept, sat around watching television and got nothing accomplished. Had a panic attack over the weekend after he became overwhelmed with "stuff."  His is suppose to be discharged from [facility] Friday this week but not sure where he will be going for step down therapy. He believes the [place] will do his therapy and anger management. He is asking for a medication to help with focus. He has trouble concentrating on a single task and usually does 3-4 things at a time while his mind wonders. Message sent to NP to discuss options. Sleeping well. On scale 1-10 as 10 being worst he rates depression at 6 and anxiety at 8/9. PHQ9=20. Denies SI/HI but feels hopeless. Denies AV hallucinations. No other issues or complaints.

## 2019-04-28 ENCOUNTER — Other Ambulatory Visit: Payer: Self-pay

## 2019-04-28 ENCOUNTER — Other Ambulatory Visit (HOSPITAL_COMMUNITY): Payer: No Typology Code available for payment source | Admitting: Licensed Clinical Social Worker

## 2019-04-28 ENCOUNTER — Encounter (HOSPITAL_COMMUNITY): Payer: Self-pay

## 2019-04-28 ENCOUNTER — Other Ambulatory Visit (HOSPITAL_COMMUNITY): Payer: No Typology Code available for payment source | Admitting: Specialist

## 2019-04-28 DIAGNOSIS — F339 Major depressive disorder, recurrent, unspecified: Secondary | ICD-10-CM | POA: Diagnosis not present

## 2019-04-28 DIAGNOSIS — F331 Major depressive disorder, recurrent, moderate: Secondary | ICD-10-CM

## 2019-04-28 DIAGNOSIS — F332 Major depressive disorder, recurrent severe without psychotic features: Secondary | ICD-10-CM

## 2019-04-28 DIAGNOSIS — F411 Generalized anxiety disorder: Secondary | ICD-10-CM

## 2019-04-28 DIAGNOSIS — R4589 Other symptoms and signs involving emotional state: Secondary | ICD-10-CM

## 2019-04-28 DIAGNOSIS — R41844 Frontal lobe and executive function deficit: Secondary | ICD-10-CM

## 2019-04-28 DIAGNOSIS — F4310 Post-traumatic stress disorder, unspecified: Secondary | ICD-10-CM

## 2019-04-28 NOTE — Therapy (Signed)
Virtual Visit via Video Note  I connected with Mark [name] on 04/28/19 at  11:00 AM EDT by a video enabled telemedicine application and verified that I am speaking with the correct person using two identifiers.   I discussed the limitations of evaluation and management by telemedicine and the availability of in person appointments. The patient expressed understanding and agreed to proceed.   I discussed the assessment and treatment plan with the patient. The patient was provided an opportunity to ask questions and all were answered. The patient agreed with the plan and demonstrated an understanding of the instructions.   The patient was advised to call back or seek an in-person evaluation if the symptoms worsen or if the condition fails to improve as anticipated.  I provided 60 minutes of non-face-to-face time during this encounter.     [facility] PARTIAL HOSPITALIZATION PROGRAM [address] [place], [place], [address] Phone: [phone]   Fax:  [phone]  Occupational Therapy Treatment  Patient Details  Name: Mark [name] MRN: [id number] Date of Birth: 02/15/1960 Referring Provider (OT): Hillery Jacks, NP   Encounter Date: 04/28/2019  OT End of Session - 04/28/19 1555    Visit Number  7    Number of Visits  8    Date for OT Re-Evaluation  04/28/19    Authorization Type  [employer] and [employer]    Authorization Time Period  [employer] authorized 8 visits    Authorization - Visit Number  7    Authorization - Number of Visits  8    Progress Note Due on Visit  10    OT Start Time  1100    OT Stop Time  1200    OT Time Calculation (min)  60 min    Activity Tolerance  Patient tolerated treatment well    Behavior During Therapy  [employer] for tasks assessed/performed       Past Medical History:  Diagnosis Date  . Above knee amputation status, right   . Anxiety   . Depression   . Diabetes mellitus, type II (HCC)   . Hyperlipemia   . Hypertension   . PTSD  (post-traumatic stress disorder)   . Shoulder pain     Past Surgical History:  Procedure Laterality Date  . HERNIA REPAIR    . LEG AMPUTATION Right   . parenatal cyst      There were no vitals filed for this visit.  Subjective Assessment - 04/28/19 1555    Currently in Pain?  No/denies          S:  I dont care what others think any more  O:  Patient participated in skilled occupational therapy group this day focusing on improving self-esteem.  At opening of group, members shared one of their proudest accomplishments, as well as the state of their current self-esteem.  Group was educated on definition of self-esteem, high and low self-esteem, Causes of low self-esteem, including past experiences, social media, isolation, health, other peoples viewpoints, relationships.  Group discussed the close knit relationship of low self-esteem and mental health problems.  Group was then educated on ways to improve self-esteem, including doing tasks that provide enjoyment, work, hobbies, involvement in positive relationships, assertiveness, physical activity, sleep, diet, challenging self, positive thinking.  Group also acknowledged that we must determine the root cause of our negative beliefs and challenge those beliefs as a first step towards improving self-esteem.  Other tools the group learned were focusing on the positive, fact checking,  and learning self-compassion.  Group concluded with patient sharing one new strategy they will implement to build their self-esteem.   A:  Patient was engaged in group this date.  Patient shared that he made a family crest for [employer] and respect, resiliency, and responsibility are the foundations of this - he is proud of these.  He committed to examine his other attributes he wrote down and continue to build upon those. P:  Continue skilled OT group 2 times per week for 3 weeks, next group session will focus on improving assertiveness for improved communication,  self-esteem, and independence.                   OT Education - 04/28/19 1555    Education Details  self esteem building tips    Person(s) Educated  Patient    Methods  Explanation;Handout    Comprehension  Verbalized understanding       OT Short Term Goals - 04/03/19 1604      OT SHORT TERM GOAL #1   Title  Pt will be educated on strategies to improve psychosocial skills needed to participate fully in all daily, work, and leisure activities    Time  4    Period  Weeks    Status  On-going    Target Date  04/28/19      OT SHORT TERM GOAL #2   Title  Pt will apply psychosocial skills and coping mechanisms to daily activities in order to function independently and reintegrate into community    Time  4    Period  Weeks    Status  On-going      OT SHORT TERM GOAL #3   Title  Pt will recall and/or apply 1-3 sleep hygiene strategies to improve BADL functioning prior to reintegrating into community    Time  4    Period  Weeks    Status  On-going      OT SHORT TERM GOAL #4   Title  Pt will engage in goal setting to improve functional BADL/IADL routine upon reintegrating into community    Time  4    Period  Weeks    Status  On-going      OT SHORT TERM GOAL #5   Title  Pt will create and/or implement functional routine to improve successful engagement in daily tasks prior to reintegrating into community    Time  4    Period  Weeks    Status  On-going               Plan - 04/28/19 1555    Body Structure / Function / Physical Skills  ADL    Cognitive Skills  Emotional;Energy/Drive;Thought    Psychosocial Skills  Coping Strategies;Environmental  Adaptations;Habits;Interpersonal Interaction;Routines and Behaviors       Patient will benefit from skilled therapeutic intervention in order to improve the following deficits and impairments:   Body Structure / Function / Physical Skills: ADL Cognitive Skills: Emotional, Energy/Drive, Thought Psychosocial  Skills: Coping Strategies, Environmental  Adaptations, Habits, Interpersonal Interaction, Routines and Behaviors   Visit Diagnosis: Major depressive disorder, recurrent episode, moderate (HCC)  Generalized anxiety disorder  Difficulty coping  Executive function deficit    Problem List Patient Active Problem List   Diagnosis Date Noted  . PTSD (post-traumatic stress disorder) 04/15/2017  . Major depressive disorder, recurrent episode, moderate (HCC) 04/30/2016  . Generalized anxiety disorder 04/30/2016    Shirlean Mylar, MHA, OTR/L [phone]  04/28/2019, 3:57  PM  [facility] PARTIAL HOSPITALIZATION PROGRAM [place], [place], [address] Phone: [phone]   Fax:  [phone]  Name: Mark [name] MRN: [id number] Date of Birth: 1960-10-01
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
## 2019-04-28 NOTE — Psych (Signed)
Virtual Visit via Video Note  I connected with Mark [name] on 04/10/19 at  9:00 AM EDT by a video enabled telemedicine application and verified that I am speaking with the correct person using two identifiers.   I discussed the limitations of evaluation and management by telemedicine and the availability of in person appointments. The patient expressed understanding and agreed to proceed.   I discussed the assessment and treatment plan with the patient. The patient was provided an opportunity to ask questions and all were answered. The patient agreed with the plan and demonstrated an understanding of the instructions.   The patient was advised to call back or seek an in-person evaluation if the symptoms worsen or if the condition fails to improve as anticipated.  Pt was provided 240 minutes of non-face-to-face time during this encounter.   Lorin Glass, LCSW    [name] PHP THERAPIST PROGRESS NOTE  [name] [id number]  Session Time: 9:00 - 10:00  Participation Level: Active  Behavioral Response: CasualAlertDepressed  Type of Therapy: Group Therapy  Treatment Goals addressed: Coping  Interventions: CBT, DBT, Supportive and Reframing  Summary: Clinician led check-in regarding current stressors and situation, and review of patient completed daily inventory. Clinician utilized active listening and empathetic response and validated patient emotions. Clinician facilitated processing group on pertinent issues  Therapist Response: Mark [name] is a 59 y.o. male who presents with depression and anxiety symptoms.  Patient arrived within time allowed and reports that he isfeeling "ehhh."Patient rates his mood at a 4 on a scale of 1-10 with 10 being great. Pt reports he continues to be in a low mood and did not do anything he had planned yesterday. Pt states he is running out of his pain medication and has not received his refill yet which is worrying him. Pt continues to struggle with  hygiene and has presented in the same shirt every day this week. Pt able to process. Pt engaged in discussion.       Session Time: 10:00-11:00  Participation Level:Active  Behavioral Response:CasualAlertDepressed  Type of Therapy:Group Therapy  Treatment Goals addressed: Coping  Interventions:CBT, DBT, Solution Focused, Supportive and Reframing  Summary:Cln led discussion on the struggles with supporting others when you are not doing well yourself. Group members shared feelings of guilt, frustration, and being overwhelmed by handling other people's distress. Group members provided support and feedback to one another. Cln encouraged pt's to offer themselves grace.   Therapist Response:  Pt engaged in discussion and reports supporting anyone when he is struggling is overwhelming. Pt is able to connect and relate to other group members and process her concerns.       Session Time: 11:00- 12:00  Participation Level:Active  Behavioral Response:CasualAlertDepressed  Type of Therapy: Group Therapy, OT  Treatment Goals addressed: Coping  Interventions:Psychosocial skills training, Supportive  Summary:Occupational Therapy group  Therapist Response:Patient engaged in group. See OT note.       Session Time: 12:00 -1:00  Participation Level:Active  Behavioral Response:CasualAlertDepressed  Type of Therapy:Group therapy  Treatment Goals addressed: Coping  Interventions:CBT; Solution focused; Supportive; Reframing  Summary:12:00 - 12:50: Cln introduced topic of "fair fighting." Cln discussed conflict styles and the way conflict can impact relationships. Group members shared common issues they have with communicating in conflict. Cln utilized handout "Fair Fighting" to offer solutions to these communication issues.  12:50 -1:00 Clinician led check-out. Clinician assessed for immediate needs, medication compliance and  efficacy, and safety concerns  Therapist Response:12:00 - 12:50:Pt engaged in discussion  and reports he is conflict avoidant and conflict seeking depending. Pt identifies struggling with speaking up and managing his emotions during conflict. At check-out, patient rates hismood at Mark [name] a scale of 1-10 with 10 being great. Patient reports afternoon plans of getting his second vaccine shot and running errands. Patient demonstrates some progress as evidenced by increased willingness to work through negative emotions. Patient denies SI/HI/self-harm thoughts at the end of group.      Suicidal/Homicidal: Nowithout intent/plan   Plan: Pt will continue in PHP while working to increase stabilization post-hospital discharge, decrease depression and anxiety symptoms, and increase ability to manage symptoms in a healthy manner.   Diagnosis: MDD (major depressive disorder), recurrent severe, without psychosis (HCC) [F33.2]    1. MDD (major depressive disorder), recurrent severe, without psychosis (HCC)   2. Generalized anxiety disorder   3. PTSD (post-traumatic stress disorder)       [name], LCSW 04/28/2019

## 2019-04-28 NOTE — Psych (Signed)
Virtual Visit via Video Note  I connected with [name] on 04/14/19 at  9:00 AM EDT by a video enabled telemedicine application and verified that I am speaking with the correct person using two identifiers.   I discussed the limitations of evaluation and management by telemedicine and the availability of in person appointments. The patient expressed understanding and agreed to proceed.   I discussed the assessment and treatment plan with the patient. The patient was provided an opportunity to ask questions and all were answered. The patient agreed with the plan and demonstrated an understanding of the instructions.   The patient was advised to call back or seek an in-person evaluation if the symptoms worsen or if the condition fails to improve as anticipated.  Pt was provided 240 minutes of non-face-to-face time during this encounter.   [name], LCSW    [facility] PHP THERAPIST PROGRESS NOTE  Stella Encarnacion [id number]  Session Time: 9:00 - 10:00  Participation Level: Active  Behavioral Response: CasualAlertDepressed  Type of Therapy: Group Therapy  Treatment Goals addressed: Coping  Interventions: CBT, DBT, Supportive and Reframing  Summary: Clinician led check-in regarding current stressors and situation, and review of patient completed daily inventory. Clinician utilized active listening and empathetic response and validated patient emotions. Clinician facilitated processing group on pertinent issues  Therapist Response: Tad Fancher is a 59 y.o. male who presents with depression and anxiety symptoms.  Patient arrived within time allowed and reports that he isfeeling "pissed."Patient rates his mood at a 3 on a scale of 1-10 with 10 being great. Pt expresses anger regarding a situation with his daughter not listening to him. Pt expresses hopelessness about relationship dynamics not changing. Pt states he attended his veterans support group and church meeting last night and  showered. Pt reports struggling with boundaries. Pt able to process. Pt engaged in discussion.       Session Time: 10:00-11:00  Participation Level:Active  Behavioral Response:CasualAlertDepressed  Type of Therapy: Group Therapy  Treatment Goals addressed: Coping  Interventions:CBT, DBT, Solution Focused, Supportive and Reframing  Summary:Cln led discussion on ways to remind yourself to utilize new skills. Group brainstormed ways such as reminders on your phone, post-it notes, lists, utilizing support system, mantras, and acronyms.  Cln encouraged pt's to consider the practical aspect of these reminders and to set intentions about setting them up.   Therapist Response:  Pt engaged in discussion and reports acronyms and lists are two ways he can increase the use of new skills.       Session Time: 11:00- 12:00  Participation Level:Active  Behavioral Response:CasualAlertDepressed  Type of Therapy: Group Therapy, OT  Treatment Goals addressed: Coping  Interventions:Psychosocial skills training, Supportive  Summary:Occupational Therapy group  Therapist Response:Patient engaged in group. See OT note.       Session Time: 12:00 -1:00  Participation Level:Active  Behavioral Response:CasualAlertDepressed  Type of Therapy: Group therapy  Treatment Goals addressed: Coping  Interventions:CBT; Solution focused; Supportive; Reframing  Summary:12:00 - 12:50: Cln continued topic of cognitive distortions. Group reviewed handout "Unhealthy thought patterns" to continue discussion of common cognitive distortions. Group shared personal examples of how these distortions play out in their lives and how it causes negative impact. 12:50 -1:00 Clinician led check-out. Clinician assessed for immediate needs, medication compliance and efficacy, and safety concerns  Therapist Response:12:00 - 12:50:Pt engaged in discussion and is able  to determine examples from their own lives. Pt identifies false permanence and black and white thinking as most problematic for  them.  At check-out, patient rates hismood at [facility] a scale of 1-10 with 10 being great. Patient reports afternoon plans of de-cluttering and attending an appointment. Patient demonstrates some progress as evidenced by processing through anger. Patient denies SI/HI/self-harm thoughts at the end of group.      Suicidal/Homicidal: Nowithout intent/plan   Plan: Pt will continue in PHP while working to increase stabilization post-hospital discharge, decrease depression and anxiety symptoms, and increase ability to manage symptoms in a healthy manner.   Diagnosis: MDD (major depressive disorder), recurrent severe, without psychosis ([place]) [F33.2]    1. MDD (major depressive disorder), recurrent severe, without psychosis ([place])   2. Difficulty coping   3. PTSD (post-traumatic stress disorder)       Lorin Glass, LCSW 04/28/2019

## 2019-04-29 ENCOUNTER — Other Ambulatory Visit (HOSPITAL_COMMUNITY): Payer: No Typology Code available for payment source | Admitting: Licensed Clinical Social Worker

## 2019-04-29 DIAGNOSIS — F411 Generalized anxiety disorder: Secondary | ICD-10-CM

## 2019-04-29 DIAGNOSIS — F339 Major depressive disorder, recurrent, unspecified: Secondary | ICD-10-CM | POA: Diagnosis not present

## 2019-04-29 DIAGNOSIS — F4310 Post-traumatic stress disorder, unspecified: Secondary | ICD-10-CM

## 2019-04-29 DIAGNOSIS — F332 Major depressive disorder, recurrent severe without psychotic features: Secondary | ICD-10-CM

## 2019-04-29 NOTE — Psych (Signed)
Virtual Visit via Video Note  I connected with Mark [name] on 04/15/19 at  9:00 AM EDT by a video enabled telemedicine application and verified that I am speaking with the correct person using two identifiers.   I discussed the limitations of evaluation and management by telemedicine and the availability of in person appointments. The Mark [name] expressed understanding and agreed to proceed.   I discussed the assessment and treatment plan with the Mark [name]. The Mark [name] was provided an opportunity to ask questions and all were answered. The Mark [name] agreed with the plan and demonstrated an understanding of the instructions.   The Mark [name] was advised to call back or seek an in-person evaluation if the symptoms worsen or if the condition fails to improve as anticipated.  Pt was provided 240 minutes of non-face-to-face time during this encounter.   Mark Glass, LCSW    [facility] PHP THERAPIST PROGRESS NOTE  Mark [name] [id number]  Session Time: 9:00 - 10:00  Participation Level: Active  Behavioral Response: CasualAlertDepressed  Type of Therapy: Group Therapy  Treatment Goals addressed: Coping  Interventions: CBT, DBT, Supportive and Reframing  Summary: Clinician led check-in regarding current stressors and situation, and review of Mark [name] completed daily inventory. Clinician utilized active listening and empathetic response and validated Mark [name] emotions. Clinician facilitated processing group on pertinent issues  Therapist Response: Mark [name] is a 59 y.o. male who presents with depression and anxiety symptoms.  Mark [name] arrived within time allowed and reports that he isfeeling "hopeful."Mark [name] rates his mood at a 7 on a scale of 1-10 with 10 being great. Pt reports he was productive yesterday which improved his mood. Pt states tried to not talk too much to his family as he was still angry with them. Pt reports struggling with memory issues. Pt able to process. Pt engaged in  discussion.       Session Time: 10:00 -11:00  Participation Level:Active  Behavioral Response:CasualAlertDepressed  Type of Therapy: Group Therapy, psychoeducation, psychotherapy  Treatment Goals addressed: Coping  Interventions:CBT, DBT, Solution Focused, Supportive and Reframing  Summary:Cln led discussion on ways to utilize creativity for coping. Cln introduced the ways in which creativity can benefit our processing. Group discussed creative outlets they have or would like to harness and how to remove barriers to practicing it.   Therapist Response: Pt engaged in discussion and identifies writing and cooking as creative outlets they can utilize. Pt reports barrier to practice as not feeling motivated. Pt able to problem-solve with group.       Session Time: 11:00 -12:00  Participation Level:Active  Behavioral Response:CasualAlertDepressed  Type of Therapy: Group Therapy, psychotherapy  Treatment Goals addressed: Coping  Interventions:Strengths based, reframing, Supportive,   Summary:Spiritual Care group  Therapist Response: Mark [name] engaged in group. See chaplain note.        Session Time: 12:00- 1:00  Participation Level:Active  Behavioral Response:CasualAlertDepressed  Type of Therapy: Group Therapy, Psychoeducation  Treatment Goals addressed: Coping  Interventions:relaxation training; Supportive; Reframing  Summary:12:00 - 12:50: Relaxation group: Cln led group focused on retraining the body's response to stress.  12:50 -1:00 Clinician led check-out. Clinician assessed for immediate needs, medication compliance and efficacy, and safety concerns  Therapist Response:Pt engaged in activity and discussion.  At check-out, Mark [name] rates hismood at [facility] a scale of 1-10 with 10 being great. Mark [name] reports afternoon plans of taking medicine and a nap due to being in pain. Mark [name] demonstrates some  progress as evidenced by seeking to get to a more neutral emotion space before having  big discussions. Mark [name] denies SI/HI/self-harm thoughts at the end of group.      Suicidal/Homicidal: Nowithout intent/plan   Plan: Pt will continue in PHP while working to increase stabilization post-hospital discharge, decrease depression and anxiety symptoms, and increase ability to manage symptoms in a healthy manner.   Diagnosis: MDD (major depressive disorder), recurrent severe, without psychosis (HCC) [F33.2]    1. MDD (major depressive disorder), recurrent severe, without psychosis (HCC)   2. Generalized anxiety disorder   3. PTSD (post-traumatic stress disorder)       Mark Guiles, LCSW 04/29/2019

## 2019-04-29 NOTE — Progress Notes (Signed)
Virtual Visit via Video Note  I connected with Mark [name] on 04/29/19 at  9:00 AM EDT by a video enabled telemedicine application and verified that I am speaking with the correct person using two identifiers.   I discussed the limitations of evaluation and management by telemedicine and the availability of in person appointments. The patient expressed understanding and agreed to proceed.    I discussed the assessment and treatment plan with the patient. The patient was provided an opportunity to ask questions and all were answered. The patient agreed with the plan and demonstrated an understanding of the instructions.   The patient was advised to call back or seek an in-person evaluation if the symptoms worsen or if the condition fails to improve as anticipated.  I provided 15 minutes of non-face-to-face time during this encounter.   [name], NP   [facility] Partial Hospitalization Outpatient Program Discharge Summary  Mark [name] [id number]  Admission date: 04/01/2019 Discharge date: 04/29/2019  Reason for admission: Per admission assessment note: Mark [name] is a 59 y.o. African American male presents with worsening  Depression related to PTSD symptoms.  Patient reports he recently discharged from inpatient admission from the [place].  Due to worsening suicidal thoughts.  Reports he did not feel as if his antidepressants and anxiety medications was very effective.  States he is unsure of which medication he was titrated off of however states he was prescribed 21 medication however he is now taking 8 pills only.  Continues to report anxiety 9 out of 10 with 10 being the worst.  Reports his children continue to be supportive.  Patient has completed partial hospitalization programming in the past.  Reports he is followed by a therapist Research scientist (medical). Patient was enrolled in partial psychiatric program on 04/01/19.   Progress in Program Toward Treatment Goals: Ongoing,  patient attended and participated with daily group session with active and engaged participation.  Patient continues to endorse mood lability depression and anxiety.  He denies suicidal or homicidal ideations.  Denies auditory or visual hallucinations.  Patient reports good and bad days and is hopeful to start the [place] intensive outpatient programming.  Patient to consider marital counseling.  Patient reported poor concentration and inability to focus.  Patient reported a recent medication adjustment.  Patient was advised to follow-up with primary psychiatrist regarding nonstimulant for reported symptoms.  Patient was receptive to plan.  Support,encouragement  And reassurance was provided.  Progress (rationale): Keep follow-up with Dr. Lolly Mustache and [name] therapist.  Take all medications as prescribed. Keep all follow-up appointments as scheduled.  Do not consume alcohol or use illegal drugs while on prescription medications. Report any adverse effects from your medications to your primary care provider promptly.  In the event of recurrent symptoms or worsening symptoms, call 911, a crisis hotline, or go to the nearest emergency department for evaluation.   [name], NP 04/29/2019

## 2019-04-29 NOTE — Progress Notes (Signed)
/  Pt attended spiritual care group   04/29/2019  11:00-12:00.  Group met via web-ex due to COVID-19 precautions.  Group facilitated by Simone Curia, MDiv, [place]   Group focused on topic of "community."  Members reflected on topic in facilitated dialog, identifying responses to topic and notions they hold of community from their previous experience.  Group members utilized value sort cards to identify top qualities they look for in community.  Engaged in facilitated dialog around their value choices, noting origin of these values, how these are realized in their lives, and strategies for engaging these values.    Spiritual care group drew on Motivational Interviewing, Narrative and Adlerian modalities. Mark [name] was present throughout group.  Engaged in group reflection - noted that he sees community as a series of circles.  He described needing to hold different values for different circles and reflected on the value cards as language he could use to communicate when in conflict. Mark [name] was supportive to other group members - especially around communication with family.

## 2019-04-30 ENCOUNTER — Other Ambulatory Visit (HOSPITAL_COMMUNITY): Payer: No Typology Code available for payment source | Admitting: Licensed Clinical Social Worker

## 2019-04-30 ENCOUNTER — Other Ambulatory Visit: Payer: Self-pay

## 2019-04-30 DIAGNOSIS — F4310 Post-traumatic stress disorder, unspecified: Secondary | ICD-10-CM

## 2019-04-30 DIAGNOSIS — F411 Generalized anxiety disorder: Secondary | ICD-10-CM

## 2019-04-30 DIAGNOSIS — F339 Major depressive disorder, recurrent, unspecified: Secondary | ICD-10-CM | POA: Diagnosis not present

## 2019-04-30 DIAGNOSIS — F332 Major depressive disorder, recurrent severe without psychotic features: Secondary | ICD-10-CM

## 2019-05-01 ENCOUNTER — Other Ambulatory Visit: Payer: Self-pay

## 2019-05-01 ENCOUNTER — Encounter (HOSPITAL_COMMUNITY): Payer: Self-pay

## 2019-05-01 ENCOUNTER — Other Ambulatory Visit (HOSPITAL_COMMUNITY): Payer: No Typology Code available for payment source | Admitting: Licensed Clinical Social Worker

## 2019-05-01 ENCOUNTER — Other Ambulatory Visit (HOSPITAL_COMMUNITY): Payer: No Typology Code available for payment source | Admitting: Specialist

## 2019-05-01 DIAGNOSIS — F4310 Post-traumatic stress disorder, unspecified: Secondary | ICD-10-CM

## 2019-05-01 DIAGNOSIS — F331 Major depressive disorder, recurrent, moderate: Secondary | ICD-10-CM

## 2019-05-01 DIAGNOSIS — F339 Major depressive disorder, recurrent, unspecified: Secondary | ICD-10-CM | POA: Diagnosis not present

## 2019-05-01 DIAGNOSIS — R4589 Other symptoms and signs involving emotional state: Secondary | ICD-10-CM

## 2019-05-01 DIAGNOSIS — R41844 Frontal lobe and executive function deficit: Secondary | ICD-10-CM

## 2019-05-01 NOTE — Therapy (Signed)
Virtual Visit via Video Note  I connected with Mark [name] on 05/01/19 at  11 00 AM EDT by a video enabled telemedicine application and verified that I am speaking with the correct person using two identifiers.   I discussed the limitations of evaluation and management by telemedicine and the availability of in person appointments. The patient expressed understanding and agreed to proceed.    I discussed the assessment and treatment plan with the patient. The patient was provided an opportunity to ask questions and all were answered. The patient agreed with the plan and demonstrated an understanding of the instructions.   The patient was advised to call back or seek an in-person evaluation if the symptoms worsen or if the condition fails to improve as anticipated.  I provided 60 minutes of non-face-to-face time during this encounter.     [place], [place], [address] Phone: [phone]   Fax:  [phone]  Occupational Therapy Treatment  Patient Details  Name: Mark [name] MRN: [id number] Date of Birth: Aug 10, 1960 Referring Provider (OT): Ricky Ala, NP   Encounter Date: 05/01/2019  OT End of Session - 05/01/19 1317    Visit Number  8    Date for OT Re-Evaluation  05/01/19    Authorization Type  [facility] [place] [facility] 8 visits    Authorization - Visit Number  8    Authorization - Number of Visits  8    Progress Note Due on Visit  10    OT Start Time  1100    OT Stop Time  1200    OT Time Calculation (min)  60 min    Activity Tolerance  Patient tolerated treatment well    Behavior During Therapy  [facility] for tasks assessed/performed       Past Medical History:  Diagnosis Date  . Above knee amputation status, right   . Anxiety   . Depression   . Diabetes mellitus, type II ([place])   . Hyperlipemia   . Hypertension   . PTSD (post-traumatic stress  disorder)   . Shoulder pain     Past Surgical History:  Procedure Laterality Date  . HERNIA REPAIR    . LEG AMPUTATION Right   . parenatal cyst      There were no vitals filed for this visit.  Subjective Assessment - 05/01/19 1317    Currently in Pain?  No/denies      S:  I tend to get irritated when I say no and people keep on asking then I just say whatever and do it.   O:Group began with reflection on circle of control from previous session.  Group members shared their reflections and progress with use of this coping tool.  Group focus today was on communication tool of assertiveness.  Group completed an assertive quiz, which identified if they tended to be assertive all of the time, often, occasionally, or never.  Group members reflected on whether their score correlated with their beliefs in their actions.  Group explored connotation of assertiveness and determined it was almost always a positive trait.  Group was educated on benefits of being assertive, including:  self esteem, goal achievement, anxiety reduction, development of mutual respect for others, improved decision making and empowerment, and improved self expression.  Group reflected on roadblocks they experience that prevents them from being assertive, including lack of confidence, fearful of coming across as aggressive,  and being too emotional to do so.  Patient was educated on definition of passive, assertive, and aggressive communication.  Benefits and drawbacks of each communication style, nonverbal communication/body language associated with each communication style.  Tips for communicating assertively were shared with group, including:  use of I statements, clearly stating your needs, non verbal skills, self respect, speaking calmly, planning and rehearsing conversation/email/text response, scheduling time to respond to an unplanned conversation that causes emotional response/trigger, and use of the x/y/z formula.  The x y z  formula uses the following formula:   I feel X when you do Y in situation Z and I would like _________.  (X = emotion, Y = specific behavior , Z = specific situation).  Each member then formulated a xyz statement for a particular situation they are currently encountering and shared with the group.  Group and OT provided feedback on components being present or not in statement.  Group also discussed and were educated on strategies to assertively sayi no; including no as a sentence, assertively refusing requests, assertive ways of saying no, and steps in learning to say no.  Mark [name] required some prompting with his xyz statement to make it more specific rather than general, with therapists assistance he was able to develop the following statement:  I feel irritated when you ask me to move every time we talk.  I would like to request that we set a move date of the end of August.   A:  Mark [name] very receptive to suggestions for assertive communication this date, recording strategies to say no and patients bill of rights.  Mark [name] has met his OT goals. P:  DC from skilled OT intervention this date.                    OT Education - 05/01/19 1317    Education Details  assertiveness tips    Person(s) Educated  Patient    Methods  Explanation;Handout    Comprehension  Verbalized understanding       OT Short Term Goals - 05/01/19 1318      OT SHORT TERM GOAL #1   Title  Pt will be educated on strategies to improve psychosocial skills needed to participate fully in all daily, work, and leisure activities    Time  4    Period  Weeks    Status  Achieved    Target Date  04/28/19      OT SHORT TERM GOAL #2   Title  Pt will apply psychosocial skills and coping mechanisms to daily activities in order to function independently and reintegrate into community    Time  4    Period  Weeks    Status  Achieved      OT SHORT TERM GOAL #3   Title  Pt will recall and/or apply 1-3 sleep hygiene strategies to  improve BADL functioning prior to reintegrating into community    Time  4    Period  Weeks    Status  Achieved      OT SHORT TERM GOAL #4   Title  Pt will engage in goal setting to improve functional BADL/IADL routine upon reintegrating into community    Time  4    Period  Weeks    Status  Achieved      OT SHORT TERM GOAL #5   Title  Pt will create and/or implement functional routine to improve successful engagement in daily tasks prior to reintegrating into community  Time  4    Period  Weeks    Status  Achieved               Plan - 05/01/19 1318    Body Structure / Function / Physical Skills  ADL    Cognitive Skills  Emotional;Energy/Drive;Thought    Psychosocial Skills  Coping Strategies;Environmental  Adaptations;Habits;Interpersonal Interaction;Routines and Behaviors       Patient will benefit from skilled therapeutic intervention in order to improve the following deficits and impairments:   Body Structure / Function / Physical Skills: ADL Cognitive Skills: Emotional, Energy/Drive, Thought Psychosocial Skills: Coping Strategies, Environmental  Adaptations, Habits, Interpersonal Interaction, Routines and Behaviors   Visit Diagnosis: Difficulty coping  Executive function deficit  Major depressive disorder, recurrent episode, moderate ([place])    Problem List Patient Active Problem List   Diagnosis Date Noted  . PTSD (post-traumatic stress disorder) 04/15/2017  . Major depressive disorder, recurrent episode, moderate ([place]) 04/30/2016  . Generalized anxiety disorder 04/30/2016    OCCUPATIONAL THERAPY DISCHARGE SUMMARY  Visits from Start of Care: 8  Current functional level related to goals / functional outcomes: See above   Remaining deficits: See above   Education / Equipment: See above  Plan: Patient agrees to discharge.  Patient goals were partially met. Patient is being discharged due to meeting the stated rehab goals.  ?????    Vangie Bicker, [place], OTR/L [phone]  05/01/2019, 1:29 PM  [place], [place], [address] Phone: [phone]   Fax:  [phone]  Name: Dessie Delcarlo MRN: [id number] Date of Birth: March 05, 1960

## 2019-05-04 ENCOUNTER — Other Ambulatory Visit: Payer: Self-pay

## 2019-05-04 ENCOUNTER — Encounter (HOSPITAL_COMMUNITY): Payer: Self-pay | Admitting: Licensed Clinical Social Worker

## 2019-05-04 ENCOUNTER — Ambulatory Visit (INDEPENDENT_AMBULATORY_CARE_PROVIDER_SITE_OTHER): Payer: No Typology Code available for payment source | Admitting: Licensed Clinical Social Worker

## 2019-05-04 DIAGNOSIS — F331 Major depressive disorder, recurrent, moderate: Secondary | ICD-10-CM

## 2019-05-04 DIAGNOSIS — F411 Generalized anxiety disorder: Secondary | ICD-10-CM

## 2019-05-04 DIAGNOSIS — F4310 Post-traumatic stress disorder, unspecified: Secondary | ICD-10-CM

## 2019-05-04 NOTE — Progress Notes (Signed)
Virtual Visit via Video Note  I connected with Mark [name] on 05/04/19 at  5:00 PM EDT by a video enabled telemedicine application and verified that I am speaking with the correct person using two identifiers.   I discussed the limitations of evaluation and management by telemedicine and the availability of in person appointments. The patient expressed understanding and agreed to proceed.  History of Present Illness: [facility], depression, anxiety.    Observations/Objective: During group clinician assessed daily functioning, medication concerns, health concerns and self-care routine. Patient was encouraged to monitor these concerns for daily self care.   Assessment and Plan: Counselor will continue to meet with patient to address treatment plan goals. Patient will continue to follow recommendations of providers and implement skills learned in session.   Follow Up Instructions: I discussed the assessment and treatment plan with the patient. The patient was provided an opportunity to ask questions and all were answered. The patient agreed with the plan and demonstrated an understanding of the instructions.   The patient was advised to call back or seek an in-person evaluation if the symptoms worsen or if the condition fails to improve as anticipated.  I provided 120 minutes of non-face-to-face time during this encounter.   Tamala Manzer S, LCAS

## 2019-05-05 NOTE — Psych (Signed)
Virtual Visit via Video Note  I connected with Mark [name] on 04/16/19 at  9:00 AM EDT by a video enabled telemedicine application and verified that I am speaking with the correct person using two identifiers.   I discussed the limitations of evaluation and management by telemedicine and the availability of in person appointments. The patient expressed understanding and agreed to proceed.   I discussed the assessment and treatment plan with the patient. The patient was provided an opportunity to ask questions and all were answered. The patient agreed with the plan and demonstrated an understanding of the instructions.   The patient was advised to call back or seek an in-person evaluation if the symptoms worsen or if the condition fails to improve as anticipated.  Pt was provided 60 minutes of non-face-to-face time during this encounter.   [name], LCSW    [facility] PHP THERAPIST PROGRESS NOTE  Mark [name] [id number]  Session Time: 9:00 - 10:00  Participation Level: Active  Behavioral Response: CasualAlertDepressed  Type of Therapy: Group Therapy  Treatment Goals addressed: Coping  Interventions: CBT, DBT, Supportive and Reframing  Summary: Clinician led check-in regarding current stressors and situation, and review of patient completed daily inventory. Clinician utilized active listening and empathetic response and validated patient emotions. Clinician facilitated processing group on pertinent issues  Therapist Response: Mark [name] is a 59 y.o. male who presents with depression and anxiety symptoms.  Patient arrived within time allowed and reports that he isfeeling "not good at all."Patient rates his mood at a 2 on a scale of 1-10 with 10 being great. Pt reports he is not feeling well physically or mentally. Pt states he did not sleep well, is experiencing stump pain and is still waiting on his pain medication refill. Pt reports struggling with handling his pain. Pt able  to process. Pt engaged in discussion. *Pt chose to leave group around 10am due to pain and wanting to rest. Pt denies SI/HI before leaving group.       Suicidal/Homicidal: Nowithout intent/plan   Plan: Pt will continue in PHP while working to increase stabilization post-hospital discharge, decrease depression and anxiety symptoms, and increase ability to manage symptoms in a healthy manner.   Diagnosis: MDD (major depressive disorder), recurrent severe, without psychosis (HCC) [F33.2]    1. MDD (major depressive disorder), recurrent severe, without psychosis (HCC)   2. Generalized anxiety disorder   3. PTSD (post-traumatic stress disorder)       [name], LCSW 05/05/2019

## 2019-05-06 ENCOUNTER — Ambulatory Visit (INDEPENDENT_AMBULATORY_CARE_PROVIDER_SITE_OTHER): Payer: No Typology Code available for payment source | Admitting: Licensed Clinical Social Worker

## 2019-05-06 ENCOUNTER — Other Ambulatory Visit: Payer: Self-pay

## 2019-05-06 ENCOUNTER — Encounter (HOSPITAL_COMMUNITY): Payer: Self-pay | Admitting: Licensed Clinical Social Worker

## 2019-05-06 DIAGNOSIS — F331 Major depressive disorder, recurrent, moderate: Secondary | ICD-10-CM | POA: Diagnosis not present

## 2019-05-06 DIAGNOSIS — F4310 Post-traumatic stress disorder, unspecified: Secondary | ICD-10-CM

## 2019-05-06 DIAGNOSIS — F411 Generalized anxiety disorder: Secondary | ICD-10-CM | POA: Diagnosis not present

## 2019-05-06 NOTE — Progress Notes (Signed)
Virtual Visit via Video Note  I connected with Mark [name] on 05/06/19 at 2:00pm EDT by video enabled telemedicine with the correct person using two identifiers.   I discussed the limitations of evaluation and management by telemedicine and the availability of in person appointments. The patient expressed understanding and agreed to proceed.  History of Present Illness: Pt was referred to [facility] OP therapy for PTSD, depression and anxiety by [facility].     Observations/Objective: Verbalize positive and hopeful feelings of the future.  Patient presents WNL. He discussed his psychiatric symptoms and current life events. Patient presents for his initial counseling session since discharge from [facility]. Patient discussed his "take aways" from [facility], coping skills. Patient discussed his current stressors: Family, $, health issues. Cln provided psychoeducation on stress, explaining how a strong support network composed of trusted family, friends, or community members can increase his resilience in times of stress.   Used MI to assist patient with compartmentalizing his stressors.   Assessment and Plan: Counselor will continue to meet with patient to address treatment plan goals. Patient will continue to follow recommendations of providers and implement skills learned in session.  Follow Up Instructions: Counselor will send webex invitation weekly before each individual and group session.   I discussed the assessment and treatment plan with the patient. The patient was provided an opportunity to ask questions and all were answered. The patient agreed with the plan and demonstrated an understanding of the instructions.   The patient was advised to call back or seek an in-person evaluation if the symptoms worsen or if the condition fails to improve as anticipated.  I provided 60 minutes of non-face-to-face time during this encounter.   Mark [name] S, LCAS  .

## 2019-05-06 NOTE — Psych (Signed)
Virtual Visit via Video Note  I connected with Mark [name] on 04/20/19 at  9:00 AM EDT by a video enabled telemedicine application and verified that I am speaking with the correct person using two identifiers.   I discussed the limitations of evaluation and management by telemedicine and the availability of in person appointments. The patient expressed understanding and agreed to proceed.   I discussed the assessment and treatment plan with the patient. The patient was provided an opportunity to ask questions and all were answered. The patient agreed with the plan and demonstrated an understanding of the instructions.   The patient was advised to call back or seek an in-person evaluation if the symptoms worsen or if the condition fails to improve as anticipated.  Pt was provided 180 minutes of non-face-to-face time during this encounter.   Lorin Glass, LCSW    [facility] PHP THERAPIST PROGRESS NOTE  Mark [name] [id number]  Session Time: 9:00 - 10:00  Participation Level: Active  Behavioral Response: CasualAlertDepressed  Type of Therapy: Group Therapy  Treatment Goals addressed: Coping  Interventions: CBT, DBT, Supportive and Reframing  Summary: Clinician led check-in regarding current stressors and situation, and review of patient completed daily inventory. Clinician utilized active listening and empathetic response and validated patient emotions. Clinician facilitated processing group on pertinent issues  Therapist Response: Mark [name] is a 59 y.o. male who presents with depression and anxiety symptoms.  Patient arrived within time allowed and reports that he isfeeling "bad"Patient rates his mood at a 2 on a scale of 1-10 with 10 being great. Pt reports his weekend "sucked" and he and his wife fought frequently. Pt identifies grief over his aunt's passing he did not realize was there. Pt states feelings of anger decreased his self-care and he did not sleep well, eat  regularly, or utilize coping skills. Pt reports struggling with not letting negative emotion stop him. Pt able to process. Pt engaged in discussion.       Session Time: 10:00-11:00  Participation Level:Pt did not attend  Behavioral Response:CasualAlertDepressed  Type of Therapy:Group Therapy  Treatment Goals addressed: Coping  Interventions:CBT, DBT, Solution Focused, Supportive and Reframing  Summary:Cln led discussion on shame and the way it affects [name]. Group members were given space to process their struggles with shame. Cln encouraged pt's to apply cognitive distortions and thought challenging to feelings of shame.   Therapist Response:  Pt reports a family emergency and left session.       Session Time: 11:00- 12:00  Participation Level:Active  Behavioral Response:CasualAlertDepressed  Type of Therapy: Group Therapy, OT  Treatment Goals addressed: Coping  Interventions:Psychosocial skills training, Supportive  Summary:Occupational Therapy group  Therapist Response:Patient engaged in group. See OT note.       Session Time: 12:00 -1:00  Participation Level:Active  Behavioral Response:CasualAlertDepressed  Type of Therapy:Group therapy  Treatment Goals addressed: Coping  Interventions:CBT; Solution focused; Supportive; Reframing  Summary:12:00 - 12:50: Cln introduced topic of boundaries. Cln and group members discussed how they think of boundaries and how learning and applying healthy boundaries can be helpful in recovery. Cln read 12 examples of boundary issues and pt's discussed which examples they could apply to themselves and struggles they have with those concepts.  12:50 -1:00 Clinician led check-out. Clinician assessed for immediate needs, medication compliance and efficacy, and safety concerns  Therapist Response:12:00 - 12:50:Pt engaged in discussion. Pt identifies having poor boundaries and  reports desire to strengthen the boundaries they have.   At check-out, patient rates  hismood at [facility] a scale of 1-10 with 10 being great. Patient reports afternoon plans of doing self-care. Patient demonstrates some progress as evidenced by improvement of mood throughout session. Patient denies SI/HI/self-harm thoughts at the end of group.     Suicidal/Homicidal: Nowithout intent/plan   Plan: Pt will continue in PHP while working to increase stabilization post-hospital discharge, decrease depression and anxiety symptoms, and increase ability to manage symptoms in a healthy manner.   Diagnosis: MDD (major depressive disorder), recurrent severe, without psychosis (HCC) [F33.2]    1. MDD (major depressive disorder), recurrent severe, without psychosis (HCC)   2. Generalized anxiety disorder   3. PTSD (post-traumatic stress disorder)       [name], LCSW 05/06/2019

## 2019-05-06 NOTE — Psych (Signed)
Virtual Visit via Video Note  I connected with Mark [name] on 04/21/19 at  9:00 AM EDT by a video enabled telemedicine application and verified that I am speaking with the correct person using two identifiers.   I discussed the limitations of evaluation and management by telemedicine and the availability of in person appointments. The patient expressed understanding and agreed to proceed.   I discussed the assessment and treatment plan with the patient. The patient was provided an opportunity to ask questions and all were answered. The patient agreed with the plan and demonstrated an understanding of the instructions.   The patient was advised to call back or seek an in-person evaluation if the symptoms worsen or if the condition fails to improve as anticipated.  Pt was provided 240 minutes of non-face-to-face time during this encounter.   [name], LCSW    [facility] PHP THERAPIST PROGRESS NOTE  Mark [name] [id number]  Session Time: 9:00 - 10:00  Participation Level: Active  Behavioral Response: CasualAlertDepressed  Type of Therapy: Group Therapy  Treatment Goals addressed: Coping  Interventions: CBT, DBT, Supportive and Reframing  Summary: Clinician led check-in regarding current stressors and situation, and review of patient completed daily inventory. Clinician utilized active listening and empathetic response and validated patient emotions. Clinician facilitated processing group on pertinent issues  Therapist Response: Mark [name] is a 59 y.o. male who presents with depression and anxiety symptoms.  Patient arrived within time allowed and reports that he isfeeling "under the clouds."Patient rates his mood at a 5 on a scale of 1-10 with 10 being great. Pt reports he attended a support group, church meeting, and a friend came over. Pt states he struggled to remain level in his social situations, however noticed it did improve his mood. Pt reports struggling with his  family. Pt able to process. Pt engaged in discussion.        Session Time: 10:00-11:00  Participation Level:Active  Behavioral Response:CasualAlertDepressed  Type of Therapy: Group Therapy  Treatment Goals addressed: Coping  Interventions:CBT, DBT, Solution Focused, Supportive and Reframing  Summary:Cln led discussion on trust in relationships. Cln discussed the need for time and measured build-up to establish trust and deeper relationships. Group members shared ways in which this is a struggle for them and issues they have had with expectations in relationships.   Therapist Response: Pt engaged in discussion and reports he struggles with trusting people and remains closed off to most people. Pt is able to process and gain insight.       Session Time: 11:00- 12:00  Participation Level:Active  Behavioral Response:CasualAlertDepressed  Type of Therapy: Group Therapy  Treatment Goals addressed: Coping  Interventions:CBT, DBT, Solution Focused, Supportive and Reframing  Summary:Cln continued topic of boundaries. Cln discussed the different boundary types: rigid, porous, and healthy. Group discussed the ways each boundary type can be helpful or unhelpful in various situations. Group members shared their default boundary type and how it has been helpful or unhelpful for them.   Therapist Response:  Pt engaged in discussion and identifies having mainly rigid boundaries.        Session Time: 12:00 -1:00  Participation Level:Active  Behavioral Response:CasualAlertDepressed  Type of Therapy: Group therapy  Treatment Goals addressed: Coping  Interventions:CBT; Solution focused; Supportive; Reframing  Summary:12:00 - 12:50:Cln continued topic of boundaries. Cln introduced the different categories of boundaries: physical, emotional, intellectual, sexual, material, and time.  boundaries types.  Group discussed struggles they  experience with each category.   12:50 -1:00 Clinician  led check-out. Clinician assessed for immediate needs, medication compliance and efficacy, and safety concerns  Therapist Response:12:00 - 12:50: Pt engaged in discussion and identifies struggling most with emotional boundaries.  At check-out, patient rates hismood at [facility] a scale of 1-10 with 10 being great. Patient reports afternoon plans of working on his to-do list. Patient demonstrates some progress as evidenced by seeking out coping skills when upset. Patient denies SI/HI/self-harm thoughts at the end of group.      Suicidal/Homicidal: Nowithout intent/plan   Plan: Pt will continue in PHP while working to increase stabilization post-hospital discharge, decrease depression and anxiety symptoms, and increase ability to manage symptoms in a healthy manner.   Diagnosis: MDD (major depressive disorder), recurrent severe, without psychosis ([place]) [F33.2]    1. MDD (major depressive disorder), recurrent severe, without psychosis ([place])   2. Generalized anxiety disorder   3. Difficulty coping   4. PTSD (post-traumatic stress disorder)       Lorin Glass, LCSW 05/06/2019

## 2019-05-11 ENCOUNTER — Ambulatory Visit (INDEPENDENT_AMBULATORY_CARE_PROVIDER_SITE_OTHER): Payer: No Typology Code available for payment source | Admitting: Licensed Clinical Social Worker

## 2019-05-11 ENCOUNTER — Other Ambulatory Visit: Payer: Self-pay

## 2019-05-11 ENCOUNTER — Encounter (HOSPITAL_COMMUNITY): Payer: Self-pay | Admitting: Licensed Clinical Social Worker

## 2019-05-11 DIAGNOSIS — F4310 Post-traumatic stress disorder, unspecified: Secondary | ICD-10-CM

## 2019-05-11 DIAGNOSIS — F331 Major depressive disorder, recurrent, moderate: Secondary | ICD-10-CM

## 2019-05-11 DIAGNOSIS — F411 Generalized anxiety disorder: Secondary | ICD-10-CM | POA: Diagnosis not present

## 2019-05-11 NOTE — Psych (Signed)
Virtual Visit via Video Note  I connected with Mark [name] on 04/24/19 at  9:00 AM EDT by a video enabled telemedicine application and verified that I am speaking with the correct person using two identifiers.   I discussed the limitations of evaluation and management by telemedicine and the availability of in person appointments. The patient expressed understanding and agreed to proceed.   I discussed the assessment and treatment plan with the patient. The patient was provided an opportunity to ask questions and all were answered. The patient agreed with the plan and demonstrated an understanding of the instructions.   The patient was advised to call back or seek an in-person evaluation if the symptoms worsen or if the condition fails to improve as anticipated.  Pt was provided 240 minutes of non-face-to-face time during this encounter.   Mark Guiles, LCSW    [facility] PHP THERAPIST PROGRESS NOTE  Mark [name] [id number]  Session Time: 9:00 - 10:00  Participation Level: Active  Behavioral Response: CasualAlertDepressed  Type of Therapy: Group Therapy  Treatment Goals addressed: Coping  Interventions: CBT, DBT, Supportive and Reframing  Summary: Clinician led check-in regarding current stressors and situation, and review of patient completed daily inventory. Clinician utilized active listening and empathetic response and validated patient emotions. Clinician facilitated processing group on pertinent issues  Therapist Response: Mark [name] is a 59 y.o. male who presents with depression and anxiety symptoms.  Patient arrived within time allowed and reports that he isfeeling "determined."Patient rates his mood at a 6.5 on a scale of 1-10 with 10 being great. Pt reports he spent some time in the sun yesterday and was able to complete some tasks however was in a negative head space. Pt reports struggling with communication. Pt able to process. Pt engaged in discussion.         Session Time: 10:00-11:00  Participation Level:Active  Behavioral Response:CasualAlertDepressed  Type of Therapy: Group Therapy  Treatment Goals addressed: Coping  Interventions:CBT, DBT, Solution Focused, Supportive and Reframing  Summary:Cln led discussion on procrastination. Cln discussed two of the possible reasons for procrastination including low motivation/fatigue secondary to depression and anxiety symptoms and anxiety/worry about dealing with an aspect of the task you are procrastinating. Cln encouraged group members to consider the underlying issue and addressing them if applicable to an instance of procrastination. Group members shared ways they connect with these two reasons as well as tasks they are procrastinating due to one of these reasons. Group members provided support and processed issues brought up.    Therapist Response: Pt engaged in discussion and reports procrastination is an issue for them. Pt brainstormed with group about ways to handle their behaviors.       Session Time: 11:00- 12:00  Participation Level:Active  Behavioral Response:CasualAlertDepressed  Type of Therapy: Group Therapy  Treatment Goals addressed: Coping  Interventions:CBT, DBT, Solution Focused, Supportive and Reframing  Summary:Cln introduced topic of fear and how it motivated and affects [name]. Group viewed TED talk "why to define your fears instead of goals." Cln discussed how to apply decision making model presented in talk. Group utilized example to walk through the model together.  Therapist Response: Pt engaged in discussion and activity. Pt reports willingness to utilize decision making model.       Session Time: 12:00 -1:00  Participation Level:Active  Behavioral Response:CasualAlertDepressed  Type of Therapy: Group therapy  Treatment Goals addressed: Coping  Interventions:CBT; Solution focused; Supportive;  Reframing  Summary:12:00 - 12:50: Cln introduced concept of stoicism: defining what  you can and cannot control and focusing on what you can control. Group discussed frustrations and barriers with facing things they cannot control and how practicing stoicism can help. 12:50 -1:00 Clinician led check-out. Clinician assessed for immediate needs, medication compliance and efficacy, and safety concerns  Therapist Response:12:00 - 12:50: Pt engaged in discussion and reports frequent struggles with things he cannot control. At check-out, patient rates hismood at [facility] a scale of 1-10 with 10 being great. Patient reports afternoon plans of working on his to do list. Patient demonstrates some progress as evidenced by continued efforts to stay in recovery. Patient denies SI/HI/self-harm thoughts at the end of group.      Suicidal/Homicidal: Nowithout intent/plan   Plan: Pt will continue in PHP while working to increase stabilization post-hospital discharge, decrease depression and anxiety symptoms, and increase ability to manage symptoms in a healthy manner.   Diagnosis: MDD (major depressive disorder), recurrent severe, without psychosis ([place]) [F33.2]    1. MDD (major depressive disorder), recurrent severe, without psychosis ([place])   2. Generalized anxiety disorder   3. PTSD (post-traumatic stress disorder)       Lorin Glass, LCSW 05/11/2019

## 2019-05-11 NOTE — Progress Notes (Signed)
Virtual Visit via Video Note  I connected with Mark [name] on 05/11/19 at 6:00 pm EDT by a video enabled telemedicine application and verified that I am speaking with the correct person using two identifiers.   I discussed the limitations of evaluation and management by telemedicine and the availability of in person appointments. The patient expressed understanding and agreed to proceed.  History of Present Illness: [facility], [facility].    Observations/Objective: Patient participated in a discussion on Thinking Errors, faulty patterns of thinking that are self-defeating. Patients reports using thinking errors when dealing with the unjust treatment by the [place]. Encouraged patient on his thoughts not matching up with reality, aka cognitive distortions.   Assessment and Plan: Counselor will continue to meet with patient to address treatment plan goals. Patient will continue to follow recommendations of providers and implement skills learned in session.   Follow Up Instructions: I discussed the assessment and treatment plan with the patient. The patient was provided an opportunity to ask questions and all were answered. The patient agreed with the plan and demonstrated an understanding of the instructions.   The patient was advised to call back or seek an in-person evaluation if the symptoms worsen or if the condition fails to improve as anticipated.  I provided 60 minutes of non-face-to-face time during this encounter.   Jalisa Sacco S, LCAS

## 2019-05-11 NOTE — Psych (Signed)
Virtual Visit via Video Note  I connected with Mark [name] on 04/23/19 at  9:00 AM EDT by a video enabled telemedicine application and verified that I am speaking with the correct person using two identifiers.   I discussed the limitations of evaluation and management by telemedicine and the availability of in person appointments. The patient expressed understanding and agreed to proceed.   I discussed the assessment and treatment plan with the patient. The patient was provided an opportunity to ask questions and all were answered. The patient agreed with the plan and demonstrated an understanding of the instructions.   The patient was advised to call back or seek an in-person evaluation if the symptoms worsen or if the condition fails to improve as anticipated.  Pt was provided 240 minutes of non-face-to-face time during this encounter.   Mark Guiles, LCSW    [facility] PHP THERAPIST PROGRESS NOTE  Mark [name] [id number]  Session Time: 9:00 - 10:00  Participation Level: Active  Behavioral Response: CasualAlertDepressed  Type of Therapy: Group Therapy  Treatment Goals addressed: Coping  Interventions: CBT, DBT, Supportive and Reframing  Summary: Clinician led check-in regarding current stressors and situation, and review of patient completed daily inventory. Clinician utilized active listening and empathetic response and validated patient emotions. Clinician facilitated processing group on pertinent issues  Therapist Response: Mark [name] is a 59 y.o. male who presents with depression and anxiety symptoms.  Patient arrived within time allowed and reports that he isfeeling "anxious."Patient rates his mood at a 3 on a scale of 1-10 with 10 being great. Pt reports he overslept yesterday and was anxious due to missing group. Pt states he was in pain and struggled with utilizing coping skills. Pt able to process. Pt engaged in discussion.        Session Time:  10:00-11:00  Participation Level:Active  Behavioral Response:CasualAlertDepressed  Type of Therapy:Group Therapy  Treatment Goals addressed: Coping  Interventions:CBT, DBT, Solution Focused, Supportive and Reframing  Summary:Cln led discussion on mindfulness. Cln introduced the "what" and "how" skills of mindfulness and how mindfulness can aid our mental health. Cln connected mindfulness to increased ability to utilize distress tolerance skills and cognitive distortions. Group problem-solved struggles with the concept of mindfulness.   Therapist Response: Pt engaged in discussion and reports understanding of mindfulness. Pt states mindfulness could be helpful to help keep him out of his feelings.      Session Time: 11:00- 12:00  Participation Level:Active  Behavioral Response:CasualAlertDepressed  Type of Therapy:Group Therapy  Treatment Goals addressed: Coping  Interventions:CBT, DBT, Solution Focused, Supportive and Reframing  Summary:Cln continued topic of boundaries. Group utilized hand-out "How to set a boundary" to discuss process of setting healthy boundaries. Group discussed how to problem solve common boundary issues.  Therapist Response: Pt engaged in discussion and reports understanding of how to set a boundary. Pt reports struggling with reinforcing boundaries after setting them when it comes to people closest to him.       Session Time: 12:00 -1:00  Participation Level:Active  Behavioral Response:CasualAlertDepressed  Type of Therapy:Group therapy  Treatment Goals addressed: Coping  Interventions:CBT; Solution focused; Supportive; Reframing  Summary:12:00 - 12:50:Cln continued topic of boundaries. Cln led group in a boundary workshop in which group members shared current boundary issues and group worked together to address issues utilizing boundary tenets discussed previously.  12:50 -1:00 Clinician  led check-out. Clinician assessed for immediate needs, medication compliance and efficacy, and safety concerns  Therapist Response:12:00 - 12:50: Pt engaged in activity  and discussion. Pt demonstrates understanding of how to set and enforce boundaries throughout activity.   At check-out, patient rates hismood at [facility] a scale of 1-10 with 10 being great. Patient reports afternoon plans of cleaning up. Patient demonstrates some progress as evidenced by trying to prevent negative consequences during his bad mood. Patient denies SI/HI/self-harm thoughts at the end of group.      Suicidal/Homicidal: Nowithout intent/plan   Plan: Pt will continue in PHP while working to increase stabilization post-hospital discharge, decrease depression and anxiety symptoms, and increase ability to manage symptoms in a healthy manner.   Diagnosis: MDD (major depressive disorder), recurrent severe, without psychosis (Mark [name]) [F33.2]    1. MDD (major depressive disorder), recurrent severe, without psychosis (Mark [name])   2. Generalized anxiety disorder   3. PTSD (post-traumatic stress disorder)       Mark Glass, LCSW 05/11/2019

## 2019-05-12 ENCOUNTER — Encounter (HOSPITAL_COMMUNITY): Payer: Self-pay | Admitting: Licensed Clinical Social Worker

## 2019-05-12 ENCOUNTER — Other Ambulatory Visit: Payer: Self-pay

## 2019-05-12 ENCOUNTER — Ambulatory Visit (INDEPENDENT_AMBULATORY_CARE_PROVIDER_SITE_OTHER): Payer: No Typology Code available for payment source | Admitting: Licensed Clinical Social Worker

## 2019-05-12 DIAGNOSIS — F4310 Post-traumatic stress disorder, unspecified: Secondary | ICD-10-CM

## 2019-05-12 DIAGNOSIS — F411 Generalized anxiety disorder: Secondary | ICD-10-CM

## 2019-05-12 DIAGNOSIS — F331 Major depressive disorder, recurrent, moderate: Secondary | ICD-10-CM | POA: Diagnosis not present

## 2019-05-12 NOTE — Progress Notes (Signed)
Virtual Visit via Video Note  I connected with Mark [name] on 05/12/19 at 11:00am EDT by video enabled telemedicine with the correct person using two identifiers.   I discussed the limitations of evaluation and management by telemedicine and the availability of in person appointments. The patient expressed understanding and agreed to proceed.  History of Present Illness: Pt was referred to [facility] OP therapy for PTSD, depression and anxiety by the [facility]     Observations/Objective: Verbalize positive and hopeful feelings of the future.  Patient presents anxious. He discussed his psychiatric symptoms and current life events. Reviewed tx plan with patient who verbalized acceptance of the plan.Patient discussed his current stressors: Family, $, health issues. Used CBT to assist patient with challenging and changing his cognitive distortions and behaviors, improving his emotional regulation, and help him develop personal coping strategies that target solving current problems.    Assessment and Plan: Counselor will continue to meet with patient to address treatment plan goals. Patient will continue to follow recommendations of providers and implement skills learned in session.  Follow Up Instructions: Counselor will send webex invitation weekly before each individual and group session.   I discussed the assessment and treatment plan with the patient. The patient was provided an opportunity to ask questions and all were answered. The patient agreed with the plan and demonstrated an understanding of the instructions.   The patient was advised to call back or seek an in-person evaluation if the symptoms worsen or if the condition fails to improve as anticipated.  I provided 60 minutes of non-face-to-face time during this encounter.   Mark [name] S, LCAS  .

## 2019-05-13 NOTE — Psych (Signed)
Virtual Visit via Video Note  I connected with Mark [name] on 04/27/19 at  9:00 AM EDT by a video enabled telemedicine application and verified that I am speaking with the correct person using two identifiers.   I discussed the limitations of evaluation and management by telemedicine and the availability of in person appointments. The patient expressed understanding and agreed to proceed.   I discussed the assessment and treatment plan with the patient. The patient was provided an opportunity to ask questions and all were answered. The patient agreed with the plan and demonstrated an understanding of the instructions.   The patient was advised to call back or seek an in-person evaluation if the symptoms worsen or if the condition fails to improve as anticipated.  Pt was provided 200 minutes of non-face-to-face time during this encounter.   Lorin Glass, LCSW    [facility] PHP THERAPIST PROGRESS NOTE  Mark [name] [id number]  Session Time: 9:00 - 10:00  Participation Level: Active  Behavioral Response: CasualAlertDepressed  Type of Therapy: Group Therapy  Treatment Goals addressed: Coping  Interventions: CBT, DBT, Supportive and Reframing  Summary: Clinician led check-in regarding current stressors and situation, and review of patient completed daily inventory. Clinician utilized active listening and empathetic response and validated patient emotions. Clinician facilitated processing group on pertinent issues  Therapist Response: Mark [name] is a 59 y.o. male who presents with depression and anxiety symptoms.  Patient arrived within time allowed and reports that he isfeeling "over it."Patient rates his mood at a 4.5 on a scale of 1-10 with 10 being great. Pt reports his Saturday went well and he saw his nephew's family however Sunday he struggled to sleep, had increased anxiety, and "didn't do anything." Pt reports struggling with managing bad moods. Pt able to process. Pt  engaged in discussion.      Session Time: 10:00-11:00  Participation Level:Active  Behavioral Response:CasualAlertDepressed  Type of Therapy:Group Therapy  Treatment Goals addressed: Coping  Interventions:CBT, DBT, Solution Focused, Supportive and Reframing  Summary:Cln led discussion on struggles with support system wanting to "fix" things. Group discussed frustrations and guilt surrounding these issues and feeling invalidated and unheard. Cln encouraged group members to ask for what they need from support persons and communicating when they are being unhelpful. Group brainstormed ways to address these concerns and overcome possible barriers.   Therapist Response:  Pt engaged in discussion and reports feeling that he cannot share what's going on with his wife due to feeling invalidated when he does.      Session Time: 11:00- 12:00  Participation Level:Active  Behavioral Response:CasualAlertDepressed  Type of Therapy:Group Therapy  Treatment Goals addressed: Coping  Interventions:CBT, DBT, Solution Focused, Supportive and Reframing  Summary:Cln introduced topic of the five love languages. Cln led discussion on how understanding the concept of love languages can aid in communication, reframing, and having your needs met. Group took a five love languages quiz to determine their love language and discussed the results and how they felt it presented in their lives. Group discussed ways to apply this knowledge in their current relationships.   Therapist Response: Pt engaged in discussion and activity. Pt able to identify ways in which understanding of their love language can improve communication and understanding in their relationships.        Session Time: 12:00 -1:00  Participation Level:Active  Behavioral Response:CasualAlertDepressed  Type of Therapy:Group therapy  Treatment Goals addressed:  Coping  Interventions:CBT; Solution focused; Supportive; Reframing  Summary:12:00 - 12:50: Cln led discussion  on conflict in relationships. Cln utilized handout "Relationship Conflict Resolution" to aid discussion. Group members shared ways they handle issues in relationships and how they can incorporate healthy communication skills to address the issues.  12:50 -1:00 Clinician led check-out. Clinician assessed for immediate needs, medication compliance and efficacy, and safety concerns  Therapist Response:12:00 - 12:20: Pt engaged in discussion and reports issues with shutting down and anger.  *Pt chose to leave group at 12:20 reporting he had a migraine and needed to lay down.       Suicidal/Homicidal: Nowithout intent/plan   Plan: Pt will continue in PHP while working to increase stabilization post-hospital discharge, decrease depression and anxiety symptoms, and increase ability to manage symptoms in a healthy manner.   Diagnosis: MDD (major depressive disorder), recurrent severe, without psychosis ([place]) [F33.2]    1. MDD (major depressive disorder), recurrent severe, without psychosis ([place])   2. Generalized anxiety disorder   3. PTSD (post-traumatic stress disorder)       Lorin Glass, LCSW 05/13/2019

## 2019-05-14 ENCOUNTER — Telehealth (INDEPENDENT_AMBULATORY_CARE_PROVIDER_SITE_OTHER): Payer: No Typology Code available for payment source | Admitting: Psychiatry

## 2019-05-14 ENCOUNTER — Encounter (HOSPITAL_COMMUNITY): Payer: Self-pay | Admitting: Psychiatry

## 2019-05-14 ENCOUNTER — Other Ambulatory Visit: Payer: Self-pay

## 2019-05-14 VITALS — Wt 275.0 lb

## 2019-05-14 DIAGNOSIS — F4310 Post-traumatic stress disorder, unspecified: Secondary | ICD-10-CM | POA: Diagnosis not present

## 2019-05-14 DIAGNOSIS — F419 Anxiety disorder, unspecified: Secondary | ICD-10-CM | POA: Diagnosis not present

## 2019-05-14 DIAGNOSIS — F331 Major depressive disorder, recurrent, moderate: Secondary | ICD-10-CM

## 2019-05-14 MED ORDER — ARIPIPRAZOLE 5 MG PO TABS: 5.0000 mg | ORAL_TABLET | Freq: Every day | ORAL | 1 refills | Status: DC

## 2019-05-14 MED ORDER — PRAZOSIN HCL 2 MG PO CAPS: 2.0000 mg | ORAL_CAPSULE | Freq: Every day | ORAL | 0 refills | Status: DC

## 2019-05-14 MED ORDER — BUPROPION HCL ER (XL) 300 MG PO TB24: 300.0000 mg | ORAL_TABLET | Freq: Every day | ORAL | 0 refills | Status: DC

## 2019-05-14 MED ORDER — BUSPIRONE HCL 10 MG PO TABS: 10.0000 mg | ORAL_TABLET | Freq: Three times a day (TID) | ORAL | 1 refills | Status: DC

## 2019-05-14 NOTE — Progress Notes (Signed)
Virtual Visit via Telephone Note  I connected with Mark [name] on 05/14/19 at  2:00 PM EDT by telephone and verified that I am speaking with the correct person using two identifiers.   I discussed the limitations, risks, security and privacy concerns of performing an evaluation and management service by telephone and the availability of in person appointments. I also discussed with the patient that there may be a patient responsible charge related to this service. The patient expressed understanding and agreed to proceed.   History of Present Illness: Patient is evaluated by phone session.  He was last seen in October 2020.  Patient told he was admitted in November around Thanksgiving because having severe depression and could not function.  He started his treatment at [facility] and his psychiatrist recommended inpatient where he stayed for 2 weeks.  His medicines are changed.  He is no longer taking Lamictal.  Now he is taking Lexapro, trazodone, Minipress, BuSpar, hydroxyzine, Wellbutrin.  He feels his anxiety is still high and sometimes he had a struggle to keep himself focused and attentive.  He is not sure if he has ADD.  He also did PHP and currently still in therapy with Beth.  Like to come back to [name] for medication management.  Is trying to be more cautious about his physical health.  He lost weight since paying more attention to his diet.  He also continues to reconcile with his wife and thinking about marriage counseling.  Live by himself but he realized he needs some support.  He denies any paranoia or any hallucination but admitted lack of motivation, feeling tired, ruminative thoughts.  He denies any suicidal thoughts.  He is also taking Provigil because sometimes he feels so groggy in the morning that needs something.  Wake him up.  He is in process of changing his mask for his CPAP machine.  Past Psychiatric History:Reviewed. H/O depression and PTSD. H/O inpatient at [facility] in [place] 2020. No  suicidal attempt.  Prozac and Effexor worked for while. Took Cymbalta, gabapentin, Lamictal, lexapro and hydroxyzine in past . H/O multiple PHP.    Psychiatric Specialty Exam: Physical Exam  Review of Systems  Weight 275 lb (124.7 kg).There is no height or weight on file to calculate BMI.  General Appearance: NA  Eye Contact:  NA  Speech:  Clear and Coherent and Slow  Volume:  Decreased  Mood:  Anxious and Dysphoric  Affect:  NA  Thought Process:  Goal Directed  Orientation:  Full (Time, Place, and Person)  Thought Content:  Rumination  Suicidal Thoughts:  No  Homicidal Thoughts:  No  Memory:  Immediate;   Good Recent;   Good Remote;   Fair  Judgement:  Intact  Insight:  Fair  Psychomotor Activity:  NA  Concentration:  Concentration: Fair and Attention Span: Fair  Recall:  Good  Fund of Knowledge:  Fair  Language:  Good  Akathisia:  No  Handed:  Right  AIMS (if indicated):     Assets:  Communication Skills Desire for Improvement Housing Resilience  ADL's:  Intact  Cognition:  WNL  Sleep:   groggy      Assessment and Plan: Major depressive disorder, recurrent.  PTSD.  Anxiety.  Patient likes to come back to [name] for medication management.  He is taking multiple medication and having issues with focus and attention.  Despite taking multiple medication he continues to struggle with depression and anxiety.  He still have sometimes nightmares.  I discussed polypharmacy.  I recommend not to take trazodone since together with trazodone and Minipress making him groggy the next day he is taking Provigil to help his waking up.  I recommend to try Abilify to help his anxiety and depression since he had never tried before.  We will start Abilify 5 mg and he will take half tablet for 1 week and then full tablet daily.  He will reduce Lexapro to 10 mg for 1 week and then stop.  We will continue Wellbutrin XL 300 mg daily, increase BuSpar 10 mg 3 times a day to help anxiety, continue  Minipress 2 mg at bedtime to help nightmares and flashback.  Encouraged to continue therapy with Charolotte Eke.  Recommended to call us back if is any question of any concern.  Follow-up in 4 weeks.  Time spent 25 minutes.  We will also get discharge summary from recent hospitalization at [facility].    Follow Up Instructions:    I discussed the assessment and treatment plan with the patient. The patient was provided an opportunity to ask questions and all were answered. The patient agreed with the plan and demonstrated an understanding of the instructions.   The patient was advised to call back or seek an in-person evaluation if the symptoms worsen or if the condition fails to improve as anticipated.  I provided 30 minutes of non-face-to-face time during this encounter.   Kathlee Nations, MD

## 2019-05-16 NOTE — Psych (Signed)
Virtual Visit via Video Note  I connected with Mark [name] on 04/29/19 at  9:00 AM EDT by a video enabled telemedicine application and verified that I am speaking with the correct person using two identifiers.   I discussed the limitations of evaluation and management by telemedicine and the availability of in person appointments. The patient expressed understanding and agreed to proceed.   I discussed the assessment and treatment plan with the patient. The patient was provided an opportunity to ask questions and all were answered. The patient agreed with the plan and demonstrated an understanding of the instructions.   The patient was advised to call back or seek an in-person evaluation if the symptoms worsen or if the condition fails to improve as anticipated.  Pt was provided 240 minutes of non-face-to-face time during this encounter.   Mark Guiles, LCSW    [facility] PHP THERAPIST PROGRESS NOTE  Mark [name] [id number]  Session Time: 9:00 - 10:00  Participation Level: Active  Behavioral Response: CasualAlertDepressed  Type of Therapy: Group Therapy  Treatment Goals addressed: Coping  Interventions: CBT, DBT, Supportive and Reframing  Summary: Clinician led check-in regarding current stressors and situation, and review of patient completed daily inventory. Clinician utilized active listening and empathetic response and validated patient emotions. Clinician facilitated processing group on pertinent issues  Therapist Response: Mark [name] is a 59 y.o. male who presents with depression and anxiety symptoms.  Patient arrived within time allowed and reports that he isfeeling "tired."Patient rates his mood at a 8 on a scale of 1-10 with 10 being great. Pt reports his afternoon went well and he cooked dinner, attended [employer], and called his nephew. Pt reports struggling with sleep due to physical issues. Pt able to process. Pt engaged in discussion.       Session  Time: 10:00 -11:00  Participation Level:Active  Behavioral Response:CasualAlertDepressed  Type of Therapy: Group Therapy, psychoeducation, psychotherapy  Treatment Goals addressed: Coping  Interventions:CBT, DBT, Solution Focused, Supportive and Reframing  Summary:Cln led discussion on procrastination. Group members discussed ways in which procrastination is an issue for them. Cln and group brainstormed ways to address procrastination including utilizing positive or negative reinforcement, using an accountability partner, time management tips, and addressing underlying concern behind what task is being put off.   Therapist Response:  Pt engaged in discussion and reports procrastination is a struggle for him. Pt able to process and reports willingness to try time management to address procrastination.       Session Time: 11:00 -12:00  Participation Level:Active  Behavioral Response:CasualAlertDepressed  Type of Therapy: Group Therapy, psychotherapy  Treatment Goals addressed: Coping  Interventions:Strengths based, reframing, Supportive,   Summary:Spiritual Care group  Therapist Response: Patient engaged in group. See chaplain note.        Session Time: 12:00- 1:00  Participation Level:Active  Behavioral Response:CasualAlertDepressed  Type of Therapy: Group Therapy, Psychoeducation  Treatment Goals addressed: Coping  Interventions:relaxation training; Supportive; Reframing  Summary:12:00 - 12:50: Relaxation group: Cln led group focused on retraining the body's response to stress.  12:50 -1:00 Clinician led check-out. Clinician assessed for immediate needs, medication compliance and efficacy, and safety concerns  Therapist Response:Pt engaged in activity and discussion.  At check-out, patient rates hismood at [facility] a scale of 1-10 with 10 being great. Patient reports afternoon plans of watching a church service,  going to bible study, and work on his to-do list. Patient demonstrates some progress as evidenced by continued efforts to extend kindness to himself. Patient denies  SI/HI/self-harm thoughts at the end of group.      Suicidal/Homicidal: Nowithout intent/plan   Plan: Pt will continue in PHP while working to increase stabilization post-hospital discharge, decrease depression and anxiety symptoms, and increase ability to manage symptoms in a healthy manner.   Diagnosis: MDD (major depressive disorder), recurrent severe, without psychosis ([place]) [F33.2]    1. MDD (major depressive disorder), recurrent severe, without psychosis ([place])   2. Generalized anxiety disorder   3. PTSD (post-traumatic stress disorder)       Mark Glass, LCSW 05/16/2019

## 2019-05-16 NOTE — Psych (Signed)
Virtual Visit via Video Note  I connected with Mark [name] on 04/28/19 at  9:00 AM EDT by a video enabled telemedicine application and verified that I am speaking with the correct person using two identifiers.   I discussed the limitations of evaluation and management by telemedicine and the availability of in person appointments. The patient expressed understanding and agreed to proceed.   I discussed the assessment and treatment plan with the patient. The patient was provided an opportunity to ask questions and all were answered. The patient agreed with the plan and demonstrated an understanding of the instructions.   The patient was advised to call back or seek an in-person evaluation if the symptoms worsen or if the condition fails to improve as anticipated.  Pt was provided 240 minutes of non-face-to-face time during this encounter.   [name], LCSW    [facility] PHP THERAPIST PROGRESS NOTE  Mark [name] [id number]  Session Time: 9:00 - 10:00  Participation Level: Active  Behavioral Response: CasualAlertDepressed  Type of Therapy: Group Therapy  Treatment Goals addressed: Coping  Interventions: CBT, DBT, Supportive and Reframing  Summary: Clinician led check-in regarding current stressors and situation, and review of patient completed daily inventory. Clinician utilized active listening and empathetic response and validated patient emotions. Clinician facilitated processing group on pertinent issues  Therapist Response: Mark [name] is a 59 y.o. male who presents with depression and anxiety symptoms.  Patient arrived within time allowed and reports that he isfeeling "optimistic."Patient rates his mood at a 7 on a scale of 1-10 with 10 being great. Pt reports he had a headache most of the day that he believes was triggered by his anxiety and it also made him nauseous. Pt states she saw his PCP as well. Pt reports struggling with balance. Pt able to process. Pt engaged in  discussion.        Session Time: 10:00-11:00  Participation Level:Active  Behavioral Response:CasualAlertDepressed  Type of Therapy:Group Therapy  Treatment Goals addressed: Coping  Interventions:CBT, DBT, Solution Focused, Supportive and Reframing  Summary:Cln led discussion on emotional regulation. Cln discussed how awareness of triggers and warning signs can aid in increasing ability to manage big emotions by catching them early. Cln encouraged pt's to utilize distraction skills as soon as they recognize a feeling is problematic for them.   Therapist Response:  Pt engaged in discussion and reports struggling with identifying his feeling early enough. Pt is able to process and identify one way to work on their emotional regulation.       Session Time: 11:00- 12:00  Participation Level:Active  Behavioral Response:CasualAlertDepressed  Type of Therapy: Group Therapy, OT  Treatment Goals addressed: Coping  Interventions:Psychosocial skills training, Supportive  Summary:Occupational Therapy group  Therapist Response:Patient engaged in group. See OT note.       Session Time: 12:00 -1:00  Participation Level:Active  Behavioral Response:CasualAlertDepressed  Type of Therapy:Group therapy  Treatment Goals addressed: Coping  Interventions:CBT; Solution focused; Supportive; Reframing  Summary:12:00 - 12:50: Cln introduced topic of healthy relationships. Group created list of traits they felt are important in a healthy relationships and discussed why they valued those traits. Cln presented honesty, trust, and respect as the foundation to a healthy relationship. Group members shared ways in which their current relationships stack up. Cln encouraged group members to create their own list of the most important traits and to seek to exhibit those traits in order to attract them in others.  12:50 -1:00 Clinician led  check-out. Clinician assessed for  immediate needs, medication compliance and efficacy, and safety concerns  Therapist Response:12:00 - 12:50:Pt engaged in discussion and activity. Pt reports his relationships are moderately healthy and is able to identify ways to improve them. At check-out, patient rates hismood at [facility] a scale of 1-10 with 10 being great. Patient reports afternoon plans of relaxing. Patient demonstrates some progress as evidenced by setting boundaries to increase his wellness. Patient denies SI/HI/self-harm thoughts at the end of group.      Suicidal/Homicidal: Nowithout intent/plan   Plan: Pt will continue in PHP while working to increase stabilization post-hospital discharge, decrease depression and anxiety symptoms, and increase ability to manage symptoms in a healthy manner.   Diagnosis: MDD (major depressive disorder), recurrent severe, without psychosis ([place]) [F33.2]    1. MDD (major depressive disorder), recurrent severe, without psychosis ([place])   2. Generalized anxiety disorder   3. PTSD (post-traumatic stress disorder)       Mark Glass, LCSW 05/16/2019

## 2019-05-16 NOTE — Psych (Signed)
Virtual Visit via Video Note  I connected with Mark [name] on 04/30/19 at  9:00 AM EDT by a video enabled telemedicine application and verified that I am speaking with the correct person using two identifiers.   I discussed the limitations of evaluation and management by telemedicine and the availability of in person appointments. The patient expressed understanding and agreed to proceed.   I discussed the assessment and treatment plan with the patient. The patient was provided an opportunity to ask questions and all were answered. The patient agreed with the plan and demonstrated an understanding of the instructions.   The patient was advised to call back or seek an in-person evaluation if the symptoms worsen or if the condition fails to improve as anticipated.  Pt was provided 240 minutes of non-face-to-face time during this encounter.   Lorin Glass, LCSW    [facility] PHP THERAPIST PROGRESS NOTE  Mark [name] [id number]  Session Time: 9:00 - 10:00  Participation Level: Active  Behavioral Response: CasualAlertDepressed  Type of Therapy: Group Therapy  Treatment Goals addressed: Coping  Interventions: CBT, DBT, Supportive and Reframing  Summary: Clinician led check-in regarding current stressors and situation, and review of patient completed daily inventory. Clinician utilized active listening and empathetic response and validated patient emotions. Clinician facilitated processing group on pertinent issues  Therapist Response: Mark [name] is a 59 y.o. male who presents with depression and anxiety symptoms.  Patient arrived within time allowed and reports that he isfeeling "frustrated."Patient rates his mood at a 4.5 on a scale of 1-10 with 10 being great. Pt reports yesterday "went downhill" and that he felt "disgusted" due to bureaucratic issues. Pt states he feels like he is in a "precarious" place where he is managing well but may not be if one more thing happens. Pt  reports struggling with quitting cigarettes. Pt able to process. Pt engaged in discussion.       Session Time: 10:00-11:00  Participation Level:Active  Behavioral Response:CasualAlertDepressed  Type of Therapy:Group Therapy  Treatment Goals addressed: Coping  Interventions:CBT, DBT, Solution Focused, Supportive and Reframing  Summary:Cln introduced topic of feelings. Cln separated feelings and our reactions to feelings and discussed the differences between the two. Group worked to decipher situations to determine whether feelings or reactions are at play and how knowing the difference can reframe our perceptions and motivations.    Therapist Response: Pt engaged in discussion. Pt reports understanding the concept and struggling to know how to apply it.        Session Time: 11:00- 12:00  Participation Level:Active  Behavioral Response:CasualAlertDepressed  Type of Therapy:Group Therapy  Treatment Goals addressed: Coping  Interventions:CBT, DBT, Solution Focused, Supportive and Reframing  Summary:Cln continued topic of feelings and introduced concept of emotional reasoning. Cln discussed the need to determine whether a feeling is a founded or unfounded feeling. Group worked through examples to Network engineer determining if a feeling is founded or unfounded and how to respond in either scenario.  Therapist Response:  Pt engaged in discussion and activity. Pt reports understanding of feelings not equaling fact and demonstrates this in practice.        Session Time: 12:00 -1:00  Participation Level:Active  Behavioral Response:CasualAlertDepressed  Type of Therapy:Group therapy  Treatment Goals addressed: Coping  Interventions:CBT; Solution focused; Supportive; Reframing  Summary:12:00 - 12:50: Cln continued topic of feelings. Group viewed TED talk "the power of vulnerability" and discussed ways in which feelings  typically viewed as "negative" such as guilt, shame, and vulnerability can serve  important purposes. Group discussed ways in which vulnerability is difficult for them.  12:50 -1:00 Clinician led check-out. Clinician assessed for immediate needs, medication compliance and efficacy, and safety concerns  Therapist Response:12:00 - 12:50: Pt engaged in discussion and reports vulnerability is diffilcult for him due to struggles with trust.  At check-out, patient rates hismood at a6.5on a scale of 1-10 with 10 being great. Patient reports afternoon plans of taking a nap and cooking dinner. Patient demonstrates some progress as evidenced by taking future oriented steps. Patient denies SI/HI/self-harm thoughts at the end of group.      Suicidal/Homicidal: Nowithout intent/plan   Plan: Pt will continue in PHP while working to increase stabilization post-hospital discharge, decrease depression and anxiety symptoms, and increase ability to manage symptoms in a healthy manner.   Diagnosis: MDD (major depressive disorder), recurrent severe, without psychosis ([facility]) [F33.2]    1. MDD (major depressive disorder), recurrent severe, without psychosis ([facility])   2. Generalized anxiety disorder   3. PTSD (post-traumatic stress disorder)       [name], LCSW 05/16/2019

## 2019-05-18 ENCOUNTER — Other Ambulatory Visit: Payer: Self-pay

## 2019-05-18 ENCOUNTER — Encounter (HOSPITAL_COMMUNITY): Payer: Self-pay | Admitting: Licensed Clinical Social Worker

## 2019-05-18 ENCOUNTER — Ambulatory Visit (INDEPENDENT_AMBULATORY_CARE_PROVIDER_SITE_OTHER): Payer: No Typology Code available for payment source | Admitting: Licensed Clinical Social Worker

## 2019-05-18 DIAGNOSIS — F4310 Post-traumatic stress disorder, unspecified: Secondary | ICD-10-CM | POA: Diagnosis not present

## 2019-05-18 DIAGNOSIS — F419 Anxiety disorder, unspecified: Secondary | ICD-10-CM | POA: Diagnosis not present

## 2019-05-18 DIAGNOSIS — F331 Major depressive disorder, recurrent, moderate: Secondary | ICD-10-CM

## 2019-05-18 NOTE — Progress Notes (Signed)
Virtual Visit via Video Note  I connected with Mark [name] on 05/18/19 at 5:00 pm EDT by a video enabled telemedicine application and verified that I am speaking with the correct person using two identifiers.   I discussed the limitations of evaluation and management by telemedicine and the availability of in person appointments. The patient expressed understanding and agreed to proceed.  History of Present Illness: [facility]    Observations/Objective: Patient participated in a discussion on change, which may mean letting go of dysfunctional relationship patterns, irrational beliefs and self-sabotaging behaviors and then replacing them with more positive beliefs and relationship patterns. Patient was encouraged to embrace change.     Assessment and Plan: Counselor will continue to meet with patient to address treatment plan goals. Patient will continue to follow recommendations of providers and implement skills learned in session.   Follow Up Instructions: I discussed the assessment and treatment plan with the patient. The patient was provided an opportunity to ask questions and all were answered. The patient agreed with the plan and demonstrated an understanding of the instructions.   The patient was advised to call back or seek an in-person evaluation if the symptoms worsen or if the condition fails to improve as anticipated.  I provided 60 minutes of non-face-to-face time during this encounter.   Leny Morozov S, LCAS

## 2019-05-19 ENCOUNTER — Telehealth (HOSPITAL_COMMUNITY): Payer: Self-pay | Admitting: Psychiatry

## 2019-05-19 NOTE — Telephone Encounter (Signed)
D:  Pt's therapist [facility] Thea Silversmith, LCAS) referred pt to MH-IOP.  Case manager was awaiting authorization from the [place].  According to Caryn Section, extended the auth to include MH-IOP.  It will expire 09-27-19.  Auth # [id number].  A:  Placed call to orient and give pt a start date, but there was no answer.  Left vm requesting patient to call case manager.

## 2019-05-20 ENCOUNTER — Ambulatory Visit (INDEPENDENT_AMBULATORY_CARE_PROVIDER_SITE_OTHER): Payer: No Typology Code available for payment source | Admitting: Licensed Clinical Social Worker

## 2019-05-20 ENCOUNTER — Encounter (HOSPITAL_COMMUNITY): Payer: Self-pay | Admitting: Licensed Clinical Social Worker

## 2019-05-20 ENCOUNTER — Other Ambulatory Visit: Payer: Self-pay

## 2019-05-20 ENCOUNTER — Telehealth (HOSPITAL_COMMUNITY): Payer: Self-pay | Admitting: Psychiatry

## 2019-05-20 DIAGNOSIS — F331 Major depressive disorder, recurrent, moderate: Secondary | ICD-10-CM

## 2019-05-20 DIAGNOSIS — F4310 Post-traumatic stress disorder, unspecified: Secondary | ICD-10-CM | POA: Diagnosis not present

## 2019-05-20 DIAGNOSIS — F419 Anxiety disorder, unspecified: Secondary | ICD-10-CM | POA: Diagnosis not present

## 2019-05-20 NOTE — Telephone Encounter (Signed)
D:  Pt phoned to inquire about MH-IOP.  A:  Oriented pt to MH-IOP.  Pt is requesting to start as soon as possible.  States he will be having medical procedure tomorrow.  Pt will start 05-26-19 @ 9 a.m.  R:  Patient receptive.

## 2019-05-20 NOTE — Progress Notes (Signed)
Virtual Visit via Video Note  I connected with Mark [name] on 05/20/19 at 11:00am EDT by video enabled telemedicine with the correct person using two identifiers.   I discussed the limitations of evaluation and management by telemedicine and the availability of in person appointments. The patient expressed understanding and agreed to proceed.  History of Present Illness: Pt was referred to [facility] OP therapy for PTSD, depression and anxiety by the [facility].     Observations/Objective: Verbalize positive and hopeful feelings of the future.  Patient presents depressed. He discussed his psychiatric symptoms and current life events. Patient discussed his current stressors: Family, $, health issues. ""I didn't sleep at all last night, I got in a fight with my family." Cln provided psychoeducation on boundaries especially with family. Role played using boundaries with his wife, son and daughter. Patient is having surgery tomorrow for an open sore on his backside. He will be in the hospital for 4 days. Assisted patient with compartmentalizing his needs, asking for help while he is gone. Patient has been approved for IOP, but he cannot begin until after his surgery. Cln reached out to CM of IOP who will contact pt with instructions.      Assessment and Plan: Counselor will continue to meet with patient to address treatment plan goals. Patient will continue to follow recommendations of providers and implement skills learned in session.  Follow Up Instructions: Counselor will send webex invitation weekly before each individual and group session.   I discussed the assessment and treatment plan with the patient. The patient was provided an opportunity to ask questions and all were answered. The patient agreed with the plan and demonstrated an understanding of the instructions.   The patient was advised to call back or seek an in-person evaluation if the symptoms worsen or if the condition fails to  improve as anticipated.  I provided 60 minutes of non-face-to-face time during this encounter.   Satchel Heidinger S, LCAS  .

## 2019-05-25 ENCOUNTER — Other Ambulatory Visit: Payer: Self-pay

## 2019-05-25 ENCOUNTER — Encounter (HOSPITAL_COMMUNITY): Payer: Self-pay | Admitting: Licensed Clinical Social Worker

## 2019-05-25 ENCOUNTER — Ambulatory Visit (HOSPITAL_COMMUNITY): Payer: Medicare HMO | Admitting: Licensed Clinical Social Worker

## 2019-05-25 DIAGNOSIS — F419 Anxiety disorder, unspecified: Secondary | ICD-10-CM

## 2019-05-25 DIAGNOSIS — F4310 Post-traumatic stress disorder, unspecified: Secondary | ICD-10-CM

## 2019-05-25 DIAGNOSIS — F331 Major depressive disorder, recurrent, moderate: Secondary | ICD-10-CM

## 2019-05-25 NOTE — Progress Notes (Signed)
Virtual Visit via Video Note  I connected with Mark [name] on 05/25/19 at 5:00 pm EDT by a video enabled telemedicine application and verified that I am speaking with the correct person using two identifiers.   I discussed the limitations of evaluation and management by telemedicine and the availability of in person appointments. The patient expressed understanding and agreed to proceed.  History of Present Illness: [facility], depression, anxiety.    Observations/Objective: Patient joined the group briefly to let the group members know how he's doing after his surgery. Group members were thoughtful in their expressions of comfort.   Assessment and Plan: Counselor will continue to meet with patient to address treatment plan goals. Patient will continue to follow recommendations of providers and implement skills learned in session.   Follow Up Instructions: I discussed the assessment and treatment plan with the patient. The patient was provided an opportunity to ask questions and all were answered. The patient agreed with the plan and demonstrated an understanding of the instructions.   The patient was advised to call back or seek an in-person evaluation if the symptoms worsen or if the condition fails to improve as anticipated.  I provided 15 minutes of non-face-to-face time during this encounter.   Amabel Stmarie S, LCAS

## 2019-05-26 ENCOUNTER — Other Ambulatory Visit (HOSPITAL_COMMUNITY): Payer: No Typology Code available for payment source | Attending: Family | Admitting: Family

## 2019-05-26 ENCOUNTER — Other Ambulatory Visit: Payer: Self-pay

## 2019-05-26 DIAGNOSIS — I10 Essential (primary) hypertension: Secondary | ICD-10-CM | POA: Insufficient documentation

## 2019-05-26 DIAGNOSIS — F331 Major depressive disorder, recurrent, moderate: Secondary | ICD-10-CM

## 2019-05-26 DIAGNOSIS — F17210 Nicotine dependence, cigarettes, uncomplicated: Secondary | ICD-10-CM | POA: Insufficient documentation

## 2019-05-26 DIAGNOSIS — Z794 Long term (current) use of insulin: Secondary | ICD-10-CM | POA: Insufficient documentation

## 2019-05-26 DIAGNOSIS — E119 Type 2 diabetes mellitus without complications: Secondary | ICD-10-CM | POA: Insufficient documentation

## 2019-05-26 DIAGNOSIS — Z888 Allergy status to other drugs, medicaments and biological substances status: Secondary | ICD-10-CM | POA: Insufficient documentation

## 2019-05-26 DIAGNOSIS — F4310 Post-traumatic stress disorder, unspecified: Secondary | ICD-10-CM | POA: Insufficient documentation

## 2019-05-26 DIAGNOSIS — Z79899 Other long term (current) drug therapy: Secondary | ICD-10-CM | POA: Insufficient documentation

## 2019-05-26 DIAGNOSIS — R45851 Suicidal ideations: Secondary | ICD-10-CM | POA: Insufficient documentation

## 2019-05-26 DIAGNOSIS — F329 Major depressive disorder, single episode, unspecified: Secondary | ICD-10-CM | POA: Insufficient documentation

## 2019-05-26 DIAGNOSIS — Z89611 Acquired absence of right leg above knee: Secondary | ICD-10-CM | POA: Insufficient documentation

## 2019-05-26 DIAGNOSIS — E785 Hyperlipidemia, unspecified: Secondary | ICD-10-CM | POA: Insufficient documentation

## 2019-05-26 NOTE — Progress Notes (Unsigned)
Attempted to f/u for new assessment. No answers

## 2019-05-27 ENCOUNTER — Other Ambulatory Visit: Payer: Self-pay

## 2019-05-27 ENCOUNTER — Other Ambulatory Visit (HOSPITAL_COMMUNITY): Payer: No Typology Code available for payment source | Admitting: Psychiatry

## 2019-05-27 ENCOUNTER — Telehealth (HOSPITAL_COMMUNITY): Payer: Self-pay | Admitting: Psychiatry

## 2019-05-27 NOTE — Telephone Encounter (Signed)
D:  Pt never started MH-IOP due to having a procedure done.  A:  Placed call to patient and he states he is still in a lot of pain, but still would like to attend MH-IOP once he's better.  Case manager provided him with her phone number.  Inform treatment team.  R:  Pt receptive.

## 2019-05-28 ENCOUNTER — Encounter (HOSPITAL_COMMUNITY): Payer: Self-pay | Admitting: Licensed Clinical Social Worker

## 2019-05-28 ENCOUNTER — Ambulatory Visit (HOSPITAL_COMMUNITY): Payer: Medicare HMO

## 2019-05-28 ENCOUNTER — Other Ambulatory Visit: Payer: Self-pay

## 2019-05-28 ENCOUNTER — Ambulatory Visit (INDEPENDENT_AMBULATORY_CARE_PROVIDER_SITE_OTHER): Payer: No Typology Code available for payment source | Admitting: Licensed Clinical Social Worker

## 2019-05-28 DIAGNOSIS — F419 Anxiety disorder, unspecified: Secondary | ICD-10-CM

## 2019-05-28 DIAGNOSIS — F4310 Post-traumatic stress disorder, unspecified: Secondary | ICD-10-CM

## 2019-05-28 DIAGNOSIS — F331 Major depressive disorder, recurrent, moderate: Secondary | ICD-10-CM

## 2019-05-28 NOTE — Progress Notes (Signed)
Virtual Visit via Video Note  I connected with Mark [name] on 05/28/19 at 2:00 pm EDT by video enabled telemedicine with the correct person using two identifiers.   I discussed the limitations of evaluation and management by telemedicine and the availability of in person appointments. The patient expressed understanding and agreed to proceed.  History of Present Illness: Pt was referred to [facility] [name] therapy for PTSD, depression and anxiety by the Mark [name].     Observations/Objective: Verbalize positive and hopeful feelings of the future.  Patient presents depressed and in extreme pain. He discussed his psychiatric symptoms and current life events. Patient discussed his current stressors: Family, $, health issues. Patient had surgery on Friday and is in extreme pain. The dr has prescribed pain meds. Patient described his feelings about the lack of his family involvement during his surgery and aftercare.Clinician utilized MI OARS to help patient reflect and summarize his thoughts and feelings Cln again provided psychoeducation on boundaries with his family. Role played using boundaries with his wife, son and daughter. Patient has been approved for IOP and will begin next week.   Assessment and Plan: Counselor will continue to meet with patient to address treatment plan goals. Patient will continue to follow recommendations of providers and implement skills learned in session.  Follow Up Instructions: Counselor will send webex invitation weekly before each individual and group session.   I discussed the assessment and treatment plan with the patient. The patient was provided an opportunity to ask questions and all were answered. The patient agreed with the plan and demonstrated an understanding of the instructions.   The patient was advised to call back or seek an in-person evaluation if the symptoms worsen or if the condition fails to improve as anticipated.  I provided 60 minutes of  non-face-to-face time during this encounter.   Mark [name], LCAS  .

## 2019-05-29 ENCOUNTER — Ambulatory Visit (HOSPITAL_COMMUNITY): Payer: Medicare HMO

## 2019-05-29 ENCOUNTER — Encounter (HOSPITAL_COMMUNITY): Payer: Self-pay | Admitting: Family

## 2019-05-29 NOTE — Patient Instructions (Signed)
Attempted to f/u for new patient assessment in IOP

## 2019-06-01 ENCOUNTER — Ambulatory Visit (HOSPITAL_COMMUNITY): Payer: Medicare HMO

## 2019-06-01 ENCOUNTER — Encounter (HOSPITAL_COMMUNITY): Payer: Self-pay | Admitting: Licensed Clinical Social Worker

## 2019-06-01 ENCOUNTER — Ambulatory Visit (INDEPENDENT_AMBULATORY_CARE_PROVIDER_SITE_OTHER): Payer: No Typology Code available for payment source | Admitting: Licensed Clinical Social Worker

## 2019-06-01 ENCOUNTER — Other Ambulatory Visit: Payer: Self-pay

## 2019-06-01 DIAGNOSIS — F4310 Post-traumatic stress disorder, unspecified: Secondary | ICD-10-CM

## 2019-06-01 NOTE — Progress Notes (Signed)
Virtual Visit via Video Note  I connected with Mark [name] on 06/01/19 at 5:00 pm EDT by a video enabled telemedicine application and verified that I am speaking with the correct person using two identifiers.   I discussed the limitations of evaluation and management by telemedicine and the availability of in person appointments. The patient expressed understanding and agreed to proceed.  History of Present Illness: [facility], depression, anxiety.    Observations/Objective:Patient participated in a discussion on hypervigilance, super alertness, as if there's an imminent threat. Clinician provided psychoeducation on hypervigilance, where the brain is overanalyzing and overreacting to sensory input. Encouraged patient to use management skills.   Assessment and Plan: Counselor will continue to meet with patient to address treatment plan goals. Patient will continue to follow recommendations of providers and implement skills learned in session.   Follow Up Instructions: I discussed the assessment and treatment plan with the patient. The patient was provided an opportunity to ask questions and all were answered. The patient agreed with the plan and demonstrated an understanding of the instructions.   The patient was advised to call back or seek an in-person evaluation if the symptoms worsen or if the condition fails to improve as anticipated.  I provided 90 minutes of non-face-to-face time during this encounter.   Peng Thorstenson S, LCAS

## 2019-06-02 ENCOUNTER — Ambulatory Visit (HOSPITAL_COMMUNITY): Payer: Medicare HMO

## 2019-06-02 ENCOUNTER — Other Ambulatory Visit: Payer: Self-pay

## 2019-06-02 ENCOUNTER — Other Ambulatory Visit (HOSPITAL_COMMUNITY): Payer: No Typology Code available for payment source | Admitting: Psychiatry

## 2019-06-02 DIAGNOSIS — Z794 Long term (current) use of insulin: Secondary | ICD-10-CM | POA: Diagnosis not present

## 2019-06-02 DIAGNOSIS — F4310 Post-traumatic stress disorder, unspecified: Secondary | ICD-10-CM

## 2019-06-02 DIAGNOSIS — E119 Type 2 diabetes mellitus without complications: Secondary | ICD-10-CM | POA: Diagnosis not present

## 2019-06-02 DIAGNOSIS — F17210 Nicotine dependence, cigarettes, uncomplicated: Secondary | ICD-10-CM | POA: Diagnosis not present

## 2019-06-02 DIAGNOSIS — Z888 Allergy status to other drugs, medicaments and biological substances status: Secondary | ICD-10-CM | POA: Diagnosis not present

## 2019-06-02 DIAGNOSIS — F339 Major depressive disorder, recurrent, unspecified: Secondary | ICD-10-CM | POA: Diagnosis present

## 2019-06-02 DIAGNOSIS — F329 Major depressive disorder, single episode, unspecified: Secondary | ICD-10-CM | POA: Diagnosis not present

## 2019-06-02 DIAGNOSIS — F331 Major depressive disorder, recurrent, moderate: Secondary | ICD-10-CM

## 2019-06-02 DIAGNOSIS — F411 Generalized anxiety disorder: Secondary | ICD-10-CM

## 2019-06-02 DIAGNOSIS — I10 Essential (primary) hypertension: Secondary | ICD-10-CM | POA: Diagnosis not present

## 2019-06-02 DIAGNOSIS — Z79899 Other long term (current) drug therapy: Secondary | ICD-10-CM | POA: Diagnosis not present

## 2019-06-02 DIAGNOSIS — Z89611 Acquired absence of right leg above knee: Secondary | ICD-10-CM | POA: Diagnosis not present

## 2019-06-02 DIAGNOSIS — R45851 Suicidal ideations: Secondary | ICD-10-CM | POA: Diagnosis not present

## 2019-06-02 DIAGNOSIS — E785 Hyperlipidemia, unspecified: Secondary | ICD-10-CM | POA: Diagnosis not present

## 2019-06-02 NOTE — Progress Notes (Signed)
Virtual Visit via Video Note  I connected with Mark [name] on @TODAY @ at  9:00 AM EDT by a video enabled telemedicine application and verified that I am speaking with the correct person using two identifiers.   I discussed the limitations of evaluation and management by telemedicine and the availability of in person appointments. The patient expressed understanding and agreed to proceed. I discussed the assessment and treatment plan with the patient. The patient was provided an opportunity to ask questions and all were answered. The patient agreed with the plan and demonstrated an understanding of the instructions.   The patient was advised to call back or seek an in-person evaluation if the symptoms worsen or if the condition fails to improve as anticipated.  I provided 20 minutes of non-face-to-face time during this encounter.   Patient ID: Mark [name], male   DOB: 05-26-1960, 59 y.o.   MRN: [id number] As per previous CCA: Pt reports to PHP per recommendation by individual therapist, [phone]. Pt shares his depression and anxiety have increased since stay at home orders for COVID-19. Pt shares that "not being able to go anywhere and the lack of human interaction" has really affected him. 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 Pt denies HI/AVH   Pt's therapist [facility], LCAS) referred pt to MH-IOP, treatment for ongoing depressive and anxiety symptoms.  Pt voices being in pain from a recent surgery.  Current stressors:  1) family conflict 2) financial strain 3)  health issues ) lack of support. Pt admits to passive SI.  Denies a plan or intent.  Able to contract for safety.  Denies HI or A/V hallucinations.  A:  Oriented pt to virtual MH-IOP.  Pt gave verbal consent for treatment, to release chart information to referred providers and to complete any forms if needed.  Pt also gave consent for attending group virtually d/t COVID-19 social distancing restrictions.  Encouraged support groups through [facility].  F/U with Thea Silversmith, LCAS and [facility].  R:  Pt receptive.  Idalia Needle, M.Ed, CNA

## 2019-06-02 NOTE — Progress Notes (Signed)
Virtual Visit via Video Note  I connected with Mark [name] on 06/02/19 at  9:00 AM EDT by a video enabled telemedicine application and verified that I am speaking with the correct person using two identifiers.   Case Manager discussed the limitations of evaluation and management by telemedicine and the availability of in person appointments. The patient expressed understanding and agreed to proceed.  Location:  Patient: Patient Home Provider: [place] [facility]  History of Present Illness: PTSD, GAD, MDD  Observations/Objective: Case Manager checked in with all participants to review discharge dates, insurance authorizations, work-related documents and needs for the treatment team. Clinician facilitated a check-in with group members to assess mood and current functioning. Clinician introduced self and prompted client to provide an update on functioning since last group session. Clinician provided calming meditation focused on Gratitude and encouraged members to notice wandering thoughts and bring them back to the present moment. Client checked in and introduced self to clinician and reported that he has been active in tx services within this clinic for many years and sees clinician Binnie Rail weekly. Client identified himself as a disabled veteran and relayed stress related to finances as well as difficulty implementing boundaries with others, specifically his family. Client related to others throughout check in and provided feedback and supportive words.  Psycho-educational portion of group centered on self-care along with the impact it has on mental health. Clinician shared psychoeducational video 'Self Care: What It Really Is' which challenges traditional thoughts on what self-care looks like. Clinician allowed space for member's to process their responses to the video and encouraged them to share with peers.  Clinician utilized open ended questions to prompt discussion when needed and appropriate  re-direction. Clinician praised client's insight and willingness to engage. Client engaged in discussion on on presented video and reported he thinks of self-care as "a system for your self regulation and improvement of your mental state". Client identified a phrase from the video which resonated with him as "showing up for yourself everyday" however admitted that he rarely does thinks to care for himself.   Clinician shared resource handout, 'Self Care Assessment' which included examples of multiple dimensions of self-care including- Physical, Emotional, Social, Spiritual, and Professional. Clinician encouraged members to complete provided assessment and identify areas in which they are doing well as well as areas that can be improved. Clinician and members engaged in discussion on their responses including barriers to their completion of self-care and motivation to make positive change in their self-care practice. Clinician facilitated check out assessing an area of gratitude and self-care activity that members will engage in prior to next group session. Client completed provided assessment and reported he can improve in physical self-care by eating healthy and regularly. Client also identified difficulty engaging in social self-care because of the pandemic however relayed that he is thankful for his established network within his church. Client engaged in check out and reported that today he is grateful for beginning this program and plans to take his medications as prescribed as well as take a nap in order to take care of himself prior to tomorrow's group session.  Assessment and Plan: Clinician recommends that patient remains in IOP treatment to better manage mental health symptoms and continue to address treatment plan goals. Clinician recommends adherence to crisis/safety plan, taking medications as prescribed, and following up with medical professionals if any issues arise.  Follow Up  Instructions: Clinician will send Webex link for next session. The patient was advised to  call back or seek an in-person evaluation if the symptoms worsen or if the condition fails to improve as anticipated.   I provided 180 minutes of non-face-to-face time during this encounter.   Francine Graven, LCSW

## 2019-06-03 ENCOUNTER — Ambulatory Visit (HOSPITAL_COMMUNITY): Payer: Medicare HMO

## 2019-06-03 ENCOUNTER — Other Ambulatory Visit (HOSPITAL_COMMUNITY): Payer: No Typology Code available for payment source | Admitting: Licensed Clinical Social Worker

## 2019-06-03 ENCOUNTER — Other Ambulatory Visit: Payer: Self-pay

## 2019-06-03 ENCOUNTER — Encounter (HOSPITAL_COMMUNITY): Payer: Self-pay | Admitting: Family

## 2019-06-03 DIAGNOSIS — F411 Generalized anxiety disorder: Secondary | ICD-10-CM

## 2019-06-03 DIAGNOSIS — F329 Major depressive disorder, single episode, unspecified: Secondary | ICD-10-CM | POA: Diagnosis not present

## 2019-06-03 DIAGNOSIS — F4310 Post-traumatic stress disorder, unspecified: Secondary | ICD-10-CM

## 2019-06-03 DIAGNOSIS — F332 Major depressive disorder, recurrent severe without psychotic features: Secondary | ICD-10-CM

## 2019-06-03 NOTE — Progress Notes (Signed)
Virtual Visit via Video Note   I connected with [name] on 06/03/19 at 9:00 AM EST by a video enabled telemedicine application and verified that I am speaking with the correct person using two identifiers.   Location: Patient: Patient Home Provider: [facility]   Case Manager discussed the limitations of evaluation and management by telemedicine and the availability of in person appointments during orientation. The patient expressed understanding and agreed to proceed.   History of Present Illness: MDD, GAD, PTSD    Observations/Objective: Case Manager checked in with all participants to review discharge dates, insurance authorizations, work-related documents and needs for the treatment team. Counselor facilitated a check-in with group members to gauge mood and current functioning as well as identify recent progress towards treatment goals.  Alvin presented to session on time and was alert, oriented x5, with no evidence or self-report of SI/HI or A/V H.  He reported scores of 4/10 for depression, 10/10 for anxiety, and 7/10 for pain today.  Ronen reported that he is in a great deal of pain today and anticipates this getting worse, as he will have a nurse coming over before noon to redress his wounds, and this stresses him out.  Juandavid reported that although he is on pain medication, he is wary of taking too much due to fear of dependence, so he takes it PRN as recommended by his MD.  [name] reported that he would like to build up more coping skills to assist with pain management to complement medication, as meditation has not worked for him, and he finds it hard to slow down racing thoughts.  Kenner initially apologized for expressing his frustration at current situation, but was receptive to positive support from other group members following his check in.         Counselor introduced topic of building social support network today.  Counselor explained how this can be defined as having a having a  group of healthy people in one's life you can talk to, spend time with, and get help from to improve both mental and physical health.  Counselor noted that some barriers can make it difficult to connect with other people, including the presence of anxiety or depression, or moving to an unfamiliar area.  Group members were asked to assess the current state of their support network, and identify ways that this could be improved.  Tips were given on how to address previously noted barriers, such as strengthening social skills, using relaxation techniques to reduce anxiety, scheduling social time each week, and/or exploring social events nearby which could increase chances of meeting new supports.  Members were also encouraged to consider getting closer to people they already know through suggestions such as outreaching someone by text, email or phone call if they haven't spoken in awhile, doing something nice for a friend/family member unexpectedly, and/or inviting someone over for a game/movie/dinner night.  Intervention was effective, as evidenced by [name] participating in discussion on the topic and reporting that he find it difficult to 'let people in' because he has several issues with trusting new people based upon past experiences where he shared personal information, only to find out that this was shared with 3rd parties without his permission.  Salaam reported that despite this hesitation, he has found a great deal of support through his local church and tries to keep a tight circle of people he can reach out from that setting, in addition to his therapist, and people he is getting to  know in IOP.  Harl reported that IOP in particular has been therapeutic and opened his mind, stating "I'm not used to people treating me as good as I am towards them".  Obe reported that he will continue trying to be more open in group so that he can express repressed feelings and find a safe outlet around relatable peers.  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    Assessment and Plan: Counselor recommends that patient remains in IOP treatment to better manage mental health symptoms and continue to address treatment plan goals. Counselor recommends adherence to crisis/safety plan, taking medications as prescribed and following up with medical professionals if any issues arise.    Follow Up Instructions: Counselor will send Webex link for next session.  The patient was advised to call back or seek an in-person evaluation if the symptoms worsen or if the condition fails to improve as anticipated.   I provided 180 minutes of non-face-to-face time during this encounter.     [name], LCSW, LCAS

## 2019-06-03 NOTE — Progress Notes (Signed)
Virtual Visit via Telephone Note  I connected with Mark [name] on 06/03/19 at  9:00 AM EDT by telephone and verified that I am speaking with the correct person using two identifiers.   I discussed the limitations, risks, security and privacy concerns of performing an evaluation and management service by telephone and the availability of in person appointments. I also discussed with the patient that there may be a patient responsible charge related to this service. The patient expressed understanding and agreed to proceed.   I discussed the assessment and treatment plan with the patient. The patient was provided an opportunity to ask questions and all were answered. The patient agreed with the plan and demonstrated an understanding of the instructions.   The patient was advised to call back or seek an in-person evaluation if the symptoms worsen or if the condition fails to improve as anticipated.  I provided 15 minutes of non-face-to-face time during this encounter.   Mark Rack, NP    Psychiatric Initial Adult Assessment   Patient Identification: Mark [name] MRN:  [id number] Date of Evaluation:  06/03/2019 Referral Source: PHP Chief Complaint:  Depression with passive suicidal ideations  Visit Diagnosis: No diagnosis found.  History of Present Illness: Mark [name] is a 59 y.o. African American male presents with worsening  Depression related to PTSD symptoms.  Patient reports he recently discharged from inpatient admission from the [place].  Due to worsening suicidal thoughts.  Reports he did not feel as if his antidepressants and anxiety medications was very effective.  States he is unsure of which medication he was titrated off of however states he was prescribed 21 medication however he is now taking 8 pills only.  Continues to report anxiety 9 out of 10 with 10 being the worst.  Reports his children continue to be supportive.  Patient has completed partial hospitalization programming in  the past.  Reports he is followed by a therapist Research scientist (medical).   Evaluation: Mark [name] was evaluated via teleassessment.  He continues to endorse pain due to a recent surgery. ( pilonidal cyst) Reports feeling groggy and foggy related to pain medications.  Patient reported confusion with current medication regimen.  Discussed follow-up with patient when he is coherent and alert, as he reports he has a dressing changing schedule in the next 30 minutes. Reported he was recently evaluated by attending psychiatrist with multiple medication adjustments and he is unable to recall which medications he was supposed to titrate off and start.  NP attempted to address assessment and plan after recent visit with attending psychiatrist.  We will follow-up with patient 5/ 15 regarding medication management.  Patient to consider pill packing.  Mark [name] continues to deny suicidal or homicidal ideations.  Denies auditory or visual hallucinations. Patient reports confusion with current prescribed medications.Mark [name] recently completed Partial Hospitalization 03/2019.  Continues to report ongoing stressors related to social isolation, depression and worsening anxiety.  Patient reported previous inpatient admission prior to attending partial hospitalization programming with medication adjustments at that time as well.  Support, encouragement and reassurance was provided.    Associated Signs/Symptoms: Depression Symptoms:  depressed mood, difficulty concentrating, suicidal thoughts without plan, loss of energy/fatigue, disturbed sleep, (Hypo) Manic Symptoms:  Distractibility, Irritable Mood, Anxiety Symptoms:  Excessive Worry, Psychotic Symptoms:  Hallucinations: None PTSD Symptoms: NA  Past Psychiatric History: Past psychiatric hospitalizations , Previous suicide attempts and Past medication trials Buspar, Wellbutrin 300 mg, Lamictal, Trazodone and Lexapro  Previous Psychotropic Medications: No   Substance Abuse History in  the last 12 months:  No. Prescription medications history   Consequences of Substance Abuse: NA  Past Medical History:  Past Medical History:  Diagnosis Date  . Above knee amputation status, right   . Anxiety   . Depression   . Diabetes mellitus, type II ([place])   . Hyperlipemia   . Hypertension   . PTSD (post-traumatic stress disorder)   . Shoulder pain     Past Surgical History:  Procedure Laterality Date  . HERNIA REPAIR    . LEG AMPUTATION Right   . parenatal cyst      Family Psychiatric History:   Family History:  Family History  Problem Relation Age of Onset  . Aneurysm Mother   . Leukemia Father   . Dementia Maternal Aunt   . Seizures Paternal Uncle     Social History:   Social History   Socioeconomic History  . Marital status: Married    Spouse name: Not on file  . Number of children: Not on file  . Years of education: Not on file  . Highest education level: Not on file  Occupational History  . Not on file  Tobacco Use  . Smoking status: Current Every Day Smoker    Packs/day: 1.50    Years: 35.00    Pack years: 52.50    Types: Cigarettes  . Smokeless tobacco: Never Used  . Tobacco comment: Had quit for 3 years then restarted and back up to 1 pk a day.  Substance and Sexual Activity  . Alcohol use: Yes    Alcohol/week: 4.0 standard drinks    Types: 2 Cans of beer, 2 Shots of liquor per week    Comment: Reports light drinking weekly.  . Drug use: Yes    Frequency: 14.0 times per week    Types: Marijuana    Comment: Uses 1-2 times a day per report for his anxiety  . Sexual activity: Not Currently  Other Topics Concern  . Not on file  Social History Narrative  . Not on file   Social Determinants of Health   Financial Resource Strain:   . Difficulty of Paying Living Expenses:   Food Insecurity:   . Worried About Charity fundraiser in the Last Year:   . Arboriculturist in the Last Year:   Transportation Needs:   . Film/video editor  (Medical):   Mark [name] Kitchen Lack of Transportation (Non-Medical):   Physical Activity:   . Days of Exercise per Week:   . Minutes of Exercise per Session:   Stress:   . Feeling of Stress :   Social Connections:   . Frequency of Communication with Friends and Family:   . Frequency of Social Gatherings with Friends and Family:   . Attends Religious Services:   . Active Member of Clubs or Organizations:   . Attends Archivist Meetings:   Mark [name] Kitchen Marital Status:     Additional Social History:   Allergies:   Allergies  Allergen Reactions  . Ozempic (0.25 Or 0.5 Mg-Dose) [Semaglutide(0.25 Or 0.5mg -Dos)] Other (See Comments)    Caused Pancreatitis  . Lyrica [Pregabalin] Rash    Metabolic Disorder Labs: No results found for: HGBA1C, MPG No results found for: PROLACTIN No results found for: CHOL, TRIG, HDL, CHOLHDL, VLDL, LDLCALC No results found for: TSH  Therapeutic Level Labs: No results found for: LITHIUM No results found for: CBMZ No results found for: VALPROATE  Current Medications: Current Outpatient Medications  Medication Sig Dispense Refill  .  ARIPiprazole (ABILIFY) 5 MG tablet Take 1 tablet (5 mg total) by mouth daily. Take 1/2 tab daily for one week and than full tab daily 30 tablet 1  . atorvastatin (LIPITOR) 80 MG tablet Take 80 mg by mouth daily.    Mark [name] Kitchen buPROPion (WELLBUTRIN XL) 300 MG 24 hr tablet Take 1 tablet (300 mg total) by mouth daily. 90 tablet 0  . busPIRone (BUSPAR) 10 MG tablet Take 1 tablet (10 mg total) by mouth 3 (three) times daily. 90 tablet 1  . carvedilol (COREG) 12.5 MG tablet Take 12.5 mg by mouth 2 (two) times daily with a meal.    . cyclobenzaprine (FLEXERIL) 10 MG tablet Take 10 mg by mouth 2 (two) times daily as needed for muscle spasms.    Mark [name] Kitchen diltiazem (TIAZAC) 360 MG 24 hr capsule Take 360 mg by mouth daily.    Mark [name] Kitchen escitalopram (LEXAPRO) 20 MG tablet Take 10 mg by mouth daily. Take 10 mg for one week and than stop.    . insulin aspart protamine-  aspart (NOVOLOG MIX 70/30) (70-30) 100 UNIT/ML injection Inject into the skin.    . Insulin Glargine (LANTUS [place]) Inject into the skin.    Mark [name] Kitchen lisinopril (PRINIVIL,ZESTRIL) 5 MG tablet Take 5 mg by mouth daily.    . metFORMIN (GLUCOPHAGE) 1000 MG tablet Take 1,000 mg by mouth 2 (two) times daily with a meal.    . methocarbamol (ROBAXIN) 500 MG tablet Take 500 mg by mouth 4 (four) times daily.    . modafinil (PROVIGIL) 100 MG tablet Take 100 mg by mouth daily.    Mark [name] Kitchen oxyCODONE-acetaminophen (PERCOCET) 5-325 MG tablet Take by mouth every 4 (four) hours as needed for severe pain.    . prazosin (MINIPRESS) 2 MG capsule Take 1 capsule (2 mg total) by mouth at bedtime. 90 capsule 0  . sildenafil (VIAGRA) 100 MG tablet Take 100 mg by mouth daily as needed for erectile dysfunction.     No current facility-administered medications for this visit.    Musculoskeletal:   Psychiatric Specialty Exam: Review of Systems  There were no vitals taken for this visit.There is no height or weight on file to calculate BMI.  General Appearance: NA  Eye Contact:  NA  Speech:  Clear and Coherent  Volume:  Normal  Mood:  Anxious and Depressed  Affect:  Congruent  Thought Process:  Coherent  Orientation:  Full (Time, Place, and Person)  Thought Content:  Hallucinations: None  Suicidal Thoughts:  No  Homicidal Thoughts:  No  Memory:  Immediate;   Fair Remote;   Fair  Judgement:  Fair  Insight:  Fair  Psychomotor Activity:  Normal  Concentration:  Concentration: Fair  Recall:  [employer] of Knowledge:Fair  Language: Fair  Akathisia:  No  Handed:  Right  AIMS (if indicated):    Assets:  Communication Skills Desire for Improvement Resilience Social Support  ADL's:  Intact  Cognition: WNL  Sleep:  Fair   Screenings: GAD-7     Counselor from 06/03/2017 in BEHAVIORAL HEALTH OUTPATIENT THERAPY [place]  Total GAD-7 Score  12    PHQ2-9     Counselor from 04/27/2019 in BEHAVIORAL HEALTH PARTIAL  HOSPITALIZATION PROGRAM Counselor from 04/01/2019 in BEHAVIORAL HEALTH PARTIAL HOSPITALIZATION PROGRAM Counselor from 06/12/2018 in BEHAVIORAL HEALTH PARTIAL HOSPITALIZATION PROGRAM Counselor from 06/05/2018 in BEHAVIORAL HEALTH PARTIAL HOSPITALIZATION PROGRAM Counselor from 05/22/2018 in BEHAVIORAL HEALTH PARTIAL HOSPITALIZATION PROGRAM  PHQ-2 Total Score  6  6  4  3   6  PHQ-9 Total Score  20  17  14  15  24       Assessment and Plan:  Mark [name] started Intensive Outpatient Programming Continue Medication as directed -We will follow-up on 5/15 for medication management clarification -We will discuss pill packing  Treatment plan was reviewed and agreed upon by NP T.6/15 and patient Mark [name] need for group services    Edwina Barth, NP 5/12/20218:25 AM

## 2019-06-04 ENCOUNTER — Ambulatory Visit (HOSPITAL_COMMUNITY): Payer: Medicare HMO | Admitting: Licensed Clinical Social Worker

## 2019-06-04 ENCOUNTER — Other Ambulatory Visit: Payer: Self-pay

## 2019-06-04 ENCOUNTER — Ambulatory Visit (HOSPITAL_COMMUNITY): Payer: Medicare HMO

## 2019-06-04 ENCOUNTER — Other Ambulatory Visit (HOSPITAL_COMMUNITY): Payer: No Typology Code available for payment source | Admitting: Licensed Clinical Social Worker

## 2019-06-04 DIAGNOSIS — F332 Major depressive disorder, recurrent severe without psychotic features: Secondary | ICD-10-CM

## 2019-06-04 DIAGNOSIS — F4310 Post-traumatic stress disorder, unspecified: Secondary | ICD-10-CM

## 2019-06-04 DIAGNOSIS — F411 Generalized anxiety disorder: Secondary | ICD-10-CM

## 2019-06-04 DIAGNOSIS — R4589 Other symptoms and signs involving emotional state: Secondary | ICD-10-CM

## 2019-06-04 DIAGNOSIS — F329 Major depressive disorder, single episode, unspecified: Secondary | ICD-10-CM | POA: Diagnosis not present

## 2019-06-04 NOTE — Progress Notes (Signed)
Virtual Visit via Video Note  I connected with [name] on 06/04/19 at  9:00 AM EDT by a video enabled telemedicine application and verified that I am speaking with the correct person using two identifiers.   Case Manager discussed the limitations of evaluation and management by telemedicine and the availability of in person appointments. The patient expressed understanding and agreed to proceed.  Location:  Patient: Patient Home Provider: [facility] OPT Office  History of Present Illness: MDD, GAD, PTSD  Observations/Objective: Case Manager checked in with all participants to review discharge dates, insurance authorizations, work-related documents and needs for the treatment team. Clinician facilitated a check-in with group members to assess mood and current functioning. Clinician introduced self and prompted client to provide an update on functioning since last group session. Client checked in by providing an update and reported that he is in pain due to a recent surgical procedure. Client expressed frustration at the medications prescribed by his surgeon however acknowledges why certain precautions are in place. Client verbalized frustrations that these precautions and policies regarding Opioids impact him. Client denied any current SI/HI/psychosis. Clinician finished check in by providing grounding exercise/guided meditation on physical and emotional healing and encouraged members to practice mindfulness in noticing their mind wondering and bringing it back to the present moment.  Group discussion was provided by [facility] with [facility]. Alex introduced self her role with joining the IOP group to provide psycho-education on specific services and programs offered within [employer]. Alex allowed space for members to ask questions and inquire on specific programs that they are interested in. Clinician followed up with members following Alex's presentation and facilitated  discussion on which programs they plan to look into or inquire on. Clinician assessed for 1 self-care activity prior to tomorrow's group. Client engaged in discussion on how he is hoping to work on forming routine and structure for his day to day following discharge from this group. Client reported that he was interested in several groups with [facility] however does not feel comfortable attending them in person at this time. Client reported that today, he has an interview for a potential employment opportunity and plans to rest following that meeting.  Assessment and Plan: Clinician recommends that patient remains in IOP treatment to better manage mental health symptoms and continue to address treatment plan goals. Clinician recommends adherence to crisis/safety plan, taking medications as prescribed, and following up with medical professionals if any issues arise.  Follow Up Instructions: Clinician will send Webex link for next session. The patient was advised to call back or seek an in-person evaluation if the symptoms worsen or if the condition fails to improve as anticipated.   I provided 180 minutes of non-face-to-face time during this encounter.   Francine Graven, LCSW

## 2019-06-05 ENCOUNTER — Ambulatory Visit (HOSPITAL_COMMUNITY): Payer: Medicare HMO

## 2019-06-05 ENCOUNTER — Other Ambulatory Visit (HOSPITAL_COMMUNITY): Payer: No Typology Code available for payment source

## 2019-06-05 ENCOUNTER — Other Ambulatory Visit: Payer: Self-pay

## 2019-06-08 ENCOUNTER — Ambulatory Visit (HOSPITAL_COMMUNITY): Payer: No Typology Code available for payment source | Admitting: Licensed Clinical Social Worker

## 2019-06-08 ENCOUNTER — Other Ambulatory Visit: Payer: Self-pay

## 2019-06-08 ENCOUNTER — Other Ambulatory Visit (HOSPITAL_COMMUNITY): Payer: No Typology Code available for payment source | Admitting: Licensed Clinical Social Worker

## 2019-06-08 DIAGNOSIS — F329 Major depressive disorder, single episode, unspecified: Secondary | ICD-10-CM | POA: Diagnosis not present

## 2019-06-08 DIAGNOSIS — F411 Generalized anxiety disorder: Secondary | ICD-10-CM

## 2019-06-08 DIAGNOSIS — F332 Major depressive disorder, recurrent severe without psychotic features: Secondary | ICD-10-CM

## 2019-06-08 DIAGNOSIS — F4310 Post-traumatic stress disorder, unspecified: Secondary | ICD-10-CM

## 2019-06-08 NOTE — Progress Notes (Signed)
Virtual Visit via Video Note   I connected with [name] on 06/08/19 at 9:00 AM EST by a video enabled telemedicine application and verified that I am speaking with the correct person using two identifiers.   Location: Patient: Patient Home Provider: Clinical Home Office   Case Manager discussed the limitations of evaluation and management by telemedicine and the availability of in person appointments during orientation. The patient expressed understanding and agreed to proceed.   History of Present Illness: MDD, GAD, PTSD    Observations/Objective: Case Manager checked in with all participants to review discharge dates, insurance authorizations, work-related documents and needs for the treatment team. Counselor facilitated a check-in with group members to gauge mood and current functioning as well as identify recent progress towards treatment goals.  Timur presented to session on time and was alert, oriented x5, with no evidence or self-report of SI/HI or A/V H.  He reported scores of 2/10 for depression, 7/10 for anxiety, and 8/10 for pain today.  Styles reported that he went to the doctor on Friday and did not receive the hopeful outcome he wished for, so it was a painful weekend overall for him, and he acknowledged the he neglected eating regularly.  Rastus reported that his plan this week is to revisit his to-do list, prioritize these according to importance, and make a realistic schedule for following days that will help him stay productive without overdoing it.  Romelle stated "I think if I can just do 1-2 things at a time, it will be easier and Ill feel less stressed".  Zakari was able to give himself credit for resiliency, stating "I am making it through all this".    Counselor introduced topic of anger management today.  Counselor shared a handout with members on this subject featuring a variety of coping skills, and facilitated discussion on these approaches.  Examples included raising awareness  of anger triggers, practicing deep breathing, keeping an anger log to better understand episodes, using diversion activities to distract oneself for 30 minutes, taking a time out when necessary, and being mindful of warning signs tied to thoughts or behavior.  Counselor inquired about which techniques group members have used before, what has proved to be helpful, what their unique warning signs might be, as well as what they will try out in the future to assist with de-escalation.  Intervention was effective, as evidenced by [name] participating in discussion on the topic and stating "My attitude and view drives how angry I can and will get".  Majestic reported that pain heavily influences his anger, and that one way he avoids conflict is monitoring emotional state to determine whether he is feeling on edge.  Herbie reported that this helps him decide when he needs to avoid people and isolate, since he has overreacted in the past when triggered by small, unrelated issues.  Ying reported that one warning sign that indicates his anger is rising is when he feels an increase in blood pressure.  Lanorris reported that anger management is a primary goal for him during treatment, and one way he intends to address this is by implementing a self-care routine which supports calmer mindset.  He reported that he plans to go through a coping skill checklist soon in free time to identify new, healthier activities which appeal to him.    Assessment and Plan: Counselor recommends that patient remains in IOP treatment to better manage mental health symptoms and continue to address treatment plan goals. Counselor recommends adherence to  crisis/safety plan, taking medications as prescribed and following up with medical professionals if any issues arise.    Follow Up Instructions: Counselor will send Webex link for next session.  The patient was advised to call back or seek an in-person evaluation if the symptoms worsen or if the condition  fails to improve as anticipated.   I provided 180 minutes of non-face-to-face time during this encounter.     [name], LCSW, LCAS

## 2019-06-09 ENCOUNTER — Other Ambulatory Visit (HOSPITAL_COMMUNITY): Payer: No Typology Code available for payment source | Admitting: Licensed Clinical Social Worker

## 2019-06-09 ENCOUNTER — Other Ambulatory Visit: Payer: Self-pay

## 2019-06-09 DIAGNOSIS — F329 Major depressive disorder, single episode, unspecified: Secondary | ICD-10-CM | POA: Diagnosis not present

## 2019-06-09 DIAGNOSIS — F411 Generalized anxiety disorder: Secondary | ICD-10-CM

## 2019-06-09 DIAGNOSIS — F332 Major depressive disorder, recurrent severe without psychotic features: Secondary | ICD-10-CM

## 2019-06-09 DIAGNOSIS — F4310 Post-traumatic stress disorder, unspecified: Secondary | ICD-10-CM

## 2019-06-09 NOTE — Progress Notes (Signed)
Virtual Visit via Video Note  I connected withEric [name] on 05/01/19 at 9:00 AM EDT by a video enabled telemedicine application and verified that I am speaking with the correct person using two identifiers.  I discussed the limitations of evaluation and management by telemedicine and the availability of in person appointments. The patient expressed understanding and agreed to proceed.  I discussed the assessment and treatment plan with the patient. The patient was provided an opportunity to ask questions and all were answered. The patient agreed with the plan and demonstrated an understanding of the instructions.  The patient was advised to call back or seek an in-person evaluation if the symptoms worsen or if the condition fails to improve as anticipated.  Pt was provided 240 minutes of non-face-to-face time during this encounter.   Mark [name], [facility]    [facility] PHP THERAPIST PROGRESS NOTE  Mark [name] [id number]  Session Time: 9:00 - 10:00  Participation Level: Active  Behavioral Response: CasualAlertDepressed  Type of Therapy: Group Therapy  Treatment Goals addressed: Coping  Interventions: CBT, DBT, Supportive and Reframing  Summary: Clinician led check-in regarding current stressors and situation, and review of patient completed daily inventory. Clinician utilized active listening and empathetic response and validated patient emotions. Clinician facilitated processing group on pertinent issues  Therapist Response: Mark [name] is a 59 y.o. male who presents with depression and anxiety symptoms.  Patient arrived within time allowed and reports that he is feeling "anxious about last day." Patient rates his mood at a 5 on a scale of 1-10 with 10 being great. Pt reports he has been reviewing what he has learned and know he has a lot of skills to use to help him. Pt reports he slept well, though his dog was having anxiety and continued to wake him up. Pt is  planning to call [facility] to continue support. Patient engaged in discussion.           Session Time: 10:00 -11:00   Participation Level: Active   Behavioral Response: CasualAlertDepressed   Type of Therapy: Group Therapy   Treatment Goals addressed: Coping   Interventions: CBT, DBT, Solution Focused, Supportive and Reframing   Summary: Cln introduced topic of triggers. Cln discussed the importance of knowing triggers and what skills to use when triggered or prevent getting worked up.     Therapist Response: Pt engaged in discussion. Pt reports it can be frustrating not to have a "road map" and understand everything ahead of time.        Session Time: 11:00- 12:00   Participation Level: Active   Behavioral Response: CasualAlertDepressed   Type of Therapy: Group Therapy, OT   Treatment Goals addressed: Coping   Interventions: Psychosocial skills training, Supportive   Summary: Occupational Therapy group   Therapist Response: Patient engaged in group. See OT note.            Session Time: 12:00 -1:00   Participation Level: Active   Behavioral Response: CasualAlertDepressed   Type of Therapy: Group therapy   Treatment Goals addressed: Coping   Interventions: CBT; Solution focused; Supportive; Reframing   Summary: 12:00 - 12:50: Clinician introduced topic of "Positive Psychology". Group watched "Positive Psychology" Ted-Talk. Patients discussed how their "lens" of life effects the way they feel. Group discussed 5 strategies to help change lens. Patients identified one strategy they would be willing to try to change their "lens" for at least 21 days to create a new habit. 12:50 -1:00 Clinician led check-out.  Clinician assessed for immediate needs, medication compliance and efficacy, and safety concerns   Therapist Response: 12:00 - 12:50: Pt engaged in discussion and reports journaling could be helpful for him to change his lens.  12:50 -  1:00: At check-out, patient rates his mood at an 8 on a scale of 1-10 with 10 being great. Pt states afternoon plans of a doctors appointment and running errands. Patient demonstrates some progress as evidenced by increased ability to identify skills to use when experiencing symptoms. Patient denies SI/HI/self-harm at the end of group.      Suicidal/Homicidal: Nowithout intent/plan   Plan: Patient will discharge from [facility] due to meeting treatment goals of increased stabilization post-hospital discharge,decreased depression and anxiety symptoms,and increased ability to manage symptoms in a healthy manner.  Progress was measured by observation, self-report, and scales. Psychiatrist has approved discharge and patient reports alignment with discharge plan. Patient will step down to IOP at [facility]. Patient is also scheduled for individual therapy with Binnie Rail on 4/14. Patient is schedule with Dr. Adele Schilder for psychiatry on 4/22. Pt is scheduled to continue veterans group with Binnie Rail 1x a week starting 4/12. Patient denies any SI/HI at time of discharge.    Diagnosis:      MDD (major depressive disorder), recurrent severe, without psychosis ([place]) [F33.2]                          1. MDD (major depressive disorder), recurrent severe, without psychosis ([place])   2. Generalized anxiety disorder   3. PTSD (post-traumatic stress disorder)    Mark [name], [facility], LCASA 05/01/2019

## 2019-06-09 NOTE — Psych (Signed)
Virtual Visit via Video Note  I connected with Mark [name] on 04/17/19 at  9:00 AM EDT by a video enabled telemedicine application and verified that I am speaking with the correct person using two identifiers.   I discussed the limitations of evaluation and management by telemedicine and the availability of in person appointments. The patient expressed understanding and agreed to proceed.   I discussed the assessment and treatment plan with the patient. The patient was provided an opportunity to ask questions and all were answered. The patient agreed with the plan and demonstrated an understanding of the instructions.   The patient was advised to call back or seek an in-person evaluation if the symptoms worsen or if the condition fails to improve as anticipated.  Pt was provided 240 minutes of non-face-to-face time during this encounter.   [name], [facility]    [facility] PHP THERAPIST PROGRESS NOTE  Mark [name] [id number]  Session Time: 9:00 - 10:00  Participation Level: Active  Behavioral Response: CasualAlertDepressed  Type of Therapy: Group Therapy  Treatment Goals addressed: Coping  Interventions: CBT, DBT, Supportive and Reframing  Summary: Clinician led check-in regarding current stressors and situation, and review of patient completed daily inventory. Clinician utilized active listening and empathetic response and validated patient emotions. Clinician facilitated processing group on pertinent issues  Therapist Response: Mark [name] is a 59 y.o. male who presents with depression and anxiety symptoms.  Patient arrived within time allowed and reports that he is feeling "better than yesterday." Patient rates his mood at a 7 on a scale of 1-10 with 10 being great. Pt shares he napped from 10a-7:30p because he was very tired and didn't feel good. Pt reports he shaved and showered yesterday. Pt reports continued struggles with procrastination. Pt able to process. Pt engaged  in discussion.           Session Time: 10:00 -11:00   Participation Level: Active   Behavioral Response: CasualAlertDepressed   Type of Therapy: Group Therapy   Treatment Goals addressed: Coping   Interventions: CBT, DBT, Solution Focused, Supportive and Reframing   Summary: Cln introduced topic of grounding techniques and ways to combat procrastination.     Therapist Response: Pt engaged in discussion. Pt reports he sometimes struggles with recognizing and feeling like he is worthy to get things done.       Session Time: 11:00- 12:00   Participation Level: Active   Behavioral Response: CasualAlertDepressed   Type of Therapy: Group Therapy, OT   Treatment Goals addressed: Coping   Interventions: Psychosocial skills training, Supportive   Summary: Occupational Therapy group   Therapist Response: Patient engaged in group. See OT note.            Session Time: 12:00 -1:00   Participation Level: Active   Behavioral Response: CasualAlertDepressed   Type of Therapy: Group therapy   Treatment Goals addressed: Coping   Interventions: CBT; Solution focused; Supportive; Reframing   Summary: 12:00 - 12:50: Clinician introduced topic of "Emotional First Aid". Group watched Ted-Talk. Patients discussed how physical health is valued over emotional health. Patients discussed how to build emotional resilience. 12:50 -1:00 Clinician led check-out. Clinician assessed for immediate needs, medication compliance and efficacy, and safety concerns   Therapist Response: 12:00 - 12:50: Pt engaged in discussion and reports building emotional resilience will be important for them, including preventing loneliness, building boundaries, and manage ruminations.  12:50 - 1:00: At check-out, patient rates his mood at an 8 on a scale of  1-10 with 10 being great. Pt states afternoon plans of packing boxes. Patient reports wife is coming to visit this weekend. Patient demonstrates some  progress as evidenced by increased ability to use skills to manage symptoms. Patient denies SI/HI/self-harm at the end of group.     Suicidal/Homicidal: Nowithout intent/plan   Plan: Pt will continue in PHP while working to increase stabilization post-hospital discharge, decrease depression and anxiety symptoms, and increase ability to manage symptoms in a healthy manner.   Diagnosis: Major depressive disorder, recurrent episode, moderate (HCC) [F33.1]    1. Major depressive disorder, recurrent episode, moderate (HCC)   2. Generalized anxiety disorder   3. PTSD (post-traumatic stress disorder)       Quinn Axe, [facility] 04/17/2019

## 2019-06-10 ENCOUNTER — Ambulatory Visit (HOSPITAL_COMMUNITY): Payer: Medicare HMO | Admitting: Psychiatry

## 2019-06-10 ENCOUNTER — Other Ambulatory Visit: Payer: Self-pay

## 2019-06-10 ENCOUNTER — Other Ambulatory Visit (HOSPITAL_COMMUNITY): Payer: No Typology Code available for payment source | Admitting: Psychiatry

## 2019-06-10 DIAGNOSIS — F329 Major depressive disorder, single episode, unspecified: Secondary | ICD-10-CM | POA: Diagnosis not present

## 2019-06-10 DIAGNOSIS — F332 Major depressive disorder, recurrent severe without psychotic features: Secondary | ICD-10-CM

## 2019-06-10 DIAGNOSIS — F4310 Post-traumatic stress disorder, unspecified: Secondary | ICD-10-CM

## 2019-06-10 DIAGNOSIS — F411 Generalized anxiety disorder: Secondary | ICD-10-CM

## 2019-06-10 NOTE — Progress Notes (Signed)
Virtual Visit via Video Note   I connected with Mark [name] on 06/10/19 at 9:00 AM EST by a video enabled telemedicine application and verified that I am speaking with the correct person using two identifiers.   Location: Patient: Patient Home Provider: OPT BH Office   Case Manager discussed the limitations of evaluation and management by telemedicine and the availability of in person appointments during orientation. The patient expressed understanding and agreed to proceed.   History of Present Illness: MDD, GAD, PTSD    Observations/Objective: Case Manager checked in with all participants to review discharge dates, insurance authorizations, work-related documents and needs for the treatment team. Counselor facilitated a check-in with group members to gauge mood and current functioning as well as identify recent progress towards treatment goals.  [name] presented to session on time and was alert, oriented x5, with no evidence or self-report of SI/HI or A/V H.  He reported scores of 8/10 for depression, 4/10 for anxiety, and 8/10 for pain today.  [name] reported that he had to leave group session early yesterday because he was in too much pain to continue engagement.  He reported that although his pain level today was also high, he intended to try and 'tough it out' as long as possible.  [name] reported that he did note that his mood was improved this morning compared to the day before, stating "I woke up different today".  He attributed this to possibly being more well rested, as he did sleep better the night before.  [name] reported that he would take it easy today, and his only plan is to make lasagna, which he will share with a friend that is grieving the loss of a dog.    Psycho-educational portion of group was provided by pharmacist, Peggye Fothergill. Pharmacist provided psychoeducation on classes of medications such as antidepressants, antipsychotics, what symptoms they are intended to treat, and any side  effects one might encounter while on prescription.  Time was allowed for clients to ask any questions they might have of pharmacist.  [name] participated in this portion of group by voicing his concerns about current medication regimen and inquiring about whether there would be a way to research negative drug interactions when combined due to changes that have been made recently and growing concerns.  He was encouraged to voice concerns with medical providers, and [name] promised to provide case manager with additional resources to share with [name] once she has time to research this more.  [name] also expressed some frustration towards length of time that medications take to become effective in symptom reduction.  [name] was appreciative for clarity on these questions.    Counselor introduced topic of grounding skills today.  Counselor defined these as simple strategies one can use to help detach from difficult thoughts or feelings temporarily by focusing on something else.  Counselor noted that grounding will not solve the problem at hand, but can provide the practitioner with time to regain control over their thoughts and/or feelings and prevent the situation from getting worse (i.e. interrupting a panic attack).  Counselor divided these into three categories (mental, physical, and soothing) and then provided examples of each which group members could practice during session.  Some of these included describing one's environment in detail or playing a categories game with oneself for mental category, taking a hot bath/shower, stretching, or carrying a grounding object for physical category, and saying kind statements, or visualizing people one cares about for soothing category.  Counselor inquired about  which techniques members have used with success in the past, or will commit to learning, practicing, and applying now to improve coping abilities.    Interventions were effective, as evidenced by [name] participating in  discussion on the subject and trying out some of the grounding exercises that were covered.  [name] reported that he is trying to be more open minded during treatment and give anything a chance that might help him, so one thing he plans to do is buy a [employer], political party which he can use throughout the day to stay grounded.  He also reported that he finds it helpful to take hot showers at times to calm down, as well as use humor to take the edge off difficult situations.  [name] was also able to practice 5-4-3-2-1 grounding technique in session today and noted that it could be helpful if he commits it to memory.    Counselor ended session by acknowledging a graduating group member by prompting graduating member to reflect on progress made, takeaways from treatment and plan for stepping down. Counselor and group members shared observations of growth, encouragement and support as they transition out of the program.  [name] participated in this portion of group by encouraging exiting member to continue with their self-care plan and remember to call on support system for assistance if any problems arise.    Assessment and Plan: Counselor recommends that patient remains in IOP treatment to better manage mental health symptoms and continue to address treatment plan goals. Counselor recommends adherence to crisis/safety plan, taking medications as prescribed and following up with medical professionals if any issues arise.    Follow Up Instructions: Counselor will send Webex link for next session.  The patient was advised to call back or seek an in-person evaluation if the symptoms worsen or if the condition fails to improve as anticipated.   I provided 180 minutes of non-face-to-face time during this encounter.     [name], LCSW, LCAS

## 2019-06-10 NOTE — Progress Notes (Signed)
Virtual Visit via Video Note  I connected with Mark [name] on 06/10/19 at  9:00 AM EDT by a video enabled telemedicine application and verified that I am speaking with the correct person using two identifiers.   Case Manager discussed the limitations of evaluation and management by telemedicine and the availability of in person appointments. The patient expressed understanding and agreed to proceed.  Location:  Patient: Patient Home Provider: [facility]  History of Present Illness: MDD, GAD, PTSD  Observations/Objective: Case Manager checked in with all participants to review discharge dates, insurance authorizations, work-related documents and needs for the treatment team. Clinician facilitated a check-in with group members to assess mood and current functioning. Clinician introduced self and prompted client to provide an update on functioning since last group session. Client checked in and reported continued increase of pain from a recent surgical procedure. Client reported frustration with tasks regarding his health that is is unable to do and feels unsupported by the agency which provides his home health care. Client engaged in discussion on means in which he can appropriately advocate for his own needs with "looking like I'm complaining". Client was receptive to feedback from peers. Client did not endorse SI/HI/psychosis during this engagement.   Client did not return from break and was not present for psycho-educational portion of group.  Assessment and Plan: Clinician recommends that patient remains in IOP treatment to better manage mental health symptoms and continue to address treatment plan goals. Clinician recommends adherence to crisis/safety plan, taking medications as prescribed, and following up with medical professionals if any issues arise.  Follow Up Instructions: Clinician will send Webex link for next session. The patient was advised to call back or seek an in-person  evaluation if the symptoms worsen or if the condition fails to improve as anticipated.  I provided 90 minutes of non-face-to-face time during this encounter.   Francine Graven, LCSW

## 2019-06-11 ENCOUNTER — Ambulatory Visit (HOSPITAL_COMMUNITY): Payer: Medicare HMO | Admitting: Licensed Clinical Social Worker

## 2019-06-11 ENCOUNTER — Other Ambulatory Visit (HOSPITAL_COMMUNITY): Payer: No Typology Code available for payment source | Admitting: Licensed Clinical Social Worker

## 2019-06-11 ENCOUNTER — Telehealth (HOSPITAL_COMMUNITY): Payer: Self-pay | Admitting: Psychiatry

## 2019-06-11 ENCOUNTER — Other Ambulatory Visit: Payer: Self-pay

## 2019-06-11 ENCOUNTER — Other Ambulatory Visit (HOSPITAL_COMMUNITY): Payer: No Typology Code available for payment source

## 2019-06-11 DIAGNOSIS — F329 Major depressive disorder, single episode, unspecified: Secondary | ICD-10-CM | POA: Diagnosis not present

## 2019-06-11 DIAGNOSIS — F332 Major depressive disorder, recurrent severe without psychotic features: Secondary | ICD-10-CM

## 2019-06-11 DIAGNOSIS — F4310 Post-traumatic stress disorder, unspecified: Secondary | ICD-10-CM

## 2019-06-11 DIAGNOSIS — R4589 Other symptoms and signs involving emotional state: Secondary | ICD-10-CM

## 2019-06-11 DIAGNOSIS — F411 Generalized anxiety disorder: Secondary | ICD-10-CM

## 2019-06-12 ENCOUNTER — Other Ambulatory Visit: Payer: Self-pay

## 2019-06-12 ENCOUNTER — Other Ambulatory Visit (HOSPITAL_COMMUNITY): Payer: No Typology Code available for payment source | Admitting: Licensed Clinical Social Worker

## 2019-06-12 DIAGNOSIS — F329 Major depressive disorder, single episode, unspecified: Secondary | ICD-10-CM | POA: Diagnosis not present

## 2019-06-12 DIAGNOSIS — F411 Generalized anxiety disorder: Secondary | ICD-10-CM

## 2019-06-12 DIAGNOSIS — F332 Major depressive disorder, recurrent severe without psychotic features: Secondary | ICD-10-CM

## 2019-06-12 DIAGNOSIS — F4310 Post-traumatic stress disorder, unspecified: Secondary | ICD-10-CM

## 2019-06-12 NOTE — Progress Notes (Signed)
Virtual Visit via Video Note   I connected with [name] on 06/12/19 at 9:00 AM EST by a video enabled telemedicine application and verified that I am speaking with the correct person using two identifiers.   Location: Patient: Patient Home Provider: [facility]   Case Manager discussed the limitations of evaluation and management by telemedicine and the availability of in person appointments during orientation. The patient expressed understanding and agreed to proceed.   History of Present Illness: MDD, GAD, PTSD    Observations/Objective: Case Manager checked in with all participants to review discharge dates, insurance authorizations, work-related documents and needs for the treatment team. Counselor facilitated a check-in with group members to gauge mood and current functioning as well as identify recent progress towards treatment goals.  Apolo presented to session on time and was alert, oriented x5, with no evidence or self-report of SI/HI or A/V H.  He reported scores of 2/10 for depression, 6/10 for anxiety, and 7/10 for pain today.  Terin reported that he was feeling anxious today because he has a doctor's appointment later today to have some cysts looked at, and he is worries about pain increasing after this encounter.  Caliber was receptive to feedback from peers and noted that this was the lowest depression he has felt since beginning treatment, and he feels that focusing on gratitude more as been helpful in therapy process.    Counselor provided psychoeducation on personal boundaries today.  Counselor provided worksheet to members on this subject which explained how boundaries can be considered limits and rules we set for ourselves in relationships, and involves the ability to say "No" to unreasonable demands. Suggestions for improving personal boundaries were discussed, including how to use confident body language, being respectful to others, planning ahead regarding how to approach  difficult conversations, and learning to reach a compromise when possible.  Members were encouraged to consider which traits they most often display, and discussion centered upon how this impacts interactions with supports, and whether changes could be made to improve relationships.  Members were also given opportunities to practice responses they could provide when faced with difficult situations (i.e. being asked to do something at a late hour when inconvenient, having a nosy coworker ask personal questions about work leave, etc).  Interventions were effective, as evidenced by [name] participating in discussion on the subject and reporting that he has very rigid boundaries overall with many people in his life.  Jakwan opened up about how some of his values include being truthful and honest, but people around him have violated his trust on numerous occasions, so now he avoids intimacy and close relationships.  Zayde reported that he oftentimes feels "Torn between being more open minded and closed off", but group has been helpful for showing him that he can meet people with healthy boundaries, and he hopes to continue exploring this issue more during individual therapy.  Goerge was able to practice providing effectively assertive responses to roleplay exercise in group today to work towards improving boundaries.    Assessment and Plan: Counselor recommends that patient remains in IOP treatment to better manage mental health symptoms and continue to address treatment plan goals. Counselor recommends adherence to crisis/safety plan, taking medications as prescribed and following up with medical professionals if any issues arise.    Follow Up Instructions: Counselor will send Webex link for next session.  The patient was advised to call back or seek an in-person evaluation if the symptoms worsen or if the  condition fails to improve as anticipated.   I provided 180 minutes of non-face-to-face time during this  encounter.     [name], LCSW, LCAS

## 2019-06-15 ENCOUNTER — Encounter (HOSPITAL_COMMUNITY): Payer: Self-pay | Admitting: Licensed Clinical Social Worker

## 2019-06-15 ENCOUNTER — Other Ambulatory Visit (HOSPITAL_COMMUNITY): Payer: No Typology Code available for payment source | Admitting: Licensed Clinical Social Worker

## 2019-06-15 ENCOUNTER — Other Ambulatory Visit: Payer: Self-pay

## 2019-06-15 ENCOUNTER — Ambulatory Visit (HOSPITAL_COMMUNITY): Payer: No Typology Code available for payment source | Admitting: Licensed Clinical Social Worker

## 2019-06-15 DIAGNOSIS — F411 Generalized anxiety disorder: Secondary | ICD-10-CM

## 2019-06-15 DIAGNOSIS — F4310 Post-traumatic stress disorder, unspecified: Secondary | ICD-10-CM

## 2019-06-15 DIAGNOSIS — F329 Major depressive disorder, single episode, unspecified: Secondary | ICD-10-CM | POA: Diagnosis not present

## 2019-06-15 DIAGNOSIS — F332 Major depressive disorder, recurrent severe without psychotic features: Secondary | ICD-10-CM

## 2019-06-15 NOTE — Progress Notes (Signed)
Virtual Visit via Video Note   I connected with Mark [name] on 06/15/19 at 9:20 AM EST by a video enabled telemedicine application and verified that I am speaking with the correct person using two identifiers.   Location: Patient: Patient Home Provider: [facility]   Case Manager discussed the limitations of evaluation and management by telemedicine and the availability of in person appointments during orientation. The patient expressed understanding and agreed to proceed.   History of Present Illness: MDD, GAD, PTSD    Observations/Objective: Case Manager checked in with all participants to review discharge dates, insurance authorizations, work-related documents and needs for the treatment team. Counselor facilitated a check-in with group members to gauge mood and current functioning as well as identify recent progress towards treatment goals.  Mark [name] presented to session 20 minutes late and apologized for tardiness, noting that he overslept and didn't hear his alarm.  He was alert, oriented x5, with no evidence or self-report of SI/HI or A/V H.  He reported scores of 2/10 for depression, and 5/10 for anxiety.  Mark [name] reported that his pain level isn't as bad today, but he worries about starting to have nightmares again, as he has begun dreaming more vividly lately.  Mark [name] reported that to resolve this, he plans to start working more on improving sleep hygiene each night.  He reported that he spent the weekend socializing with family, watched a ballgame, and today he will prepare for an upcoming trip, and has an appointment with his nurse later in the afternoon.      Counselor covered topic of core beliefs with group today.  Counselor provided handout on the subject, which explained how everyone looks at the world differently, and two people can have the same experience, but have different interpretations of what happened.  Members were encouraged to think of these like sunglasses with  different "shades" influencing perception towards positive or negative outcomes.  Examples of negative core beliefs were provided, such as "I'm unlovable", "I'm not good enough", and "I'm a bad person".  Members were asked to identify which one(s) they could relate to, and then consider evidence which contradicts these beliefs.  Mark [name] acknowledged that he does have a few negative core beliefs that he needs to be mindful of, and reported that these include the idea that he is unlovable, that he is broken, or that things will not get better.  Mark [name] reported that he is beginning to realize that he is not broken, but instead facing difficulties that he can overcome with time, and pointed to recent improvements in mood as evidence to contradict these beliefs.  Mark [name] reported that "It could always be worse", and noted that humor, along with engaging with people in group has helped to disrupt this negative thought cycle more easily.    Counselor also provided psychoeducation on positive affirmations today.  Counselor explained how these are positive statements which can be spoken out loud or recited mentally to challenge negative thoughts and/or core beliefs to improve mood and outlook each day.  Counselor provided a comprehensive list of affirmations to members with different categories, including ones for health, confidence, success, and happiness.  Counselor invited members to look through this list and identify any which resonated with them, and practice saying them out loud with sincerity.  Intervention was effective, as evidenced by Mark [name] participating in discussion and noting that he found several of these affirmations to be appealing, so he intends to use "This too shall pass" and "I'm not  trying to fit in because I was born to stand out" in daily practice to work towards resolving negative core beliefs.      Assessment and Plan: Counselor recommends that patient remains in IOP treatment to better manage mental  health symptoms and continue to address treatment plan goals. Counselor recommends adherence to crisis/safety plan, taking medications as prescribed and following up with medical professionals if any issues arise.    Follow Up Instructions: Counselor will send Webex link for next session.  The patient was advised to call back or seek an in-person evaluation if the symptoms worsen or if the condition fails to improve as anticipated.   I provided 160 minutes of non-face-to-face time during this encounter.     Mark Flood, LCSW, LCAS

## 2019-06-15 NOTE — Progress Notes (Signed)
Virtual Visit via Video Note  I connected with Mark [name] on 06/15/19 at 5:00 pm EDT by a video enabled telemedicine application and verified that I am speaking with the correct person using two identifiers.   I discussed the limitations of evaluation and management by telemedicine and the availability of in person appointments. The patient expressed understanding and agreed to proceed.  LOCATION: Patient: home Provider: home  History of Present Illness: [facility], depression, anxiety.    Observations/Objective:Patient participated in a discussion on family dysfunction. Clinician assessed patient's family dynamics and discussed how the dynamics influence mental health symptoms in a negative way. Patient was encouraged to work on improve family function.    Assessment and Plan: Counselor will continue to meet with patient to address treatment plan goals. Patient will continue to follow recommendations of providers and implement skills learned in session.   Follow Up Instructions: I discussed the assessment and treatment plan with the patient. The patient was provided an opportunity to ask questions and all were answered. The patient agreed with the plan and demonstrated an understanding of the instructions.   The patient was advised to call back or seek an in-person evaluation if the symptoms worsen or if the condition fails to improve as anticipated.  I provided 120 minutes of non-face-to-face time during this encounter.   Chequita Mofield S, LCAS

## 2019-06-15 NOTE — Progress Notes (Signed)
Virtual Visit via Video Note  I connected with Mark [name] on 06/11/2019 at  9:00 AM EDT by a video enabled telemedicine application and verified that I am speaking with the correct person using two identifiers.   Case Manager discussed the limitations of evaluation and management by telemedicine and the availability of in person appointments. The patient expressed understanding and agreed to proceed.  Location:  Patient: Patient Home Provider: [facility]  History of Present Illness: MDD, GAD, PTSD  Observations/Objective: Case Manager checked in with all participants to review discharge dates, insurance authorizations, work-related documents and needs for the treatment team. Clinician facilitated a check-in with group members to assess mood and current functioning. Clinician introduced self and prompted client to provide an update on functioning since last group meeting. Client was late in joining group today and reported continued pain due to a recent surgical procedure. Client relayed that he has attempted to reach out to medical providers overseeing his care however hasn't been satisfied with their response or actions in meeting his needs. Client participated by providing support and feedback in relating to peers' identified experiences. Client did not endorse SI/HI/psychosis during this engagement.  Psycho-educational portion of group centered on identifying and relaying needs to others in order to feel supported. Clinician introduced topic and assessed member's ability to relay their needs to others and any potential barriers in doing so. Clinician prompted discussion on the value in relaying needs to others to improve relationships and mood. Clinician shared video titled 'What is Your Love Language' and encouraged members to share their responses. Clinician shared a Love Language quiz both virtually and a handout to group members. Clinician encouraged members to take quiz and then share  responses. Clinician utilized open ended questions and CBT to challenge member's thoughts regarding their responses as well as how they can inform others on how to best support them. Client presented as resistant to today's activities in reporting that he has no desire to attempt to meet others needs or show support because "I need to focus on myself". Client admitted that this view may be unhelpful as he also relayed that he wants others to show love or offer support to him but doesn't feel that he wants to reciprocate. Client did take the love language quiz and reported his love language as acts of services however relayed that he only wants others to provide acts of service to him if "it's done my way". Client was receptive to feedback and actively engaged in discussion throughout today's group session.  Assessment and Plan: Clinician recommends that patient remains in IOP treatment to better manage mental health symptoms and continue to address treatment plan goals. Clinician recommends adherence to crisis/safety plan, taking medications as prescribed, and following up with medical professionals if any issues arise.  Follow Up Instructions: Clinician will send Webex link for next session. The patient was advised to call back or seek an in-person evaluation if the symptoms worsen or if the condition fails to improve as anticipated.   I provided 90 minutes of non-face-to-face time during this encounter.   Francine Graven, LCSW

## 2019-06-16 ENCOUNTER — Other Ambulatory Visit: Payer: Self-pay

## 2019-06-16 ENCOUNTER — Telehealth (HOSPITAL_COMMUNITY): Payer: No Typology Code available for payment source | Admitting: Psychiatry

## 2019-06-16 ENCOUNTER — Ambulatory Visit (HOSPITAL_COMMUNITY): Payer: No Typology Code available for payment source

## 2019-06-16 ENCOUNTER — Other Ambulatory Visit (HOSPITAL_COMMUNITY): Payer: No Typology Code available for payment source | Admitting: Licensed Clinical Social Worker

## 2019-06-16 DIAGNOSIS — F4310 Post-traumatic stress disorder, unspecified: Secondary | ICD-10-CM

## 2019-06-16 DIAGNOSIS — F332 Major depressive disorder, recurrent severe without psychotic features: Secondary | ICD-10-CM

## 2019-06-16 DIAGNOSIS — F329 Major depressive disorder, single episode, unspecified: Secondary | ICD-10-CM | POA: Diagnosis not present

## 2019-06-16 DIAGNOSIS — F411 Generalized anxiety disorder: Secondary | ICD-10-CM

## 2019-06-16 NOTE — Progress Notes (Signed)
Virtual Visit via Video Note  I connected with Mark [name] on 06/16/19 at  9:00 AM EDT by a video enabled telemedicine application and verified that I am speaking with the correct person using two identifiers.   Case Manager discussed the limitations of evaluation and management by telemedicine and the availability of in person appointments. The patient expressed understanding and agreed to proceed.  Location:  Patient: Patient Home Provider: [facility]  History of Present Illness: MDD, GAD, PTSD  Observations/Objective: Case Manager checked in with all participants to review discharge dates, insurance authorizations, work-related documents and needs for the treatment team. Clinician facilitated a check-in with group members to assess mood and current functioning. Clinician introduced self and prompted client to provide an update on functioning since last group meeting. Clinician shared guided meditation 'Clear Your Mind' and encouraged members to engage in mindfulness as well as provide feedback on their experience. Client was active throughout check in by providing an update reporting that he his pain has reduced lately which has helped him to experience an improved mood. Client reported "I'm feeling better than I was yesterday" but made incongruent statements regarding feeling better but denying making any progress. Client did not endorse SI/HI/psychosis during this engagement.  Psycho-educational portion of group centered on Cognitive Distortions and unhealthy thinking patterns. Clinician provided oversight on distorted thinking patters and shared video 'Cognitive Distortion's and allowed space for members to share their responses. Clinician provided psycho-educational handouts titled 'Cognitive Distortions' and 'Challenging Negative Thoughts' and prompted members to identify any distorted thinking patterns that they engage in while processing feelings associated with those thoughts as well  as behaviors. Clinician validated members feelings and engaged in discussion on the importance in recognizing these thoughts in order to make change. Client reported feeling "amazed at reading this" and engaged in discussion and asking clarifying questions related to psycho-educational handouts. Client reported experiencing emotional reasoning predominantly and compared other material reviewed in previous groups 'HALT' and how this impacts thoughts, feelings, bx.  Assessment and Plan: Clinician recommends that patient remains in IOP treatment to better manage mental health symptoms and continue to address treatment plan goals. Clinician recommends adherence to crisis/safety plan, taking medications as prescribed, and following up with medical professionals if any issues arise.  Follow Up Instructions: Clinician will send Webex link for next session. The patient was advised to call back or seek an in-person evaluation if the symptoms worsen or if the condition fails to improve as anticipated.   I provided 180 minutes of non-face-to-face time during this encounter.   Francine Graven, LCSW

## 2019-06-17 ENCOUNTER — Other Ambulatory Visit: Payer: Self-pay

## 2019-06-17 ENCOUNTER — Ambulatory Visit (HOSPITAL_COMMUNITY): Payer: No Typology Code available for payment source

## 2019-06-17 ENCOUNTER — Other Ambulatory Visit (HOSPITAL_COMMUNITY): Payer: No Typology Code available for payment source | Admitting: Licensed Clinical Social Worker

## 2019-06-17 DIAGNOSIS — F332 Major depressive disorder, recurrent severe without psychotic features: Secondary | ICD-10-CM

## 2019-06-17 DIAGNOSIS — F329 Major depressive disorder, single episode, unspecified: Secondary | ICD-10-CM | POA: Diagnosis not present

## 2019-06-17 DIAGNOSIS — F411 Generalized anxiety disorder: Secondary | ICD-10-CM

## 2019-06-17 DIAGNOSIS — F4310 Post-traumatic stress disorder, unspecified: Secondary | ICD-10-CM

## 2019-06-17 NOTE — Progress Notes (Signed)
Virtual Visit via Video Note   I connected with Mark [name] on 06/17/19 at 9:55 AM EST by a video enabled telemedicine application and verified that I am speaking with the correct person using two identifiers.   Location: Patient: Patient Home Provider: [facility]   Case Manager discussed the limitations of evaluation and management by telemedicine and the availability of in person appointments during orientation. The patient expressed understanding and agreed to proceed.   History of Present Illness: MDD, GAD, PTSD    Observations/Objective: Case Manager checked in with all participants to review discharge dates, insurance authorizations, work-related documents and needs for the treatment team. Counselor facilitated a check-in with group members to gauge mood and current functioning as well as identify recent progress towards treatment goals.  Mark [name] presented to session 55 minutes late today and reported that he was tardy due to struggling to find email invitation to meeting today.  He presented as alert, oriented x5, with no evidence or self-report of SI/HI or A/V H.  He reported scores of 7/10 for depression, and 4-5/10 for anxiety.  Mark [name] reported that he was slightly frustrated yesterday because he forgot about some pork chops he had left out, and today would be his deceased mother's birthday, so reflecting upon this increased his depression.  Mark [name] reported that his plan today is to continue thinking rationally and optimistically to avoid this negatively impacting his mood too much.  He reported that he would be productive by putting away winter clothing.    Counselor provided demonstration of relaxation technique known as mindful breathing to help members increase sense of calm, resiliency, and control.  Counselor guided members through process of getting comfortable, achieving a relaxed breathing rhythm, and focusing on this for several minutes, allowing troubling thoughts and feelings to  come and go without rumination.  Counselor processed effectiveness of activity afterward in discussion with members, including how this impacted their mental state, whether it was difficult to stay focused, and if they plan to include it in self-care routine to improve day-to-day coping.  Intervention was effective, as evidenced by Mark [name] participating in meditation activity and noting that although he has historically struggled with this type of exercise, he remains motivated to try new things that might benefit his mental health and overall wellbeing.  He reported that he was unable to focus on his breathing, but did his best to follow along.    Psycho-educational portion of group was co-facilitated by wellness director (Mark Stall, MS, MPH, [facility]) focused on self-care in daily life. Facilitator and group members discussed presented materials regarding importance of sleep, diet, and exercise. Group members discussed any changes they are willing to make to improve an area of self-care in their lives (physical, psychological, emotional, spiritual, relationship, professional) to improve overall mental health as they continue with treatment.  Mark [name] followed along with presentation and reported that one aspect of self-care that is important to him is exercise, and he regularly goes to a local gym despite limitations of his disability.  Mark [name] reported that he has a goal of increasing his bicep size so that he can improve confidence and get a family crest that he designed tattooed on his arm.  He reported that he is grateful to have a 'judgment free gym' to go to, acknowledging that this has been a barrier in the past.    Assessment and Plan: Counselor recommends that patient remains in IOP treatment to better manage mental health symptoms and continue to address treatment  plan goals. Counselor recommends adherence to crisis/safety plan, taking medications as prescribed and following up with medical professionals if any  issues arise.    Follow Up Instructions: Counselor will send Webex link for next session.  The patient was advised to call back or seek an in-person evaluation if the symptoms worsen or if the condition fails to improve as anticipated.   I provided 125 minutes of non-face-to-face time during this encounter.     [name], LCSW, LCAS

## 2019-06-18 ENCOUNTER — Other Ambulatory Visit (HOSPITAL_COMMUNITY): Payer: No Typology Code available for payment source | Admitting: Licensed Clinical Social Worker

## 2019-06-18 ENCOUNTER — Other Ambulatory Visit: Payer: Self-pay

## 2019-06-18 ENCOUNTER — Ambulatory Visit (HOSPITAL_COMMUNITY): Payer: Medicare HMO | Admitting: Licensed Clinical Social Worker

## 2019-06-18 DIAGNOSIS — F411 Generalized anxiety disorder: Secondary | ICD-10-CM

## 2019-06-18 DIAGNOSIS — F4310 Post-traumatic stress disorder, unspecified: Secondary | ICD-10-CM

## 2019-06-18 DIAGNOSIS — F329 Major depressive disorder, single episode, unspecified: Secondary | ICD-10-CM | POA: Diagnosis not present

## 2019-06-18 DIAGNOSIS — F332 Major depressive disorder, recurrent severe without psychotic features: Secondary | ICD-10-CM

## 2019-06-19 ENCOUNTER — Other Ambulatory Visit: Payer: Self-pay

## 2019-06-19 ENCOUNTER — Ambulatory Visit (HOSPITAL_COMMUNITY): Payer: No Typology Code available for payment source

## 2019-06-19 ENCOUNTER — Other Ambulatory Visit (HOSPITAL_BASED_OUTPATIENT_CLINIC_OR_DEPARTMENT_OTHER): Payer: No Typology Code available for payment source | Admitting: Psychiatry

## 2019-06-19 DIAGNOSIS — F332 Major depressive disorder, recurrent severe without psychotic features: Secondary | ICD-10-CM

## 2019-06-19 DIAGNOSIS — F411 Generalized anxiety disorder: Secondary | ICD-10-CM

## 2019-06-19 DIAGNOSIS — F329 Major depressive disorder, single episode, unspecified: Secondary | ICD-10-CM | POA: Diagnosis not present

## 2019-06-19 NOTE — Progress Notes (Signed)
Virtual Visit via Video Note  I connected with Mark [name] on 06/19/19 at  9:00 AM EDT by a video enabled telemedicine application and verified that I am speaking with the correct person using two identifiers.   I discussed the limitations of evaluation and management by telemedicine and the availability of in person appointments. The patient expressed understanding and agreed to proceed.   I discussed the assessment and treatment plan with the patient. The patient was provided an opportunity to ask questions and all were answered. The patient agreed with the plan and demonstrated an understanding of the instructions.   The patient was advised to call back or seek an in-person evaluation if the symptoms worsen or if the condition fails to improve as anticipated.  I provided 15 minutes of non-face-to-face time during this encounter.   [name], NP   [facility]ensive [facility] Program Discharge Summary  Len Azeez [id number]  Admission date: 06/02/2019 Discharge date: 06/19/2019  Reason for admission: Per admission assessment note: Mark Ratliffis a 59 y.o. African Americanmale presents with worsening Depression related to PTSD symptoms.Patient reports he recently discharged from inpatient admission from the [place]. Due to worsening suicidal thoughts. Reports he did not feel as if his antidepressants and anxiety medications was very effective. States he is unsure of which medication he was titrated off of however states he was prescribed 21 medication however he is now taking 8 pills only. Continues to report anxiety 9 out of 10 with 10 being the worst. Reports his children continue to be supportive. Patient has completed partial hospitalization programming in the past. 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  Reported strained relationship between he and his children however continues to report working on the relationships.   Progress in Program Toward Treatment Goals: Ongoing, patient attended and participated with daily group session with active and engaged participation.  Prior to attending intensive outpatient programming patient reported surgical procedure which caused chronic pain.  Patient continued to need reassurance and encouragement daily.  Patient recently completed partial hospitalization programming.  Patient to keep all outpatient follow-up appointments.  Consider utilizing [facility] resources as well.  Patient denied suicidal or homicidal ideations.  Denies auditory or visual hallucinations.  Notable change in behavior and mood since completing partial hospitalization programming due to chroinc pain and medications he reported was ineffective.   Progress (rationale): Patient to keep follow-up with psychiatrist Arfeen and therapist [facility].  Take all medications as prescribed. Keep all follow-up appointments as scheduled.  Do not consume alcohol or use illegal drugs while on prescription medications. Report any adverse effects from your medications to your primary care provider promptly.  In the event of recurrent symptoms or worsening symptoms, call 911, a crisis hotline, or go to the nearest emergency department for evaluation.    [name], NP 06/19/2019

## 2019-06-19 NOTE — Patient Instructions (Addendum)
D:  Patient successfully completed MH-IOP today.  A:  Discharge.  Follow up with Idalia Needle, LCAS on 06-25-19 @ 2pm and Dr. Lolly Mustache on 07-06-19 @ 2pm.  Encouraged support groups; [facility] aftercare group, and also mentioned [facility] aftercare group.  R:  Patient receptive.

## 2019-06-19 NOTE — Progress Notes (Signed)
Virtual Visit via Video Note   I connected with [name] on 06/19/19 at 9:00 AM EST by a video enabled telemedicine application and verified that I am speaking with the correct person using two identifiers.   Location: Patient: Patient Home Provider: OPT BH Office   Case Manager discussed the limitations of evaluation and management by telemedicine and the availability of in person appointments during orientation. The patient expressed understanding and agreed to proceed.   History of Present Illness: MDD, GAD, PTSD    Observations/Objective: Case Manager checked in with all participants to review discharge dates, insurance authorizations, work-related documents and needs for the treatment team. Counselor facilitated a check-in with group members to gauge mood and current functioning as well as identify recent progress towards treatment goals.  Emmaus presented to session on time and was alert, oriented x5, with no evidence or self-report of SI/HI or A/V H.  He reported scores of 2/10 for depression, 7/10 for anxiety, and 8/10 for pain today.  Valente reported that he did not do much the day before besides make a nice meal and put up dishes, but acknowledged that he is trying to pace himself and avoid overdoing it.  Tedrick reported that his plan today is to visit the doctor, so some grocery shopping, and then relax at home yesterday.  Hartford reported that he has been worrying about this appointment, since his wound still hasn't healed as he expected, and has been hurting a lot.    Counselor introduced topic of self-esteem today and defined this as the value an individual places on oneself, based upon assessment of personal worth as a human being and approval/disapproval of one's behavior. Counselor asked members to assess their level of self-esteem at this time based upon common indicators of high self-esteem, including: accepting oneself unconditionally;  having self-respect and deep seated belief that  one matters; being unaffected by other people's opinions/criticisms; and showing good control over emotions.  Counselor also explained concept of one's inner critic which serves to highlight faults and minimize strengths, directly influencing low sense of self-esteem.  Keyon reported that he perceives self-esteem as feeling competent at something in particular, and being able to potentially fail at something the first time, but then gradually get better with skill-building and practice.  He reported that he has moderate confidence in himself and his abilities, which is how he has managed to deal with his disability and press on, even on days when he is in great levels of pain.   Counselor then provided handout on 'strengths and qualities', which featured questions to guide discussion and increase awareness of each member's unique individual abilities which could reinforce higher self-esteem. Examples of questions included: 'things I am good at', 'challenges I have overcome', and 'what I like about myself'.   Artrell reported that he plans to work on Nurse, adult by Arboriculturist and affirmations he can recite each day to believe in himself more, including "I am confident in myself, and can overcome any challenge", as well as "We are human and make mistakes".    Counselor ended session by acknowledging a graduating group member by prompting graduating member to reflect on progress made, takeaways from treatment and plan for stepping down. Counselor and group members shared observations of growth, encouragement and support as they transition out of the program. [name] participated in completion activity by sharing how he feels that he has been able to develop a "Roadmap for myself" through group engagement, and is now actively  working to understand cognitive distortions more, the link between his thoughts and feelings, and how to slow down his thinking process to intervene appropriately.  Nikola expressed  appreciation to the group, stating "This is the best one I've ever been in, and gave me new perspective on a lot of things".  He encouraged remaining members to keep a positive attitude, listen, and press on.  Camara reported that he needed to leave group 30 minutes early to attend his doctor's appointment.    Assessment and Plan: Counselor recommends that patient remains in IOP treatment to better manage mental health symptoms and continue to address treatment plan goals. Counselor recommends adherence to crisis/safety plan, taking medications as prescribed and following up with medical professionals if any issues arise.    Follow Up Instructions: Counselor will send Webex link for next session.  The patient was advised to call back or seek an in-person evaluation if the symptoms worsen or if the condition fails to improve as anticipated.   I provided 150 minutes of non-face-to-face time during this encounter.     [name], LCSW, LCAS

## 2019-06-20 ENCOUNTER — Encounter (HOSPITAL_COMMUNITY): Payer: Self-pay | Admitting: Psychiatry

## 2019-06-22 ENCOUNTER — Ambulatory Visit (HOSPITAL_COMMUNITY): Payer: Medicare HMO | Admitting: Licensed Clinical Social Worker

## 2019-06-22 NOTE — Progress Notes (Signed)
Virtual Visit via Video Note  I connected with [name] on @TODAY @ at  9:00 AM EDT by a video enabled telemedicine application and verified that I am speaking with the correct person using two identifiers.   I discussed the limitations of evaluation and management by telemedicine and the availability of in person appointments. The patient expressed understanding and agreed to proceed.  I provided 20 minutes of non-face-to-face time during this encounter.   Patient ID: Mark [name], male   DOB: May 04, 1960, 59 y.o.   MRN: [id number] As per previous CCA: Pt reports to PHP per recommendation by individual therapist, [id number]. Pt shares his depression and anxiety have increased since stay at home orders for COVID-19. Pt shares that "not being able to go anywhere and the lack of human interaction" has really affected him. 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 Pt denies HI/AVH   Pt's therapist [facility], LCAS) referred pt to MH-IOP, treatment for ongoing depressive and anxiety symptoms.  Pt voices being in pain from a recent surgery.  Current stressors:  1) family conflict 2) financial strain 3) health issues ) lack of support. Pt admits to passive SI.  Denies a plan or intent.  Able to contract for safety.  Denies HI or A/V hallucinations.   Pt attended all scheduled days.  States he is feeling ready for discharge.  "I feel alright with knowing that I will still have some issues even though today is my last day."  Reports that he still struggles with high anxiety;  but states he learned a lot in the groups.  "There was a great mix of people and I got a lot of worksheets."  Pt mentioned that he signed up for a [facility] group.  On a scale 1-10 (10 being the worst); pt rated his depression #2 and anxiety #7.  Denies SI/HI or A/V hallucinations. Pt keeps inquiring about an ADHD test with Dr. Thea Silversmith.  Re-directed pt back to Dr. Lolly Mustache to inquire during his next appointment.  A:  D/C 06-19-19.  Encouraged support groups through [facility], [facility] aftercare group, along with MH-IOP's aftercare group.  F/U with 04-23-1985, LCAS on 06-25-19 @ 2pm and Dr. 02-25-1996 on 07-06-19 @ 2pm.  R:  Pt receptive.   04-11-1996, M.Ed,CNA

## 2019-06-23 NOTE — Progress Notes (Addendum)
Virtual Visit via Video Note  I connected with Mark [name] on 06/18/19 at  9:00 AM EDT by a video enabled telemedicine application and verified that I am speaking with the correct person using two identifiers.   Case Manager discussed the limitations of evaluation and management by telemedicine and the availability of in person appointments. The patient expressed understanding and agreed to proceed.  Location:  Patient: Patient Home Provider: BH OPT Office  History of Present Illness: MDD, GAD, PTSD  Observations/Objective: Case Manager checked in with all participants to review discharge dates, insurance authorizations, work-related documents and needs for the treatment team. Clinician facilitated a check-in with group members to assess mood and current functioning. Clinician introduced self and prompted client to provide an update on functioning since last group session. Client checked in by providing an update and reported "I'm mad". Client discussed overall frustration and anger that has built up over time and his difficulty in managing this. Client admitted that his affect is typically incongruent as he presents as happy and laughing but processed underlying feelings of anger. Client provided feedback to a peer and provided insight into his perspective of being a father and setting harsh rules or expectations related to what his children can and can't do. Client was receptive to challenge from the group in allowing healthy boundaries between parents and adult children. Client did not endorse any current SI/HI/psychosis.  Initial portion of group was provided by [facility]. Chaplain introduced self her role with joining the IOP group 1x weekly. Chaplain provided education on various types of grief such as anticipatory and disenfranchised grief. Chaplain provided information related to stages of grief and encouraged members to share their personal experiences with  grief/loss.   Second portion of group discussion was provided by [facility] with [facility]. Alex introduced self her role with joining the IOP group to provide psycho-education on specific services and programs offered within [employer]. Alex allowed space for members to ask questions and inquire on specific programs that they are interested in. Clinician followed up with members following Alex's presentation and facilitated discussion on which programs they plan to look into or inquire on. Clinician assessed for 1 self-care activity prior to tomorrow's group. Client reported that he plans to take care of himself today by going grocery shopping.  Assessment and Plan: Clinician recommends that patient remains in IOP treatment to better manage mental health symptoms and continue to address treatment plan goals. Clinician recommends adherence to crisis/safety plan, taking medications as prescribed, and following up with medical professionals if any issues arise.  Follow Up Instructions: Clinician will send Webex link for next session. The patient was advised to call back or seek an in-person evaluation if the symptoms worsen or if the condition fails to improve as anticipated.   I provided 180 minutes of non-face-to-face time during this encounter.   Francine Graven, LCSW

## 2019-06-25 ENCOUNTER — Encounter (HOSPITAL_COMMUNITY): Payer: Self-pay | Admitting: Licensed Clinical Social Worker

## 2019-06-25 ENCOUNTER — Other Ambulatory Visit: Payer: Self-pay

## 2019-06-25 ENCOUNTER — Ambulatory Visit (INDEPENDENT_AMBULATORY_CARE_PROVIDER_SITE_OTHER): Payer: No Typology Code available for payment source | Admitting: Licensed Clinical Social Worker

## 2019-06-25 DIAGNOSIS — F411 Generalized anxiety disorder: Secondary | ICD-10-CM

## 2019-06-25 DIAGNOSIS — F4310 Post-traumatic stress disorder, unspecified: Secondary | ICD-10-CM | POA: Diagnosis not present

## 2019-06-25 DIAGNOSIS — F332 Major depressive disorder, recurrent severe without psychotic features: Secondary | ICD-10-CM | POA: Diagnosis not present

## 2019-06-25 NOTE — Progress Notes (Signed)
Virtual Visit via Video Note  I connected with Mark [name] on 06/25/19 at 2:00 pm EDT by video enabled telemedicine with the correct person using two identifiers.   I discussed the limitations of evaluation and management by telemedicine and the availability of in person appointments. The patient expressed understanding and agreed to proceed.  History of Present Illness: Pt was referred to [facility] OP therapy for PTSD, depression and anxiety by the [facility].     Observations/Objective: Verbalize positive and hopeful feelings of the future.  Patient presents depressed and in pain. He discussed his psychiatric symptoms and current life events. Today is the first session after completion of IOP. Asked open ended questions about his takeaways from IOP. Patient discussed his current stressors: Family, $, health issues. Patient described his feelings about the continued lack of his family involvement during his surgery and aftercare.Clinician utilized MI OARS to help patient reflect and summarize his thoughts and feelings Cln again suggested using boundaries with his family.  Assessment and Plan: Counselor will continue to meet with patient to address treatment plan goals. Patient will continue to follow recommendations of providers and implement skills learned in session.  Follow Up Instructions: Counselor will send webex invitation weekly before each individual and group session.   I discussed the assessment and treatment plan with the patient. The patient was provided an opportunity to ask questions and all were answered. The patient agreed with the plan and demonstrated an understanding of the instructions.   The patient was advised to call back or seek an in-person evaluation if the symptoms worsen or if the condition fails to improve as anticipated.  I provided 60 minutes of non-face-to-face time during this encounter.   Cinque Begley S, LCAS  .

## 2019-06-29 ENCOUNTER — Ambulatory Visit (INDEPENDENT_AMBULATORY_CARE_PROVIDER_SITE_OTHER): Payer: No Typology Code available for payment source | Admitting: Licensed Clinical Social Worker

## 2019-06-29 ENCOUNTER — Other Ambulatory Visit: Payer: Self-pay

## 2019-06-29 ENCOUNTER — Encounter (HOSPITAL_COMMUNITY): Payer: Self-pay | Admitting: Licensed Clinical Social Worker

## 2019-06-29 DIAGNOSIS — F411 Generalized anxiety disorder: Secondary | ICD-10-CM | POA: Diagnosis not present

## 2019-06-29 DIAGNOSIS — F4310 Post-traumatic stress disorder, unspecified: Secondary | ICD-10-CM | POA: Diagnosis not present

## 2019-06-29 DIAGNOSIS — F332 Major depressive disorder, recurrent severe without psychotic features: Secondary | ICD-10-CM

## 2019-06-29 NOTE — Psych (Signed)
Virtual Visit via Video Note  I connected with Mark [name] on 04/03/19 at  9:00 AM EST by a video enabled telemedicine application and verified that I am speaking with the correct person using two identifiers.   I discussed the limitations of evaluation and management by telemedicine and the availability of in person appointments. The patient expressed understanding and agreed to proceed.   I discussed the assessment and treatment plan with the patient. The patient was provided an opportunity to ask questions and all were answered. The patient agreed with the plan and demonstrated an understanding of the instructions.   The patient was advised to call back or seek an in-person evaluation if the symptoms worsen or if the condition fails to improve as anticipated.  Pt was provided 240 minutes of non-face-to-face time during this encounter.   Mark [name], [facility]    [facility] PHP THERAPIST PROGRESS NOTE  Mark [name] [id number]  Session Time: 9:00 - 10:00  Participation Level: Active  Behavioral Response: CasualAlertDepressed  Type of Therapy: Group Therapy  Treatment Goals addressed: Coping  Interventions: CBT, DBT, Supportive and Reframing  Summary: Clinician led check-in regarding current stressors and situation, and review of patient completed daily inventory. Clinician utilized active listening and empathetic response and validated patient emotions. Clinician facilitated processing group on pertinent issues  Therapist Response: Mark [name] is a 59 y.o. male who presents with depression and anxiety symptoms. Patient arrived within time allowed and reports that he is feeling "overwhelmed." Patient rates his mood at a 6 on a scale of 1-10 with 10 being great. Pt reports a rough afternoon due to not accomplishing everything he wanted to get done. Pt shares he reports he continues to struggle with beating himself up over past events/actions. Pt able to process. Pt engaged in  discussion.           Session Time: 10:00 -11:00   Participation Level: Active   Behavioral Response: CasualAlertDepressed   Type of Therapy: Group Therapy   Treatment Goals addressed: Coping   Interventions: CBT, DBT, Solution Focused, Supportive and Reframing   Summary: Cln introduced topic of ruminations and cognitive distortions. Cln discusses coping skills that can be used to stop negative thinking.     Therapist Response: Pt engaged in discussion. Pt reports check the facts is helpful for them.       Session Time: 11:00- 12:00   Participation Level: Active   Behavioral Response: CasualAlertDepressed   Type of Therapy: Group Therapy, OT   Treatment Goals addressed: Coping   Interventions: Psychosocial skills training, Supportive   Summary: Occupational Therapy group   Therapist Response: Patient engaged in group. See OT note.            Session Time: 12:00 -1:00   Participation Level: Active   Behavioral Response: CasualAlertDepressed   Type of Therapy: Group therapy   Treatment Goals addressed: Coping   Interventions: CBT; Solution focused; Supportive; Reframing   Summary: 12:00 - 12:50: Clinician introduced topic of "Positive Psychology". Group watched "Positive Psychology" Ted-Talk. Patients discussed how their "lens" of life effects the way they feel. Group discussed 5 strategies to help change lens. Patients identified one strategy they would be willing to try to change their "lens" for at least 21 days to create a new habit.  12:50 -1:00 Clinician led check-out. Clinician assessed for immediate needs, medication compliance and efficacy, and safety concerns   Therapist Response: 12:00 - 12:50: Pt engaged in discussion and reports wanting to try positive  journaling at bedtime.  12:50 - 1:00: At check-out, patient rates his mood at an 8 on a scale of 1-10 with 10 being great. Pt states afternoon plans of packing boxes and cleaning up. Patient  demonstrates some progress as evidenced by increased ability to use skills to manage symptoms. Patient denies SI/HI/self-harm at the end of group.     Suicidal/Homicidal: Nowithout intent/plan   Plan: Pt will continue in PHP while working to increase stabilization post-hospital discharge, decrease depression and anxiety symptoms, and increase ability to manage symptoms in a healthy manner.   Diagnosis: Major depressive disorder, recurrent episode, moderate (HCC) [F33.1]    1. Major depressive disorder, recurrent episode, moderate ([place])   2. PTSD (post-traumatic stress disorder)   3. Generalized anxiety disorder       Mark [name], [facility] 04/03/2019

## 2019-06-29 NOTE — Progress Notes (Signed)
Virtual Visit via Video Note  I connected with Mark [name] on 06/29/19 at 5:00 pm EDT by a video enabled telemedicine application and verified that I am speaking with the correct person using two identifiers.   I discussed the limitations of evaluation and management by telemedicine and the availability of in person appointments. The patient expressed understanding and agreed to proceed.  LOCATION: Patient: home Provider: home  History of Present Illness: [facility], [facility], [facility]    Observations/Objective:Patient participated in a discussion on Memorial Day celebrations: honoring and mourning fallen soldiers, putting flags on fellow veteran's graves, attending [employer], rendering respect of fellow soldiers. Clinician honored each veteran in this group for their respective service in the [employer].    Assessment and Plan: Counselor will continue to meet with patient to address treatment plan goals. Patient will continue to follow recommendations of providers and implement skills learned in session.   Follow Up Instructions: I discussed the assessment and treatment plan with the patient. The patient was provided an opportunity to ask questions and all were answered. The patient agreed with the plan and demonstrated an understanding of the instructions.   The patient was advised to call back or seek an in-person evaluation if the symptoms worsen or if the condition fails to improve as anticipated.  I provided 120 minutes of non-face-to-face time during this encounter.   Herbert Aguinaldo S, LCAS

## 2019-07-02 ENCOUNTER — Encounter (HOSPITAL_COMMUNITY): Payer: Self-pay | Admitting: Licensed Clinical Social Worker

## 2019-07-02 ENCOUNTER — Ambulatory Visit (INDEPENDENT_AMBULATORY_CARE_PROVIDER_SITE_OTHER): Payer: No Typology Code available for payment source | Admitting: Licensed Clinical Social Worker

## 2019-07-02 ENCOUNTER — Other Ambulatory Visit: Payer: Self-pay

## 2019-07-02 DIAGNOSIS — F332 Major depressive disorder, recurrent severe without psychotic features: Secondary | ICD-10-CM

## 2019-07-02 DIAGNOSIS — F4310 Post-traumatic stress disorder, unspecified: Secondary | ICD-10-CM

## 2019-07-02 DIAGNOSIS — F411 Generalized anxiety disorder: Secondary | ICD-10-CM | POA: Diagnosis not present

## 2019-07-02 NOTE — Progress Notes (Signed)
Virtual Visit via Video Note  I connected with Mark [name] on 07/02/19 at 2:00 pm EDT by video enabled telemedicine with the correct person using two identifiers.   I discussed the limitations of evaluation and management by telemedicine and the availability of in person appointments. The patient expressed understanding and agreed to proceed.  LOCATION: Patient: home Provider: home    History of Present Illness: Pt was referred to [facility] OP therapy for PTSD, depression and anxiety by the [facility].     Observations/Objective: Verbalize positive and hopeful feelings of the future.  Patient presents angry and frustrated for his virtual therapy session. "i'm in less pain, finally, I had surgery 5 weeks ago."  Patient discussed his current stressors: Family, $, health issues. Used CBT to assist patient learning a different way of thinking about his stressors. Suggested patient use the skills he learned in IOP to assist with his current stressors. Practiced mindfulness skills in session with patient.     Assessment and Plan: Counselor will continue to meet with patient to address treatment plan goals. Patient will continue to follow recommendations of providers and implement skills learned in session.  Follow Up Instructions: I discussed the assessment and treatment plan with the patient. The patient was provided an opportunity to ask questions and all were answered. The patient agreed with the plan and demonstrated an understanding of the instructions.   The patient was advised to call back or seek an in-person evaluation if the symptoms worsen or if the condition fails to improve as anticipated.  I provided 60 minutes of non-face-to-face time during this encounter.   Roshana Shuffield S, LCAS  .

## 2019-07-06 ENCOUNTER — Encounter (HOSPITAL_COMMUNITY): Payer: Self-pay | Admitting: Licensed Clinical Social Worker

## 2019-07-06 ENCOUNTER — Encounter (HOSPITAL_COMMUNITY): Payer: Self-pay | Admitting: Psychiatry

## 2019-07-06 ENCOUNTER — Other Ambulatory Visit: Payer: Self-pay

## 2019-07-06 ENCOUNTER — Ambulatory Visit (INDEPENDENT_AMBULATORY_CARE_PROVIDER_SITE_OTHER): Payer: No Typology Code available for payment source | Admitting: Licensed Clinical Social Worker

## 2019-07-06 ENCOUNTER — Telehealth (INDEPENDENT_AMBULATORY_CARE_PROVIDER_SITE_OTHER): Payer: No Typology Code available for payment source | Admitting: Psychiatry

## 2019-07-06 ENCOUNTER — Telehealth (HOSPITAL_COMMUNITY): Payer: Self-pay | Admitting: *Deleted

## 2019-07-06 DIAGNOSIS — F331 Major depressive disorder, recurrent, moderate: Secondary | ICD-10-CM | POA: Diagnosis not present

## 2019-07-06 DIAGNOSIS — F4310 Post-traumatic stress disorder, unspecified: Secondary | ICD-10-CM

## 2019-07-06 DIAGNOSIS — F332 Major depressive disorder, recurrent severe without psychotic features: Secondary | ICD-10-CM

## 2019-07-06 DIAGNOSIS — F419 Anxiety disorder, unspecified: Secondary | ICD-10-CM

## 2019-07-06 DIAGNOSIS — F411 Generalized anxiety disorder: Secondary | ICD-10-CM

## 2019-07-06 MED ORDER — BUSPIRONE HCL 15 MG PO TABS: 15.0000 mg | ORAL_TABLET | Freq: Three times a day (TID) | ORAL | 1 refills | Status: DC

## 2019-07-06 MED ORDER — PRAZOSIN HCL 2 MG PO CAPS: 2.0000 mg | ORAL_CAPSULE | Freq: Every day | ORAL | 1 refills | Status: DC

## 2019-07-06 MED ORDER — BUPROPION HCL ER (XL) 300 MG PO TB24: 300.0000 mg | ORAL_TABLET | Freq: Every day | ORAL | 1 refills | Status: DC

## 2019-07-06 MED ORDER — TRAZODONE HCL 50 MG PO TABS: 50.0000 mg | ORAL_TABLET | Freq: Every evening | ORAL | 1 refills | Status: DC | PRN

## 2019-07-06 NOTE — Progress Notes (Signed)
Virtual Visit via Video Note  I connected with Mark [name] on 07/06/19 at 5:00 pm EDT by a video enabled telemedicine application and verified that I am speaking with the correct person using two identifiers.   I discussed the limitations of evaluation and management by telemedicine and the availability of in person appointments. The patient expressed understanding and agreed to proceed.  LOCATION: Patient: home Provider: home  History of Present Illness: [facility]    Observation/Objective: Patient participated in a discussion on introspection, looking inward, self-examination, reflection, and inspection of thoughts. Patient described self-reflection to describe his reflective thoughts. Patient was encouraged to continue using introspection.      Assessment and Plan: Counselor will continue to meet with patient to address treatment plan goals. Patient will continue to follow recommendations of providers and implement skills learned in session.   Follow Up Instructions: I discussed the assessment and treatment plan with the patient. The patient was provided an opportunity to ask questions and all were answered. The patient agreed with the plan and demonstrated an understanding of the instructions.   The patient was advised to call back or seek an in-person evaluation if the symptoms worsen or if the condition fails to improve as anticipated.  I provided 60 minutes of non-face-to-face time during this encounter.   Tiney Zipper S, LCAS

## 2019-07-06 NOTE — Progress Notes (Signed)
Virtual Visit via Telephone Note  I connected with [name] on 07/06/19 at  2:00 PM EDT by telephone and verified that I am speaking with the correct person using two identifiers.   I discussed the limitations, risks, security and privacy concerns of performing an evaluation and management service by telephone and the availability of in person appointments. I also discussed with the patient that there may be a patient responsible charge related to this service. The patient expressed understanding and agreed to proceed.   Patient location; home Provider location; home office  History of Present Illness: Patient is evaluated by phone session.  He recently finished IOP.  He told that he is now getting medication from our office and he stopped getting psych medication from the [place].  He has cut down a lot of medication.  I reconcile the medicine from him and he told that he is no longer taking Lexapro, muscle relaxant, modafinil and did not remember picking up the Abilify which was started on the last visit.  He is a still taking trazodone, Minipress, Wellbutrin and BuSpar.  His dose was increased to 15 mg BuSpar 3 times a day by physician at [facility].  He notices anxiety is better.  He is also using CPAP machine every day which is helping his sleep.  He feels the therapy did work very well and he like to continue individual therapy in the future.  Today he also correct the dates of his inpatient.  In the past he mentioned that he was admitted in November and now he mentioned that he was admitted at [facility] inpatient in February and then did PHP in March and IOP recently in May.  He feels his depression is better and he has no longer suicidal thoughts.  He is still struggling with nightmares and flashbacks but he did like Minipress which helps him a lot.  He has been a struggle with attention and focus and sometimes he has to write things as he tends to forget.  He lives with his daughter.  He has no tremors or  shakes.  He denies any crying spells and he is more motivated to do things.   Past Psychiatric History:Reviewed. H/O depression and PTSD. H/O inpatient at [facility] in [place] 2020. No suicidal attempt.  Prozac and Effexor worked for while. Took Cymbalta, gabapentin, Lamictal, lexapro and hydroxyzine in past . H/O multiple PHP.   Psychiatric Specialty Exam: Physical Exam  Review of Systems  There were no vitals taken for this visit.There is no height or weight on file to calculate BMI.  General Appearance: NA  Eye Contact:  NA  Speech:  Slow  Volume:  Normal  Mood:  Euthymic  Affect:  NA  Thought Process:  Goal Directed  Orientation:  Full (Time, Place, and Person)  Thought Content:  Rumination  Suicidal Thoughts:  No  Homicidal Thoughts:  No  Memory:  Immediate;   Fair Recent;   Fair Remote;   Fair  Judgement:  Intact  Insight:  Present  Psychomotor Activity:  NA  Concentration:  Concentration: Fair and Attention Span: Fair  Recall:  [employer] of Knowledge:  Good  Language:  Good  Akathisia:  No  Handed:  Right  AIMS (if indicated):     Assets:  Communication Skills Desire for Improvement Housing Resilience  ADL's:  Intact  Cognition:  WNL  Sleep:   8 hrs      Assessment and Plan: Major depressive disorder, recurrent.  PTSD.  Anxiety.  I review notes from group therapy.  I also reconciled her current medicine.  I explained that he should stay with one physician for his medication refills rather than multiple provider writing psychiatric medication.  He agreed with the plan.  He decided not to get any help from the [place] and he like to stay with [name].  Since he never picked up the Abilify I will discontinue Abilify.  Overall he is doing better since he cut down a lot of medication.  He is no longer taking modafinil, hydroxyzine.  I will continue Wellbutrin XL 300 mg daily, BuSpar 15 mg 3 times a day, continue Minipress 2 mg at bedtime and trazodone 50 mg at bedtime.  Encouraged to  continue therapy with Charolotte Eke.  I recommend to call us back if is any question or any concern.  We will provide a printout of his current medication so he can take it to the [place] and provide them the current list of medication.  Follow-up in 6 weeks.  Time spent 20 minutes.  Follow Up Instructions:    I discussed the assessment and treatment plan with the patient. The patient was provided an opportunity to ask questions and all were answered. The patient agreed with the plan and demonstrated an understanding of the instructions.   The patient was advised to call back or seek an in-person evaluation if the symptoms worsen or if the condition fails to improve as anticipated.  I provided 20 minutes of non-face-to-face time during this encounter.   Kathlee Nations, MD

## 2019-07-06 NOTE — Telephone Encounter (Deleted)
Writer left VM for pt informing him that orders have been sent to [facility] @ [address].

## 2019-07-09 ENCOUNTER — Encounter (HOSPITAL_COMMUNITY): Payer: Self-pay | Admitting: Licensed Clinical Social Worker

## 2019-07-09 ENCOUNTER — Ambulatory Visit (INDEPENDENT_AMBULATORY_CARE_PROVIDER_SITE_OTHER): Payer: No Typology Code available for payment source | Admitting: Licensed Clinical Social Worker

## 2019-07-09 ENCOUNTER — Other Ambulatory Visit: Payer: Self-pay

## 2019-07-09 DIAGNOSIS — F331 Major depressive disorder, recurrent, moderate: Secondary | ICD-10-CM

## 2019-07-09 DIAGNOSIS — F4310 Post-traumatic stress disorder, unspecified: Secondary | ICD-10-CM | POA: Diagnosis not present

## 2019-07-09 DIAGNOSIS — F419 Anxiety disorder, unspecified: Secondary | ICD-10-CM | POA: Diagnosis not present

## 2019-07-09 NOTE — Progress Notes (Signed)
Virtual Visit via Video Note  I connected with Mark [name] on 07/09/19 at 2:00 pm EDT by video enabled telemedicine with the correct person using two identifiers.   I discussed the limitations of evaluation and management by telemedicine and the availability of in person appointments. The patient expressed understanding and agreed to proceed.  LOCATION: Patient: home Provider: home    History of Present Illness: Pt was referred to [facility] OP therapy for PTSD, depression and anxiety by the [facility].     Observations/Objective: Verbalize positive and hopeful feelings of the future.  Patient presents angry and frustrated for his virtual therapy session. Patient discuses his continued stressors: family, $ and health. Discussed retraumatization with patient. Cln provided psychoeducation on retraumatization, identifying triggers, what coping skills work, tips for mitigating retraumatization. Patient is going to begin IOP at the [place], for PTSD on 6/28.   Assessment and Plan: Counselor will continue to meet with patient to address treatment plan goals. Patient will continue to follow recommendations of providers and implement skills learned in session.  Follow Up Instructions: I discussed the assessment and treatment plan with the patient. The patient was provided an opportunity to ask questions and all were answered. The patient agreed with the plan and demonstrated an understanding of the instructions.   The patient was advised to call back or seek an in-person evaluation if the symptoms worsen or if the condition fails to improve as anticipated.  I provided 60 minutes of non-face-to-face time during this encounter.   Kriti Katayama S, LCAS  .

## 2019-07-13 ENCOUNTER — Other Ambulatory Visit: Payer: Self-pay

## 2019-07-13 ENCOUNTER — Ambulatory Visit (INDEPENDENT_AMBULATORY_CARE_PROVIDER_SITE_OTHER): Payer: No Typology Code available for payment source | Admitting: Licensed Clinical Social Worker

## 2019-07-13 ENCOUNTER — Encounter (HOSPITAL_COMMUNITY): Payer: Self-pay | Admitting: Licensed Clinical Social Worker

## 2019-07-13 DIAGNOSIS — F4310 Post-traumatic stress disorder, unspecified: Secondary | ICD-10-CM

## 2019-07-13 DIAGNOSIS — F331 Major depressive disorder, recurrent, moderate: Secondary | ICD-10-CM | POA: Diagnosis not present

## 2019-07-13 DIAGNOSIS — F419 Anxiety disorder, unspecified: Secondary | ICD-10-CM | POA: Diagnosis not present

## 2019-07-13 NOTE — Progress Notes (Signed)
Virtual Visit via Video Note  I connected with Mark [name] on 07/13/19 at 5:00 pm EDT by a video enabled telemedicine application and verified that I am speaking with the correct person using two identifiers.   I discussed the limitations of evaluation and management by telemedicine and the availability of in person appointments. The patient expressed understanding and agreed to proceed.  LOCATION: Patient: home Provider: home  History of Present Illness: [facility], [facility], [facility].    Observation/Objective:  Patient participated in a discussion on bitterness, which happens when you feel there is no action left to take because everything is out of your control.  Patient was encouraged to take action to move forward in life and away from bitterness.  Assessment and Plan: Counselor will continue to meet with patient to address treatment plan goals. Patient will continue to follow recommendations of providers and implement skills learned in session.   Follow Up Instructions: I discussed the assessment and treatment plan with the patient. The patient was provided an opportunity to ask questions and all were answered. The patient agreed with the plan and demonstrated an understanding of the instructions.   The patient was advised to call back or seek an in-person evaluation if the symptoms worsen or if the condition fails to improve as anticipated.  I provided 30 minutes of non-face-to-face time during this encounter.   Danny Yackley S, LCAS

## 2019-07-16 ENCOUNTER — Ambulatory Visit (HOSPITAL_COMMUNITY): Payer: No Typology Code available for payment source | Admitting: Licensed Clinical Social Worker

## 2019-07-16 ENCOUNTER — Other Ambulatory Visit: Payer: Self-pay

## 2019-07-20 ENCOUNTER — Ambulatory Visit (HOSPITAL_COMMUNITY): Payer: Medicare HMO | Admitting: Licensed Clinical Social Worker

## 2019-07-20 ENCOUNTER — Other Ambulatory Visit: Payer: Self-pay

## 2019-07-23 ENCOUNTER — Encounter (HOSPITAL_COMMUNITY): Payer: Self-pay | Admitting: Licensed Clinical Social Worker

## 2019-07-23 ENCOUNTER — Other Ambulatory Visit: Payer: Self-pay

## 2019-07-23 ENCOUNTER — Ambulatory Visit (INDEPENDENT_AMBULATORY_CARE_PROVIDER_SITE_OTHER): Payer: No Typology Code available for payment source | Admitting: Licensed Clinical Social Worker

## 2019-07-23 DIAGNOSIS — F4310 Post-traumatic stress disorder, unspecified: Secondary | ICD-10-CM

## 2019-07-23 DIAGNOSIS — F419 Anxiety disorder, unspecified: Secondary | ICD-10-CM

## 2019-07-23 DIAGNOSIS — F331 Major depressive disorder, recurrent, moderate: Secondary | ICD-10-CM | POA: Diagnosis not present

## 2019-07-23 NOTE — Progress Notes (Signed)
Virtual Visit via Video Note  I connected with Mark [name] on 07/23/19 at 2:00 pm EDT by video enabled telemedicine with the correct person using two identifiers.   I discussed the limitations of evaluation and management by telemedicine and the availability of in person appointments. The patient expressed understanding and agreed to proceed.  LOCATION: Patient: home Provider: home    History of Present Illness: Pt was referred to [facility] OP therapy for PTSD, depression and anxiety by the [facility].     Observations/Objective: Verbalize positive and hopeful feelings of the future.   Patient presented anxious for his individual virtual therapy session. Patient discussed her psychiatric symptoms and current life events. "i've had frustrating [facility] this [facility]" Cln provided psychoeducation on improving his low frustration tolerance." Explored with patient preparing for the fireworks this weekend celebrating July 4th. Patient began IOP at the [place], for PTSD. Explored new program with patient.   Assessment and Plan: Counselor will continue to meet with patient to address treatment plan goals. Patient will continue to follow recommendations of providers and implement skills learned in session.  Follow Up Instructions: I discussed the assessment and treatment plan with the patient. The patient was provided an opportunity to ask questions and all were answered. The patient agreed with the plan and demonstrated an understanding of the instructions.   The patient was advised to call back or seek an in-person evaluation if the symptoms worsen or if the condition fails to improve as anticipated.  I provided 60 minutes of non-face-to-face time during this encounter.   Malayja Freund S, LCAS  .

## 2019-07-27 ENCOUNTER — Ambulatory Visit (HOSPITAL_COMMUNITY): Payer: Medicare HMO | Admitting: Licensed Clinical Social Worker

## 2019-07-30 ENCOUNTER — Other Ambulatory Visit: Payer: Self-pay

## 2019-07-30 ENCOUNTER — Encounter (HOSPITAL_COMMUNITY): Payer: Self-pay | Admitting: Licensed Clinical Social Worker

## 2019-07-30 ENCOUNTER — Ambulatory Visit (INDEPENDENT_AMBULATORY_CARE_PROVIDER_SITE_OTHER): Payer: No Typology Code available for payment source | Admitting: Licensed Clinical Social Worker

## 2019-07-30 DIAGNOSIS — F331 Major depressive disorder, recurrent, moderate: Secondary | ICD-10-CM | POA: Diagnosis not present

## 2019-07-30 DIAGNOSIS — F4310 Post-traumatic stress disorder, unspecified: Secondary | ICD-10-CM | POA: Diagnosis not present

## 2019-07-30 DIAGNOSIS — F419 Anxiety disorder, unspecified: Secondary | ICD-10-CM | POA: Diagnosis not present

## 2019-08-03 ENCOUNTER — Ambulatory Visit (INDEPENDENT_AMBULATORY_CARE_PROVIDER_SITE_OTHER): Payer: No Typology Code available for payment source | Admitting: Licensed Clinical Social Worker

## 2019-08-03 ENCOUNTER — Other Ambulatory Visit: Payer: Self-pay

## 2019-08-03 ENCOUNTER — Encounter (HOSPITAL_COMMUNITY): Payer: Self-pay | Admitting: Licensed Clinical Social Worker

## 2019-08-03 DIAGNOSIS — F331 Major depressive disorder, recurrent, moderate: Secondary | ICD-10-CM | POA: Diagnosis not present

## 2019-08-03 DIAGNOSIS — F419 Anxiety disorder, unspecified: Secondary | ICD-10-CM

## 2019-08-03 DIAGNOSIS — F4310 Post-traumatic stress disorder, unspecified: Secondary | ICD-10-CM | POA: Diagnosis not present

## 2019-08-03 NOTE — Progress Notes (Signed)
Virtual Visit via Video Note  I connected with [name] on 08/03/19 at 5:00 pm EDT by a video enabled telemedicine application and verified that I am speaking with the correct person using two identifiers.   I discussed the limitations of evaluation and management by telemedicine and the availability of in person appointments. The patient expressed understanding and agreed to proceed.  LOCATION: Patient: home Provider: home  History of Present Illness: [facility], [facility], [facility].    Observation/Objective:  Patient participated in a discussion on family relationships, what's important: communication, listening to each other, communicating with openness and honesty, sharing similar values which creates a sense of belonging and bonding. Patient was encouraged to continue to grow within his family relationships.     Assessment and Plan: Counselor will continue to meet with patient to address treatment plan goals. Patient will continue to follow recommendations of providers and implement skills learned in session.   Follow Up Instructions: I discussed the assessment and treatment plan with the patient. The patient was provided an opportunity to ask questions and all were answered. The patient agreed with the plan and demonstrated an understanding of the instructions.   The patient was advised to call back or seek an in-person evaluation if the symptoms worsen or if the condition fails to improve as anticipated.  I provided 120 minutes of non-face-to-face time during this encounter.   Aleese Kamps S, LCAS

## 2019-08-03 NOTE — Progress Notes (Signed)
Virtual Visit via Video Note  I connected with Mark [name] on 07/30/19 at 2:00 pm EDT by video enabled telemedicine with the correct person using two identifiers.   I discussed the limitations of evaluation and management by telemedicine and the availability of in person appointments. The patient expressed understanding and agreed to proceed.  LOCATION: Patient: home Provider: home    History of Present Illness: Pt was referred to [facility] OP therapy for PTSD, depression and anxiety by the [facility].     Observations/Objective: Verbalize positive and hopeful feelings of the future.   Patient presented depressed and anxious for his individual virtual therapy session. Patient discussed her psychiatric symptoms and current life events. "I've been frustrated, depressed and anxious this week." Patient identified his stressors this past week: trying to find a job, job interviews, family relationships, disappointments, inability to stabilize moods, continuously dealing with the [place].  Clinician explored his triggers and effective coping skills to assist in him feeling better. However, he reports some episodic insecurity and depressed mood, especially surrounding his family. Clinician utilized CBT to reflect with him and help him summarize thoughts and feelings. Clinician explored triggers and coping skills in order to assist in feeling better. Clinician processed triggers and their effects on his mood and behaviors    Assessment and Plan: Counselor will continue to meet with patient to address treatment plan goals. Patient will continue to follow recommendations of providers and implement skills learned in session.  Follow Up Instructions: I discussed the assessment and treatment plan with the patient. The patient was provided an opportunity to ask questions and all were answered. The patient agreed with the plan and demonstrated an understanding of the instructions.   The patient was advised to  call back or seek an in-person evaluation if the symptoms worsen or if the condition fails to improve as anticipated.  I provided 60 minutes of non-face-to-face time during this encounter.   Sami Froh S, LCAS  .

## 2019-08-06 ENCOUNTER — Ambulatory Visit (HOSPITAL_COMMUNITY): Payer: No Typology Code available for payment source | Admitting: Licensed Clinical Social Worker

## 2019-08-10 ENCOUNTER — Other Ambulatory Visit: Payer: Self-pay

## 2019-08-10 ENCOUNTER — Ambulatory Visit (INDEPENDENT_AMBULATORY_CARE_PROVIDER_SITE_OTHER): Payer: No Typology Code available for payment source | Admitting: Licensed Clinical Social Worker

## 2019-08-10 ENCOUNTER — Encounter (HOSPITAL_COMMUNITY): Payer: Self-pay | Admitting: Licensed Clinical Social Worker

## 2019-08-10 DIAGNOSIS — F331 Major depressive disorder, recurrent, moderate: Secondary | ICD-10-CM

## 2019-08-10 DIAGNOSIS — F419 Anxiety disorder, unspecified: Secondary | ICD-10-CM

## 2019-08-10 DIAGNOSIS — F4310 Post-traumatic stress disorder, unspecified: Secondary | ICD-10-CM

## 2019-08-10 NOTE — Progress Notes (Signed)
Virtual Visit via Video Note  I connected with [name] on 08/10/19 at 5:00 pm EDT by a video enabled telemedicine application and verified that I am speaking with the correct person using two identifiers.   I discussed the limitations of evaluation and management by telemedicine and the availability of in person appointments. The patient expressed understanding and agreed to proceed.  LOCATION: Patient: home Provider: home  History of Present Illness: [facility], [facility].    Observation/Objective:   Patient participated in a discussion on believing in yourself, having faith in your own capabilities. When you believe in yourself, you can overcome self-doubt and have the confidence to take action and get things done.  Patient was encouraged to continue believing in himself.     Assessment and Plan: Counselor will continue to meet with patient to address treatment plan goals. Patient will continue to follow recommendations of providers and implement skills learned in session.   Follow Up Instructions: I discussed the assessment and treatment plan with the patient. The patient was provided an opportunity to ask questions and all were answered. The patient agreed with the plan and demonstrated an understanding of the instructions.   The patient was advised to call back or seek an in-person evaluation if the symptoms worsen or if the condition fails to improve as anticipated.  I provided 120 minutes of non-face-to-face time during this encounter.   Seymour Pavlak S, LCAS

## 2019-08-13 ENCOUNTER — Ambulatory Visit (INDEPENDENT_AMBULATORY_CARE_PROVIDER_SITE_OTHER): Payer: No Typology Code available for payment source | Admitting: Licensed Clinical Social Worker

## 2019-08-13 ENCOUNTER — Encounter (HOSPITAL_COMMUNITY): Payer: Self-pay | Admitting: Licensed Clinical Social Worker

## 2019-08-13 ENCOUNTER — Other Ambulatory Visit: Payer: Self-pay

## 2019-08-13 DIAGNOSIS — F419 Anxiety disorder, unspecified: Secondary | ICD-10-CM | POA: Diagnosis not present

## 2019-08-13 DIAGNOSIS — F4310 Post-traumatic stress disorder, unspecified: Secondary | ICD-10-CM | POA: Diagnosis not present

## 2019-08-13 DIAGNOSIS — F331 Major depressive disorder, recurrent, moderate: Secondary | ICD-10-CM

## 2019-08-13 NOTE — Progress Notes (Signed)
Virtual Visit via Video Note  I connected with Mark [name] on 08/13/19 at 2:00 pm EDT by video enabled telemedicine with the correct person using two identifiers.   I discussed the limitations of evaluation and management by telemedicine and the availability of in person appointments. The patient expressed understanding and agreed to proceed.  LOCATION: Patient: home Provider: home    History of Present Illness: Pt was referred to [facility] OP therapy for PTSD, depression and anxiety by the [facility].     Observations/Objective: Verbalize positive and hopeful feelings of the future.   Patient presented anxious for his individual virtual therapy session. Patient discussed his psychiatric symptoms and current life events. Patient identified his stressors:: trying to find a job, job interviews, family relationships, disappointments, inability to stabilize moods continuously. Cln explored his triggers and effective coping skills to assist in stabilizing moods. Used CBT to assist patient in his focus on challenging and changing cognitive distortions and behaviors, improving emotional regulation, and the development of personal coping strategies that target solving current problems.       Assessment and Plan: Counselor will continue to meet with patient to address treatment plan goals. Patient will continue to follow recommendations of providers and implement skills learned in session.  Follow Up Instructions: I discussed the assessment and treatment plan with the patient. The patient was provided an opportunity to ask questions and all were answered. The patient agreed with the plan and demonstrated an understanding of the instructions.   The patient was advised to call back or seek an in-person evaluation if the symptoms worsen or if the condition fails to improve as anticipated.  I provided 60 minutes of non-face-to-face time during this encounter.   Amica Harron S,  LCAS  .

## 2019-08-17 ENCOUNTER — Encounter (HOSPITAL_COMMUNITY): Payer: Self-pay | Admitting: Licensed Clinical Social Worker

## 2019-08-17 ENCOUNTER — Ambulatory Visit (INDEPENDENT_AMBULATORY_CARE_PROVIDER_SITE_OTHER): Payer: No Typology Code available for payment source | Admitting: Licensed Clinical Social Worker

## 2019-08-17 ENCOUNTER — Other Ambulatory Visit: Payer: Self-pay

## 2019-08-17 DIAGNOSIS — F331 Major depressive disorder, recurrent, moderate: Secondary | ICD-10-CM

## 2019-08-17 DIAGNOSIS — F4310 Post-traumatic stress disorder, unspecified: Secondary | ICD-10-CM

## 2019-08-17 DIAGNOSIS — F419 Anxiety disorder, unspecified: Secondary | ICD-10-CM

## 2019-08-17 NOTE — Progress Notes (Signed)
Virtual Visit via Video Note  I connected with [name] on 08/17/19 at 5:00 pm EDT by a video enabled telemedicine application and verified that I am speaking with the correct person using two identifiers.   I discussed the limitations of evaluation and management by telemedicine and the availability of in person appointments. The patient expressed understanding and agreed to proceed.  LOCATION: Patient: home Provider: home  History of Present Illness: [facility], [facility], [facility].    Observation/Objective:   Patient participated in a discussion on making healthy habits. Clinician provided psychoeducation on making healthy choices to make patients feel better and live longer. Patient was encouraged to make healthy habits.    Assessment and Plan: Counselor will continue to meet with patient to address treatment plan goals. Patient will continue to follow recommendations of providers and implement skills learned in session.   Follow Up Instructions: I discussed the assessment and treatment plan with the patient. The patient was provided an opportunity to ask questions and all were answered. The patient agreed with the plan and demonstrated an understanding of the instructions.   The patient was advised to call back or seek an in-person evaluation if the symptoms worsen or if the condition fails to improve as anticipated.  I provided 120 minutes of non-face-to-face time during this encounter.   Bakary Bramer S, LCAS

## 2019-08-19 ENCOUNTER — Other Ambulatory Visit: Payer: Self-pay

## 2019-08-19 ENCOUNTER — Telehealth (INDEPENDENT_AMBULATORY_CARE_PROVIDER_SITE_OTHER): Payer: No Typology Code available for payment source | Admitting: Psychiatry

## 2019-08-19 ENCOUNTER — Encounter (HOSPITAL_COMMUNITY): Payer: Self-pay | Admitting: Psychiatry

## 2019-08-19 DIAGNOSIS — F419 Anxiety disorder, unspecified: Secondary | ICD-10-CM | POA: Diagnosis not present

## 2019-08-19 DIAGNOSIS — F331 Major depressive disorder, recurrent, moderate: Secondary | ICD-10-CM | POA: Diagnosis not present

## 2019-08-19 DIAGNOSIS — F4310 Post-traumatic stress disorder, unspecified: Secondary | ICD-10-CM | POA: Diagnosis not present

## 2019-08-19 MED ORDER — TRAZODONE HCL 50 MG PO TABS: 50.0000 mg | ORAL_TABLET | Freq: Every evening | ORAL | 2 refills | Status: AC | PRN

## 2019-08-19 MED ORDER — BUSPIRONE HCL 15 MG PO TABS: 15.0000 mg | ORAL_TABLET | Freq: Three times a day (TID) | ORAL | 2 refills | Status: AC

## 2019-08-19 MED ORDER — PRAZOSIN HCL 2 MG PO CAPS: 2.0000 mg | ORAL_CAPSULE | Freq: Every day | ORAL | 2 refills | Status: DC

## 2019-08-19 MED ORDER — BUPROPION HCL ER (XL) 300 MG PO TB24: 300.0000 mg | ORAL_TABLET | Freq: Every day | ORAL | 2 refills | Status: DC

## 2019-08-19 NOTE — Progress Notes (Signed)
Virtual Visit via Telephone Note  I connected with Mark [name] on 08/19/19 at  2:20 PM EDT by telephone and verified that I am speaking with the correct person using two identifiers.  Location: Patient: home Provider: home office   I discussed the limitations, risks, security and privacy concerns of performing an evaluation and management service by telephone and the availability of in person appointments. I also discussed with the patient that there may be a patient responsible charge related to this service. The patient expressed understanding and agreed to proceed.   History of Present Illness: Patient is evaluated by phone session.  He is taking his medication but recently he started to have increased anxiety and back on hydroxyzine 25 mg twice a day prescribed by [facility] physician.  He is a still in group therapy for another 3 weeks.  He feels his depression is better but still have some from anxiety.  His nightmares and flashbacks are not as bad and he feels the trazodone Minipress combination is working well.  He has no side effects.  He lives with his daughter.  Denies any suicidal thoughts.  His energy level is okay.  His appetite is okay.  He is monitoring his blood sugar regularly and taking insulin.  He is no longer on metformin.  He is in therapy with [name].   Past Psychiatric History:Reviewed. H/Odepressionand PTSD. H/O inpatient at [facility] in [place] 2020. No suicidal attempt. Prozac and Effexor worked for while. Took Cymbalta, gabapentin, Lamictal, lexapro and hydroxyzine in past . H/O multiplePHP.   Psychiatric Specialty Exam: Physical Exam  Review of Systems  Weight (!) 280 lb (127 kg).There is no height or weight on file to calculate BMI.  General Appearance: NA  Eye Contact:  NA  Speech:  Normal Rate  Volume:  Normal  Mood:  Anxious  Affect:  NA  Thought Process:  Goal Directed  Orientation:  Full (Time, Place, and Person)  Thought Content:  WDL  Suicidal  Thoughts:  No  Homicidal Thoughts:  No  Memory:  Immediate;   Good Recent;   Good Remote;   Good  Judgement:  Intact  Insight:  Present  Psychomotor Activity:  NA  Concentration:  Concentration: Fair and Attention Span: Fair  Recall:  Good  Fund of Knowledge:  Good  Language:  Good  Akathisia:  No  Handed:  Right  AIMS (if indicated):     Assets:  Communication Skills Desire for Improvement Housing Resilience Social Support  ADL's:  Intact  Cognition:  WNL  Sleep:   ok. Uses CPAP      Assessment and Plan: Major depressive disorder, recurrent.  PTSD.  Anxiety.  Patient is now taking hydroxyzine 25 mg twice a day prescribed by the [facility] physician.  He does started 1 week ago after feeling nervous and anxious but denies any other concerns.  Like to keep his current medication.  Continue Wellbutrin XL 300 mg daily, BuSpar 15 mg 3 times a day, Minipress 2 mg at bedtime and trazodone 50 mg at bedtime.  Discussed medication side effects and benefits.  Recommended to call us back if he is any question or any concern.  Follow-up in 3 months.  Follow Up Instructions:    I discussed the assessment and treatment plan with the patient. The patient was provided an opportunity to ask questions and all were answered. The patient agreed with the plan and demonstrated an understanding of the instructions.   The patient was advised to call  back or seek an in-person evaluation if the symptoms worsen or if the condition fails to improve as anticipated.  I provided 15 minutes of non-face-to-face time during this encounter.   Kathlee Nations, MD

## 2019-08-20 ENCOUNTER — Ambulatory Visit (INDEPENDENT_AMBULATORY_CARE_PROVIDER_SITE_OTHER): Payer: No Typology Code available for payment source | Admitting: Licensed Clinical Social Worker

## 2019-08-20 ENCOUNTER — Encounter (HOSPITAL_COMMUNITY): Payer: Self-pay | Admitting: Licensed Clinical Social Worker

## 2019-08-20 ENCOUNTER — Other Ambulatory Visit: Payer: Self-pay

## 2019-08-20 DIAGNOSIS — F419 Anxiety disorder, unspecified: Secondary | ICD-10-CM

## 2019-08-20 DIAGNOSIS — F4310 Post-traumatic stress disorder, unspecified: Secondary | ICD-10-CM

## 2019-08-20 DIAGNOSIS — F331 Major depressive disorder, recurrent, moderate: Secondary | ICD-10-CM | POA: Diagnosis not present

## 2019-08-20 NOTE — Progress Notes (Signed)
Virtual Visit via Video Note  I connected with Mark [name] on 08/13/19 at 2:00 pm EDT by video enabled telemedicine with the correct person using two identifiers.   I discussed the limitations of evaluation and management by telemedicine and the availability of in person appointments. The patient expressed understanding and agreed to proceed.  LOCATION: Patient: home Provider: home    History of Present Illness: Pt was referred to [facility] OP therapy for PTSD, depression and anxiety by the [facility].     Observations/Objective: Verbalize positive and hopeful feelings of the future.   Patient presented anxious for his individual virtual therapy session. Patient discussed his psychiatric symptoms and current life events. 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 Patient reports his anxiety has increased due to his stressors: family, $, disappointments. He met with Dr Adele Schilder yesterday who did not change any medication. Patient discussed his goals financially after getting the job. Cln assisted patient in setting up strategies to meet his goals. Role played possible scenarios: working full time job while having ptsd, depression and anxiety; family issues. Patient was receptive to the scenarios.     Assessment and Plan: Counselor will continue to meet with patient to address treatment plan goals. Patient will continue to follow recommendations of providers and implement skills learned in session.  Follow Up Instructions: I discussed the assessment and treatment plan with the patient. The patient was provided an opportunity to ask questions and all were answered. The patient agreed with the plan and demonstrated an understanding of the instructions.   The patient was advised to call back or seek an in-person evaluation if the symptoms worsen or if the condition fails to improve as anticipated.  I provided 60 minutes of non-face-to-face time during this  encounter.   Mark [name] S, LCAS  .

## 2019-08-24 ENCOUNTER — Ambulatory Visit (HOSPITAL_COMMUNITY): Payer: Medicare HMO | Admitting: Licensed Clinical Social Worker

## 2019-08-31 ENCOUNTER — Ambulatory Visit (HOSPITAL_COMMUNITY): Payer: Medicare HMO | Admitting: Licensed Clinical Social Worker

## 2019-08-31 ENCOUNTER — Other Ambulatory Visit: Payer: Self-pay

## 2019-09-03 ENCOUNTER — Ambulatory Visit (INDEPENDENT_AMBULATORY_CARE_PROVIDER_SITE_OTHER): Payer: No Typology Code available for payment source | Admitting: Licensed Clinical Social Worker

## 2019-09-03 ENCOUNTER — Encounter (HOSPITAL_COMMUNITY): Payer: Self-pay | Admitting: Licensed Clinical Social Worker

## 2019-09-03 ENCOUNTER — Other Ambulatory Visit: Payer: Self-pay

## 2019-09-03 DIAGNOSIS — F331 Major depressive disorder, recurrent, moderate: Secondary | ICD-10-CM | POA: Diagnosis not present

## 2019-09-03 DIAGNOSIS — F4310 Post-traumatic stress disorder, unspecified: Secondary | ICD-10-CM

## 2019-09-03 DIAGNOSIS — F419 Anxiety disorder, unspecified: Secondary | ICD-10-CM | POA: Diagnosis not present

## 2019-09-03 NOTE — Progress Notes (Signed)
Virtual Visit via Video Note  I connected with Mark [name] on 09/03/19 at 2:00 pm EDT by video enabled telemedicine with the correct person using two identifiers.   I discussed the limitations of evaluation and management by telemedicine and the availability of in person appointments. The patient expressed understanding and agreed to proceed.  LOCATION: Patient: home Provider: home    History of Present Illness: Pt was referred to [facility] OP therapy for PTSD, depression and anxiety by the [facility].     Observations/Objective: Verbalize positive and hopeful feelings of the future.  Patient presented depressed for his individual virtual therapy session. Reviewed tx plan with pt who verbalized acceptance of the plan. Patient discussed his psychiatric symptoms and current life events. Patient is at [place] in [place] for a short visit. Patient described his visit. "I hurt my back so i've been laying down since i've been here. I'm driving home today." Used clarifying questions. Patient has spent time with family trying to decide if he should move home or not. "I spent time with my kids and my wife. My depressive symptoms have increased since I've been here." Used socratic questions.    Assessment and Plan: Counselor will continue to meet with patient to address treatment plan goals. Patient will continue to follow recommendations of providers and implement skills learned in session.  Follow Up Instructions: I discussed the assessment and treatment plan with the patient. The patient was provided an opportunity to ask questions and all were answered. The patient agreed with the plan and demonstrated an understanding of the instructions.   The patient was advised to call back or seek an in-person evaluation if the symptoms worsen or if the condition fails to improve as anticipated.  I provided 60 minutes of non-face-to-face time during this encounter.   Mark [name], LCAS  .

## 2019-09-07 ENCOUNTER — Ambulatory Visit (INDEPENDENT_AMBULATORY_CARE_PROVIDER_SITE_OTHER): Payer: No Typology Code available for payment source | Admitting: Licensed Clinical Social Worker

## 2019-09-07 ENCOUNTER — Other Ambulatory Visit: Payer: Self-pay

## 2019-09-07 ENCOUNTER — Encounter (HOSPITAL_COMMUNITY): Payer: Self-pay | Admitting: Licensed Clinical Social Worker

## 2019-09-07 DIAGNOSIS — F331 Major depressive disorder, recurrent, moderate: Secondary | ICD-10-CM

## 2019-09-07 DIAGNOSIS — F4310 Post-traumatic stress disorder, unspecified: Secondary | ICD-10-CM

## 2019-09-07 DIAGNOSIS — F419 Anxiety disorder, unspecified: Secondary | ICD-10-CM | POA: Diagnosis not present

## 2019-09-07 NOTE — Progress Notes (Signed)
Virtual Visit via Video Note  I connected with Mark [name] on 09/07/19 at 5:00 pm EDT by a video enabled telemedicine application and verified that I am speaking with the correct person using two identifiers.   I discussed the limitations of evaluation and management by telemedicine and the availability of in person appointments. The patient expressed understanding and agreed to proceed.  LOCATION: Patient: home Provider: home  History of Present Illness: [facility], [facility].    Observation/Objective:  Patient participated in a discussion on cognitive dissonance. Patient discussed his/her conflicting attitudes, beliefs and behaviors. Suggested to patient the benefit in changing his attitudes, beliefs and behaviors to reduce the discomfort and restore balance. 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 He will be in the [place] hospital until Wednesday.      Assessment and Plan: Counselor will continue to meet with patient to addre ss treatment plan goals. Patient will continue to follow recommendations of providers and implement skills learned in session.   Follow Up Instructions: I discussed the assessment and treatment plan with the patient. The patient was provided an opportunity to ask questions and all were answered. The patient agreed with the plan and demonstrated an understanding of the instructions.   The patient was advised to call back or seek an in-person evaluation if the symptoms worsen or if the condition fails to improve as anticipated.  I provided 120 minutes of non-face-to-face time during this encounter.   Mark [name] S, LCAS

## 2019-09-10 ENCOUNTER — Encounter (HOSPITAL_COMMUNITY): Payer: Self-pay | Admitting: Licensed Clinical Social Worker

## 2019-09-10 ENCOUNTER — Ambulatory Visit (INDEPENDENT_AMBULATORY_CARE_PROVIDER_SITE_OTHER): Payer: No Typology Code available for payment source | Admitting: Licensed Clinical Social Worker

## 2019-09-10 ENCOUNTER — Other Ambulatory Visit: Payer: Self-pay

## 2019-09-10 DIAGNOSIS — F4310 Post-traumatic stress disorder, unspecified: Secondary | ICD-10-CM | POA: Diagnosis not present

## 2019-09-10 DIAGNOSIS — F331 Major depressive disorder, recurrent, moderate: Secondary | ICD-10-CM | POA: Diagnosis not present

## 2019-09-10 DIAGNOSIS — F419 Anxiety disorder, unspecified: Secondary | ICD-10-CM

## 2019-09-10 NOTE — Progress Notes (Signed)
Virtual Visit via Video Note  I connected with Mark [name] on 09/03/19 at 2:00 pm EDT by video enabled telemedicine with the correct person using two identifiers.   I discussed the limitations of evaluation and management by telemedicine and the availability of in person appointments. The patient expressed understanding and agreed to proceed.  LOCATION: Patient: home Provider: home    History of Present Illness: Pt was referred to [facility] OP therapy for PTSD, depression and anxiety by the [facility].     Observations/Objective: Verbalize positive and hopeful feelings of the future.  Patient presented depressed for his individual virtual therapy session. Reviewed tx plan with pt who verbalized acceptance of the plan. Patient discussed his psychiatric symptoms and current life events. Patient [place] at [place] in [place] for a short visit. Patient described his visit. "I hurt my back so i've been laying down since i've been here. I'm driving home today." Used clarifying questions. Patient has spent time with family trying to decide if he should move home or not. "I spent time with my kids and my wife. My depressive symptoms have increased since I've been here." Used socratic questions.    Assessment and Plan: Counselor will continue to meet with patient to address treatment plan goals. Patient will continue to follow recommendations of providers and implement skills learned in session.  Follow Up Instructions: I discussed the assessment and treatment plan with the patient. The patient was provided an opportunity to ask questions and all were answered. The patient agreed with the plan and demonstrated an understanding of the instructions.   The patient was advised to call back or seek an in-person evaluation if the symptoms worsen or if the condition fails to improve as anticipated.  I provided 25 minutes of non-face-to-face time during this encounter.   Millianna Szymborski S, LCAS  .

## 2019-09-14 ENCOUNTER — Other Ambulatory Visit: Payer: Self-pay

## 2019-09-14 ENCOUNTER — Encounter (HOSPITAL_COMMUNITY): Payer: Self-pay | Admitting: Licensed Clinical Social Worker

## 2019-09-14 ENCOUNTER — Ambulatory Visit (INDEPENDENT_AMBULATORY_CARE_PROVIDER_SITE_OTHER): Payer: No Typology Code available for payment source | Admitting: Licensed Clinical Social Worker

## 2019-09-14 DIAGNOSIS — F419 Anxiety disorder, unspecified: Secondary | ICD-10-CM

## 2019-09-14 DIAGNOSIS — F331 Major depressive disorder, recurrent, moderate: Secondary | ICD-10-CM | POA: Diagnosis not present

## 2019-09-14 DIAGNOSIS — F4310 Post-traumatic stress disorder, unspecified: Secondary | ICD-10-CM

## 2019-09-14 NOTE — Progress Notes (Signed)
Virtual Visit via Video Note  I connected with Mark [name] on 09/14/19 at 6:00 pm EDT by a video enabled telemedicine application and verified that I am speaking with the correct person using two identifiers.   I discussed the limitations of evaluation and management by telemedicine and the availability of in person appointments. The patient expressed understanding and agreed to proceed.  LOCATION: Patient: home Provider: home  History of Present Illness: [facility], [facility], [facility].    Observation/Objective:  Patient is still in the [place] hospital in [place]. Patient participated in a discussion on [place], applying for disability and Social Security disability. A new veteran joined the group tonight. She introduced herself, describing her military experience, disabilities, frustration with the [facility], physical disability affecting her mental health disability. Other group members introduced themselves and participated in the discussion offering experience and suggestions.    Assessment and Plan: Counselor will continue to meet with patient to addre ss treatment plan goals. Patient will continue to follow recommendations of providers and implement skills learned in session.   Follow Up Instructions: I discussed the assessment and treatment plan with the patient. The patient was provided an opportunity to ask questions and all were answered. The patient agreed with the plan and demonstrated an understanding of the instructions.   The patient was advised to call back or seek an in-person evaluation if the symptoms worsen or if the condition fails to improve as anticipated.  I provided 60 minutes of non-face-to-face time during this encounter.   Blu Mcglaun S, LCAS

## 2019-09-17 ENCOUNTER — Other Ambulatory Visit: Payer: Self-pay

## 2019-09-17 ENCOUNTER — Ambulatory Visit (INDEPENDENT_AMBULATORY_CARE_PROVIDER_SITE_OTHER): Payer: No Typology Code available for payment source | Admitting: Licensed Clinical Social Worker

## 2019-09-17 ENCOUNTER — Encounter (HOSPITAL_COMMUNITY): Payer: Self-pay | Admitting: Licensed Clinical Social Worker

## 2019-09-17 DIAGNOSIS — F331 Major depressive disorder, recurrent, moderate: Secondary | ICD-10-CM

## 2019-09-17 DIAGNOSIS — F4310 Post-traumatic stress disorder, unspecified: Secondary | ICD-10-CM | POA: Diagnosis not present

## 2019-09-17 DIAGNOSIS — F419 Anxiety disorder, unspecified: Secondary | ICD-10-CM

## 2019-09-17 NOTE — Progress Notes (Signed)
Virtual Visit via Video Note  I connected with Mark [name] on 09/03/19 at 2:00 pm EDT by video enabled telemedicine with the correct person using two identifiers.   I discussed the limitations of evaluation and management by telemedicine and the availability of in person appointments. The patient expressed understanding and agreed to proceed.  LOCATION: Patient: [facility] Provider: home    History of Present Illness: Pt was referred to [facility] OP therapy for PTSD, depression and anxiety by the [facility].     Observations/Objective: Verbalize positive and hopeful feelings of the future.  Patient presented depressed for his individual virtual therapy session.  Patient discussed his psychiatric symptoms and current life events. Patient is in the [place] hospital in [place] due to an infection. He cannot be discharged home due to the need for nursing care, so he will be transferred to temporary assisted living at the [place] in [place]. Initially, patient was refusing to go. Clinician utilized [place] OARS to reflect and summarize thoughts and feelings about transferring to assisted living. Clinician discussed the importance of focusing on the here and now, what he can control, get the care he needs and be discharged home.  Assessment and Plan: Counselor will continue to meet with patient to address treatment plan goals. Patient will continue to follow recommendations of providers and implement skills learned in session.  Follow Up Instructions: I discussed the assessment and treatment plan with the patient. The patient was provided an opportunity to ask questions and all were answered. The patient agreed with the plan and demonstrated an understanding of the instructions.   The patient was advised to call back or seek an in-person evaluation if the symptoms worsen or if the condition fails to improve as anticipated.  I provided 45 minutes of non-face-to-face time during this  encounter.   Triston Skare S, LCAS  .

## 2019-09-21 ENCOUNTER — Other Ambulatory Visit: Payer: Self-pay

## 2019-09-21 ENCOUNTER — Ambulatory Visit (INDEPENDENT_AMBULATORY_CARE_PROVIDER_SITE_OTHER): Payer: No Typology Code available for payment source | Admitting: Licensed Clinical Social Worker

## 2019-09-21 DIAGNOSIS — F331 Major depressive disorder, recurrent, moderate: Secondary | ICD-10-CM | POA: Diagnosis not present

## 2019-09-21 DIAGNOSIS — F4310 Post-traumatic stress disorder, unspecified: Secondary | ICD-10-CM

## 2019-09-21 DIAGNOSIS — F419 Anxiety disorder, unspecified: Secondary | ICD-10-CM | POA: Diagnosis not present

## 2019-09-22 ENCOUNTER — Encounter (HOSPITAL_COMMUNITY): Payer: Self-pay | Admitting: Licensed Clinical Social Worker

## 2019-09-22 NOTE — Progress Notes (Signed)
Virtual Visit via Video Note  I connected with Mark [name] on 09/22/19 at 6:00 pm EDT by a video enabled telemedicine application and verified that I am speaking with the correct person using two identifiers.   I discussed the limitations of evaluation and management by telemedicine and the availability of in person appointments. The patient expressed understanding and agreed to proceed.  LOCATION: Patient: [name] Provider: home  History of Present Illness: [facility], depression, anxiety.    Observation/Objective:  Patient is still in the [place] hospital in [place]. Patient participated in a discussion on [place], applying for disability and Social Security disability. A second new veteran joined the group tonight. He introduced himself, describing his military experience,  physical disability, and desire to help the other veterans in the group. Other group members introduced themselves and participated in the discussion offering experience and suggestions.    Assessment and Plan: Counselor will continue to meet with patient to addre ss treatment plan goals. Patient will continue to follow recommendations of providers and implement skills learned in session.   Follow Up Instructions: I discussed the assessment and treatment plan with the patient. The patient was provided an opportunity to ask questions and all were answered. The patient agreed with the plan and demonstrated an understanding of the instructions.   The patient was advised to call back or seek an in-person evaluation if the symptoms worsen or if the condition fails to improve as anticipated.  I provided 60 minutes of non-face-to-face time during this encounter.   Youa Deloney S, LCAS

## 2019-09-30 NOTE — Telephone Encounter (Signed)
Opened in error

## 2019-10-05 ENCOUNTER — Ambulatory Visit (HOSPITAL_COMMUNITY): Payer: No Typology Code available for payment source | Admitting: Licensed Clinical Social Worker

## 2019-10-08 ENCOUNTER — Ambulatory Visit (INDEPENDENT_AMBULATORY_CARE_PROVIDER_SITE_OTHER): Payer: No Typology Code available for payment source | Admitting: Licensed Clinical Social Worker

## 2019-10-08 ENCOUNTER — Other Ambulatory Visit: Payer: Self-pay

## 2019-10-08 DIAGNOSIS — F419 Anxiety disorder, unspecified: Secondary | ICD-10-CM

## 2019-10-08 DIAGNOSIS — F331 Major depressive disorder, recurrent, moderate: Secondary | ICD-10-CM

## 2019-10-08 DIAGNOSIS — F4310 Post-traumatic stress disorder, unspecified: Secondary | ICD-10-CM | POA: Diagnosis not present

## 2019-10-12 ENCOUNTER — Encounter (HOSPITAL_COMMUNITY): Payer: Self-pay | Admitting: Licensed Clinical Social Worker

## 2019-10-12 ENCOUNTER — Ambulatory Visit (INDEPENDENT_AMBULATORY_CARE_PROVIDER_SITE_OTHER): Payer: No Typology Code available for payment source | Admitting: Licensed Clinical Social Worker

## 2019-10-12 ENCOUNTER — Other Ambulatory Visit: Payer: Self-pay

## 2019-10-12 DIAGNOSIS — F331 Major depressive disorder, recurrent, moderate: Secondary | ICD-10-CM | POA: Diagnosis not present

## 2019-10-12 DIAGNOSIS — F419 Anxiety disorder, unspecified: Secondary | ICD-10-CM | POA: Diagnosis not present

## 2019-10-12 DIAGNOSIS — F4310 Post-traumatic stress disorder, unspecified: Secondary | ICD-10-CM | POA: Diagnosis not present

## 2019-10-12 NOTE — Progress Notes (Signed)
Virtual Visit via Video Note  I connected with Mark [name] on 10/12/19 at 5:00 pm EDT by a video enabled telemedicine application and verified that I am speaking with the correct person using two identifiers.   I discussed the limitations of evaluation and management by telemedicine and the availability of in person appointments. The patient expressed understanding and agreed to proceed.  LOCATION: Patient: Home Provider: home  History of Present Illness: [facility], depression, anxiety.    Observation/Objective:   Patient participated in a discussion on emotional regulation skills, helping patient learn to manage his feelings and to better cope with situations.  Patient was encouraged to apply emotional regulation skills and focus on the positive.   Assessment and Plan: Counselor will continue to meet with patient to addre ss treatment plan goals. Patient will continue to follow recommendations of providers and implement skills learned in session.   Follow Up Instructions: I discussed the assessment and treatment plan with the patient. The patient was provided an opportunity to ask questions and all were answered. The patient agreed with the plan and demonstrated an understanding of the instructions.   The patient was advised to call back or seek an in-person evaluation if the symptoms worsen or if the condition fails to improve as anticipated.  I provided 120 minutes of non-face-to-face time during this encounter.   Mark [name], LCAS

## 2019-10-15 ENCOUNTER — Ambulatory Visit (INDEPENDENT_AMBULATORY_CARE_PROVIDER_SITE_OTHER): Payer: No Typology Code available for payment source | Admitting: Licensed Clinical Social Worker

## 2019-10-15 ENCOUNTER — Other Ambulatory Visit: Payer: Self-pay

## 2019-10-15 ENCOUNTER — Encounter (HOSPITAL_COMMUNITY): Payer: Self-pay | Admitting: Licensed Clinical Social Worker

## 2019-10-15 DIAGNOSIS — F331 Major depressive disorder, recurrent, moderate: Secondary | ICD-10-CM | POA: Diagnosis not present

## 2019-10-15 DIAGNOSIS — F4310 Post-traumatic stress disorder, unspecified: Secondary | ICD-10-CM | POA: Diagnosis not present

## 2019-10-15 DIAGNOSIS — F419 Anxiety disorder, unspecified: Secondary | ICD-10-CM

## 2019-10-15 NOTE — Progress Notes (Signed)
Virtual Visit via Video Note  I connected with Mark [name] on 10/15/19 at 2:00 pm EDT by video enabled telemedicine with the correct person using two identifiers.   I discussed the limitations of evaluation and management by telemedicine and the availability of in person appointments. The patient expressed understanding and agreed to proceed.  LOCATION: Patient: home Provider: home    History of Present Illness: Pt was referred to [facility] OP therapy for PTSD, depression and anxiety by the [facility].     Observations/Objective: Verbalize positive and hopeful feelings of the future.  Patient presented depressed for his individual virtual therapy session.  Patient discussed his psychiatric symptoms and current life events. Patient reports, "I'm extremely tired. I don't know why. I've had blood work done and its all WNL." Cln asked patient about his CPAP machine and his sleep patterns, when he discovered he had not used his CPAP machine in 2 weeks. During session, pt retrieved the CPAP machine out of his car and put it beside his bed. Patient has begun work (from home) in [employer], trying to catch up on bills. Used socratic questions. Assisted patient identifying SMART goals on budgeting.   Assessment and Plan: Counselor will continue to meet with patient to address treatment plan goals. Patient will continue to follow recommendations of providers and implement skills learned in session.  Follow Up Instructions: I discussed the assessment and treatment plan with the patient. The patient was provided an opportunity to ask questions and all were answered. The patient agreed with the plan and demonstrated an understanding of the instructions.   The patient was advised to call back or seek an in-person evaluation if the symptoms worsen or if the condition fails to improve as anticipated.  I provided 60 minutes of non-face-to-face time during this encounter.   Chrisopher Pustejovsky S,  LCAS  .

## 2019-10-19 ENCOUNTER — Other Ambulatory Visit: Payer: Self-pay

## 2019-10-19 ENCOUNTER — Ambulatory Visit (INDEPENDENT_AMBULATORY_CARE_PROVIDER_SITE_OTHER): Payer: No Typology Code available for payment source | Admitting: Licensed Clinical Social Worker

## 2019-10-19 ENCOUNTER — Encounter (HOSPITAL_COMMUNITY): Payer: Self-pay | Admitting: Licensed Clinical Social Worker

## 2019-10-19 DIAGNOSIS — F419 Anxiety disorder, unspecified: Secondary | ICD-10-CM | POA: Diagnosis not present

## 2019-10-19 DIAGNOSIS — F4310 Post-traumatic stress disorder, unspecified: Secondary | ICD-10-CM

## 2019-10-19 DIAGNOSIS — F331 Major depressive disorder, recurrent, moderate: Secondary | ICD-10-CM | POA: Diagnosis not present

## 2019-10-19 NOTE — Progress Notes (Signed)
Virtual Visit via Video Note  I connected with Mark [name] on 10/19/19 at 5:00 pm EDT by a video enabled telemedicine application and verified that I am speaking with the correct person using two identifiers.   I discussed the limitations of evaluation and management by telemedicine and the availability of in person appointments. The patient expressed understanding and agreed to proceed.  LOCATION: Patient: Home Provider: home  History of Present Illness: [facility], depression, [facility].    Observation/Objective:  Patient participated in a discussion on the benefit of being vulnerable while sharing in group: Ask for what you need, be willing to expose your feelings. Say what you want. Express what you really think. Slow down and be present. Patient was encouraged to show vulnerability while sharing in group.    Assessment and Plan: Counselor will continue to meet with patient to address treatment plan goals. Patient recommendations of providers and implement skills learned in session.   Follow Up Instructions: I discussed the assessment and treatment plan with the patient. The patient was provided an opportunity to ask questions and all were answered. The patient agreed with the plan and demonstrated an understanding of the instructions.   The patient was advised to call back or seek an in-person evaluation if the symptoms worsen or if the condition fails to improve as anticipated.  I provided 120 minutes of non-face-to-face time during this encounter.   Acelyn Basham S, LCAS

## 2019-10-20 ENCOUNTER — Encounter (HOSPITAL_COMMUNITY): Payer: Self-pay | Admitting: Licensed Clinical Social Worker

## 2019-10-20 NOTE — Progress Notes (Signed)
Virtual Visit via Video Note  I connected with [name] on 10/08/19 at 2:00 pm EDT by a video enabled telemedicine application and verified that I am speaking with the correct person using two identifiers.   I discussed the limitations of evaluation and management by telemedicine and the availability of in person appointments. The patient expressed understanding and agreed to proceed.  LOCATION: Patient: home Provider: home  History of Present Illness: [facility]    Observation/Objective:  Patient presents anxious and exhausted for his individual therapy virtual appointment. Patient described his psychiatric symptoms and current life events. "I'm working and [employer] exhausted." Used Veterinary surgeon.  Cln discussed options. Cln helped patient schedule his time to include work, meals, sleep, fun. Also, suggested patient reach out to his PCP at the [place].  Assessment and Plan: Counselor will continue to meet with patient to addre ss treatment plan goals. Patient will continue to follow recommendations of providers and implement skills learned in session.   Follow Up Instructions: I discussed the assessment and treatment plan with the patient. The patient was provided an opportunity to ask questions and all were answered. The patient agreed with the plan and demonstrated an understanding of the instructions.   The patient was advised to call back or seek an in-person evaluation if the symptoms worsen or if the condition fails to improve as anticipated.  I provided 60 minutes of non-face-to-face time during this encounter.   Eleanore Junio S, LCAS

## 2019-10-22 ENCOUNTER — Other Ambulatory Visit: Payer: Self-pay

## 2019-10-22 ENCOUNTER — Ambulatory Visit (INDEPENDENT_AMBULATORY_CARE_PROVIDER_SITE_OTHER): Payer: No Typology Code available for payment source | Admitting: Licensed Clinical Social Worker

## 2019-10-22 ENCOUNTER — Encounter (HOSPITAL_COMMUNITY): Payer: Self-pay | Admitting: Licensed Clinical Social Worker

## 2019-10-22 DIAGNOSIS — F419 Anxiety disorder, unspecified: Secondary | ICD-10-CM | POA: Diagnosis not present

## 2019-10-22 DIAGNOSIS — F331 Major depressive disorder, recurrent, moderate: Secondary | ICD-10-CM | POA: Diagnosis not present

## 2019-10-22 DIAGNOSIS — F4310 Post-traumatic stress disorder, unspecified: Secondary | ICD-10-CM | POA: Diagnosis not present

## 2019-10-22 NOTE — Progress Notes (Signed)
Virtual Visit via Video Note  I connected with [name] on 10/22/19 at 2:00 pm EDT by video enabled telemedicine with the correct person using two identifiers.   I discussed the limitations of evaluation and management by telemedicine and the availability of in person appointments. The patient expressed understanding and agreed to proceed.  LOCATION: Patient: home Provider: home    History of Present Illness: Pt was referred to [facility] OP therapy for PTSD, depression and anxiety by the [facility].     Observations/Objective: Verbalize positive and hopeful feelings of the future.  Patient presented depressed for his individual virtual therapy session."I'm still tired, but become acclimated to working." Pt reports he has not started using his CPAP machine. Used Socratic questions. Reviewed his personal SMART goals and strategies to measure progress. Patient reports he still struggles with family relationships.  Cln used family-focused CBT therapeutic approach to examine the interactional dynamics of his family members and how each of them contribute to family functioning and dysfunction.    Assessment and Plan: Counselor will continue to meet with patient to address treatment plan goals. Patient will continue to follow recommendations of providers and implement skills learned in session.  Follow Up Instructions: I discussed the assessment and treatment plan with the patient. The patient was provided an opportunity to ask questions and all were answered. The patient agreed with the plan and demonstrated an understanding of the instructions.   The patient was advised to call back or seek an in-person evaluation if the symptoms worsen or if the condition fails to improve as anticipated.  I provided 60 minutes of non-face-to-face time during this encounter.   Jacobi Nile S, LCAS  .

## 2019-10-26 ENCOUNTER — Encounter (HOSPITAL_COMMUNITY): Payer: Self-pay | Admitting: Licensed Clinical Social Worker

## 2019-10-26 ENCOUNTER — Ambulatory Visit (INDEPENDENT_AMBULATORY_CARE_PROVIDER_SITE_OTHER): Payer: No Typology Code available for payment source | Admitting: Licensed Clinical Social Worker

## 2019-10-26 ENCOUNTER — Other Ambulatory Visit: Payer: Self-pay

## 2019-10-26 DIAGNOSIS — F331 Major depressive disorder, recurrent, moderate: Secondary | ICD-10-CM | POA: Diagnosis not present

## 2019-10-26 DIAGNOSIS — F4310 Post-traumatic stress disorder, unspecified: Secondary | ICD-10-CM | POA: Diagnosis not present

## 2019-10-26 DIAGNOSIS — F419 Anxiety disorder, unspecified: Secondary | ICD-10-CM

## 2019-10-26 NOTE — Progress Notes (Signed)
Virtual Visit via Video Note  I connected with Mark [name] on 10/26/19 at 5:00 pm EDT by a video enabled telemedicine application and verified that I am speaking with the correct person using two identifiers.   I discussed the limitations of evaluation and management by telemedicine and the availability of in person appointments. The patient expressed understanding and agreed to proceed.  LOCATION: Patient: Home Provider: home  History of Present Illness: [facility].    Observation/Objective:  Patient participated in a discussion on introspection, looking inward, self-examination, reflection, and inspection of thoughts. Patient described self-reflection to describe his reflective thoughts. Patient was encouraged to continue using introspection.   Assessment and Plan: Counselor will continue to meet with patient to address treatment plan goals. Patient recommendations of providers and implement skills learned in session.   Follow Up Instructions: I discussed the assessment and treatment plan with the patient. The patient was provided an opportunity to ask questions and all were answered. The patient agreed with the plan and demonstrated an understanding of the instructions.   The patient was advised to call back or seek an in-person evaluation if the symptoms worsen or if the condition fails to improve as anticipated.  I provided 120 minutes of non-face-to-face time during this encounter.   Adellyn Capek S, LCAS

## 2019-10-29 ENCOUNTER — Ambulatory Visit (INDEPENDENT_AMBULATORY_CARE_PROVIDER_SITE_OTHER): Payer: No Typology Code available for payment source | Admitting: Licensed Clinical Social Worker

## 2019-10-29 ENCOUNTER — Other Ambulatory Visit: Payer: Self-pay

## 2019-10-29 DIAGNOSIS — F331 Major depressive disorder, recurrent, moderate: Secondary | ICD-10-CM

## 2019-10-29 DIAGNOSIS — F419 Anxiety disorder, unspecified: Secondary | ICD-10-CM

## 2019-10-29 DIAGNOSIS — F4310 Post-traumatic stress disorder, unspecified: Secondary | ICD-10-CM | POA: Diagnosis not present

## 2019-11-02 ENCOUNTER — Other Ambulatory Visit: Payer: Self-pay

## 2019-11-02 ENCOUNTER — Encounter (HOSPITAL_COMMUNITY): Payer: Self-pay | Admitting: Licensed Clinical Social Worker

## 2019-11-02 ENCOUNTER — Ambulatory Visit (INDEPENDENT_AMBULATORY_CARE_PROVIDER_SITE_OTHER): Payer: No Typology Code available for payment source | Admitting: Licensed Clinical Social Worker

## 2019-11-02 DIAGNOSIS — F331 Major depressive disorder, recurrent, moderate: Secondary | ICD-10-CM | POA: Diagnosis not present

## 2019-11-02 DIAGNOSIS — F4310 Post-traumatic stress disorder, unspecified: Secondary | ICD-10-CM | POA: Diagnosis not present

## 2019-11-02 DIAGNOSIS — F419 Anxiety disorder, unspecified: Secondary | ICD-10-CM | POA: Diagnosis not present

## 2019-11-02 NOTE — Progress Notes (Signed)
Virtual Visit via Video Note  I connected with [name] on 10/22/19 at 2:00 pm EDT by video enabled telemedicine with the correct person using two identifiers.   I discussed the limitations of evaluation and management by telemedicine and the availability of in person appointments. The patient expressed understanding and agreed to proceed.  LOCATION: Patient: home Provider: home    History of Present Illness: Pt was referred to [facility] OP therapy for PTSD, depression and anxiety by the [facility].     Observations/Objective: Verbalize positive and hopeful feelings of the future.  Patient presented depressed for his individual virtual therapy session."I'm still tired, but become acclimated to working." Pt reports he has not started using his CPAP machine. Used Socratic questions. Reviewed his personal SMART goals and strategies to measure progress. Patient reports he still struggles with family relationships. Cln used family-focused CBT therapeutic approach to examine the interactional dynamics of his family members and how each of them contribute to family functioning and dysfunction.    Assessment and Plan: Counselor will continue to meet with patient to address treatment plan goals. Patient will continue to follow recommendations of providers and implement skills learned in session.  Follow Up Instructions: I discussed the assessment and treatment plan with the patient. The patient was provided an opportunity to ask questions and all were answered. The patient agreed with the plan and demonstrated an understanding of the instructions.   The patient was advised to call back or seek an in-person evaluation if the symptoms worsen or if the condition fails to improve as anticipated.  I provided 45 minutes of non-face-to-face time during this encounter.   Sher Hellinger S, LCAS  .

## 2019-11-02 NOTE — Progress Notes (Signed)
Virtual Visit via Video Note  I connected with [name] on 11/02/19 at 5:00 pm EDT by a video enabled telemedicine application and verified that I am speaking with the correct person using two identifiers.   I discussed the limitations of evaluation and management by telemedicine and the availability of in person appointments. The patient expressed understanding and agreed to proceed.  LOCATION: Patient: Home Provider: home  History of Present Illness: [facility], [facility], [facility].    Observation/Objective:   Patient participated in a discussion on emotional regulation skills, helping patient learn to manage his feelings and to better cope with situations.  Patient was encouraged to apply emotional regulation skills and focus on the positive.  Assessment and Plan: Counselor will continue to meet with patient to address treatment plan goals. Patient recommendations of providers and implement skills learned in session.   Follow Up Instructions: I discussed the assessment and treatment plan with the patient. The patient was provided an opportunity to ask questions and all were answered. The patient agreed with the plan and demonstrated an understanding of the instructions.   The patient was advised to call back or seek an in-person evaluation if the symptoms worsen or if the condition fails to improve as anticipated.  I provided 120 minutes of non-face-to-face time during this encounter.   Shekia Kuper S, LCAS

## 2019-11-05 ENCOUNTER — Encounter (HOSPITAL_COMMUNITY): Payer: Self-pay | Admitting: Licensed Clinical Social Worker

## 2019-11-05 ENCOUNTER — Other Ambulatory Visit: Payer: Self-pay

## 2019-11-05 ENCOUNTER — Ambulatory Visit (INDEPENDENT_AMBULATORY_CARE_PROVIDER_SITE_OTHER): Payer: No Typology Code available for payment source | Admitting: Licensed Clinical Social Worker

## 2019-11-05 DIAGNOSIS — F331 Major depressive disorder, recurrent, moderate: Secondary | ICD-10-CM | POA: Diagnosis not present

## 2019-11-05 DIAGNOSIS — F419 Anxiety disorder, unspecified: Secondary | ICD-10-CM

## 2019-11-05 DIAGNOSIS — F4310 Post-traumatic stress disorder, unspecified: Secondary | ICD-10-CM | POA: Diagnosis not present

## 2019-11-09 ENCOUNTER — Ambulatory Visit (INDEPENDENT_AMBULATORY_CARE_PROVIDER_SITE_OTHER): Payer: No Typology Code available for payment source | Admitting: Licensed Clinical Social Worker

## 2019-11-09 ENCOUNTER — Other Ambulatory Visit: Payer: Self-pay

## 2019-11-09 ENCOUNTER — Encounter (HOSPITAL_COMMUNITY): Payer: Self-pay | Admitting: Licensed Clinical Social Worker

## 2019-11-09 DIAGNOSIS — F419 Anxiety disorder, unspecified: Secondary | ICD-10-CM | POA: Diagnosis not present

## 2019-11-09 DIAGNOSIS — F4310 Post-traumatic stress disorder, unspecified: Secondary | ICD-10-CM | POA: Diagnosis not present

## 2019-11-09 DIAGNOSIS — F331 Major depressive disorder, recurrent, moderate: Secondary | ICD-10-CM

## 2019-11-09 NOTE — Progress Notes (Signed)
Virtual Visit via Video Note  I connected with Mark [name] on 11/09/19 at 5:00 pm EDT by a video enabled telemedicine application and verified that I am speaking with the correct person using two identifiers.   I discussed the limitations of evaluation and management by telemedicine and the availability of in person appointments. The patient expressed understanding and agreed to proceed.  LOCATION: Patient: Home Provider: home  History of Present Illness: [facility], [facility], [facility].    Observation/Objective:  Patient participated in a discussion on using mindfulness-based stress response to assist patient in lowering his stress response. Patient was encouraged to use mindfulness skills as a coping skill.  Assessment and Plan: Counselor will continue to meet with patient to address treatment plan goals. Patient recommendations of providers and implement skills learned in session.   Follow Up Instructions: I discussed the assessment and treatment plan with the patient. The patient was provided an opportunity to ask questions and all were answered. The patient agreed with the plan and demonstrated an understanding of the instructions.   The patient was advised to call back or seek an in-person evaluation if the symptoms worsen or if the condition fails to improve as anticipated.  I provided 120 minutes of non-face-to-face time during this encounter.   Wofford Stratton S, LCAS

## 2019-11-09 NOTE — Progress Notes (Signed)
Virtual Visit via Video Note  I connected with Mark [name] on 11/05/19 at 1:00 pm EDT by video enabled telemedicine with the correct person using two identifiers.   I discussed the limitations of evaluation and management by telemedicine and the availability of in person appointments. The patient expressed understanding and agreed to proceed.  LOCATION: Patient: home Provider: home    History of Present Illness: Pt was referred to [facility] OP therapy for PTSD, depression and anxiety by the [facility].     Observations/Objective: Verbalize positive and hopeful feelings of the future.  Patient presented depressed for his individual virtual therapy session. Patient described his psychiatric symptoms and current life events. Patient is becoming more depressed because his oldest daughter is leaving to move to another state. "I'm concerned about her leaving now. I'm having some additional sores on my back and unsure if I'll have to go back into the hospital with no one to help me."   Clinician utilized MI OARS to reflect and summarize thoughts and feelings about his upcoming new normal life. Clinician discussed the importance of focusing on the here and now, what he can control, rather than what he cannot.    Assessment and Plan: Counselor will continue to meet with patient to address treatment plan goals. Patient will continue to follow recommendations of providers and implement skills learned in session.  Follow Up Instructions: I discussed the assessment and treatment plan with the patient. The patient was provided an opportunity to ask questions and all were answered. The patient agreed with the plan and demonstrated an understanding of the instructions.   The patient was advised to call back or seek an in-person evaluation if the symptoms worsen or if the condition fails to improve as anticipated.  I provided 120 minutes of non-face-to-face time during this  encounter.   Pesach Frisch S, LCAS  .

## 2019-11-12 ENCOUNTER — Encounter (HOSPITAL_COMMUNITY): Payer: Self-pay | Admitting: Licensed Clinical Social Worker

## 2019-11-12 ENCOUNTER — Other Ambulatory Visit: Payer: Self-pay

## 2019-11-12 ENCOUNTER — Ambulatory Visit (INDEPENDENT_AMBULATORY_CARE_PROVIDER_SITE_OTHER): Payer: No Typology Code available for payment source | Admitting: Licensed Clinical Social Worker

## 2019-11-12 DIAGNOSIS — F419 Anxiety disorder, unspecified: Secondary | ICD-10-CM

## 2019-11-12 DIAGNOSIS — F331 Major depressive disorder, recurrent, moderate: Secondary | ICD-10-CM

## 2019-11-12 DIAGNOSIS — F4310 Post-traumatic stress disorder, unspecified: Secondary | ICD-10-CM

## 2019-11-12 NOTE — Progress Notes (Signed)
Virtual Visit via Video Note  I connected with Mark [name] on 11/12/19 at 1:00 pm EDT by video enabled telemedicine with the correct person using two identifiers.   I discussed the limitations of evaluation and management by telemedicine and the availability of in person appointments. The patient expressed understanding and agreed to proceed.  LOCATION: Patient: home Provider: home office    History of Present Illness: Pt was referred to [facility] OP therapy for PTSD, depression and anxiety by the [facility].     Observations/Objective: Verbalize positive and hopeful feelings of the future.  Patient presented depressed for his individual virtual therapy session. Patient described his psychiatric symptoms and current life events. Patient continues in his downward spiral because his oldest daughter is leaving tomorrow to move to another state. Used Socratic questions. During session patient continued having negative thoughts. Cln provided psychoeducation on thought-stopping techniques. Also provided patient information on the cycle of depression and coping techniques. Emailed patient CBT steps for negative thoughts. Will review these at next session.       Assessment and Plan: Counselor will continue to meet with patient to address treatment plan goals. Patient will continue to follow recommendations of providers and implement skills learned in session.  Follow Up Instructions: I discussed the assessment and treatment plan with the patient. The patient was provided an opportunity to ask questions and all were answered. The patient agreed with the plan and demonstrated an understanding of the instructions.   The patient was advised to call back or seek an in-person evaluation if the symptoms worsen or if the condition fails to improve as anticipated.  I provided 60 minutes of non-face-to-face time during this encounter.   Alberto Schoch S, LCAS  .

## 2019-11-16 ENCOUNTER — Ambulatory Visit (HOSPITAL_COMMUNITY): Payer: No Typology Code available for payment source | Admitting: Licensed Clinical Social Worker

## 2019-11-17 ENCOUNTER — Encounter (HOSPITAL_COMMUNITY): Payer: No Typology Code available for payment source | Admitting: Psychiatry

## 2019-11-17 ENCOUNTER — Other Ambulatory Visit: Payer: Self-pay

## 2019-11-19 ENCOUNTER — Ambulatory Visit (HOSPITAL_COMMUNITY): Payer: No Typology Code available for payment source | Admitting: Licensed Clinical Social Worker

## 2019-11-19 NOTE — Progress Notes (Signed)
No show

## 2019-11-23 ENCOUNTER — Ambulatory Visit (INDEPENDENT_AMBULATORY_CARE_PROVIDER_SITE_OTHER): Payer: No Typology Code available for payment source | Admitting: Licensed Clinical Social Worker

## 2019-11-23 ENCOUNTER — Encounter (HOSPITAL_COMMUNITY): Payer: Self-pay | Admitting: Licensed Clinical Social Worker

## 2019-11-23 ENCOUNTER — Other Ambulatory Visit: Payer: Self-pay

## 2019-11-23 DIAGNOSIS — F419 Anxiety disorder, unspecified: Secondary | ICD-10-CM | POA: Diagnosis not present

## 2019-11-23 DIAGNOSIS — F331 Major depressive disorder, recurrent, moderate: Secondary | ICD-10-CM

## 2019-11-23 DIAGNOSIS — F4310 Post-traumatic stress disorder, unspecified: Secondary | ICD-10-CM | POA: Diagnosis not present

## 2019-11-23 NOTE — Progress Notes (Signed)
Virtual Visit via Video Note  I connected with Mark [name] on 11/23/19 at 5:00 pm EDT by a video enabled telemedicine application and verified that I am speaking with the correct person using two identifiers.   I discussed the limitations of evaluation and management by telemedicine and the availability of in person appointments. The patient expressed understanding and agreed to proceed.  LOCATION: Patient: Home Provider: home  History of Present Illness: [facility], [facility].    Observation/Objective:  Pt participated in a discussion on changing emotional responses, regulating emotions effectively and changing emotional responses. Encouraged patient to use his coping skills to more effectively regulate his emotions.   Assessment and Plan: Counselor will continue to meet with patient to address treatment plan goals. Patient recommendations of providers and implement skills learned in session.   Follow Up Instructions: I discussed the assessment and treatment plan with the patient. The patient was provided an opportunity to ask questions and all were answered. The patient agreed with the plan and demonstrated an understanding of the instructions.   The patient was advised to call back or seek an in-person evaluation if the symptoms worsen or if the condition fails to improve as anticipated.  I provided 120 minutes of non-face-to-face time during this encounter.   Mark [name], LCAS

## 2019-11-26 ENCOUNTER — Other Ambulatory Visit: Payer: Self-pay

## 2019-11-26 ENCOUNTER — Encounter (HOSPITAL_COMMUNITY): Payer: Self-pay | Admitting: Licensed Clinical Social Worker

## 2019-11-26 ENCOUNTER — Ambulatory Visit (INDEPENDENT_AMBULATORY_CARE_PROVIDER_SITE_OTHER): Payer: No Typology Code available for payment source | Admitting: Licensed Clinical Social Worker

## 2019-11-26 DIAGNOSIS — F4310 Post-traumatic stress disorder, unspecified: Secondary | ICD-10-CM | POA: Diagnosis not present

## 2019-11-26 DIAGNOSIS — F331 Major depressive disorder, recurrent, moderate: Secondary | ICD-10-CM

## 2019-11-26 DIAGNOSIS — F419 Anxiety disorder, unspecified: Secondary | ICD-10-CM | POA: Diagnosis not present

## 2019-11-26 NOTE — Progress Notes (Signed)
Virtual Visit via Video Note  I connected with Mark [name] on 11/12/19 at 2:00 pm EDT by video enabled telemedicine with the correct person using two identifiers.   I discussed the limitations of evaluation and management by telemedicine and the availability of in person appointments. The patient expressed understanding and agreed to proceed.  LOCATION: Patient: home Provider: home office    History of Present Illness: Pt was referred to [facility] OP therapy for PTSD, depression and anxiety by the [facility].     Observations/Objective: Verbalize positive and hopeful feelings of the future.  Patient presented depressed for his individual virtual therapy session. Patient described his psychiatric symptoms and current life events. Patient reports his depressive symptoms have decreased. He shared what coping skills he's using to get to this new place.Clinician utilized MI OARS to reflect and summarize thoughts and feelings. Patient reflected on acclimating to his new job, sharing his emotions. Patient also reflected on his family dysfunction and it's effects on his life. Asisted patient working through his family dysfunction, what's best for him. Validated his feelings.    Emailed patient CBT steps for negative thoughts. Will review these at next session.       Assessment and Plan: Counselor will continue to meet with patient to address treatment plan goals. Patient will continue to follow recommendations of providers and implement skills learned in session.  Follow Up Instructions: I discussed the assessment and treatment plan with the patient. The patient was provided an opportunity to ask questions and all were answered. The patient agreed with the plan and demonstrated an understanding of the instructions.   The patient was advised to call back or seek an in-person evaluation if the symptoms worsen or if the condition fails to improve as anticipated.  I provided 60 minutes of  non-face-to-face time during this encounter.   Lubna Stegeman S, LCAS  .

## 2019-11-30 ENCOUNTER — Other Ambulatory Visit: Payer: Self-pay

## 2019-11-30 ENCOUNTER — Encounter (HOSPITAL_COMMUNITY): Payer: Self-pay | Admitting: Licensed Clinical Social Worker

## 2019-11-30 ENCOUNTER — Ambulatory Visit (INDEPENDENT_AMBULATORY_CARE_PROVIDER_SITE_OTHER): Payer: No Typology Code available for payment source | Admitting: Licensed Clinical Social Worker

## 2019-11-30 DIAGNOSIS — F331 Major depressive disorder, recurrent, moderate: Secondary | ICD-10-CM

## 2019-11-30 DIAGNOSIS — F419 Anxiety disorder, unspecified: Secondary | ICD-10-CM

## 2019-11-30 DIAGNOSIS — F4310 Post-traumatic stress disorder, unspecified: Secondary | ICD-10-CM

## 2019-11-30 NOTE — Progress Notes (Signed)
Virtual Visit via Video Note  I connected with Mark [name] on 11/30/19 at 5:00 pm EST by a video enabled telemedicine application and verified that I am speaking with the correct person using two identifiers.   I discussed the limitations of evaluation and management by telemedicine and the availability of in person appointments. The patient expressed understanding and agreed to proceed.  LOCATION: Patient: Home Provider: home Office  History of Present Illness: [facility], [facility], [facility].    Observation/Objective:  Patient engaged in discussion on identified positives to assess how they have impacted mood. Clinician allowed space for group to further process emotions and clinician provided supportive statements throughout session for each patient.   Assessment and Plan: Counselor will continue to meet with patient to address treatment plan goals. Patient recommendations of providers and implement skill.   Follow Up Instructions: I discussed the assessment and treatment plan with the patient. The patient was provided an opportunity to ask questions and all were answered. The patient agreed with the plan and demonstrated an understanding of the instructions.   The patient was advised to call back or seek an in-person evaluation if the symptoms worsen or if the condition fails to improve as anticipated.  I provided 120 minutes of non-face-to-face time during this encounter.   Izyk Marty S, LCAS

## 2019-12-03 ENCOUNTER — Ambulatory Visit (INDEPENDENT_AMBULATORY_CARE_PROVIDER_SITE_OTHER): Payer: No Typology Code available for payment source | Admitting: Licensed Clinical Social Worker

## 2019-12-03 ENCOUNTER — Encounter (HOSPITAL_COMMUNITY): Payer: Self-pay | Admitting: Licensed Clinical Social Worker

## 2019-12-03 ENCOUNTER — Other Ambulatory Visit: Payer: Self-pay

## 2019-12-03 DIAGNOSIS — F419 Anxiety disorder, unspecified: Secondary | ICD-10-CM | POA: Diagnosis not present

## 2019-12-03 DIAGNOSIS — F331 Major depressive disorder, recurrent, moderate: Secondary | ICD-10-CM

## 2019-12-03 DIAGNOSIS — F4310 Post-traumatic stress disorder, unspecified: Secondary | ICD-10-CM

## 2019-12-03 NOTE — Progress Notes (Signed)
Virtual Visit via Video Note  I connected with Mark [name] on 12/03/19 at 2:00 pm EST by video enabled telemedicine with the correct person using two identifiers.   I discussed the limitations of evaluation and management by telemedicine and the availability of in person appointments. The patient expressed understanding and agreed to proceed.  LOCATION: Patient: home Provider: home office    History of Present Illness: Pt was referred to [facility] OP therapy for PTSD, depression and anxiety by the [facility].     Observations/Objective: Verbalize positive and hopeful feelings of the future.  Patient presented depressed, and in pain  for his individual virtual therapy session. Patient described his psychiatric symptoms and current life events. Patient was lying down because of his pain. "If I don't feel better in the morning then I'm going back to the [place] ED in [facility]." Asked Socratic questions. Patient described his feelings of depression because of his continued pain, his wheelchair  being broken for 1 week. Validated his feelings. Discussed next steps: ED at [facility], Call [place] about disposition of wheelchair. The patient was experiencing a great amount of pain and was unable to continue with session.       Emailed patient CBT steps for negative thoughts. Will review these at next session.       Assessment and Plan: Counselor will continue to meet with patient to address treatment plan goals. Patient will continue to follow recommendations of providers and implement skills learned in session.  Follow Up Instructions: I discussed the assessment and treatment plan with the patient. The patient was provided an opportunity to ask questions and all were answered. The patient agreed with the plan and demonstrated an understanding of the instructions.   The patient was advised to call back or seek an in-person evaluation if the symptoms worsen or if the condition fails to improve as  anticipated.  I provided 25 minutes of non-face-to-face time during this encounter.   Aryn Safran S, LCAS  .

## 2019-12-04 ENCOUNTER — Other Ambulatory Visit: Payer: Self-pay

## 2019-12-04 ENCOUNTER — Emergency Department (HOSPITAL_COMMUNITY): Payer: No Typology Code available for payment source

## 2019-12-04 ENCOUNTER — Inpatient Hospital Stay (HOSPITAL_COMMUNITY)
Admission: EM | Admit: 2019-12-04 | Discharge: 2019-12-22 | DRG: 638 | Disposition: A | Discharge status: Home / Self Care | Payer: No Typology Code available for payment source | Attending: Internal Medicine | Admitting: Internal Medicine

## 2019-12-04 DIAGNOSIS — E1165 Type 2 diabetes mellitus with hyperglycemia: Secondary | ICD-10-CM | POA: Diagnosis not present

## 2019-12-04 DIAGNOSIS — Z87891 Personal history of nicotine dependence: Secondary | ICD-10-CM

## 2019-12-04 DIAGNOSIS — E875 Hyperkalemia: Secondary | ICD-10-CM

## 2019-12-04 DIAGNOSIS — L89152 Pressure ulcer of sacral region, stage 2: Secondary | ICD-10-CM | POA: Diagnosis present

## 2019-12-04 DIAGNOSIS — L0591 Pilonidal cyst without abscess: Secondary | ICD-10-CM | POA: Diagnosis present

## 2019-12-04 DIAGNOSIS — F418 Other specified anxiety disorders: Secondary | ICD-10-CM | POA: Diagnosis not present

## 2019-12-04 DIAGNOSIS — I10 Essential (primary) hypertension: Secondary | ICD-10-CM | POA: Diagnosis not present

## 2019-12-04 DIAGNOSIS — R000 Tachycardia, unspecified: Secondary | ICD-10-CM

## 2019-12-04 DIAGNOSIS — Z872 Personal history of diseases of the skin and subcutaneous tissue: Secondary | ICD-10-CM | POA: Diagnosis not present

## 2019-12-04 DIAGNOSIS — E785 Hyperlipidemia, unspecified: Secondary | ICD-10-CM | POA: Diagnosis present

## 2019-12-04 DIAGNOSIS — E1169 Type 2 diabetes mellitus with other specified complication: Secondary | ICD-10-CM | POA: Diagnosis not present

## 2019-12-04 DIAGNOSIS — B9562 Methicillin resistant Staphylococcus aureus infection as the cause of diseases classified elsewhere: Secondary | ICD-10-CM | POA: Diagnosis present

## 2019-12-04 DIAGNOSIS — F419 Anxiety disorder, unspecified: Secondary | ICD-10-CM | POA: Diagnosis not present

## 2019-12-04 DIAGNOSIS — E669 Obesity, unspecified: Secondary | ICD-10-CM | POA: Diagnosis present

## 2019-12-04 DIAGNOSIS — Z794 Long term (current) use of insulin: Secondary | ICD-10-CM

## 2019-12-04 DIAGNOSIS — L02416 Cutaneous abscess of left lower limb: Secondary | ICD-10-CM | POA: Diagnosis present

## 2019-12-04 DIAGNOSIS — L8990 Pressure ulcer of unspecified site, unspecified stage: Secondary | ICD-10-CM | POA: Insufficient documentation

## 2019-12-04 DIAGNOSIS — F4310 Post-traumatic stress disorder, unspecified: Secondary | ICD-10-CM | POA: Diagnosis present

## 2019-12-04 DIAGNOSIS — L03116 Cellulitis of left lower limb: Secondary | ICD-10-CM | POA: Diagnosis not present

## 2019-12-04 DIAGNOSIS — L0291 Cutaneous abscess, unspecified: Secondary | ICD-10-CM

## 2019-12-04 DIAGNOSIS — L03312 Cellulitis of back [any part except buttock]: Secondary | ICD-10-CM | POA: Diagnosis not present

## 2019-12-04 DIAGNOSIS — Z888 Allergy status to other drugs, medicaments and biological substances status: Secondary | ICD-10-CM

## 2019-12-04 DIAGNOSIS — Z91138 Patient's unintentional underdosing of medication regimen for other reason: Secondary | ICD-10-CM | POA: Diagnosis not present

## 2019-12-04 DIAGNOSIS — R0602 Shortness of breath: Secondary | ICD-10-CM

## 2019-12-04 DIAGNOSIS — Z89611 Acquired absence of right leg above knee: Secondary | ICD-10-CM | POA: Diagnosis not present

## 2019-12-04 DIAGNOSIS — A4902 Methicillin resistant Staphylococcus aureus infection, unspecified site: Secondary | ICD-10-CM | POA: Diagnosis not present

## 2019-12-04 DIAGNOSIS — G8929 Other chronic pain: Secondary | ICD-10-CM | POA: Diagnosis present

## 2019-12-04 DIAGNOSIS — R9431 Abnormal electrocardiogram [ECG] [EKG]: Secondary | ICD-10-CM

## 2019-12-04 DIAGNOSIS — L0390 Cellulitis, unspecified: Secondary | ICD-10-CM

## 2019-12-04 DIAGNOSIS — N5089 Other specified disorders of the male genital organs: Secondary | ICD-10-CM | POA: Diagnosis present

## 2019-12-04 DIAGNOSIS — E1110 Type 2 diabetes mellitus with ketoacidosis without coma: Secondary | ICD-10-CM | POA: Diagnosis not present

## 2019-12-04 DIAGNOSIS — T383X6A Underdosing of insulin and oral hypoglycemic [antidiabetic] drugs, initial encounter: Secondary | ICD-10-CM | POA: Diagnosis present

## 2019-12-04 DIAGNOSIS — Z806 Family history of leukemia: Secondary | ICD-10-CM

## 2019-12-04 DIAGNOSIS — E11628 Type 2 diabetes mellitus with other skin complications: Secondary | ICD-10-CM | POA: Diagnosis not present

## 2019-12-04 DIAGNOSIS — N179 Acute kidney failure, unspecified: Secondary | ICD-10-CM | POA: Diagnosis not present

## 2019-12-04 DIAGNOSIS — R6520 Severe sepsis without septic shock: Secondary | ICD-10-CM

## 2019-12-04 DIAGNOSIS — G4700 Insomnia, unspecified: Secondary | ICD-10-CM | POA: Diagnosis not present

## 2019-12-04 DIAGNOSIS — A419 Sepsis, unspecified organism: Secondary | ICD-10-CM

## 2019-12-04 DIAGNOSIS — I152 Hypertension secondary to endocrine disorders: Secondary | ICD-10-CM | POA: Diagnosis present

## 2019-12-04 DIAGNOSIS — R234 Changes in skin texture: Secondary | ICD-10-CM

## 2019-12-04 DIAGNOSIS — R221 Localized swelling, mass and lump, neck: Secondary | ICD-10-CM | POA: Diagnosis not present

## 2019-12-04 DIAGNOSIS — Z20822 Contact with and (suspected) exposure to covid-19: Secondary | ICD-10-CM | POA: Diagnosis present

## 2019-12-04 DIAGNOSIS — Z79899 Other long term (current) drug therapy: Secondary | ICD-10-CM

## 2019-12-04 DIAGNOSIS — F331 Major depressive disorder, recurrent, moderate: Secondary | ICD-10-CM | POA: Diagnosis not present

## 2019-12-04 DIAGNOSIS — R8271 Bacteriuria: Secondary | ICD-10-CM | POA: Diagnosis present

## 2019-12-04 DIAGNOSIS — E1159 Type 2 diabetes mellitus with other circulatory complications: Secondary | ICD-10-CM | POA: Diagnosis present

## 2019-12-04 DIAGNOSIS — J90 Pleural effusion, not elsewhere classified: Secondary | ICD-10-CM | POA: Diagnosis not present

## 2019-12-04 DIAGNOSIS — L02212 Cutaneous abscess of back [any part, except buttock]: Secondary | ICD-10-CM | POA: Diagnosis not present

## 2019-12-04 DIAGNOSIS — M7989 Other specified soft tissue disorders: Secondary | ICD-10-CM | POA: Diagnosis not present

## 2019-12-04 DIAGNOSIS — E860 Dehydration: Secondary | ICD-10-CM | POA: Diagnosis present

## 2019-12-04 DIAGNOSIS — M545 Low back pain, unspecified: Secondary | ICD-10-CM | POA: Diagnosis not present

## 2019-12-04 DIAGNOSIS — Z6834 Body mass index (BMI) 34.0-34.9, adult: Secondary | ICD-10-CM | POA: Diagnosis not present

## 2019-12-04 DIAGNOSIS — E119 Type 2 diabetes mellitus without complications: Secondary | ICD-10-CM | POA: Diagnosis not present

## 2019-12-04 DIAGNOSIS — R066 Hiccough: Secondary | ICD-10-CM | POA: Diagnosis not present

## 2019-12-04 DIAGNOSIS — Z8614 Personal history of Methicillin resistant Staphylococcus aureus infection: Secondary | ICD-10-CM

## 2019-12-04 LAB — LACTIC ACID, PLASMA
Lactic Acid, Venous: 2.1 mmol/L (ref 0.5–1.9)
Lactic Acid, Venous: 2.2 mmol/L (ref 0.5–1.9)

## 2019-12-04 LAB — CBC WITH DIFFERENTIAL/PLATELET
Abs Immature Granulocytes: 0.31 10*3/uL — ABNORMAL HIGH (ref 0.00–0.07)
Basophils Absolute: 0 10*3/uL (ref 0.0–0.1)
Basophils Relative: 0 %
Eosinophils Absolute: 0 10*3/uL (ref 0.0–0.5)
Eosinophils Relative: 0 %
HCT: 38.2 % — ABNORMAL LOW (ref 39.0–52.0)
Hemoglobin: 11.7 g/dL — ABNORMAL LOW (ref 13.0–17.0)
Immature Granulocytes: 2 %
Lymphocytes Relative: 4 %
Lymphs Abs: 0.7 10*3/uL (ref 0.7–4.0)
MCH: 26.2 pg (ref 26.0–34.0)
MCHC: 30.6 g/dL (ref 30.0–36.0)
MCV: 85.5 fL (ref 80.0–100.0)
Monocytes Absolute: 0.9 10*3/uL (ref 0.1–1.0)
Monocytes Relative: 5 %
Neutro Abs: 17.3 10*3/uL — ABNORMAL HIGH (ref 1.7–7.7)
Neutrophils Relative %: 89 %
Platelets: 236 10*3/uL (ref 150–400)
RBC: 4.47 MIL/uL (ref 4.22–5.81)
RDW: 14.7 % (ref 11.5–15.5)
WBC: 19.2 10*3/uL — ABNORMAL HIGH (ref 4.0–10.5)
nRBC: 0 % (ref 0.0–0.2)

## 2019-12-04 LAB — URINALYSIS, ROUTINE W REFLEX MICROSCOPIC
Bacteria, UA: NONE SEEN
Bilirubin Urine: NEGATIVE
Glucose, UA: >=500 mg/dL — AB
Ketones, ur: 80 mg/dL — AB
Leukocytes,Ua: NEGATIVE
Nitrite: NEGATIVE
Protein, ur: 30 mg/dL — AB
Specific Gravity, Urine: 1.022 (ref 1.005–1.030)
pH: 5 (ref 5.0–8.0)

## 2019-12-04 LAB — BASIC METABOLIC PANEL
Anion gap: 25 — ABNORMAL HIGH (ref 5–15)
BUN: 39 mg/dL — ABNORMAL HIGH (ref 6–20)
CO2: 13 mmol/L — ABNORMAL LOW (ref 22–32)
Calcium: 9.5 mg/dL (ref 8.9–10.3)
Chloride: 100 mmol/L (ref 98–111)
Creatinine, Ser: 2.11 mg/dL — ABNORMAL HIGH (ref 0.61–1.24)
GFR, Estimated: 35 mL/min — ABNORMAL LOW (ref >60)
Glucose, Bld: 409 mg/dL — ABNORMAL HIGH (ref 70–99)
Potassium: 4.3 mmol/L (ref 3.5–5.1)
Sodium: 138 mmol/L (ref 135–145)

## 2019-12-04 LAB — I-STAT VENOUS BLOOD GAS, ED
Acid-base deficit: 22 mmol/L — ABNORMAL HIGH (ref 0.0–2.0)
Bicarbonate: 5.8 mmol/L — ABNORMAL LOW (ref 20.0–28.0)
Calcium, Ion: 0.52 mmol/L — CL (ref 1.15–1.40)
HCT: 19 % — ABNORMAL LOW (ref 39.0–52.0)
Hemoglobin: 6.5 g/dL — CL (ref 13.0–17.0)
O2 Saturation: 98 %
Potassium: 3.8 mmol/L (ref 3.5–5.1)
Sodium: 140 mmol/L (ref 135–145)
TCO2: 6 mmol/L — ABNORMAL LOW (ref 22–32)
pCO2, Ven: 17.9 mmHg — CL (ref 44.0–60.0)
pH, Ven: 7.118 — CL (ref 7.250–7.430)
pO2, Ven: 139 mmHg — ABNORMAL HIGH (ref 32.0–45.0)

## 2019-12-04 LAB — GLUCOSE, CAPILLARY: Glucose-Capillary: 373 mg/dL — ABNORMAL HIGH (ref 70–99)

## 2019-12-04 LAB — COMPREHENSIVE METABOLIC PANEL
ALT: 10 U/L (ref 0–44)
AST: 8 U/L — ABNORMAL LOW (ref 15–41)
Albumin: 3.4 g/dL — ABNORMAL LOW (ref 3.5–5.0)
Alkaline Phosphatase: 125 U/L (ref 38–126)
Anion gap: 29 — ABNORMAL HIGH (ref 5–15)
BUN: 38 mg/dL — ABNORMAL HIGH (ref 6–20)
CO2: 7 mmol/L — ABNORMAL LOW (ref 22–32)
Calcium: 9.3 mg/dL (ref 8.9–10.3)
Chloride: 97 mmol/L — ABNORMAL LOW (ref 98–111)
Creatinine, Ser: 2.29 mg/dL — ABNORMAL HIGH (ref 0.61–1.24)
GFR, Estimated: 32 mL/min — ABNORMAL LOW (ref >60)
Glucose, Bld: 612 mg/dL (ref 70–99)
Potassium: 5.2 mmol/L — ABNORMAL HIGH (ref 3.5–5.1)
Sodium: 133 mmol/L — ABNORMAL LOW (ref 135–145)
Total Bilirubin: 1.8 mg/dL — ABNORMAL HIGH (ref 0.3–1.2)
Total Protein: 8.5 g/dL — ABNORMAL HIGH (ref 6.5–8.1)

## 2019-12-04 LAB — HEMOGLOBIN A1C
Hgb A1c MFr Bld: 10.3 % — ABNORMAL HIGH (ref 4.8–5.6)
Mean Plasma Glucose: 248.91 mg/dL

## 2019-12-04 LAB — BETA-HYDROXYBUTYRIC ACID: Beta-Hydroxybutyric Acid: 7.27 mmol/L — ABNORMAL HIGH (ref 0.05–0.27)

## 2019-12-04 LAB — PROTIME-INR
INR: 1.1 (ref 0.8–1.2)
Prothrombin Time: 14.1 seconds (ref 11.4–15.2)

## 2019-12-04 LAB — CBG MONITORING, ED
Glucose-Capillary: 525 mg/dL (ref 70–99)
Glucose-Capillary: 452 mg/dL — ABNORMAL HIGH (ref 70–99)
Glucose-Capillary: 480 mg/dL — ABNORMAL HIGH (ref 70–99)

## 2019-12-04 LAB — RESPIRATORY PANEL BY RT PCR (FLU A&B, COVID)
Influenza A by PCR: NEGATIVE
Influenza B by PCR: NEGATIVE
SARS Coronavirus 2 by RT PCR: NEGATIVE

## 2019-12-04 LAB — OSMOLALITY: Osmolality: 353 mOsm/kg (ref 275–295)

## 2019-12-04 LAB — APTT: aPTT: 34 seconds (ref 24–36)

## 2019-12-04 MED ORDER — DEXTROSE 50 % IV SOLN: 0.0000 mL | INTRAVENOUS | Status: DC | PRN

## 2019-12-04 MED ORDER — BUSPIRONE HCL 10 MG PO TABS
15.0000 mg | ORAL_TABLET | Freq: Three times a day (TID) | ORAL | Status: DC
  Administered 2019-12-04 – 2019-12-22 (×53): 15 mg via ORAL
  Filled 2019-12-04 (×3): qty 2
  Filled 2019-12-04 (×3): qty 1
  Filled 2019-12-04 (×8): qty 2
  Filled 2019-12-04: qty 1
  Filled 2019-12-04: qty 2
  Filled 2019-12-04: qty 1
  Filled 2019-12-04 (×2): qty 2
  Filled 2019-12-04: qty 1
  Filled 2019-12-04 (×17): qty 2
  Filled 2019-12-04: qty 1
  Filled 2019-12-04: qty 2
  Filled 2019-12-04: qty 1
  Filled 2019-12-04: qty 2
  Filled 2019-12-04 (×2): qty 1
  Filled 2019-12-04 (×7): qty 2
  Filled 2019-12-04: qty 1
  Filled 2019-12-04 (×4): qty 2

## 2019-12-04 MED ORDER — LACTATED RINGERS IV SOLN: INTRAVENOUS | Status: DC

## 2019-12-04 MED ORDER — CARVEDILOL 12.5 MG PO TABS
12.5000 mg | ORAL_TABLET | Freq: Two times a day (BID) | ORAL | Status: DC
  Administered 2019-12-05 – 2019-12-22 (×35): 12.5 mg via ORAL
  Filled 2019-12-04 (×23): qty 1
  Filled 2019-12-04: qty 2
  Filled 2019-12-04 (×11): qty 1

## 2019-12-04 MED ORDER — SODIUM CHLORIDE 0.9 % IV SOLN
2.0000 g | Freq: Once | INTRAVENOUS | Status: AC
  Administered 2019-12-04: 2 g via INTRAVENOUS
  Filled 2019-12-04: qty 20

## 2019-12-04 MED ORDER — VANCOMYCIN HCL 1500 MG/300ML IV SOLN: 1500.0000 mg | INTRAVENOUS | Status: DC

## 2019-12-04 MED ORDER — ONDANSETRON HCL 4 MG/2ML IJ SOLN
4.0000 mg | Freq: Four times a day (QID) | INTRAMUSCULAR | Status: DC | PRN
  Administered 2019-12-04 – 2019-12-13 (×4): 4 mg via INTRAVENOUS
  Filled 2019-12-04 (×4): qty 2

## 2019-12-04 MED ORDER — HEPARIN SODIUM (PORCINE) 5000 UNIT/ML IJ SOLN
5000.0000 [IU] | Freq: Three times a day (TID) | INTRAMUSCULAR | Status: DC
  Administered 2019-12-04 – 2019-12-22 (×52): 5000 [IU] via SUBCUTANEOUS
  Filled 2019-12-04 (×53): qty 1

## 2019-12-04 MED ORDER — ATORVASTATIN CALCIUM 80 MG PO TABS
80.0000 mg | ORAL_TABLET | Freq: Every day | ORAL | Status: DC
  Administered 2019-12-04 – 2019-12-21 (×18): 80 mg via ORAL
  Filled 2019-12-04 (×18): qty 1

## 2019-12-04 MED ORDER — VANCOMYCIN HCL 2000 MG/400ML IV SOLN
2000.0000 mg | Freq: Once | INTRAVENOUS | Status: AC
  Administered 2019-12-04: 2000 mg via INTRAVENOUS
  Filled 2019-12-04: qty 400

## 2019-12-04 MED ORDER — INSULIN REGULAR(HUMAN) IN NACL 100-0.9 UT/100ML-% IV SOLN
INTRAVENOUS | Status: DC
  Administered 2019-12-04: 11.5 [IU]/h via INTRAVENOUS
  Filled 2019-12-04: qty 100

## 2019-12-04 MED ORDER — BUPROPION HCL ER (XL) 150 MG PO TB24
300.0000 mg | ORAL_TABLET | Freq: Every day | ORAL | Status: DC
  Administered 2019-12-04 – 2019-12-22 (×19): 300 mg via ORAL
  Filled 2019-12-04 (×3): qty 2
  Filled 2019-12-04: qty 1
  Filled 2019-12-04 (×2): qty 2
  Filled 2019-12-04 (×2): qty 1
  Filled 2019-12-04 (×2): qty 2
  Filled 2019-12-04: qty 1
  Filled 2019-12-04 (×6): qty 2
  Filled 2019-12-04: qty 1
  Filled 2019-12-04: qty 2

## 2019-12-04 MED ORDER — PRAZOSIN HCL 2 MG PO CAPS
2.0000 mg | ORAL_CAPSULE | Freq: Every day | ORAL | Status: DC
  Administered 2019-12-04 – 2019-12-21 (×18): 2 mg via ORAL
  Filled 2019-12-04 (×19): qty 1

## 2019-12-04 MED ORDER — HYDROMORPHONE HCL 1 MG/ML IJ SOLN
0.5000 mg | INTRAMUSCULAR | Status: DC | PRN
  Administered 2019-12-04 – 2019-12-05 (×4): 0.5 mg via INTRAVENOUS
  Filled 2019-12-04 (×5): qty 1

## 2019-12-04 MED ORDER — LACTATED RINGERS IV BOLUS (SEPSIS)
1000.0000 mL | Freq: Once | INTRAVENOUS | Status: AC
  Administered 2019-12-04: 1000 mL via INTRAVENOUS

## 2019-12-04 MED ORDER — MORPHINE SULFATE (PF) 4 MG/ML IV SOLN
4.0000 mg | Freq: Once | INTRAVENOUS | Status: AC
  Administered 2019-12-04: 4 mg via INTRAVENOUS
  Filled 2019-12-04: qty 1

## 2019-12-04 MED ORDER — VANCOMYCIN HCL IN DEXTROSE 1-5 GM/200ML-% IV SOLN: 1000.0000 mg | Freq: Once | INTRAVENOUS | Status: DC

## 2019-12-04 MED ORDER — ACETAMINOPHEN 325 MG PO TABS
650.0000 mg | ORAL_TABLET | Freq: Four times a day (QID) | ORAL | Status: DC | PRN
  Administered 2019-12-05: 650 mg via ORAL
  Filled 2019-12-04: qty 2

## 2019-12-04 MED ORDER — DEXTROSE IN LACTATED RINGERS 5 % IV SOLN: INTRAVENOUS | Status: DC

## 2019-12-04 MED ORDER — INSULIN REGULAR(HUMAN) IN NACL 100-0.9 UT/100ML-% IV SOLN
INTRAVENOUS | Status: DC
  Filled 2019-12-04: qty 100

## 2019-12-04 MED ORDER — DEXTROSE IN LACTATED RINGERS 5 % IV SOLN
INTRAVENOUS | Status: DC
  Administered 2019-12-04: 100 mL via INTRAVENOUS

## 2019-12-04 MED ORDER — SODIUM CHLORIDE 0.9 % IV SOLN
2.0000 g | INTRAVENOUS | Status: DC
  Administered 2019-12-05: 2 g via INTRAVENOUS
  Filled 2019-12-04: qty 2
  Filled 2019-12-04: qty 20

## 2019-12-04 NOTE — ED Triage Notes (Signed)
Pt arrived via [facility] from home for CC of tachycardia, SOB, and with painful ulcers on lower back. Pt was treated for sepsis in August. 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

## 2019-12-04 NOTE — ED Notes (Signed)
bgl [id number]; endo tool corrcet insulin to 12 u/hr

## 2019-12-04 NOTE — ED Provider Notes (Signed)
[facility] EMERGENCY DEPARTMENT Provider Note   CSN: [id number] Arrival date & time: 12/04/19  1443     History Chief Complaint  Patient presents with  . Shortness of Breath  . Tachycardia    Mark [name] is a 59 y.o. male with a past medical history of DM, hypertension, hyperlipidemia, right-sided AKA, who presents today for evaluation of multiple complaints.  He reports that for the past 2 days he has been feeling poorly.  He lives at home.  He reports that he has 2 painful wounds.  He was admitted to the [facility] [place] in August for similar and states he was in the ICU at that time.  He states that these wounds he has now are similar to the ones then.  He denies any fevers.  He reports compliance with his medications.  His pain on his wound is made worse with touch.  HPI     Past Medical History:  Diagnosis Date  . Above knee amputation status, right   . Anxiety   . Depression   . Diabetes mellitus, type II (HCC)   . Hyperlipemia   . Hypertension   . PTSD (post-traumatic stress disorder)   . Shoulder pain     Patient Active Problem List   Diagnosis Date Noted  . PTSD (post-traumatic stress disorder) 04/15/2017  . Major depressive disorder, recurrent episode, moderate (HCC) 04/30/2016  . Generalized anxiety disorder 04/30/2016    Past Surgical History:  Procedure Laterality Date  . HERNIA REPAIR    . LEG AMPUTATION Right   . parenatal cyst         Family History  Problem Relation Age of Onset  . Aneurysm Mother   . Leukemia Father   . Dementia Maternal Aunt   . Seizures Paternal Uncle     Social History   Tobacco Use  . Smoking status: Former Smoker    Quit date: 08/22/2019    Years since quitting: 0.2  . Smokeless tobacco: Never Used  Vaping Use  . Vaping Use: Never used  Substance Use Topics  . Alcohol use: Yes    Alcohol/week: 4.0 standard drinks    Types: 2 Cans of beer, 2 Shots of liquor per week    Comment: Reports light  drinking weekly.  . Drug use: Yes    Frequency: 14.0 times per week    Types: Marijuana    Comment: Uses 1-2 times a day per report for his anxiety    Home Medications Prior to Admission medications   Medication Sig Start Date End Date Taking? Authorizing Provider  atorvastatin (LIPITOR) 80 MG tablet Take 80 mg by mouth daily.   Yes   buPROPion (WELLBUTRIN XL) 300 MG 24 hr tablet Take 1 tablet (300 mg total) by mouth daily. 08/19/19  Yes Arfeen, Phillips Grout, MD  busPIRone (BUSPAR) 15 MG tablet Take 1 tablet (15 mg total) by mouth 3 (three) times daily. 08/19/19  Yes Arfeen, Phillips Grout, MD  carvedilol (COREG) 12.5 MG tablet Take 12.5 mg by mouth 2 (two) times daily with a meal.   Yes [provider]  escitalopram (LEXAPRO) 20 MG tablet Take 20 mg by mouth daily.   Yes [provider]  hydrOXYzine (VISTARIL) 25 MG capsule Take 25-50 mg by mouth 2 (two) times daily as needed for anxiety.    Yes [provider]  insulin aspart (NOVOLOG) 100 UNIT/ML injection Inject 15 Units into the skin 3 (three) times daily before meals.   Yes  [provider]  Insulin Glargine (LANTUS [place]) Inject 25-50 Units into the skin See admin instructions. Taking 50 units in the Am and 25 units at bedtime   Yes [provider]  lisinopril (ZESTRIL) 20 MG tablet Take 20 mg by mouth daily.    Yes [provider]  oxyCODONE-acetaminophen (PERCOCET) 5-325 MG tablet Take 1-2 tablets by mouth every 4 (four) hours as needed for severe pain.    Yes [provider]  pioglitazone (ACTOS) 15 MG tablet Take 15 mg by mouth daily.   Yes [provider]  prazosin (MINIPRESS) 2 MG capsule Take 1 capsule (2 mg total) by mouth at bedtime. 08/19/19  Yes Arfeen, Phillips Grout, MD  sildenafil (VIAGRA) 100 MG tablet Take 100 mg by mouth daily as needed for erectile dysfunction.   Yes [provider]  traZODone (DESYREL) 50 MG tablet Take 1 tablet (50 mg total) by mouth at bedtime as  needed. Patient taking differently: Take 50 mg by mouth at bedtime as needed for sleep.  08/19/19  Yes Arfeen, Phillips Grout, MD    Allergies    Ozempic (0.25 or 0.5 mg-dose) [semaglutide(0.25 or 0.5mg -dos)], Semaglutide, and Lyrica [pregabalin]  Review of Systems   Review of Systems  Constitutional: Positive for chills and fatigue.  HENT: Negative for congestion.   Respiratory: Positive for shortness of breath. Negative for cough.   Cardiovascular: Negative for chest pain.  Gastrointestinal: Negative for abdominal pain.  Genitourinary: Negative for dysuria.  Musculoskeletal: Negative for back pain and neck pain.  Skin: Positive for color change and wound.  Neurological: Negative for weakness and headaches.  Psychiatric/Behavioral: Negative for confusion.  All other systems reviewed and are negative.   Physical Exam Updated Vital Signs BP (!) 168/91   Pulse (!) 120   Temp 98.1 F (36.7 C) (Rectal)   Resp (!) 23   Ht 6\' 2"  (1.88 m)   Wt 122 kg   SpO2 100%   BMI 34.54 kg/m   Physical Exam Vitals and nursing note reviewed.  Constitutional:      Appearance: He is well-developed. He is ill-appearing.  HENT:     Head: Normocephalic and atraumatic.  Eyes:     Conjunctiva/sclera: Conjunctivae normal.  Cardiovascular:     Rate and Rhythm: Normal rate and regular rhythm.     Heart sounds: No murmur heard.   Pulmonary:     Effort: Pulmonary effort is normal. Tachypnea present. No respiratory distress.     Breath sounds: Normal breath sounds.  Chest:     Chest wall: No tenderness.  Abdominal:     Palpations: Abdomen is soft.     Tenderness: There is no abdominal tenderness.  Musculoskeletal:     Cervical back: Normal range of motion and neck supple.     Comments: Right sided AKI  Skin:    General: Skin is warm and dry.     Comments: Please see clinical image. There is a wound on the right sided low back and the left buttock/leg.   Neurological:     General: No focal deficit  present.     Mental Status: He is alert.  Psychiatric:        Attention and Perception: Attention normal.        Mood and Affect: Mood normal.        Behavior: Behavior normal. Behavior is cooperative.    Wound on leg       Wound on right sided lower back  ED Results / Procedures / Treatments   Labs (all labs ordered are listed, but only abnormal results are displayed) Labs Reviewed  COMPREHENSIVE METABOLIC PANEL - Abnormal; Notable for the following components:      Result Value   Sodium 133 (*)    Potassium 5.2 (*)    Chloride 97 (*)    CO2 7 (*)    Glucose, Bld 612 (*)    BUN 38 (*)    Creatinine, Ser 2.29 (*)    Total Protein 8.5 (*)    Albumin 3.4 (*)    AST 8 (*)    Total Bilirubin 1.8 (*)    GFR, Estimated 32 (*)    Anion gap 29 (*)    All other components within normal limits  LACTIC ACID, PLASMA - Abnormal; Notable for the following components:   Lactic Acid, Venous 2.1 (*)    All other components within normal limits  LACTIC ACID, PLASMA - Abnormal; Notable for the following components:   Lactic Acid, Venous 2.2 (*)    All other components within normal limits  CBC WITH DIFFERENTIAL/PLATELET - Abnormal; Notable for the following components:   WBC 19.2 (*)    Hemoglobin 11.7 (*)    HCT 38.2 (*)    Neutro Abs 17.3 (*)    Abs Immature Granulocytes 0.31 (*)    All other components within normal limits  URINALYSIS, ROUTINE W REFLEX MICROSCOPIC - Abnormal; Notable for the following components:   Color, Urine STRAW (*)    Glucose, UA >=500 (*)    Hgb urine dipstick SMALL (*)    Ketones, ur 80 (*)    Protein, ur 30 (*)    All other components within normal limits  OSMOLALITY - Abnormal; Notable for the following components:   Osmolality 353 (*)    All other components within normal limits  BETA-HYDROXYBUTYRIC ACID - Abnormal; Notable for the following components:   Beta-Hydroxybutyric Acid 7.27 (*)    All other components within normal  limits  BASIC METABOLIC PANEL - Abnormal; Notable for the following components:   CO2 13 (*)    Glucose, Bld 409 (*)    BUN 39 (*)    Creatinine, Ser 2.11 (*)    GFR, Estimated 35 (*)    Anion gap 25 (*)    All other components within normal limits  HEMOGLOBIN A1C - Abnormal; Notable for the following components:   Hgb A1c MFr Bld 10.3 (*)    All other components within normal limits  GLUCOSE, CAPILLARY - Abnormal; Notable for the following components:   Glucose-Capillary 373 (*)    All other components within normal limits  I-STAT VENOUS BLOOD GAS, ED - Abnormal; Notable for the following components:   pH, Ven 7.118 (*)    pCO2, Ven 17.9 (*)    pO2, Ven 139.0 (*)    Bicarbonate 5.8 (*)    TCO2 6 (*)    Acid-base deficit 22.0 (*)    Calcium, Ion 0.52 (*)    HCT 19.0 (*)    Hemoglobin 6.5 (*)    All other components within normal limits  CBG MONITORING, ED - Abnormal; Notable for the following components:   Glucose-Capillary 525 (*)    All other components within normal limits  CBG MONITORING, ED - Abnormal; Notable for the following components:   Glucose-Capillary 480 (*)    All other components within normal limits  CBG MONITORING, ED - Abnormal; Notable for the following components:   Glucose-Capillary 452 (*)  All other components within normal limits  RESPIRATORY PANEL BY RT PCR (FLU A&B, COVID)  CULTURE, BLOOD (ROUTINE X 2)  CULTURE, BLOOD (ROUTINE X 2)  URINE CULTURE  PROTIME-INR  APTT  HIV ANTIBODY (ROUTINE TESTING W REFLEX)  BASIC METABOLIC PANEL  BASIC METABOLIC PANEL  BASIC METABOLIC PANEL  CBC    EKG EKG Interpretation  Date/Time:  Friday December 04 2019 14:46:18 EST Ventricular Rate:  130 PR Interval:    QRS Duration: 92 QT Interval:  353 QTC Calculation: 520 R Axis:   68 Text Interpretation: Sinus tachycardia Low voltage, precordial leads Nonspecific repol abnormality, diffuse leads Prolonged QT interval No previous ECGs available Confirmed by  Richardean Canal ([id number]) on 12/04/2019 4:49:51 PM   Radiology DG Chest Port 1 View  Result Date: 12/04/2019 CLINICAL DATA:  Shortness of breath EXAM: PORTABLE CHEST 1 VIEW COMPARISON:  None. FINDINGS: No focal opacity or pleural effusion. Normal cardiomediastinal silhouette. No pneumothorax. IMPRESSION: No active disease. Electronically Signed   By: Jasmine Pang M.D.   On: 12/04/2019 17:33    Procedures .Critical Care Performed by: Cristina Gong, PA-C Authorized by: Cristina Gong, PA-C   Critical care provider statement:    Critical care time (minutes):  45   Critical care was necessary to treat or prevent imminent or life-threatening deterioration of the following conditions:  Sepsis and endocrine crisis   Critical care was time spent personally by me on the following activities:  Discussions with consultants, evaluation of patient's response to treatment, examination of patient, ordering and performing treatments and interventions, ordering and review of laboratory studies, ordering and review of radiographic studies, pulse oximetry, re-evaluation of patient's condition, obtaining history from patient or surrogate and review of old charts Ultrasound ED Soft Tissue  Date/Time: 12/04/2019 4:55 PM Performed by: Cristina Gong, PA-C Authorized by: Cristina Gong, PA-C   Procedure details:    Indications: localization of abscess     Transverse view:  Visualized   Longitudinal view:  Visualized   Images: archived   Location:    Location: lower back     Side:  Right Findings:     no abscess present    cellulitis present Ultrasound ED Soft Tissue  Date/Time: 12/04/2019 4:56 PM Performed by: Cristina Gong, PA-C Authorized by: Cristina Gong, PA-C   Procedure details:    Indications: localization of abscess     Transverse view:  Visualized   Longitudinal view:  Visualized   Images: archived   Location:    Location: lower extremity     Side:   Left Findings:     no abscess present    cellulitis present   (including critical care time)  Medications Ordered in ED Medications  cefTRIAXone (ROCEPHIN) 2 g in sodium chloride 0.9 % 100 mL IVPB (2 g Intravenous New Bag/Given 12/04/19 1700)  vancomycin (VANCOREADY) IVPB 2000 mg/400 mL (2,000 mg Intravenous New Bag/Given 12/04/19 1600)  insulin regular, human (MYXREDLIN) 100 units/ 100 mL infusion (has no administration in time range)  lactated ringers infusion (has no administration in time range)  dextrose 5 % in lactated ringers infusion (has no administration in time range)  dextrose 50 % solution 0-50 mL (has no administration in time range)  lactated ringers bolus 1,000 mL (0 mLs Intravenous Stopped 12/04/19 1657)  morphine 4 MG/ML injection 4 mg (4 mg Intravenous Given 12/04/19 1616)    ED Course  I have reviewed the triage vital signs and the nursing notes.  Pertinent labs & imaging results that were available during my care of the patient were reviewed by me and considered in my medical decision making (see chart for details).  Clinical Course as of Dec 04 2206  Fri Dec 04, 2019  1654 Insulin and fluids ordered.   Glucose(!!): 612 [EH]  1753 Will consult PCCM.   pH, Ven(!!): 7.118 [EH]  1802 I spoke with Delorise ShinerGrace with PCCM, feels that patient is suitable for hospitalist admission.     [EH]  1804 I spoke with Dr. Allena KatzPatel, hospitalist who will see patient.     [EH]    Clinical Course User Index [EH] Norman ClayHammond, Raymondo Garcialopez W, PA-C   MDM Rules/Calculators/A&P                          Patient is a 59 year old man who presents today for evaluation of feeling poorly.  He has 2 areas of cellulitis with induration.  Here he is tachycardic and tachypneic with significant leukocytosis.  Code sepsis was called on arrival.  Is started on Vanco and Rocephin.  Blood and urine cultures are ordered.   Bedside ultrasound was performed by myself, no clear drainable abscess located or  visualized.    Pain was treated in the emergency room with morphine.  He did get nauseous and vomit.  QT interval is prolonged, suspect due to electrolyte abnormalities, therefore will hold Zofran at this time.  CBC shows leukocytosis with a white count of 19.2.  Hemoglobin is slightly low at 11.7.  CMP has multiple significant abnormalities.  Patient's mildly hyponatremic with a sodium of 123.  His potassium is elevated at 5.2.  His sugar is high at 612 with a CO2 of 7, anion gap of 29.  His creatinine is elevated compared to 1 year ago at 2.29 with a GFR of 32.  Chloride is also low at 97.    His lactic acid is elevated at 2.1.  His lactic acid was not over 4 and he was not hypotensive therefore he did not require a full 30/kg under sepsis protocol.    UA has 80 ketones, over 500 glucose consistent with DKA.    He is started on IV insulin.    Patient will require admission into the hospital.    Given his VBG showing pH of 7.118 I consulted PCCM, spoke with PCCM who feels patient is appropriate for hospitalist admission to stepdown.  Spoke with hospitalist who will see patient for admission.  Note: Portions of this report may have been transcribed using voice recognition software. Every effort was made to ensure accuracy; however, inadvertent computerized transcription errors may be present   Final Clinical Impression(s) / ED Diagnoses Final diagnoses:  Sepsis with acute renal failure without septic shock, due to unspecified organism, unspecified acute renal failure type (HCC)  Diabetic ketoacidosis without coma associated with type 2 diabetes mellitus (HCC)  Cellulitis of left lower extremity  Cellulitis of lower back  Hyperkalemia  Long QT interval  Tachycardia  Shortness of breath    Rx / DC Orders ED Discharge Orders    None       Cristina GongHammond, Marylin Lathon W, PA-C 12/04/19 2210    Charlynne PanderYao, David Hsienta, MD 12/05/19 1455

## 2019-12-04 NOTE — Progress Notes (Signed)
11/12 tracking Code Sepsis bundle.

## 2019-12-04 NOTE — H&P (Signed)
History and Physical    Mark [name] LOV:[id number] DOB: Mar 23, 1960 DOA: 12/04/2019  PCP: [facility]  Patient coming from: Home via EMS  I have personally briefly reviewed patient's old medical records in [place]  Chief Complaint: Shortness of breath, skin wounds  HPI: Mark [name] is a 59 y.o. male with medical history significant for insulin-dependent type 2 diabetes, hypertension, hyperlipidemia, right AKA, depression, anxiety, PTSD who presents to the ED for evaluation of shortness of breath and skin wounds.  Patient states he was admitted at [facility] in August 2021 for infection in his back which was surgically managed.  He says 3 days ago he noticed new areas of skin infections in his posterior left thigh and right lower back.  He has been having significant tenderness at the sites.  He has been feeling poorly and says he has not been able to take his medications since this time.  He has had frequent nausea, vomiting, polyuria, polydipsia, and fatigue.  He came to the ED for further evaluation and management.  ED Course:  Initial vitals showed BP 167/82, pulse 126, RR 20, temp 97.5 Fahrenheit, SPO2 100% on room air.  Labs show serum glucose 612, sodium 133, potassium 5.2, bicarb 7, BUN 38, creatinine 2.29, anion gap 29, AST 8, ALT 10, alk phos 125, total bilirubin 1.8, WBC 19.2, hemoglobin 11.7, platelets 236,000, lactic acid 2.1, serum osmolality 353, beta hydroxy butyrate 7.27.  VBG showed pH 7.118, PCO2 70.9, PO2 139.  Urinalysis showed 80 ketones, negative nitrites, negative leukocytes, no bacteria microscopy.  Blood and urine cultures were obtained and pending.  SARS-CoV-2 PCR is negative.  Influenza A/B PCR negative.  Patient was given empiric antibiotic with IV vancomycin and ceftriaxone.  He was given 1 L LR and started on insulin infusion.  EDP discussed with on-call PCCM who felt patient was stable for admission to stepdown unit.  Bedside ultrasound of  skin lesions by EDP did not identify abscess.  The hospitalist service was consulted to admit for further evaluation and management.  Review of Systems: All systems reviewed and are negative except as documented in history of present illness above.   Past Medical History:  Diagnosis Date  . Above knee amputation status, right   . Anxiety   . Depression   . Diabetes mellitus, type II ([place])   . Hyperlipemia   . Hypertension   . PTSD (post-traumatic stress disorder)   . Shoulder pain     Past Surgical History:  Procedure Laterality Date  . HERNIA REPAIR    . LEG AMPUTATION Right   . parenatal cyst      Social History:  reports that he quit smoking about 3 months ago. He has never used smokeless tobacco. He reports current alcohol use of about 4.0 standard drinks of alcohol per week. He reports current drug use. Frequency: 14.00 times per week. Drug: Marijuana.  Allergies  Allergen Reactions  . Ozempic (0.25 Or 0.5 Mg-Dose) [Semaglutide(0.25 Or 0.84m-Dos)] Other (See Comments)    Caused Pancreatitis  . Semaglutide Other (See Comments)    Caused pancreatitis  . Lyrica [Pregabalin] Rash    Family History  Problem Relation Age of Onset  . Aneurysm Mother   . Leukemia Father   . Dementia Maternal Aunt   . Seizures Paternal Uncle      Prior to Admission medications   Medication Sig Start Date End Date Taking? Authorizing Provider  atorvastatin (LIPITOR) 80 MG tablet Take 80 mg by mouth  daily.   Yes   buPROPion (WELLBUTRIN XL) 300 MG 24 hr tablet Take 1 tablet (300 mg total) by mouth daily. 08/19/19  Yes Arfeen, Arlyce Harman, MD  busPIRone (BUSPAR) 15 MG tablet Take 1 tablet (15 mg total) by mouth 3 (three) times daily. 08/19/19  Yes Arfeen, Arlyce Harman, MD  carvedilol (COREG) 12.5 MG tablet Take 12.5 mg by mouth 2 (two) times daily with a meal.   Yes [provider]  escitalopram (LEXAPRO) 20 MG tablet Take 20 mg by mouth daily.   Yes [provider]  hydrOXYzine  (VISTARIL) 25 MG capsule Take 25-50 mg by mouth 2 (two) times daily as needed for anxiety.    Yes [provider]  insulin aspart (NOVOLOG) 100 UNIT/ML injection Inject 15 Units into the skin 3 (three) times daily before meals.   Yes [provider]  Insulin Glargine (LANTUS [place]) Inject 25-50 Units into the skin See admin instructions. Taking 50 units in the Am and 25 units at bedtime   Yes [provider]  lisinopril (ZESTRIL) 20 MG tablet Take 20 mg by mouth daily.    Yes [provider]  oxyCODONE-acetaminophen (PERCOCET) 5-325 MG tablet Take 1-2 tablets by mouth every 4 (four) hours as needed for severe pain.    Yes [provider]  pioglitazone (ACTOS) 15 MG tablet Take 15 mg by mouth daily.   Yes [provider]  prazosin (MINIPRESS) 2 MG capsule Take 1 capsule (2 mg total) by mouth at bedtime. 08/19/19  Yes Arfeen, Arlyce Harman, MD  sildenafil (VIAGRA) 100 MG tablet Take 100 mg by mouth daily as needed for erectile dysfunction.   Yes [provider]  traZODone (DESYREL) 50 MG tablet Take 1 tablet (50 mg total) by mouth at bedtime as needed. Patient taking differently: Take 50 mg by mouth at bedtime as needed for sleep.  08/19/19  Yes Kathlee Nations, MD    Physical Exam: Vitals:   12/04/19 1800 12/04/19 1815 12/04/19 1928 12/04/19 2023  BP: (!) 137/58 (!) 144/62 (!) 151/70 (!) 146/110  Pulse: (!) 119 (!) 119 (!) 126 (!) 119  Resp: 17 (!) _0 Temp:   98.1 F (36.7 C) 98.1 F (36.7 C)  TempSrc:   Oral Oral  SpO2: 100% 100% 100% 100%  Weight:      Height:       Constitutional: Resting in bed in the right lateral decubitus position, NAD, calm, appears tired and somewhat uncomfortable Eyes: PERRL, lids and conjunctivae normal ENMT: Mucous membranes are dry. Posterior pharynx clear of any exudate or lesions.Normal dentition.  Neck: normal, supple, no masses. Respiratory: clear to auscultation bilaterally, no wheezing, no  crackles. Normal respiratory effort. No accessory muscle use.  Cardiovascular: Regular rate and rhythm, no murmurs / rubs / gallops. No extremity edema. 2+ pedal pulses. Abdomen: no tenderness, no masses palpated. No hepatosplenomegaly. Bowel sounds positive.  Musculoskeletal: S/p right AKA.  No clubbing / cyanosis. No joint deformity upper and lower extremities. Good ROM, no contractures. Normal muscle tone.  Skin: Erythematous oval-shaped skin lesions on the right lower back and left posterior thigh as pictured below.  Areas are tender to light palpation.  No open wounds or obvious discharge. Neurologic: CN 2-12 grossly intact. Sensation intact, Strength intact. Psychiatric: Normal judgment and insight. Alert and oriented x 3. Normal mood.                 Labs on Admission: I have personally reviewed  following labs and imaging studies  CBC: Recent Labs  Lab 12/04/19 1540 12/04/19 1732  WBC 19.2*  --   NEUTROABS 17.3*  --   HGB 11.7* 6.5*  HCT 38.2* 19.0*  MCV 85.5  --   PLT 236  --    Basic Metabolic Panel: Recent Labs  Lab 12/04/19 1540 12/04/19 1732  NA 133* 140  K 5.2* 3.8  CL 97*  --   CO2 7*  --   GLUCOSE 612*  --   BUN 38*  --   CREATININE 2.29*  --   CALCIUM 9.3  --    GFR: Estimated Creatinine Clearance: 48.2 mL/min (A) (by C-G formula based on SCr of 2.29 mg/dL (H)). Liver Function Tests: Recent Labs  Lab 12/04/19 1540  AST 8*  ALT 10  ALKPHOS 125  BILITOT 1.8*  PROT 8.5*  ALBUMIN 3.4*   No results for input(s): LIPASE, AMYLASE in the last 168 hours. No results for input(s): AMMONIA in the last 168 hours. Coagulation Profile: Recent Labs  Lab 12/04/19 1540  INR 1.1   Cardiac Enzymes: No results for input(s): CKTOTAL, CKMB, CKMBINDEX, TROPONINI in the last 168 hours. BNP (last 3 results) No results for input(s): PROBNP in the last 8760 hours. HbA1C: No results for input(s): HGBA1C in the last 72 hours. CBG: Recent Labs  Lab  12/04/19 1746 12/04/19 1843 12/04/19 1923  GLUCAP 525* 480* 452*   Lipid Profile: No results for input(s): CHOL, HDL, LDLCALC, TRIG, CHOLHDL, LDLDIRECT in the last 72 hours. Thyroid Function Tests: No results for input(s): TSH, T4TOTAL, FREET4, T3FREE, THYROIDAB in the last 72 hours. Anemia Panel: No results for input(s): VITAMINB12, FOLATE, FERRITIN, TIBC, IRON, RETICCTPCT in the last 72 hours. Urine analysis:    Component Value Date/Time   COLORURINE STRAW (A) 12/04/2019 1601   APPEARANCEUR CLEAR 12/04/2019 1601   LABSPEC 1.022 12/04/2019 1601   PHURINE 5.0 12/04/2019 1601   GLUCOSEU >=500 (A) 12/04/2019 1601   HGBUR SMALL (A) 12/04/2019 1601   BILIRUBINUR NEGATIVE 12/04/2019 1601   KETONESUR 80 (A) 12/04/2019 1601   PROTEINUR 30 (A) 12/04/2019 1601   NITRITE NEGATIVE 12/04/2019 1601   LEUKOCYTESUR NEGATIVE 12/04/2019 1601    Radiological Exams on Admission: DG Chest Port 1 View  Result Date: 12/04/2019 CLINICAL DATA:  Shortness of breath EXAM: PORTABLE CHEST 1 VIEW COMPARISON:  None. FINDINGS: No focal opacity or pleural effusion. Normal cardiomediastinal silhouette. No pneumothorax. IMPRESSION: No active disease. Electronically Signed   By: Donavan Foil M.D.   On: 12/04/2019 17:33    EKG: Personally reviewed. Sinus tachycardia, rate 130, low voltage.  QTC 520.  No prior for comparison.  Assessment/Plan Principal Problem:   Diabetic ketoacidosis associated with type 2 diabetes mellitus ([place]) Active Problems:   AKI (acute kidney injury) ([place])   Depression with anxiety   Hypertension associated with diabetes ([place])   Hyperlipidemia associated with type 2 diabetes mellitus ([place])   Cellulitis of left lower extremity   Cellulitis of lower back  Mark [name] is a 60 y.o. male with medical history significant for insulin-dependent type 2 diabetes, hypertension, hyperlipidemia, right AKA, depression, anxiety, PTSD who is admitted with DKA.  DKA in setting of  insulin-dependent type 2 diabetes: In setting of cellulitis and not taking home insulin last few days. -Continue insulin infusion per DKA protocol -Continue IV fluids with LR and transition to D5-LR when CBG <250 -Transition to subcutaneous insulin when gap closed  Cellulitis of lower back and left lower extremity: -Continue  ceftriaxone  AKI: In setting of DKA.  Continue management as above and follow labs.  Hypertension: Continue Coreg.  Hold lisinopril with AKI.  Hyperlipidemia: Continue atorvastatin.  Depression/anxiety/PTSD: Continue Wellbutrin, BuSpar, Atarax, prazosin.  DVT prophylaxis: Subcutaneous heparin Code Status: Full code, confirmed with patient Family Communication: Discussed with patient, he has discussed with family Disposition Plan: From home and likely discharge to home pending improvement in DKA and cellulitis. Consults called: None Admission status:  Status is: Inpatient  Remains inpatient appropriate because:Persistent severe electrolyte disturbances, IV treatments appropriate due to intensity of illness or inability to take PO and Inpatient level of care appropriate due to severity of illness   Dispo: The patient is from: Home              Anticipated d/c is to: Home              Anticipated d/c date is: 2 days              Patient currently is not medically stable to d/c.  Zada Finders MD [facility]  If 7PM-7AM, please contact night-coverage [email or web address]  12/04/2019, 8:31 PM

## 2019-12-04 NOTE — Progress Notes (Signed)
Pharmacy Antibiotic Note  Mark [name] is a 59 y.o. male admitted on 12/04/2019 with cellulitis.  Pharmacy has been consulted for vancomycin dosing. Pt is afebrile but WBC and lactic acid are elevated. Scr is also elevated above baseline.   Plan: Vancomycin 2gm IV x 1 then 1500mg  IV Q24H F/u renal fxn, C&S, clinical status and trough at [facility] F/u continuation of gram neg coverage  Height: 6\' 2"  (188 cm) Weight: 122 kg (269 lb) IBW/kg (Calculated) : 82.2  Temp (24hrs), Avg:97.5 F (36.4 C), Min:97.5 F (36.4 C), Max:97.5 F (36.4 C)  Recent Labs  Lab 12/04/19 1540  WBC 19.2*  CREATININE 2.29*  LATICACIDVEN 2.1*    CrCl cannot be calculated (Patient's most recent lab result is older than the maximum 21 days allowed.).    Allergies  Allergen Reactions  . Ozempic (0.25 Or 0.5 Mg-Dose) [Semaglutide(0.25 Or 0.5mg -Dos)] Other (See Comments)    Caused Pancreatitis  . Lyrica [Pregabalin] Rash    Antimicrobials this admission: Vanc 11/12>> CTX x 1 11/12  Dose adjustments this admission: N/A  Microbiology results: Pending  Thank you for allowing pharmacy to be a part of this patient's care.  Woodard Perrell, 13/12/21 12/04/2019 3:13 PM

## 2019-12-05 ENCOUNTER — Inpatient Hospital Stay (HOSPITAL_COMMUNITY): Payer: No Typology Code available for payment source

## 2019-12-05 DIAGNOSIS — I152 Hypertension secondary to endocrine disorders: Secondary | ICD-10-CM

## 2019-12-05 DIAGNOSIS — L03312 Cellulitis of back [any part except buttock]: Secondary | ICD-10-CM

## 2019-12-05 DIAGNOSIS — E785 Hyperlipidemia, unspecified: Secondary | ICD-10-CM

## 2019-12-05 DIAGNOSIS — N179 Acute kidney failure, unspecified: Secondary | ICD-10-CM | POA: Diagnosis not present

## 2019-12-05 DIAGNOSIS — F418 Other specified anxiety disorders: Secondary | ICD-10-CM

## 2019-12-05 DIAGNOSIS — E1169 Type 2 diabetes mellitus with other specified complication: Secondary | ICD-10-CM

## 2019-12-05 DIAGNOSIS — E1110 Type 2 diabetes mellitus with ketoacidosis without coma: Principal | ICD-10-CM

## 2019-12-05 DIAGNOSIS — E1159 Type 2 diabetes mellitus with other circulatory complications: Secondary | ICD-10-CM

## 2019-12-05 LAB — BASIC METABOLIC PANEL
Anion gap: 14 (ref 5–15)
Anion gap: 14 (ref 5–15)
Anion gap: 12 (ref 5–15)
BUN: 38 mg/dL — ABNORMAL HIGH (ref 6–20)
BUN: 39 mg/dL — ABNORMAL HIGH (ref 6–20)
BUN: 37 mg/dL — ABNORMAL HIGH (ref 6–20)
CO2: 19 mmol/L — ABNORMAL LOW (ref 22–32)
CO2: 20 mmol/L — ABNORMAL LOW (ref 22–32)
CO2: 21 mmol/L — ABNORMAL LOW (ref 22–32)
Calcium: 9.3 mg/dL (ref 8.9–10.3)
Calcium: 9.3 mg/dL (ref 8.9–10.3)
Calcium: 9.1 mg/dL (ref 8.9–10.3)
Chloride: 102 mmol/L (ref 98–111)
Chloride: 103 mmol/L (ref 98–111)
Chloride: 102 mmol/L (ref 98–111)
Creatinine, Ser: 1.54 mg/dL — ABNORMAL HIGH (ref 0.61–1.24)
Creatinine, Ser: 1.57 mg/dL — ABNORMAL HIGH (ref 0.61–1.24)
Creatinine, Ser: 1.27 mg/dL — ABNORMAL HIGH (ref 0.61–1.24)
GFR, Estimated: 50 mL/min — ABNORMAL LOW (ref >60)
GFR, Estimated: 52 mL/min — ABNORMAL LOW (ref >60)
GFR, Estimated: >60 mL/min (ref >60)
Glucose, Bld: 244 mg/dL — ABNORMAL HIGH (ref 70–99)
Glucose, Bld: 244 mg/dL — ABNORMAL HIGH (ref 70–99)
Glucose, Bld: 204 mg/dL — ABNORMAL HIGH (ref 70–99)
Potassium: 3.6 mmol/L (ref 3.5–5.1)
Potassium: 3.8 mmol/L (ref 3.5–5.1)
Potassium: 3.5 mmol/L (ref 3.5–5.1)
Sodium: 135 mmol/L (ref 135–145)
Sodium: 137 mmol/L (ref 135–145)
Sodium: 135 mmol/L (ref 135–145)

## 2019-12-05 LAB — GLUCOSE, CAPILLARY
Glucose-Capillary: 234 mg/dL — ABNORMAL HIGH (ref 70–99)
Glucose-Capillary: 229 mg/dL — ABNORMAL HIGH (ref 70–99)
Glucose-Capillary: 219 mg/dL — ABNORMAL HIGH (ref 70–99)
Glucose-Capillary: 203 mg/dL — ABNORMAL HIGH (ref 70–99)

## 2019-12-05 LAB — HIV ANTIBODY (ROUTINE TESTING W REFLEX): HIV Screen 4th Generation wRfx: NONREACTIVE

## 2019-12-05 MED ORDER — INSULIN GLARGINE 100 UNIT/ML ~~LOC~~ SOLN
30.0000 [IU] | Freq: Two times a day (BID) | SUBCUTANEOUS | Status: DC
  Administered 2019-12-05: 30 [IU] via SUBCUTANEOUS
  Filled 2019-12-05 (×3): qty 0.3

## 2019-12-05 MED ORDER — HYDROXYZINE PAMOATE 25 MG PO CAPS
25.0000 mg | ORAL_CAPSULE | Freq: Two times a day (BID) | ORAL | Status: DC | PRN
  Filled 2019-12-05: qty 2

## 2019-12-05 MED ORDER — HYDROXYZINE HCL 25 MG PO TABS
25.0000 mg | ORAL_TABLET | Freq: Two times a day (BID) | ORAL | Status: DC | PRN
  Administered 2019-12-05 – 2019-12-17 (×7): 50 mg via ORAL
  Filled 2019-12-05 (×7): qty 2

## 2019-12-05 MED ORDER — INSULIN ASPART 100 UNIT/ML ~~LOC~~ SOLN
0.0000 [IU] | Freq: Every day | SUBCUTANEOUS | Status: DC
  Administered 2019-12-05: 3 [IU] via SUBCUTANEOUS
  Administered 2019-12-06: 2 [IU] via SUBCUTANEOUS
  Administered 2019-12-14: 3 [IU] via SUBCUTANEOUS
  Administered 2019-12-15 – 2019-12-16 (×2): 2 [IU] via SUBCUTANEOUS

## 2019-12-05 MED ORDER — INSULIN GLARGINE 100 UNIT/ML ~~LOC~~ SOLN
30.0000 [IU] | Freq: Every day | SUBCUTANEOUS | Status: DC
  Administered 2019-12-06 – 2019-12-15 (×10): 30 [IU] via SUBCUTANEOUS
  Filled 2019-12-05 (×10): qty 0.3

## 2019-12-05 MED ORDER — OXYCODONE-ACETAMINOPHEN 5-325 MG PO TABS
1.0000 | ORAL_TABLET | ORAL | Status: DC | PRN
  Administered 2019-12-05 – 2019-12-14 (×29): 2 via ORAL
  Administered 2019-12-14: 1 via ORAL
  Administered 2019-12-14: 2 via ORAL
  Administered 2019-12-14: 1 via ORAL
  Administered 2019-12-15 – 2019-12-21 (×24): 2 via ORAL
  Administered 2019-12-21 (×2): 1 via ORAL
  Administered 2019-12-22 (×2): 2 via ORAL
  Filled 2019-12-05 (×8): qty 2
  Filled 2019-12-05: qty 1
  Filled 2019-12-05 (×6): qty 2
  Filled 2019-12-05: qty 1
  Filled 2019-12-05 (×8): qty 2
  Filled 2019-12-05: qty 1
  Filled 2019-12-05 (×7): qty 2
  Filled 2019-12-05: qty 1
  Filled 2019-12-05 (×3): qty 2
  Filled 2019-12-05: qty 1
  Filled 2019-12-05 (×12): qty 2
  Filled 2019-12-05: qty 1
  Filled 2019-12-05 (×3): qty 2
  Filled 2019-12-05: qty 1
  Filled 2019-12-05 (×7): qty 2
  Filled 2019-12-05: qty 1
  Filled 2019-12-05: qty 2

## 2019-12-05 MED ORDER — INSULIN ASPART 100 UNIT/ML ~~LOC~~ SOLN
0.0000 [IU] | Freq: Three times a day (TID) | SUBCUTANEOUS | Status: DC
  Administered 2019-12-05: 8 [IU] via SUBCUTANEOUS
  Administered 2019-12-05: 2 [IU] via SUBCUTANEOUS
  Administered 2019-12-06: 8 [IU] via SUBCUTANEOUS
  Administered 2019-12-06: 5 [IU] via SUBCUTANEOUS
  Administered 2019-12-06: 8 [IU] via SUBCUTANEOUS
  Administered 2019-12-07: 3 [IU] via SUBCUTANEOUS
  Administered 2019-12-07: 2 [IU] via SUBCUTANEOUS
  Administered 2019-12-07: 5 [IU] via SUBCUTANEOUS
  Administered 2019-12-08 – 2019-12-09 (×5): 3 [IU] via SUBCUTANEOUS
  Administered 2019-12-09 – 2019-12-10 (×2): 2 [IU] via SUBCUTANEOUS
  Administered 2019-12-10: 5 [IU] via SUBCUTANEOUS
  Administered 2019-12-10 – 2019-12-11 (×3): 3 [IU] via SUBCUTANEOUS
  Administered 2019-12-11: 8 [IU] via SUBCUTANEOUS
  Administered 2019-12-12: 5 [IU] via SUBCUTANEOUS
  Administered 2019-12-12: 2 [IU] via SUBCUTANEOUS
  Administered 2019-12-12: 8 [IU] via SUBCUTANEOUS
  Administered 2019-12-13: 5 [IU] via SUBCUTANEOUS
  Administered 2019-12-13: 3 [IU] via SUBCUTANEOUS
  Administered 2019-12-13 – 2019-12-14 (×3): 5 [IU] via SUBCUTANEOUS
  Administered 2019-12-14: 2 [IU] via SUBCUTANEOUS
  Administered 2019-12-15: 5 [IU] via SUBCUTANEOUS
  Administered 2019-12-15 (×2): 3 [IU] via SUBCUTANEOUS
  Administered 2019-12-16: 5 [IU] via SUBCUTANEOUS
  Administered 2019-12-16 (×2): 3 [IU] via SUBCUTANEOUS
  Administered 2019-12-17 (×2): 5 [IU] via SUBCUTANEOUS
  Administered 2019-12-17: 3 [IU] via SUBCUTANEOUS
  Administered 2019-12-18 (×2): 8 [IU] via SUBCUTANEOUS
  Administered 2019-12-19: 5 [IU] via SUBCUTANEOUS
  Administered 2019-12-19: 8 [IU] via SUBCUTANEOUS
  Administered 2019-12-19: 2 [IU] via SUBCUTANEOUS
  Administered 2019-12-20: 5 [IU] via SUBCUTANEOUS
  Administered 2019-12-20: 3 [IU] via SUBCUTANEOUS
  Administered 2019-12-21: 8 [IU] via SUBCUTANEOUS
  Administered 2019-12-21 (×2): 2 [IU] via SUBCUTANEOUS

## 2019-12-05 MED ORDER — INSULIN ASPART 100 UNIT/ML ~~LOC~~ SOLN
10.0000 [IU] | Freq: Three times a day (TID) | SUBCUTANEOUS | Status: DC
  Administered 2019-12-06 – 2019-12-10 (×11): 10 [IU] via SUBCUTANEOUS

## 2019-12-05 MED ORDER — HYDROMORPHONE HCL 1 MG/ML IJ SOLN
0.5000 mg | INTRAMUSCULAR | Status: DC | PRN
  Administered 2019-12-05 – 2019-12-08 (×14): 0.5 mg via INTRAVENOUS
  Filled 2019-12-05 (×14): qty 1

## 2019-12-05 NOTE — Plan of Care (Signed)
Continue to monitor

## 2019-12-05 NOTE — Progress Notes (Signed)
BG 149. Endotool stopped at this time per MD order. Will cover via sliding scale orders.

## 2019-12-05 NOTE — Progress Notes (Signed)
PROGRESS NOTE    Mark [name]  KXF:[id number] DOB: 12/24/1960 DOA: 12/04/2019 PCP: [name]   Brief Narrative: Mark [name] is a 59 y.o. male with medical history significant for insulin-dependent type 2 diabetes, hypertension, hyperlipidemia, right AKA, depression, anxiety, PTSD. Patient presented secondary to dyspnea and skin wounds and found to have DKA. Insulin drip started. Also found to have cellulitis and Ceftriaxone started.   Assessment & Plan:   Principal Problem:   Diabetic ketoacidosis associated with type 2 diabetes mellitus ([facility]) Active Problems:   AKI (acute kidney injury) ([facility])   Depression with anxiety   Hypertension associated with diabetes ([facility])   Hyperlipidemia associated with type 2 diabetes mellitus ([facility])   Cellulitis of left lower extremity   Cellulitis of lower back   Diabetic ketoacidosis Diabetes mellitus, type 2 Patient presented in DKA with venous pH of 7.1 and bicarbonate of 5.8, anion gap of 25 with a CO2 of 13. Secondary to patient not taking prescribed insulin which was secondary to poor oral intake. Patient started on insulin IV. Acidosis resolved and anion gap closed. -Transition to Lantus 30 minutes BID -Continue SSI  Cellulitis Located on right back. Concern there could be an underlying abscess -US soft tissue -Continue Ceftriaxone -Continue analgesics prn  AKI Baseline creatinine of 1 from 2019. Creatinine of 2.29 on admission. Secondary to dehydration from poor oral intake and hyperglycemia. Given IV fluids on admission and during administration of IV insulin. Back towards baseline.  Essential hypertension Patient is on Coreg, lisinopril, prazosin  Depression/anxiety -Continue Buspar, Wellbutrin  Hyperlipidemia -Continue Lipitor  Pressure injury Sacrum, present on admission  Obesity Body mass index is 34.54 kg/m.   DVT prophylaxis: Heparin subq Code Status:   Code Status: Full Code Family Communication:  None at bedside Disposition Plan: Discharge home pending workup for cellulitis, transition to oral antibiotics, transition to subcutaneous insulin   Consultants:   None  Procedures:   None  Antimicrobials:  Ceftriaxone    Subjective: Back pain. Some nausea. No other issues.  Objective: Vitals:   12/05/19 0600 12/05/19 0804 12/05/19 1108 12/05/19 1200  BP: (!) 166/75 (!) 162/81 (!) 151/78 140/76  Pulse: (!) 110 (!) 121 (!) 109 97  Resp: 16 18 18 17   Temp: 98.4 F (36.9 C) (!) 97.5 F (36.4 C) 98.6 F (37 C)   TempSrc: Oral Oral Oral   SpO2: 97% 99% 100%   Weight:      Height:        Intake/Output Summary (Last 24 hours) at 12/05/2019 1400 Last data filed at 12/05/2019 1300 Gross per 24 hour  Intake 1940.06 ml  Output 600 ml  Net 1340.06 ml   Filed Weights   12/04/19 1453  Weight: 122 kg    Examination:  General exam: Appears calm and comfortable Respiratory system: Clear to auscultation. Respiratory effort normal. Cardiovascular system: S1 & S2 heard, RRR. No murmurs, rubs, gallops or clicks. Gastrointestinal system: Abdomen is nondistended, soft and nontender. No organomegaly or masses felt. Normal bowel sounds heard. Central nervous system: Alert and oriented. No focal neurological deficits. Musculoskeletal: No edema. No calf tenderness Skin: No cyanosis. Right lower back with mild erythema over ulcerative lesion and underlying induration with tenderness and no appreciable fluctuance Psychiatry: Judgement and insight appear normal. Mood & affect appropriate.     Data Reviewed: I have personally reviewed following labs and imaging studies  CBC Lab Results  Component Value Date   WBC 14.3 (H) 12/05/2019   RBC 4.27 12/05/2019  HGB 11.3 (L) 12/05/2019   HCT 35.0 (L) 12/05/2019   MCV 82.0 12/05/2019   MCH 26.5 12/05/2019   PLT 218 12/05/2019   MCHC 32.3 12/05/2019   RDW 14.7 12/05/2019   LYMPHSABS 0.7 12/04/2019   MONOABS 0.9 12/04/2019    EOSABS 0.0 12/04/2019   BASOSABS 0.0 12/04/2019     Last metabolic panel Lab Results  Component Value Date   NA 135 12/05/2019   K 3.5 12/05/2019   CL 102 12/05/2019   CO2 21 (L) 12/05/2019   BUN 37 (H) 12/05/2019   CREATININE 1.27 (H) 12/05/2019   GLUCOSE 204 (H) 12/05/2019   GFRNONAA >60 12/05/2019   GFRAA >60 12/26/2017   CALCIUM 9.1 12/05/2019   PROT 8.5 (H) 12/04/2019   ALBUMIN 3.4 (L) 12/04/2019   BILITOT 1.8 (H) 12/04/2019   ALKPHOS 125 12/04/2019   AST 8 (L) 12/04/2019   ALT 10 12/04/2019   ANIONGAP 12 12/05/2019    CBG (last 3)  Recent Labs    12/05/19 0306 12/05/19 0412 12/05/19 0646  GLUCAP 229* 219* 203*     GFR: Estimated Creatinine Clearance: 86.9 mL/min (A) (by C-G formula based on SCr of 1.27 mg/dL (H)).  Coagulation Profile: Recent Labs  Lab 12/04/19 1540  INR 1.1    Recent Results (from the past 240 hour(s))  Respiratory Panel by RT PCR (Flu A&B, Covid) - Nasopharyngeal Swab     Status: None   Collection Time: 12/04/19  3:24 PM   Specimen: Nasopharyngeal Swab  Result Value Ref Range Status   SARS Coronavirus 2 by RT PCR NEGATIVE NEGATIVE Final    Comment: (NOTE) SARS-CoV-2 target nucleic acids are NOT DETECTED.  The SARS-CoV-2 RNA is generally detectable in upper respiratoy specimens during the acute phase of infection. The lowest concentration of SARS-CoV-2 viral copies this assay can detect is 131 copies/mL. A negative result does not preclude SARS-Cov-2 infection and should not be used as the sole basis for treatment or other patient management decisions. A negative result may occur with  improper specimen collection/handling, submission of specimen other than nasopharyngeal swab, presence of viral mutation(s) within the areas targeted by this assay, and inadequate number of viral copies (<131 copies/mL). A negative result must be combined with clinical observations, patient history, and epidemiological information. The expected  result is Negative.  Fact Sheet for Patients:  https://www.moore.com/  Fact Sheet for Healthcare Providers:  https://www.young.biz/  This test is no t yet approved or cleared by the [place] FDA and  has been authorized for detection and/or diagnosis of SARS-CoV-2 by FDA under an Emergency Use Authorization (EUA). This EUA will remain  in effect (meaning this test can be used) for the duration of the COVID-19 declaration under Section 564(b)(1) of the Act, 21 U.S.C. section 360bbb-3(b)(1), unless the authorization is terminated or revoked sooner.     Influenza A by PCR NEGATIVE NEGATIVE Final   Influenza B by PCR NEGATIVE NEGATIVE Final    Comment: (NOTE) The Xpert Xpress SARS-CoV-2/FLU/RSV assay is intended as an aid in  the diagnosis of influenza from Nasopharyngeal swab specimens and  should not be used as a sole basis for treatment. Nasal washings and  aspirates are unacceptable for Xpert Xpress SARS-CoV-2/FLU/RSV  testing.  Fact Sheet for Patients: https://www.moore.com/  Fact Sheet for Healthcare Providers: https://www.young.biz/  This test is not yet approved or cleared by the [place] FDA and  has been authorized for detection and/or diagnosis of SARS-CoV-2 by  FDA under an Emergency  Use Authorization (EUA). This EUA will remain  in effect (meaning this test can be used) for the duration of the  Covid-19 declaration under Section 564(b)(1) of the Act, 21  U.S.C. section 360bbb-3(b)(1), unless the authorization is  terminated or revoked. Performed at [facility], [address] [place], [place] [address]   Culture, blood (Routine x 2)     Status: None (Preliminary result)   Collection Time: 12/04/19  3:40 PM   Specimen: BLOOD LEFT WRIST  Result Value Ref Range Status   Specimen Description BLOOD LEFT WRIST  Final   Special Requests   Final    BOTTLES DRAWN AEROBIC ONLY  Blood Culture results may not be optimal due to an inadequate volume of blood received in culture bottles   Culture   Final    NO GROWTH < 24 HOURS Performed at [facility], [address] [place], [place] [address]    Report Status PENDING  Incomplete  Culture, blood (Routine x 2)     Status: None (Preliminary result)   Collection Time: 12/04/19  3:44 PM   Specimen: BLOOD LEFT HAND  Result Value Ref Range Status   Specimen Description BLOOD LEFT HAND  Final   Special Requests   Final    BOTTLES DRAWN AEROBIC AND ANAEROBIC Blood Culture adequate volume   Culture   Final    NO GROWTH < 24 HOURS Performed at [facility], [address], [place], [place] [address]    Report Status PENDING  Incomplete  Urine culture     Status: Abnormal (Preliminary result)   Collection Time: 12/04/19  4:01 PM   Specimen: In/Out Cath Urine  Result Value Ref Range Status   Specimen Description IN/OUT CATH URINE  Final   Special Requests NONE  Final   Culture (A)  Final    2,000 COLONIES/mL STAPHYLOCOCCUS AUREUS SUSCEPTIBILITIES TO FOLLOW Performed at [facility], [address], [place], [place] [address]    Report Status PENDING  Incomplete        Radiology Studies: DG Chest Port 1 View  Result Date: 12/04/2019 CLINICAL DATA:  Shortness of breath EXAM: PORTABLE CHEST 1 VIEW COMPARISON:  None. FINDINGS: No focal opacity or pleural effusion. Normal cardiomediastinal silhouette. No pneumothorax. IMPRESSION: No active disease. Electronically Signed   By: Jasmine Pang M.D.   On: 12/04/2019 17:33        Scheduled Meds:  atorvastatin  80 mg Oral Q2000   buPROPion  300 mg Oral Daily   busPIRone  15 mg Oral TID   carvedilol  12.5 mg Oral BID WC   heparin  5,000 Units Subcutaneous Q8H   insulin aspart  0-15 Units Subcutaneous TID WC   insulin aspart  0-5 Units Subcutaneous QHS   insulin aspart  10 Units Subcutaneous TID WC   insulin glargine  30 Units  Subcutaneous BID   prazosin  2 mg Oral QHS   Continuous Infusions:  cefTRIAXone (ROCEPHIN)  IV     dextrose 5% lactated ringers 100 mL/hr at 12/05/19 0056   lactated ringers Stopped (12/05/19 0059)     LOS: 1 day     Jacquelin Hawking, MD [facility] 12/05/2019, 2:00 PM  If 7PM-7AM, please contact night-coverage [email or web address]

## 2019-12-06 ENCOUNTER — Inpatient Hospital Stay (HOSPITAL_COMMUNITY): Payer: No Typology Code available for payment source

## 2019-12-06 DIAGNOSIS — L03312 Cellulitis of back [any part except buttock]: Secondary | ICD-10-CM | POA: Diagnosis not present

## 2019-12-06 DIAGNOSIS — E1110 Type 2 diabetes mellitus with ketoacidosis without coma: Secondary | ICD-10-CM | POA: Diagnosis not present

## 2019-12-06 DIAGNOSIS — F418 Other specified anxiety disorders: Secondary | ICD-10-CM | POA: Diagnosis not present

## 2019-12-06 DIAGNOSIS — N179 Acute kidney failure, unspecified: Secondary | ICD-10-CM | POA: Diagnosis not present

## 2019-12-06 LAB — URINE CULTURE: Culture: 2000 — AB

## 2019-12-06 LAB — CBC
HCT: 35 % — ABNORMAL LOW (ref 39.0–52.0)
HCT: 32.6 % — ABNORMAL LOW (ref 39.0–52.0)
Hemoglobin: 11.3 g/dL — ABNORMAL LOW (ref 13.0–17.0)
Hemoglobin: 10.5 g/dL — ABNORMAL LOW (ref 13.0–17.0)
MCH: 26.5 pg (ref 26.0–34.0)
MCH: 26 pg (ref 26.0–34.0)
MCHC: 32.3 g/dL (ref 30.0–36.0)
MCHC: 32.2 g/dL (ref 30.0–36.0)
MCV: 82 fL (ref 80.0–100.0)
MCV: 80.7 fL (ref 80.0–100.0)
Platelets: 218 10*3/uL (ref 150–400)
Platelets: 187 10*3/uL (ref 150–400)
RBC: 4.27 MIL/uL (ref 4.22–5.81)
RBC: 4.04 MIL/uL — ABNORMAL LOW (ref 4.22–5.81)
RDW: 14.7 % (ref 11.5–15.5)
RDW: 14.8 % (ref 11.5–15.5)
WBC: 14.3 10*3/uL — ABNORMAL HIGH (ref 4.0–10.5)
WBC: 14 10*3/uL — ABNORMAL HIGH (ref 4.0–10.5)
nRBC: 0 % (ref 0.0–0.2)
nRBC: 0 % (ref 0.0–0.2)

## 2019-12-06 LAB — BASIC METABOLIC PANEL
Anion gap: 12 (ref 5–15)
BUN: 29 mg/dL — ABNORMAL HIGH (ref 6–20)
CO2: 19 mmol/L — ABNORMAL LOW (ref 22–32)
Calcium: 9 mg/dL (ref 8.9–10.3)
Chloride: 102 mmol/L (ref 98–111)
Creatinine, Ser: 1.14 mg/dL (ref 0.61–1.24)
GFR, Estimated: >60 mL/min (ref >60)
Glucose, Bld: 277 mg/dL — ABNORMAL HIGH (ref 70–99)
Potassium: 4 mmol/L (ref 3.5–5.1)
Sodium: 133 mmol/L — ABNORMAL LOW (ref 135–145)

## 2019-12-06 LAB — GLUCOSE, CAPILLARY: Glucose-Capillary: 273 mg/dL — ABNORMAL HIGH (ref 70–99)

## 2019-12-06 MED ORDER — LACTATED RINGERS IV BOLUS
1000.0000 mL | Freq: Once | INTRAVENOUS | Status: AC
  Administered 2019-12-06: 1000 mL via INTRAVENOUS

## 2019-12-06 MED ORDER — SODIUM CHLORIDE 0.9 % IV SOLN
2.0000 g | INTRAVENOUS | Status: DC
  Administered 2019-12-06 – 2019-12-07 (×2): 2 g via INTRAVENOUS
  Filled 2019-12-06 (×2): qty 2
  Filled 2019-12-06: qty 20

## 2019-12-06 MED ORDER — IOHEXOL 300 MG/ML  SOLN
75.0000 mL | Freq: Once | INTRAMUSCULAR | Status: AC | PRN
  Administered 2019-12-06: 75 mL via INTRAVENOUS

## 2019-12-06 MED ORDER — VANCOMYCIN HCL IN DEXTROSE 1-5 GM/200ML-% IV SOLN
1000.0000 mg | Freq: Three times a day (TID) | INTRAVENOUS | Status: DC
  Administered 2019-12-06 – 2019-12-07 (×4): 1000 mg via INTRAVENOUS
  Filled 2019-12-06 (×6): qty 200

## 2019-12-06 NOTE — Progress Notes (Signed)
Pharmacy Antibiotic Note  Mark [name] is a 59 y.o. male admitted on 12/04/2019 with cellulitis.  Pharmacy has been consulted for vancomycin dosing.  Patient is afebrile, WBC improving (14.3). CT negative for abscess or fluid. Pending evaluation for I&D.  Plan: Vancomycin 1000mg  IV every 8 hours.  Goal trough 10-15 mcg/mL.  Ceftriaxone 2g IV every 24 hours Monitor C&S, renal function, VT as needed  Height: 6\' 2"  (188 cm) Weight: 122 kg (269 lb) IBW/kg (Calculated) : 82.2  Temp (24hrs), Avg:98.1 F (36.7 C), Min:97.7 F (36.5 C), Max:98.6 F (37 C)  Recent Labs  Lab 12/04/19 1540 12/04/19 1715 12/04/19 2046 12/05/19 0226 12/05/19 0726 12/06/19 0152  WBC 19.2*  --   --  14.3*  --   --   CREATININE 2.29*  --  2.11* 1.57*  1.54* 1.27* 1.14  LATICACIDVEN 2.1* 2.2*  --   --   --   --     Estimated Creatinine Clearance: 96.8 mL/min (by C-G formula based on SCr of 1.14 mg/dL).    Allergies  Allergen Reactions  . Ozempic (0.25 Or 0.5 Mg-Dose) [Semaglutide(0.25 Or 0.5mg -Dos)] Other (See Comments)    Caused Pancreatitis  . Semaglutide Other (See Comments)    Caused pancreatitis  . Lyrica [Pregabalin] Rash    Antimicrobials this admission: Vanc 11/12 x1; 11/14> CTX 11/12>>  Dose adjustments this admission: N/A  Microbiology results: 11/12 Ucx>2k MRSA  11/12 Bcx> ngtd  Thank you for allowing pharmacy to be a part of this patient's care.  13/12, PharmD PGY1 Acute Care Pharmacy Resident Phone: [phone] 12/06/2019 9:19 AM  Please check [email or web address] for unit specific pharmacy phone numbers.

## 2019-12-06 NOTE — Progress Notes (Signed)
PROGRESS NOTE    Mark [name]  GXQ:[id number] DOB: January 07, 1961 DOA: 12/04/2019 PCP: [name]   Brief Narrative: Mark [name] is a 59 y.o. male with medical history significant for insulin-dependent type 2 diabetes, hypertension, hyperlipidemia, right AKA, depression, anxiety, PTSD. Patient presented secondary to dyspnea and skin wounds and found to have DKA. Insulin drip started. Also found to have cellulitis and Ceftriaxone started.   Assessment & Plan:   Principal Problem:   Diabetic ketoacidosis associated with type 2 diabetes mellitus (HCC) Active Problems:   AKI (acute kidney injury) (HCC)   Depression with anxiety   Hypertension associated with diabetes (HCC)   Hyperlipidemia associated with type 2 diabetes mellitus (HCC)   Cellulitis of left lower extremity   Cellulitis of lower back   Diabetic ketoacidosis Diabetes mellitus, type 2 Patient presented in DKA with venous pH of 7.1 and bicarbonate of 5.8, anion gap of 25 with a CO2 of 13. Secondary to patient not taking prescribed insulin which was secondary to poor oral intake. Patient started on insulin IV. Acidosis resolved and anion gap closed. -Transition to Lantus 30 minutes BID -Continue SSI  Cellulitis Located on right back and left upper lateral thigh with purulent drainage noted. Ultrasound of back significant for no abscess but still concerning clinically. -CT lumbar spine w/ contrast -Continue Ceftriaxone, add vancomycin -Continue analgesics prn -Will consult general surgery for I&D pending CT scan  MRSA bacteruria Asymptomatic.  AKI Baseline creatinine of 1 from 2019. Creatinine of 2.29 on admission. Secondary to dehydration from poor oral intake and hyperglycemia. Given IV fluids on admission and during administration of IV insulin. Back towards baseline.  Essential hypertension Patient is on Coreg, lisinopril, prazosin  Depression/anxiety -Continue Buspar,  Wellbutrin  Hyperlipidemia -Continue Lipitor  Pressure injury Sacrum, present on admission  Obesity Body mass index is 34.54 kg/m.   DVT prophylaxis: Heparin subq Code Status:   Code Status: Full Code Family Communication: None at bedside Disposition Plan: Discharge home pending workup for cellulitis, transition to oral antibiotics, transition to subcutaneous insulin   Consultants:   None  Procedures:   None  Antimicrobials:  Ceftriaxone  Vancomycin   Subjective: Pain is slightly improved but still significant. Hiccups. Not eating well.  Objective: Vitals:   12/05/19 2036 12/05/19 2344 12/06/19 0333 12/06/19 0838  BP: 134/85 (!) 143/72 135/69 (!) 167/102  Pulse:    (!) 127  Resp: 16 17 16 20   Temp: 98.2 F (36.8 C) 98 F (36.7 C) 97.9 F (36.6 C) 98.1 F (36.7 C)  TempSrc: Oral Oral Oral Oral  SpO2: 98% 96% 96% 98%  Weight:      Height:        Intake/Output Summary (Last 24 hours) at 12/06/2019 0910 Last data filed at 12/06/2019 0339 Gross per 24 hour  Intake 1563.89 ml  Output 1250 ml  Net 313.89 ml   Filed Weights   12/04/19 1453  Weight: 122 kg    Examination:  General exam: Appears calm and comfortable Respiratory system: Clear to auscultation. Respiratory effort normal. Cardiovascular system: S1 & S2 heard, Tachycardia with normal rhythm. No murmurs, rubs, gallops or clicks. Gastrointestinal system: Abdomen is nondistended, soft and nontender. No organomegaly or masses felt. Normal bowel sounds heard. Central nervous system: Alert and oriented. No focal neurological deficits. Musculoskeletal: No calf tenderness Skin: No cyanosis. Right lower back cellulitis with significant induration and tenderness. Left lateral upper thigh with draining wound with associated induration and tenderness Psychiatry: Judgement and insight appear normal.  Mood & affect appropriate.     Data Reviewed: I have personally reviewed following labs and imaging  studies  CBC Lab Results  Component Value Date   WBC 14.3 (H) 12/05/2019   RBC 4.27 12/05/2019   HGB 11.3 (L) 12/05/2019   HCT 35.0 (L) 12/05/2019   MCV 82.0 12/05/2019   MCH 26.5 12/05/2019   PLT 218 12/05/2019   MCHC 32.3 12/05/2019   RDW 14.7 12/05/2019   LYMPHSABS 0.7 12/04/2019   MONOABS 0.9 12/04/2019   EOSABS 0.0 12/04/2019   BASOSABS 0.0 12/04/2019     Last metabolic panel Lab Results  Component Value Date   NA 133 (L) 12/06/2019   K 4.0 12/06/2019   CL 102 12/06/2019   CO2 19 (L) 12/06/2019   BUN 29 (H) 12/06/2019   CREATININE 1.14 12/06/2019   GLUCOSE 277 (H) 12/06/2019   GFRNONAA >60 12/06/2019   GFRAA >60 12/26/2017   CALCIUM 9.0 12/06/2019   PROT 8.5 (H) 12/04/2019   ALBUMIN 3.4 (L) 12/04/2019   BILITOT 1.8 (H) 12/04/2019   ALKPHOS 125 12/04/2019   AST 8 (L) 12/04/2019   ALT 10 12/04/2019   ANIONGAP 12 12/06/2019    CBG (last 3)  Recent Labs    12/05/19 0412 12/05/19 0646 12/06/19 0621  GLUCAP 219* 203* 273*     GFR: Estimated Creatinine Clearance: 96.8 mL/min (by C-G formula based on SCr of 1.14 mg/dL).  Coagulation Profile: Recent Labs  Lab 12/04/19 1540  INR 1.1    Recent Results (from the past 240 hour(s))  Respiratory Panel by RT PCR (Flu A&B, Covid) - Nasopharyngeal Swab     Status: None   Collection Time: 12/04/19  3:24 PM   Specimen: Nasopharyngeal Swab  Result Value Ref Range Status   SARS Coronavirus 2 by RT PCR NEGATIVE NEGATIVE Final    Comment: (NOTE) SARS-CoV-2 target nucleic acids are NOT DETECTED.  The SARS-CoV-2 RNA is generally detectable in upper respiratoy specimens during the acute phase of infection. The lowest concentration of SARS-CoV-2 viral copies this assay can detect is 131 copies/mL. A negative result does not preclude SARS-Cov-2 infection and should not be used as the sole basis for treatment or other patient management decisions. A negative result may occur with  improper specimen  collection/handling, submission of specimen other than nasopharyngeal swab, presence of viral mutation(s) within the areas targeted by this assay, and inadequate number of viral copies (<131 copies/mL). A negative result must be combined with clinical observations, patient history, and epidemiological information. The expected result is Negative.  Fact Sheet for Patients:  https://www.moore.com/https://www.fda.gov/media/142436/download  Fact Sheet for Healthcare Providers:  https://www.young.biz/https://www.fda.gov/media/142435/download  This test is no t yet approved or cleared by the [place] FDA and  has been authorized for detection and/or diagnosis of SARS-CoV-2 by FDA under an Emergency Use Authorization (EUA). This EUA will remain  in effect (meaning this test can be used) for the duration of the COVID-19 declaration under Section 564(b)(1) of the Act, 21 U.S.C. section 360bbb-3(b)(1), unless the authorization is terminated or revoked sooner.     Influenza A by PCR NEGATIVE NEGATIVE Final   Influenza B by PCR NEGATIVE NEGATIVE Final    Comment: (NOTE) The Xpert Xpress SARS-CoV-2/FLU/RSV assay is intended as an aid in  the diagnosis of influenza from Nasopharyngeal swab specimens and  should not be used as a sole basis for treatment. Nasal washings and  aspirates are unacceptable for Xpert Xpress SARS-CoV-2/FLU/RSV  testing.  Fact Sheet for Patients: https://www.moore.com/https://www.fda.gov/media/142436/download  Fact Sheet for Healthcare Providers: https://www.young.biz/  This test is not yet approved or cleared by the [place] FDA and  has been authorized for detection and/or diagnosis of SARS-CoV-2 by  FDA under an Emergency Use Authorization (EUA). This EUA will remain  in effect (meaning this test can be used) for the duration of the  Covid-19 declaration under Section 564(b)(1) of the Act, 21  U.S.C. section 360bbb-3(b)(1), unless the authorization is  terminated or revoked. Performed at [facility], [address] [place], [place] [address]   Culture, blood (Routine x 2)     Status: None (Preliminary result)   Collection Time: 12/04/19  3:40 PM   Specimen: BLOOD LEFT WRIST  Result Value Ref Range Status   Specimen Description BLOOD LEFT WRIST  Final   Special Requests   Final    BOTTLES DRAWN AEROBIC ONLY Blood Culture results may not be optimal due to an inadequate volume of blood received in culture bottles   Culture   Final    NO GROWTH < 24 HOURS Performed at [facility], [address], [place], [place] [address]    Report Status PENDING  Incomplete  Culture, blood (Routine x 2)     Status: None (Preliminary result)   Collection Time: 12/04/19  3:44 PM   Specimen: BLOOD LEFT HAND  Result Value Ref Range Status   Specimen Description BLOOD LEFT HAND  Final   Special Requests   Final    BOTTLES DRAWN AEROBIC AND ANAEROBIC Blood Culture adequate volume   Culture   Final    NO GROWTH < 24 HOURS Performed at [facility], [address], [place], [place] [address]    Report Status PENDING  Incomplete  Urine culture     Status: Abnormal   Collection Time: 12/04/19  4:01 PM   Specimen: In/Out Cath Urine  Result Value Ref Range Status   Specimen Description IN/OUT CATH URINE  Final   Special Requests   Final    NONE Performed at [facility], [address], [place], [place] [address]    Culture (A)  Final    2,000 COLONIES/mL METHICILLIN RESISTANT STAPHYLOCOCCUS AUREUS   Report Status 12/06/2019 FINAL  Final   Organism ID, Bacteria METHICILLIN RESISTANT STAPHYLOCOCCUS AUREUS (A)  Final      Susceptibility   Methicillin resistant staphylococcus aureus - MIC*    CIPROFLOXACIN >=8 RESISTANT Resistant     GENTAMICIN <=0.5 SENSITIVE Sensitive     NITROFURANTOIN <=16 SENSITIVE Sensitive     OXACILLIN >=4 RESISTANT Resistant     TETRACYCLINE <=1 SENSITIVE Sensitive     VANCOMYCIN 1 SENSITIVE Sensitive     TRIMETH/SULFA <=10 SENSITIVE  Sensitive     CLINDAMYCIN <=0.25 SENSITIVE Sensitive     RIFAMPIN <=0.5 SENSITIVE Sensitive     Inducible Clindamycin NEGATIVE Sensitive     * 2,000 COLONIES/mL METHICILLIN RESISTANT STAPHYLOCOCCUS AUREUS        Radiology Studies: DG Chest Port 1 View  Result Date: 12/04/2019 CLINICAL DATA:  Shortness of breath EXAM: PORTABLE CHEST 1 VIEW COMPARISON:  None. FINDINGS: No focal opacity or pleural effusion. Normal cardiomediastinal silhouette. No pneumothorax. IMPRESSION: No active disease. Electronically Signed   By: Jasmine Pang M.D.   On: 12/04/2019 17:33   [name] CHEST SOFT TISSUE  Result Date: 12/05/2019 CLINICAL DATA:  Swelling, erythema, and pain in right lower back EXAM: ULTRASOUND OF HEAD/NECK SOFT TISSUES TECHNIQUE: Ultrasound examination of the head  and neck soft tissues was performed in the area of clinical concern. COMPARISON:  None. FINDINGS: Sonographic evaluation of the right lower back was performed. Normal subcutaneous fat is identified. No fluid collections or abscess. IMPRESSION: 1. Unremarkable superficial soft tissues of the right lower back. No fluid collection or abscess. Electronically Signed   By: Sharlet Salina M.D.   On: 12/05/2019 15:00        Scheduled Meds: . atorvastatin  80 mg Oral Q2000  . buPROPion  300 mg Oral Daily  . busPIRone  15 mg Oral TID  . carvedilol  12.5 mg Oral BID WC  . heparin  5,000 Units Subcutaneous Q8H  . insulin aspart  0-15 Units Subcutaneous TID WC  . insulin aspart  0-5 Units Subcutaneous QHS  . insulin aspart  10 Units Subcutaneous TID WC  . insulin glargine  30 Units Subcutaneous Daily  . prazosin  2 mg Oral QHS   Continuous Infusions: . cefTRIAXone (ROCEPHIN)  IV 2 g (12/05/19 1649)  . lactated ringers       LOS: 2 days     Jacquelin Hawking, MD [facility] 12/06/2019, 9:10 AM  If 7PM-7AM, please contact night-coverage [email or web address]

## 2019-12-06 NOTE — Plan of Care (Signed)
Continue to monitor

## 2019-12-07 ENCOUNTER — Ambulatory Visit (HOSPITAL_COMMUNITY): Payer: Medicare HMO | Admitting: Licensed Clinical Social Worker

## 2019-12-07 ENCOUNTER — Inpatient Hospital Stay (HOSPITAL_COMMUNITY): Payer: No Typology Code available for payment source

## 2019-12-07 ENCOUNTER — Ambulatory Visit (INDEPENDENT_AMBULATORY_CARE_PROVIDER_SITE_OTHER): Payer: No Typology Code available for payment source | Admitting: Licensed Clinical Social Worker

## 2019-12-07 DIAGNOSIS — F418 Other specified anxiety disorders: Secondary | ICD-10-CM | POA: Diagnosis not present

## 2019-12-07 DIAGNOSIS — L03312 Cellulitis of back [any part except buttock]: Secondary | ICD-10-CM | POA: Diagnosis not present

## 2019-12-07 DIAGNOSIS — N179 Acute kidney failure, unspecified: Secondary | ICD-10-CM | POA: Diagnosis not present

## 2019-12-07 DIAGNOSIS — F419 Anxiety disorder, unspecified: Secondary | ICD-10-CM | POA: Diagnosis not present

## 2019-12-07 DIAGNOSIS — F4310 Post-traumatic stress disorder, unspecified: Secondary | ICD-10-CM | POA: Diagnosis not present

## 2019-12-07 DIAGNOSIS — F331 Major depressive disorder, recurrent, moderate: Secondary | ICD-10-CM | POA: Diagnosis not present

## 2019-12-07 DIAGNOSIS — E1110 Type 2 diabetes mellitus with ketoacidosis without coma: Secondary | ICD-10-CM | POA: Diagnosis not present

## 2019-12-07 LAB — BASIC METABOLIC PANEL
Anion gap: 8 (ref 5–15)
BUN: 16 mg/dL (ref 6–20)
CO2: 24 mmol/L (ref 22–32)
Calcium: 9.1 mg/dL (ref 8.9–10.3)
Chloride: 103 mmol/L (ref 98–111)
Creatinine, Ser: 0.94 mg/dL (ref 0.61–1.24)
GFR, Estimated: >60 mL/min (ref >60)
Glucose, Bld: 199 mg/dL — ABNORMAL HIGH (ref 70–99)
Potassium: 3.3 mmol/L — ABNORMAL LOW (ref 3.5–5.1)
Sodium: 135 mmol/L (ref 135–145)

## 2019-12-07 LAB — CBC
HCT: 32.4 % — ABNORMAL LOW (ref 39.0–52.0)
Hemoglobin: 10.4 g/dL — ABNORMAL LOW (ref 13.0–17.0)
MCH: 26.2 pg (ref 26.0–34.0)
MCHC: 32.1 g/dL (ref 30.0–36.0)
MCV: 81.6 fL (ref 80.0–100.0)
Platelets: 186 10*3/uL (ref 150–400)
RBC: 3.97 MIL/uL — ABNORMAL LOW (ref 4.22–5.81)
RDW: 15.1 % (ref 11.5–15.5)
WBC: 13.2 10*3/uL — ABNORMAL HIGH (ref 4.0–10.5)
nRBC: 0 % (ref 0.0–0.2)

## 2019-12-07 LAB — PROCALCITONIN: Procalcitonin: 0.52 ng/mL

## 2019-12-07 LAB — GLUCOSE, CAPILLARY
Glucose-Capillary: 147 mg/dL — ABNORMAL HIGH (ref 70–99)
Glucose-Capillary: 104 mg/dL — ABNORMAL HIGH (ref 70–99)

## 2019-12-07 MED ORDER — CHLORHEXIDINE GLUCONATE CLOTH 2 % EX PADS
6.0000 | MEDICATED_PAD | Freq: Every day | CUTANEOUS | Status: DC
  Administered 2019-12-08 – 2019-12-19 (×12): 6 via TOPICAL

## 2019-12-07 MED ORDER — DOXYCYCLINE HYCLATE 100 MG PO TABS
100.0000 mg | ORAL_TABLET | Freq: Two times a day (BID) | ORAL | Status: DC
  Administered 2019-12-07 – 2019-12-08 (×2): 100 mg via ORAL
  Filled 2019-12-07 (×2): qty 1

## 2019-12-07 NOTE — Progress Notes (Signed)
PROGRESS NOTE    Mark [name]  XTG:[id number] DOB: [birth year] DOA: 12/04/2019 PCP: [name]   Brief Narrative: Mark [name] is a 59 y.o. male with medical history significant for insulin-dependent type 2 diabetes, hypertension, hyperlipidemia, right AKA, depression, anxiety, PTSD. Patient presented secondary to dyspnea and skin wounds and found to have DKA. Insulin drip started. Also found to have cellulitis and Ceftriaxone started.   Assessment & Plan:   Principal Problem:   Diabetic ketoacidosis associated with type 2 diabetes mellitus (HCC) Active Problems:   AKI (acute kidney injury) (HCC)   Depression with anxiety   Hypertension associated with diabetes (HCC)   Hyperlipidemia associated with type 2 diabetes mellitus (HCC)   Cellulitis of left lower extremity   Cellulitis of lower back   Diabetic ketoacidosis Diabetes mellitus, type 2 Patient presented in DKA with venous pH of 7.1 and bicarbonate of 5.8, anion gap of 25 with a CO2 of 13. Secondary to patient not taking prescribed insulin which was secondary to poor oral intake. Patient started on insulin IV. Acidosis resolved and anion gap closed. -Lantus 30 units daily, Novolog 10 units TID with meals -Continue SSI  Cellulitis Located on right back and left upper lateral thigh with purulent drainage noted. Ultrasound of back significant for no abscess but still concerning clinically. -CT lumbar spine w/ contrast -Continue Ceftriaxone and vancomycin -Continue analgesics prn -[name] of left thigh to assess for abscess  MRSA bacteruria Asymptomatic.  AKI Baseline creatinine of 1 from 2019. Creatinine of 2.29 on admission. Secondary to dehydration from poor oral intake and hyperglycemia. Given IV fluids on admission and during administration of IV insulin. Back towards baseline.  Essential hypertension Patient is on Coreg, lisinopril, prazosin  Depression/anxiety -Continue Buspar,  Wellbutrin  Hyperlipidemia -Continue Lipitor  Pressure injury Sacrum, present on admission  Obesity Body mass index is 34.54 kg/m.   DVT prophylaxis: Heparin subq Code Status:   Code Status: Full Code Family Communication: None at bedside Disposition Plan: Discharge home pending workup for cellulitis, transition to oral antibiotics, transition to subcutaneous insulin   Consultants:   None  Procedures:   None  Antimicrobials:  Ceftriaxone  Vancomycin   Subjective: Still with some pain but improved from admission. Left thigh drainage.  Objective: Vitals:   12/06/19 1952 12/06/19 2339 12/07/19 0426 12/07/19 0736  BP: (!) 154/74 (!) 118/59 (!) 151/70 (!) 144/69  Pulse: 90 94    Resp: 19 18 20    Temp: 97.9 F (36.6 C) 98.2 F (36.8 C) 98 F (36.7 C) 97.9 F (36.6 C)  TempSrc: Oral Oral Oral Oral  SpO2: 99% 99% 96%   Weight:      Height:        Intake/Output Summary (Last 24 hours) at 12/07/2019 1004 Last data filed at 12/06/2019 1817 Gross per 24 hour  Intake 808 ml  Output 1000 ml  Net -192 ml   Filed Weights   12/04/19 1453  Weight: 122 kg    Examination:  General exam: Appears calm and comfortable Respiratory system: Clear to auscultation. Respiratory effort normal. Cardiovascular system: S1 & S2 heard, RRR. No murmurs, rubs, gallops or clicks. Gastrointestinal system: Abdomen is nondistended, soft and nontender. No organomegaly or masses felt. Normal bowel sounds heard. Central nervous system: Alert and oriented. No focal neurological deficits. Musculoskeletal: Right flank with cellulitis, induration and tenderness. Left thigh with cellulitis and associated purulent drainage with tenderness and induration Skin: No cyanosis. No rashes Psychiatry: Judgement and insight appear normal. Mood &  affect appropriate.     Data Reviewed: I have personally reviewed following labs and imaging studies  CBC Lab Results  Component Value Date   WBC  14.0 (H) 12/06/2019   RBC 4.04 (L) 12/06/2019   HGB 10.5 (L) 12/06/2019   HCT 32.6 (L) 12/06/2019   MCV 80.7 12/06/2019   MCH 26.0 12/06/2019   PLT 187 12/06/2019   MCHC 32.2 12/06/2019   RDW 14.8 12/06/2019   LYMPHSABS 0.7 12/04/2019   MONOABS 0.9 12/04/2019   EOSABS 0.0 12/04/2019   BASOSABS 0.0 12/04/2019     Last metabolic panel Lab Results  Component Value Date   NA 135 12/07/2019   K 3.3 (L) 12/07/2019   CL 103 12/07/2019   CO2 24 12/07/2019   BUN 16 12/07/2019   CREATININE 0.94 12/07/2019   GLUCOSE 199 (H) 12/07/2019   GFRNONAA >60 12/07/2019   GFRAA >60 12/26/2017   CALCIUM 9.1 12/07/2019   PROT 8.5 (H) 12/04/2019   ALBUMIN 3.4 (L) 12/04/2019   BILITOT 1.8 (H) 12/04/2019   ALKPHOS 125 12/04/2019   AST 8 (L) 12/04/2019   ALT 10 12/04/2019   ANIONGAP 8 12/07/2019    CBG (last 3)  Recent Labs    12/05/19 0412 12/05/19 0646 12/06/19 0621  GLUCAP 219* 203* 273*     GFR: Estimated Creatinine Clearance: 117.4 mL/min (by C-G formula based on SCr of 0.94 mg/dL).  Coagulation Profile: Recent Labs  Lab 12/04/19 1540  INR 1.1    Recent Results (from the past 240 hour(s))  Respiratory Panel by RT PCR (Flu A&B, Covid) - Nasopharyngeal Swab     Status: None   Collection Time: 12/04/19  3:24 PM   Specimen: Nasopharyngeal Swab  Result Value Ref Range Status   SARS Coronavirus 2 by RT PCR NEGATIVE NEGATIVE Final    Comment: (NOTE) SARS-CoV-2 target nucleic acids are NOT DETECTED.  The SARS-CoV-2 RNA is generally detectable in upper respiratoy specimens during the acute phase of infection. The lowest concentration of SARS-CoV-2 viral copies this assay can detect is 131 copies/mL. A negative result does not preclude SARS-Cov-2 infection and should not be used as the sole basis for treatment or other patient management decisions. A negative result may occur with  improper specimen collection/handling, submission of specimen other than nasopharyngeal swab,  presence of viral mutation(s) within the areas targeted by this assay, and inadequate number of viral copies (<131 copies/mL). A negative result must be combined with clinical observations, patient history, and epidemiological information. The expected result is Negative.  Fact Sheet for Patients:  https://www.moore.com/  Fact Sheet for Healthcare Providers:  https://www.young.biz/  This test is no t yet approved or cleared by the [place] FDA and  has been authorized for detection and/or diagnosis of SARS-CoV-2 by FDA under an Emergency Use Authorization (EUA). This EUA will remain  in effect (meaning this test can be used) for the duration of the COVID-19 declaration under Section 564(b)(1) of the Act, 21 U.S.C. section 360bbb-3(b)(1), unless the authorization is terminated or revoked sooner.     Influenza A by PCR NEGATIVE NEGATIVE Final   Influenza B by PCR NEGATIVE NEGATIVE Final    Comment: (NOTE) The Xpert Xpress SARS-CoV-2/FLU/RSV assay is intended as an aid in  the diagnosis of influenza from Nasopharyngeal swab specimens and  should not be used as a sole basis for treatment. Nasal washings and  aspirates are unacceptable for Xpert Xpress SARS-CoV-2/FLU/RSV  testing.  Fact Sheet for Patients: https://www.moore.com/  Fact Sheet  for Healthcare Providers: https://www.young.biz/https://www.fda.gov/media/142435/download  This test is not yet approved or cleared by the [place] FDA and  has been authorized for detection and/or diagnosis of SARS-CoV-2 by  FDA under an Emergency Use Authorization (EUA). This EUA will remain  in effect (meaning this test can be used) for the duration of the  Covid-19 declaration under Section 564(b)(1) of the Act, 21  U.S.C. section 360bbb-3(b)(1), unless the authorization is  terminated or revoked. Performed at [facility], [address] [place], [place] [address]   Culture, blood  (Routine x 2)     Status: None (Preliminary result)   Collection Time: 12/04/19  3:40 PM   Specimen: BLOOD LEFT WRIST  Result Value Ref Range Status   Specimen Description BLOOD LEFT WRIST  Final   Special Requests   Final    BOTTLES DRAWN AEROBIC ONLY Blood Culture results may not be optimal due to an inadequate volume of blood received in culture bottles   Culture   Final    NO GROWTH 2 DAYS Performed at [facility], [address] [place], [place] [address]    Report Status PENDING  Incomplete  Culture, blood (Routine x 2)     Status: None (Preliminary result)   Collection Time: 12/04/19  3:44 PM   Specimen: BLOOD LEFT HAND  Result Value Ref Range Status   Specimen Description BLOOD LEFT HAND  Final   Special Requests   Final    BOTTLES DRAWN AEROBIC AND ANAEROBIC Blood Culture adequate volume   Culture   Final    NO GROWTH 2 DAYS Performed at [facility], [address] [place], [place] [address]    Report Status PENDING  Incomplete  Urine culture     Status: Abnormal   Collection Time: 12/04/19  4:01 PM   Specimen: In/Out Cath Urine  Result Value Ref Range Status   Specimen Description IN/OUT CATH URINE  Final   Special Requests   Final    NONE Performed at [facility], [address] [place], [place] [address]    Culture (A)  Final    2,000 COLONIES/mL METHICILLIN RESISTANT STAPHYLOCOCCUS AUREUS   Report Status 12/06/2019 FINAL  Final   Organism ID, Bacteria METHICILLIN RESISTANT STAPHYLOCOCCUS AUREUS (A)  Final      Susceptibility   Methicillin resistant staphylococcus aureus - MIC*    CIPROFLOXACIN >=8 RESISTANT Resistant     GENTAMICIN <=0.5 SENSITIVE Sensitive     NITROFURANTOIN <=16 SENSITIVE Sensitive     OXACILLIN >=4 RESISTANT Resistant     TETRACYCLINE <=1 SENSITIVE Sensitive     VANCOMYCIN 1 SENSITIVE Sensitive     TRIMETH/SULFA <=10 SENSITIVE Sensitive     CLINDAMYCIN <=0.25 SENSITIVE Sensitive     RIFAMPIN <=0.5 SENSITIVE  Sensitive     Inducible Clindamycin NEGATIVE Sensitive     * 2,000 COLONIES/mL METHICILLIN RESISTANT STAPHYLOCOCCUS AUREUS        Radiology Studies: CT LUMBAR SPINE W CONTRAST  Result Date: 12/06/2019 CLINICAL DATA:  Low back pain. Regional cellulitis. Question back infection. EXAM: CT LUMBAR SPINE WITH  CONTRAST TECHNIQUE: Multidetector CT imaging of the lumbar spine was performed without and with intravenous contrast administration. Multiplanar CT image reconstructions were also generated. CONTRAST:  75mL OMNIPAQUE IOHEXOL 300 MG/ML  SOLN COMPARISON:  None. FINDINGS: Segmentation: 5 lumbar type vertebral bodies. Alignment: Normal Vertebrae: No fracture or primary bone finding. No finding to suggest regional infection of the spine. Paraspinal and other  soft tissues: Inflammatory changes of the posterior soft tissues including the subcutaneous fat, left more than right. No sign of deep space extension. Disc levels: No abnormality from T11-12 through L1-2. L2-3: Mild bulging of the disc. Bilateral facet osteoarthritis. Mild canal narrowing but without visible neural compression. L3-4: Normal interspace. L4-5: Normal interspace. L5-S1: Mild facet hypertrophy.  No stenosis. Solid bridging osteophytes of the right SI joint. IMPRESSION: 1. Inflammatory changes of the posterior soft tissues including the subcutaneous fat, left more than right. No sign of deep space extension. No evidence of lumbar region fracture or bone destruction. No sign of discitis. 2. 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 This could contribute to back pain. 3. Solid bridging osteophytes of the right SI joint. Electronically Signed   By: Paulina Fusi M.D.   On: 12/06/2019 12:34   [name] CHEST SOFT TISSUE  Result Date: 12/05/2019 CLINICAL DATA:  Swelling, erythema, and pain in right lower back EXAM: ULTRASOUND OF HEAD/NECK SOFT TISSUES TECHNIQUE: Ultrasound examination of the head and neck soft tissues was performed in  the area of clinical concern. COMPARISON:  None. FINDINGS: Sonographic evaluation of the right lower back was performed. Normal subcutaneous fat is identified. No fluid collections or abscess. IMPRESSION: 1. Unremarkable superficial soft tissues of the right lower back. No fluid collection or abscess. Electronically Signed   By: Sharlet Salina M.D.   On: 12/05/2019 15:00        Scheduled Meds: . atorvastatin  80 mg Oral Q2000  . buPROPion  300 mg Oral Daily  . busPIRone  15 mg Oral TID  . carvedilol  12.5 mg Oral BID WC  . heparin  5,000 Units Subcutaneous Q8H  . insulin aspart  0-15 Units Subcutaneous TID WC  . insulin aspart  0-5 Units Subcutaneous QHS  . insulin aspart  10 Units Subcutaneous TID WC  . insulin glargine  30 Units Subcutaneous Daily  . prazosin  2 mg Oral QHS   Continuous Infusions: . cefTRIAXone (ROCEPHIN)  IV 2 g (12/06/19 1730)  . vancomycin 1,000 mg (12/07/19 0538)     LOS: 3 days     Jacquelin Hawking, MD [facility] 12/07/2019, 10:04 AM  If 7PM-7AM, please contact night-coverage [email or web address]

## 2019-12-07 NOTE — Progress Notes (Signed)
IV difficulties. Will transition Vancomycin to Doxycycline. ID consult in AM.  Jacquelin Hawking, MD [facility] 12/07/2019, 6:29 PM

## 2019-12-07 NOTE — Progress Notes (Addendum)
VAST consulted to place midline as patient has extremely limited venous access. Paged and spoke with Dr. Tish Frederickson regarding patient's plan of care. VAST RN with concern for midline d/t high dose of vancomycin being administered every 8 hrs as well as lab having difficulty collecting lab work; asked Dr. Caleb Popp if he would order a PICC be placed tomorrow. Dr. Caleb Popp verbalized he was unsure that patient has true need for PICC based on current plan of care; he plans to speak with infectious disease tomorrow and possibly change antibiotics to oral route.  Seeing as patient currently has working IV in place, Dr. Caleb Popp verbalized to disregard placing midline at this time.

## 2019-12-07 NOTE — Progress Notes (Signed)
Inpatient Diabetes Program Recommendations  AACE/ADA: New Consensus Statement on Inpatient Glycemic Control (2015)  Target Ranges:  Prepandial:   less than 140 mg/dL      Peak postprandial:   less than 180 mg/dL (1-2 hours)      Critically ill patients:  140 - 180 mg/dL   Lab Results  Component Value Date   GLUCAP 273 (H) 12/06/2019   HGBA1C 10.3 (H) 12/04/2019    Diabetes history: Type 2 DM Outpatient Diabetes medications: Novolog 15 units TID, Lantus 50 units QAM/25 units QPM, Actos 15 mg QD Current orders for Inpatient glycemic control: Lantus 30 units QD, Novolog 10 units TID, Novolog 0-15 units TID, Novolog 0-5 units QHS  Inpatient Diabetes Program Recommendations:    Spoke with patient regarding outpatient diabetes management. Verified home medications and patient reports taking medications as prescribed, until he became sick prior to hospitalization.  Reviewed patient's current A1c of 10.3%. Explained what a A1c is and what it measures. Also reviewed goal A1c with patient, importance of good glucose control @ home, and blood sugar goals. Reviewed patho of DM, DKA, impact of glycemic control on infection, vascular changes and comorbidities.  Patient has a meter and check blood sugars 2-4 times per day. He reports his biggest struggle is that if blood sugars is high, he doesn't eat, however does not have a sliding scale for home use.  Patient has no trouble obtaining medications. An endocrinologist may be helpful resource. Will attach to discharge summary and place referral for [facility]. Patient has no further questions at this time.   Thanks, Lujean Rave, MSN, RNC-OB Diabetes Coordinator [phone] (8a-5p)

## 2019-12-08 ENCOUNTER — Encounter (HOSPITAL_COMMUNITY): Payer: Self-pay | Admitting: Licensed Clinical Social Worker

## 2019-12-08 ENCOUNTER — Encounter (HOSPITAL_COMMUNITY): Payer: Self-pay | Admitting: Internal Medicine

## 2019-12-08 DIAGNOSIS — L0591 Pilonidal cyst without abscess: Secondary | ICD-10-CM

## 2019-12-08 DIAGNOSIS — L03312 Cellulitis of back [any part except buttock]: Secondary | ICD-10-CM | POA: Diagnosis not present

## 2019-12-08 DIAGNOSIS — L03116 Cellulitis of left lower limb: Secondary | ICD-10-CM | POA: Diagnosis not present

## 2019-12-08 DIAGNOSIS — N179 Acute kidney failure, unspecified: Secondary | ICD-10-CM | POA: Diagnosis not present

## 2019-12-08 DIAGNOSIS — L8990 Pressure ulcer of unspecified site, unspecified stage: Secondary | ICD-10-CM | POA: Insufficient documentation

## 2019-12-08 DIAGNOSIS — F418 Other specified anxiety disorders: Secondary | ICD-10-CM | POA: Diagnosis not present

## 2019-12-08 DIAGNOSIS — E1110 Type 2 diabetes mellitus with ketoacidosis without coma: Secondary | ICD-10-CM | POA: Diagnosis not present

## 2019-12-08 DIAGNOSIS — E1165 Type 2 diabetes mellitus with hyperglycemia: Secondary | ICD-10-CM

## 2019-12-08 DIAGNOSIS — B9562 Methicillin resistant Staphylococcus aureus infection as the cause of diseases classified elsewhere: Secondary | ICD-10-CM | POA: Diagnosis not present

## 2019-12-08 LAB — CBC
HCT: 33.6 % — ABNORMAL LOW (ref 39.0–52.0)
Hemoglobin: 10.7 g/dL — ABNORMAL LOW (ref 13.0–17.0)
MCH: 26.2 pg (ref 26.0–34.0)
MCHC: 31.8 g/dL (ref 30.0–36.0)
MCV: 82.2 fL (ref 80.0–100.0)
Platelets: 215 10*3/uL (ref 150–400)
RBC: 4.09 MIL/uL — ABNORMAL LOW (ref 4.22–5.81)
RDW: 15 % (ref 11.5–15.5)
WBC: 12 10*3/uL — ABNORMAL HIGH (ref 4.0–10.5)
nRBC: 0 % (ref 0.0–0.2)

## 2019-12-08 LAB — GLUCOSE, CAPILLARY
Glucose-Capillary: 170 mg/dL — ABNORMAL HIGH (ref 70–99)
Glucose-Capillary: 156 mg/dL — ABNORMAL HIGH (ref 70–99)
Glucose-Capillary: 127 mg/dL — ABNORMAL HIGH (ref 70–99)

## 2019-12-08 LAB — BASIC METABOLIC PANEL
Anion gap: 9 (ref 5–15)
BUN: 16 mg/dL (ref 6–20)
CO2: 23 mmol/L (ref 22–32)
Calcium: 9.2 mg/dL (ref 8.9–10.3)
Chloride: 104 mmol/L (ref 98–111)
Creatinine, Ser: 0.99 mg/dL (ref 0.61–1.24)
GFR, Estimated: >60 mL/min (ref >60)
Glucose, Bld: 141 mg/dL — ABNORMAL HIGH (ref 70–99)
Potassium: 3.3 mmol/L — ABNORMAL LOW (ref 3.5–5.1)
Sodium: 136 mmol/L (ref 135–145)

## 2019-12-08 LAB — PROCALCITONIN: Procalcitonin: 0.44 ng/mL

## 2019-12-08 MED ORDER — POTASSIUM CHLORIDE CRYS ER 20 MEQ PO TBCR
40.0000 meq | EXTENDED_RELEASE_TABLET | Freq: Once | ORAL | Status: AC
  Administered 2019-12-08: 40 meq via ORAL
  Filled 2019-12-08: qty 2

## 2019-12-08 MED ORDER — VANCOMYCIN HCL 1250 MG/250ML IV SOLN
1250.0000 mg | Freq: Two times a day (BID) | INTRAVENOUS | Status: DC
  Administered 2019-12-08 – 2019-12-11 (×7): 1250 mg via INTRAVENOUS
  Filled 2019-12-08 (×9): qty 250

## 2019-12-08 MED ORDER — SODIUM CHLORIDE 0.9 % IV SOLN
2.0000 g | Freq: Three times a day (TID) | INTRAVENOUS | Status: DC
  Administered 2019-12-08 – 2019-12-12 (×11): 2 g via INTRAVENOUS
  Filled 2019-12-08 (×14): qty 2

## 2019-12-08 MED ORDER — METRONIDAZOLE 500 MG PO TABS
500.0000 mg | ORAL_TABLET | Freq: Three times a day (TID) | ORAL | Status: DC
  Administered 2019-12-08 – 2019-12-11 (×9): 500 mg via ORAL
  Filled 2019-12-08 (×9): qty 1

## 2019-12-08 NOTE — Progress Notes (Signed)
Inpatient Diabetes Program Recommendations  AACE/ADA: New Consensus Statement on Inpatient Glycemic Control (2015)  Target Ranges:  Prepandial:   less than 140 mg/dL      Peak postprandial:   less than 180 mg/dL (1-2 hours)      Critically ill patients:  140 - 180 mg/dL   Lab Results  Component Value Date   GLUCAP 170 (H) 12/08/2019   HGBA1C 10.3 (H) 12/04/2019    Review of Glycemic Control Results for Mark [name], Mark [name] (MRN [id number]) as of 12/08/2019 11:41  Ref. Range 12/07/2019 17:20 12/07/2019 21:43 12/08/2019 06:27  Glucose-Capillary Latest Ref Range: 70 - 99 mg/dL 443 (H) 154 (H) 008 (H)   Diabetes history: Type 2 DM Outpatient Diabetes medications: Novolog 15-30 units TID, Lantus 50 units QAM/25 units QPM, Actos 15 mg QD Current orders for Inpatient glycemic control: Lantus 30 units QD, Novolog 10 units TID, Novolog 0-15 units TID, Novolog 0-5 units QHS  Inpatient Diabetes Program Recommendations:    Spoke with patient again to further review and reinforce concepts of discussion from 11/15. Patient reviewed home regimen and verifies insulin administration.  Following review, patient adjusts meal coverage based on total carbohydrates consumed.  Discussed current inpatient insulin requirements, insulin dosages, total carbs consumed, concern for change in carb ratio, impact of infection on glycemic control. Patient feels A1C increased following areas of cellulitis making control difficult. Given this, patient needs closer monitoring and further instruction for insulin when at home.  Patient has a meter and check blood sugars 2-4 times per day. He reports his biggest struggle is that if blood sugars is high, he doesn't eat, however does not have a sliding scale for home use. Feel patient could benefit from [facility] to closely monitor CBGS post prandially. Education provided on [place], application, cost, benefits and risks. Patient is very interested in moving forward. Text paged  MD for order and for SSI +MC at discharge.  Patient is followed by [facility] endocrinology in [place]. Has not seen outpatient recently. Stressed importance of making appointment.  At discharge: Consider adding a sliding scale in addition to current home regimen.   Thanks, Lujean Rave, MSN, RNC-OB Diabetes Coordinator [phone] (8a-5p)

## 2019-12-08 NOTE — Consult Note (Addendum)
[facility]                                                                                        Patient Identification: Patient Name: Mark [name] MRN: [id number] Admit Date: 12/04/2019  2:43 PM Today's Date: 12/08/2019 Reason for consult:   Principal Problem:   Diabetic ketoacidosis associated with type 2 diabetes mellitus (HCC) Active Problems:   AKI (acute kidney injury) (HCC)   Depression with anxiety   Hypertension associated with diabetes (HCC)   Hyperlipidemia associated with type 2 diabetes mellitus (HCC)   Cellulitis of left lower extremity   Cellulitis of lower back   Pressure injury of skin   Antibiotics: Vancomycin Day 3, ceftriaxone day 5 and Doxycyline day 2  Assessment 1. Multiple sites of cellulitis ( Left Lower back, Rt Lower back and Left Posterior thigh) - Has significant erythema/tenderness and induration present in all of these sites and would recommend to continue IV abx - No crepitus on exam - No abscess noted in CT scan  ' 2. Healing Pilonidal Cyst in the Gluteal Fold 3. MRSA in Urine cx - no urinary symptoms. UA not concerning for UTI 4. Poorly controlled DM  5. DKA resolved  Recommendations  Would continue Vancomycin, pharmacy to dose Switch ceftriaxone to cefepime  Add Metronidazole for anaerobic coverage given location of the cellulitis with poorly controlled DM  Will reassess tomorrow  Rest of the management as per the primary team. Please call us with questions.  Thank you for the consult.  Odette Fraction, MD Infectious Diseases  [facility]   To contact the attending provider between 8A-5P or the covering provider during after hours 5P-8A, please log into the web site [email or web address] and access using universal [place] password for that web site. If you do not have the password, please call the hospital  operator. __________________________________________________________________________________________________________ HPI and Hospital Course: Mark [name] is a 59 y.o. male with a PMH of poorly controlled DM, HTN, hyperlipidemia, right-sided AKA who presented to the ED with complaints of feeling poorly and 2 areas of cellulituis in his rt and left lateral back. In Ed, he was found to be septic/DKA and was started on broad spectrum abx. Patient says he had a pilonidal cyst back in January 2021 that was managed with wound vac up  until March 2021. He was admitted in August at [place] with cellulitis of the Left lower back which was I and D'ed. Reportedly, he was also in ICU at that time. He still has a surgical wound at that site. 3 days prior to admit, he noticed redness/pain and swelling at the Rt lower back and Left posterior thigh.   Work up so far includes  [name] rt lower back being [employer].  CT Lumbar spine W contrast - Inflammatory changes of the posterior soft tissues including the subcutaneous fat, left more than right. No sign of deep space extension. No evidence of lumbar region fracture or bone destruction. No sign of discitis. [name] Left thigh Soft tissue swelling in the posterolateral left thigh without abnormal fluid, mass, or abscess evident.   ROS:  General- Denies fever, chills, loss of appetite and loss of weight HEENT - Denies headache, blurry vision, neck pain, sinus pain Chest - Denies any chest pain, SOB or cough CVS- Denies any dizziness/lightheadedness, syncopal attacks, palpitations Abdomen- Denies any nausea, vomiting, abdominal pain, hematochezia and diarrhea Neuro - Denies any weakness, numbness, tingling sensation Psych - Denies any changes in mood irritability or depressive symptoms GU- Denies any burning, dysuria, hematuria or increased frequency of urination MSK - denies any joint pain/swelling or restricted ROM   Past Medical History:  Diagnosis Date  . Above knee  amputation status, right   . Anxiety   . Depression   . Diabetes mellitus, type II (HCC)   . Hyperlipemia   . Hypertension   . PTSD (post-traumatic stress disorder)   . Shoulder pain     Scheduled Meds: . atorvastatin  80 mg Oral Q2000  . buPROPion  300 mg Oral Daily  . busPIRone  15 mg Oral TID  . carvedilol  12.5 mg Oral BID WC  . Chlorhexidine Gluconate Cloth  6 each Topical Q0600  . doxycycline  100 mg Oral Q12H  . heparin  5,000 Units Subcutaneous Q8H  . insulin aspart  0-15 Units Subcutaneous TID WC  . insulin aspart  0-5 Units Subcutaneous QHS  . insulin aspart  10 Units Subcutaneous TID WC  . insulin glargine  30 Units Subcutaneous Daily  . prazosin  2 mg Oral QHS   Continuous Infusions: . cefTRIAXone (ROCEPHIN)  IV Stopped (12/07/19 1903)   PRN Meds:.acetaminophen, dextrose, HYDROmorphone (DILAUDID) injection, hydrOXYzine, ondansetron (ZOFRAN) IV, oxyCODONE-acetaminophen  Allergies  Allergen Reactions  . Ozempic (0.25 Or 0.5 Mg-Dose) [Semaglutide(0.25 Or 0.5mg -Dos)] Other (See Comments)    Caused Pancreatitis  . Semaglutide Other (See Comments)    Caused pancreatitis  . Lyrica [Pregabalin] Rash    Social History   Socioeconomic History  . Marital status: Married    Spouse name: Not on file  . Number of children: Not on file  . Years of education: Not on file  . Highest education level: Not on file  Occupational History  . Not on file  Tobacco Use  . Smoking status: Former Smoker    Quit date: 08/22/2019    Years since quitting: 0.2  . Smokeless tobacco: Never Used  Vaping Use  . Vaping Use: Never used  Substance and Sexual Activity  . Alcohol use: Yes    Alcohol/week: 4.0 standard drinks    Types: 2 Cans of beer, 2 Shots of liquor per week    Comment: Reports light drinking weekly.  . Drug use: Yes    Frequency: 14.0 times per week    Types: Marijuana    Comment: Uses 1-2 times a day per report for his anxiety  . Sexual activity: Not Currently   Other Topics Concern  . Not on file  Social History Narrative  . Not on file   Social Determinants of Health   Financial Resource Strain:   . Difficulty of Paying Living Expenses: Not on file  Food Insecurity:   . Worried About Programme researcher, broadcasting/film/video in the Last Year: Not on file  . Ran Out of Food in the Last Year: Not on file  Transportation Needs:   . Lack of Transportation (Medical): Not on file  . Lack of Transportation (Non-Medical): Not on file  Physical Activity:   . Days of Exercise per Week: Not on file  . Minutes of Exercise per Session: Not on file  Stress:   . Feeling of Stress : Not on file  Social Connections:   . Frequency of Communication with Friends and Family: Not on file  . Frequency of Social Gatherings with Friends and Family: Not on file  . Attends Religious Services: Not on file  . Active Member of Clubs or Organizations: Not on file  . Attends Banker Meetings: Not on file  . Marital Status: Not on file  Intimate Partner Violence:   . Fear of Current or Ex-Partner: Not on file  . Emotionally Abused: Not on file  . Physically Abused: Not on file  . Sexually Abused: Not on file    Vitals  BP 124/75 (BP Location: Left Arm)   Pulse 95   Temp 98 F (36.7 C) (Oral)   Resp 18   Ht 6\' 2"  (1.88 m)   Wt 122 kg   SpO2 100%   BMI 34.54 kg/m    Physical Exam Constitutional:  NAD    Comments:   Cardiovascular:     Rate and Rhythm: Normal rate and regular rhythm.     Heart sounds:  Pulmonary:     Effort: Pulmonary effort is normal.     Comments: Clear air bilaterally  Abdominal:     Palpations: Abdomen is soft. BS+    Tenderness: Non tender  Musculoskeletal:        General: No swelling or tenderness. Rt AKA  Skin:    Comments: 2 AREAS OF CELLULITIS IN THE RT AND LEFT LATERAL BACK WITH SIGNIFICANT ERYTHEMA AND INDURATION. THE AREA IN THE LEFT IS OPEN WITH MINIMAL DRAINAGE.  LEFT POSTERIOR THIGH ERYTHEMA AND  TENDERNESS   Neurological:     General: No focal deficit present.   Psychiatric:        Mood and Affect: Mood normal.    LINES/TUBES: PIV  METAL IMPLANT/HARDWARE:  Pertinent Microbiology Results for orders placed or performed during the hospital encounter of 12/04/19  Respiratory Panel by RT PCR (Flu A&B, Covid) - Nasopharyngeal Swab     Status: None   Collection Time: 12/04/19  3:24 PM   Specimen: Nasopharyngeal Swab  Result Value Ref Range Status   SARS Coronavirus 2 by RT PCR NEGATIVE NEGATIVE Final    Comment: (NOTE) SARS-CoV-2 target nucleic acids are NOT DETECTED.  The SARS-CoV-2 RNA is generally detectable in upper respiratoy specimens during the acute phase of infection. The lowest concentration of SARS-CoV-2 viral copies this assay can detect is 131 copies/mL. A negative result does not preclude SARS-Cov-2 infection and should not be used as the sole basis for treatment or other patient management decisions. A negative result may occur with  improper specimen collection/handling, submission of specimen other than nasopharyngeal swab, presence of viral mutation(s) within the areas targeted by this assay, and inadequate number of viral copies (<131 copies/mL). A negative result must be combined with clinical observations, patient history, and epidemiological information. The expected result is Negative.  Fact Sheet for Patients:  13/12/21  Fact Sheet for Healthcare Providers:  https://www.moore.com/  This test is no t yet approved or cleared by the https://www.young.biz/ FDA and  has been authorized for detection and/or diagnosis of SARS-CoV-2 by FDA under an Emergency Use Authorization (EUA). This EUA will remain  in effect (meaning this test can be used) for the duration of the COVID-19 declaration under Section 564(b)(1) of the Act, 21 U.S.C. section 360bbb-3(b)(1), unless the authorization is terminated or revoked  sooner.     Influenza A by  PCR NEGATIVE NEGATIVE Final   Influenza B by PCR NEGATIVE NEGATIVE Final    Comment: (NOTE) The Xpert Xpress SARS-CoV-2/FLU/RSV assay is intended as an aid in  the diagnosis of influenza from Nasopharyngeal swab specimens and  should not be used as a sole basis for treatment. Nasal washings and  aspirates are unacceptable for Xpert Xpress SARS-CoV-2/FLU/RSV  testing.  Fact Sheet for Patients: https://www.moore.com/https://www.fda.gov/media/142436/download  Fact Sheet for Healthcare Providers: https://www.young.biz/https://www.fda.gov/media/142435/download  This test is not yet approved or cleared by the [place] FDA and  has been authorized for detection and/or diagnosis of SARS-CoV-2 by  FDA under an Emergency Use Authorization (EUA). This EUA will remain  in effect (meaning this test can be used) for the duration of the  Covid-19 declaration under Section 564(b)(1) of the Act, 21  U.S.C. section 360bbb-3(b)(1), unless the authorization is  terminated or revoked. Performed at [facility], [address] [place], [place] [address]   Culture, blood (Routine x 2)     Status: None (Preliminary result)   Collection Time: 12/04/19  3:40 PM   Specimen: BLOOD LEFT WRIST  Result Value Ref Range Status   Specimen Description BLOOD LEFT WRIST  Final   Special Requests   Final    BOTTLES DRAWN AEROBIC ONLY Blood Culture results may not be optimal due to an inadequate volume of blood received in culture bottles   Culture   Final    NO GROWTH 3 DAYS Performed at [facility], [address] [place], [place] [address]    Report Status PENDING  Incomplete  Culture, blood (Routine x 2)     Status: None (Preliminary result)   Collection Time: 12/04/19  3:44 PM   Specimen: BLOOD LEFT HAND  Result Value Ref Range Status   Specimen Description BLOOD LEFT HAND  Final   Special Requests   Final    BOTTLES DRAWN AEROBIC AND ANAEROBIC Blood Culture adequate volume   Culture   Final    NO  GROWTH 3 DAYS Performed at [facility], [address] [place], [place] [address]    Report Status PENDING  Incomplete  Urine culture     Status: Abnormal   Collection Time: 12/04/19  4:01 PM   Specimen: In/Out Cath Urine  Result Value Ref Range Status   Specimen Description IN/OUT CATH URINE  Final   Special Requests   Final    NONE Performed at [facility], [address] [place], [place] [address]    Culture (A)  Final    2,000 COLONIES/mL METHICILLIN RESISTANT STAPHYLOCOCCUS AUREUS   Report Status 12/06/2019 FINAL  Final   Organism ID, Bacteria METHICILLIN RESISTANT STAPHYLOCOCCUS AUREUS (A)  Final      Susceptibility   Methicillin resistant staphylococcus aureus - MIC*    CIPROFLOXACIN >=8 RESISTANT Resistant     GENTAMICIN <=0.5 SENSITIVE Sensitive     NITROFURANTOIN <=16 SENSITIVE Sensitive     OXACILLIN >=4 RESISTANT Resistant     TETRACYCLINE <=1 SENSITIVE Sensitive     VANCOMYCIN 1 SENSITIVE Sensitive     TRIMETH/SULFA <=10 SENSITIVE Sensitive     CLINDAMYCIN <=0.25 SENSITIVE Sensitive     RIFAMPIN <=0.5 SENSITIVE Sensitive     Inducible Clindamycin NEGATIVE Sensitive     * 2,000 COLONIES/mL METHICILLIN RESISTANT STAPHYLOCOCCUS AUREUS   Pertinent Lab seen by me: CBC Latest Ref Rng & Units 12/08/2019 12/07/2019 12/06/2019  WBC 4.0 - 10.5 K/uL 12.0(H) 13.2(H) 14.0(H)  Hemoglobin 13.0 -  17.0 g/dL 10.7(L) 10.4(L) 10.5(L)  Hematocrit 39 - 52 % 33.6(L) 32.4(L) 32.6(L)  Platelets 150 - 400 K/uL 215 186 187   CMP Latest Ref Rng & Units 12/08/2019 12/07/2019 12/06/2019  Glucose 70 - 99 mg/dL 222(L) 798(X) 211(H)  BUN 6 - 20 mg/dL 16 16 41(D)  Creatinine 0.61 - 1.24 mg/dL 4.08 1.44 8.18  Sodium 135 - 145 mmol/L 136 135 133(L)  Potassium 3.5 - 5.1 mmol/L 3.3(L) 3.3(L) 4.0  Chloride 98 - 111 mmol/L 104 103 102  CO2 22 - 32 mmol/L 23 24 19(L)  Calcium 8.9 - 10.3 mg/dL 9.2 9.1 9.0  Total Protein 6.5 - 8.1 g/dL - - -  Total Bilirubin 0.3 - 1.2 mg/dL - - -   Alkaline Phos 38 - 126 U/L - - -  AST 15 - 41 U/L - - -  ALT 0 - 44 U/L - - -    Pertinent Imagings/Other Imagings Plain films and CT images have been personally visualized and interpreted; radiology reports have been reviewed. Decision making incorporated into the Impression / Recommendations.  CT lumbar spine W contrast 12/06/19 FINDINGS: Segmentation: 5 lumbar type vertebral bodies.  Alignment: Normal  Vertebrae: No fracture or primary bone finding. No finding to suggest regional infection of the spine.  Paraspinal and other soft tissues: Inflammatory changes of the posterior soft tissues including the subcutaneous fat, left more than right. No sign of deep space extension.  Disc levels: No abnormality from T11-12 through L1-2.  L2-3: Mild bulging of the disc. Bilateral facet osteoarthritis. Mild canal narrowing but without visible neural compression.  L3-4: Normal interspace.  L4-5: Normal interspace.  L5-S1: Mild facet hypertrophy.  No stenosis.  Solid bridging osteophytes of the right SI joint.  IMPRESSION: 1. Inflammatory changes of the posterior soft tissues including the subcutaneous fat, left more than right. No sign of deep space extension. No evidence of lumbar region fracture or bone destruction. No sign of discitis. 2. 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 This could contribute to back pain. 3. Solid bridging osteophytes of the right SI joint.  I have spent greater than 45  minutes for this patient encounter including review of prior medical records with greater than 50% of time being face to face and coordination of their care.

## 2019-12-08 NOTE — Progress Notes (Signed)
Inpatient Diabetes Program Recommendations  AACE/ADA: New Consensus Statement on Inpatient Glycemic Control (2015)  Target Ranges:  Prepandial:   less than 140 mg/dL      Peak postprandial:   less than 180 mg/dL (1-2 hours)      Critically ill patients:  140 - 180 mg/dL   Lab Results  Component Value Date   GLUCAP 170 (H) 12/08/2019   HGBA1C 10.3 (H) 12/04/2019    Provided patient with FreeStyle Libre 2 CGM after obtaining verbal order from Dr. Caleb Popp.  Patient is aware how to use and how to place.  All question answered.    Will continue to follow while inpatient.  Thank you, Dulce Sellar, RN, BSN Diabetes Coordinator Inpatient Diabetes Program [phone] (team pager from 8a-5p)

## 2019-12-08 NOTE — Consult Note (Signed)
WOC Nurse Consult Note: Patient receiving care in [facility] 4E27. Reason for Consult: "pressure injuries" Wound type: Healing pilonidal cyst in gluteal fold, and indurated, draining area on left posterior/lateral thigh with induration and TTP Pressure Injury POA: Yes/No/NA Measurement: no Wound bed: Gluteal fold area has 2 distinct areas, the more superior location is a very small hole opening without any drainage.  Below this on the left upper buttock fold there is a small area consistent with MASD-ITD.   On the left upper posterior/lateral thigh there is an area of induration with a pinhole area that is draining creamy tan secretions, and it is TTP. Drainage (amount, consistency, odor)  Periwound: intact Dressing procedure/placement/frequency: Wash left upper posterior thigh wound and gluteal fold with soap and water. Place a strip of Aquacel [place] [phone]) into the gluteal fold, and a piece over the thigh wound, then cover with a foam dressing.  Change daily.  Infectious disease specialty service is in and participating in the POC.  Records have been requested from [facility].    Monitor the wound area(s) for worsening of condition such as: Signs/symptoms of infection,  Increase in size,  Development of or worsening of odor, Development of pain, or increased pain at the affected locations.  Notify the medical team if any of these develop.  Thank you for the consult.  Discussed plan of care with the patient and bedside nurse.  WOC nurse will not follow at this time.  Please re-consult the WOC team if needed.  Helmut Muster, RN, MSN, CWOCN, CNS-BC, pager [phone]

## 2019-12-08 NOTE — Progress Notes (Signed)
Virtual Visit via Video Note  I connected with Mark [name] on 12/08/19 at 5:00 pm EST by a video enabled telemedicine application and verified that I am speaking with the correct person using two identifiers.   I discussed the limitations of evaluation and management by telemedicine and the availability of in person appointments. The patient expressed understanding and agreed to proceed.  LOCATION: Patient: Home Provider: home Office  History of Present Illness: [facility], anxiety.    Observation/Objective:  Patient is currently in the hosptial for Medical stabilization: MRSA, Diabetes, skin wounds, cellulitis, but joined the group session. Patient participated in a discussion on using mindfulness-based stress response to assist patient in lowering his stress response. Patient was encouraged to use mindfulness skills as a coping skill.      Assessment and Plan: Counselor will continue to meet with patient to address treatment plan goals. Patient recommendations of providers and implement skill.   Follow Up Instructions: I discussed the assessment and treatment plan with the patient. The patient was provided an opportunity to ask questions and all were answered. The patient agreed with the plan and demonstrated an understanding of the instructions.   The patient was advised to call back or seek an in-person evaluation if the symptoms worsen or if the condition fails to improve as anticipated.  I provided 120 minutes of non-face-to-face time during this encounter.   Shivani Barrantes S, LCAS

## 2019-12-08 NOTE — Plan of Care (Signed)
°  RD consulted for nutrition education regarding diabetes.   Lab Results  Component Value Date   HGBA1C 10.3 (H) 12/04/2019    RD provided "Carbohydrate Counting for People with Diabetes" handout from the Academy of Nutrition and Dietetics. Discussed different food groups and their effects on blood sugar, emphasizing carbohydrate-containing foods. Provided list of carbohydrates and recommended serving sizes of common foods.  Discussed importance of controlled and consistent carbohydrate intake throughout the day. Provided examples of ways to balance meals/snacks and encouraged intake of high-fiber, whole grain complex carbohydrates. Teach back method used.  Pt endorses being diagnosed with DM 25 years ago. States he was taking lantus and novolog at home but had some confusion on how to much to administer at meal times. Diabetes coordination dicussed adjustments and pt very thankful. Pt very aware which foods contain carbohydrates, how to count carbohydrates, and how to make meals balanced. Typically consumes three meals daily and drinks water/diet soda. Briefly reviewed handout.   Expect great compliance.  Labs and medications reviewed. No further nutrition interventions warranted at this time. RD contact information provided. If additional nutrition issues arise, please re-consult RD.  Vanessa Kick RD, LDN Clinical Nutrition Pager listed in AMION

## 2019-12-08 NOTE — Progress Notes (Signed)
Attempted to call report to receiving unit 2W. Room was not clean and the nurse stated they would call this nurse back when ready. Number was left with secretary.   -Estella Husk, RN

## 2019-12-08 NOTE — Progress Notes (Signed)
PROGRESS NOTE    Mark [name]  DTO:[id number] DOB: Dec 19, 1960 DOA: 12/04/2019 PCP: [name]   Brief Narrative: Mark [name] is a 59 y.o. male with medical history significant for insulin-dependent type 2 diabetes, hypertension, hyperlipidemia, right AKA, depression, anxiety, PTSD. Patient presented secondary to dyspnea and skin wounds and found to have DKA. Insulin drip started. Also found to have cellulitis and Ceftriaxone started.   Assessment & Plan:   Principal Problem:   Diabetic ketoacidosis associated with type 2 diabetes mellitus (HCC) Active Problems:   AKI (acute kidney injury) (HCC)   Depression with anxiety   Hypertension associated with diabetes (HCC)   Hyperlipidemia associated with type 2 diabetes mellitus (HCC)   Cellulitis of left lower extremity   Cellulitis of lower back   Pressure injury of skin   Diabetic ketoacidosis Diabetes mellitus, type 2 Patient presented in DKA with venous pH of 7.1 and bicarbonate of 5.8, anion gap of 25 with a CO2 of 13. Secondary to patient not taking prescribed insulin which was secondary to poor oral intake. Patient started on insulin IV. Acidosis resolved and anion gap closed. -Lantus 30 units daily, Novolog 10 units TID with meals. Discharge with [facility]. Can -Continue SSI  Cellulitis Located on right back and left upper lateral thigh with purulent drainage noted. Ultrasound of back significant for no abscess. CT lumbar spine without abscess. [name] of left thigh also without abscess/fluid collection. Vancomycin discontinued secondary to IV access issues. -Continue Doxycycline and Ceftriaxone -ID consult for assistance with antibiotic regimen/duration in setting of recurrent disease  MRSA bacteruria Asymptomatic. Treatment above.  AKI Baseline creatinine of 1 from 2019. Creatinine of 2.29 on admission. Secondary to dehydration from poor oral intake and hyperglycemia. Given IV fluids on admission and  during administration of IV insulin. Back towards baseline.  Essential hypertension Patient is on Coreg, lisinopril, prazosin  Depression/anxiety -Continue Buspar, Wellbutrin  Hyperlipidemia -Continue Lipitor  Pressure injury Sacrum, present on admission  Obesity Body mass index is 34.54 kg/m.   DVT prophylaxis: Heparin subq Code Status:   Code Status: Full Code Family Communication: None at bedside Disposition Plan: Discharge home likely in 24 hours pending ID recommendations and transition to oral regimen vs other outpatient regimen   Consultants:   Infectious disease  Procedures:   None  Antimicrobials:  Ceftriaxone  Vancomycin   Subjective: Pain is still present but more manageable. Still with drainage of left thigh cellulitis.  Objective: Vitals:   12/07/19 2226 12/07/19 2354 12/08/19 0411 12/08/19 0826  BP: 130/67 134/73 (!) 147/75 124/75  Pulse: 92 87 88 95  Resp: 18 20 16 18   Temp: 97.8 F (36.6 C) 98 F (36.7 C) 97.8 F (36.6 C) 98 F (36.7 C)  TempSrc: Oral Oral Axillary Oral  SpO2: 98% 99% 100% 100%  Weight:      Height:        Intake/Output Summary (Last 24 hours) at 12/08/2019 1101 Last data filed at 12/08/2019 0415 Gross per 24 hour  Intake 978.47 ml  Output 350 ml  Net 628.47 ml   Filed Weights   12/04/19 1453  Weight: 122 kg    Examination:  General exam: Appears calm and comfortable Respiratory system: Clear to auscultation. Respiratory effort normal. Cardiovascular system: S1 & S2 heard, RRR. No murmurs, rubs, gallops or clicks. Gastrointestinal system: Abdomen is nondistended, soft and nontender. No organomegaly or masses felt. Normal bowel sounds heard. Central nervous system: Alert and oriented. No focal neurological deficits. Musculoskeletal: No edema.  No calf tenderness. Right AKA Skin: No cyanosis. Right flank with significant induration and tenderness and overlying erythema. Left thigh with purulent drainage,  underlying induration and mild tenderness Psychiatry: Judgement and insight appear normal. Mood & affect appropriate.     Data Reviewed: I have personally reviewed following labs and imaging studies  CBC Lab Results  Component Value Date   WBC 12.0 (H) 12/08/2019   RBC 4.09 (L) 12/08/2019   HGB 10.7 (L) 12/08/2019   HCT 33.6 (L) 12/08/2019   MCV 82.2 12/08/2019   MCH 26.2 12/08/2019   PLT 215 12/08/2019   MCHC 31.8 12/08/2019   RDW 15.0 12/08/2019   LYMPHSABS 0.7 12/04/2019   MONOABS 0.9 12/04/2019   EOSABS 0.0 12/04/2019   BASOSABS 0.0 12/04/2019     Last metabolic panel Lab Results  Component Value Date   NA 136 12/08/2019   K 3.3 (L) 12/08/2019   CL 104 12/08/2019   CO2 23 12/08/2019   BUN 16 12/08/2019   CREATININE 0.99 12/08/2019   GLUCOSE 141 (H) 12/08/2019   GFRNONAA >60 12/08/2019   GFRAA >60 12/26/2017   CALCIUM 9.2 12/08/2019   PROT 8.5 (H) 12/04/2019   ALBUMIN 3.4 (L) 12/04/2019   BILITOT 1.8 (H) 12/04/2019   ALKPHOS 125 12/04/2019   AST 8 (L) 12/04/2019   ALT 10 12/04/2019   ANIONGAP 9 12/08/2019    CBG (last 3)  Recent Labs    12/07/19 1720 12/07/19 2143 12/08/19 0627  GLUCAP 147* 104* 170*     GFR: Estimated Creatinine Clearance: 111.5 mL/min (by C-G formula based on SCr of 0.99 mg/dL).  Coagulation Profile: Recent Labs  Lab 12/04/19 1540  INR 1.1    Recent Results (from the past 240 hour(s))  Respiratory Panel by RT PCR (Flu A&B, Covid) - Nasopharyngeal Swab     Status: None   Collection Time: 12/04/19  3:24 PM   Specimen: Nasopharyngeal Swab  Result Value Ref Range Status   SARS Coronavirus 2 by RT PCR NEGATIVE NEGATIVE Final    Comment: (NOTE) SARS-CoV-2 target nucleic acids are NOT DETECTED.  The SARS-CoV-2 RNA is generally detectable in upper respiratoy specimens during the acute phase of infection. The lowest concentration of SARS-CoV-2 viral copies this assay can detect is 131 copies/mL. A negative result does not  preclude SARS-Cov-2 infection and should not be used as the sole basis for treatment or other patient management decisions. A negative result may occur with  improper specimen collection/handling, submission of specimen other than nasopharyngeal swab, presence of viral mutation(s) within the areas targeted by this assay, and inadequate number of viral copies (<131 copies/mL). A negative result must be combined with clinical observations, patient history, and epidemiological information. The expected result is Negative.  Fact Sheet for Patients:  https://www.moore.com/  Fact Sheet for Healthcare Providers:  https://www.young.biz/  This test is no t yet approved or cleared by the [place] FDA and  has been authorized for detection and/or diagnosis of SARS-CoV-2 by FDA under an Emergency Use Authorization (EUA). This EUA will remain  in effect (meaning this test can be used) for the duration of the COVID-19 declaration under Section 564(b)(1) of the Act, 21 U.S.C. section 360bbb-3(b)(1), unless the authorization is terminated or revoked sooner.     Influenza A by PCR NEGATIVE NEGATIVE Final   Influenza B by PCR NEGATIVE NEGATIVE Final    Comment: (NOTE) The Xpert Xpress SARS-CoV-2/FLU/RSV assay is intended as an aid in  the diagnosis of influenza from Nasopharyngeal  swab specimens and  should not be used as a sole basis for treatment. Nasal washings and  aspirates are unacceptable for Xpert Xpress SARS-CoV-2/FLU/RSV  testing.  Fact Sheet for Patients: https://www.moore.com/https://www.fda.gov/media/142436/download  Fact Sheet for Healthcare Providers: https://www.young.biz/https://www.fda.gov/media/142435/download  This test is not yet approved or cleared by the [place] FDA and  has been authorized for detection and/or diagnosis of SARS-CoV-2 by  FDA under an Emergency Use Authorization (EUA). This EUA will remain  in effect (meaning this test can be used) for the  duration of the  Covid-19 declaration under Section 564(b)(1) of the Act, 21  U.S.C. section 360bbb-3(b)(1), unless the authorization is  terminated or revoked. Performed at [facility], [address] [place], [place] [address]   Culture, blood (Routine x 2)     Status: None (Preliminary result)   Collection Time: 12/04/19  3:40 PM   Specimen: BLOOD LEFT WRIST  Result Value Ref Range Status   Specimen Description BLOOD LEFT WRIST  Final   Special Requests   Final    BOTTLES DRAWN AEROBIC ONLY Blood Culture results may not be optimal due to an inadequate volume of blood received in culture bottles   Culture   Final    NO GROWTH 3 DAYS Performed at [facility], [address] [place], [place] [address]    Report Status PENDING  Incomplete  Culture, blood (Routine x 2)     Status: None (Preliminary result)   Collection Time: 12/04/19  3:44 PM   Specimen: BLOOD LEFT HAND  Result Value Ref Range Status   Specimen Description BLOOD LEFT HAND  Final   Special Requests   Final    BOTTLES DRAWN AEROBIC AND ANAEROBIC Blood Culture adequate volume   Culture   Final    NO GROWTH 3 DAYS Performed at [facility], [address] [place], [place] [address]    Report Status PENDING  Incomplete  Urine culture     Status: Abnormal   Collection Time: 12/04/19  4:01 PM   Specimen: In/Out Cath Urine  Result Value Ref Range Status   Specimen Description IN/OUT CATH URINE  Final   Special Requests   Final    NONE Performed at [facility], [address] [place], [place] [address]    Culture (A)  Final    2,000 COLONIES/mL METHICILLIN RESISTANT STAPHYLOCOCCUS AUREUS   Report Status 12/06/2019 FINAL  Final   Organism ID, Bacteria METHICILLIN RESISTANT STAPHYLOCOCCUS AUREUS (A)  Final      Susceptibility   Methicillin resistant staphylococcus aureus - MIC*    CIPROFLOXACIN >=8 RESISTANT Resistant     GENTAMICIN <=0.5 SENSITIVE Sensitive     NITROFURANTOIN <=16  SENSITIVE Sensitive     OXACILLIN >=4 RESISTANT Resistant     TETRACYCLINE <=1 SENSITIVE Sensitive     VANCOMYCIN 1 SENSITIVE Sensitive     TRIMETH/SULFA <=10 SENSITIVE Sensitive     CLINDAMYCIN <=0.25 SENSITIVE Sensitive     RIFAMPIN <=0.5 SENSITIVE Sensitive     Inducible Clindamycin NEGATIVE Sensitive     * 2,000 COLONIES/mL METHICILLIN RESISTANT STAPHYLOCOCCUS AUREUS        Radiology Studies: CT LUMBAR SPINE W CONTRAST  Result Date: 12/06/2019 CLINICAL DATA:  Low back pain. Regional cellulitis. Question back infection. EXAM: CT LUMBAR SPINE WITH  CONTRAST TECHNIQUE: Multidetector CT imaging of the lumbar spine was performed without and with intravenous contrast administration. Multiplanar CT image reconstructions were also generated. CONTRAST:  75mL OMNIPAQUE  IOHEXOL 300 MG/ML  SOLN COMPARISON:  None. FINDINGS: Segmentation: 5 lumbar type vertebral bodies. Alignment: Normal Vertebrae: No fracture or primary bone finding. No finding to suggest regional infection of the spine. Paraspinal and other soft tissues: Inflammatory changes of the posterior soft tissues including the subcutaneous fat, left more than right. No sign of deep space extension. Disc levels: No abnormality from T11-12 through L1-2. L2-3: Mild bulging of the disc. Bilateral facet osteoarthritis. Mild canal narrowing but without visible neural compression. L3-4: Normal interspace. L4-5: Normal interspace. L5-S1: Mild facet hypertrophy.  No stenosis. Solid bridging osteophytes of the right SI joint. IMPRESSION: 1. Inflammatory changes of the posterior soft tissues including the subcutaneous fat, left more than right. No sign of deep space extension. No evidence of lumbar region fracture or bone destruction. No sign of discitis. 2. 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 This could contribute to back pain. 3. Solid bridging osteophytes of the right SI joint. Electronically Signed   By: Paulina Fusi M.D.   On:  12/06/2019 12:34   [name] LT LOWER EXTREM LTD SOFT TISSUE NON VASCULAR  Result Date: 12/07/2019 CLINICAL DATA:  Redness and swelling left thigh region for approximately 1 week EXAM: ULTRASOUND LEFT POSTEROLATERAL THIGH TECHNIQUE: Longitudinal and transverse images of the posterolateral left thigh region obtained. COMPARISON:  None. FINDINGS: Soft tissue swelling is noted in the posterolateral left thigh region. No fluid collection or mass. Evident abscess. No adenopathy. IMPRESSION: Soft tissue swelling in the posterolateral left thigh without abnormal fluid, mass, or abscess evident. No focal lesion appreciable in this area by ultrasound. Electronically Signed   By: Bretta Bang III M.D.   On: 12/07/2019 10:11        Scheduled Meds: . atorvastatin  80 mg Oral Q2000  . buPROPion  300 mg Oral Daily  . busPIRone  15 mg Oral TID  . carvedilol  12.5 mg Oral BID WC  . Chlorhexidine Gluconate Cloth  6 each Topical Q0600  . doxycycline  100 mg Oral Q12H  . heparin  5,000 Units Subcutaneous Q8H  . insulin aspart  0-15 Units Subcutaneous TID WC  . insulin aspart  0-5 Units Subcutaneous QHS  . insulin aspart  10 Units Subcutaneous TID WC  . insulin glargine  30 Units Subcutaneous Daily  . prazosin  2 mg Oral QHS   Continuous Infusions: . cefTRIAXone (ROCEPHIN)  IV Stopped (12/07/19 1903)     LOS: 4 days     Jacquelin Hawking, MD [facility] 12/08/2019, 11:01 AM  If 7PM-7AM, please contact night-coverage [email or web address]

## 2019-12-08 NOTE — Progress Notes (Signed)
Pharmacy Antibiotic Note  Mark [name] is a 59 y.o. male admitted on 12/04/2019 with cellulitis.  Pharmacy has been consulted for vancomycin dosing.  ID consulted on patient and is recommending continued IV abx for now given the severity of the cellulitis. Will need PICC placement since his access is so limited, will schedule the doses of the IV abx to start later tonight to give the team time to get the line in.   Plan: Vancomycin 1250mg  IV q12h, Goal trough 10-56mcg/mL Cefepime 2g IV q8h  Metronidazole 500mg  PO q8h  Monitor C&S, renal function, VT as needed  Height: 6\' 2"  (188 cm) Weight: 122 kg (269 lb) IBW/kg (Calculated) : 82.2  Temp (24hrs), Avg:98 F (36.7 C), Min:97.8 F (36.6 C), Max:98.3 F (36.8 C)  Recent Labs  Lab 12/04/19 1540 12/04/19 1715 12/04/19 2046 12/05/19 0226 12/05/19 0726 12/06/19 0152 12/06/19 0910 12/07/19 0235 12/07/19 0916 12/08/19 0145  WBC 19.2*  --   --  14.3*  --   --  14.0*  --  13.2* 12.0*  CREATININE 2.29*  --    < > 1.57*  1.54* 1.27* 1.14  --  0.94  --  0.99  LATICACIDVEN 2.1* 2.2*  --   --   --   --   --   --   --   --    < > = values in this interval not displayed.    Estimated Creatinine Clearance: 111.5 mL/min (by C-G formula based on SCr of 0.99 mg/dL).    Allergies  Allergen Reactions  . Ozempic (0.25 Or 0.5 Mg-Dose) [Semaglutide(0.25 Or 0.5mg -Dos)] Other (See Comments)    Caused Pancreatitis  . Semaglutide Other (See Comments)    Caused pancreatitis  . Lyrica [Pregabalin] Rash    Antimicrobials this admission: Vanc 11/12 x1; 11/14> CTX 11/12>>11/15 Cefepime 11/16>> Metronidazole 11/16>>   Dose adjustments this admission: N/A  Microbiology results: 11/12 Ucx>2k MRSA  11/12 Bcx> ngtd  Thank you for allowing pharmacy to be a part of this patient's care.  12-30-1986, PharmD Infectious Disease Pharmacist  Phone: [phone] 12/08/2019 11:49 AM  Please check [email or web address] for unit specific pharmacy phone  numbers.

## 2019-12-09 ENCOUNTER — Other Ambulatory Visit: Payer: Self-pay

## 2019-12-09 DIAGNOSIS — B9562 Methicillin resistant Staphylococcus aureus infection as the cause of diseases classified elsewhere: Secondary | ICD-10-CM | POA: Diagnosis not present

## 2019-12-09 DIAGNOSIS — L03312 Cellulitis of back [any part except buttock]: Secondary | ICD-10-CM | POA: Diagnosis not present

## 2019-12-09 DIAGNOSIS — L0591 Pilonidal cyst without abscess: Secondary | ICD-10-CM | POA: Diagnosis not present

## 2019-12-09 DIAGNOSIS — N179 Acute kidney failure, unspecified: Secondary | ICD-10-CM | POA: Diagnosis not present

## 2019-12-09 DIAGNOSIS — L03116 Cellulitis of left lower limb: Secondary | ICD-10-CM | POA: Diagnosis not present

## 2019-12-09 DIAGNOSIS — E1110 Type 2 diabetes mellitus with ketoacidosis without coma: Secondary | ICD-10-CM | POA: Diagnosis not present

## 2019-12-09 DIAGNOSIS — F418 Other specified anxiety disorders: Secondary | ICD-10-CM | POA: Diagnosis not present

## 2019-12-09 LAB — GLUCOSE, CAPILLARY
Glucose-Capillary: 137 mg/dL — ABNORMAL HIGH (ref 70–99)
Glucose-Capillary: 161 mg/dL — ABNORMAL HIGH (ref 70–99)
Glucose-Capillary: 196 mg/dL — ABNORMAL HIGH (ref 70–99)
Glucose-Capillary: 185 mg/dL — ABNORMAL HIGH (ref 70–99)

## 2019-12-09 LAB — CBC
HCT: 33.3 % — ABNORMAL LOW (ref 39.0–52.0)
Hemoglobin: 10.5 g/dL — ABNORMAL LOW (ref 13.0–17.0)
MCH: 26.2 pg (ref 26.0–34.0)
MCHC: 31.5 g/dL (ref 30.0–36.0)
MCV: 83 fL (ref 80.0–100.0)
Platelets: 212 10*3/uL (ref 150–400)
RBC: 4.01 MIL/uL — ABNORMAL LOW (ref 4.22–5.81)
RDW: 15.2 % (ref 11.5–15.5)
WBC: 10.2 10*3/uL (ref 4.0–10.5)
nRBC: 0 % (ref 0.0–0.2)

## 2019-12-09 LAB — CULTURE, BLOOD (ROUTINE X 2)
Culture: NO GROWTH
Culture: NO GROWTH
Special Requests: ADEQUATE

## 2019-12-09 LAB — BASIC METABOLIC PANEL
Anion gap: 11 (ref 5–15)
BUN: 15 mg/dL (ref 6–20)
CO2: 21 mmol/L — ABNORMAL LOW (ref 22–32)
Calcium: 9 mg/dL (ref 8.9–10.3)
Chloride: 107 mmol/L (ref 98–111)
Creatinine, Ser: 0.9 mg/dL (ref 0.61–1.24)
GFR, Estimated: >60 mL/min (ref >60)
Glucose, Bld: 122 mg/dL — ABNORMAL HIGH (ref 70–99)
Potassium: 3.8 mmol/L (ref 3.5–5.1)
Sodium: 139 mmol/L (ref 135–145)

## 2019-12-09 LAB — PROCALCITONIN: Procalcitonin: 0.18 ng/mL

## 2019-12-09 LAB — MRSA PCR SCREENING: MRSA by PCR: POSITIVE — AB

## 2019-12-09 MED ORDER — LISINOPRIL 20 MG PO TABS
20.0000 mg | ORAL_TABLET | Freq: Every day | ORAL | Status: DC
  Administered 2019-12-09 – 2019-12-22 (×14): 20 mg via ORAL
  Filled 2019-12-09 (×16): qty 1

## 2019-12-09 MED ORDER — HYDROMORPHONE HCL 1 MG/ML IJ SOLN
1.0000 mg | INTRAMUSCULAR | Status: DC | PRN
  Administered 2019-12-09 – 2019-12-13 (×14): 1 mg via INTRAVENOUS
  Filled 2019-12-09 (×14): qty 1

## 2019-12-09 MED ORDER — DOCUSATE SODIUM 100 MG PO CAPS
100.0000 mg | ORAL_CAPSULE | Freq: Two times a day (BID) | ORAL | Status: DC
  Filled 2019-12-09 (×3): qty 1

## 2019-12-09 NOTE — Consult Note (Addendum)
   [facility] CM Inpatient Consult   12/09/2019  Mark [name] 01-25-1960 [id number]   [place] [facility] [ACO] Patient: [facility] Referral:  Diabetic Coordinator  Thank you for your referral request for Diabetes Management with Hemoglobin A1C 10.3.  Reviewed request.  Patient showing as pending with [facility] Care Management status and reviewed [place] Coordinator's note for referral.  Met with the patient at the bedside and explained.  [facility] Care Management services for post hospital follow up request for diabetes.  Patient states he wouldn't mind any follow up but endorses his only primary care provider is currently the [place] in [place].  He states he is under their community care as well but "I welcome any help I can get from my [employer] benefit."  Explained that the referral was for his A1C being elevated.  Patient states, "I understand it's high and that's because I have an infection that drives my sugar up. I am trying to do the Mediterranean diet but has not be able to cook for myself on crutches with my chair being broken."  Plan:  Patient is agreeable to post hospital follow up.  Will update the assigned [facility] RNCM of ongoing needs for transition of care.  Patient was given a brochure and a 24 hour nurse advise line magnet.  Of note, [facility] Care Management does not interfere with or replace any services needed or arranged by the inpatient Transition of Care [TOC] team.  Natividad Brood, RN BSN Secretary Hospital Liaison  [phone] business mobile phone Toll free office [phone]  Fax number: [phone] [email or web address]

## 2019-12-09 NOTE — Patient Outreach (Signed)
[facility]) Care Management  12/09/2019  Mark [name] December 13, 1960 [id number]     Transition of Care Referral  Referral Date: 12/10/2019 Referral Source: Inpatient DM RN Referral Reason: "DM, A1C 10.3" Date of Discharge: pending Facility: [facility] Insurance: [facility]    Referral received. Patient remains inpatient at this time.    Plan: RN CM will continue to follow and monitor for discharge plans/disposition.    Antionette Fairy, RN,BSN,CCM [facility] Care Management Telephonic Care Management Coordinator Direct Phone: [phone] Toll Free: [phone] Fax: [phone]

## 2019-12-09 NOTE — Progress Notes (Signed)
[facility] Infectious Diseases Follow Up Note  Patient Identification: Patient Name: Mark [name] MRN: [id number] Admit Date: 12/04/2019  2:43 PM Age: 59 y.o.Today's Date: 12/09/2019   Reason for Visit: Follow up on cellulitis   Principal Problem:   Diabetic ketoacidosis associated with type 2 diabetes mellitus (HCC) Active Problems:   AKI (acute kidney injury) (HCC)   Depression with anxiety   Hypertension associated with diabetes (HCC)   Hyperlipidemia associated with type 2 diabetes mellitus (HCC)   Cellulitis of left lower extremity   Cellulitis of lower back   Pressure injury of skin   Antibiotics: Vanc/cefepime and Metronidazole Day 3   Assessment 1. Multiple sites of cellulitis ( Left Lower back, Rt Lower back and Left Posterior thigh) - Has significant erythema/tenderness and induration present in all of these sites and would recommend to continue IV abx - No abscess noted in CT scan  ' 2. Healing Pilonidal Cyst in the Gluteal Fold 3. MRSA in Urine cx - no urinary symptoms. UA not concerning for UTI 4. Poorly controlled DM  5. DKA resolved  Recommendations  No significant changes in clinical exam today Continue same coverage of antibiotics Will re-assess tomorrow Possibly need a midline for IV abx given hardstick Follow up medical records from [facility] of the management as per the primary team. Please call us with questions.  Thank you for the consult.  Rest of the management as per the primary team. Thank you for the consult. Please page with pertinent questions or concerns.  Odette Fraction, MD Infectious Diseases  [facility]   To contact the attending provider between 8A-5P or the covering provider during after hours 5P-8A, please log into the web site [email or web address] and access using universal Post password for that web site. If you do not have the password,  please call the hospital operator. ______________________________________________________________________ Subjective patient seen and examined at the bedside. Denies any fever, chills. Continues to have same intensity of pain and tenderness in the cellulitic area in the left lower back, rt lower back and left posterior thigh   Vitals BP (!) 137/57   Pulse 79   Temp 98.2 F (36.8 C)   Resp 18   Ht 6\' 2"  (1.88 m)   Wt 122 kg   SpO2 100%   BMI 34.54 kg/m    Constitutional: NAD Comments:   Cardiovascular:  Rate and Rhythm: Normal rateand regular rhythm.  Heart sounds:  Pulmonary:  Effort: Pulmonary effort is normal.  Comments: Clear air bilaterally  Abdominal:  Palpations: Abdomen is soft. BS+ Tenderness: Non tender  Musculoskeletal:  General: No swellingor tenderness. Rt AKA  Skin: Comments: 2 AREAS OF CELLULITIS IN THE RT AND LEFT LATERAL BACK WITH SIGNIFICANT ERYTHEMA AND INDURATION. THE AREA IN THE LEFT IS OPEN WITH MINIMAL DRAINAGE.  LEFT POSTERIOR THIGH ERYTHEMA AND TENDERNESS   Neurological:  General: No focal deficitpresent.   Psychiatric:  Mood and Affect: Moodnormal.    LINES/TUBES: PIV   METAL IMPLANT/HARDWARE:   Pertinent Microbiology Results for orders placed or performed during the hospital encounter of 12/04/19  Respiratory Panel by RT PCR (Flu A&B, Covid) - Nasopharyngeal Swab     Status: None   Collection Time: 12/04/19  3:24 PM   Specimen: Nasopharyngeal Swab  Result Value Ref Range Status   SARS Coronavirus 2 by RT PCR NEGATIVE NEGATIVE Final    Comment: (NOTE) SARS-CoV-2 target nucleic acids are NOT DETECTED.  The SARS-CoV-2 RNA is generally detectable  in upper respiratoy specimens during the acute phase of infection. The lowest concentration of SARS-CoV-2 viral copies this assay can detect is 131 copies/mL. A negative result does not preclude SARS-Cov-2 infection and should not be used  as the sole basis for treatment or other patient management decisions. A negative result may occur with  improper specimen collection/handling, submission of specimen other than nasopharyngeal swab, presence of viral mutation(s) within the areas targeted by this assay, and inadequate number of viral copies (<131 copies/mL). A negative result must be combined with clinical observations, patient history, and epidemiological information. The expected result is Negative.  Fact Sheet for Patients:  https://www.moore.com/  Fact Sheet for Healthcare Providers:  https://www.young.biz/  This test is no t yet approved or cleared by the [place] FDA and  has been authorized for detection and/or diagnosis of SARS-CoV-2 by FDA under an Emergency Use Authorization (EUA). This EUA will remain  in effect (meaning this test can be used) for the duration of the COVID-19 declaration under Section 564(b)(1) of the Act, 21 U.S.C. section 360bbb-3(b)(1), unless the authorization is terminated or revoked sooner.     Influenza A by PCR NEGATIVE NEGATIVE Final   Influenza B by PCR NEGATIVE NEGATIVE Final    Comment: (NOTE) The Xpert Xpress SARS-CoV-2/FLU/RSV assay is intended as an aid in  the diagnosis of influenza from Nasopharyngeal swab specimens and  should not be used as a sole basis for treatment. Nasal washings and  aspirates are unacceptable for Xpert Xpress SARS-CoV-2/FLU/RSV  testing.  Fact Sheet for Patients: https://www.moore.com/  Fact Sheet for Healthcare Providers: https://www.young.biz/  This test is not yet approved or cleared by the [place] FDA and  has been authorized for detection and/or diagnosis of SARS-CoV-2 by  FDA under an Emergency Use Authorization (EUA). This EUA will remain  in effect (meaning this test can be used) for the duration of the  Covid-19 declaration under Section  564(b)(1) of the Act, 21  U.S.C. section 360bbb-3(b)(1), unless the authorization is  terminated or revoked. Performed at [facility], [address], [place], [place] [address]   Culture, blood (Routine x 2)     Status: None   Collection Time: 12/04/19  3:40 PM   Specimen: BLOOD LEFT WRIST  Result Value Ref Range Status   Specimen Description BLOOD LEFT WRIST  Final   Special Requests   Final    BOTTLES DRAWN AEROBIC ONLY Blood Culture results may not be optimal due to an inadequate volume of blood received in culture bottles   Culture   Final    NO GROWTH 5 DAYS Performed at [facility], [address], [place], [place] [address]    Report Status 12/09/2019 FINAL  Final  Culture, blood (Routine x 2)     Status: None   Collection Time: 12/04/19  3:44 PM   Specimen: BLOOD LEFT HAND  Result Value Ref Range Status   Specimen Description BLOOD LEFT HAND  Final   Special Requests   Final    BOTTLES DRAWN AEROBIC AND ANAEROBIC Blood Culture adequate volume   Culture   Final    NO GROWTH 5 DAYS Performed at [facility], [address], [place], [place] [address]    Report Status 12/09/2019 FINAL  Final  Urine culture     Status: Abnormal   Collection Time: 12/04/19  4:01 PM   Specimen: In/Out Cath Urine  Result Value Ref Range Status   Specimen Description IN/OUT  CATH URINE  Final   Special Requests   Final    NONE Performed at [facility], [address] [place], [place] [address]    Culture (A)  Final    2,000 COLONIES/mL METHICILLIN RESISTANT STAPHYLOCOCCUS AUREUS   Report Status 12/06/2019 FINAL  Final   Organism ID, Bacteria METHICILLIN RESISTANT STAPHYLOCOCCUS AUREUS (A)  Final      Susceptibility   Methicillin resistant staphylococcus aureus - MIC*    CIPROFLOXACIN >=8 RESISTANT Resistant     GENTAMICIN <=0.5 SENSITIVE Sensitive     NITROFURANTOIN <=16 SENSITIVE Sensitive     OXACILLIN >=4 RESISTANT Resistant     TETRACYCLINE <=1  SENSITIVE Sensitive     VANCOMYCIN 1 SENSITIVE Sensitive     TRIMETH/SULFA <=10 SENSITIVE Sensitive     CLINDAMYCIN <=0.25 SENSITIVE Sensitive     RIFAMPIN <=0.5 SENSITIVE Sensitive     Inducible Clindamycin NEGATIVE Sensitive     * 2,000 COLONIES/mL METHICILLIN RESISTANT STAPHYLOCOCCUS AUREUS  MRSA PCR Screening     Status: Abnormal   Collection Time: 12/09/19 12:19 PM   Specimen: Nasal Mucosa; Nasopharyngeal  Result Value Ref Range Status   MRSA by PCR POSITIVE (A) NEGATIVE Final    Comment:        The GeneXpert MRSA Assay (FDA approved for NASAL specimens only), is one component of a comprehensive MRSA colonization surveillance program. It is not intended to diagnose MRSA infection nor to guide or monitor treatment for MRSA infections. CRITICAL RESULT CALLED TO, READ BACK BY AND VERIFIED WITH: RN [facility] COOPER AT 1416 12/09/19 BY MM. Performed at [facility], [address], [place], [place] [address]     Pertinent Lab. CBC Latest Ref Rng & Units 12/09/2019 12/08/2019 12/07/2019  WBC 4.0 - 10.5 K/uL 10.2 12.0(H) 13.2(H)  Hemoglobin 13.0 - 17.0 g/dL 10.5(L) 10.7(L) 10.4(L)  Hematocrit 39 - 52 % 33.3(L) 33.6(L) 32.4(L)  Platelets 150 - 400 K/uL 212 215 186   CMP Latest Ref Rng & Units 12/09/2019 12/08/2019 12/07/2019  Glucose 70 - 99 mg/dL 932(I) 712(W) 580(D)  BUN 6 - 20 mg/dL 15 16 16   Creatinine 0.61 - 1.24 mg/dL 9.83 3.82  Sodium 135 - 145 mmol/L 139 136 135  Potassium 3.5 - 5.1 mmol/L 3.8 3.3(L) 3.3(L)  Chloride 98 - 111 mmol/L 107 104 103  CO2 22 - 32 mmol/L 21(L) 23 24  Calcium 8.9 - 10.3 mg/dL 9.0 9.2 9.1  Total Protein 6.5 - 8.1 g/dL - - -  Total Bilirubin 0.3 - 1.2 mg/dL - - -  Alkaline Phos 38 - 126 U/L - - -  AST 15 - 41 U/L - - -  ALT 0 - 44 U/L - - -    Pertinent Imaging today Plain films and CT images have been personally visualized and interpreted; radiology reports have been reviewed. Decision making incorporated into the Impression /  Recommendations.

## 2019-12-09 NOTE — Progress Notes (Signed)
Progress Note    Mark [name]  VOZ:[id number] DOB: 12-26-1960  DOA: 12/04/2019 PCP: [name]    Brief Narrative:     Medical records reviewed and are as summarized below:  Rice Walsh is an 59 y.o. male with medical history significant forinsulin-dependent type 2 diabetes, hypertension, hyperlipidemia, right AKA, depression, anxiety, PTSD. Patient presented with DKA Also found to have cellulitis.  Assessment/Plan:   Principal Problem:   Diabetic ketoacidosis associated with type 2 diabetes mellitus (HCC) Active Problems:   AKI (acute kidney injury) (HCC)   Depression with anxiety   Hypertension associated with diabetes (HCC)   Hyperlipidemia associated with type 2 diabetes mellitus (HCC)   Cellulitis of left lower extremity   Cellulitis of lower back   Pressure injury of skin    DKA/DM type 2  -DKA resolved with insulin gtt -on lantus plus novolog  cellulitis -U/S shows no abscess -CT scan d/o abscess -IV abx per ID -I have re-requested [facility] with more specific requested data as not done on 11/12  Chronic/acute pain -PRN IV and PO  AKI -resolved  HTN -resume home meds  Depression/anxiety -buspar/wellbutrin  Pressure Injury 12/04/19 Sacrum Stage 2 -  Partial thickness loss of dermis presenting as a shallow open injury with a red, pink wound bed without slough. (Active)  12/04/19 2100  Location: Sacrum  Location Orientation:   Staging: Stage 2 -  Partial thickness loss of dermis presenting as a shallow open injury with a red, pink wound bed without slough.  Wound Description (Comments):   Present on Admission: Yes    obesity Body mass index is 34.54 kg/m.   Family Communication/Anticipated D/C date and plan/Code Status   DVT prophylaxis: heparin Code Status: Full Code.  Disposition Plan: Status is: Inpatient  Remains inpatient appropriate because:Inpatient level of care appropriate due to severity of  illness   Dispo: The patient is from: Home              Anticipated d/c is to: Home              Anticipated d/c date is: 2 days              Patient currently is not medically stable to d/c.         Medical Consultants:    ID     Subjective:   C/o uncontrolled pain-- says he is always at 8-10  Objective:    Vitals:   12/08/19 1538 12/08/19 2010 12/09/19 0311 12/09/19 0900  BP: (!) 141/71 (!) 155/74 (!) 157/74 (!) 151/77  Pulse: 87 84 88 (!) 101  Resp: 18 20 18    Temp: 97.8 F (36.6 C) 98.4 F (36.9 C) 98.1 F (36.7 C)   TempSrc:  Oral    SpO2: 100% 100% 100%   Weight:      Height:        Intake/Output Summary (Last 24 hours) at 12/09/2019 1027 Last data filed at 12/09/2019 0900 Gross per 24 hour  Intake 350 ml  Output 650 ml  Net -300 ml   Filed Weights   12/04/19 1453  Weight: 122 kg    Exam:  General: Appearance:    Obese male in no acute distress     Lungs:     Clear to auscultation bilaterally, respirations unlabored  Heart:    Tachycardic. Normal rhythm. No murmurs, rubs, or gallops.   MS:   Right leg amputation noted (AKA)  Neurologic:   Awake, alert, oriented x 3. No apparent focal neurological           defect.     Data Reviewed:   I have personally reviewed following labs and imaging studies:  Labs: Labs show the following:   Basic Metabolic Panel: Recent Labs  Lab 12/05/19 0726 12/05/19 0726 12/06/19 0152 12/06/19 0152 12/07/19 0235 12/07/19 0235 12/08/19 0145 12/09/19 0158  NA 135  --  133*  --  135  --  136 139  K 3.5   < > 4.0   < > 3.3*   < > 3.3* 3.8  CL 102  --  102  --  103  --  104 107  CO2 21*  --  19*  --  24  --  23 21*  GLUCOSE 204*  --  277*  --  199*  --  141* 122*  BUN 37*  --  29*  --  16  --  16 15  CREATININE 1.27*  --  1.14  --  0.94  --  0.99 0.90  CALCIUM 9.1  --  9.0  --  9.1  --  9.2 9.0   < > = values in this interval not displayed.   GFR Estimated Creatinine Clearance: 122.6 mL/min (by  C-G formula based on SCr of 0.9 mg/dL). Liver Function Tests: Recent Labs  Lab 12/04/19 1540  AST 8*  ALT 10  ALKPHOS 125  BILITOT 1.8*  PROT 8.5*  ALBUMIN 3.4*   No results for input(s): LIPASE, AMYLASE in the last 168 hours. No results for input(s): AMMONIA in the last 168 hours. Coagulation profile Recent Labs  Lab 12/04/19 1540  INR 1.1    CBC: Recent Labs  Lab 12/04/19 1540 12/04/19 1732 12/05/19 0226 12/06/19 0910 12/07/19 0916 12/08/19 0145 12/09/19 0158  WBC 19.2*   < > 14.3* 14.0* 13.2* 12.0* 10.2  NEUTROABS 17.3*  --   --   --   --   --   --   HGB 11.7*   < > 11.3* 10.5* 10.4* 10.7* 10.5*  HCT 38.2*   < > 35.0* 32.6* 32.4* 33.6* 33.3*  MCV 85.5   < > 82.0 80.7 81.6 82.2 83.0  PLT 236   < > 218 187 186 215 212   < > = values in this interval not displayed.   Cardiac Enzymes: No results for input(s): CKTOTAL, CKMB, CKMBINDEX, TROPONINI in the last 168 hours. BNP (last 3 results) No results for input(s): PROBNP in the last 8760 hours. CBG: Recent Labs  Lab 12/07/19 2143 12/08/19 0627 12/08/19 1545 12/08/19 2203 12/09/19 0726  GLUCAP 104* 170* 156* 127* 137*   D-Dimer: No results for input(s): DDIMER in the last 72 hours. Hgb A1c: No results for input(s): HGBA1C in the last 72 hours. Lipid Profile: No results for input(s): CHOL, HDL, LDLCALC, TRIG, CHOLHDL, LDLDIRECT in the last 72 hours. Thyroid function studies: No results for input(s): TSH, T4TOTAL, T3FREE, THYROIDAB in the last 72 hours.  Invalid input(s): FREET3 Anemia work up: No results for input(s): VITAMINB12, FOLATE, FERRITIN, TIBC, IRON, RETICCTPCT in the last 72 hours. Sepsis Labs: Recent Labs  Lab 12/04/19 1540 12/04/19 1715 12/05/19 0226 12/06/19 0910 12/07/19 0916 12/08/19 0145 12/09/19 0158  PROCALCITON  --   --   --   --  0.52 0.44 0.18  WBC 19.2*  --    < > 14.0* 13.2* 12.0* 10.2  LATICACIDVEN 2.1* 2.2*  --   --   --   --   --    < > =  values in this interval not  displayed.    Microbiology Recent Results (from the past 240 hour(s))  Respiratory Panel by RT PCR (Flu A&B, Covid) - Nasopharyngeal Swab     Status: None   Collection Time: 12/04/19  3:24 PM   Specimen: Nasopharyngeal Swab  Result Value Ref Range Status   SARS Coronavirus 2 by RT PCR NEGATIVE NEGATIVE Final    Comment: (NOTE) SARS-CoV-2 target nucleic acids are NOT DETECTED.  The SARS-CoV-2 RNA is generally detectable in upper respiratoy specimens during the acute phase of infection. The lowest concentration of SARS-CoV-2 viral copies this assay can detect is 131 copies/mL. A negative result does not preclude SARS-Cov-2 infection and should not be used as the sole basis for treatment or other patient management decisions. A negative result may occur with  improper specimen collection/handling, submission of specimen other than nasopharyngeal swab, presence of viral mutation(s) within the areas targeted by this assay, and inadequate number of viral copies (<131 copies/mL). A negative result must be combined with clinical observations, patient history, and epidemiological information. The expected result is Negative.  Fact Sheet for Patients:  https://www.moore.com/https://www.fda.gov/media/142436/download  Fact Sheet for Healthcare Providers:  https://www.young.biz/https://www.fda.gov/media/142435/download  This test is no t yet approved or cleared by the [place] FDA and  has been authorized for detection and/or diagnosis of SARS-CoV-2 by FDA under an Emergency Use Authorization (EUA). This EUA will remain  in effect (meaning this test can be used) for the duration of the COVID-19 declaration under Section 564(b)(1) of the Act, 21 U.S.C. section 360bbb-3(b)(1), unless the authorization is terminated or revoked sooner.     Influenza A by PCR NEGATIVE NEGATIVE Final   Influenza B by PCR NEGATIVE NEGATIVE Final    Comment: (NOTE) The Xpert Xpress SARS-CoV-2/FLU/RSV assay is intended as an aid in  the diagnosis  of influenza from Nasopharyngeal swab specimens and  should not be used as a sole basis for treatment. Nasal washings and  aspirates are unacceptable for Xpert Xpress SARS-CoV-2/FLU/RSV  testing.  Fact Sheet for Patients: https://www.moore.com/https://www.fda.gov/media/142436/download  Fact Sheet for Healthcare Providers: https://www.young.biz/https://www.fda.gov/media/142435/download  This test is not yet approved or cleared by the [place] FDA and  has been authorized for detection and/or diagnosis of SARS-CoV-2 by  FDA under an Emergency Use Authorization (EUA). This EUA will remain  in effect (meaning this test can be used) for the duration of the  Covid-19 declaration under Section 564(b)(1) of the Act, 21  U.S.C. section 360bbb-3(b)(1), unless the authorization is  terminated or revoked. Performed at [facility], [address] [place], [place] [address]   Culture, blood (Routine x 2)     Status: None (Preliminary result)   Collection Time: 12/04/19  3:40 PM   Specimen: BLOOD LEFT WRIST  Result Value Ref Range Status   Specimen Description BLOOD LEFT WRIST  Final   Special Requests   Final    BOTTLES DRAWN AEROBIC ONLY Blood Culture results may not be optimal due to an inadequate volume of blood received in culture bottles   Culture   Final    NO GROWTH 4 DAYS Performed at [facility], [address] [place], [place] [address]    Report Status PENDING  Incomplete  Culture, blood (Routine x 2)     Status: None (Preliminary result)   Collection Time: 12/04/19  3:44 PM   Specimen: BLOOD LEFT HAND  Result Value Ref Range Status   Specimen Description BLOOD LEFT HAND  Final  Special Requests   Final    BOTTLES DRAWN AEROBIC AND ANAEROBIC Blood Culture adequate volume   Culture   Final    NO GROWTH 4 DAYS Performed at [facility], [address], [place], [place] [address]    Report Status PENDING  Incomplete  Urine culture     Status: Abnormal   Collection Time: 12/04/19  4:01 PM    Specimen: In/Out Cath Urine  Result Value Ref Range Status   Specimen Description IN/OUT CATH URINE  Final   Special Requests   Final    NONE Performed at [facility], [address], [place], [place] [address]    Culture (A)  Final    2,000 COLONIES/mL METHICILLIN RESISTANT STAPHYLOCOCCUS AUREUS   Report Status 12/06/2019 FINAL  Final   Organism ID, Bacteria METHICILLIN RESISTANT STAPHYLOCOCCUS AUREUS (A)  Final      Susceptibility   Methicillin resistant staphylococcus aureus - MIC*    CIPROFLOXACIN >=8 RESISTANT Resistant     GENTAMICIN <=0.5 SENSITIVE Sensitive     NITROFURANTOIN <=16 SENSITIVE Sensitive     OXACILLIN >=4 RESISTANT Resistant     TETRACYCLINE <=1 SENSITIVE Sensitive     VANCOMYCIN 1 SENSITIVE Sensitive     TRIMETH/SULFA <=10 SENSITIVE Sensitive     CLINDAMYCIN <=0.25 SENSITIVE Sensitive     RIFAMPIN <=0.5 SENSITIVE Sensitive     Inducible Clindamycin NEGATIVE Sensitive     * 2,000 COLONIES/mL METHICILLIN RESISTANT STAPHYLOCOCCUS AUREUS    Procedures and diagnostic studies:  No results found.  Medications:   . atorvastatin  80 mg Oral Q2000  . buPROPion  300 mg Oral Daily  . busPIRone  15 mg Oral TID  . carvedilol  12.5 mg Oral BID WC  . Chlorhexidine Gluconate Cloth  6 each Topical Q0600  . heparin  5,000 Units Subcutaneous Q8H  . insulin aspart  0-15 Units Subcutaneous TID WC  . insulin aspart  0-5 Units Subcutaneous QHS  . insulin aspart  10 Units Subcutaneous TID WC  . insulin glargine  30 Units Subcutaneous Daily  . lisinopril  20 mg Oral Daily  . metroNIDAZOLE  500 mg Oral Q8H  . prazosin  2 mg Oral QHS   Continuous Infusions: . ceFEPime (MAXIPIME) IV 2 g (12/09/19 0509)  . vancomycin 1,250 mg (12/09/19 0859)     LOS: 5 days   Joseph Art  [facility]   How to contact the [facility] Attending or Consulting provider 7A - 7P or covering provider during after hours 7P -7A, for this patient?  1. Check the care team in [facility] and  look for a) attending/consulting TR[facility] provider listed and b) the [facility] team listed 2. Log into [email or web address] and use [place]'s universal password to access. If you do not have the password, please contact the hospital operator. 3. Locate the [facility] provider you are looking for under [facility] and page to a number that you can be directly reached. 4. If you still have difficulty reaching the provider, please page the [facility] (Director on Call) for the Hospitalists listed on amion for assistance.  12/09/2019, 10:27 AM

## 2019-12-10 ENCOUNTER — Encounter (HOSPITAL_COMMUNITY): Payer: Self-pay | Admitting: Licensed Clinical Social Worker

## 2019-12-10 ENCOUNTER — Encounter (HOSPITAL_COMMUNITY): Payer: Self-pay | Admitting: Internal Medicine

## 2019-12-10 ENCOUNTER — Ambulatory Visit (INDEPENDENT_AMBULATORY_CARE_PROVIDER_SITE_OTHER): Payer: No Typology Code available for payment source | Admitting: Licensed Clinical Social Worker

## 2019-12-10 DIAGNOSIS — F4310 Post-traumatic stress disorder, unspecified: Secondary | ICD-10-CM | POA: Diagnosis not present

## 2019-12-10 DIAGNOSIS — L0591 Pilonidal cyst without abscess: Secondary | ICD-10-CM | POA: Diagnosis not present

## 2019-12-10 DIAGNOSIS — L0291 Cutaneous abscess, unspecified: Secondary | ICD-10-CM

## 2019-12-10 DIAGNOSIS — F419 Anxiety disorder, unspecified: Secondary | ICD-10-CM

## 2019-12-10 DIAGNOSIS — B9562 Methicillin resistant Staphylococcus aureus infection as the cause of diseases classified elsewhere: Secondary | ICD-10-CM | POA: Diagnosis not present

## 2019-12-10 DIAGNOSIS — L03312 Cellulitis of back [any part except buttock]: Secondary | ICD-10-CM | POA: Diagnosis not present

## 2019-12-10 DIAGNOSIS — F331 Major depressive disorder, recurrent, moderate: Secondary | ICD-10-CM

## 2019-12-10 DIAGNOSIS — E1159 Type 2 diabetes mellitus with other circulatory complications: Secondary | ICD-10-CM | POA: Diagnosis not present

## 2019-12-10 DIAGNOSIS — E1110 Type 2 diabetes mellitus with ketoacidosis without coma: Secondary | ICD-10-CM | POA: Diagnosis not present

## 2019-12-10 DIAGNOSIS — L03116 Cellulitis of left lower limb: Secondary | ICD-10-CM | POA: Diagnosis not present

## 2019-12-10 DIAGNOSIS — N179 Acute kidney failure, unspecified: Secondary | ICD-10-CM | POA: Diagnosis not present

## 2019-12-10 LAB — BASIC METABOLIC PANEL
Anion gap: 11 (ref 5–15)
BUN: 15 mg/dL (ref 6–20)
CO2: 22 mmol/L (ref 22–32)
Calcium: 9 mg/dL (ref 8.9–10.3)
Chloride: 106 mmol/L (ref 98–111)
Creatinine, Ser: 1.02 mg/dL (ref 0.61–1.24)
GFR, Estimated: >60 mL/min (ref >60)
Glucose, Bld: 157 mg/dL — ABNORMAL HIGH (ref 70–99)
Potassium: 4.1 mmol/L (ref 3.5–5.1)
Sodium: 139 mmol/L (ref 135–145)

## 2019-12-10 LAB — CBC
HCT: 32.4 % — ABNORMAL LOW (ref 39.0–52.0)
Hemoglobin: 10.3 g/dL — ABNORMAL LOW (ref 13.0–17.0)
MCH: 26.1 pg (ref 26.0–34.0)
MCHC: 31.8 g/dL (ref 30.0–36.0)
MCV: 82 fL (ref 80.0–100.0)
Platelets: 205 10*3/uL (ref 150–400)
RBC: 3.95 MIL/uL — ABNORMAL LOW (ref 4.22–5.81)
RDW: 15.2 % (ref 11.5–15.5)
WBC: 11.4 10*3/uL — ABNORMAL HIGH (ref 4.0–10.5)
nRBC: 0 % (ref 0.0–0.2)

## 2019-12-10 LAB — GLUCOSE, CAPILLARY
Glucose-Capillary: 185 mg/dL — ABNORMAL HIGH (ref 70–99)
Glucose-Capillary: 246 mg/dL — ABNORMAL HIGH (ref 70–99)

## 2019-12-10 MED ORDER — INSULIN ASPART 100 UNIT/ML ~~LOC~~ SOLN
12.0000 [IU] | Freq: Three times a day (TID) | SUBCUTANEOUS | Status: DC
  Administered 2019-12-10 – 2019-12-19 (×25): 12 [IU] via SUBCUTANEOUS

## 2019-12-10 MED ORDER — DOCUSATE SODIUM 100 MG PO CAPS: 100.0000 mg | ORAL_CAPSULE | Freq: Two times a day (BID) | ORAL | Status: DC | PRN

## 2019-12-10 NOTE — Progress Notes (Addendum)
Progress Note    Arshia Rondon  OEU:[id number] DOB: July 25, 1960  DOA: 12/04/2019 PCP: [name]    Brief Narrative:     Medical records reviewed and are as summarized below:  Mark [name] is an 59 y.o. male with medical history significant forinsulin-dependent type 2 diabetes, hypertension, hyperlipidemia, right AKA, depression, anxiety, PTSD. Patient presented with DKA Also found to have cellulitis.  Slow to improve so ID consulted as well as GS for I/d in the OR.   Assessment/Plan:   Principal Problem:   Diabetic ketoacidosis associated with type 2 diabetes mellitus (HCC) Active Problems:   AKI (acute kidney injury) (HCC)   Depression with anxiety   Hypertension associated with diabetes (HCC)   Hyperlipidemia associated with type 2 diabetes mellitus (HCC)   Cellulitis of left lower extremity   Cellulitis of lower back   Pressure injury of skin    DKA/DM type 2  -DKA resolved with insulin gtt -on lantus plus novolog -blood sugars improved  cellulitis -U/S shows no abscess -CT scan w/o abscess -IV abx per ID -I have re-requested the records from the [facility] with more specific requested data as not done on 11/12  Chronic/acute pain -PRN IV and PO  AKI -resolved  HTN -resume home meds  Depression/anxiety -buspar/wellbutrin  Pressure Injury 12/04/19 Sacrum Stage 2 -  Partial thickness loss of dermis presenting as a shallow open injury with a red, pink wound bed without slough. (Active)  12/04/19 2100  Location: Sacrum  Location Orientation:   Staging: Stage 2 -  Partial thickness loss of dermis presenting as a shallow open injury with a red, pink wound bed without slough.  Wound Description (Comments):   Present on Admission: Yes    obesity Body mass index is 34.54 kg/m.   Family Communication/Anticipated D/C date and plan/Code Status   DVT prophylaxis: heparin Code Status: Full Code.  Disposition Plan: Status is:  Inpatient  Remains inpatient appropriate because:Inpatient level of care appropriate due to severity of illness   Dispo: The patient is from: Home              Anticipated d/c is to: Home              Anticipated d/c date is: 2 days              Patient currently is not medically stable to d/c.         Medical Consultants:    ID  GS     Subjective:   Pain improved today   Objective:    Vitals:   12/09/19 1954 12/10/19 0500 12/10/19 0755 12/10/19 0913  BP: (!) 146/69 133/76 (!) 122/39 135/74  Pulse: 79 92 95 (!) 106  Resp:  17 19 18   Temp: 97.6 F (36.4 C) (!) 97.1 F (36.2 C) 98.2 F (36.8 C) 98.5 F (36.9 C)  TempSrc: Axillary Oral  Oral  SpO2:  100% 100% 100%  Weight:      Height:        Intake/Output Summary (Last 24 hours) at 12/10/2019 1101 Last data filed at 12/10/2019 0900 Gross per 24 hour  Intake 240 ml  Output 1440 ml  Net -1200 ml   Filed Weights   12/04/19 1453  Weight: 122 kg    Exam:   General: Appearance:    Obese male in no acute distress     Lungs:     respirations unlabored  Heart:    Tachycardic. Normal rhythm.  No murmurs, rubs, or gallops.   MS:   Right AKA  Neurologic:   Awake, alert, oriented x 3. No apparent focal neurological           defect.     Data Reviewed:   I have personally reviewed following labs and imaging studies:  Labs: Labs show the following:   Basic Metabolic Panel: Recent Labs  Lab 12/06/19 0152 12/06/19 0152 12/07/19 0235 12/07/19 0235 12/08/19 0145 12/08/19 0145 12/09/19 0158 12/10/19 0131  NA 133*  --  135  --  136  --  139 139  K 4.0   < > 3.3*   < > 3.3*   < > 3.8 4.1  CL 102  --  103  --  104  --  107 106  CO2 19*  --  24  --  23  --  21* 22  GLUCOSE 277*  --  199*  --  141*  --  122* 157*  BUN 29*  --  16  --  16  --  15 15  CREATININE 1.14  --  0.94  --  0.99  --  0.90 1.02  CALCIUM 9.0  --  9.1  --  9.2  --  9.0 9.0   < > = values in this interval not displayed.    GFR Estimated Creatinine Clearance: 108.2 mL/min (by C-G formula based on SCr of 1.02 mg/dL). Liver Function Tests: Recent Labs  Lab 12/04/19 1540  AST 8*  ALT 10  ALKPHOS 125  BILITOT 1.8*  PROT 8.5*  ALBUMIN 3.4*   No results for input(s): LIPASE, AMYLASE in the last 168 hours. No results for input(s): AMMONIA in the last 168 hours. Coagulation profile Recent Labs  Lab 12/04/19 1540  INR 1.1    CBC: Recent Labs  Lab 12/04/19 1540 12/04/19 1732 12/06/19 0910 12/07/19 0916 12/08/19 0145 12/09/19 0158 12/10/19 0131  WBC 19.2*   < > 14.0* 13.2* 12.0* 10.2 11.4*  NEUTROABS 17.3*  --   --   --   --   --   --   HGB 11.7*   < > 10.5* 10.4* 10.7* 10.5* 10.3*  HCT 38.2*   < > 32.6* 32.4* 33.6* 33.3* 32.4*  MCV 85.5   < > 80.7 81.6 82.2 83.0 82.0  PLT 236   < > 187 186 215 212 205   < > = values in this interval not displayed.   Cardiac Enzymes: No results for input(s): CKTOTAL, CKMB, CKMBINDEX, TROPONINI in the last 168 hours. BNP (last 3 results) No results for input(s): PROBNP in the last 8760 hours. CBG: Recent Labs  Lab 12/09/19 0726 12/09/19 1138 12/09/19 1627 12/09/19 2137 12/10/19 0722  GLUCAP 137* 161* 196* 185* 185*   D-Dimer: No results for input(s): DDIMER in the last 72 hours. Hgb A1c: No results for input(s): HGBA1C in the last 72 hours. Lipid Profile: No results for input(s): CHOL, HDL, LDLCALC, TRIG, CHOLHDL, LDLDIRECT in the last 72 hours. Thyroid function studies: No results for input(s): TSH, T4TOTAL, T3FREE, THYROIDAB in the last 72 hours.  Invalid input(s): FREET3 Anemia work up: No results for input(s): VITAMINB12, FOLATE, FERRITIN, TIBC, IRON, RETICCTPCT in the last 72 hours. Sepsis Labs: Recent Labs  Lab 12/04/19 1540 12/04/19 1715 12/05/19 0226 12/07/19 0916 12/08/19 0145 12/09/19 0158 12/10/19 0131  PROCALCITON  --   --   --  0.52 0.44 0.18  --   WBC 19.2*  --    < > 13.2*  12.0* 10.2 11.4*  LATICACIDVEN 2.1* 2.2*  --    --   --   --   --    < > = values in this interval not displayed.    Microbiology Recent Results (from the past 240 hour(s))  Respiratory Panel by RT PCR (Flu A&B, Covid) - Nasopharyngeal Swab     Status: None   Collection Time: 12/04/19  3:24 PM   Specimen: Nasopharyngeal Swab  Result Value Ref Range Status   SARS Coronavirus 2 by RT PCR NEGATIVE NEGATIVE Final    Comment: (NOTE) SARS-CoV-2 target nucleic acids are NOT DETECTED.  The SARS-CoV-2 RNA is generally detectable in upper respiratoy specimens during the acute phase of infection. The lowest concentration of SARS-CoV-2 viral copies this assay can detect is 131 copies/mL. A negative result does not preclude SARS-Cov-2 infection and should not be used as the sole basis for treatment or other patient management decisions. A negative result may occur with  improper specimen collection/handling, submission of specimen other than nasopharyngeal swab, presence of viral mutation(s) within the areas targeted by this assay, and inadequate number of viral copies (<131 copies/mL). A negative result must be combined with clinical observations, patient history, and epidemiological information. The expected result is Negative.  Fact Sheet for Patients:  https://www.moore.com/  Fact Sheet for Healthcare Providers:  https://www.young.biz/  This test is no t yet approved or cleared by the [place] FDA and  has been authorized for detection and/or diagnosis of SARS-CoV-2 by FDA under an Emergency Use Authorization (EUA). This EUA will remain  in effect (meaning this test can be used) for the duration of the COVID-19 declaration under Section 564(b)(1) of the Act, 21 U.S.C. section 360bbb-3(b)(1), unless the authorization is terminated or revoked sooner.     Influenza A by PCR NEGATIVE NEGATIVE Final   Influenza B by PCR NEGATIVE NEGATIVE Final    Comment: (NOTE) The Xpert Xpress  SARS-CoV-2/FLU/RSV assay is intended as an aid in  the diagnosis of influenza from Nasopharyngeal swab specimens and  should not be used as a sole basis for treatment. Nasal washings and  aspirates are unacceptable for Xpert Xpress SARS-CoV-2/FLU/RSV  testing.  Fact Sheet for Patients: https://www.moore.com/  Fact Sheet for Healthcare Providers: https://www.young.biz/  This test is not yet approved or cleared by the [place] FDA and  has been authorized for detection and/or diagnosis of SARS-CoV-2 by  FDA under an Emergency Use Authorization (EUA). This EUA will remain  in effect (meaning this test can be used) for the duration of the  Covid-19 declaration under Section 564(b)(1) of the Act, 21  U.S.C. section 360bbb-3(b)(1), unless the authorization is  terminated or revoked. Performed at [facility], [address] [place], [place] [address]   Culture, blood (Routine x 2)     Status: None   Collection Time: 12/04/19  3:40 PM   Specimen: BLOOD LEFT WRIST  Result Value Ref Range Status   Specimen Description BLOOD LEFT WRIST  Final   Special Requests   Final    BOTTLES DRAWN AEROBIC ONLY Blood Culture results may not be optimal due to an inadequate volume of blood received in culture bottles   Culture   Final    NO GROWTH 5 DAYS Performed at [facility], [address] [place], [place] [address]    Report Status 12/09/2019 FINAL  Final  Culture, blood (Routine x 2)     Status: None   Collection Time: 12/04/19  3:44 PM   Specimen: BLOOD LEFT HAND  Result Value Ref Range Status   Specimen Description BLOOD LEFT HAND  Final   Special Requests   Final    BOTTLES DRAWN AEROBIC AND ANAEROBIC Blood Culture adequate volume   Culture   Final    NO GROWTH 5 DAYS Performed at [facility], [address] [place], [place] [address]    Report Status 12/09/2019 FINAL  Final  Urine culture     Status: Abnormal    Collection Time: 12/04/19  4:01 PM   Specimen: In/Out Cath Urine  Result Value Ref Range Status   Specimen Description IN/OUT CATH URINE  Final   Special Requests   Final    NONE Performed at [facility], [address], [place], [place] [address]    Culture (A)  Final    2,000 COLONIES/mL METHICILLIN RESISTANT STAPHYLOCOCCUS AUREUS   Report Status 12/06/2019 FINAL  Final   Organism ID, Bacteria METHICILLIN RESISTANT STAPHYLOCOCCUS AUREUS (A)  Final      Susceptibility   Methicillin resistant staphylococcus aureus - MIC*    CIPROFLOXACIN >=8 RESISTANT Resistant     GENTAMICIN <=0.5 SENSITIVE Sensitive     NITROFURANTOIN <=16 SENSITIVE Sensitive     OXACILLIN >=4 RESISTANT Resistant     TETRACYCLINE <=1 SENSITIVE Sensitive     VANCOMYCIN 1 SENSITIVE Sensitive     TRIMETH/SULFA <=10 SENSITIVE Sensitive     CLINDAMYCIN <=0.25 SENSITIVE Sensitive     RIFAMPIN <=0.5 SENSITIVE Sensitive     Inducible Clindamycin NEGATIVE Sensitive     * 2,000 COLONIES/mL METHICILLIN RESISTANT STAPHYLOCOCCUS AUREUS  MRSA PCR Screening     Status: Abnormal   Collection Time: 12/09/19 12:19 PM   Specimen: Nasal Mucosa; Nasopharyngeal  Result Value Ref Range Status   MRSA by PCR POSITIVE (A) NEGATIVE Final    Comment:        The GeneXpert MRSA Assay (FDA approved for NASAL specimens only), is one component of a comprehensive MRSA colonization surveillance program. It is not intended to diagnose MRSA infection nor to guide or monitor treatment for MRSA infections. CRITICAL RESULT CALLED TO, READ BACK BY AND VERIFIED WITH: RN [facility] COOPER AT 1416 12/09/19 BY MM. Performed at [facility], [address], [place], [place] [address]     Procedures and diagnostic studies:  No results found.  Medications:   . atorvastatin  80 mg Oral Q2000  . buPROPion  300 mg Oral Daily  . busPIRone  15 mg Oral TID  . carvedilol  12.5 mg Oral BID WC  . Chlorhexidine Gluconate Cloth  6 each Topical  Q0600  . docusate sodium  100 mg Oral BID  . heparin  5,000 Units Subcutaneous Q8H  . insulin aspart  0-15 Units Subcutaneous TID WC  . insulin aspart  0-5 Units Subcutaneous QHS  . insulin aspart  10 Units Subcutaneous TID WC  . insulin glargine  30 Units Subcutaneous Daily  . lisinopril  20 mg Oral Daily  . metroNIDAZOLE  500 mg Oral Q8H  . prazosin  2 mg Oral QHS   Continuous Infusions: . ceFEPime (MAXIPIME) IV 2 g (12/10/19 0532)  . vancomycin 1,250 mg (12/10/19 0929)     LOS: 6 days   Joseph Art  [facility]   How to contact the [facility] Attending or Consulting provider 7A - 7P or covering provider during after hours 7P -7A, for this patient?  1. Check the care team  in [facility] and look for a) attending/consulting [facility] provider listed and b) the [facility] team listed 2. Log into [email or web address] and use [facility]'s universal password to access. If you do not have the password, please contact the hospital operator. 3. Locate the [facility] provider you are looking for under [facility] and page to a number that you can be directly reached. 4. If you still have difficulty reaching the provider, please page the [facility] (Director on Call) for the Hospitalists listed on amion for assistance.  12/10/2019, 11:01 AM

## 2019-12-10 NOTE — Plan of Care (Signed)
°  Problem: Education: Goal: Knowledge of General Education information will improve Description: Including pain rating scale, medication(s)/side effects and non-pharmacologic comfort measures Outcome: Progressing   Problem: Health Behavior/Discharge Planning: Goal: Ability to manage health-related needs will improve Outcome: Progressing   Problem: Clinical Measurements: Goal: Ability to maintain clinical measurements within normal limits will improve Outcome: Progressing Goal: Will remain free from infection Outcome: Progressing Goal: Diagnostic test results will improve Outcome: Progressing Goal: Respiratory complications will improve Outcome: Progressing Goal: Cardiovascular complication will be avoided Outcome: Progressing   Problem: Activity: Goal: Risk for activity intolerance will decrease Outcome: Progressing   Problem: Nutrition: Goal: Adequate nutrition will be maintained Outcome: Progressing   Problem: Coping: Goal: Level of anxiety will decrease Outcome: Progressing   Problem: Elimination: Goal: Will not experience complications related to urinary retention Outcome: Progressing   Problem: Pain Managment: Goal: General experience of comfort will improve Outcome: Progressing   Problem: Safety: Goal: Ability to remain free from injury will improve Outcome: Progressing   Problem: Skin Integrity: Goal: Risk for impaired skin integrity will decrease Outcome: Progressing   Problem: Fluid Volume: Goal: Ability to maintain a balanced intake and output will improve Outcome: Progressing   Problem: Metabolic: Goal: Ability to maintain appropriate glucose levels will improve Outcome: Progressing   Problem: Nutritional: Goal: Maintenance of adequate nutrition will improve Outcome: Progressing Goal: Progress toward achieving an optimal weight will improve Outcome: Progressing   Problem: Skin Integrity: Goal: Risk for impaired skin integrity will  decrease Outcome: Progressing   Problem: Tissue Perfusion: Goal: Adequacy of tissue perfusion will improve Outcome: Progressing

## 2019-12-10 NOTE — Progress Notes (Signed)
Virtual Visit via Video Note  I connected with Mark [name] on 12/10/19 at 2:00 pm EST by video enabled telemedicine with the correct person using two identifiers.   I discussed the limitations of evaluation and management by telemedicine and the availability of in person appointments. The patient expressed understanding and agreed to proceed.  LOCATION: Patient: hospital Provider: home office    History of Present Illness: Pt was referred to [facility] OP therapy for PTSD, depression and anxiety by the [facility].     Observations/Objective: Verbalize positive and hopeful feelings of the future.  Patient presented depressed  for his individual virtual therapy session. Patient described his psychiatric symptoms and current life events. Patient is currently in the hospital, since last Friday. He was very sick but he is being medically monitored and stabilized. Unsure when he will be discharged. Patient feels lonely and depressed because his family has not responded to his medical and emotional needs. Clinician utilized CBT to address his thought processes.  Clinician explored emotional state and noted that having some crying spells was not an indication of being emotionally unwell.     Emailed patient CBT steps for negative thoughts. Will review these at next session.       Assessment and Plan: Counselor will continue to meet with patient to address treatment plan goals. Patient will continue to follow recommendations of providers and implement skills learned in session.  Follow Up Instructions: I discussed the assessment and treatment plan with the patient. The patient was provided an opportunity to ask questions and all were answered. The patient agreed with the plan and demonstrated an understanding of the instructions.   The patient was advised to call back or seek an in-person evaluation if the symptoms worsen or if the condition fails to improve as anticipated.  I provided 45  minutes of non-face-to-face time during this encounter.   Juline Sanderford S, LCAS  .

## 2019-12-10 NOTE — Progress Notes (Signed)
.sab                                                             [facility] Infectious Diseases Follow Up Note  Patient Identification: Patient Name: Mark [name] MRN: [id number] Admit Date: 12/04/2019  2:43 PM Age: 59 y.o.Today's Date: 12/10/2019   Reason for Visit: Follow up on cellulitis   Principal Problem:   Diabetic ketoacidosis associated with type 2 diabetes mellitus (HCC) Active Problems:   AKI (acute kidney injury) (HCC)   Depression with anxiety   Hypertension associated with diabetes (HCC)   Hyperlipidemia associated with type 2 diabetes mellitus (HCC)   Cellulitis of left lower extremity   Cellulitis of lower back   Pressure injury of skin   Antibiotics: Vanc/cefepime and Metronidazole Day 4   Assessment 1. Multiple sites of cellulitis ( Left Lower back, Rt Lower back and Left Posterior thigh) - improvement of the induration and tenderness in the Left lower back but the area in the Rt lower back has still significant tenderness and induration ? Evolving abscess. The area in the left posterior thigh clinically appears same from yesterday  - MRSA PCR is positive  ' 2. Healing Pilonidal Cyst in the Gluteal Fold 3. MRSA in Urine cx - no urinary symptoms. UA not concerning for UTI 4. Poorly controlled DM  5. DKA resolved  Recommendations  -Recommend surgical evaluation ? Evolving abscess in the Rt lower back I think he will need couple of more days of IV abx for the induration to improve at which point we can do oral antibiotics vs dalbavancin  -Continue same coverage of antibiotics for now  -Follow up medical records from [facility] -Discussed about decolonizing methods for MRSA infection upon discharge  given high probability of MRSA cellulitis/abscess -Will re-assess tomorrow  Rest of the management as per the primary team. Please call us with questions.  Thank you for the consult.  Rest of the management as per the primary team. Thank you for the consult. Please  page with pertinent questions or concerns.  Odette Fraction, MD Infectious Diseases  [facility]   To contact the attending provider between 8A-5P or the covering provider during after hours 5P-8A, please log into the web site [email or web address] and access using universal [place] password for that web site. If you do not have the password, please call the hospital operator. ______________________________________________________________________ Subjective patient seen and examined at the bedside. Denies any fever, chills. The pain and tenderness in the Left lower back and thigh seems to have improved somewhat from yesterday. Denies any issues with IV lines or IV abx  Vitals BP (!) 143/87 (BP Location: Right Arm)   Pulse 84   Temp 98.5 F (36.9 C) (Oral)   Resp 18   Ht 6\' 2"  (1.88 m)   Wt 122 kg   SpO2 100%   BMI 34.54 kg/m     Constitutional: NAD Comments:   Cardiovascular:  Rate and Rhythm: Normal rateand regular rhythm.  Heart sounds:  Pulmonary:  Effort: Pulmonary effort is normal.  Comments: Clear air bilaterally  Abdominal:  Palpations: Abdomen is soft. BS+ Tenderness: Non tender  Musculoskeletal:  General: No swellingor tenderness. Rt AKA  Skin: Comments: Improved tenderness and induration in the Left lateral back and Left  posterior thigh  Worsening tenderness and induration of the Rt lower back   Neurological:  General: No focal deficitpresent.   Psychiatric:  Mood and Affect: Moodnormal.    LINES/TUBES: PIV   METAL IMPLANT/HARDWARE:   Pertinent Microbiology Results for orders placed or performed during the hospital encounter of 12/04/19  Respiratory Panel by RT PCR (Flu A&B, Covid) - Nasopharyngeal Swab     Status: None   Collection Time: 12/04/19  3:24 PM   Specimen: Nasopharyngeal Swab  Result Value Ref Range Status   SARS Coronavirus 2 by RT PCR NEGATIVE NEGATIVE Final     Comment: (NOTE) SARS-CoV-2 target nucleic acids are NOT DETECTED.  The SARS-CoV-2 RNA is generally detectable in upper respiratoy specimens during the acute phase of infection. The lowest concentration of SARS-CoV-2 viral copies this assay can detect is 131 copies/mL. A negative result does not preclude SARS-Cov-2 infection and should not be used as the sole basis for treatment or other patient management decisions. A negative result may occur with  improper specimen collection/handling, submission of specimen other than nasopharyngeal swab, presence of viral mutation(s) within the areas targeted by this assay, and inadequate number of viral copies (<131 copies/mL). A negative result must be combined with clinical observations, patient history, and epidemiological information. The expected result is Negative.  Fact Sheet for Patients:  https://www.moore.com/  Fact Sheet for Healthcare Providers:  https://www.young.biz/  This test is no t yet approved or cleared by the [place] FDA and  has been authorized for detection and/or diagnosis of SARS-CoV-2 by FDA under an Emergency Use Authorization (EUA). This EUA will remain  in effect (meaning this test can be used) for the duration of the COVID-19 declaration under Section 564(b)(1) of the Act, 21 U.S.C. section 360bbb-3(b)(1), unless the authorization is terminated or revoked sooner.     Influenza A by PCR NEGATIVE NEGATIVE Final   Influenza B by PCR NEGATIVE NEGATIVE Final    Comment: (NOTE) The Xpert Xpress SARS-CoV-2/FLU/RSV assay is intended as an aid in  the diagnosis of influenza from Nasopharyngeal swab specimens and  should not be used as a sole basis for treatment. Nasal washings and  aspirates are unacceptable for Xpert Xpress SARS-CoV-2/FLU/RSV  testing.  Fact Sheet for Patients: https://www.moore.com/  Fact Sheet for Healthcare  Providers: https://www.young.biz/  This test is not yet approved or cleared by the [place] FDA and  has been authorized for detection and/or diagnosis of SARS-CoV-2 by  FDA under an Emergency Use Authorization (EUA). This EUA will remain  in effect (meaning this test can be used) for the duration of the  Covid-19 declaration under Section 564(b)(1) of the Act, 21  U.S.C. section 360bbb-3(b)(1), unless the authorization is  terminated or revoked. Performed at [facility], [address], [place], [place] [address]   Culture, blood (Routine x 2)     Status: None   Collection Time: 12/04/19  3:40 PM   Specimen: BLOOD LEFT WRIST  Result Value Ref Range Status   Specimen Description BLOOD LEFT WRIST  Final   Special Requests   Final    BOTTLES DRAWN AEROBIC ONLY Blood Culture results may not be optimal due to an inadequate volume of blood received in culture bottles   Culture   Final    NO GROWTH 5 DAYS Performed at [facility], [address] [place], [place] [address]    Report Status 12/09/2019 FINAL  Final  Culture, blood (Routine x 2)  Status: None   Collection Time: 12/04/19  3:44 PM   Specimen: BLOOD LEFT HAND  Result Value Ref Range Status   Specimen Description BLOOD LEFT HAND  Final   Special Requests   Final    BOTTLES DRAWN AEROBIC AND ANAEROBIC Blood Culture adequate volume   Culture   Final    NO GROWTH 5 DAYS Performed at [facility], [address] [place], [place] [address]    Report Status 12/09/2019 FINAL  Final  Urine culture     Status: Abnormal   Collection Time: 12/04/19  4:01 PM   Specimen: In/Out Cath Urine  Result Value Ref Range Status   Specimen Description IN/OUT CATH URINE  Final   Special Requests   Final    NONE Performed at [facility], [address] [place], [place] [address]    Culture (A)  Final    2,000 COLONIES/mL METHICILLIN RESISTANT STAPHYLOCOCCUS AUREUS   Report Status  12/06/2019 FINAL  Final   Organism ID, Bacteria METHICILLIN RESISTANT STAPHYLOCOCCUS AUREUS (A)  Final      Susceptibility   Methicillin resistant staphylococcus aureus - MIC*    CIPROFLOXACIN >=8 RESISTANT Resistant     GENTAMICIN <=0.5 SENSITIVE Sensitive     NITROFURANTOIN <=16 SENSITIVE Sensitive     OXACILLIN >=4 RESISTANT Resistant     TETRACYCLINE <=1 SENSITIVE Sensitive     VANCOMYCIN 1 SENSITIVE Sensitive     TRIMETH/SULFA <=10 SENSITIVE Sensitive     CLINDAMYCIN <=0.25 SENSITIVE Sensitive     RIFAMPIN <=0.5 SENSITIVE Sensitive     Inducible Clindamycin NEGATIVE Sensitive     * 2,000 COLONIES/mL METHICILLIN RESISTANT STAPHYLOCOCCUS AUREUS  MRSA PCR Screening     Status: Abnormal   Collection Time: 12/09/19 12:19 PM   Specimen: Nasal Mucosa; Nasopharyngeal  Result Value Ref Range Status   MRSA by PCR POSITIVE (A) NEGATIVE Final    Comment:        The GeneXpert MRSA Assay (FDA approved for NASAL specimens only), is one component of a comprehensive MRSA colonization surveillance program. It is not intended to diagnose MRSA infection nor to guide or monitor treatment for MRSA infections. CRITICAL RESULT CALLED TO, READ BACK BY AND VERIFIED WITH: [facility] CO[facility] AT 1416 12/09/19 BY MM. Performed at [facility], [address] [place], [place] [address]         Pertinent Lab CBC Latest Ref Rng & Units 12/10/2019 12/09/2019 12/08/2019  WBC 4.0 - 10.5 K/uL 11.4(H) 10.2 12.0(H)  Hemoglobin 13.0 - 17.0 g/dL 10.3(L) 10.5(L) 10.7(L)  Hematocrit 39 - 52 % 32.4(L) 33.3(L) 33.6(L)  Platelets 150 - 400 K/uL 205 212 215   CMP Latest Ref Rng & Units 12/10/2019 12/09/2019 12/08/2019  Glucose 70 - 99 mg/dL 914(N157(H) 829(F122(H) 621(H141(H)  BUN 6 - 20 mg/dL 15 15 16   Creatinine 0.61 - 1.24 mg/dL 0.861.02 5.780.90 4.690.99  Sodium 135 - 145 mmol/L 139 139 136  Potassium 3.5 - 5.1 mmol/L 4.1 3.8 3.3(L)  Chloride 98 - 111 mmol/L 106 107 104  CO2 22 - 32 mmol/L 22 21(L) 23  Calcium 8.9 - 10.3  mg/dL 9.0 9.0 9.2  Total Protein 6.5 - 8.1 g/dL - - -  Total Bilirubin 0.3 - 1.2 mg/dL - - -  Alkaline Phos 38 - 126 U/L - - -  AST 15 - 41 U/L - - -  ALT 0 - 44 U/L - - -    Pertinent Imaging today Plain films  and CT images have been personally visualized and interpreted; radiology reports have been reviewed. Decision making incorporated into the Impression / Recommendations.

## 2019-12-10 NOTE — Consult Note (Addendum)
Mark HoesEric [name] 1960-08-19  [id number].    Requesting MD: Dr. 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  He has undergone multiple incision and drainages of back abscesses by the dermatologist in August of this year.  He was treated with vancomycin.  He underwent dressing changes and these areas healed.  He also has a history of a pilonidal cyst that has been excised in the past.  The patient was in his normal state of health until last Tuesday when he began having increasing back pain.  He then noticed that his blood sugars, which according to him are normally well controlled, began to increase and were in the 500s.  He got significantly fatigued and malaised.  He called EMS and was transported to the hospital.  He has been admitted for DKA.  He has been found to have a right back abscess and we have been asked to evaluate him for further surgical management.  ROS: ROS: Please see HPI, otherwise all other systems have been reviewed and are negative.  Family History  Problem Relation Age of Onset  . Aneurysm Mother   . Leukemia Father   . Dementia Maternal Aunt   . Seizures Paternal Uncle     Past Medical History:  Diagnosis Date  . Above knee amputation status, right   . Anxiety   . Depression   . Diabetes mellitus, type II (HCC)   . Hyperlipemia   . Hypertension   . PTSD (post-traumatic stress disorder)   . Shoulder pain     Past Surgical History:  Procedure Laterality Date  . HERNIA REPAIR    . LEG AMPUTATION Right   . parenatal cyst      Social History:  reports that he quit smoking about 3 months ago. He has never used smokeless tobacco. He reports current alcohol use of about 4.0 standard drinks of alcohol per week. He reports current drug use. Frequency: 14.00 times per week. Drug:  Marijuana.  Allergies:  Allergies  Allergen Reactions  . Ozempic (0.25 Or 0.5 Mg-Dose) [Semaglutide(0.25 Or 0.5mg -Dos)] Other (See Comments)    Caused Pancreatitis  . Semaglutide Other (See Comments)    Caused pancreatitis  . Lyrica [Pregabalin] Rash    Medications Prior to Admission  Medication Sig Dispense Refill  . atorvastatin (LIPITOR) 80 MG tablet Take 80 mg by mouth daily.    Marland Kitchen. buPROPion (WELLBUTRIN XL) 300 MG 24 hr tablet Take 1 tablet (300 mg total) by mouth daily. 30 tablet 2  . busPIRone (BUSPAR) 15 MG tablet Take 1 tablet (15 mg total) by mouth 3 (three) times daily. 90 tablet 2  . carvedilol (COREG) 12.5 MG tablet Take 12.5 mg by mouth 2 (two) times daily with a meal.    . escitalopram (LEXAPRO) 20 MG tablet Take 20 mg by mouth daily.    . hydrOXYzine (VISTARIL) 25 MG capsule Take 25-50 mg by mouth 2 (two) times daily as needed for anxiety.     . insulin aspart (NOVOLOG) 100 UNIT/ML injection Inject 15 Units into the skin 3 (three) times daily before meals.    . Insulin Glargine (LANTUS [place]) Inject 25-50 Units into the skin See admin instructions. Taking 50 units in the Am and 25 units at bedtime    . lisinopril (ZESTRIL) 20 MG tablet Take 20 mg by  mouth daily.     Marland Kitchen oxyCODONE-acetaminophen (PERCOCET) 5-325 MG tablet Take 1-2 tablets by mouth every 4 (four) hours as needed for severe pain.     . pioglitazone (ACTOS) 15 MG tablet Take 15 mg by mouth daily.    . prazosin (MINIPRESS) 2 MG capsule Take 1 capsule (2 mg total) by mouth at bedtime. 30 capsule 2  . sildenafil (VIAGRA) 100 MG tablet Take 100 mg by mouth daily as needed for erectile dysfunction.    . traZODone (DESYREL) 50 MG tablet Take 1 tablet (50 mg total) by mouth at bedtime as needed. (Patient taking differently: Take 50 mg by mouth at bedtime as needed for sleep. ) 30 tablet 2     Physical Exam: Blood pressure (!) 147/76, pulse 82, temperature 98.5 F (36.9 C), temperature source Oral, resp. rate 18, height  6\' 2"  (1.88 m), weight 122 kg, SpO2 100 %. General: pleasant, obese black male who is laying in bed in NAD HEENT: head is normocephalic, atraumatic.  Sclera are noninjected.  PERRL.  Ears and nose without any masses or lesions.  Mouth is pink and moist Heart: regular, rate, and rhythm.  Normal s1,s2. No obvious murmurs, gallops, or rubs noted.  Palpable radial pulses bilaterally.  Palpable left pedal pulse. Lungs: CTAB, no wheezes, rhonchi, or rales noted.  Respiratory effort nonlabored Abd: soft, NT, ND, +BS, no masses, hernias, or organomegaly MS: all 4 extremities are symmetrical with no cyanosis, clubbing, or edema, except the right lower extremity as it is surgically absent above the knee.. Skin: warm and dry with no masses, lesions, or rashes.  He does have a inferior right back abscess.  This measures approximately 6 x 3 cm in induration.  This has multiple small chronic appearing tracts with some purulent drainage noted.  This is very tender to palpation.  He also has a very small area approximately 2 x 1 cm on his posterior left thigh.  This has not come to a "head" and start spontaneously draining lightly back has.  He does have what appears to be a chronic tract in his gluteal cleft from a previous pilonidal excision.  This does not appear to be infected or draining purulent fluid. Neuro: Cranial nerves 2-12 grossly intact, sensation is normal throughout Psych: A&Ox3 with an appropriate affect.   Results for orders placed or performed during the hospital encounter of 12/04/19 (from the past 48 hour(s))  Glucose, capillary     Status: Abnormal   Collection Time: 12/08/19 10:03 PM  Result Value Ref Range   Glucose-Capillary 127 (H) 70 - 99 mg/dL    Comment: Glucose reference range applies only to samples taken after fasting for at least 8 hours.  Basic metabolic panel     Status: Abnormal   Collection Time: 12/09/19  1:58 AM  Result Value Ref Range   Sodium 139 135 - 145 mmol/L    Potassium 3.8 3.5 - 5.1 mmol/L   Chloride 107 98 - 111 mmol/L   CO2 21 (L) 22 - 32 mmol/L   Glucose, Bld 122 (H) 70 - 99 mg/dL    Comment: Glucose reference range applies only to samples taken after fasting for at least 8 hours.   BUN 15 6 - 20 mg/dL   Creatinine, Ser 12/11/19 0.61 - 1.24 mg/dL   Calcium 9.0 8.9 - 4.01 mg/dL   GFR, Estimated 02.7 >25 mL/min    Comment: (NOTE) Calculated using the CKD-EPI Creatinine Equation (2021)    Anion gap 11  5 - 15    Comment: Performed at [facility], [address] [place], [place] [address]  CBC     Status: Abnormal   Collection Time: 12/09/19  1:58 AM  Result Value Ref Range   WBC 10.2 4.0 - 10.5 K/uL   RBC 4.01 (L) 4.22 - 5.81 MIL/uL   Hemoglobin 10.5 (L) 13.0 - 17.0 g/dL   HCT 23.3 (L) 39 - 52 %   MCV 83.0 80.0 - 100.0 fL   MCH 26.2 26.0 - 34.0 pg   MCHC 31.5 30.0 - 36.0 g/dL   RDW 00.7 62.2 - 63.3 %   Platelets 212 150 - 400 K/uL   nRBC 0.0 0.0 - 0.2 %    Comment: Performed at [facility], [address], [place], [place] [address]  Procalcitonin     Status: None   Collection Time: 12/09/19  1:58 AM  Result Value Ref Range   Procalcitonin 0.18 ng/mL    Comment:        Interpretation: PCT (Procalcitonin) <= 0.5 ng/mL: Systemic infection (sepsis) is not likely. Local bacterial infection is possible. (NOTE)       Sepsis PCT Algorithm           Lower Respiratory Tract                                      Infection PCT Algorithm    ----------------------------     ----------------------------         PCT < 0.25 ng/mL                PCT < 0.10 ng/mL          Strongly encourage             Strongly discourage   discontinuation of antibiotics    initiation of antibiotics    ----------------------------     -----------------------------       PCT 0.25 - 0.50 ng/mL            PCT 0.10 - 0.25 ng/mL               OR       >80% decrease in PCT            Discourage initiation of                                             antibiotics      Encourage discontinuation           of antibiotics    ----------------------------     -----------------------------         PCT >= 0.50 ng/mL              PCT 0.26 - 0.50 ng/mL               AND        <80% decrease in PCT             Encourage initiation of                                             antibiotics  Encourage continuation           of antibiotics    ----------------------------     -----------------------------        PCT >= 0.50 ng/mL                  PCT > 0.50 ng/mL               AND         increase in PCT                  Strongly encourage                                      initiation of antibiotics    Strongly encourage escalation           of antibiotics                                     -----------------------------                                           PCT <= 0.25 ng/mL                                                 OR                                        > 80% decrease in PCT                                      Discontinue / Do not initiate                                             antibiotics  Performed at [facility], [address], [place], [place] [address]   Glucose, capillary     Status: Abnormal   Collection Time: 12/09/19  7:26 AM  Result Value Ref Range   Glucose-Capillary 137 (H) 70 - 99 mg/dL    Comment: Glucose reference range applies only to samples taken after fasting for at least 8 hours.  Glucose, capillary     Status: Abnormal   Collection Time: 12/09/19 11:38 AM  Result Value Ref Range   Glucose-Capillary 161 (H) 70 - 99 mg/dL    Comment: Glucose reference range applies only to samples taken after fasting for at least 8 hours.  MRSA PCR Screening     Status: Abnormal   Collection Time: 12/09/19 12:19 PM   Specimen: Nasal Mucosa; Nasopharyngeal  Result Value Ref Range   MRSA by PCR POSITIVE (A) NEGATIVE    Comment:        The GeneXpert MRSA Assay (FDA approved for NASAL  specimens only), is one component of a comprehensive MRSA  colonization surveillance program. It is not intended to diagnose MRSA infection nor to guide or monitor treatment for MRSA infections. CRITICAL RESULT CALLED TO, READ BACK BY AND VERIFIED WITH: RN [facility] Irmo COOPER AT 1416 12/09/19 BY MM. Performed at [facility], [address], , [place] [address]   Glucose, capillary     Status: Abnormal   Collection Time: 12/09/19  4:27 PM  Result Value Ref Range   Glucose-Capillary 196 (H) 70 - 99 mg/dL    Comment: Glucose reference range applies only to samples taken after fasting for at least 8 hours.  Glucose, capillary     Status: Abnormal   Collection Time: 12/09/19  9:37 PM  Result Value Ref Range   Glucose-Capillary 185 (H) 70 - 99 mg/dL    Comment: Glucose reference range applies only to samples taken after fasting for at least 8 hours.  Basic metabolic panel     Status: Abnormal   Collection Time: 12/10/19  1:31 AM  Result Value Ref Range   Sodium 139 135 - 145 mmol/L   Potassium 4.1 3.5 - 5.1 mmol/L   Chloride 106 98 - 111 mmol/L   CO2 22 22 - 32 mmol/L   Glucose, Bld 157 (H) 70 - 99 mg/dL    Comment: Glucose reference range applies only to samples taken after fasting for at least 8 hours.   BUN 15 6 - 20 mg/dL   Creatinine, Ser 8.11 0.61 - 1.24 mg/dL   Calcium 9.0 8.9 - 91.4 mg/dL   GFR, Estimated >78 >29 mL/min    Comment: (NOTE) Calculated using the CKD-EPI Creatinine Equation (2021)    Anion gap 11 5 - 15    Comment: Performed at [facility], [address] [place], [place] [address]  CBC     Status: Abnormal   Collection Time: 12/10/19  1:31 AM  Result Value Ref Range   WBC 11.4 (H) 4.0 - 10.5 K/uL   RBC 3.95 (L) 4.22 - 5.81 MIL/uL   Hemoglobin 10.3 (L) 13.0 - 17.0 g/dL   HCT 08.6 (L) 39 - 52 %   MCV 82.0 80.0 - 100.0 fL   MCH 26.1 26.0 - 34.0 pg   MCHC 31.8 30.0 - 36.0 g/dL   RDW 57.8 46.9 - 62.9 %   Platelets 205 150 - 400 K/uL   nRBC  0.0 0.0 - 0.2 %    Comment: Performed at [facility], [address], [place], [place] [address]  Glucose, capillary     Status: Abnormal   Collection Time: 12/10/19  7:22 AM  Result Value Ref Range   Glucose-Capillary 185 (H) 70 - 99 mg/dL    Comment: Glucose reference range applies only to samples taken after fasting for at least 8 hours.  Glucose, capillary     Status: Abnormal   Collection Time: 12/10/19 12:30 PM  Result Value Ref Range   Glucose-Capillary 246 (H) 70 - 99 mg/dL    Comment: Glucose reference range applies only to samples taken after fasting for at least 8 hours.   No results found.    Assessment/Plan Hypertension Diabetes, with DKA -treatment per medicine.  History of MRSA with right back abscess, and left posterior thigh abscess The patient has been evaluated and has an area on his back as well as a small area on his thigh that would likely benefit from surgical debridement.  I have discussed this with the patient.  We discussed how this is done  as well as the risks and complications including bleeding and infection.  We discussed that postsurgical wound care would start with normal saline wet-to-dry dressing changes.  He is familiar with all of this and is agreeable to proceed.  He will be n.p.o. after midnight in preparation for surgical intervention tomorrow.  This was discussed with the primary service as well.  FEN -n.p.o. after midnight VTE -Heparin ID -vancomycin, Flagyl   Letha Cape, [facility] Surgery 12/10/2019, 4:32 PM Please see Amion for pager number during day hours 7:00am-4:30pm or 7:00am -11:30am on weekends

## 2019-12-11 ENCOUNTER — Inpatient Hospital Stay (HOSPITAL_COMMUNITY): Payer: No Typology Code available for payment source | Admitting: Anesthesiology

## 2019-12-11 ENCOUNTER — Encounter (HOSPITAL_COMMUNITY): Payer: Self-pay | Admitting: Internal Medicine

## 2019-12-11 ENCOUNTER — Encounter (HOSPITAL_COMMUNITY)
Admission: EM | Disposition: A | Discharge status: Home / Self Care | Payer: Self-pay | Source: Home / Self Care | Attending: Internal Medicine

## 2019-12-11 DIAGNOSIS — L0291 Cutaneous abscess, unspecified: Secondary | ICD-10-CM

## 2019-12-11 DIAGNOSIS — E1110 Type 2 diabetes mellitus with ketoacidosis without coma: Secondary | ICD-10-CM | POA: Diagnosis not present

## 2019-12-11 DIAGNOSIS — N179 Acute kidney failure, unspecified: Secondary | ICD-10-CM | POA: Diagnosis not present

## 2019-12-11 DIAGNOSIS — L0591 Pilonidal cyst without abscess: Secondary | ICD-10-CM | POA: Diagnosis not present

## 2019-12-11 DIAGNOSIS — L03312 Cellulitis of back [any part except buttock]: Secondary | ICD-10-CM | POA: Diagnosis not present

## 2019-12-11 DIAGNOSIS — B9562 Methicillin resistant Staphylococcus aureus infection as the cause of diseases classified elsewhere: Secondary | ICD-10-CM | POA: Diagnosis not present

## 2019-12-11 DIAGNOSIS — L03116 Cellulitis of left lower limb: Secondary | ICD-10-CM | POA: Diagnosis not present

## 2019-12-11 HISTORY — PX: INCISION AND DRAINAGE ABSCESS: SHX5864

## 2019-12-11 HISTORY — PX: INCISION AND DRAINAGE: SHX5863

## 2019-12-11 LAB — GLUCOSE, CAPILLARY
Glucose-Capillary: 124 mg/dL — ABNORMAL HIGH (ref 70–99)
Glucose-Capillary: 172 mg/dL — ABNORMAL HIGH (ref 70–99)
Glucose-Capillary: 157 mg/dL — ABNORMAL HIGH (ref 70–99)
Glucose-Capillary: 176 mg/dL — ABNORMAL HIGH (ref 70–99)
Glucose-Capillary: 178 mg/dL — ABNORMAL HIGH (ref 70–99)
Glucose-Capillary: 282 mg/dL — ABNORMAL HIGH (ref 70–99)
Glucose-Capillary: 141 mg/dL — ABNORMAL HIGH (ref 70–99)

## 2019-12-11 SURGERY — INCISION AND DRAINAGE, ABSCESS
Anesthesia: General | Site: Thigh | Laterality: Left

## 2019-12-11 MED ORDER — OXYCODONE HCL 5 MG/5ML PO SOLN: 5.0000 mg | Freq: Once | ORAL | Status: AC | PRN

## 2019-12-11 MED ORDER — FENTANYL CITRATE (PF) 100 MCG/2ML IJ SOLN
25.0000 ug | INTRAMUSCULAR | Status: DC | PRN
  Administered 2019-12-11 (×3): 50 ug via INTRAVENOUS

## 2019-12-11 MED ORDER — SODIUM CHLORIDE 0.9 % IV SOLN: INTRAVENOUS | Status: DC | PRN

## 2019-12-11 MED ORDER — ONDANSETRON HCL 4 MG/2ML IJ SOLN: 4.0000 mg | Freq: Once | INTRAMUSCULAR | Status: DC | PRN

## 2019-12-11 MED ORDER — ONDANSETRON HCL 4 MG/2ML IJ SOLN
INTRAMUSCULAR | Status: DC | PRN
  Administered 2019-12-11: 4 mg via INTRAVENOUS

## 2019-12-11 MED ORDER — ROCURONIUM BROMIDE 10 MG/ML (PF) SYRINGE
PREFILLED_SYRINGE | INTRAVENOUS | Status: DC | PRN
  Administered 2019-12-11: 70 mg via INTRAVENOUS

## 2019-12-11 MED ORDER — FENTANYL CITRATE (PF) 100 MCG/2ML IJ SOLN: 25.0000 ug | INTRAMUSCULAR | Status: DC | PRN

## 2019-12-11 MED ORDER — MUPIROCIN 2 % EX OINT
TOPICAL_OINTMENT | Freq: Two times a day (BID) | CUTANEOUS | Status: AC
  Filled 2019-12-11 (×2): qty 22

## 2019-12-11 MED ORDER — LIDOCAINE 2% (20 MG/ML) 5 ML SYRINGE
INTRAMUSCULAR | Status: DC | PRN
  Administered 2019-12-11: 100 mg via INTRAVENOUS

## 2019-12-11 MED ORDER — FENTANYL CITRATE (PF) 100 MCG/2ML IJ SOLN
INTRAMUSCULAR | Status: AC
  Filled 2019-12-11: qty 2

## 2019-12-11 MED ORDER — MIDAZOLAM HCL 5 MG/5ML IJ SOLN
INTRAMUSCULAR | Status: DC | PRN
  Administered 2019-12-11: 2 mg via INTRAVENOUS

## 2019-12-11 MED ORDER — OXYCODONE HCL 5 MG PO TABS: 5.0000 mg | ORAL_TABLET | Freq: Once | ORAL | Status: DC | PRN

## 2019-12-11 MED ORDER — CHLORHEXIDINE GLUCONATE 0.12 % MT SOLN: 15.0000 mL | Freq: Once | OROMUCOSAL | Status: AC

## 2019-12-11 MED ORDER — HYDROMORPHONE HCL 1 MG/ML IJ SOLN
0.2500 mg | INTRAMUSCULAR | Status: DC | PRN
  Administered 2019-12-11 (×2): 0.5 mg via INTRAVENOUS

## 2019-12-11 MED ORDER — ORAL CARE MOUTH RINSE: 15.0000 mL | Freq: Once | OROMUCOSAL | Status: AC

## 2019-12-11 MED ORDER — OXYCODONE HCL 5 MG PO TABS
ORAL_TABLET | ORAL | Status: AC
  Filled 2019-12-11: qty 1

## 2019-12-11 MED ORDER — 0.9 % SODIUM CHLORIDE (POUR BTL) OPTIME
TOPICAL | Status: DC | PRN
  Administered 2019-12-11: 1000 mL

## 2019-12-11 MED ORDER — SUGAMMADEX SODIUM 200 MG/2ML IV SOLN
INTRAVENOUS | Status: DC | PRN
  Administered 2019-12-11 (×2): 200 mg via INTRAVENOUS

## 2019-12-11 MED ORDER — FENTANYL CITRATE (PF) 250 MCG/5ML IJ SOLN
INTRAMUSCULAR | Status: AC
  Filled 2019-12-11: qty 5

## 2019-12-11 MED ORDER — MIDAZOLAM HCL 2 MG/2ML IJ SOLN
INTRAMUSCULAR | Status: AC
  Filled 2019-12-11: qty 2

## 2019-12-11 MED ORDER — LACTATED RINGERS IV SOLN: INTRAVENOUS | Status: DC

## 2019-12-11 MED ORDER — HYDROMORPHONE HCL 1 MG/ML IJ SOLN
INTRAMUSCULAR | Status: AC
  Filled 2019-12-11: qty 1

## 2019-12-11 MED ORDER — PROPOFOL 10 MG/ML IV BOLUS
INTRAVENOUS | Status: AC
  Filled 2019-12-11: qty 20

## 2019-12-11 MED ORDER — FENTANYL CITRATE (PF) 250 MCG/5ML IJ SOLN
INTRAMUSCULAR | Status: DC | PRN
  Administered 2019-12-11: 100 ug via INTRAVENOUS

## 2019-12-11 MED ORDER — OXYCODONE HCL 5 MG/5ML PO SOLN: 5.0000 mg | Freq: Once | ORAL | Status: DC | PRN

## 2019-12-11 MED ORDER — OXYCODONE HCL 5 MG PO TABS
5.0000 mg | ORAL_TABLET | Freq: Once | ORAL | Status: AC | PRN
  Administered 2019-12-11: 5 mg via ORAL

## 2019-12-11 MED ORDER — PROPOFOL 10 MG/ML IV BOLUS
INTRAVENOUS | Status: DC | PRN
  Administered 2019-12-11: 200 mg via INTRAVENOUS

## 2019-12-11 MED ORDER — CHLORHEXIDINE GLUCONATE 0.12 % MT SOLN
OROMUCOSAL | Status: AC
  Administered 2019-12-11: 15 mL via OROMUCOSAL
  Filled 2019-12-11: qty 15

## 2019-12-11 SURGICAL SUPPLY — 31 items
BNDG GAUZE ELAST 4 BULKY (GAUZE/BANDAGES/DRESSINGS) ×3 IMPLANT
CANISTER SUCT 3000ML PPV (MISCELLANEOUS) ×3 IMPLANT
COVER SURGICAL LIGHT HANDLE (MISCELLANEOUS) ×3 IMPLANT
COVER WAND RF STERILE (DRAPES) ×3 IMPLANT
DRAPE LAPAROSCOPIC ABDOMINAL (DRAPES) IMPLANT
DRSG MEPILEX SACRM 8.7X9.8 (GAUZE/BANDAGES/DRESSINGS) ×3 IMPLANT
DRSG PAD ABDOMINAL 8X10 ST (GAUZE/BANDAGES/DRESSINGS) IMPLANT
ELECT CAUTERY BLADE 6.4 (BLADE) ×3 IMPLANT
ELECT REM PT RETURN 9FT ADLT (ELECTROSURGICAL) ×3
ELECTRODE REM PT RTRN 9FT ADLT (ELECTROSURGICAL) ×2 IMPLANT
GAUZE PACKING IODOFORM 1X5 (PACKING) ×3 IMPLANT
GAUZE SPONGE 4X4 12PLY STRL (GAUZE/BANDAGES/DRESSINGS) IMPLANT
GLOVE BIO SURGEON STRL SZ7.5 (GLOVE) ×3 IMPLANT
GLOVE BIOGEL PI IND STRL 8 (GLOVE) ×2 IMPLANT
GLOVE BIOGEL PI INDICATOR 8 (GLOVE) ×1
GOWN STRL REUS W/ TWL LRG LVL3 (GOWN DISPOSABLE) ×2 IMPLANT
GOWN STRL REUS W/TWL LRG LVL3 (GOWN DISPOSABLE) ×1
HANDPIECE INTERPULSE COAX TIP (DISPOSABLE)
KIT BASIN OR (CUSTOM PROCEDURE TRAY) ×3 IMPLANT
KIT TURNOVER KIT B (KITS) ×3 IMPLANT
NS IRRIG 1000ML POUR BTL (IV SOLUTION) ×3 IMPLANT
PACK GENERAL/GYN (CUSTOM PROCEDURE TRAY) ×3 IMPLANT
PAD ABD 8X10 STRL (GAUZE/BANDAGES/DRESSINGS) ×6 IMPLANT
PAD ARMBOARD 7.5X6 YLW CONV (MISCELLANEOUS) ×3 IMPLANT
PENCIL SMOKE EVACUATOR (MISCELLANEOUS) ×3 IMPLANT
SET HNDPC FAN SPRY TIP SCT (DISPOSABLE) IMPLANT
SWAB COLLECTION DEVICE MRSA (MISCELLANEOUS) IMPLANT
SWAB CULTURE ESWAB REG 1ML (MISCELLANEOUS) IMPLANT
TAPE CLOTH 4X10 WHT NS (GAUZE/BANDAGES/DRESSINGS) ×6 IMPLANT
TOWEL GREEN STERILE (TOWEL DISPOSABLE) ×3 IMPLANT
TOWEL GREEN STERILE FF (TOWEL DISPOSABLE) ×3 IMPLANT

## 2019-12-11 NOTE — Anesthesia Preprocedure Evaluation (Addendum)
Anesthesia Evaluation  Patient identified by MRN, date of birth, ID band Patient awake    Reviewed: Allergy & Precautions, NPO status , Patient's Chart, lab work & pertinent test results  Airway Mallampati: III  TM Distance: >3 FB Neck ROM: Full    Dental  (+) Teeth Intact, Dental Advisory Given   Pulmonary former smoker,    breath sounds clear to auscultation       Cardiovascular hypertension,  Rhythm:Regular Rate:Normal     Neuro/Psych    GI/Hepatic   Endo/Other  diabetes  Renal/GU      Musculoskeletal   Abdominal (+) + obese,   Peds  Hematology   Anesthesia Other Findings S/P R. BKA  Reproductive/Obstetrics                             Anesthesia Physical Anesthesia Plan  ASA: III  Anesthesia Plan: General   Post-op Pain Management:    Induction: Intravenous  PONV Risk Score and Plan: Ondansetron and Midazolam  Airway Management Planned: Oral ETT  Additional Equipment:   Intra-op Plan:   Post-operative Plan: Extubation in OR  Informed Consent: I have reviewed the patients History and Physical, chart, labs and discussed the procedure including the risks, benefits and alternatives for the proposed anesthesia with the patient or authorized representative who has indicated his/her understanding and acceptance.     Dental advisory given  Plan Discussed with: CRNA and Anesthesiologist  Anesthesia Plan Comments:         Anesthesia Quick Evaluation

## 2019-12-11 NOTE — Discharge Instructions (Signed)
Wet to Dry WOUND CARE: - Change dressing twice daily - Supplies: sterile saline, kerlex, scissors, ABD pads, tape  1. Remove dressing and all packing carefully, moistening with sterile saline as needed to avoid packing/internal dressing sticking to the wound. 2.   Clean edges of skin around the wound with water/gauze, making sure there is no tape debris or leakage left on skin that could cause skin irritation or breakdown. 3.   Dampen and clean kerlex with sterile saline and pack wound from wound base to skin level, making sure to take note of any possible areas of wound tracking, tunneling and packing appropriately. Wound can be packed loosely. Trim kerlex to size if a whole kerlex is not required. 4.   Cover wound with a dry ABD pad and secure with tape.  5.   Write the date/time on the dry dressing/tape to better track when the last dressing change occurred. - apply any skin protectant/powder if recommended by clinician to protect skin/skin folds. - change dressing as needed if leakage occurs, wound gets contaminated, or patient requests to shower. - You may shower daily with wound open and following the shower the wound should be dried and a clean dressing placed.  - Medical grade tape as well as packing supplies can be found at [employer] on [address] or [employer] on [address]. The remaining supplies can be found at your local drug store, [employer] etc.  Correction Insulin  Correction insulin, also called corrective insulin or a supplemental dose, is a small amount of insulin that can be used to lower your blood sugar (glucose) if it is too high. You may be instructed to check your blood glucose at certain times of the day and to use correction insulin as needed to lower your blood glucose to your target range. Correction insulin is primarily used as part of diabetes management. It may also be prescribed for people who do not have diabetes. What is a correction  scale? A correction scale, also called a sliding scale, is prescribed by your health care provider to help you determine when you need correction insulin. Your correction scale is based on your individual treatment goals, and it has two parts:  Ranges of blood glucose levels.  How much correction insulin to give yourself if your blood sugar falls within a certain range. If your blood glucose is in your desired range, you will not need correction insulin and you should take your normal insulin dose. What type of insulin do I need? Your health care provider may prescribe rapid-acting or short-acting insulin for you to use as correction insulin. Rapid-acting insulin:  Starts working quickly, in as little as 5 minutes.  Can last for 3-6 hours.  Works well when taken right before a meal to quickly lower blood glucose. Short-acting insulin:  Starts working in about 30 minutes.  Can last for 6-8 hours.  Should be taken about 30 minutes before you start eating a meal. Talk with your health care provider or pharmacist about which type of correction insulin to take and when to take it. If you use insulin to control your diabetes, you should use correction insulin in addition to the longer-acting (basal) insulin that you normally use. How do I manage my blood glucose with correction insulin? Giving a correction dose  Check your blood glucose as directed by your health care provider.  Use your correction scale to find the range that your blood glucose is in.  Identify the units  of insulin that match your blood glucose range.  Make sure you have food available that you can eat in the next 15-30 minutes, after your correction dose.  Give yourself the dose of correction insulin that your health care provider has prescribed in your correction scale. Always make sure you are using the right type of insulin. ? If your correction insulin is rapid-acting, start eating a meal within 15 minutes after  your correction dose to keep your blood glucose from getting too low. ? If your correction insulin is short-acting, start eating a meal within 30 minutes after your correction dose to keep your blood glucose from getting too low. Keeping a blood glucose log   Write down your blood glucose test results and the amount of insulin that you give yourself. Do this every time you check blood glucose or take insulin. Bring this log with you to your medical visits. This information will help your health care provider to manage your medicines.  Note anything that may affect your blood glucose, such as: ? Changes in normal exercise or activity. ? Changes in your normal schedule, such as changes in your sleep routine, going on vacation, changing your diet, or holidays. ? New over-the-counter or prescription medicines. ? Illness, stress, or anxiety. ? Changes in the time that you took your medicine or insulin. ? Changes in your meals, such as skipping a meal, having a late meal, or dining out. ? Eating things that may affect blood glucose, such as snacks, meal portions that are larger than normal, drinks that contain sugar, or eating less than usual. What do I need to know about hyperglycemia and hypoglycemia? What is hyperglycemia? Hyperglycemia, also called high blood glucose, occurs when blood glucose is too high. Make sure you know the early signs of hyperglycemia, such as:  Increased thirst.  Hunger.  Feeling very tired.  Needing to urinate more often than usual.  Blurry vision. What is hypoglycemia? Hypoglycemia is also called low blood glucose. Be aware of stacking your insulin doses. This happens when you correct a high blood glucose by giving yourself extra insulin too soon after a previous correction dose or mealtime dose. This may cause you to have too much insulin in your body and may put you at risk for hypoglycemia. Hypoglycemia occurs with a blood glucose level at or below 70 mg/dL  (3.9 mmol/L). It is important to know the symptoms of hypoglycemia and treat it right away. Always have a 15-gram rapid-acting carbohydrate snack with you to treat low blood glucose. Family members and close friends should also know the symptoms and should understand how to treat hypoglycemia, in case you are not able to treat yourself. What are the symptoms of hypoglycemia? Hypoglycemia symptoms can include:  Hunger.  Anxiety.  Sweating and feeling clammy.  Confusion.  Dizziness or light-headedness.  Sleepiness.  Nausea.  Increased heart rate.  Headache.  Blurry vision.  Jerky movements that you cannot control (seizure).  Nightmares.  Tingling or numbness around the mouth, lips, or tongue.  A change in speech.  Decreased ability to concentrate.  A change in coordination.  Restless sleep.  Tremors or shakes.  Fainting.  Irritability. How do I treat hypoglycemia? If you are alert and able to swallow safely, follow the 15:15 rule:  Take 15 grams of a rapid-acting carbohydrate. Rapid-acting options include: ? 1 tube of glucose gel. ? 3 glucose pills. ? 6-8 pieces of hard candy. ? 4 oz (120 mL) of fruit juice. ? 4  oz (120 mL) of regular (not diet) soda.  Check your blood glucose 15 minutes after you take the carbohydrate. ? If the repeat blood glucose level is still at or below 70 mg/dL (3.9 mmol/L), take 15 grams of a carbohydrate again. ? If your blood glucose level does not increase above 70 mg/dL (3.9 mmol/L) after 3 tries, seek emergency medical care.  After your blood glucose level returns to normal, eat a meal or a snack within 1 hour.  How do I treat severe hypoglycemia? Severe hypoglycemia is when your blood glucose level is at or below 54 mg/dL (3 mmol/L). Severe hypoglycemia is an emergency. Do not wait to see if the symptoms will go away. Get medical help right away. Call your local emergency services (911 in the [place] Do not drive yourself to the  hospital. If you have severe hypoglycemia and you cannot eat or drink, you may need an injection of glucagon. A family member or close friend should learn how to check your blood glucose and how to give you a glucagon injection. Ask your health care provider if you need to have an emergency glucagon injection kit available. Severe hypoglycemia may need to be treated in a hospital. The treatment may include getting glucose through an IV tube. You may also need treatment for the cause of your hypoglycemia. Why do I need correction insulin if I do not have diabetes? If you do not have diabetes, your health care provider may prescribe insulin because:  Keeping your blood glucose in the target range is important for your overall health.  You are taking medicines that cause your blood glucose to be higher than normal. Contact a health care provider if:  You develop low blood glucose that you are not able to treat yourself.  You have high blood glucose that you are not able to correct with correction insulin.  Your blood glucose is often too low.  You used emergency glucagon to treat low blood glucose. Get help right away if:  You become unresponsive. If this happens, someone else should call emergency services (911 in the [place] right away.  Your blood glucose is lower than 54 mg/dL (3.0 mmol/L).  You become confused or you have trouble thinking clearly.  You have difficulty breathing. Summary  Correction insulin is a small amount of insulin that can be used to lower your blood sugar (glucose) if it is too high.  Talk with your health care provider or pharmacist about which type of correction insulin to take and when to take it. If you use insulin to control your diabetes, you should use correction insulin in addition to the longer-acting (basal) insulin that you normally use.  You may be instructed to check your blood glucose at certain times of the day and to use correction insulin as  needed to lower your blood glucose to your target range. Always keep a log of your blood glucose values and the amount of insulin that you used.  It is important to know the symptoms of hypoglycemia and treat it right away. Always have a 15-gram rapid-acting carbohydrate snack with you to treat low blood glucose. Family members and close friends should also know the symptoms and should understand how to treat hypoglycemia, in case you are not able to treat yourself. This information is not intended to replace advice given to you by your health care provider. Make sure you discuss any questions you have with your health care provider. Document Revised: 01/19/2016 Document Reviewed:  10/07/2015 Elsevier Patient Education  2020 [employer] [facility] [facility] [phone]) 1. Dr. Philemon Kingdom 2. Dr. Janie Morning [facility] [phone]) 1. Dr. Delrae Rend [facility] [phone]) 1. Dr. Jacelyn Pi 2. Dr. Anda Kraft [facility] [phone]) 1. Dr. Daneil Dolin [facility] [phone]) [[place] office]  [phone]) [[place] office] 1. Dr. Lenna Sciara [place] 2. Dr. Judithann Sheen [facility]) [phone]) 1. Autumn Hudnall Ronnald Ramp), PA 2. Dr. Amalia Greenhouse 3. Dr. Marsh Dolly. [facility] [phone]) 1. Dr. Glade Lloyd [facility] [phone]) 1. Dr. Orville Govern 2. Dr. Lelon Huh 3. Dr. Jerelene Redden 4. Alwyn Ren, FNP Dr. Carolynn Serve. [facility] in [place] [phone])   Hemoglobin A1c Test Why am I having this test? You may have the hemoglobin A1c test (HbA1c test) done to:  Evaluate your risk for developing diabetes (diabetes mellitus).  Diagnose diabetes.  Monitor long-term control of blood sugar (glucose) in people who have diabetes and help make treatment  decisions. This test may be done with other blood glucose tests, such as fasting blood glucose and oral glucose tolerance tests. What is being tested? Hemoglobin is a type of protein in the blood that carries oxygen. Glucose attaches to hemoglobin to form glycated hemoglobin. This test checks the amount of glycated hemoglobin in your blood, which is a good indicator of the average amount of glucose in your blood during the past 2-3 months. What kind of sample is taken?  A blood sample is required for this test. It is usually collected by inserting a needle into a blood vessel. Tell a health care provider about:  All medicines you are taking, including vitamins, herbs, eye drops, creams, and over-the-counter medicines.  Any blood disorders you have.  Any surgeries you have had.  Any medical conditions you have.  Whether you are pregnant or may be pregnant. How are the results reported? Your results will be reported as a percentage that indicates how much of your hemoglobin has glucose attached to it (is glycated). Your health care provider will compare your results to normal ranges that were established after testing a large group of people (reference ranges). Reference ranges may vary among labs and hospitals. For this test, common reference ranges are:  Adult or child without diabetes: 4-5.6%.  Adult or child with diabetes and good blood glucose control: less than 7%. What do the results mean? If you have diabetes:  A result of less than 7% is considered normal, meaning that your blood glucose is well controlled.  A result higher than 7% means that your blood glucose is not well controlled, and your treatment plan may need to be adjusted. If you do not have diabetes:  A result within the reference range is considered normal, meaning that you are not at high risk for diabetes.  A result of 5.7-6.4% means that you have a high risk of developing diabetes, and you may have prediabetes.  Prediabetes is the condition of having a blood glucose level that is higher than it should be, but not high enough for you to be diagnosed with diabetes. Having prediabetes puts you at risk for developing type 2 diabetes (type 2 diabetes mellitus). You may have more tests, including a repeat HbA1c test.  Results of 6.5% or higher on two separate HbA1c tests mean that you have diabetes. You may have more tests to confirm the diagnosis. Abnormally low HbA1c values may be caused by:  Pregnancy.  Severe blood loss.  Receiving donated blood (transfusions).  Low red blood cell count (anemia).  Long-term kidney failure.  Some unusual forms (variants) of hemoglobin. Talk with your health care provider about what your results mean. Questions to ask your health care provider Ask your health care provider, or the department that is doing the test:  When will my results be ready?  How will I get my results?  What are my treatment options?  What other tests do I need?  What are my next steps? Summary  The hemoglobin A1c test (HbA1c test) may be done to evaluate your risk for developing diabetes, to diagnose diabetes, and to monitor long-term control of blood sugar (glucose) in people who have diabetes and help make treatment decisions.  Hemoglobin is a type of protein in the blood that carries oxygen. Glucose attaches to hemoglobin to form glycated hemoglobin. This test checks the amount of glycated hemoglobin in your blood, which is a good indicator of the average amount of glucose in your blood during the past 2-3 months.  Talk with your health care provider about what your results mean. This information is not intended to replace advice given to you by your health care provider. Make sure you discuss any questions you have with your health care provider. Document Revised: 12/21/2016 Document Reviewed: 08/21/2016 Elsevier Patient Education  [place] With Diabetes  Foods with carbohydrates make your blood glucose level go up. Learning how to count carbohydrates can help you control your blood glucose levels. First, identify the foods you eat that contain carbohydrates. Then, using the Foods with Carbohydrates chart, determine about how much carbohydrates are in your meals and snacks. Make sure you are eating foods with fiber, protein, and healthy fat along with your carbohydrate foods. Foods with Carbohydrates The following table shows carbohydrate foods that have about 15 grams of carbohydrate each. Using measuring cups, spoons, or a food scale when you first begin learning about carbohydrate counting can help you learn about the portion sizes you typically eat. The following foods have 15 grams carbohydrate each:  Grains  1 slice bread (1 ounce)   1 small tortilla (6-inch size)    large bagel (1 ounce)   1/3 cup pasta or rice (cooked)    hamburger or hot dog bun ( ounce)    cup cooked cereal    to  cup ready-to-eat cereal   2 taco shells (5-inch size) Fruit  1 small fresh fruit ( to 1 cup)    medium banana   17 small grapes (3 ounces)   1 cup melon or berries    cup canned or frozen fruit   2 tablespoons dried fruit (blueberries, cherries, cranberries, raisins)    cup unsweetened fruit juice  Starchy Vegetables   cup cooked beans, peas, corn, potatoes/sweet potatoes    large baked potato (3 ounces)   1 cup acorn or butternut squash  Snack Foods  3 to 6 crackers   8 potato chips or 13 tortilla chips ( ounce to 1 ounce)   3 cups popped popcorn  Dairy  3/4 cup (6 ounces) nonfat plain yogurt, or yogurt with sugar-free sweetener   1 cup milk   1 cup plain rice, soy, coconut or flavored almond milk Sweets and Desserts   cup ice cream or frozen yogurt   1 tablespoon jam, jelly, pancake syrup, table sugar, or honey   2 tablespoons light pancake syrup  1 inch square of frosted cake or  2 inch square of unfrosted cake   2 small cookies (2/3 ounce each) or  large cookie  Sometimes youll have to estimate carbohydrate amounts if you dont know the exact recipe. One cup of mixed foods like soups can have 1 to 2 carbohydrate servings, while some casseroles might have 2 or more servings of carbohydrate. Foods that have less than 20 calories in each serving can be counted as free foods. Count 1 cup raw vegetables, or  cup cooked non-starchy vegetables as free foods. If you eat 3 or more servings at one meal, then count them as 1 carbohydrate serving.  Foods without Carbohydrates  Not all foods contain carbohydrates. Meat, some dairy, fats, non-starchy vegetables, and many beverages dont contain carbohydrate. So when you count carbohydrates, you can generally exclude chicken, pork, beef, fish, seafood, eggs, tofu, cheese, butter, sour cream, avocado, nuts, seeds, olives, mayonnaise, water, black coffee, unsweetened tea, and zero-calorie drinks. Vegetables with no or low carbohydrate include green beans, cauliflower, tomatoes, and onions. How much carbohydrate should I eat at each meal?  Carbohydrate counting can help you plan your meals and manage your weight. Following are some starting points for carbohydrate intake at each meal. Work with your registered dietitian nutritionist to find the best range that works for your blood glucose and weight.   To Lose Weight To Maintain Weight  Women 2 - 3 carb servings 3 - 4 carb servings  Men 3 - 4 carb servings 4 - 5 carb servings  Checking your blood glucose after meals will help you know if you need to adjust the timing, type, or number of carbohydrate servings in your meal plan. Achieve and keep a healthy body weight by balancing your food intake and physical activity.  Tips How should I plan my meals?  Plan for half the food on your plate to include non-starchy vegetables, like salad greens, broccoli, or carrots. Try to eat 3 to 5  servings of non-starchy vegetables every day. Have a protein food at each meal. Protein foods include chicken, fish, meat, eggs, or beans (note that beans contain carbohydrate). These two food groups (non-starchy vegetables and proteins) are low in carbohydrate. If you fill up your plate with these foods, you will eat less carbohydrate but still fill up your stomach. Try to limit your carbohydrate portion to  of the plate.  What fats are healthiest to eat?  Diabetes increases risk for heart disease. To help protect your heart, eat more healthy fats, such as olive oil, nuts, and avocado. Eat less saturated fats like butter, cream, and high-fat meats, like bacon and sausage. Avoid trans fats, which are in all foods that list partially hydrogenated oil as an ingredient. What should I drink?  Choose drinks that are not sweetened with sugar. The healthiest choices are water, carbonated or seltzer waters, and tea and coffee without added sugars.  Sweet drinks will make your blood glucose go up very quickly. One serving of soda or energy drink is  cup. It is best to drink these beverages only if your blood glucose is low.  Artificially sweetened, or diet drinks, typically do not increase your blood glucose if they have zero calories in them. Read labels of beverages, as some diet drinks do have carbohydrate and will raise your blood glucose. Label Reading Tips Read Nutrition Facts labels to find out how many grams of carbohydrate are in a food you want to eat. Dont forget: sometimes  serving sizes on the label arent the same as how much food you are going to eat, so you may need to calculate how much carbohydrate is in the food you are serving yourself.   Carbohydrate Counting for People with Diabetes Sample 1-Day Menu  Breakfast  cup yogurt, low fat, low sugar (1 carbohydrate serving)   cup cereal, ready-to-eat, unsweetened (1 carbohydrate serving)  1 cup strawberries (1 carbohydrate serving)   cup  almonds ( carbohydrate serving)  Lunch 1, 5 ounce can chunk light tuna  2 ounces cheese, low fat cheddar  6 whole wheat crackers (1 carbohydrate serving)  1 small apple (1 carbohydrate servings)   cup carrots ( carbohydrate serving)   cup snap peas  1 cup 1% milk (1 carbohydrate serving)   Evening Meal Stir fry made with: 3 ounces chicken  1 cup brown rice (3 carbohydrate servings)   cup broccoli ( carbohydrate serving)   cup green beans   cup onions  1 tablespoon olive oil  2 tablespoons teriyaki sauce ( carbohydrate serving)  Evening Snack 1 extra small banana (1 carbohydrate serving)  1 tablespoon peanut butter   Carbohydrate Counting for People with Diabetes Vegan Sample 1-Day Menu  Breakfast 1 cup cooked oatmeal (2 carbohydrate servings)   cup blueberries (1 carbohydrate serving)  2 tablespoons flaxseeds  1 cup soymilk fortified with calcium and vitamin D  1 cup coffee  Lunch 2 slices whole wheat bread (2 carbohydrate servings)   cup baked tofu   cup lettuce  2 slices tomato  2 slices avocado   cup baby carrots ( carbohydrate serving)  1 orange (1 carbohydrate serving)  1 cup soymilk fortified with calcium and vitamin D   Evening Meal Burrito made with: 1 6-inch corn tortilla (1 carbohydrate serving)  1 cup refried vegetarian beans (2 carbohydrate servings)   cup chopped tomatoes   cup lettuce   cup salsa  1/3 cup brown rice (1 carbohydrate serving)  1 tablespoon olive oil for rice   cup zucchini   Evening Snack 6 small whole grain crackers (1 carbohydrate serving)  2 apricots ( carbohydrate serving)   cup unsalted peanuts ( carbohydrate serving)    Carbohydrate Counting for People with Diabetes Vegetarian (Lacto-Ovo) Sample 1-Day Menu  Breakfast 1 cup cooked oatmeal (2 carbohydrate servings)   cup blueberries (1 carbohydrate serving)  2 tablespoons flaxseeds  1 egg  1 cup 1% milk (1 carbohydrate serving)  1 cup coffee  Lunch 2 slices  whole wheat bread (2 carbohydrate servings)  2 ounces low-fat cheese   cup lettuce  2 slices tomato  2 slices avocado   cup baby carrots ( carbohydrate serving)  1 orange (1 carbohydrate serving)  1 cup unsweetened tea  Evening Meal Burrito made with: 1 6-inch corn tortilla (1 carbohydrate serving)   cup refried vegetarian beans (1 carbohydrate serving)   cup tomatoes   cup lettuce   cup salsa  1/3 cup brown rice (1 carbohydrate serving)  1 tablespoon olive oil for rice   cup zucchini  1 cup 1% milk (1 carbohydrate serving)  Evening Snack 6 small whole grain crackers (1 carbohydrate serving)  2 apricots ( carbohydrate serving)   cup unsalted peanuts ( carbohydrate serving)    Copyright 2020  Academy of [employer] Dietetics. All rights reserved.  Using Nutrition Labels: Carbohydrate   Serving Size   Look at the serving size. All the information on the label is based on this portion.  Servings Per [employer]  The number of servings contained in the package.  Guidelines for Carbohydrate   Look at the total grams of carbohydrate in the serving size.   1 carbohydrate choice = 15 grams of carbohydrate. Range of Carbohydrate Grams Per Choice  Carbohydrate Grams/Choice Carbohydrate Choices  6-10   11-20 1  21-25 1  26-35 2  36-40 2  41-50 3  51-55 3  56-65 4  66-70 4  71-80 5    Copyright 2020  [employer]. All rights reserved.

## 2019-12-11 NOTE — Progress Notes (Signed)
Report called to OR Short Stay RN. All questions answered. Confirmed that consent signed and in chart. Patient prepared for transport and taken to OR at 0810 via transporter. No noted distress from patient upon leaving the unit.

## 2019-12-11 NOTE — Op Note (Signed)
12/11/2019  10:07 AM  PATIENT:  Mark [name]  59 y.o. male  PRE-OPERATIVE DIAGNOSIS:  BACK ABSCESS, LEFT THIGH ABSCESS  POST-OPERATIVE DIAGNOSIS: BACK ABSCESS 3x4x4cm, LEFT THIGH ABSCESS 2 x 3 x 4 cm    PROCEDURE:  Procedure(s): INCISION AND DRAINAGE BACK ABSCESS, (Left) INCISION AND DRAINAGE POSTERIOR LEFT THIGH ABSCESS (Left)  SURGEON:  Surgeon(s) and Role:    Axel Filler, MD - Primary  ANESTHESIA:   general  EBL:  25 mL   BLOOD ADMINISTERED:none  DRAINS: none   LOCAL MEDICATIONS USED:  NONE  SPECIMEN:  Source of Specimen: Right back abscess  DISPOSITION OF SPECIMEN: Micro  COUNTS:  YES  TOURNIQUET:  * No tourniquets in log *  DICTATION: .Dragon Dictation  Details of procedure: After the patient was consented he was taken back to the OR and placed in supine position with bilateral SCDs in place.  He underwent general endotracheal intubation.  Patient was then placed in the prone position.  He was prepped and draped in standard fashion.  A timeout was called and all facts verified.  A elliptical incision was made in the area of his right abdomen process of his back.  Dissection was taken down to the subcutaneous fat.  There is large amount of purulence was expressed.  The abscess cavity tracked laterally.  All loculations were broken up bluntly.  Cautery was used to maintain hemostasis.  The area was irrigated out with sterile saline.  The area was then packed with saline soaked Kerlix.  Elliptical incision was made over the area of the left posterior thigh abscess.  This was very superficial.  There was some purulence that was expressed and some necrotic tissue debrided.  This was irrigated out with sterile saline.  1 inch iodoform gauze was placed to the wound.  The areas were then dressed with 4 x 4's and tape.  Patient tolerated the procedure well was taken to the recovery room in stable condition.  PLAN OF CARE: Admit to inpatient   PATIENT DISPOSITION:   PACU - hemodynamically stable.   Delay start of Pharmacological VTE agent (>24hrs) due to surgical blood loss or risk of bleeding: no

## 2019-12-11 NOTE — Transfer of Care (Signed)
Immediate Anesthesia Transfer of Care Note  Patient: Mark [name]  Procedure(s) Performed: INCISION AND DRAINAGE BACK ABSCESS, (Left Back) INCISION AND DRAINAGE POSTERIOR LEFT THIGH ABSCESS (Left Thigh)  Patient Location: PACU  Anesthesia Type:General  Level of Consciousness: awake, alert  and oriented  Airway & Oxygen Therapy: Patient Spontanous Breathing and Patient connected to face mask oxygen  Post-op Assessment: Report given to RN and Post -op Vital signs reviewed and stable  Post vital signs: Reviewed and stable  Last Vitals:  Vitals Value Taken Time  BP    Temp    Pulse    Resp    SpO2      Last Pain:  Vitals:   12/11/19 0800  TempSrc:   PainSc: 0-No pain      Patients Stated Pain Goal: 2 (12/09/19 1508)  Complications: No complications documented.

## 2019-12-11 NOTE — Anesthesia Procedure Notes (Signed)
Procedure Name: Intubation Date/Time: 12/11/2019 9:37 AM Performed by: Trinna Post., CRNA Pre-anesthesia Checklist: Patient identified, Emergency Drugs available, Suction available, Patient being monitored and Timeout performed Patient Re-evaluated:Patient Re-evaluated prior to induction Oxygen Delivery Method: Circle system utilized Preoxygenation: Pre-oxygenation with 100% oxygen Induction Type: IV induction Ventilation: Mask ventilation without difficulty and Oral airway inserted - appropriate to patient size Laryngoscope Size: Glidescope and 4 Grade View: Grade I Tube type: Oral Tube size: 7.5 mm Number of attempts: 3 Airway Equipment and Method: Rigid stylet and Video-laryngoscopy Placement Confirmation: ETT inserted through vocal cords under direct vision and breath sounds checked- equal and bilateral Secured at: 23 cm Tube secured with: Tape Dental Injury: Teeth and Oropharynx as per pre-operative assessment  Difficulty Due To: Difficult Airway- due to anterior larynx Comments: DVL x 1 with MAC 4 without view past epiglottis, DVL x 1 with Miller 3 by MDA with no view. Success with glidescope 4, no adverse events.

## 2019-12-11 NOTE — Progress Notes (Signed)
Marland Kitchensab .sab                                                             [facility] Infectious Diseases Follow Up Note  Patient Identification: Patient Name: Mark [name] MRN: [id number] Admit Date: 12/04/2019  2:43 PM Age: 59 y.o.Today's Date: 12/11/2019   Reason for Visit: Follow up on cellulitis   Principal Problem:   Diabetic ketoacidosis associated with type 2 diabetes mellitus (HCC) Active Problems:   AKI (acute kidney injury) (HCC)   Depression with anxiety   Hypertension associated with diabetes (HCC)   Hyperlipidemia associated with type 2 diabetes mellitus (HCC)   Cellulitis of left lower extremity   Cellulitis of lower back   Pressure injury of skin   Abscess   Antibiotics: Vanc/cefepime and Metronidazole Day 5   Assessment 1. Cellulitis and abscesses ( Left lower back/Rt lower back and left posterior thigh) - OR today with I and D of the Rt lower back abscess and left posterior thigh . 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 Healing Pilonidal Cyst in the Gluteal Fold 3. MRSA in Urine cx - no urinary symptoms. UA not concerning for UTI 4. Poorly controlled DM  5. DKA resolved  Recommendations  -Continue vancomycin, pharmacy to dose. Can DC cefepime/metronidazole with high suspicion for MRSA infection -Follow up [facility] -Will need MRSA decolonisation measures to avoid recurrent episodes upon discharge  ( nasal mupirocin and chlorhexidine bath). Discussed about it already  -Follow up OR cultures from today, might be a candidate for Oritavancin   Dr Luciana Axe ( pager [phone])  is available this weekend with questions.   Rest of the management as per the primary team. Thank you for the consult. Please page with pertinent questions or concerns.  Odette Fraction, MD Infectious Diseases  [facility]   To contact the attending provider between 8A-5P or the covering provider during after hours  5P-8A, please log into the web site [email or web address] and access using universal [place] password for that web site. If you do not have the password, please call the hospital operator. ______________________________________________________________________ Subjective Patient was off to OR for I and D of the abscesses   Vitals BP (!) 129/53 (BP Location: Left Arm)   Pulse 80   Temp 98 F (36.7 C)   Resp 14   Ht 6\' 2"  (1.88 m)   Wt 122 kg   SpO2 97%   BMI 34.54 kg/m    Pertinent Microbiology Results for orders placed or performed during the hospital encounter of 12/04/19  Respiratory Panel by RT PCR (Flu A&B, Covid) - Nasopharyngeal Swab     Status: None   Collection Time: 12/04/19  3:24 PM   Specimen: Nasopharyngeal Swab  Result Value Ref Range Status   SARS Coronavirus 2 by RT PCR NEGATIVE NEGATIVE Final    Comment: (NOTE) SARS-CoV-2 target nucleic acids are NOT DETECTED.  The SARS-CoV-2 RNA is generally detectable in upper respiratoy specimens during the acute phase of infection. The lowest concentration of SARS-CoV-2 viral copies this assay can detect is 131 copies/mL. A negative result does not preclude SARS-Cov-2 infection and should not be used as the sole basis for treatment or other patient management decisions. A negative  result may occur with  improper specimen collection/handling, submission of specimen other than nasopharyngeal swab, presence of viral mutation(s) within the areas targeted by this assay, and inadequate number of viral copies (<131 copies/mL). A negative result must be combined with clinical observations, patient history, and epidemiological information. The expected result is Negative.  Fact Sheet for Patients:  https://www.moore.com/  Fact Sheet for Healthcare Providers:  https://www.young.biz/  This test is no t yet approved or cleared by the [place] FDA and  has been authorized for detection  and/or diagnosis of SARS-CoV-2 by FDA under an Emergency Use Authorization (EUA). This EUA will remain  in effect (meaning this test can be used) for the duration of the COVID-19 declaration under Section 564(b)(1) of the Act, 21 U.S.C. section 360bbb-3(b)(1), unless the authorization is terminated or revoked sooner.     Influenza A by PCR NEGATIVE NEGATIVE Final   Influenza B by PCR NEGATIVE NEGATIVE Final    Comment: (NOTE) The Xpert Xpress SARS-CoV-2/FLU/RSV assay is intended as an aid in  the diagnosis of influenza from Nasopharyngeal swab specimens and  should not be used as a sole basis for treatment. Nasal washings and  aspirates are unacceptable for Xpert Xpress SARS-CoV-2/FLU/RSV  testing.  Fact Sheet for Patients: https://www.moore.com/  Fact Sheet for Healthcare Providers: https://www.young.biz/  This test is not yet approved or cleared by the [place] FDA and  has been authorized for detection and/or diagnosis of SARS-CoV-2 by  FDA under an Emergency Use Authorization (EUA). This EUA will remain  in effect (meaning this test can be used) for the duration of the  Covid-19 declaration under Section 564(b)(1) of the Act, 21  U.S.C. section 360bbb-3(b)(1), unless the authorization is  terminated or revoked. Performed at [facility], [address], [place], [place] [address]   Culture, blood (Routine x 2)     Status: None   Collection Time: 12/04/19  3:40 PM   Specimen: BLOOD LEFT WRIST  Result Value Ref Range Status   Specimen Description BLOOD LEFT WRIST  Final   Special Requests   Final    BOTTLES DRAWN AEROBIC ONLY Blood Culture results may not be optimal due to an inadequate volume of blood received in culture bottles   Culture   Final    NO GROWTH 5 DAYS Performed at [facility], [address], [place], [place] [address]    Report Status 12/09/2019 FINAL  Final  Culture, blood (Routine x 2)      Status: None   Collection Time: 12/04/19  3:44 PM   Specimen: BLOOD LEFT HAND  Result Value Ref Range Status   Specimen Description BLOOD LEFT HAND  Final   Special Requests   Final    BOTTLES DRAWN AEROBIC AND ANAEROBIC Blood Culture adequate volume   Culture   Final    NO GROWTH 5 DAYS Performed at [facility], [address], [place], [place] [address]    Report Status 12/09/2019 FINAL  Final  Urine culture     Status: Abnormal   Collection Time: 12/04/19  4:01 PM   Specimen: In/Out Cath Urine  Result Value Ref Range Status   Specimen Description IN/OUT CATH URINE  Final   Special Requests   Final    NONE Performed at [facility], [address] [place], [place] [address]    Culture (A)  Final    2,000 COLONIES/mL METHICILLIN RESISTANT STAPHYLOCOCCUS AUREUS   Report Status 12/06/2019 FINAL  Final   Organism ID, Bacteria METHICILLIN RESISTANT STAPHYLOCOCCUS AUREUS (A)  Final      Susceptibility   Methicillin resistant staphylococcus aureus - MIC*    CIPROFLOXACIN >=8 RESISTANT Resistant     GENTAMICIN <=0.5 SENSITIVE Sensitive     NITROFURANTOIN <=16 SENSITIVE Sensitive     OXACILLIN >=4 RESISTANT Resistant     TETRACYCLINE <=1 SENSITIVE Sensitive     VANCOMYCIN 1 SENSITIVE Sensitive     TRIMETH/SULFA <=10 SENSITIVE Sensitive     CLINDAMYCIN <=0.25 SENSITIVE Sensitive     RIFAMPIN <=0.5 SENSITIVE Sensitive     Inducible Clindamycin NEGATIVE Sensitive     * 2,000 COLONIES/mL METHICILLIN RESISTANT STAPHYLOCOCCUS AUREUS  MRSA PCR Screening     Status: Abnormal   Collection Time: 12/09/19 12:19 PM   Specimen: Nasal Mucosa; Nasopharyngeal  Result Value Ref Range Status   MRSA by PCR POSITIVE (A) NEGATIVE Final    Comment:        The GeneXpert MRSA Assay (FDA approved for NASAL specimens only), is one component of a comprehensive MRSA colonization surveillance program. It is not intended to diagnose MRSA infection nor to guide or monitor treatment  for MRSA infections. CRITICAL RESULT CALLED TO, READ BACK BY AND VERIFIED WITH: RN [facility] COOPER AT 1416 12/09/19 BY MM. Performed at [facility], [address], [place], [place] [address]      Pertinent Lab CBC Latest Ref Rng & Units 12/10/2019 12/09/2019 12/08/2019  WBC 4.0 - 10.5 K/uL 11.4(H) 10.2 12.0(H)  Hemoglobin 13.0 - 17.0 g/dL 10.3(L) 10.5(L) 10.7(L)  Hematocrit 39 - 52 % 32.4(L) 33.3(L) 33.6(L)  Platelets 150 - 400 K/uL 205 212 215   CMP Latest Ref Rng & Units 12/10/2019 12/09/2019 12/08/2019  Glucose 70 - 99 mg/dL 630(Z) 601(U) 932(T)  BUN 6 - 20 mg/dL 15 15 16   Creatinine 0.61 - 1.24 mg/dL 5.57 3.22  Sodium 135 - 145 mmol/L 139 139 136  Potassium 3.5 - 5.1 mmol/L 4.1 3.8 3.3(L)  Chloride 98 - 111 mmol/L 106 107 104  CO2 22 - 32 mmol/L 22 21(L) 23  Calcium 8.9 - 10.3 mg/dL 9.0 9.0 9.2  Total Protein 6.5 - 8.1 g/dL - - -  Total Bilirubin 0.3 - 1.2 mg/dL - - -  Alkaline Phos 38 - 126 U/L - - -  AST 15 - 41 U/L - - -  ALT 0 - 44 U/L - - -    Pertinent Imaging today Plain films and CT images have been personally visualized and interpreted; radiology reports have been reviewed. Decision making incorporated into the Impression / Recommendations.

## 2019-12-11 NOTE — Progress Notes (Signed)
Progress Note    Mark [name]  ZOX:[id number] DOB: [birth year]  DOA: 12/04/2019 PCP: [name]    Brief Narrative:     Medical records reviewed and are as summarized below:  Mark [name] is an 59 y.o. male with medical history significant forinsulin-dependent type 2 diabetes, hypertension, hyperlipidemia, right AKA, depression, anxiety, PTSD. Patient presented with DKA Also found to have cellulitis.  Slow to improve so ID consulted as well as GS for I/d in the OR.   Assessment/Plan:   Principal Problem:   Diabetic ketoacidosis associated with type 2 diabetes mellitus (HCC) Active Problems:   AKI (acute kidney injury) (HCC)   Depression with anxiety   Hypertension associated with diabetes (HCC)   Hyperlipidemia associated with type 2 diabetes mellitus (HCC)   Cellulitis of left lower extremity   Cellulitis of lower back   Pressure injury of skin   Abscess    DKA/DM type 2  -DKA resolved with insulin gtt -on lantus plus novolog -blood sugars improved  cellulitis -U/S shows no abscess -CT scan w/o abscess -IV abx per ID -went to the OR on 11/19 for I/D -wound care per general surgery -op cultures pending  Chronic/acute pain -PRN IV and PO  AKI -resolved  HTN -resume home meds  Depression/anxiety -buspar/wellbutrin   obesity Body mass index is 34.54 kg/m.   Family Communication/Anticipated D/C date and plan/Code Status   DVT prophylaxis: heparin Code Status: Full Code.  Disposition Plan: Status is: Inpatient  Remains inpatient appropriate because:Inpatient level of care appropriate due to severity of illness   Dispo: The patient is from: Home              Anticipated d/c is to: Home              Anticipated d/c date is: 2 days              Patient currently is not medically stable to d/c.         Medical Consultants:    ID  GS     Subjective:   Feels like the pressure from his skin has been relieved     Objective:    Vitals:   12/11/19 1040 12/11/19 1100 12/11/19 1115 12/11/19 1130  BP: (!) 144/91 (!) 122/51 (!) 122/45 (!) 129/53  Pulse: 82 81 85 80  Resp: 15 20 (!) 21 14  Temp:    98 F (36.7 C)  TempSrc:      SpO2: 98% 95% 100% 97%  Weight:      Height:        Intake/Output Summary (Last 24 hours) at 12/11/2019 1225 Last data filed at 12/11/2019 1100 Gross per 24 hour  Intake 2510.23 ml  Output 826 ml  Net 1684.23 ml   Filed Weights   12/04/19 1453 12/11/19 0843  Weight: 122 kg 122 kg    Exam:  General: Appearance:    Obese male in no acute distress     Lungs:      respirations unlabored  Heart:    Normal heart rate. Normal rhythm. No murmurs, rubs, or gallops.   MS:   All extremities are intact.   Neurologic:   Awake, alert, oriented x 3. No apparent focal neurological           defect.     Data Reviewed:   I have personally reviewed following labs and imaging studies:  Labs: Labs show the following:   Basic Metabolic Panel: Recent Labs  Lab 12/06/19 0152 12/06/19 0152 12/07/19 0235 12/07/19 0235 12/08/19 0145 12/08/19 0145 12/09/19 0158 12/10/19 0131  NA 133*  --  135  --  136  --  139 139  K 4.0   < > 3.3*   < > 3.3*   < > 3.8 4.1  CL 102  --  103  --  104  --  107 106  CO2 19*  --  24  --  23  --  21* 22  GLUCOSE 277*  --  199*  --  141*  --  122* 157*  BUN 29*  --  16  --  16  --  15 15  CREATININE 1.14  --  0.94  --  0.99  --  0.90 1.02  CALCIUM 9.0  --  9.1  --  9.2  --  9.0 9.0   < > = values in this interval not displayed.   GFR Estimated Creatinine Clearance: 108.2 mL/min (by C-G formula based on SCr of 1.02 mg/dL). Liver Function Tests: Recent Labs  Lab 12/04/19 1540  AST 8*  ALT 10  ALKPHOS 125  BILITOT 1.8*  PROT 8.5*  ALBUMIN 3.4*   No results for input(s): LIPASE, AMYLASE in the last 168 hours. No results for input(s): AMMONIA in the last 168 hours. Coagulation profile Recent Labs  Lab 12/04/19 1540  INR 1.1     CBC: Recent Labs  Lab 12/04/19 1540 12/04/19 1732 12/06/19 0910 12/07/19 0916 12/08/19 0145 12/09/19 0158 12/10/19 0131  WBC 19.2*   < > 14.0* 13.2* 12.0* 10.2 11.4*  NEUTROABS 17.3*  --   --   --   --   --   --   HGB 11.7*   < > 10.5* 10.4* 10.7* 10.5* 10.3*  HCT 38.2*   < > 32.6* 32.4* 33.6* 33.3* 32.4*  MCV 85.5   < > 80.7 81.6 82.2 83.0 82.0  PLT 236   < > 187 186 215 212 205   < > = values in this interval not displayed.   Cardiac Enzymes: No results for input(s): CKTOTAL, CKMB, CKMBINDEX, TROPONINI in the last 168 hours. BNP (last 3 results) No results for input(s): PROBNP in the last 8760 hours. CBG: Recent Labs  Lab 12/10/19 1230 12/10/19 1625 12/10/19 2103 12/11/19 0705 12/11/19 1020  GLUCAP 246* 124* 172* 157* 176*   D-Dimer: No results for input(s): DDIMER in the last 72 hours. Hgb A1c: No results for input(s): HGBA1C in the last 72 hours. Lipid Profile: No results for input(s): CHOL, HDL, LDLCALC, TRIG, CHOLHDL, LDLDIRECT in the last 72 hours. Thyroid function studies: No results for input(s): TSH, T4TOTAL, T3FREE, THYROIDAB in the last 72 hours.  Invalid input(s): FREET3 Anemia work up: No results for input(s): VITAMINB12, FOLATE, FERRITIN, TIBC, IRON, RETICCTPCT in the last 72 hours. Sepsis Labs: Recent Labs  Lab 12/04/19 1540 12/04/19 1715 12/05/19 0226 12/07/19 0916 12/08/19 0145 12/09/19 0158 12/10/19 0131  PROCALCITON  --   --   --  0.52 0.44 0.18  --   WBC 19.2*  --    < > 13.2* 12.0* 10.2 11.4*  LATICACIDVEN 2.1* 2.2*  --   --   --   --   --    < > = values in this interval not displayed.    Microbiology Recent Results (from the past 240 hour(s))  Respiratory Panel by RT PCR (Flu A&B, Covid) - Nasopharyngeal Swab     Status: None   Collection Time: 12/04/19  3:24 PM   Specimen: Nasopharyngeal Swab  Result Value Ref Range Status   SARS Coronavirus 2 by RT PCR NEGATIVE NEGATIVE Final    Comment: (NOTE) SARS-CoV-2 target  nucleic acids are NOT DETECTED.  The SARS-CoV-2 RNA is generally detectable in upper respiratoy specimens during the acute phase of infection. The lowest concentration of SARS-CoV-2 viral copies this assay can detect is 131 copies/mL. A negative result does not preclude SARS-Cov-2 infection and should not be used as the sole basis for treatment or other patient management decisions. A negative result may occur with  improper specimen collection/handling, submission of specimen other than nasopharyngeal swab, presence of viral mutation(s) within the areas targeted by this assay, and inadequate number of viral copies (<131 copies/mL). A negative result must be combined with clinical observations, patient history, and epidemiological information. The expected result is Negative.  Fact Sheet for Patients:  https://www.moore.com/  Fact Sheet for Healthcare Providers:  https://www.young.biz/  This test is no t yet approved or cleared by the [place] FDA and  has been authorized for detection and/or diagnosis of SARS-CoV-2 by FDA under an Emergency Use Authorization (EUA). This EUA will remain  in effect (meaning this test can be used) for the duration of the COVID-19 declaration under Section 564(b)(1) of the Act, 21 U.S.C. section 360bbb-3(b)(1), unless the authorization is terminated or revoked sooner.     Influenza A by PCR NEGATIVE NEGATIVE Final   Influenza B by PCR NEGATIVE NEGATIVE Final    Comment: (NOTE) The Xpert Xpress SARS-CoV-2/FLU/RSV assay is intended as an aid in  the diagnosis of influenza from Nasopharyngeal swab specimens and  should not be used as a sole basis for treatment. Nasal washings and  aspirates are unacceptable for Xpert Xpress SARS-CoV-2/FLU/RSV  testing.  Fact Sheet for Patients: https://www.moore.com/  Fact Sheet for Healthcare  Providers: https://www.young.biz/  This test is not yet approved or cleared by the [place] FDA and  has been authorized for detection and/or diagnosis of SARS-CoV-2 by  FDA under an Emergency Use Authorization (EUA). This EUA will remain  in effect (meaning this test can be used) for the duration of the  Covid-19 declaration under Section 564(b)(1) of the Act, 21  U.S.C. section 360bbb-3(b)(1), unless the authorization is  terminated or revoked. Performed at [facility], [address], [place], [place] [address]   Culture, blood (Routine x 2)     Status: None   Collection Time: 12/04/19  3:40 PM   Specimen: BLOOD LEFT WRIST  Result Value Ref Range Status   Specimen Description BLOOD LEFT WRIST  Final   Special Requests   Final    BOTTLES DRAWN AEROBIC ONLY Blood Culture results may not be optimal due to an inadequate volume of blood received in culture bottles   Culture   Final    NO GROWTH 5 DAYS Performed at [facility], [address], [place], [place] [address]    Report Status 12/09/2019 FINAL  Final  Culture, blood (Routine x 2)     Status: None   Collection Time: 12/04/19  3:44 PM   Specimen: BLOOD LEFT HAND  Result Value Ref Range Status   Specimen Description BLOOD LEFT HAND  Final   Special Requests   Final    BOTTLES DRAWN AEROBIC AND ANAEROBIC Blood Culture adequate volume   Culture   Final    NO GROWTH 5 DAYS Performed at [facility], [address] [place], [place] [address]  Report Status 12/09/2019 FINAL  Final  Urine culture     Status: Abnormal   Collection Time: 12/04/19  4:01 PM   Specimen: In/Out Cath Urine  Result Value Ref Range Status   Specimen Description IN/OUT CATH URINE  Final   Special Requests   Final    NONE Performed at [facility], [address], [place], [place] [address]    Culture (A)  Final    2,000 COLONIES/mL METHICILLIN RESISTANT STAPHYLOCOCCUS AUREUS   Report Status  12/06/2019 FINAL  Final   Organism ID, Bacteria METHICILLIN RESISTANT STAPHYLOCOCCUS AUREUS (A)  Final      Susceptibility   Methicillin resistant staphylococcus aureus - MIC*    CIPROFLOXACIN >=8 RESISTANT Resistant     GENTAMICIN <=0.5 SENSITIVE Sensitive     NITROFURANTOIN <=16 SENSITIVE Sensitive     OXACILLIN >=4 RESISTANT Resistant     TETRACYCLINE <=1 SENSITIVE Sensitive     VANCOMYCIN 1 SENSITIVE Sensitive     TRIMETH/SULFA <=10 SENSITIVE Sensitive     CLINDAMYCIN <=0.25 SENSITIVE Sensitive     RIFAMPIN <=0.5 SENSITIVE Sensitive     Inducible Clindamycin NEGATIVE Sensitive     * 2,000 COLONIES/mL METHICILLIN RESISTANT STAPHYLOCOCCUS AUREUS  MRSA PCR Screening     Status: Abnormal   Collection Time: 12/09/19 12:19 PM   Specimen: Nasal Mucosa; Nasopharyngeal  Result Value Ref Range Status   MRSA by PCR POSITIVE (A) NEGATIVE Final    Comment:        The GeneXpert MRSA Assay (FDA approved for NASAL specimens only), is one component of a comprehensive MRSA colonization surveillance program. It is not intended to diagnose MRSA infection nor to guide or monitor treatment for MRSA infections. CRITICAL RESULT CALLED TO, READ BACK BY AND VERIFIED WITH: RN [facility] COOPER AT 1416 12/09/19 BY MM. Performed at [facility], [address] [place], [place] [address]     Procedures and diagnostic studies:  No results found.  Medications:    atorvastatin  80 mg Oral Q2000   buPROPion  300 mg Oral Daily   busPIRone  15 mg Oral TID   carvedilol  12.5 mg Oral BID WC   Chlorhexidine Gluconate Cloth  6 each Topical Q0600   fentaNYL       fentaNYL       heparin  5,000 Units Subcutaneous Q8H   HYDROmorphone       insulin aspart  0-15 Units Subcutaneous TID WC   insulin aspart  0-5 Units Subcutaneous QHS   insulin aspart  12 Units Subcutaneous TID WC   insulin glargine  30 Units Subcutaneous Daily   lisinopril  20 mg Oral Daily   mupirocin ointment   Nasal BID    oxyCODONE       prazosin  2 mg Oral QHS   Continuous Infusions:  ceFEPime (MAXIPIME) IV Stopped (12/11/19 0544)   lactated ringers 10 mL/hr at 12/11/19 1221   vancomycin 166.7 mL/hr at 12/11/19 0800     LOS: 7 days   Joseph Art  [facility]   How to contact the [facility] Attending or Consulting provider 7A - 7P or covering provider during after hours 7P -7A, for this patient?  1. Check the care team in [facility] and look for a) attending/consulting TR[facility] provider listed and b) the [facility] team listed 2. Log into [email or web address] and use [facility] universal password to access. If you do not have the password, please contact the hospital operator. 3. Locate the  TR[facility] provider you are looking for under [facility] and page to a number that you can be directly reached. 4. If you still have difficulty reaching the provider, please page the [facility] (Director on Call) for the Hospitalists listed on amion for assistance.  12/11/2019, 12:25 PM

## 2019-12-11 NOTE — TOC Progression Note (Signed)
Transition of Care [facility]) - Progression Note    Patient Details  Name: Mark [name] MRN: [id number] Date of Birth: 09/26/1960  [facility]) CM/SW Contact  Beckie Busing, RN Phone Number: [phone]  12/11/2019, 5:56 PM  Clinical Narrative:    Patient is currently active with [facility]        Expected Discharge Plan and Services                                                 Social Determinants of Health (SDOH) Interventions    Readmission Risk Interventions No flowsheet data found.

## 2019-12-11 NOTE — Progress Notes (Signed)
Subjective/Chief Complaint: No acute changes   Objective: Vital signs in last 24 hours: Temp:  [98.1 F (36.7 C)-98.5 F (36.9 C)] 98.2 F (36.8 C) (11/19 0529) Pulse Rate:  [82-106] 97 (11/19 0529) Resp:  [17-19] 17 (11/19 0529) BP: (119-147)/(39-87) 147/77 (11/19 0529) SpO2:  [100 %] 100 % (11/19 0529) Last BM Date: 12/10/19  Intake/Output from previous day: 11/18 0701 - 11/19 0700 In: 480 [P.O.:480] Out: 801 [Urine:800; Emesis/NG output:1] Intake/Output this shift: No intake/output data recorded.   Physical Exam: Blood pressure (!) 147/76, pulse 82, temperature 98.5 F (36.9 C), temperature source Oral, resp. rate 18, height 6\' 2"  (1.88 m), weight 122 kg, SpO2 100 %. General: pleasant, obese black male who is laying in bed in NAD HEENT: head is normocephalic, atraumatic.  Sclera are noninjected.  PERRL.  Ears and nose without any masses or lesions.  Mouth is pink and moist Heart: regular, rate, and rhythm.  Normal s1,s2. No obvious murmurs, gallops, or rubs noted.  Palpable radial pulses bilaterally.  Palpable left pedal pulse. Lungs: CTAB, no wheezes, rhonchi, or rales noted.  Respiratory effort nonlabored Abd: soft, NT, ND, +BS, no masses, hernias, or organomegaly MS: all 4 extremities are symmetrical with no cyanosis, clubbing, or edema, except the right lower extremity as it is surgically absent above the knee.. Skin: warm and dry with no masses, lesions, or rashes.  He does have a inferior right back abscess.  This measures approximately 6 x 3 cm in induration.  This has multiple small chronic appearing tracts with some purulent drainage noted.  This is very tender to palpation.  He also has a very small area approximately 2 x 1 cm on his posterior left thigh.  This has not come to a "head" and start spontaneously draining lightly back has.  He does have what appears to be a chronic tract in his gluteal cleft from a previous pilonidal excision.  This does not appear to  be infected or draining purulent fluid. Neuro: Cranial nerves 2-12 grossly intact, sensation is normal throughout Psych: A&Ox3 with an appropriate affect.  Lab Results:  Recent Labs    12/09/19 0158 12/10/19 0131  WBC 10.2 11.4*  HGB 10.5* 10.3*  HCT 33.3* 32.4*  PLT 212 205   BMET Recent Labs    12/09/19 0158 12/10/19 0131  NA 139 139  K 3.8 4.1  CL 107 106  CO2 21* 22  GLUCOSE 122* 157*  BUN 15 15  CREATININE 0.90 1.02  CALCIUM 9.0 9.0   PT/INR No results for input(s): LABPROT, INR in the last 72 hours. ABG No results for input(s): PHART, HCO3 in the last 72 hours.  Invalid input(s): PCO2, PO2  Studies/Results: No results found.  Anti-infectives: Anti-infectives (From admission, onward)   Start     Dose/Rate Route Frequency Ordered Stop   12/08/19 2200  ceFEPIme (MAXIPIME) 2 g in sodium chloride 0.9 % 100 mL IVPB        2 g 200 mL/hr over 30 Minutes Intravenous Every 8 hours 12/08/19 1149     12/08/19 2000  vancomycin (VANCOREADY) IVPB 1250 mg/250 mL        1,250 mg 166.7 mL/hr over 90 Minutes Intravenous Every 12 hours 12/08/19 1149     12/08/19 1400  metroNIDAZOLE (FLAGYL) tablet 500 mg        500 mg Oral Every 8 hours 12/08/19 1149     12/07/19 2200  doxycycline (VIBRA-TABS) tablet 100 mg  Status:  Discontinued  100 mg Oral Every 12 hours 12/07/19 1827 12/08/19 1149   12/06/19 1700  cefTRIAXone (ROCEPHIN) 2 g in sodium chloride 0.9 % 100 mL IVPB  Status:  Discontinued        2 g 200 mL/hr over 30 Minutes Intravenous Every 24 hours 12/06/19 0917 12/08/19 1149   12/06/19 1400  vancomycin (VANCOCIN) IVPB 1000 mg/200 mL premix  Status:  Discontinued        1,000 mg 200 mL/hr over 60 Minutes Intravenous Every 8 hours 12/06/19 0915 12/07/19 1827   12/05/19 1700  cefTRIAXone (ROCEPHIN) 2 g in sodium chloride 0.9 % 100 mL IVPB  Status:  Discontinued        2 g 200 mL/hr over 30 Minutes Intravenous Every 24 hours 12/04/19 2030 12/06/19 0915   12/05/19  1600  vancomycin (VANCOREADY) IVPB 1500 mg/300 mL  Status:  Discontinued        1,500 mg 150 mL/hr over 120 Minutes Intravenous Every 24 hours 12/04/19 1750 12/04/19 2029   12/04/19 1515  vancomycin (VANCOCIN) IVPB 1000 mg/200 mL premix  Status:  Discontinued        1,000 mg 200 mL/hr over 60 Minutes Intravenous  Once 12/04/19 1510 12/04/19 1512   12/04/19 1515  cefTRIAXone (ROCEPHIN) 2 g in sodium chloride 0.9 % 100 mL IVPB        2 g 200 mL/hr over 30 Minutes Intravenous  Once 12/04/19 1510 12/04/19 1758   12/04/19 1515  vancomycin (VANCOREADY) IVPB 2000 mg/400 mL        2,000 mg 200 mL/hr over 120 Minutes Intravenous  Once 12/04/19 1512 12/04/19 1846      Assessment/Plan: Hypertension Diabetes, with DKA -treatment per medicine.  History of MRSA with right back abscess, and left posterior thigh abscess  To OR today for I&D  LOS: 7 days    Axel Filler 12/11/2019

## 2019-12-11 NOTE — Progress Notes (Signed)
Pharmacy Antibiotic Note  Mark [name] is a 59 y.o. male admitted on 12/04/2019 with cellulitis.  Pharmacy has been consulted for vancomycin dosing. ID is following. Patient is s/p I&D of back and thigh abscess today. Patient has been receiving vancomycin 1250mg  q12h and cefepime 2g q8h.   Plan: Continue vancomycin 1250mg  IV q12h, Goal trough 10-84mcg/mL Continue cefepime 2g IV q8h  Monitor C&S, renal function, VT as needed Follow up plan for antibiotics with ID  Height: 6\' 2"  (188 cm) Weight: 122 kg (269 lb) IBW/kg (Calculated) : 82.2  Temp (24hrs), Avg:98.1 F (36.7 C), Min:98 F (36.7 C), Max:98.2 F (36.8 C)  Recent Labs  Lab 12/04/19 1540 12/04/19 1715 12/04/19 2046 12/05/19 0226 12/06/19 0152 12/06/19 0910 12/07/19 0235 12/07/19 0916 12/08/19 0145 12/09/19 0158 12/10/19 0131  WBC 19.2*  --    < >   < >  --  14.0*  --  13.2* 12.0* 10.2 11.4*  CREATININE 2.29*  --    < >  --  1.14  --  0.94  --  0.99 0.90 1.02  LATICACIDVEN 2.1* 2.2*  --   --   --   --   --   --   --   --   --    < > = values in this interval not displayed.    Estimated Creatinine Clearance: 108.2 mL/min (by C-G formula based on SCr of 1.02 mg/dL).    Allergies  Allergen Reactions  . Ozempic (0.25 Or 0.5 Mg-Dose) [Semaglutide(0.25 Or 0.5mg -Dos)] Other (See Comments)    Caused Pancreatitis  . Semaglutide Other (See Comments)    Caused pancreatitis  . Lyrica [Pregabalin] Rash    Antimicrobials this admission: Vanc 11/12 x1; 11/14> CTX 11/12>>11/15 Cefepime 11/16>>11/19 Metronidazole 11/16>>   Dose adjustments this admission: N/a  Microbiology results: 11/12 Ucx>2k MRSA  11/12 Bcx> ngtd 11/19 abscess:   Thank you for allowing pharmacy to be a part of this patient's care.  13/12, PharmD Clinical Pharmacist  12/11/2019 12:21 PM  Please check [email or web address] for unit specific pharmacy phone numbers.

## 2019-12-12 ENCOUNTER — Inpatient Hospital Stay: Payer: Self-pay

## 2019-12-12 DIAGNOSIS — E11628 Type 2 diabetes mellitus with other skin complications: Secondary | ICD-10-CM

## 2019-12-12 DIAGNOSIS — L02212 Cutaneous abscess of back [any part, except buttock]: Secondary | ICD-10-CM | POA: Diagnosis not present

## 2019-12-12 DIAGNOSIS — L03116 Cellulitis of left lower limb: Secondary | ICD-10-CM | POA: Diagnosis not present

## 2019-12-12 DIAGNOSIS — N179 Acute kidney failure, unspecified: Secondary | ICD-10-CM | POA: Diagnosis not present

## 2019-12-12 DIAGNOSIS — B9562 Methicillin resistant Staphylococcus aureus infection as the cause of diseases classified elsewhere: Secondary | ICD-10-CM | POA: Diagnosis not present

## 2019-12-12 DIAGNOSIS — L02416 Cutaneous abscess of left lower limb: Secondary | ICD-10-CM | POA: Diagnosis not present

## 2019-12-12 DIAGNOSIS — E1110 Type 2 diabetes mellitus with ketoacidosis without coma: Secondary | ICD-10-CM | POA: Diagnosis not present

## 2019-12-12 LAB — GLUCOSE, CAPILLARY
Glucose-Capillary: 225 mg/dL — ABNORMAL HIGH (ref 70–99)
Glucose-Capillary: 281 mg/dL — ABNORMAL HIGH (ref 70–99)
Glucose-Capillary: 127 mg/dL — ABNORMAL HIGH (ref 70–99)
Glucose-Capillary: 186 mg/dL — ABNORMAL HIGH (ref 70–99)

## 2019-12-12 LAB — VANCOMYCIN, TROUGH: Vancomycin Tr: 20 ug/mL (ref 15–20)

## 2019-12-12 MED ORDER — VANCOMYCIN HCL IN DEXTROSE 1-5 GM/200ML-% IV SOLN
1000.0000 mg | Freq: Two times a day (BID) | INTRAVENOUS | Status: DC
  Administered 2019-12-12 – 2019-12-15 (×6): 1000 mg via INTRAVENOUS
  Filled 2019-12-12 (×6): qty 200

## 2019-12-12 MED ORDER — SODIUM CHLORIDE 0.9% FLUSH
10.0000 mL | INTRAVENOUS | Status: DC | PRN
  Administered 2019-12-21: 10 mL

## 2019-12-12 MED ORDER — HYDROCERIN EX CREA
TOPICAL_CREAM | Freq: Two times a day (BID) | CUTANEOUS | Status: DC
  Filled 2019-12-12: qty 113

## 2019-12-12 NOTE — Progress Notes (Addendum)
[facility]   Reason for visit: Follow up on abscess  Interval History: s/p I and D, gram positive cocci on gram stain.  Blood cultures remained negative.  He is interested in placement at [facility] facility at discharge.    Physical Exam: Constitutional:  Vitals:   12/11/19 2115 12/12/19 0520  BP: 130/67 130/76  Pulse: 79 89  Resp: 18 18  Temp: 97.8 F (36.6 C) 97.8 F (36.6 C)  SpO2: 100% 100%   patient appears in NAD Respiratory: Normal respiratory effort; CTA B Cardiovascular: RRR GI: soft, nt, nd  Review of Systems: Constitutional: negative for fevers and chills Gastrointestinal: negative for nausea and diarrhea  Lab Results  Component Value Date   WBC 11.4 (H) 12/10/2019   HGB 10.3 (L) 12/10/2019   HCT 32.4 (L) 12/10/2019   MCV 82.0 12/10/2019   PLT 205 12/10/2019    Lab Results  Component Value Date   CREATININE 1.02 12/10/2019   BUN 15 12/10/2019   NA 139 12/10/2019   K 4.1 12/10/2019   CL 106 12/10/2019   CO2 22 12/10/2019    Lab Results  Component Value Date   ALT 10 12/04/2019   AST 8 (L) 12/04/2019   ALKPHOS 125 12/04/2019     Microbiology: Recent Results (from the past 240 hour(s))  Respiratory Panel by RT PCR (Flu A&B, Covid) - Nasopharyngeal Swab     Status: None   Collection Time: 12/04/19  3:24 PM   Specimen: Nasopharyngeal Swab  Result Value Ref Range Status   SARS Coronavirus 2 by RT PCR NEGATIVE NEGATIVE Final    Comment: (NOTE) SARS-CoV-2 target nucleic acids are NOT DETECTED.  The SARS-CoV-2 RNA is generally detectable in upper respiratoy specimens during the acute phase of infection. The lowest concentration of SARS-CoV-2 viral copies this assay can detect is 131 copies/mL. A negative result does not preclude SARS-Cov-2 infection and should not be used as the sole basis for treatment or other patient management decisions. A negative result may occur with  improper specimen collection/handling,  submission of specimen other than nasopharyngeal swab, presence of viral mutation(s) within the areas targeted by this assay, and inadequate number of viral copies (<131 copies/mL). A negative result must be combined with clinical observations, patient history, and epidemiological information. The expected result is Negative.  Fact Sheet for Patients:  https://www.moore.com/  Fact Sheet for Healthcare Providers:  https://www.young.biz/  This test is no t yet approved or cleared by the [place] FDA and  has been authorized for detection and/or diagnosis of SARS-CoV-2 by FDA under an Emergency Use Authorization (EUA). This EUA will remain  in effect (meaning this test can be used) for the duration of the COVID-19 declaration under Section 564(b)(1) of the Act, 21 U.S.C. section 360bbb-3(b)(1), unless the authorization is terminated or revoked sooner.     Influenza A by PCR NEGATIVE NEGATIVE Final   Influenza B by PCR NEGATIVE NEGATIVE Final    Comment: (NOTE) The Xpert Xpress SARS-CoV-2/FLU/RSV assay is intended as an aid in  the diagnosis of influenza from Nasopharyngeal swab specimens and  should not be used as a sole basis for treatment. Nasal washings and  aspirates are unacceptable for Xpert Xpress SARS-CoV-2/FLU/RSV  testing.  Fact Sheet for Patients: https://www.moore.com/  Fact Sheet for Healthcare Providers: https://www.young.biz/  This test is not yet approved or cleared by the [place] FDA and  has been authorized for detection and/or diagnosis of SARS-CoV-2 by  FDA under  an Emergency Use Authorization (EUA). This EUA will remain  in effect (meaning this test can be used) for the duration of the  Covid-19 declaration under Section 564(b)(1) of the Act, 21  U.S.C. section 360bbb-3(b)(1), unless the authorization is  terminated or revoked. Performed at [facility], [address] [place], [place] [address]   Culture, blood (Routine x 2)     Status: None   Collection Time: 12/04/19  3:40 PM   Specimen: BLOOD LEFT WRIST  Result Value Ref Range Status   Specimen Description BLOOD LEFT WRIST  Final   Special Requests   Final    BOTTLES DRAWN AEROBIC ONLY Blood Culture results may not be optimal due to an inadequate volume of blood received in culture bottles   Culture   Final    NO GROWTH 5 DAYS Performed at [facility], [address] [place], [place] [address]    Report Status 12/09/2019 FINAL  Final  Culture, blood (Routine x 2)     Status: None   Collection Time: 12/04/19  3:44 PM   Specimen: BLOOD LEFT HAND  Result Value Ref Range Status   Specimen Description BLOOD LEFT HAND  Final   Special Requests   Final    BOTTLES DRAWN AEROBIC AND ANAEROBIC Blood Culture adequate volume   Culture   Final    NO GROWTH 5 DAYS Performed at [facility], [address], [place], [place] [address]    Report Status 12/09/2019 FINAL  Final  Urine culture     Status: Abnormal   Collection Time: 12/04/19  4:01 PM   Specimen: In/Out Cath Urine  Result Value Ref Range Status   Specimen Description IN/OUT CATH URINE  Final   Special Requests   Final    NONE Performed at [facility], [address], [place], [place] [address]    Culture (A)  Final    2,000 COLONIES/mL METHICILLIN RESISTANT STAPHYLOCOCCUS AUREUS   Report Status 12/06/2019 FINAL  Final   Organism ID, Bacteria METHICILLIN RESISTANT STAPHYLOCOCCUS AUREUS (A)  Final      Susceptibility   Methicillin resistant staphylococcus aureus - MIC*    CIPROFLOXACIN >=8 RESISTANT Resistant     GENTAMICIN <=0.5 SENSITIVE Sensitive     NITROFURANTOIN <=16 SENSITIVE Sensitive     OXACILLIN >=4 RESISTANT Resistant     TETRACYCLINE <=1 SENSITIVE Sensitive     VANCOMYCIN 1 SENSITIVE Sensitive     TRIMETH/SULFA <=10 SENSITIVE Sensitive     CLINDAMYCIN <=0.25 SENSITIVE Sensitive     RIFAMPIN  <=0.5 SENSITIVE Sensitive     Inducible Clindamycin NEGATIVE Sensitive     * 2,000 COLONIES/mL METHICILLIN RESISTANT STAPHYLOCOCCUS AUREUS  MRSA PCR Screening     Status: Abnormal   Collection Time: 12/09/19 12:19 PM   Specimen: Nasal Mucosa; Nasopharyngeal  Result Value Ref Range Status   MRSA by PCR POSITIVE (A) NEGATIVE Final    Comment:        The GeneXpert MRSA Assay (FDA approved for NASAL specimens only), is one component of a comprehensive MRSA colonization surveillance program. It is not intended to diagnose MRSA infection nor to guide or monitor treatment for MRSA infections. CRITICAL RESULT CALLED TO, READ BACK BY AND VERIFIED WITH: RN [facility] COOPER AT 1416 12/09/19 BY MM. Performed at [facility], [address] [place], [place] [address]   Aerobic/Anaerobic Culture (surgical/deep wound)     Status: None (Preliminary result)   Collection  Time: 12/11/19  9:57 AM   Specimen: PATH Other; Body Fluid  Result Value Ref Range Status   Specimen Description ABSCESS  Final   Special Requests A BACK ABS  Final   Gram Stain   Final    MODERATE WBC PRESENT,BOTH PMN AND MONONUCLEAR RARE GRAM POSITIVE COCCI Performed at [facility], [address] [place], [place] [address]    Culture PENDING  Incomplete   Report Status PENDING  Incomplete    Impression/Plan:  1. Right back and left posterior thigh abscess - s/p debridement and gram stain with GPC.  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  Based on gram stain and history, most c/w MRSA.  On vancomycin and I have stopped cefepime.   He will need doxycycline for about 10 days further at discharge.   2.  Wound - continue wound changes per surgery   3.  MRSA colonization - would recommend decolonization protocol (chlorheixidine and mupirocin) after discharge.    4.  DM - Hgb A1c 10.3.  Will need better blood sugar control going forward.   [facility]  I will continue to monitor cultures.

## 2019-12-12 NOTE — Progress Notes (Signed)
[facility] Progress Note  1 Day Post-Op  Subjective: CC-  Surgical sites sore but pain well controlled. Ready for first dressing change.  Objective: Vital signs in last 24 hours: Temp:  [97.8 F (36.6 C)-98 F (36.7 C)] 97.8 F (36.6 C) (11/20 0520) Pulse Rate:  [79-89] 89 (11/20 0520) Resp:  [14-21] 18 (11/20 0520) BP: (122-144)/(45-91) 130/76 (11/20 0520) SpO2:  [95 %-100 %] 100 % (11/20 0520) Last BM Date: 12/10/19  Intake/Output from previous day: 11/19 0701 - 11/20 0700 In: 2824.6 [P.O.:120; I.V.:332.4; IV Piggyback:2222.2] Out: 1125 [Urine:1100; Blood:25] Intake/Output this shift: No intake/output data recorded.  PE: Gen:  Alert, NAD, pleasant Pulm:  rate and effort normal LLE: upper posterior left thigh wound open, periwound looks good without erythema or induration, no purulent drainage, some fibrinous exudate at base of wound   Back: right lower back wound open, periwound looks good without erythema or induration, no purulent drainage, some fibrinous exudate at base of wound     Lab Results:  Recent Labs    12/10/19 0131  WBC 11.4*  HGB 10.3*  HCT 32.4*  PLT 205   BMET Recent Labs    12/10/19 0131  NA 139  K 4.1  CL 106  CO2 22  GLUCOSE 157*  BUN 15  CREATININE 1.02  CALCIUM 9.0   PT/INR No results for input(s): LABPROT, INR in the last 72 hours. CMP     Component Value Date/Time   NA 139 12/10/2019 0131   K 4.1 12/10/2019 0131   CL 106 12/10/2019 0131   CO2 22 12/10/2019 0131   GLUCOSE 157 (H) 12/10/2019 0131   BUN 15 12/10/2019 0131   CREATININE 1.02 12/10/2019 0131   CALCIUM 9.0 12/10/2019 0131   PROT 8.5 (H) 12/04/2019 1540   ALBUMIN 3.4 (L) 12/04/2019 1540   AST 8 (L) 12/04/2019 1540   ALT 10 12/04/2019 1540   ALKPHOS 125 12/04/2019 1540   BILITOT 1.8 (H) 12/04/2019 1540   GFRNONAA >60 12/10/2019 0131   GFRAA >60 12/26/2017 2204   Lipase     Component Value Date/Time   LIPASE 159 (H) 12/26/2017 2204        Studies/Results: No results found.  Anti-infectives: Anti-infectives (From admission, onward)   Start     Dose/Rate Route Frequency Ordered Stop   12/08/19 2200  ceFEPIme (MAXIPIME) 2 g in sodium chloride 0.9 % 100 mL IVPB        2 g 200 mL/hr over 30 Minutes Intravenous Every 8 hours 12/08/19 1149     12/08/19 2000  vancomycin (VANCOREADY) IVPB 1250 mg/250 mL        1,250 mg 166.7 mL/hr over 90 Minutes Intravenous Every 12 hours 12/08/19 1149     12/08/19 1400  metroNIDAZOLE (FLAGYL) tablet 500 mg  Status:  Discontinued        500 mg Oral Every 8 hours 12/08/19 1149 12/11/19 1059   12/07/19 2200  doxycycline (VIBRA-TABS) tablet 100 mg  Status:  Discontinued        100 mg Oral Every 12 hours 12/07/19 1827 12/08/19 1149   12/06/19 1700  cefTRIAXone (ROCEPHIN) 2 g in sodium chloride 0.9 % 100 mL IVPB  Status:  Discontinued        2 g 200 mL/hr over 30 Minutes Intravenous Every 24 hours 12/06/19 0917 12/08/19 1149   12/06/19 1400  vancomycin (VANCOCIN) IVPB 1000 mg/200 mL premix  Status:  Discontinued        1,000 mg 200  mL/hr over 60 Minutes Intravenous Every 8 hours 12/06/19 0915 12/07/19 1827   12/05/19 1700  cefTRIAXone (ROCEPHIN) 2 g in sodium chloride 0.9 % 100 mL IVPB  Status:  Discontinued        2 g 200 mL/hr over 30 Minutes Intravenous Every 24 hours 12/04/19 2030 12/06/19 0915   12/05/19 1600  vancomycin (VANCOREADY) IVPB 1500 mg/300 mL  Status:  Discontinued        1,500 mg 150 mL/hr over 120 Minutes Intravenous Every 24 hours 12/04/19 1750 12/04/19 2029   12/04/19 1515  vancomycin (VANCOCIN) IVPB 1000 mg/200 mL premix  Status:  Discontinued        1,000 mg 200 mL/hr over 60 Minutes Intravenous  Once 12/04/19 1510 12/04/19 1512   12/04/19 1515  cefTRIAXone (ROCEPHIN) 2 g in sodium chloride 0.9 % 100 mL IVPB        2 g 200 mL/hr over 30 Minutes Intravenous  Once 12/04/19 1510 12/04/19 1758   12/04/19 1515  vancomycin (VANCOREADY) IVPB 2000 mg/400 mL        2,000  mg 200 mL/hr over 120 Minutes Intravenous  Once 12/04/19 1512 12/04/19 1846       Assessment/Plan Hypertension Diabetes, with DKA -treatment per medicine.  History of MRSA  Right back abscess, and left posterior thigh abscess S/p I&D 11/19 Dr. Derrell Lolling - POD#1 - culture pending, gram stain with gram positive cocci - Start BID wet to dry dressing changes. Ok to shower with wounds open. Follow culture, antibiotics per ID.  Patient lives alone, not sure who is going to help him with dressing changes but he is going to think about it. [facility] [place] [facility]; if unable to find wound care help at home he may benefit from ALF.  ID - currently maxipime/vancomycin 11/16>> FEN - CM diet VTE - SCDs, sq heparin Foley - none Follow up - [facility] clinic   LOS: 8 days    Franne Forts, [facility] 12/12/2019, 9:31 AM Please see Amion for pager number during day hours 7:00am-4:30pm

## 2019-12-12 NOTE — Progress Notes (Signed)
Peripherally Inserted Central Catheter Placement  The IV Nurse has discussed with the patient and/or persons authorized to consent for the patient, the purpose of this procedure and the potential benefits and risks involved with this procedure.  The benefits include less needle sticks, lab draws from the catheter, and the patient may be discharged home with the catheter. Risks include, but not limited to, infection, bleeding, blood clot (thrombus formation), and puncture of an artery; nerve damage and irregular heartbeat and possibility to perform a PICC exchange if needed/ordered by physician.  Alternatives to this procedure were also discussed.  Bard Power PICC patient education guide, fact sheet on infection prevention and patient information card has been provided to patient /or left at bedside.    PICC Placement Documentation  PICC Single Lumen 12/12/19 PICC Right Basilic 44 cm 0 cm (Active)  Indication for Insertion or Continuance of Line Prolonged intravenous therapies 12/12/19 1600  Exposed Catheter (cm) 0 cm 12/12/19 1600  Site Assessment Clean;Dry;Intact 12/12/19 1600  Line Status Flushed;Blood return noted;Saline locked 12/12/19 1600  Dressing Type Transparent 12/12/19 1600  Dressing Status Clean;Dry;Intact 12/12/19 1600  Antimicrobial disc in place? Yes 12/12/19 1600  Dressing Change Due 12/19/19 12/12/19 1600       Audrie Gallus 12/12/2019, 4:55 PM

## 2019-12-12 NOTE — Progress Notes (Signed)
A consult was placed to IV Therapy requesting a midline or a picc line;  Spoke with the RN;  Pt remains on IV Vancomycin;  If pt will require Vanc for more than 6 days, a Picc needs to be placed, as midlines are only used for 6 days or less of Vancomycin.  RN to place new consult as needed.

## 2019-12-12 NOTE — Progress Notes (Signed)
Pharmacy Antibiotic Note  Mark [name] is a 59 y.o. male admitted on 12/04/2019 with cellulitis.  Pharmacy has been consulted for vancomycin dosing. ID is following. Patient is s/p I&D of back and thigh abscess today. Patient has been receiving vancomycin 1250mg  q12h and cefepime 2g q8h. A VT was drawn appropriately prior to 0800 dose this morning and was 20. The goal for cellulitis is 10-15. Patients renal function is stable.    Plan: Decrease to vancomycin 1000mg  IV q12h, Goal trough 10-73mcg/mL Continue cefepime 2g IV q8h  Monitor C&S, renal function Consider another VT if clinical status changes or renal function is impacted  Follow up plan for antibiotics with ID  Height: 6\' 2"  (188 cm) Weight: 122 kg (269 lb) IBW/kg (Calculated) : 82.2  Temp (24hrs), Avg:97.9 F (36.6 C), Min:97.8 F (36.6 C), Max:98 F (36.7 C)  Recent Labs  Lab 12/06/19 0152 12/06/19 0910 12/07/19 0235 12/07/19 0916 12/08/19 0145 12/09/19 0158 12/10/19 0131 12/12/19 0728  WBC  --  14.0*  --  13.2* 12.0* 10.2 11.4*  --   CREATININE 1.14  --  0.94  --  0.99 0.90 1.02  --   VANCOTROUGH  --   --   --   --   --   --   --  20    Estimated Creatinine Clearance: 108.2 mL/min (by C-G formula based on SCr of 1.02 mg/dL).    Allergies  Allergen Reactions  . Ozempic (0.25 Or 0.5 Mg-Dose) [Semaglutide(0.25 Or 0.5mg -Dos)] Other (See Comments)    Caused Pancreatitis  . Semaglutide Other (See Comments)    Caused pancreatitis  . Lyrica [Pregabalin] Rash    Antimicrobials this admission: Vanc 11/12 x1; 11/14> CTX 11/12>>11/15 Cefepime 11/16>>11/19 Metronidazole 11/16>>   Dose adjustments this admission: Vancomycin 1250 Q12H>> Vancomycin 1000 Q12H   Microbiology results: 11/12 Ucx>2k MRSA  11/12 Bcx> ngtd 11/19 abscess:   Thank you for allowing pharmacy to be a part of this patient's care.  13/12, PharmD, MBA Pharmacy Resident [phone] 12/12/2019 10:56 AM

## 2019-12-12 NOTE — Plan of Care (Signed)
°  Problem: Education: °Goal: Knowledge of General Education information will improve °Description: Including pain rating scale, medication(s)/side effects and non-pharmacologic comfort measures °Outcome: Progressing °  °Problem: Health Behavior/Discharge Planning: °Goal: Ability to manage health-related needs will improve °Outcome: Progressing °  °Problem: Clinical Measurements: °Goal: Ability to maintain clinical measurements within normal limits will improve °Outcome: Progressing °Goal: Will remain free from infection °Outcome: Progressing °Goal: Diagnostic test results will improve °Outcome: Progressing °Goal: Respiratory complications will improve °Outcome: Progressing °Goal: Cardiovascular complication will be avoided °Outcome: Progressing °  °Problem: Activity: °Goal: Risk for activity intolerance will decrease °Outcome: Progressing °  °Problem: Nutrition: °Goal: Adequate nutrition will be maintained °Outcome: Progressing °  °Problem: Coping: °Goal: Level of anxiety will decrease °Outcome: Progressing °  °Problem: Elimination: °Goal: Will not experience complications related to urinary retention °Outcome: Progressing °  °Problem: Pain Managment: °Goal: General experience of comfort will improve °Outcome: Progressing °  °Problem: Safety: °Goal: Ability to remain free from injury will improve °Outcome: Progressing °  °Problem: Skin Integrity: °Goal: Risk for impaired skin integrity will decrease °Outcome: Progressing °  °Problem: Fluid Volume: °Goal: Ability to maintain a balanced intake and output will improve °Outcome: Progressing °  °Problem: Metabolic: °Goal: Ability to maintain appropriate glucose levels will improve °Outcome: Progressing °  °Problem: Nutritional: °Goal: Maintenance of adequate nutrition will improve °Outcome: Progressing °Goal: Progress toward achieving an optimal weight will improve °Outcome: Progressing °  °Problem: Skin Integrity: °Goal: Risk for impaired skin integrity will  decrease °Outcome: Progressing °  °Problem: Tissue Perfusion: °Goal: Adequacy of tissue perfusion will improve °Outcome: Progressing °  °

## 2019-12-12 NOTE — Progress Notes (Signed)
Progress Note    Mark [name]  JWJ:[id number] DOB: 03/20/1960  DOA: 12/04/2019 [facility]    Brief Narrative:     Medical records reviewed and are as summarized below:  Mark [name] is an 59 y.o. male with medical history significant forinsulin-dependent type 2 diabetes, hypertension, hyperlipidemia, right AKA, depression, anxiety, PTSD. Patient presented with DKA Also found to have cellulitis.  Slow to improve so ID consulted as well as GS for I/d in the OR.   Assessment/Plan:   Principal Problem:   Diabetic ketoacidosis associated with type 2 diabetes mellitus (HCC) Active Problems:   AKI (acute kidney injury) (HCC)   Depression with anxiety   Hypertension associated with diabetes (HCC)   Hyperlipidemia associated with type 2 diabetes mellitus (HCC)   Cellulitis of left lower extremity   Cellulitis of lower back   Pressure injury of skin   Abscess    DKA/DM type 2  -DKA resolved with insulin gtt -on lantus plus novolog -blood sugars improved  Cellulitis/abscess -IV abx per ID -went to the OR on 11/19 for I/D for 2 areas -wound care per general surgery -op cultures pending  Chronic/acute pain -PRN IV and PO  AKI -resolved  HTN -resume home meds  Depression/anxiety -buspar/wellbutrin  obesity Body mass index is 34.54 kg/m.   Family Communication/Anticipated D/C date and plan/Code Status   DVT prophylaxis: heparin Code Status: Full Code.  Disposition Plan: Status is: Inpatient  Remains inpatient appropriate because:Inpatient level of care appropriate due to severity of illness   Dispo: The patient is from: Home              Anticipated d/c is to: Home              Anticipated d/c date is: 2 days              Patient currently is not medically stable to d/c.- final abx recommendations as well as culture results, also may need placement at [facility] for wound care if family not able to help and patient not able to do.          Medical Consultants:    ID  GS     Subjective:   No current complaints, pain controlled   Objective:    Vitals:   12/11/19 1130 12/11/19 2115 12/12/19 0520 12/12/19 1213  BP: (!) 129/53 130/67 130/76 132/69  Pulse: 80 79 89 83  Resp: 14 18 18 19   Temp: 98 F (36.7 C) 97.8 F (36.6 C) 97.8 F (36.6 C) (!) 97.5 F (36.4 C)  TempSrc:  Oral Oral   SpO2: 97% 100% 100% 100%  Weight:      Height:        Intake/Output Summary (Last 24 hours) at 12/12/2019 1246 Last data filed at 12/12/2019 1242 Gross per 24 hour  Intake 914.4 ml  Output 1100 ml  Net -185.6 ml   Filed Weights   12/04/19 1453 12/11/19 0843  Weight: 122 kg 122 kg    Exam:    General: Appearance:    Obese male in no acute distress     Lungs:     respirations unlabored  Heart:    Normal heart rate. Normal rhythm. No murmurs, rubs, or gallops.   MS:   Right AKA  Neurologic:   Awake, alert, oriented x 3. No apparent focal neurological           defect.     Data Reviewed:   I  have personally reviewed following labs and imaging studies:  Labs: Labs show the following:   Basic Metabolic Panel: Recent Labs  Lab 12/06/19 0152 12/06/19 0152 12/07/19 0235 12/07/19 0235 12/08/19 0145 12/08/19 0145 12/09/19 0158 12/10/19 0131  NA 133*  --  135  --  136  --  139 139  K 4.0   < > 3.3*   < > 3.3*   < > 3.8 4.1  CL 102  --  103  --  104  --  107 106  CO2 19*  --  24  --  23  --  21* 22  GLUCOSE 277*  --  199*  --  141*  --  122* 157*  BUN 29*  --  16  --  16  --  15 15  CREATININE 1.14  --  0.94  --  0.99  --  0.90 1.02  CALCIUM 9.0  --  9.1  --  9.2  --  9.0 9.0   < > = values in this interval not displayed.   GFR Estimated Creatinine Clearance: 108.2 mL/min (by C-G formula based on SCr of 1.02 mg/dL). Liver Function Tests: No results for input(s): AST, ALT, ALKPHOS, BILITOT, PROT, ALBUMIN in the last 168 hours. No results for input(s): LIPASE, AMYLASE in the last 168 hours. No  results for input(s): AMMONIA in the last 168 hours. Coagulation profile No results for input(s): INR, PROTIME in the last 168 hours.  CBC: Recent Labs  Lab 12/06/19 0910 12/07/19 0916 12/08/19 0145 12/09/19 0158 12/10/19 0131  WBC 14.0* 13.2* 12.0* 10.2 11.4*  HGB 10.5* 10.4* 10.7* 10.5* 10.3*  HCT 32.6* 32.4* 33.6* 33.3* 32.4*  MCV 80.7 81.6 82.2 83.0 82.0  PLT 187 186 215 212 205   Cardiac Enzymes: No results for input(s): CKTOTAL, CKMB, CKMBINDEX, TROPONINI in the last 168 hours. BNP (last 3 results) No results for input(s): PROBNP in the last 8760 hours. CBG: Recent Labs  Lab 12/11/19 1229 12/11/19 1623 12/11/19 2115 12/12/19 0807 12/12/19 1214  GLUCAP 178* 282* 141* 225* 281*   D-Dimer: No results for input(s): DDIMER in the last 72 hours. Hgb A1c: No results for input(s): HGBA1C in the last 72 hours. Lipid Profile: No results for input(s): CHOL, HDL, LDLCALC, TRIG, CHOLHDL, LDLDIRECT in the last 72 hours. Thyroid function studies: No results for input(s): TSH, T4TOTAL, T3FREE, THYROIDAB in the last 72 hours.  Invalid input(s): FREET3 Anemia work up: No results for input(s): VITAMINB12, FOLATE, FERRITIN, TIBC, IRON, RETICCTPCT in the last 72 hours. Sepsis Labs: Recent Labs  Lab 12/07/19 0916 12/08/19 0145 12/09/19 0158 12/10/19 0131  PROCALCITON 0.52 0.44 0.18  --   WBC 13.2* 12.0* 10.2 11.4*    Microbiology Recent Results (from the past 240 hour(s))  Respiratory Panel by RT PCR (Flu A&B, Covid) - Nasopharyngeal Swab     Status: None   Collection Time: 12/04/19  3:24 PM   Specimen: Nasopharyngeal Swab  Result Value Ref Range Status   SARS Coronavirus 2 by RT PCR NEGATIVE NEGATIVE Final    Comment: (NOTE) SARS-CoV-2 target nucleic acids are NOT DETECTED.  The SARS-CoV-2 RNA is generally detectable in upper respiratoy specimens during the acute phase of infection. The lowest concentration of SARS-CoV-2 viral copies this assay can detect is 131  copies/mL. A negative result does not preclude SARS-Cov-2 infection and should not be used as the sole basis for treatment or other patient management decisions. A negative result may occur with  improper specimen collection/handling,  submission of specimen other than nasopharyngeal swab, presence of viral mutation(s) within the areas targeted by this assay, and inadequate number of viral copies (<131 copies/mL). A negative result must be combined with clinical observations, patient history, and epidemiological information. The expected result is Negative.  Fact Sheet for Patients:  https://www.moore.com/  Fact Sheet for Healthcare Providers:  https://www.young.biz/  This test is no t yet approved or cleared by the [place] FDA and  has been authorized for detection and/or diagnosis of SARS-CoV-2 by FDA under an Emergency Use Authorization (EUA). This EUA will remain  in effect (meaning this test can be used) for the duration of the COVID-19 declaration under Section 564(b)(1) of the Act, 21 U.S.C. section 360bbb-3(b)(1), unless the authorization is terminated or revoked sooner.     Influenza A by PCR NEGATIVE NEGATIVE Final   Influenza B by PCR NEGATIVE NEGATIVE Final    Comment: (NOTE) The Xpert Xpress SARS-CoV-2/FLU/RSV assay is intended as an aid in  the diagnosis of influenza from Nasopharyngeal swab specimens and  should not be used as a sole basis for treatment. Nasal washings and  aspirates are unacceptable for Xpert Xpress SARS-CoV-2/FLU/RSV  testing.  Fact Sheet for Patients: https://www.moore.com/  Fact Sheet for Healthcare Providers: https://www.young.biz/  This test is not yet approved or cleared by the [place] FDA and  has been authorized for detection and/or diagnosis of SARS-CoV-2 by  FDA under an Emergency Use Authorization (EUA). This EUA will remain  in effect (meaning  this test can be used) for the duration of the  Covid-19 declaration under Section 564(b)(1) of the Act, 21  U.S.C. section 360bbb-3(b)(1), unless the authorization is  terminated or revoked. Performed at [facility], [address], [place], [place] [address]   Culture, blood (Routine x 2)     Status: None   Collection Time: 12/04/19  3:40 PM   Specimen: BLOOD LEFT WRIST  Result Value Ref Range Status   Specimen Description BLOOD LEFT WRIST  Final   Special Requests   Final    BOTTLES DRAWN AEROBIC ONLY Blood Culture results may not be optimal due to an inadequate volume of blood received in culture bottles   Culture   Final    NO GROWTH 5 DAYS Performed at [facility], [address] [place], [place] [address]    Report Status 12/09/2019 FINAL  Final  Culture, blood (Routine x 2)     Status: None   Collection Time: 12/04/19  3:44 PM   Specimen: BLOOD LEFT HAND  Result Value Ref Range Status   Specimen Description BLOOD LEFT HAND  Final   Special Requests   Final    BOTTLES DRAWN AEROBIC AND ANAEROBIC Blood Culture adequate volume   Culture   Final    NO GROWTH 5 DAYS Performed at [facility], [address] [place], [place] [address]    Report Status 12/09/2019 FINAL  Final  Urine culture     Status: Abnormal   Collection Time: 12/04/19  4:01 PM   Specimen: In/Out Cath Urine  Result Value Ref Range Status   Specimen Description IN/OUT CATH URINE  Final   Special Requests   Final    NONE Performed at [facility], [address], [place], [place] [address]    Culture (A)  Final    2,000 COLONIES/mL METHICILLIN RESISTANT STAPHYLOCOCCUS AUREUS   Report Status 12/06/2019 FINAL  Final   Organism ID, Bacteria METHICILLIN RESISTANT  STAPHYLOCOCCUS AUREUS (A)  Final      Susceptibility   Methicillin resistant staphylococcus aureus - MIC*    CIPROFLOXACIN >=8 RESISTANT Resistant     GENTAMICIN <=0.5 SENSITIVE Sensitive     NITROFURANTOIN <=16  SENSITIVE Sensitive     OXACILLIN >=4 RESISTANT Resistant     TETRACYCLINE <=1 SENSITIVE Sensitive     VANCOMYCIN 1 SENSITIVE Sensitive     TRIMETH/SULFA <=10 SENSITIVE Sensitive     CLINDAMYCIN <=0.25 SENSITIVE Sensitive     RIFAMPIN <=0.5 SENSITIVE Sensitive     Inducible Clindamycin NEGATIVE Sensitive     * 2,000 COLONIES/mL METHICILLIN RESISTANT STAPHYLOCOCCUS AUREUS  MRSA PCR Screening     Status: Abnormal   Collection Time: 12/09/19 12:19 PM   Specimen: Nasal Mucosa; Nasopharyngeal  Result Value Ref Range Status   MRSA by PCR POSITIVE (A) NEGATIVE Final    Comment:        The GeneXpert MRSA Assay (FDA approved for NASAL specimens only), is one component of a comprehensive MRSA colonization surveillance program. It is not intended to diagnose MRSA infection nor to guide or monitor treatment for MRSA infections. CRITICAL RESULT CALLED TO, READ BACK BY AND VERIFIED WITH: RN [facility] COOPER AT 1416 12/09/19 BY MM. Performed at [facility], [address] [place], [place] [address]   Aerobic/Anaerobic Culture (surgical/deep wound)     Status: None (Preliminary result)   Collection Time: 12/11/19  9:57 AM   Specimen: PATH Other; Body Fluid  Result Value Ref Range Status   Specimen Description ABSCESS  Final   Special Requests A BACK ABS  Final   Gram Stain   Final    MODERATE WBC PRESENT,BOTH PMN AND MONONUCLEAR RARE GRAM POSITIVE COCCI Performed at [facility], [address], [place], [place] [address]    Culture FEW STAPHYLOCOCCUS AUREUS  Final   Report Status PENDING  Incomplete    Procedures and diagnostic studies:  [name] EKG SITE RITE  Result Date: 12/12/2019 If Site Rite image not attached, placement could not be confirmed due to current cardiac rhythm.   Medications:   . atorvastatin  80 mg Oral Q2000  . buPROPion  300 mg Oral Daily  . busPIRone  15 mg Oral TID  . carvedilol  12.5 mg Oral BID WC  . Chlorhexidine Gluconate Cloth  6 each Topical  Q0600  . heparin  5,000 Units Subcutaneous Q8H  . hydrocerin   Topical BID  . insulin aspart  0-15 Units Subcutaneous TID WC  . insulin aspart  0-5 Units Subcutaneous QHS  . insulin aspart  12 Units Subcutaneous TID WC  . insulin glargine  30 Units Subcutaneous Daily  . lisinopril  20 mg Oral Daily  . mupirocin ointment   Nasal BID  . prazosin  2 mg Oral QHS   Continuous Infusions: . lactated ringers 10 mL/hr at 12/11/19 1221  . vancomycin       LOS: 8 days   Joseph Art  [facility]   How to contact the [employer] Attending or Consulting provider 7A - 7P or covering provider during after hours 7P -7A, for this patient?  1. Check the care team in [facility] and look for a) attending/consulting TR[facility] provider listed and b) the [facility] team listed 2. Log into [email or web address] and use [place] universal password to access. If you do not have the password, please contact the hospital operator. 3. Locate the [facility] provider you are looking for under [facility] and  page to a number that you can be directly reached. 4. If you still have difficulty reaching the provider, please page the [facility] (Director on Call) for the Hospitalists listed on amion for assistance.  12/12/2019, 12:46 PM

## 2019-12-13 DIAGNOSIS — A4902 Methicillin resistant Staphylococcus aureus infection, unspecified site: Secondary | ICD-10-CM

## 2019-12-13 DIAGNOSIS — E1110 Type 2 diabetes mellitus with ketoacidosis without coma: Secondary | ICD-10-CM | POA: Diagnosis not present

## 2019-12-13 DIAGNOSIS — L03312 Cellulitis of back [any part except buttock]: Secondary | ICD-10-CM | POA: Diagnosis not present

## 2019-12-13 DIAGNOSIS — L0291 Cutaneous abscess, unspecified: Secondary | ICD-10-CM | POA: Diagnosis not present

## 2019-12-13 DIAGNOSIS — N179 Acute kidney failure, unspecified: Secondary | ICD-10-CM | POA: Diagnosis not present

## 2019-12-13 LAB — GLUCOSE, CAPILLARY
Glucose-Capillary: 161 mg/dL — ABNORMAL HIGH (ref 70–99)
Glucose-Capillary: 228 mg/dL — ABNORMAL HIGH (ref 70–99)
Glucose-Capillary: 221 mg/dL — ABNORMAL HIGH (ref 70–99)
Glucose-Capillary: 222 mg/dL — ABNORMAL HIGH (ref 70–99)

## 2019-12-13 LAB — CBC
HCT: 31.8 % — ABNORMAL LOW (ref 39.0–52.0)
Hemoglobin: 10 g/dL — ABNORMAL LOW (ref 13.0–17.0)
MCH: 26 pg (ref 26.0–34.0)
MCHC: 31.4 g/dL (ref 30.0–36.0)
MCV: 82.6 fL (ref 80.0–100.0)
Platelets: 221 10*3/uL (ref 150–400)
RBC: 3.85 MIL/uL — ABNORMAL LOW (ref 4.22–5.81)
RDW: 14.9 % (ref 11.5–15.5)
WBC: 9 10*3/uL (ref 4.0–10.5)
nRBC: 0 % (ref 0.0–0.2)

## 2019-12-13 LAB — BASIC METABOLIC PANEL
Anion gap: 7 (ref 5–15)
BUN: 12 mg/dL (ref 6–20)
CO2: 26 mmol/L (ref 22–32)
Calcium: 8.7 mg/dL — ABNORMAL LOW (ref 8.9–10.3)
Chloride: 104 mmol/L (ref 98–111)
Creatinine, Ser: 0.98 mg/dL (ref 0.61–1.24)
GFR, Estimated: >60 mL/min (ref >60)
Glucose, Bld: 143 mg/dL — ABNORMAL HIGH (ref 70–99)
Potassium: 3.9 mmol/L (ref 3.5–5.1)
Sodium: 137 mmol/L (ref 135–145)

## 2019-12-13 MED ORDER — HYDROMORPHONE HCL 1 MG/ML IJ SOLN
0.5000 mg | Freq: Four times a day (QID) | INTRAMUSCULAR | Status: DC | PRN
  Administered 2019-12-13 – 2019-12-14 (×3): 0.5 mg via INTRAVENOUS
  Filled 2019-12-13 (×3): qty 1

## 2019-12-13 NOTE — Plan of Care (Signed)
°  Problem: Education: °Goal: Knowledge of General Education information will improve °Description: Including pain rating scale, medication(s)/side effects and non-pharmacologic comfort measures °Outcome: Progressing °  °Problem: Health Behavior/Discharge Planning: °Goal: Ability to manage health-related needs will improve °Outcome: Progressing °  °Problem: Clinical Measurements: °Goal: Ability to maintain clinical measurements within normal limits will improve °Outcome: Progressing °Goal: Will remain free from infection °Outcome: Progressing °Goal: Diagnostic test results will improve °Outcome: Progressing °Goal: Respiratory complications will improve °Outcome: Progressing °Goal: Cardiovascular complication will be avoided °Outcome: Progressing °  °Problem: Activity: °Goal: Risk for activity intolerance will decrease °Outcome: Progressing °  °Problem: Nutrition: °Goal: Adequate nutrition will be maintained °Outcome: Progressing °  °Problem: Coping: °Goal: Level of anxiety will decrease °Outcome: Progressing °  °Problem: Elimination: °Goal: Will not experience complications related to urinary retention °Outcome: Progressing °  °Problem: Pain Managment: °Goal: General experience of comfort will improve °Outcome: Progressing °  °Problem: Safety: °Goal: Ability to remain free from injury will improve °Outcome: Progressing °  °Problem: Skin Integrity: °Goal: Risk for impaired skin integrity will decrease °Outcome: Progressing °  °Problem: Fluid Volume: °Goal: Ability to maintain a balanced intake and output will improve °Outcome: Progressing °  °Problem: Metabolic: °Goal: Ability to maintain appropriate glucose levels will improve °Outcome: Progressing °  °Problem: Nutritional: °Goal: Maintenance of adequate nutrition will improve °Outcome: Progressing °Goal: Progress toward achieving an optimal weight will improve °Outcome: Progressing °  °Problem: Skin Integrity: °Goal: Risk for impaired skin integrity will  decrease °Outcome: Progressing °  °Problem: Tissue Perfusion: °Goal: Adequacy of tissue perfusion will improve °Outcome: Progressing °  °

## 2019-12-13 NOTE — Anesthesia Postprocedure Evaluation (Signed)
Anesthesia Post Note  Patient: Mark [name]  Procedure(s) Performed: INCISION AND DRAINAGE BACK ABSCESS, (Left Back) INCISION AND DRAINAGE POSTERIOR LEFT THIGH ABSCESS (Left Thigh)     Patient location during evaluation: PACU Anesthesia Type: General Level of consciousness: awake and alert Pain management: pain level controlled Vital Signs Assessment: post-procedure vital signs reviewed and stable Respiratory status: spontaneous breathing, nonlabored ventilation, respiratory function stable and patient connected to nasal cannula oxygen Cardiovascular status: blood pressure returned to baseline and stable Postop Assessment: no apparent nausea or vomiting Anesthetic complications: no   No complications documented.  Last Vitals:  Vitals:   12/12/19 2124 12/13/19 0601  BP: 134/67 (!) 141/75  Pulse: 82 87  Resp: 18 18  Temp: 36.7 C 36.5 C  SpO2: 100% 100%    Last Pain:  Vitals:   12/13/19 0601  TempSrc: Oral  PainSc:                  Zephaniah Enyeart

## 2019-12-13 NOTE — Progress Notes (Signed)
PROGRESS NOTE    Mark [name]  ZOX:[id number]  DOB: 01-03-1961  PCP: [name] Admit date:12/04/2019 Hospital course: 58 y.o.malewith medical history significant forinsulin-dependent type 2 diabetes, hypertension, hyperlipidemia, right AKA, depression, anxiety, PTSD whois admitted with DKA/skin abscesses with cellulitis. Slow to improve, so ID consulted as well as GS for I/d in the OR.   Subjective:  Patient resting comfortably when I entered the room.  He reports 10 out of 10 pain and requesting IV pain medications as claims oral medications only lasting few hours.  Lives alone and unable to perform wound care/dressings given location on posterior backslash thigh.  Also reports noting a small nodule in the right side of scrotum while taking shower, not painful.  Objective: Vitals:   12/13/19 0601 12/13/19 0847 12/13/19 0848 12/13/19 1159  BP: (!) 141/75 (!) 133/58 (!) 133/58 (!) 151/70  Pulse: 87  86 84  Resp: 18   19  Temp: 97.7 F (36.5 C)   97.9 F (36.6 C)  TempSrc: Oral     SpO2: 100%   100%  Weight:      Height:        Intake/Output Summary (Last 24 hours) at 12/13/2019 1240 Last data filed at 12/13/2019 8119 Gross per 24 hour  Intake 504.22 ml  Output 1600 ml  Net -1095.78 ml   Filed Weights   12/04/19 1453 12/11/19 0843  Weight: 122 kg 122 kg    Physical Examination: General: Moderately built, no acute distress noted Head ENT: Atraumatic normocephalic, PERRLA, neck supple Heart: S1-S2 heard, regular rate and rhythm, no murmurs.  No leg edema noted Lungs: Equal air entry bilaterally, no rhonchi or rales on exam, no accessory muscle use Abdomen: Bowel sounds heard, soft, nontender, nondistended. No organomegaly.  No CVA tenderness Extremities: S/p right AKA.  No pedal edema.  No cyanosis or clubbing. Neurological: Awake alert oriented x3, no focal weakness or numbness, strength and sensations to crude touch intact Skin: Packing/dressing  along incision and drainage sites on right side of his back/left posterior thigh.  Does have a palpable nodule, mobile, along right scrotum, nontender and no signs of acute infection.   Data Reviewed: I have personally reviewed following labs and imaging studies  CBC: Recent Labs  Lab 12/07/19 0916 12/08/19 0145 12/09/19 0158 12/10/19 0131 12/13/19 0644  WBC 13.2* 12.0* 10.2 11.4* 9.0  HGB 10.4* 10.7* 10.5* 10.3* 10.0*  HCT 32.4* 33.6* 33.3* 32.4* 31.8*  MCV 81.6 82.2 83.0 82.0 82.6  PLT 186 215 212 205 221   Basic Metabolic Panel: Recent Labs  Lab 12/07/19 0235 12/08/19 0145 12/09/19 0158 12/10/19 0131 12/13/19 0644  NA 135 136 139 139 137  K 3.3* 3.3* 3.8 4.1 3.9  CL 103 104 107 106 104  CO2 24 23 21* 22 26  GLUCOSE 199* 141* 122* 157* 143*  BUN CREATININE 0.94 0.99 0.90 1.02 0.98  CALCIUM 9.1 9.2 9.0 9.0 8.7*   GFR: Estimated Creatinine Clearance: 112.6 mL/min (by C-G formula based on SCr of 0.98 mg/dL). Liver Function Tests: No results for input(s): AST, ALT, ALKPHOS, BILITOT, PROT, ALBUMIN in the last 168 hours. No results for input(s): LIPASE, AMYLASE in the last 168 hours. No results for input(s): AMMONIA in the last 168 hours. Coagulation Profile: No results for input(s): INR, PROTIME in the last 168 hours. Cardiac Enzymes: No results for input(s): CKTOTAL, CKMB, CKMBINDEX, TROPONINI in the last 168 hours. BNP (last 3 results) No results for input(s):  PROBNP in the last 8760 hours. HbA1C: No results for input(s): HGBA1C in the last 72 hours. CBG: Recent Labs  Lab 12/12/19 1214 12/12/19 1626 12/12/19 2204 12/13/19 0742 12/13/19 1157  GLUCAP 281* 127* 186* 161* 228*   Lipid Profile: No results for input(s): CHOL, HDL, LDLCALC, TRIG, CHOLHDL, LDLDIRECT in the last 72 hours. Thyroid Function Tests: No results for input(s): TSH, T4TOTAL, FREET4, T3FREE, THYROIDAB in the last 72 hours. Anemia Panel: No results for input(s):  VITAMINB12, FOLATE, FERRITIN, TIBC, IRON, RETICCTPCT in the last 72 hours. Sepsis Labs: Recent Labs  Lab 12/07/19 0916 12/08/19 0145 12/09/19 0158  PROCALCITON 0.52 0.44 0.18    Recent Results (from the past 240 hour(s))  Respiratory Panel by RT PCR (Flu A&B, Covid) - Nasopharyngeal Swab     Status: None   Collection Time: 12/04/19  3:24 PM   Specimen: Nasopharyngeal Swab  Result Value Ref Range Status   SARS Coronavirus 2 by RT PCR NEGATIVE NEGATIVE Final    Comment: (NOTE) SARS-CoV-2 target nucleic acids are NOT DETECTED.  The SARS-CoV-2 RNA is generally detectable in upper respiratoy specimens during the acute phase of infection. The lowest concentration of SARS-CoV-2 viral copies this assay can detect is 131 copies/mL. A negative result does not preclude SARS-Cov-2 infection and should not be used as the sole basis for treatment or other patient management decisions. A negative result may occur with  improper specimen collection/handling, submission of specimen other than nasopharyngeal swab, presence of viral mutation(s) within the areas targeted by this assay, and inadequate number of viral copies (<131 copies/mL). A negative result must be combined with clinical observations, patient history, and epidemiological information. The expected result is Negative.  Fact Sheet for Patients:  https://www.moore.com/  Fact Sheet for Healthcare Providers:  https://www.young.biz/  This test is no t yet approved or cleared by the [place] FDA and  has been authorized for detection and/or diagnosis of SARS-CoV-2 by FDA under an Emergency Use Authorization (EUA). This EUA will remain  in effect (meaning this test can be used) for the duration of the COVID-19 declaration under Section 564(b)(1) of the Act, 21 U.S.C. section 360bbb-3(b)(1), unless the authorization is terminated or revoked sooner.     Influenza A by PCR NEGATIVE NEGATIVE  Final   Influenza B by PCR NEGATIVE NEGATIVE Final    Comment: (NOTE) The Xpert Xpress SARS-CoV-2/FLU/RSV assay is intended as an aid in  the diagnosis of influenza from Nasopharyngeal swab specimens and  should not be used as a sole basis for treatment. Nasal washings and  aspirates are unacceptable for Xpert Xpress SARS-CoV-2/FLU/RSV  testing.  Fact Sheet for Patients: https://www.moore.com/  Fact Sheet for Healthcare Providers: https://www.young.biz/  This test is not yet approved or cleared by the [place] FDA and  has been authorized for detection and/or diagnosis of SARS-CoV-2 by  FDA under an Emergency Use Authorization (EUA). This EUA will remain  in effect (meaning this test can be used) for the duration of the  Covid-19 declaration under Section 564(b)(1) of the Act, 21  U.S.C. section 360bbb-3(b)(1), unless the authorization is  terminated or revoked. Performed at [facility], [address] [place], [place] [address]   Culture, blood (Routine x 2)     Status: None   Collection Time: 12/04/19  3:40 PM   Specimen: BLOOD LEFT WRIST  Result Value Ref Range Status   Specimen Description BLOOD LEFT WRIST  Final   Special Requests   Final  BOTTLES DRAWN AEROBIC ONLY Blood Culture results may not be optimal due to an inadequate volume of blood received in culture bottles   Culture   Final    NO GROWTH 5 DAYS Performed at [facility], [address], [place], [place] [address]    Report Status 12/09/2019 FINAL  Final  Culture, blood (Routine x 2)     Status: None   Collection Time: 12/04/19  3:44 PM   Specimen: BLOOD LEFT HAND  Result Value Ref Range Status   Specimen Description BLOOD LEFT HAND  Final   Special Requests   Final    BOTTLES DRAWN AEROBIC AND ANAEROBIC Blood Culture adequate volume   Culture   Final    NO GROWTH 5 DAYS Performed at [facility], [address], [place], [place]  [address]    Report Status 12/09/2019 FINAL  Final  Urine culture     Status: Abnormal   Collection Time: 12/04/19  4:01 PM   Specimen: In/Out Cath Urine  Result Value Ref Range Status   Specimen Description IN/OUT CATH URINE  Final   Special Requests   Final    NONE Performed at [facility], [address], [place], [place] [address]    Culture (A)  Final    2,000 COLONIES/mL METHICILLIN RESISTANT STAPHYLOCOCCUS AUREUS   Report Status 12/06/2019 FINAL  Final   Organism ID, Bacteria METHICILLIN RESISTANT STAPHYLOCOCCUS AUREUS (A)  Final      Susceptibility   Methicillin resistant staphylococcus aureus - MIC*    CIPROFLOXACIN >=8 RESISTANT Resistant     GENTAMICIN <=0.5 SENSITIVE Sensitive     NITROFURANTOIN <=16 SENSITIVE Sensitive     OXACILLIN >=4 RESISTANT Resistant     TETRACYCLINE <=1 SENSITIVE Sensitive     VANCOMYCIN 1 SENSITIVE Sensitive     TRIMETH/SULFA <=10 SENSITIVE Sensitive     CLINDAMYCIN <=0.25 SENSITIVE Sensitive     RIFAMPIN <=0.5 SENSITIVE Sensitive     Inducible Clindamycin NEGATIVE Sensitive     * 2,000 COLONIES/mL METHICILLIN RESISTANT STAPHYLOCOCCUS AUREUS  MRSA PCR Screening     Status: Abnormal   Collection Time: 12/09/19 12:19 PM   Specimen: Nasal Mucosa; Nasopharyngeal  Result Value Ref Range Status   MRSA by PCR POSITIVE (A) NEGATIVE Final    Comment:        The GeneXpert MRSA Assay (FDA approved for NASAL specimens only), is one component of a comprehensive MRSA colonization surveillance program. It is not intended to diagnose MRSA infection nor to guide or monitor treatment for MRSA infections. CRITICAL RESULT CALLED TO, READ BACK BY AND VERIFIED WITH: RN [facility] COOPER AT 1416 12/09/19 BY MM. Performed at [facility], [address], [place], [place] [address]   Aerobic/Anaerobic Culture (surgical/deep wound)     Status: None (Preliminary result)   Collection Time: 12/11/19  9:57 AM   Specimen: PATH Other; Body Fluid  Result  Value Ref Range Status   Specimen Description ABSCESS  Final   Special Requests A BACK ABS  Final   Gram Stain   Final    MODERATE WBC PRESENT,BOTH PMN AND MONONUCLEAR RARE GRAM POSITIVE COCCI Performed at [facility], [address] [place], [place] [address]    Culture FEW METHICILLIN RESISTANT STAPHYLOCOCCUS AUREUS  Final   Report Status PENDING  Incomplete   Organism ID, Bacteria METHICILLIN RESISTANT STAPHYLOCOCCUS AUREUS  Final      Susceptibility   Methicillin resistant staphylococcus aureus - MIC*  CIPROFLOXACIN >=8 RESISTANT Resistant     ERYTHROMYCIN >=8 RESISTANT Resistant     GENTAMICIN <=0.5 SENSITIVE Sensitive     OXACILLIN >=4 RESISTANT Resistant     TETRACYCLINE <=1 SENSITIVE Sensitive     VANCOMYCIN 1 SENSITIVE Sensitive     TRIMETH/SULFA <=10 SENSITIVE Sensitive     CLINDAMYCIN <=0.25 SENSITIVE Sensitive     RIFAMPIN <=0.5 SENSITIVE Sensitive     Inducible Clindamycin NEGATIVE Sensitive     * FEW METHICILLIN RESISTANT STAPHYLOCOCCUS AUREUS      Radiology Studies: US EKG SITE RITE  Result Date: 12/12/2019 If Site Rite image not attached, placement could not be confirmed due to current cardiac rhythm.     Scheduled Meds: . atorvastatin  80 mg Oral Q2000  . buPROPion  300 mg Oral Daily  . busPIRone  15 mg Oral TID  . carvedilol  12.5 mg Oral BID WC  . Chlorhexidine Gluconate Cloth  6 each Topical Q0600  . heparin  5,000 Units Subcutaneous Q8H  . hydrocerin   Topical BID  . insulin aspart  0-15 Units Subcutaneous TID WC  . insulin aspart  0-5 Units Subcutaneous QHS  . insulin aspart  12 Units Subcutaneous TID WC  . insulin glargine  30 Units Subcutaneous Daily  . lisinopril  20 mg Oral Daily  . mupirocin ointment   Nasal BID  . prazosin  2 mg Oral QHS   Continuous Infusions: . lactated ringers 10 mL/hr at 12/11/19 1221  . vancomycin 1,000 mg (12/13/19 [id number])     Assessment/Plan:  Insulin-dependent DM with DKA POA:In the setting of  cellulitis and not taking home insulin for few days.DKA resolved with insulin gtt. Transitioned back to  lantus plus novolog.  Patient states his blood sugar is usually well controlled on home regimen and feels hyperglycemia on presentation was related to acute infection.  Hemoglobin A1c 10.3  Cellulitis/abscess-IV abx/vanco per ID-went to the OR on 11/19 for I/Dfor 2 areas-wound care with packing per general surgery-op cultures indicate MRSA with gm +cocci in clusters. Blood cx -ve. Most likely can go home with po doxycycline x10 days when final culture identification/sensitivities available and cleared by ID/general surgery.   Will need home health set up if twice daily dressing changes to be continued on discharge (RN to check with surgical team), if not patient possibly can drive to wound care clinic for less frequent changes.  Given recurrent skin infections, will benefit from Hibiclens baths and MRSA decolonization protocol upon discharge.  AKI:In setting of DKA. Continue management as above and follow labs.  Hypertension:Continue Coreg. Hold lisinopril with AKI.  Hyperlipidemia:Continue atorvastatin.  Depression/anxiety/PTSD:Continue Wellbutrin, BuSpar, Atarax, prazosin  DVT prophylaxis: Heparin Code Status: Full code Family / Patient Communication: Discussed with patient and bedside nurse Disposition Plan: Home when medically cleared Status is: Inpatient  Remains inpatient appropriate because:Continued IV pain medication requirement and twice daily dressing changes   Dispo: The patient is from: Home              Anticipated d/c is to: Home              Anticipated d/c date is: 1-2 days              Patient currently is not medically stable to d/c.           Time spent: 35 min   >50% time spent in discussions with care team and coordination of care.    Alessandra BevelsNeelima Hoke Baer, MD [facility] Pager  in Amion  If 7PM-7AM, please contact  night-coverage [email or web address] 12/13/2019, 12:40 PM

## 2019-12-13 NOTE — Progress Notes (Signed)
    [facility]   Reason for visit: Follow up on abscess  Interval History: MRSA growth in culture  Physical Exam: Constitutional:  Vitals:   12/13/19 0848 12/13/19 1159  BP: (!) 133/58 (!) 151/70  Pulse: 86 84  Resp:  19  Temp:  97.9 F (36.6 C)  SpO2:  100%   patient appears in NAD  Impression: MRSA infection. Doxycycline sensitive.    Plan: 1.  10 days of doxycycline [facility]   I will sign off

## 2019-12-14 ENCOUNTER — Encounter (HOSPITAL_COMMUNITY): Payer: Self-pay | Admitting: General Surgery

## 2019-12-14 ENCOUNTER — Ambulatory Visit (INDEPENDENT_AMBULATORY_CARE_PROVIDER_SITE_OTHER): Payer: No Typology Code available for payment source | Admitting: Licensed Clinical Social Worker

## 2019-12-14 DIAGNOSIS — L03312 Cellulitis of back [any part except buttock]: Secondary | ICD-10-CM | POA: Diagnosis not present

## 2019-12-14 DIAGNOSIS — F4310 Post-traumatic stress disorder, unspecified: Secondary | ICD-10-CM

## 2019-12-14 DIAGNOSIS — N179 Acute kidney failure, unspecified: Secondary | ICD-10-CM | POA: Diagnosis not present

## 2019-12-14 DIAGNOSIS — E1110 Type 2 diabetes mellitus with ketoacidosis without coma: Secondary | ICD-10-CM | POA: Diagnosis not present

## 2019-12-14 DIAGNOSIS — F331 Major depressive disorder, recurrent, moderate: Secondary | ICD-10-CM

## 2019-12-14 DIAGNOSIS — L0291 Cutaneous abscess, unspecified: Secondary | ICD-10-CM | POA: Diagnosis not present

## 2019-12-14 DIAGNOSIS — F419 Anxiety disorder, unspecified: Secondary | ICD-10-CM

## 2019-12-14 LAB — GLUCOSE, CAPILLARY
Glucose-Capillary: 204 mg/dL — ABNORMAL HIGH (ref 70–99)
Glucose-Capillary: 203 mg/dL — ABNORMAL HIGH (ref 70–99)
Glucose-Capillary: 129 mg/dL — ABNORMAL HIGH (ref 70–99)
Glucose-Capillary: 271 mg/dL — ABNORMAL HIGH (ref 70–99)

## 2019-12-14 MED ORDER — OXYCODONE-ACETAMINOPHEN 5-325 MG PO TABS
1.0000 | ORAL_TABLET | Freq: Four times a day (QID) | ORAL | 0 refills | Status: DC | PRN
Start: 2019-12-14 — End: 2019-12-22

## 2019-12-14 MED ORDER — MUPIROCIN 2 % EX OINT
TOPICAL_OINTMENT | Freq: Two times a day (BID) | CUTANEOUS | 0 refills | Status: DC
Start: 2019-12-14 — End: 2019-12-22

## 2019-12-14 MED ORDER — HIBICLENS 4 % EX LIQD: Freq: Every day | CUTANEOUS | 0 refills | Status: DC | PRN

## 2019-12-14 MED ORDER — OXYCODONE-ACETAMINOPHEN 5-325 MG PO TABS
1.0000 | ORAL_TABLET | ORAL | 0 refills | Status: DC | PRN
Start: 2019-12-14 — End: 2019-12-14

## 2019-12-14 MED ORDER — DOXYCYCLINE MONOHYDRATE 100 MG PO CAPS: 100.0000 mg | ORAL_CAPSULE | Freq: Two times a day (BID) | ORAL | 0 refills | Status: DC

## 2019-12-14 NOTE — Progress Notes (Addendum)
Virtual Visit via Video Note  I connected with Mark [name] on 12/14/19 at 5:00 pm EST by a video enabled telemedicine application and verified that I am speaking with the correct person using two identifiers.   I discussed the limitations of evaluation and management by telemedicine and the availability of in person appointments. The patient expressed understanding and agreed to proceed.  LOCATION: Patient: Hospital Provider: home Office  History of Present Illness: [facility], depression, anxiety.    Observation/Objective:  Patient is currently in the hosptial for Medical stabilization: MRSA, Diabetes, skin wounds, cellulitis, but joined the group session.    Patient participated in a  discussion on Thanksgiving  plans, gratitude and family.  Patient shared gratitudes,  Thanksgiving plans and  possible family scenarios.  Patient was encouraged by the  group to continue identifying  gratitudes, and coping skills for  possible family scenarios.     Assessment and Plan: Counselor will continue to meet with patient address treatment plan goals. Patient recommendations of providers and implement skill.   Follow Up Instructions: I discussed the assessment and treatment plan with the patient. The patient was provided an opportunity to ask questions and all were answered. The patient agreed with the plan and demonstrated an understanding of the instructions.   The patient was advised to call back or seek an in-person evaluation if the symptoms worsen or if the condition fails to improve as anticipated.  I provided 120 minutes of non-face-to-face time during this encounter.   Genell Thede S, LCAS

## 2019-12-14 NOTE — TOC Initial Note (Addendum)
Transition of Care [facility]) - Initial/Assessment Note    Patient Details  Name: Mark [name] MRN: [id number] Date of Birth: 1960/06/04  Transition of Care [facility]) CM/SW Contact:    Beckie Busing, RN Phone Number: [phone]  12/14/2019, 1:59 PM  Clinical Narrative:                 [facility] consulted for patient discharging home with home health needs for dressing changes. Patient is currently active with [facility]. CM called [facility] to determine if patient would be able to go home and receive daily dressing changed per [facility]. Pearson Grippe with [facility] said the most that [facility] would be able to offer is 3 times per week dressing changed. Patient states that he would be able to drive himself to wound center for daily dressing changes. CM called the wound center and the wound center states that they are able to do daily dressing changes but they do not have an opening untill 12/28. Dr. Lajuana Ripple has been made aware and advises that CM start to look for short term rehab. CM spoke with Mallie Darting Sw from the [place]. Jasmine states that patients [facility] Dr. Lenise Arena suggest that if the patient needs daily dressing changes that he may not be ready to discharge home but would be more suitable for short term rehab. Jasmine reports that the process for short term rehab would require [facility] packet/checklist to be completed and information would need to be faxed for approval. The Sw will email necessary forms to this Clinical research associate for completion and fax. Once approval is granted the patient will be able to discharge to short term rehab through [place] benefits. Dr. Lajuana Ripple and patients nurse have been update. CM will initiate checklist once received. TOC will continue to follow.  Expected Discharge Plan: Skilled Nursing Facility (short term rehab) Barriers to Discharge: Other (comment) (Patient requiring daily dressing changes. Home health/ wound center can not provide)   Patient Goals and CMS Choice Patient states their goals for this  hospitalization and ongoing recovery are:: Patient states that he would like to go home but he understands that he cant manage his dressing changes from home.      Expected Discharge Plan and Services Expected Discharge Plan: Skilled Nursing Facility (short term rehab) In-house Referral: NA Discharge Planning Services: CM Consult   Living arrangements for the past 2 months: Single Family Home Expected Discharge Date: 12/14/19               DME Arranged: N/A DME Agency: NA       HH Arranged: NA (Patient is active with [facility] but unable to set up [facility] due to need for short term rehab) HH Agency: NA (Patient active with [facility] prior to admission)        Prior Living Arrangements/Services Living arrangements for the past 2 months: Single Family Home Lives with:: Self Patient language and need for interpreter reviewed:: Yes Do you feel safe going back to the place where you live?: Yes      Need for Family Participation in Patient Care: Yes (Comment) Care giver support system in place?: Yes (comment) Current home services:  (n/a) Criminal Activity/Legal Involvement Pertinent to Current Situation/Hospitalization: No - Comment as needed  Activities of Daily Living Home Assistive Devices/Equipment: Dan Humphreys (specify type) ADL Screening (condition at time of admission) Patient's cognitive ability adequate to safely complete daily activities?: Yes Is the patient deaf or have difficulty hearing?: No Does the patient have difficulty seeing, even when wearing  glasses/contacts?: No Does the patient have difficulty concentrating, remembering, or making decisions?: No Patient able to express need for assistance with ADLs?: Yes Does the patient have difficulty dressing or bathing?: No Independently performs ADLs?: Yes (appropriate for developmental age) Does the patient have difficulty walking or climbing stairs?: Yes Weakness of Legs: Right (aka) Weakness of Arms/Hands: None  Permission  Sought/Granted   Permission granted to share information with : No              Emotional Assessment Appearance:: Appears stated age Attitude/Demeanor/Rapport: Gracious, Engaged Affect (typically observed): Accepting Orientation: : Oriented to Self, Oriented to Place, Oriented to  Time, Oriented to Situation Alcohol / Substance Use: Not Applicable Psych Involvement: No (comment)  Admission diagnosis:  Shortness of breath [R06.02] Hyperkalemia [E87.5] Tachycardia [R00.0] Cellulitis of lower back [L03.312] Cellulitis of left lower extremity [L03.116] Long QT interval [R94.31] Diabetic ketoacidosis without coma associated with type 2 diabetes mellitus (HCC) [E11.10] Diabetic ketoacidosis associated with type 2 diabetes mellitus (HCC) [E11.10] Sepsis with acute renal failure without septic shock, due to unspecified organism, unspecified acute renal failure type (HCC) [A41.9, R65.20, N17.9] Patient Active Problem List   Diagnosis Date Noted  . Abscess   . Pressure injury of skin 12/08/2019  . Diabetic ketoacidosis associated with type 2 diabetes mellitus (HCC) 12/04/2019  . AKI (acute kidney injury) (HCC) 12/04/2019  . Depression with anxiety 12/04/2019  . Hypertension associated with diabetes (HCC) 12/04/2019  . Hyperlipidemia associated with type 2 diabetes mellitus (HCC) 12/04/2019  . Cellulitis of left lower extremity 12/04/2019  . Cellulitis of lower back 12/04/2019  . PTSD (post-traumatic stress disorder) 04/15/2017  . Major depressive disorder, recurrent episode, moderate (HCC) 04/30/2016  . Generalized anxiety disorder 04/30/2016   PCP:  [name] Pharmacy:   [name] [place], [place] - [address] [phone] [facility] [place] [address] Phone: [phone] Fax: [phone]     Social Determinants of Health (SDOH) Interventions    Readmission Risk Interventions No flowsheet data found.

## 2019-12-14 NOTE — Progress Notes (Signed)
Progress Note  3 Days Post-Op  Subjective: Patient reports burning pain in back wound and thigh wound. States back wound feels more severe. Sitting up in bed and eating breakfast. Reports he would like one more dose of dilaudid. Tolerating dressing change well. Reports he lives alone and was trying to contact a friend to see if she could help with dressing changes.   RN informed me patient is being discharged by primary service.   Objective: Vital signs in last 24 hours: Temp:  [97.6 F (36.4 C)-97.9 F (36.6 C)] 97.6 F (36.4 C) (11/21 2343) Pulse Rate:  [84-88] 88 (11/21 2103) Resp:  [19-20] 20 (11/21 2103) BP: (151-153)/(70-74) 153/74 (11/21 2103) SpO2:  [100 %] 100 % (11/21 2103) Last BM Date: 12/10/19  Intake/Output from previous day: 11/21 0701 - 11/22 0700 In: 481.6 [P.O.:480; IV Piggyback:1.6] Out: 800 [Urine:800] Intake/Output this shift: No intake/output data recorded.  PE: LLE: upper posterior left thigh wound open, periwound looks good without erythema or induration, no purulent drainage, some fibrinous exudate at base of wound Back: right lower back wound open, periwound looks good without erythema or induration, no purulent drainage, some fibrinous exudate at base of wound  Lab Results:  Recent Labs    12/13/19 0644  WBC 9.0  HGB 10.0*  HCT 31.8*  PLT 221   BMET Recent Labs    12/13/19 0644  NA 137  K 3.9  CL 104  CO2 26  GLUCOSE 143*  BUN 12  CREATININE 0.98  CALCIUM 8.7*   PT/INR No results for input(s): LABPROT, INR in the last 72 hours. CMP     Component Value Date/Time   NA 137 12/13/2019 0644   K 3.9 12/13/2019 0644   CL 104 12/13/2019 0644   CO2 26 12/13/2019 0644   GLUCOSE 143 (H) 12/13/2019 0644   BUN 12 12/13/2019 0644   CREATININE 0.98 12/13/2019 0644   CALCIUM 8.7 (L) 12/13/2019 0644   PROT 8.5 (H) 12/04/2019 1540   ALBUMIN 3.4 (L) 12/04/2019 1540   AST 8 (L) 12/04/2019 1540   ALT 10 12/04/2019 1540   ALKPHOS 125  12/04/2019 1540   BILITOT 1.8 (H) 12/04/2019 1540   GFRNONAA >60 12/13/2019 0644   GFRAA >60 12/26/2017 2204   Lipase     Component Value Date/Time   LIPASE 159 (H) 12/26/2017 2204       Studies/Results: [name] EKG SITE RITE  Result Date: 12/12/2019 If Site Rite image not attached, placement could not be confirmed due to current cardiac rhythm.   Anti-infectives: Anti-infectives (From admission, onward)   Start     Dose/Rate Route Frequency Ordered Stop   12/14/19 0000  doxycycline (MONODOX) 100 MG capsule        100 mg Oral 2 times daily 12/14/19 0829 12/24/19 2359   12/12/19 1715  vancomycin (VANCOCIN) IVPB 1000 mg/200 mL premix        1,000 mg 200 mL/hr over 60 Minutes Intravenous Every 12 hours 12/12/19 1055     12/08/19 2200  ceFEPIme (MAXIPIME) 2 g in sodium chloride 0.9 % 100 mL IVPB  Status:  Discontinued        2 g 200 mL/hr over 30 Minutes Intravenous Every 8 hours 12/08/19 1149 12/12/19 1108   12/08/19 2000  vancomycin (VANCOREADY) IVPB 1250 mg/250 mL  Status:  Discontinued        1,250 mg 166.7 mL/hr over 90 Minutes Intravenous Every 12 hours 12/08/19 1149 12/12/19 1055   12/08/19 1400  metroNIDAZOLE (FLAGYL) tablet 500 mg  Status:  Discontinued        500 mg Oral Every 8 hours 12/08/19 1149 12/11/19 1059   12/07/19 2200  doxycycline (VIBRA-TABS) tablet 100 mg  Status:  Discontinued        100 mg Oral Every 12 hours 12/07/19 1827 12/08/19 1149   12/06/19 1700  cefTRIAXone (ROCEPHIN) 2 g in sodium chloride 0.9 % 100 mL IVPB  Status:  Discontinued        2 g 200 mL/hr over 30 Minutes Intravenous Every 24 hours 12/06/19 0917 12/08/19 1149   12/06/19 1400  vancomycin (VANCOCIN) IVPB 1000 mg/200 mL premix  Status:  Discontinued        1,000 mg 200 mL/hr over 60 Minutes Intravenous Every 8 hours 12/06/19 0915 12/07/19 1827   12/05/19 1700  cefTRIAXone (ROCEPHIN) 2 g in sodium chloride 0.9 % 100 mL IVPB  Status:  Discontinued        2 g 200 mL/hr over 30 Minutes  Intravenous Every 24 hours 12/04/19 2030 12/06/19 0915   12/05/19 1600  vancomycin (VANCOREADY) IVPB 1500 mg/300 mL  Status:  Discontinued        1,500 mg 150 mL/hr over 120 Minutes Intravenous Every 24 hours 12/04/19 1750 12/04/19 2029   12/04/19 1515  vancomycin (VANCOCIN) IVPB 1000 mg/200 mL premix  Status:  Discontinued        1,000 mg 200 mL/hr over 60 Minutes Intravenous  Once 12/04/19 1510 12/04/19 1512   12/04/19 1515  cefTRIAXone (ROCEPHIN) 2 g in sodium chloride 0.9 % 100 mL IVPB        2 g 200 mL/hr over 30 Minutes Intravenous  Once 12/04/19 1510 12/04/19 1758   12/04/19 1515  vancomycin (VANCOREADY) IVPB 2000 mg/400 mL        2,000 mg 200 mL/hr over 120 Minutes Intravenous  Once 12/04/19 1512 12/04/19 1846       Assessment/Plan Hypertension Diabetes, with DKA-treatment per medicine.  History of MRSA  Right back abscess, and left posterior thigh abscess S/p I&D 11/19 Dr. Derrell Lolling - POD#3 - culture with MRSA, discharging on 10 days PO doxy - BID or daily wet to dry dressing changes. Ok to shower with wounds open.   Patient lives alone, not sure who is going to help him with dressing changes but he is going to think about it. [facility] [place] and [facility] stayed at their [facility]F; if unable to find wound care help at home he may benefit from ALF.  ID - maxipime 11/16>11/20, vancomycin 11/16>> FEN - CM diet VTE - SCDs, sq heparin Foley - none Follow up - [facility]  LOS: 10 days    Juliet Rude , [facility] 12/14/2019, 8:56 AM Please see Amion for pager number during day hours 7:00am-4:30pm

## 2019-12-14 NOTE — Discharge Summary (Addendum)
Physician Discharge Summary  Mark [name] QBH:[id number] DOB: 24-Oct-1960 DOA: 12/04/2019  PCP: [name]  Admit date: 12/04/2019 Discharge date: 12/14/2019 Consultations: Gen surgery, ID Admitted From: home Disposition: home  Discharge Diagnoses:  Principal Problem:   Diabetic ketoacidosis associated with type 2 diabetes mellitus (HCC) Active Problems:   AKI (acute kidney injury) (HCC)   Depression with anxiety   Hypertension associated with diabetes (HCC)   Hyperlipidemia associated with type 2 diabetes mellitus (HCC)   Cellulitis of left lower extremity   Cellulitis of lower back   Pressure injury of skin   Abscess   Hospital Course Summary: 59 y.o.malewith medical history significant forinsulin-dependent type 2 diabetes, hypertension, hyperlipidemia, right AKA, depression, anxiety, PTSD whois admitted with DKA/skin abscesses withcellulitis. Slow to improve,so ID consulted as well as GS for I&D in the OR.  Insulin-dependent DM with DKA POA:In the setting of cellulitis and not taking home insulinforfew days.DKA resolved with insulin gtt. Transitioned back tolantus plus novolog.  Patient states his blood sugar is usually well controlled on home regimen and feels hyperglycemia on presentation was related to acute infection.  Hemoglobin A1c 10.3. Resume Lantus and Pioglitazone.  Cellulitis/abscess-IV abx/vancoper ID-went to the OR on 11/19 for I/Dfor 2 areas-wound care with packing per general surgery-op culturesindicate MRSA with gm +cocci in clusters. Blood cx -ve. Per ID, can go home with po doxycycline x10 days .Per GS can switch to daily dressing changes-HH vs  patient possibly can drive to wound care clinic.  Given recurrent skin infections, will benefit from Hibiclens baths and MRSA decolonization protocol upon discharge.  Acute on chronic pain: Noted to be on oxycodone scripts periodically. Per [place] opiate  database review , last prescription was on  11/06/19 for 30 days and #45 pills. Patient has deep wounds currently and has required IV pain meds here. Renewed/issued script for 5 days supply for acute pain. He was advised to d/w PCP regarding further refills per opiate agreement in clinic.   AKI:In setting of DKA. Resolved with IV fluids. Resumed on ACEI.    Hypertension:Continue Coreg and lisinopril.  Hyperlipidemia:Continue atorvastatin.  Depression/anxiety/PTSD:Continue Wellbutrin, BuSpar, Atarax, prazosin  Scrotal swelling: Patient reports noting a small nodule in the right side of scrotum while taking shower, not painful. Mobile, non tender. Monitor response to abx, recommend scrotal USG as outpatient if swelling persists.      Discharge Exam:   Vitals:   12/13/19 0848 12/13/19 1159 12/13/19 2103 12/13/19 2343  BP: (!) 133/58 (!) 151/70 (!) 153/74   Pulse: 86 84 88   Resp:  19 20   Temp:  97.9 F (36.6 C) 97.6 F (36.4 C) 97.6 F (36.4 C)  TempSrc:   Oral Oral  SpO2:  100% 100%   Weight:      Height:        General: Pt is alert, awake, not in acute distress Cardiovascular: RRR, S1/S2 +, no rubs, no gallops Respiratory: CTA bilaterally, no wheezing, no rhonchi Abdominal: Soft, NT, ND, bowel sounds + Extremities: S/p right AKA.  No pedal edema.  No cyanosis or clubbing. Neurological: Awake alert oriented x3, no focal weakness or numbness, strength and sensations to crude touch intact Skin: Packing/dressing along incision and drainage sites on right side of his back/left posterior thigh.  Does have a palpable nodule, mobile, along right scrotum, nontender and no signs of acute infection.  Discharge Condition:Stable CODE STATUS: Full code Diet recommendation: low salt, diabetic diet Recommendations for Outpatient Follow-up:  1. Follow up with  PCP: 5 days 2. Follow up with consultants: GS clinic in 1 week 3. Please obtain follow up labs including: CBC/BMP  Home Health services upon discharge: Possibly if  going home Equipment/Devices upon discharge:   Discharge Instructions:  Discharge Instructions    Call MD for:  difficulty breathing, headache or visual disturbances   Complete by: As directed    Call MD for:  persistant dizziness or light-headedness   Complete by: As directed    Call MD for:  persistant nausea and vomiting   Complete by: As directed    Call MD for:  redness, tenderness, or signs of infection (pain, swelling, redness, odor or green/yellow discharge around incision site)   Complete by: As directed    Call MD for:  severe uncontrolled pain   Complete by: As directed    Call MD for:  temperature >100.4   Complete by: As directed    Diet - low sodium heart healthy   Complete by: As directed    Diet Carb Modified   Complete by: As directed    Discharge wound care:   Complete by: As directed    Wet to dry dressing as advised through wound care clinic   Increase activity slowly   Complete by: As directed    Leave dressing on - Keep it clean, dry, and intact until clinic visit   Complete by: As directed      Allergies as of 12/14/2019      Reactions   Ozempic (0.25 Or 0.5 Mg-dose) [semaglutide(0.25 Or 0.5mg -dos)] Other (See Comments)   Caused Pancreatitis   Semaglutide Other (See Comments)   Caused pancreatitis   Lyrica [pregabalin] Rash      Medication List    TAKE these medications   atorvastatin 80 MG tablet Commonly known as: LIPITOR Take 80 mg by mouth daily.   buPROPion 300 MG 24 hr tablet Commonly known as: WELLBUTRIN XL Take 1 tablet (300 mg total) by mouth daily.   busPIRone 15 MG tablet Commonly known as: BUSPAR Take 1 tablet (15 mg total) by mouth 3 (three) times daily.   carvedilol 12.5 MG tablet Commonly known as: COREG Take 12.5 mg by mouth 2 (two) times daily with a meal.   doxycycline 100 MG capsule Commonly known as: MONODOX Take 1 capsule (100 mg total) by mouth 2 (two) times daily for 10 days.   escitalopram 20 MG  tablet Commonly known as: LEXAPRO Take 20 mg by mouth daily.   Hibiclens 4 % external liquid Generic drug: chlorhexidine Apply topically daily as needed.   hydrOXYzine 25 MG capsule Commonly known as: VISTARIL Take 25-50 mg by mouth 2 (two) times daily as needed for anxiety.   insulin aspart 100 UNIT/ML injection Commonly known as: novoLOG Inject 15 Units into the skin 3 (three) times daily before meals.   LANTUS [place] Inject 25-50 Units into the skin See admin instructions. Taking 50 units in the Am and 25 units at bedtime   lisinopril 20 MG tablet Commonly known as: ZESTRIL Take 20 mg by mouth daily.   mupirocin ointment 2 % Commonly known as: BACTROBAN Place into the nose 2 (two) times daily.   oxyCODONE-acetaminophen 5-325 MG tablet Commonly known as: Percocet Take 1-2 tablets by mouth every 4 (four) hours as needed for up to 5 days for moderate pain or severe pain. What changed: reasons to take this   pioglitazone 15 MG tablet Commonly known as: ACTOS Take 15 mg by mouth daily.   prazosin  2 MG capsule Commonly known as: MINIPRESS Take 1 capsule (2 mg total) by mouth at bedtime.   sildenafil 100 MG tablet Commonly known as: VIAGRA Take 100 mg by mouth daily as needed for erectile dysfunction.   traZODone 50 MG tablet Commonly known as: DESYREL Take 1 tablet (50 mg total) by mouth at bedtime as needed. What changed: reasons to take this            Discharge Care Instructions  (From admission, onward)         Start     Ordered   12/14/19 0000  Discharge wound care:       Comments: Wet to dry dressing as advised through wound care clinic   12/14/19 0829   12/14/19 0000  Leave dressing on - Keep it clean, dry, and intact until clinic visit        12/14/19 0829          Follow-up Information    [facility], [facility]. Go on 12/28/2019.   Specialty: General Surgery Why: 130pm. Please arrive 30 minutes prior to the appointment for your appointment.  Please bring a copy of your photo ID and insurance card.  Contact information: [address] [place] [address] [phone]              Allergies  Allergen Reactions  . Ozempic (0.25 Or 0.5 Mg-Dose) [Semaglutide(0.25 Or 0.5mg -Dos)] Other (See Comments)    Caused Pancreatitis  . Semaglutide Other (See Comments)    Caused pancreatitis  . Lyrica [Pregabalin] Rash      The results of significant diagnostics from this hospitalization (including imaging, microbiology, ancillary and laboratory) are listed below for reference.    Labs: BNP (last 3 results) No results for input(s): BNP in the last 8760 hours. Basic Metabolic Panel: Recent Labs  Lab 12/08/19 0145 12/09/19 0158 12/10/19 0131 12/13/19 0644  NA 136 139 139 137  K 3.3* 3.8 4.1 3.9  CL 104 107 106 104  CO2 23 21* 22 26  GLUCOSE 141* 122* 157* 143*  BUN 16 15 15 12   CREATININE 0.99 0.90 1.02 0.98  CALCIUM 9.2 9.0 9.0 8.7*   Liver Function Tests: No results for input(s): AST, ALT, ALKPHOS, BILITOT, PROT, ALBUMIN in the last 168 hours. No results for input(s): LIPASE, AMYLASE in the last 168 hours. No results for input(s): AMMONIA in the last 168 hours. CBC: Recent Labs  Lab 12/07/19 0916 12/08/19 0145 12/09/19 0158 12/10/19 0131 12/13/19 0644  WBC 13.2* 12.0* 10.2 11.4* 9.0  HGB 10.4* 10.7* 10.5* 10.3* 10.0*  HCT 32.4* 33.6* 33.3* 32.4* 31.8*  MCV 81.6 82.2 83.0 82.0 82.6  PLT 186 215 212 205 221   Cardiac Enzymes: No results for input(s): CKTOTAL, CKMB, CKMBINDEX, TROPONINI in the last 168 hours. BNP: Invalid input(s): POCBNP CBG: Recent Labs  Lab 12/13/19 0742 12/13/19 1157 12/13/19 1542 12/13/19 2059 12/14/19 0733  GLUCAP 161* 228* 221* 222* 204*   D-Dimer No results for input(s): DDIMER in the last 72 hours. Hgb A1c No results for input(s): HGBA1C in the last 72 hours. Lipid Profile No results for input(s): CHOL, HDL, LDLCALC, TRIG, CHOLHDL, LDLDIRECT in the last 72  hours. Thyroid function studies No results for input(s): TSH, T4TOTAL, T3FREE, THYROIDAB in the last 72 hours.  Invalid input(s): FREET3 Anemia work up No results for input(s): VITAMINB12, FOLATE, FERRITIN, TIBC, IRON, RETICCTPCT in the last 72 hours. Urinalysis    Component Value Date/Time   COLORURINE STRAW (A)  12/04/2019 1601   APPEARANCEUR CLEAR 12/04/2019 1601   LABSPEC 1.022 12/04/2019 1601   PHURINE 5.0 12/04/2019 1601   GLUCOSEU >=500 (A) 12/04/2019 1601   HGBUR SMALL (A) 12/04/2019 1601   BILIRUBINUR NEGATIVE 12/04/2019 1601   KETONESUR 80 (A) 12/04/2019 1601   PROTEINUR 30 (A) 12/04/2019 1601   NITRITE NEGATIVE 12/04/2019 1601   LEUKOCYTESUR NEGATIVE 12/04/2019 1601   Sepsis Labs Invalid input(s): PROCALCITONIN,  WBC,  LACTICIDVEN Microbiology Recent Results (from the past 240 hour(s))  Respiratory Panel by RT PCR (Flu A&B, Covid) - Nasopharyngeal Swab     Status: None   Collection Time: 12/04/19  3:24 PM   Specimen: Nasopharyngeal Swab  Result Value Ref Range Status   SARS Coronavirus 2 by RT PCR NEGATIVE NEGATIVE Final    Comment: (NOTE) SARS-CoV-2 target nucleic acids are NOT DETECTED.  The SARS-CoV-2 RNA is generally detectable in upper respiratoy specimens during the acute phase of infection. The lowest concentration of SARS-CoV-2 viral copies this assay can detect is 131 copies/mL. A negative result does not preclude SARS-Cov-2 infection and should not be used as the sole basis for treatment or other patient management decisions. A negative result may occur with  improper specimen collection/handling, submission of specimen other than nasopharyngeal swab, presence of viral mutation(s) within the areas targeted by this assay, and inadequate number of viral copies (<131 copies/mL). A negative result must be combined with clinical observations, patient history, and epidemiological information. The expected result is Negative.  Fact Sheet for Patients:   https://www.moore.com/https://www.fda.gov/media/142436/download  Fact Sheet for Healthcare Providers:  https://www.young.biz/https://www.fda.gov/media/142435/download  This test is no t yet approved or cleared by the [place] FDA and  has been authorized for detection and/or diagnosis of SARS-CoV-2 by FDA under an Emergency Use Authorization (EUA). This EUA will remain  in effect (meaning this test can be used) for the duration of the COVID-19 declaration under Section 564(b)(1) of the Act, 21 U.S.C. section 360bbb-3(b)(1), unless the authorization is terminated or revoked sooner.     Influenza A by PCR NEGATIVE NEGATIVE Final   Influenza B by PCR NEGATIVE NEGATIVE Final    Comment: (NOTE) The Xpert Xpress SARS-CoV-2/FLU/RSV assay is intended as an aid in  the diagnosis of influenza from Nasopharyngeal swab specimens and  should not be used as a sole basis for treatment. Nasal washings and  aspirates are unacceptable for Xpert Xpress SARS-CoV-2/FLU/RSV  testing.  Fact Sheet for Patients: https://www.moore.com/https://www.fda.gov/media/142436/download  Fact Sheet for Healthcare Providers: https://www.young.biz/https://www.fda.gov/media/142435/download  This test is not yet approved or cleared by the [place] FDA and  has been authorized for detection and/or diagnosis of SARS-CoV-2 by  FDA under an Emergency Use Authorization (EUA). This EUA will remain  in effect (meaning this test can be used) for the duration of the  Covid-19 declaration under Section 564(b)(1) of the Act, 21  U.S.C. section 360bbb-3(b)(1), unless the authorization is  terminated or revoked. Performed at [facility], [address] [place], [place] [id number]   Culture, blood (Routine x 2)     Status: None   Collection Time: 12/04/19  3:40 PM   Specimen: BLOOD LEFT WRIST  Result Value Ref Range Status   Specimen Description BLOOD LEFT WRIST  Final   Special Requests   Final    BOTTLES DRAWN AEROBIC ONLY Blood Culture results may not be optimal due to an inadequate volume  of blood received in culture bottles   Culture   Final    NO GROWTH 5  DAYS Performed at [facility], [address] [place], [place] [address]    Report Status 12/09/2019 FINAL  Final  Culture, blood (Routine x 2)     Status: None   Collection Time: 12/04/19  3:44 PM   Specimen: BLOOD LEFT HAND  Result Value Ref Range Status   Specimen Description BLOOD LEFT HAND  Final   Special Requests   Final    BOTTLES DRAWN AEROBIC AND ANAEROBIC Blood Culture adequate volume   Culture   Final    NO GROWTH 5 DAYS Performed at [facility], [address], [place], [place] [address]    Report Status 12/09/2019 FINAL  Final  Urine culture     Status: Abnormal   Collection Time: 12/04/19  4:01 PM   Specimen: In/Out Cath Urine  Result Value Ref Range Status   Specimen Description IN/OUT CATH URINE  Final   Special Requests   Final    NONE Performed at [facility], [address], [place], [place] [address]    Culture (A)  Final    2,000 COLONIES/mL METHICILLIN RESISTANT STAPHYLOCOCCUS AUREUS   Report Status 12/06/2019 FINAL  Final   Organism ID, Bacteria METHICILLIN RESISTANT STAPHYLOCOCCUS AUREUS (A)  Final      Susceptibility   Methicillin resistant staphylococcus aureus - MIC*    CIPROFLOXACIN >=8 RESISTANT Resistant     GENTAMICIN <=0.5 SENSITIVE Sensitive     NITROFURANTOIN <=16 SENSITIVE Sensitive     OXACILLIN >=4 RESISTANT Resistant     TETRACYCLINE <=1 SENSITIVE Sensitive     VANCOMYCIN 1 SENSITIVE Sensitive     TRIMETH/SULFA <=10 SENSITIVE Sensitive     CLINDAMYCIN <=0.25 SENSITIVE Sensitive     RIFAMPIN <=0.5 SENSITIVE Sensitive     Inducible Clindamycin NEGATIVE Sensitive     * 2,000 COLONIES/mL METHICILLIN RESISTANT STAPHYLOCOCCUS AUREUS  MRSA PCR Screening     Status: Abnormal   Collection Time: 12/09/19 12:19 PM   Specimen: Nasal Mucosa; Nasopharyngeal  Result Value Ref Range Status   MRSA by PCR POSITIVE (A) NEGATIVE Final    Comment:        The  GeneXpert MRSA Assay (FDA approved for NASAL specimens only), is one component of a comprehensive MRSA colonization surveillance program. It is not intended to diagnose MRSA infection nor to guide or monitor treatment for MRSA infections. CRITICAL RESULT CALLED TO, READ BACK BY AND VERIFIED WITH: RN [facility] COOPER AT 1416 12/09/19 BY MM. Performed at [facility], [address], [place], [place] [address]   Aerobic/Anaerobic Culture (surgical/deep wound)     Status: None (Preliminary result)   Collection Time: 12/11/19  9:57 AM   Specimen: PATH Other; Body Fluid  Result Value Ref Range Status   Specimen Description ABSCESS  Final   Special Requests A BACK ABS  Final   Gram Stain   Final    MODERATE WBC PRESENT,BOTH PMN AND MONONUCLEAR RARE GRAM POSITIVE COCCI Performed at [facility], [address] [place], [place] [address]    Culture   Final    FEW METHICILLIN RESISTANT STAPHYLOCOCCUS AUREUS NO ANAEROBES ISOLATED; CULTURE IN PROGRESS FOR 5 DAYS    Report Status PENDING  Incomplete   Organism ID, Bacteria METHICILLIN RESISTANT STAPHYLOCOCCUS AUREUS  Final      Susceptibility   Methicillin resistant staphylococcus aureus - MIC*    CIPROFLOXACIN >=8 RESISTANT Resistant     ERYTHROMYCIN >=8 RESISTANT Resistant     GENTAMICIN <=0.5  SENSITIVE Sensitive     OXACILLIN >=4 RESISTANT Resistant     TETRACYCLINE <=1 SENSITIVE Sensitive     VANCOMYCIN 1 SENSITIVE Sensitive     TRIMETH/SULFA <=10 SENSITIVE Sensitive     CLINDAMYCIN <=0.25 SENSITIVE Sensitive     RIFAMPIN <=0.5 SENSITIVE Sensitive     Inducible Clindamycin NEGATIVE Sensitive     * FEW METHICILLIN RESISTANT STAPHYLOCOCCUS AUREUS    Procedures/Studies: CT LUMBAR SPINE W CONTRAST  Result Date: 12/06/2019 CLINICAL DATA:  Low back pain. Regional cellulitis. Question back infection. EXAM: CT LUMBAR SPINE WITH  CONTRAST TECHNIQUE: Multidetector CT imaging of the lumbar spine was performed without and with  intravenous contrast administration. Multiplanar CT image reconstructions were also generated. CONTRAST:  18mL OMNIPAQUE IOHEXOL 300 MG/ML  SOLN COMPARISON:  None. FINDINGS: Segmentation: 5 lumbar type vertebral bodies. Alignment: Normal Vertebrae: No fracture or primary bone finding. No finding to suggest regional infection of the spine. Paraspinal and other soft tissues: Inflammatory changes of the posterior soft tissues including the subcutaneous fat, left more than right. No sign of deep space extension. Disc levels: No abnormality from T11-12 through L1-2. L2-3: Mild bulging of the disc. Bilateral facet osteoarthritis. Mild canal narrowing but without visible neural compression. L3-4: Normal interspace. L4-5: Normal interspace. L5-S1: Mild facet hypertrophy.  No stenosis. Solid bridging osteophytes of the right SI joint. IMPRESSION: 1. Inflammatory changes of the posterior soft tissues including the subcutaneous fat, left more than right. No sign of deep space extension. No evidence of lumbar region fracture or bone destruction. No sign of discitis. 2. 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 This could contribute to back pain. 3. Solid bridging osteophytes of the right SI joint. Electronically Signed   By: Paulina Fusi M.D.   On: 12/06/2019 12:34   DG Chest Port 1 View  Result Date: 12/04/2019 CLINICAL DATA:  Shortness of breath EXAM: PORTABLE CHEST 1 VIEW COMPARISON:  None. FINDINGS: No focal opacity or pleural effusion. Normal cardiomediastinal silhouette. No pneumothorax. IMPRESSION: No active disease. Electronically Signed   By: Jasmine Pang M.D.   On: 12/04/2019 17:33   [name] LT LOWER EXTREM LTD SOFT TISSUE NON VASCULAR  Result Date: 12/07/2019 CLINICAL DATA:  Redness and swelling left thigh region for approximately 1 week EXAM: ULTRASOUND LEFT POSTEROLATERAL THIGH TECHNIQUE: Longitudinal and transverse images of the posterolateral left thigh region obtained. COMPARISON:  None.  FINDINGS: Soft tissue swelling is noted in the posterolateral left thigh region. No fluid collection or mass. Evident abscess. No adenopathy. IMPRESSION: Soft tissue swelling in the posterolateral left thigh without abnormal fluid, mass, or abscess evident. No focal lesion appreciable in this area by ultrasound. Electronically Signed   By: Bretta Bang III M.D.   On: 12/07/2019 10:11   [name] CHEST SOFT TISSUE  Result Date: 12/05/2019 CLINICAL DATA:  Swelling, erythema, and pain in right lower back EXAM: ULTRASOUND OF HEAD/NECK SOFT TISSUES TECHNIQUE: Ultrasound examination of the head and neck soft tissues was performed in the area of clinical concern. COMPARISON:  None. FINDINGS: Sonographic evaluation of the right lower back was performed. Normal subcutaneous fat is identified. No fluid collections or abscess. IMPRESSION: 1. Unremarkable superficial soft tissues of the right lower back. No fluid collection or abscess. Electronically Signed   By: Sharlet Salina M.D.   On: 12/05/2019 15:00   [name] EKG SITE RITE  Result Date: 12/12/2019 If Site Rite image not attached, placement could not be confirmed due to current cardiac rhythm.    Time coordinating  discharge: Over 30 minutes  SIGNED:   Alessandra Bevels, MD  [facility] 12/14/2019, 8:34 AM

## 2019-12-14 NOTE — Plan of Care (Signed)
  Problem: Education: Goal: Knowledge of General Education information will improve Description: Including pain rating scale, medication(s)/side effects and non-pharmacologic comfort measures Outcome: Adequate for Discharge   Problem: Health Behavior/Discharge Planning: Goal: Ability to manage health-related needs will improve Outcome: Adequate for Discharge   Problem: Clinical Measurements: Goal: Ability to maintain clinical measurements within normal limits will improve Outcome: Adequate for Discharge Goal: Will remain free from infection Outcome: Adequate for Discharge Goal: Diagnostic test results will improve Outcome: Adequate for Discharge Goal: Respiratory complications will improve Outcome: Adequate for Discharge Goal: Cardiovascular complication will be avoided Outcome: Adequate for Discharge   Problem: Activity: Goal: Risk for activity intolerance will decrease Outcome: Adequate for Discharge   Problem: Nutrition: Goal: Adequate nutrition will be maintained Outcome: Adequate for Discharge   Problem: Coping: Goal: Level of anxiety will decrease Outcome: Adequate for Discharge   Problem: Elimination: Goal: Will not experience complications related to urinary retention Outcome: Adequate for Discharge   Problem: Pain Managment: Goal: General experience of comfort will improve Outcome: Adequate for Discharge   Problem: Safety: Goal: Ability to remain free from injury will improve Outcome: Adequate for Discharge   Problem: Skin Integrity: Goal: Risk for impaired skin integrity will decrease Outcome: Adequate for Discharge   Problem: Fluid Volume: Goal: Ability to maintain a balanced intake and output will improve Outcome: Adequate for Discharge   Problem: Metabolic: Goal: Ability to maintain appropriate glucose levels will improve Outcome: Adequate for Discharge   Problem: Nutritional: Goal: Maintenance of adequate nutrition will improve Outcome:  Adequate for Discharge Goal: Progress toward achieving an optimal weight will improve Outcome: Adequate for Discharge   Problem: Skin Integrity: Goal: Risk for impaired skin integrity will decrease Outcome: Adequate for Discharge   Problem: Tissue Perfusion: Goal: Adequacy of tissue perfusion will improve Outcome: Adequate for Discharge

## 2019-12-15 DIAGNOSIS — L0291 Cutaneous abscess, unspecified: Secondary | ICD-10-CM | POA: Diagnosis not present

## 2019-12-15 DIAGNOSIS — E1110 Type 2 diabetes mellitus with ketoacidosis without coma: Secondary | ICD-10-CM | POA: Diagnosis not present

## 2019-12-15 DIAGNOSIS — E1169 Type 2 diabetes mellitus with other specified complication: Secondary | ICD-10-CM | POA: Diagnosis not present

## 2019-12-15 DIAGNOSIS — N179 Acute kidney failure, unspecified: Secondary | ICD-10-CM | POA: Diagnosis not present

## 2019-12-15 LAB — GLUCOSE, CAPILLARY
Glucose-Capillary: 248 mg/dL — ABNORMAL HIGH (ref 70–99)
Glucose-Capillary: 180 mg/dL — ABNORMAL HIGH (ref 70–99)
Glucose-Capillary: 175 mg/dL — ABNORMAL HIGH (ref 70–99)

## 2019-12-15 MED ORDER — INSULIN GLARGINE 100 UNIT/ML ~~LOC~~ SOLN
35.0000 [IU] | Freq: Every day | SUBCUTANEOUS | Status: DC
  Administered 2019-12-16: 35 [IU] via SUBCUTANEOUS
  Filled 2019-12-15: qty 0.35

## 2019-12-15 MED ORDER — SODIUM CHLORIDE 0.9 % IV SOLN
100.0000 mg | Freq: Two times a day (BID) | INTRAVENOUS | Status: AC
  Administered 2019-12-16 – 2019-12-22 (×13): 100 mg via INTRAVENOUS
  Filled 2019-12-15 (×14): qty 100

## 2019-12-15 MED ORDER — HYDROMORPHONE HCL 1 MG/ML IJ SOLN
0.5000 mg | Freq: Two times a day (BID) | INTRAMUSCULAR | Status: DC | PRN
  Administered 2019-12-15 – 2019-12-22 (×15): 0.5 mg via INTRAVENOUS
  Filled 2019-12-15 (×15): qty 1

## 2019-12-15 MED ORDER — SODIUM CHLORIDE 0.9 % IV SOLN
100.0000 mg | Freq: Two times a day (BID) | INTRAVENOUS | Status: DC
  Administered 2019-12-15: 100 mg via INTRAVENOUS
  Filled 2019-12-15 (×3): qty 100

## 2019-12-15 NOTE — Progress Notes (Signed)
Inpatient Diabetes Program Recommendations  AACE/ADA: New Consensus Statement on Inpatient Glycemic Control (2015)  Target Ranges:  Prepandial:   less than 140 mg/dL      Peak postprandial:   less than 180 mg/dL (1-2 hours)      Critically ill patients:  140 - 180 mg/dL   Lab Results  Component Value Date   GLUCAP 271 (H) 12/14/2019   HGBA1C 10.3 (H) 12/04/2019    Review of Glycemic Control Results for [name], [name] (MRN [id number]) as of 12/15/2019 10:52  Ref. Range 12/13/2019 20:59 12/14/2019 07:33 12/14/2019 11:24 12/14/2019 17:16 12/14/2019 20:30  Glucose-Capillary Latest Ref Range: 70 - 99 mg/dL 700 (H) 174 (H) 944 (H) 129 (H) 271 (H)  Diabetes history:Type 2 DM Outpatient Diabetes medications:Novolog 15-30 units TID, Lantus 50 units QAM/25 units QPM, Actos 15 mg QD Current orders for Inpatient glycemic control:Lantus 30 units QD, Novolog 12 units TID, Novolog 0-15 units TID, Novolog 0-5 units QHS  Inpatient Diabetes Program Recommendations:    Please increase Lantus to 40 units daily.   Thanks  Beryl Meager, RN, BC-ADM Inpatient Diabetes Coordinator Pager [phone] (8a-5p)

## 2019-12-15 NOTE — TOC Progression Note (Addendum)
Transition of Care [facility]) - Progression Note    Patient Details  Name: Mark [name] MRN: [id number] Date of Birth: [birth year]  Transition of Care [facility]) CM/SW Contact  Beckie Busing, RN Phone Number: [phone]  12/15/2019, 2:20 PM  Clinical Narrative:    Community living center packet is complete. CM received email from [facility] SW at [place] to inform CM of the fax number [phone]. CM has attempted to fax forms numerous times and with transfer failure. CM called Community living center to verify fax number message has been left. Awaiting return call to verify fax number.    Expected Discharge Plan: Skilled Nursing Facility (short term rehab) Barriers to Discharge: Other (comment) (Patient requiring daily dressing changes. Home health/ wound center can not provide)  Expected Discharge Plan and Services Expected Discharge Plan: Skilled Nursing Facility (short term rehab) In-house Referral: NA Discharge Planning Services: CM Consult   Living arrangements for the past 2 months: Single Family Home Expected Discharge Date: 12/14/19               DME Arranged: N/A DME Agency: NA       HH Arranged: NA (Patient is active with [facility] but unable to set up [facility] due to need for short term rehab) HH Agency: NA (Patient active with [facility] prior to admission)         Social Determinants of Health (SDOH) Interventions    Readmission Risk Interventions No flowsheet data found.

## 2019-12-15 NOTE — Progress Notes (Signed)
Progress Note    Mark [name]  DJT:[id number] DOB: 11-04-1960  DOA: 12/04/2019 PCP: [name]    Brief Narrative:     Medical records reviewed and are as summarized below:  Mark [name] is an 59 y.o. male with medical history significant forinsulin-dependent type 2 diabetes, hypertension, hyperlipidemia, right AKA, depression, anxiety, PTSD. Patient presented with DKA Also found to have cellulitis.  Slow to improve so ID consulted as well as GS for I/d in the OR.   Assessment/Plan:   Principal Problem:   Diabetic ketoacidosis associated with type 2 diabetes mellitus (HCC) Active Problems:   AKI (acute kidney injury) (HCC)   Depression with anxiety   Hypertension associated with diabetes (HCC)   Hyperlipidemia associated with type 2 diabetes mellitus (HCC)   Cellulitis of left lower extremity   Cellulitis of lower back   Pressure injury of skin   Abscess    DKA/DM type 2  -DKA resolved with insulin gtt -on lantus plus novolog- titrate as able -blood sugars improved  Cellulitis/abscess -IV abx per ID- recommend 10 days of doxycycline -went to the OR on 11/19 for I/D for 2 areas-thigh/back -wound care per general surgery -op cultures: MRSA  Chronic/acute pain -PRN IV with wound care and continue home PO  AKI -resolved  HTN -resume home meds  Depression/anxiety -buspar/wellbutrin  obesity Body mass index is 34.54 kg/m.   Family Communication/Anticipated D/C date and plan/Code Status   DVT prophylaxis: heparin Code Status: Full Code.  Disposition Plan: Status is: Inpatient  Remains inpatient appropriate because:Inpatient level of care appropriate due to severity of illness   Dispo: The patient is from: Home              Anticipated d/c is to: community care at [facility]              Anticipated d/c date is: when bed available at [facility]              Patient currently is not medically stable to d/c.-  placement at [facility] for wound care if family  not able to help and patient not able to do.         Medical Consultants:    ID  GS     Subjective:   awaiting placement at [facility]   Objective:    Vitals:   12/14/19 0912 12/14/19 1820 12/14/19 2140 12/15/19 0059  BP: (!) 146/58 (!) 151/62 (!) 161/81 140/84  Pulse: 91 86    Resp: 18     Temp:      TempSrc:      SpO2: 98%     Weight:      Height:        Intake/Output Summary (Last 24 hours) at 12/15/2019 1208 Last data filed at 12/15/2019 0800 Gross per 24 hour  Intake 378.74 ml  Output 1800 ml  Net -1421.26 ml   Filed Weights   12/04/19 1453 12/11/19 0843  Weight: 122 kg 122 kg    Exam:    General: Appearance:    Obese male in no acute distress     Lungs:     respirations unlabored  Heart:    Normal heart rate. Normal rhythm. No murmurs, rubs, or gallops.   MS:   Right AKA  Neurologic:   Awake, alert, oriented x 3. No apparent focal neurological           defect.    Data Reviewed:   I have personally  reviewed following labs and imaging studies:  Labs: Labs show the following:   Basic Metabolic Panel: Recent Labs  Lab 12/09/19 0158 12/09/19 0158 12/10/19 0131 12/13/19 0644  NA 139  --  139 137  K 3.8   < > 4.1 3.9  CL 107  --  106 104  CO2 21*  --  22 26  GLUCOSE 122*  --  157* 143*  BUN 15  --  15 12  CREATININE 0.90  --  1.02 0.98  CALCIUM 9.0  --  9.0 8.7*   < > = values in this interval not displayed.   GFR Estimated Creatinine Clearance: 112.6 mL/min (by C-G formula based on SCr of 0.98 mg/dL). Liver Function Tests: No results for input(s): AST, ALT, ALKPHOS, BILITOT, PROT, ALBUMIN in the last 168 hours. No results for input(s): LIPASE, AMYLASE in the last 168 hours. No results for input(s): AMMONIA in the last 168 hours. Coagulation profile No results for input(s): INR, PROTIME in the last 168 hours.  CBC: Recent Labs  Lab 12/09/19 0158 12/10/19 0131 12/13/19 0644  WBC 10.2 11.4* 9.0  HGB 10.5*  10.3* 10.0*  HCT 33.3* 32.4* 31.8*  MCV 83.0 82.0 82.6  PLT 212 205 221   Cardiac Enzymes: No results for input(s): CKTOTAL, CKMB, CKMBINDEX, TROPONINI in the last 168 hours. BNP (last 3 results) No results for input(s): PROBNP in the last 8760 hours. CBG: Recent Labs  Lab 12/14/19 1124 12/14/19 1716 12/14/19 2030 12/15/19 0745 12/15/19 1131  GLUCAP 203* 129* 271* 248* 180*   D-Dimer: No results for input(s): DDIMER in the last 72 hours. Hgb A1c: No results for input(s): HGBA1C in the last 72 hours. Lipid Profile: No results for input(s): CHOL, HDL, LDLCALC, TRIG, CHOLHDL, LDLDIRECT in the last 72 hours. Thyroid function studies: No results for input(s): TSH, T4TOTAL, T3FREE, THYROIDAB in the last 72 hours.  Invalid input(s): FREET3 Anemia work up: No results for input(s): VITAMINB12, FOLATE, FERRITIN, TIBC, IRON, RETICCTPCT in the last 72 hours. Sepsis Labs: Recent Labs  Lab 12/09/19 0158 12/10/19 0131 12/13/19 0644  PROCALCITON 0.18  --   --   WBC 10.2 11.4* 9.0    Microbiology Recent Results (from the past 240 hour(s))  MRSA PCR Screening     Status: Abnormal   Collection Time: 12/09/19 12:19 PM   Specimen: Nasal Mucosa; Nasopharyngeal  Result Value Ref Range Status   MRSA by PCR POSITIVE (A) NEGATIVE Final    Comment:        The GeneXpert MRSA Assay (FDA approved for NASAL specimens only), is one component of a comprehensive MRSA colonization surveillance program. It is not intended to diagnose MRSA infection nor to guide or monitor treatment for MRSA infections. CRITICAL RESULT CALLED TO, READ BACK BY AND VERIFIED WITH: RN [facility] COOPER AT 1416 12/09/19 BY MM. Performed at [facility], [address] [place], [place] [address]   Aerobic/Anaerobic Culture (surgical/deep wound)     Status: None (Preliminary result)   Collection Time: 12/11/19  9:57 AM   Specimen: PATH Other; Body Fluid  Result Value Ref Range Status   Specimen Description  ABSCESS  Final   Special Requests A BACK ABS  Final   Gram Stain   Final    MODERATE WBC PRESENT,BOTH PMN AND MONONUCLEAR RARE GRAM POSITIVE COCCI Performed at [facility], [address] [place], [place] [address]    Culture   Final    FEW METHICILLIN RESISTANT STAPHYLOCOCCUS AUREUS NO  ANAEROBES ISOLATED; CULTURE IN PROGRESS FOR 5 DAYS    Report Status PENDING  Incomplete   Organism ID, Bacteria METHICILLIN RESISTANT STAPHYLOCOCCUS AUREUS  Final      Susceptibility   Methicillin resistant staphylococcus aureus - MIC*    CIPROFLOXACIN >=8 RESISTANT Resistant     ERYTHROMYCIN >=8 RESISTANT Resistant     GENTAMICIN <=0.5 SENSITIVE Sensitive     OXACILLIN >=4 RESISTANT Resistant     TETRACYCLINE <=1 SENSITIVE Sensitive     VANCOMYCIN 1 SENSITIVE Sensitive     TRIMETH/SULFA <=10 SENSITIVE Sensitive     CLINDAMYCIN <=0.25 SENSITIVE Sensitive     RIFAMPIN <=0.5 SENSITIVE Sensitive     Inducible Clindamycin NEGATIVE Sensitive     * FEW METHICILLIN RESISTANT STAPHYLOCOCCUS AUREUS    Procedures and diagnostic studies:  No results found.  Medications:   . atorvastatin  80 mg Oral Q2000  . buPROPion  300 mg Oral Daily  . busPIRone  15 mg Oral TID  . carvedilol  12.5 mg Oral BID WC  . Chlorhexidine Gluconate Cloth  6 each Topical Q0600  . heparin  5,000 Units Subcutaneous Q8H  . hydrocerin   Topical BID  . insulin aspart  0-15 Units Subcutaneous TID WC  . insulin aspart  0-5 Units Subcutaneous QHS  . insulin aspart  12 Units Subcutaneous TID WC  . [START ON 12/16/2019] insulin glargine  35 Units Subcutaneous Daily  . lisinopril  20 mg Oral Daily  . mupirocin ointment   Nasal BID  . prazosin  2 mg Oral QHS   Continuous Infusions: . lactated ringers 10 mL/hr at 12/11/19 1221  . vancomycin 10 mL/hr at 12/15/19 0800     LOS: 11 days   Joseph Art  [facility]   How to contact the [facility] Attending or Consulting provider 7A - 7P or covering provider during  after hours 7P -7A, for this patient?  1. Check the care team in [facility] and look for a) attending/consulting TRH provider listed and b) the [facility] team listed 2. Log into [email or web address] and use [facility] universal password to access. If you do not have the password, please contact the hospital operator. 3. Locate the [facility] provider you are looking for under [facility] and page to a number that you can be directly reached. 4. If you still have difficulty reaching the provider, please page the [facility] (Director on Call) for the Hospitalists listed on amion for assistance.  12/15/2019, 12:08 PM

## 2019-12-16 ENCOUNTER — Other Ambulatory Visit: Payer: Self-pay

## 2019-12-16 LAB — GLUCOSE, CAPILLARY
Glucose-Capillary: 177 mg/dL — ABNORMAL HIGH (ref 70–99)
Glucose-Capillary: 187 mg/dL — ABNORMAL HIGH (ref 70–99)
Glucose-Capillary: 220 mg/dL — ABNORMAL HIGH (ref 70–99)
Glucose-Capillary: 231 mg/dL — ABNORMAL HIGH (ref 70–99)
Glucose-Capillary: 192 mg/dL — ABNORMAL HIGH (ref 70–99)
Glucose-Capillary: 205 mg/dL — ABNORMAL HIGH (ref 70–99)
Glucose-Capillary: 242 mg/dL — ABNORMAL HIGH (ref 70–99)
Glucose-Capillary: 275 mg/dL — ABNORMAL HIGH (ref 70–99)
Glucose-Capillary: 325 mg/dL — ABNORMAL HIGH (ref 70–99)
Glucose-Capillary: 397 mg/dL — ABNORMAL HIGH (ref 70–99)
Glucose-Capillary: 149 mg/dL — ABNORMAL HIGH (ref 70–99)
Glucose-Capillary: 163 mg/dL — ABNORMAL HIGH (ref 70–99)
Glucose-Capillary: 179 mg/dL — ABNORMAL HIGH (ref 70–99)
Glucose-Capillary: 190 mg/dL — ABNORMAL HIGH (ref 70–99)
Glucose-Capillary: 195 mg/dL — ABNORMAL HIGH (ref 70–99)
Glucose-Capillary: 196 mg/dL — ABNORMAL HIGH (ref 70–99)
Glucose-Capillary: 200 mg/dL — ABNORMAL HIGH (ref 70–99)
Glucose-Capillary: 206 mg/dL — ABNORMAL HIGH (ref 70–99)
Glucose-Capillary: 236 mg/dL — ABNORMAL HIGH (ref 70–99)
Glucose-Capillary: 244 mg/dL — ABNORMAL HIGH (ref 70–99)
Glucose-Capillary: 245 mg/dL — ABNORMAL HIGH (ref 70–99)
Glucose-Capillary: 265 mg/dL — ABNORMAL HIGH (ref 70–99)
Glucose-Capillary: 276 mg/dL — ABNORMAL HIGH (ref 70–99)
Glucose-Capillary: 300 mg/dL — ABNORMAL HIGH (ref 70–99)
Glucose-Capillary: 169 mg/dL — ABNORMAL HIGH (ref 70–99)

## 2019-12-16 LAB — CBC
HCT: 30.2 % — ABNORMAL LOW (ref 39.0–52.0)
Hemoglobin: 9.5 g/dL — ABNORMAL LOW (ref 13.0–17.0)
MCH: 26.2 pg (ref 26.0–34.0)
MCHC: 31.5 g/dL (ref 30.0–36.0)
MCV: 83.2 fL (ref 80.0–100.0)
Platelets: 224 10*3/uL (ref 150–400)
RBC: 3.63 MIL/uL — ABNORMAL LOW (ref 4.22–5.81)
RDW: 14.9 % (ref 11.5–15.5)
WBC: 8 10*3/uL (ref 4.0–10.5)
nRBC: 0 % (ref 0.0–0.2)

## 2019-12-16 LAB — BASIC METABOLIC PANEL
Anion gap: 7 (ref 5–15)
BUN: 17 mg/dL (ref 6–20)
CO2: 26 mmol/L (ref 22–32)
Calcium: 8.6 mg/dL — ABNORMAL LOW (ref 8.9–10.3)
Chloride: 104 mmol/L (ref 98–111)
Creatinine, Ser: 1.16 mg/dL (ref 0.61–1.24)
GFR, Estimated: >60 mL/min (ref >60)
Glucose, Bld: 206 mg/dL — ABNORMAL HIGH (ref 70–99)
Potassium: 4 mmol/L (ref 3.5–5.1)
Sodium: 137 mmol/L (ref 135–145)

## 2019-12-16 LAB — AEROBIC/ANAEROBIC CULTURE W GRAM STAIN (SURGICAL/DEEP WOUND)

## 2019-12-16 MED ORDER — INSULIN GLARGINE 100 UNIT/ML ~~LOC~~ SOLN
40.0000 [IU] | Freq: Every day | SUBCUTANEOUS | Status: DC
  Administered 2019-12-17 – 2019-12-22 (×6): 40 [IU] via SUBCUTANEOUS
  Filled 2019-12-16 (×6): qty 0.4

## 2019-12-16 NOTE — TOC Progression Note (Addendum)
Transition of Care [facility]) - Progression Note    Patient Details  Name: Leib Elahi MRN: [id number] Date of Birth: [birth year]  [facility]) CM/SW Contact  Beckie Busing, RN Phone Number: [phone]  12/16/2019, 10:30 AM  Clinical Narrative:  CM has made numerous attempts to fax CLC paperwork this am . CM has went to other units on different fax machines to ensure that it is not the fax machine. Fax continues to be unsuccessful. CM has called primary care social worker Mallie Darting [phone] ext [phone]) to  inquire if there is another way to submit paperwork. No answer with Jasmine and voicemail has been left. CM has attempted to call patient care coordinator Coralee North [phone] ext [phone]) who is the contact person for [facility] with no answer. Voicemail has been left and CM will continue to follow up.  1045 CM has attempted to call the main number for CLC to speak with someone to verify the fax number. The operator forwards the call to a number that just rings with no answer or option to leave a voicemail.     1300 CM received call fron Alecia Driver at [facility] to report that fax machine has been down. Alternate fax number provided for CM to fax CLC packet. CLC packet has b een faxed to [phone])    Expected Discharge Plan: Skilled Nursing Facility (short term rehab) Barriers to Discharge: Other (comment) (Patient requiring daily dressing changes. Home health/ wound center can not provide)  Expected Discharge Plan and Services Expected Discharge Plan: Skilled Nursing Facility (short term rehab) In-house Referral: NA Discharge Planning Services: CM Consult   Living arrangements for the past 2 months: Single Family Home Expected Discharge Date: 12/14/19               DME Arranged: N/A DME Agency: NA       HH Arranged: NA (Patient is active with [facility] but unable to set up [facility] due to need for short term rehab) HH Agency: NA (Patient active with  [facility] prior to admission)         Social Determinants of Health (SDOH) Interventions    Readmission Risk Interventions No flowsheet data found.

## 2019-12-16 NOTE — Progress Notes (Signed)
Progress Note    Mark [name]  [id number] DOB: 04/11/1960  DOA: 12/04/2019 PCP: [name]    Brief Narrative:     Medical records reviewed and are as summarized below:  Mark [name] is an 59 y.o. male with medical history significant forinsulin-dependent type 2 diabetes, hypertension, hyperlipidemia, right AKA, depression, anxiety, PTSD. Patient presented with DKA Also found to have cellulitis.  Slow to improve so ID consulted as well as GS for I/d in the OR. Await placement at a [place] facility.  Assessment/Plan:   Principal Problem:   Diabetic ketoacidosis associated with type 2 diabetes mellitus (HCC) Active Problems:   AKI (acute kidney injury) (HCC)   Depression with anxiety   Hypertension associated with diabetes (HCC)   Hyperlipidemia associated with type 2 diabetes mellitus (HCC)   Cellulitis of left lower extremity   Cellulitis of lower back   Pressure injury of skin   Abscess    DKA/DM type 2  -DKA resolved with insulin gtt -on lantus plus novolog- titrate as able -blood sugars improved  Cellulitis/abscess -IV abx per ID- recommend 10 days of doxycycline -went to the OR on 11/19 for I/D for 2 areas-thigh/back -wound care per general surgery -op cultures: MRSA  Chronic/acute pain -PRN IV with wound care and continue home PO  AKI -resolved  HTN -resume home meds  Depression/anxiety -buspar/wellbutrin  obesity Body mass index is 34.54 kg/m.   Family Communication/Anticipated D/C date and plan/Code Status   DVT prophylaxis: heparin Code Status: Full Code.  Disposition Plan: Status is: Inpatient  Remains inpatient appropriate because:Inpatient level of care appropriate due to severity of illness   Dispo: The patient is from: Home              Anticipated d/c is to: community care at [facility]              Anticipated d/c date is: when bed available at [facility]              Patient currently is not medically stable to d/c.-  placement  at [facility] for wound care if family not able to help and patient not able to do.         Medical Consultants:    ID  GS     Subjective:   awaiting placement at [facility]   Objective:    Vitals:   12/15/19 0059 12/15/19 1420 12/15/19 2053 12/16/19 0426  BP: 140/84 (!) 128/57 133/63 124/60  Pulse:  96 89 90  Resp:  16 20 20   Temp:   98.4 F (36.9 C) 98.3 F (36.8 C)  TempSrc:    Oral  SpO2:  100% 100% 99%  Weight:      Height:        Intake/Output Summary (Last 24 hours) at 12/16/2019 1150 Last data filed at 12/15/2019 2358 Gross per 24 hour  Intake --  Output 1425 ml  Net -1425 ml   Filed Weights   12/04/19 1453 12/11/19 0843  Weight: 122 kg 122 kg    Exam:  Obese male, NAD Respirations unlabored Right AKA  Data Reviewed:   I have personally reviewed following labs and imaging studies:  Labs: Labs show the following:   Basic Metabolic Panel: Recent Labs  Lab 12/10/19 0131 12/10/19 0131 12/13/19 0644 12/16/19 0520  NA 139  --  137 137  K 4.1   < > 3.9 4.0  CL 106  --  104 104  CO2 22  --  26 26  GLUCOSE 157*  --  143* 206*  BUN 15  --  12 17  CREATININE 1.02  --  0.98 1.16  CALCIUM 9.0  --  8.7* 8.6*   < > = values in this interval not displayed.   GFR Estimated Creatinine Clearance: 95.1 mL/min (by C-G formula based on SCr of 1.16 mg/dL). Liver Function Tests: No results for input(s): AST, ALT, ALKPHOS, BILITOT, PROT, ALBUMIN in the last 168 hours. No results for input(s): LIPASE, AMYLASE in the last 168 hours. No results for input(s): AMMONIA in the last 168 hours. Coagulation profile No results for input(s): INR, PROTIME in the last 168 hours.  CBC: Recent Labs  Lab 12/10/19 0131 12/13/19 0644 12/16/19 0520  WBC 11.4* 9.0 8.0  HGB 10.3* 10.0* 9.5*  HCT 32.4* 31.8* 30.2*  MCV 82.0 82.6 83.2  PLT 205 221 224   Cardiac Enzymes: No results for input(s): CKTOTAL, CKMB, CKMBINDEX, TROPONINI in the last 168  hours. BNP (last 3 results) No results for input(s): PROBNP in the last 8760 hours. CBG: Recent Labs  Lab 12/15/19 1131 12/15/19 1633 12/15/19 2052 12/16/19 0659 12/16/19 0810  GLUCAP 180* 175* 220* 187* 177*   D-Dimer: No results for input(s): DDIMER in the last 72 hours. Hgb A1c: No results for input(s): HGBA1C in the last 72 hours. Lipid Profile: No results for input(s): CHOL, HDL, LDLCALC, TRIG, CHOLHDL, LDLDIRECT in the last 72 hours. Thyroid function studies: No results for input(s): TSH, T4TOTAL, T3FREE, THYROIDAB in the last 72 hours.  Invalid input(s): FREET3 Anemia work up: No results for input(s): VITAMINB12, FOLATE, FERRITIN, TIBC, IRON, RETICCTPCT in the last 72 hours. Sepsis Labs: Recent Labs  Lab 12/10/19 0131 12/13/19 0644 12/16/19 0520  WBC 11.4* 9.0 8.0    Microbiology Recent Results (from the past 240 hour(s))  MRSA PCR Screening     Status: Abnormal   Collection Time: 12/09/19 12:19 PM   Specimen: Nasal Mucosa; Nasopharyngeal  Result Value Ref Range Status   MRSA by PCR POSITIVE (A) NEGATIVE Final    Comment:        The GeneXpert MRSA Assay (FDA approved for NASAL specimens only), is one component of a comprehensive MRSA colonization surveillance program. It is not intended to diagnose MRSA infection nor to guide or monitor treatment for MRSA infections. CRITICAL RESULT CALLED TO, READ BACK BY AND VERIFIED WITH: RN [facility] COOPER AT 1416 12/09/19 BY MM. Performed at [facility], [address], [place], [place] [address]   Aerobic/Anaerobic Culture (surgical/deep wound)     Status: None   Collection Time: 12/11/19  9:57 AM   Specimen: PATH Other; Body Fluid  Result Value Ref Range Status   Specimen Description ABSCESS  Final   Special Requests A BACK ABS  Final   Gram Stain   Final    MODERATE WBC PRESENT,BOTH PMN AND MONONUCLEAR RARE GRAM POSITIVE COCCI    Culture   Final    FEW METHICILLIN RESISTANT STAPHYLOCOCCUS AUREUS NO  ANAEROBES ISOLATED Performed at [facility], [address], [place], [place] [address]    Report Status 12/16/2019 FINAL  Final   Organism ID, Bacteria METHICILLIN RESISTANT STAPHYLOCOCCUS AUREUS  Final      Susceptibility   Methicillin resistant staphylococcus aureus - MIC*    CIPROFLOXACIN >=8 RESISTANT Resistant     ERYTHROMYCIN >=8 RESISTANT Resistant     GENTAMICIN <=0.5 SENSITIVE Sensitive     OXACILLIN >=4 RESISTANT Resistant  TETRACYCLINE <=1 SENSITIVE Sensitive     VANCOMYCIN 1 SENSITIVE Sensitive     TRIMETH/SULFA <=10 SENSITIVE Sensitive     CLINDAMYCIN <=0.25 SENSITIVE Sensitive     RIFAMPIN <=0.5 SENSITIVE Sensitive     Inducible Clindamycin NEGATIVE Sensitive     * FEW METHICILLIN RESISTANT STAPHYLOCOCCUS AUREUS    Procedures and diagnostic studies:  No results found.  Medications:   . atorvastatin  80 mg Oral Q2000  . buPROPion  300 mg Oral Daily  . busPIRone  15 mg Oral TID  . carvedilol  12.5 mg Oral BID WC  . Chlorhexidine Gluconate Cloth  6 each Topical Q0600  . heparin  5,000 Units Subcutaneous Q8H  . hydrocerin   Topical BID  . insulin aspart  0-15 Units Subcutaneous TID WC  . insulin aspart  0-5 Units Subcutaneous QHS  . insulin aspart  12 Units Subcutaneous TID WC  . insulin glargine  35 Units Subcutaneous Daily  . lisinopril  20 mg Oral Daily  . prazosin  2 mg Oral QHS   Continuous Infusions: . doxycycline (VIBRAMYCIN) IV 100 mg (12/16/19 0937)  . lactated ringers 10 mL/hr at 12/11/19 1221     LOS: 12 days   Joseph Art  [facility]   How to contact the [facility] Attending or Consulting provider 7A - 7P or covering provider during after hours 7P -7A, for this patient?  1. Check the care team in [facility] and look for a) attending/consulting TRH provider listed and b) the [facility] team listed 2. Log into [email or web address] and use [place]'s universal password to access. If you do not have the password, please contact the hospital  operator. 3. Locate the [facility] provider you are looking for under [facility] and page to a number that you can be directly reached. 4. If you still have difficulty reaching the provider, please page the [facility] (Director on Call) for the Hospitalists listed on amion for assistance.  12/16/2019, 11:50 AM

## 2019-12-16 NOTE — Patient Outreach (Signed)
[facility]) Care Management  12/16/2019  Mark [name] [birth year] [id number]   Transition of Care Referral  Referral Date: 12/10/2019 Referral Source: Inpatient DM RN Referral Reason: "DM, A1C 10.3" Date of Discharge: pending Facility: [facility] Insurance: [facility] Medicare   Patient has been inpatient 10 or more days. Per MER plan is for patient to discharge to SNF/rehab facility.   Plan: RN CM will close case at this time.   Antionette Fairy, RN,BSN,CCM [facility] Care Management Telephonic Care Management Coordinator Direct Phone: [phone] Toll Free: [phone] Fax: [phone]

## 2019-12-17 LAB — GLUCOSE, CAPILLARY
Glucose-Capillary: 225 mg/dL — ABNORMAL HIGH (ref 70–99)
Glucose-Capillary: 230 mg/dL — ABNORMAL HIGH (ref 70–99)
Glucose-Capillary: 239 mg/dL — ABNORMAL HIGH (ref 70–99)
Glucose-Capillary: 175 mg/dL — ABNORMAL HIGH (ref 70–99)
Glucose-Capillary: 128 mg/dL — ABNORMAL HIGH (ref 70–99)

## 2019-12-17 MED ORDER — TRAZODONE HCL 50 MG PO TABS
25.0000 mg | ORAL_TABLET | Freq: Every evening | ORAL | Status: DC | PRN
  Administered 2019-12-17 – 2019-12-21 (×5): 25 mg via ORAL
  Filled 2019-12-17 (×5): qty 1

## 2019-12-17 NOTE — Progress Notes (Signed)
Progress Note    Mark [name]  [id number]:[id number] DOB: [birth year]  DOA: 12/04/2019 PCP: [name]    Brief Narrative:     Medical records reviewed and are as summarized below:  Mark [name] is an 59 y.o. male with medical history significant forinsulin-dependent type 2 diabetes, hypertension, hyperlipidemia, right AKA, depression, anxiety, PTSD. Patient presented with DKA Also found to have cellulitis.  Slow to improve so ID consulted as well as GS for I/d in the OR. Await placement at a [place] facility.  Assessment/Plan:   Principal Problem:   Diabetic ketoacidosis associated with type 2 diabetes mellitus (HCC) Active Problems:   AKI (acute kidney injury) (HCC)   Depression with anxiety   Hypertension associated with diabetes (HCC)   Hyperlipidemia associated with type 2 diabetes mellitus (HCC)   Cellulitis of left lower extremity   Cellulitis of lower back   Pressure injury of skin   Abscess    DKA/DM type 2  -DKA resolved with insulin gtt -on lantus plus novolog- titrate as able -blood sugars improved  Cellulitis/abscess -IV abx per ID- recommend 10 days of doxycycline -went to the OR on 11/19 for I/D for 2 areas-thigh/back -wound care per general surgery -op cultures: MRSA  Chronic/acute pain -PRN IV meds with wound care and continue home PO  AKI -resolved  HTN -resume home meds  Depression/anxiety -buspar/wellbutrin  obesity Body mass index is 34.54 kg/m.  Insomnia -trial of trazodone but will need to monitor Qtc   Family Communication/Anticipated D/C date and plan/Code Status   DVT prophylaxis: heparin Code Status: Full Code.  Disposition Plan: Status is: Inpatient  Remains inpatient appropriate because:Inpatient level of care appropriate due to severity of illness   Dispo: The patient is from: Home              Anticipated d/c is to: community care at [facility]              Anticipated d/c date is: when [facility]               Patient currently ready but [facility]        Medical Consultants:    ID  GS     Subjective:   Continue to await placement   Objective:    Vitals:   12/15/19 2053 12/16/19 0426 12/16/19 1415 12/17/19 0512  BP: 133/63 124/60 135/63 (!) 124/54  Pulse: 89 90 88 87  Resp: 20 20 19 19   Temp: 98.4 F (36.9 C) 98.3 F (36.8 C) 98.3 F (36.8 C) 98.4 F (36.9 C)  TempSrc:  Oral Oral Oral  SpO2: 100% 99% 99% 99%  Weight:      Height:        Intake/Output Summary (Last 24 hours) at 12/17/2019 1112 Last data filed at 12/17/2019 0700 Gross per 24 hour  Intake 250 ml  Output 500 ml  Net -250 ml   Filed Weights   12/04/19 1453 12/11/19 0843  Weight: 122 kg 122 kg    Exam:  In bed, NAD  Data Reviewed:   I have personally reviewed following labs and imaging studies:  Labs: Labs show the following:   Basic Metabolic Panel: Recent Labs  Lab 12/13/19 0644 12/16/19 0520  NA 137 137  K 3.9 4.0  CL 104 104  CO2 26 26  GLUCOSE 143* 206*  BUN 12 17  CREATININE 0.98 1.16  CALCIUM 8.7* 8.6*   GFR Estimated  Creatinine Clearance: 95.1 mL/min (by C-G formula based on SCr of 1.16 mg/dL). Liver Function Tests: No results for input(s): AST, ALT, ALKPHOS, BILITOT, PROT, ALBUMIN in the last 168 hours. No results for input(s): LIPASE, AMYLASE in the last 168 hours. No results for input(s): AMMONIA in the last 168 hours. Coagulation profile No results for input(s): INR, PROTIME in the last 168 hours.  CBC: Recent Labs  Lab 12/13/19 0644 12/16/19 0520  WBC 9.0 8.0  HGB 10.0* 9.5*  HCT 31.8* 30.2*  MCV 82.6 83.2  PLT 221 224   Cardiac Enzymes: No results for input(s): CKTOTAL, CKMB, CKMBINDEX, TROPONINI in the last 168 hours. BNP (last 3 results) No results for input(s): PROBNP in the last 8760 hours. CBG: Recent Labs  Lab 12/15/19 2052 12/16/19 0659 12/16/19 0810 12/16/19 1156 12/16/19 1554  GLUCAP 220* 187* 177*  231* 169*   D-Dimer: No results for input(s): DDIMER in the last 72 hours. Hgb A1c: No results for input(s): HGBA1C in the last 72 hours. Lipid Profile: No results for input(s): CHOL, HDL, LDLCALC, TRIG, CHOLHDL, LDLDIRECT in the last 72 hours. Thyroid function studies: No results for input(s): TSH, T4TOTAL, T3FREE, THYROIDAB in the last 72 hours.  Invalid input(s): FREET3 Anemia work up: No results for input(s): VITAMINB12, FOLATE, FERRITIN, TIBC, IRON, RETICCTPCT in the last 72 hours. Sepsis Labs: Recent Labs  Lab 12/13/19 0644 12/16/19 0520  WBC 9.0 8.0    Microbiology Recent Results (from the past 240 hour(s))  MRSA PCR Screening     Status: Abnormal   Collection Time: 12/09/19 12:19 PM   Specimen: Nasal Mucosa; Nasopharyngeal  Result Value Ref Range Status   MRSA by PCR POSITIVE (A) NEGATIVE Final    Comment:        The GeneXpert MRSA Assay (FDA approved for NASAL specimens only), is one component of a comprehensive MRSA colonization surveillance program. It is not intended to diagnose MRSA infection nor to guide or monitor treatment for MRSA infections. CRITICAL RESULT CALLED TO, READ BACK BY AND VERIFIED WITH: RN [facility] COOPER AT 1416 12/09/19 BY MM. Performed at [facility], [address], [place], [place] [address]   Aerobic/Anaerobic Culture (surgical/deep wound)     Status: None   Collection Time: 12/11/19  9:57 AM   Specimen: PATH Other; Body Fluid  Result Value Ref Range Status   Specimen Description ABSCESS  Final   Special Requests A BACK ABS  Final   Gram Stain   Final    MODERATE WBC PRESENT,BOTH PMN AND MONONUCLEAR RARE GRAM POSITIVE COCCI    Culture   Final    FEW METHICILLIN RESISTANT STAPHYLOCOCCUS AUREUS NO ANAEROBES ISOLATED Performed at [facility], [address] [place], [place] [address]    Report Status 12/16/2019 FINAL  Final   Organism ID, Bacteria METHICILLIN RESISTANT STAPHYLOCOCCUS AUREUS  Final       Susceptibility   Methicillin resistant staphylococcus aureus - MIC*    CIPROFLOXACIN >=8 RESISTANT Resistant     ERYTHROMYCIN >=8 RESISTANT Resistant     GENTAMICIN <=0.5 SENSITIVE Sensitive     OXACILLIN >=4 RESISTANT Resistant     TETRACYCLINE <=1 SENSITIVE Sensitive     VANCOMYCIN 1 SENSITIVE Sensitive     TRIMETH/SULFA <=10 SENSITIVE Sensitive     CLINDAMYCIN <=0.25 SENSITIVE Sensitive     RIFAMPIN <=0.5 SENSITIVE Sensitive     Inducible Clindamycin NEGATIVE Sensitive     * FEW METHICILLIN RESISTANT STAPHYLOCOCCUS AUREUS    Procedures and  diagnostic studies:  No results found.  Medications:   . atorvastatin  80 mg Oral Q2000  . buPROPion  300 mg Oral Daily  . busPIRone  15 mg Oral TID  . carvedilol  12.5 mg Oral BID WC  . Chlorhexidine Gluconate Cloth  6 each Topical Q0600  . heparin  5,000 Units Subcutaneous Q8H  . hydrocerin   Topical BID  . insulin aspart  0-15 Units Subcutaneous TID WC  . insulin aspart  0-5 Units Subcutaneous QHS  . insulin aspart  12 Units Subcutaneous TID WC  . insulin glargine  40 Units Subcutaneous Daily  . lisinopril  20 mg Oral Daily  . prazosin  2 mg Oral QHS   Continuous Infusions: . doxycycline (VIBRAMYCIN) IV 100 mg (12/17/19 1003)  . lactated ringers 10 mL/hr at 12/11/19 1221     LOS: 13 days   Joseph Art  [facility]   How to contact the [facility] Attending or Consulting provider 7A - 7P or covering provider during after hours 7P -7A, for this patient?  1. Check the care team in [facility] and look for a) attending/consulting TRH provider listed and b) the [facility] team listed 2. Log into [email or web address] and use [place]'s universal password to access. If you do not have the password, please contact the hospital operator. 3. Locate the [facility] provider you are looking for under [facility] and page to a number that you can be directly reached. 4. If you still have difficulty reaching the provider, please page the [facility] (Director on  Call) for the Hospitalists listed on amion for assistance.  12/17/2019, 11:12 AM

## 2019-12-18 LAB — GLUCOSE, CAPILLARY
Glucose-Capillary: 86 mg/dL (ref 70–99)
Glucose-Capillary: 256 mg/dL — ABNORMAL HIGH (ref 70–99)

## 2019-12-18 NOTE — TOC Progression Note (Signed)
Transition of Care [facility]) - Progression Note    Patient Details  Name: Jaquail Mclees MRN: [id number] Date of Birth: Jan 30, 1960  Transition of [facility] CM/SW Contact  Pollie Friar, RN Phone Number: 12/18/2019, 2:59 PM  Clinical Narrative:    CM met with the patient and he states his son will be coming home from college on Sunday and will learn the dressing changes so that he can do them inbetween the [facility] RN. CM has updated [facility] who he is active with for [facility]. CM will also update the MD. The bedside RN is aware the son will need to be educated on the dressing changes prior to d/c.  Pt is not sure timing on Sunday that his son will arrive so may be Monday before he is able to come to the hospital.  Pt has all needed DME at home.  TOC following.   Expected Discharge Plan: [place] (short term rehab) Barriers to Discharge: Other (comment) (Patient requiring daily dressing changes. Home health/ wound center can not provide)  Expected Discharge Plan and Services Expected Discharge Plan: [place] (short term rehab) In-house Referral: NA Discharge Planning Services: CM Consult   Living arrangements for the past 2 months: Single Family Home Expected Discharge Date: 12/14/19               DME Arranged: N/A DME Agency: NA       HH Arranged: NA (Patient is active with [facility] but unable to set up [facility] due to need for short term rehab) [place] Agency: NA (Patient active with [facility] prior to admission)         Social Determinants of Health (SDOH) Interventions    Readmission Risk Interventions No flowsheet data found.

## 2019-12-18 NOTE — Evaluation (Signed)
Occupational Therapy Evaluation and Discharge Patient Details Name: Mark [name] MRN: [id number] DOB: [birth year] Today's Date: 12/18/2019    History of Present Illness Mark [name] is a 59 y.o. male with medical history significant for insulin-dependent type 2 diabetes, hypertension, hyperlipidemia, right AKA, depression, anxiety, PTSD who presents to the ED for evaluation of shortness of breath and skin wounds. Found to have DKA and cellulitis   Clinical Impression   This 59 yo male admitted with above presents to acute OT with PLOF of being Mod I to independent with all basic ADLs from power chair level, IADLs from power chair level, driving and working full time. Pt currently at this Mod I level and feels he will not have any difficulties with getting back to his normal routine at home as long as he has A with his wound care. No further OT needs, we will sign off.    Follow Up Recommendations  No OT follow up    Equipment Recommendations  None recommended by OT       Precautions / Restrictions Precautions Precautions: None (with squat pivot transfers) Restrictions Weight Bearing Restrictions: Yes RLE Weight Bearing: Non weight bearing      Mobility Bed Mobility Overal bed mobility: Independent                  Transfers Overall transfer level: Modified independent Equipment used: None Transfers: Sit to/from [employer] Sit to Stand: Modified independent (Device/Increase time) Stand pivot transfers: Modified independent (Device/Increase time)       General transfer comment: Does use crutches for ~6 feet distance from where he stores power scooter on his car to door on his car    Balance Overall balance assessment: No apparent balance deficits (not formally assessed) (for sitting or squat pivot transfers)                                         ADL either performed or assessed with clinical judgement   ADL Overall ADL's :  Modified independent                                       General ADL Comments: from motorized W/C level     Vision Patient Visual Report: No change from baseline              Pertinent Vitals/Pain Pain Assessment: No/denies pain     Hand Dominance Right   Extremity/Trunk Assessment Upper Extremity Assessment Upper Extremity Assessment: Overall WFL for tasks assessed           Communication Communication Communication: No difficulties   Cognition Arousal/Alertness: Awake/alert Behavior During Therapy: WFL for tasks assessed/performed Overall Cognitive Status: Within Functional Limits for tasks assessed                                                Home Living Family/patient expects to be discharged to:: Private residence Living Arrangements: Alone Available Help at Discharge: Family;Available 24 hours/day Type of Home: House Home Access: Ramped entrance     Home Layout: One level     Bathroom Shower/Tub: Tub/shower unit;Curtain   Bathroom Toilet: Handicapped height  Home Equipment: Walker - 2 wheels;Crutches;Tub bench;Hand held shower head;Wheelchair - Engineer, technical sales - power;Other (comment) (crutches)   Additional Comments: family coming to stay with him for a few days      Prior Functioning/Environment Level of Independence: Independent with assistive device(s)                          OT Goals(Current goals can be found in the care plan section) Acute Rehab OT Goals Patient Stated Goal: to go home soon  OT Frequency:                AM-PAC OT "6 Clicks" Daily Activity     Outcome Measure Help from another person eating meals?: None Help from another person taking care of personal grooming?: None Help from another person toileting, which includes using toliet, bedpan, or urinal?: None Help from another person bathing (including washing, rinsing, drying)?: None Help from another person to put  on and taking off regular upper body clothing?: None Help from another person to put on and taking off regular lower body clothing?: None 6 Click Score: 24   End of Session    Activity Tolerance: Patient tolerated treatment well Patient left: in chair;with call bell/phone within reach  OT Visit Diagnosis: Other abnormalities of gait and mobility (R26.89)                Time: 2585-2778 OT Time Calculation (min): 15 min Charges:  OT General Charges $OT Visit: 1 Visit OT Evaluation $OT Eval Low Complexity: 1 Low  Mark [name], OTR/L Acute [employer] Pager [phone] Office [phone]     Mark [name] 12/18/2019, 3:58 PM

## 2019-12-18 NOTE — Progress Notes (Signed)
Progress Note    Mark [name]  [name]:[id number] DOB: July 19, 1960  DOA: 12/04/2019 PCP: [name]    Brief Narrative:     Medical records reviewed and are as summarized below:  Mark [name] is an 59 y.o. male with medical history significant forinsulin-dependent type 2 diabetes, hypertension, hyperlipidemia, right AKA, depression, anxiety, PTSD. Patient presented with DKA Also found to have cellulitis.  Slow to improve so ID consulted as well as GS for I/d in the OR. Await placement at a [place] facility.  Assessment/Plan:   Principal Problem:   Diabetic ketoacidosis associated with type 2 diabetes mellitus (HCC) Active Problems:   AKI (acute kidney injury) (HCC)   Depression with anxiety   Hypertension associated with diabetes (HCC)   Hyperlipidemia associated with type 2 diabetes mellitus (HCC)   Cellulitis of left lower extremity   Cellulitis of lower back   Pressure injury of skin   Abscess    DKA/DM type 2  -DKA resolved with insulin gtt -on lantus plus novolog- titrate as able -blood sugars improved  Cellulitis/abscess -IV abx per ID- recommend 10 days of doxycycline -went to the OR on 11/19 for I/D for 2 areas-thigh/back -wound care per general surgery -op cultures: MRSA  Chronic/acute pain -PRN IV meds with wound care and continue home PO  AKI -resolved  HTN -resume home meds  Depression/anxiety -buspar/wellbutrin  obesity Body mass index is 34.54 kg/m.  Insomnia -trial of trazodone but will need to monitor Qtc (stable as of 11/26)   Family Communication/Anticipated D/C date and plan/Code Status   DVT prophylaxis: heparin Code Status: Full Code.  Disposition Plan: Status is: Inpatient  Remains inpatient appropriate because:Inpatient level of care appropriate due to severity of illness   Dispo: The patient is from: Home              Anticipated d/c is to: community care at [facility]              Anticipated d/c date is: when bed  [name]              Patient currently ready but [facility]        Medical Consultants:    ID  GS     Subjective:   Continue to await placement   Objective:    Vitals:   12/17/19 0512 12/17/19 1141 12/17/19 1953 12/18/19 0906  BP: (!) 124/54 (!) 151/76 (!) 149/80 (!) 158/81  Pulse: 87 81 75 97  Resp: 19 18 18    Temp: 98.4 F (36.9 C) 97.9 F (36.6 C) 98.7 F (37.1 C)   TempSrc: Oral     SpO2: 99% 100% 100%   Weight:      Height:        Intake/Output Summary (Last 24 hours) at 12/18/2019 1117 Last data filed at 12/18/2019 0850 Gross per 24 hour  Intake --  Output 1550 ml  Net -1550 ml   Filed Weights   12/04/19 1453 12/11/19 0843  Weight: 122 kg 122 kg    Exam:  in bed, slept better with trazadone  Data Reviewed:   I have personally reviewed following labs and imaging studies:  Labs: Labs show the following:   Basic Metabolic Panel: Recent Labs  Lab 12/13/19 0644 12/16/19 0520  NA 137 137  K 3.9 4.0  CL 104 104  CO2 26 26  GLUCOSE 143* 206*  BUN 12 17  CREATININE 0.98 1.16  CALCIUM  8.7* 8.6*   GFR Estimated Creatinine Clearance: 95.1 mL/min (by C-G formula based on SCr of 1.16 mg/dL). Liver Function Tests: No results for input(s): AST, ALT, ALKPHOS, BILITOT, PROT, ALBUMIN in the last 168 hours. No results for input(s): LIPASE, AMYLASE in the last 168 hours. No results for input(s): AMMONIA in the last 168 hours. Coagulation profile No results for input(s): INR, PROTIME in the last 168 hours.  CBC: Recent Labs  Lab 12/13/19 0644 12/16/19 0520  WBC 9.0 8.0  HGB 10.0* 9.5*  HCT 31.8* 30.2*  MCV 82.6 83.2  PLT 221 224   Cardiac Enzymes: No results for input(s): CKTOTAL, CKMB, CKMBINDEX, TROPONINI in the last 168 hours. BNP (last 3 results) No results for input(s): PROBNP in the last 8760 hours. CBG: Recent Labs  Lab 12/17/19 0715 12/17/19 1140 12/17/19 1604 12/17/19 1954  12/18/19 0822  GLUCAP 230* 239* 175* 128* 86   D-Dimer: No results for input(s): DDIMER in the last 72 hours. Hgb A1c: No results for input(s): HGBA1C in the last 72 hours. Lipid Profile: No results for input(s): CHOL, HDL, LDLCALC, TRIG, CHOLHDL, LDLDIRECT in the last 72 hours. Thyroid function studies: No results for input(s): TSH, T4TOTAL, T3FREE, THYROIDAB in the last 72 hours.  Invalid input(s): FREET3 Anemia work up: No results for input(s): VITAMINB12, FOLATE, FERRITIN, TIBC, IRON, RETICCTPCT in the last 72 hours. Sepsis Labs: Recent Labs  Lab 12/13/19 0644 12/16/19 0520  WBC 9.0 8.0    Microbiology Recent Results (from the past 240 hour(s))  MRSA PCR Screening     Status: Abnormal   Collection Time: 12/09/19 12:19 PM   Specimen: Nasal Mucosa; Nasopharyngeal  Result Value Ref Range Status   MRSA by PCR POSITIVE (A) NEGATIVE Final    Comment:        The GeneXpert MRSA Assay (FDA approved for NASAL specimens only), is one component of a comprehensive MRSA colonization surveillance program. It is not intended to diagnose MRSA infection nor to guide or monitor treatment for MRSA infections. CRITICAL RESULT CALLED TO, READ BACK BY AND VERIFIED WITH: RN [facility] COOPER AT 1416 12/09/19 BY MM. Performed at [facility], [address] [place], [place] [address]   Aerobic/Anaerobic Culture (surgical/deep wound)     Status: None   Collection Time: 12/11/19  9:57 AM   Specimen: PATH Other; Body Fluid  Result Value Ref Range Status   Specimen Description ABSCESS  Final   Special Requests A BACK ABS  Final   Gram Stain   Final    MODERATE WBC PRESENT,BOTH PMN AND MONONUCLEAR RARE GRAM POSITIVE COCCI    Culture   Final    FEW METHICILLIN RESISTANT STAPHYLOCOCCUS AUREUS NO ANAEROBES ISOLATED Performed at [facility], [address] [place], [place] [address]    Report Status 12/16/2019 FINAL  Final   Organism ID, Bacteria METHICILLIN RESISTANT  STAPHYLOCOCCUS AUREUS  Final      Susceptibility   Methicillin resistant staphylococcus aureus - MIC*    CIPROFLOXACIN >=8 RESISTANT Resistant     ERYTHROMYCIN >=8 RESISTANT Resistant     GENTAMICIN <=0.5 SENSITIVE Sensitive     OXACILLIN >=4 RESISTANT Resistant     TETRACYCLINE <=1 SENSITIVE Sensitive     VANCOMYCIN 1 SENSITIVE Sensitive     TRIMETH/SULFA <=10 SENSITIVE Sensitive     CLINDAMYCIN <=0.25 SENSITIVE Sensitive     RIFAMPIN <=0.5 SENSITIVE Sensitive     Inducible Clindamycin NEGATIVE Sensitive     * FEW METHICILLIN RESISTANT STAPHYLOCOCCUS  AUREUS    Procedures and diagnostic studies:  No results found.  Medications:   . atorvastatin  80 mg Oral Q2000  . buPROPion  300 mg Oral Daily  . busPIRone  15 mg Oral TID  . carvedilol  12.5 mg Oral BID WC  . Chlorhexidine Gluconate Cloth  6 each Topical Q0600  . heparin  5,000 Units Subcutaneous Q8H  . hydrocerin   Topical BID  . insulin aspart  0-15 Units Subcutaneous TID WC  . insulin aspart  0-5 Units Subcutaneous QHS  . insulin aspart  12 Units Subcutaneous TID WC  . insulin glargine  40 Units Subcutaneous Daily  . lisinopril  20 mg Oral Daily  . prazosin  2 mg Oral QHS   Continuous Infusions: . doxycycline (VIBRAMYCIN) IV 100 mg (12/18/19 0907)  . lactated ringers 10 mL/hr at 12/11/19 1221     LOS: 14 days   Joseph Art  [facility]   How to contact the [facility] Attending or Consulting provider 7A - 7P or covering provider during after hours 7P -7A, for this patient?  1. Check the care team in [facility] and look for a) attending/consulting TRH provider listed and b) the [facility] team listed 2. Log into [email or web address] and use [place]'s universal password to access. If you do not have the password, please contact the hospital operator. 3. Locate the [facility] provider you are looking for under [facility] and page to a number that you can be directly reached. 4. If you still have difficulty reaching the provider,  please page the [facility] (Director on Call) for the Hospitalists listed on amion for assistance.  12/18/2019, 11:17 AM

## 2019-12-19 MED ORDER — INSULIN ASPART 100 UNIT/ML ~~LOC~~ SOLN
15.0000 [IU] | Freq: Three times a day (TID) | SUBCUTANEOUS | Status: DC
  Administered 2019-12-19 – 2019-12-22 (×8): 15 [IU] via SUBCUTANEOUS

## 2019-12-19 NOTE — Evaluation (Signed)
Physical Therapy Evaluation Patient Details Name: Mark [name] MRN: [id number] DOB: Aug 21, 1960 Today's Date: 12/19/2019   History of Present Illness  Mark [name] is a 59 y.o. male with medical history significant for insulin-dependent type 2 diabetes, hypertension, hyperlipidemia, right AKA, depression, anxiety, PTSD who presents to the ED for evaluation of shortness of breath and skin wounds. Found to have DKA and cellulitis  Clinical Impression  Pt is at or close to baseline functioning and should be safe at home with available assist. There are no further acute PT needs.  Will sign off at this time.     Follow Up Recommendations No PT follow up    Equipment Recommendations  None recommended by PT    Recommendations for Other Services       Precautions / Restrictions Precautions Precautions: None (small risk on crutches) Restrictions RLE Weight Bearing: Non weight bearing      Mobility  Bed Mobility Overal bed mobility: Independent                  Transfers Overall transfer level: Modified independent               General transfer comment: appropriate use of crutches during transfers.  Ambulation/Gait Ambulation/Gait assistance: Modified independent (Device/Increase time) Gait Distance (Feet): 40 Feet Assistive device: Crutches Gait Pattern/deviations: WFL(Within Functional Limits)   Gait velocity interpretation: <1.8 ft/sec, indicate of risk for recurrent falls General Gait Details: stable "swing through" pattern  Stairs            Wheelchair Mobility    Modified Rankin (Stroke Patients Only)       Balance Overall balance assessment: No apparent balance deficits (not formally assessed)                                           Pertinent Vitals/Pain Pain Assessment: Faces Faces Pain Scale: No hurt Pain Intervention(s): Monitored during session    Home Living Family/patient expects to be discharged to:: Private  residence Living Arrangements: Alone Available Help at Discharge: Family;Available 24 hours/day Type of Home: House Home Access: Ramped entrance     Home Layout: One level Home Equipment: Walker - 2 wheels;Crutches;Tub bench;Hand held shower head;Wheelchair - Engineer, technical sales - power;Other (comment) Additional Comments: family coming to stay with him for a few days    Prior Function Level of Independence: Independent with assistive device(s)               Hand Dominance   Dominant Hand: Right    Extremity/Trunk Assessment   Upper Extremity Assessment Upper Extremity Assessment: Overall WFL for tasks assessed    Lower Extremity Assessment Lower Extremity Assessment: Overall WFL for tasks assessed (NT R LE)       Communication   Communication: No difficulties  Cognition Arousal/Alertness: Awake/alert Behavior During Therapy: WFL for tasks assessed/performed Overall Cognitive Status: Within Functional Limits for tasks assessed                                        General Comments      Exercises     Assessment/Plan    PT Assessment Patent does not need any further PT services  PT Problem List         PT Treatment Interventions  PT Goals (Current goals can be found in the Care Plan section)  Acute Rehab PT Goals Patient Stated Goal: to go home soon PT Goal Formulation: All assessment and education complete, DC therapy    Frequency     Barriers to discharge        Co-evaluation               AM-PAC PT "6 Clicks" Mobility  Outcome Measure Help needed turning from your back to your side while in a flat bed without using bedrails?: None Help needed moving from lying on your back to sitting on the side of a flat bed without using bedrails?: None Help needed moving to and from a bed to a chair (including a wheelchair)?: None Help needed standing up from a chair using your arms (e.g., wheelchair or bedside chair)?:  None Help needed to walk in hospital room?: None Help needed climbing 3-5 steps with a railing? : None 6 Click Score: 24    End of Session   Activity Tolerance: Patient tolerated treatment well Patient left: in chair;with call bell/phone within reach Nurse Communication: Mobility status PT Visit Diagnosis: Other abnormalities of gait and mobility (R26.89)    Time: 1100-1115 PT Time Calculation (min) (ACUTE ONLY): 15 min   Charges:   PT Evaluation $PT Eval Low Complexity: 1 Low          12/19/2019  Jacinto Halim., PT Acute Rehabilitation Services [phone]  (pager) [phone]  (office)  Mark [name] 12/19/2019, 11:19 AM

## 2019-12-19 NOTE — Progress Notes (Signed)
Progress Note    Mark [name]  ZOX:[id number] DOB: Jan 07, 1961  DOA: 12/04/2019 [facility]CP: [name]    Brief Narrative:     Medical records reviewed and are as summarized below:  Mark [name] is an 59 y.o. male with medical history significant forinsulin-dependent type 2 diabetes, hypertension, hyperlipidemia, right AKA, depression, anxiety, PTSD. Patient presented with DKA Also found to have cellulitis.  Slow to improve so ID consulted as well as GS for I/d in the OR. D/c plan has changed from [place] [facility] to home with home health  Assessment/Plan:   Principal Problem:   Diabetic ketoacidosis associated with type 2 diabetes mellitus (HCC) Active Problems:   AKI (acute kidney injury) (HCC)   Depression with anxiety   Hypertension associated with diabetes (HCC)   Hyperlipidemia associated with type 2 diabetes mellitus (HCC)   Cellulitis of left lower extremity   Cellulitis of lower back   Pressure injury of skin   Abscess    DKA/DM type 2  -DKA resolved with insulin gtt -on lantus plus novolog- titrate as able -blood sugars improved  Cellulitis/abscess -IV abx per ID- recommend 10 days of doxycycline -went to the OR on 11/19 for I/D for 2 areas-thigh/back -wound care per general surgery -op cultures: MRSA  Chronic/acute pain -PRN IV meds with wound care and continue home PO  AKI -resolved  HTN -resume home meds  Depression/anxiety -buspar/wellbutrin  obesity Body mass index is 34.73 kg/m.  Insomnia -trial of trazodone but will need to monitor Qtc (stable as of 11/26)   Family Communication/Anticipated D/C date and plan/Code Status   DVT prophylaxis: heparin Code Status: Full Code.  Disposition Plan: Status is: Inpatient  Remains inpatient appropriate because:Inpatient level of care appropriate due to severity of illness   Dispo: The patient is from: Home              Anticipated d/c is to: [facility] care at [facility]              Anticipated d/c  date is: after son is home and taught dressing changes-- Monday most likely        Medical Consultants:    ID  GS     Subjective:   No complaints  Objective:    Vitals:   12/18/19 0906 12/18/19 1805 12/18/19 2051 12/19/19 0545  BP: (!) 158/81 (!) 147/74 131/81 102/60  Pulse: 97 89 85 87  Resp:   16 20  Temp:   98.5 F (36.9 C) 97.9 F (36.6 C)  TempSrc:   Oral Oral  SpO2:   100% 97%  Weight:    122.7 kg  Height:        Intake/Output Summary (Last 24 hours) at 12/19/2019 1202 Last data filed at 12/19/2019 0600 Gross per 24 hour  Intake 579.87 ml  Output 550 ml  Net 29.87 ml   Filed Weights   12/04/19 1453 12/11/19 0843 12/19/19 0545  Weight: 122 kg 122 kg 122.7 kg    Exam: In bed, NAD  Data Reviewed:   I have personally reviewed following labs and imaging studies:  Labs: Labs show the following:   Basic Metabolic Panel: Recent Labs  Lab 12/13/19 0644 12/16/19 0520  NA 137 137  K 3.9 4.0  CL 104 104  CO2 26 26  GLUCOSE 143* 206*  BUN 12 17  CREATININE 0.98 1.16  CALCIUM 8.7* 8.6*   GFR Estimated Creatinine Clearance: 95.4 mL/min (by C-G formula based on SCr of 1.16  mg/dL). Liver Function Tests: No results for input(s): AST, ALT, ALKPHOS, BILITOT, PROT, ALBUMIN in the last 168 hours. No results for input(s): LIPASE, AMYLASE in the last 168 hours. No results for input(s): AMMONIA in the last 168 hours. Coagulation profile No results for input(s): INR, PROTIME in the last 168 hours.  CBC: Recent Labs  Lab 12/13/19 0644 12/16/19 0520  WBC 9.0 8.0  HGB 10.0* 9.5*  HCT 31.8* 30.2*  MCV 82.6 83.2  PLT 221 224   Cardiac Enzymes: No results for input(s): CKTOTAL, CKMB, CKMBINDEX, TROPONINI in the last 168 hours. BNP (last 3 results) No results for input(s): PROBNP in the last 8760 hours. CBG: Recent Labs  Lab 12/17/19 1140 12/17/19 1604 12/17/19 1954 12/18/19 0822 12/18/19 1233  GLUCAP 239* 175* 128* 86 256*    D-Dimer: No results for input(s): DDIMER in the last 72 hours. Hgb A1c: No results for input(s): HGBA1C in the last 72 hours. Lipid Profile: No results for input(s): CHOL, HDL, LDLCALC, TRIG, CHOLHDL, LDLDIRECT in the last 72 hours. Thyroid function studies: No results for input(s): TSH, T4TOTAL, T3FREE, THYROIDAB in the last 72 hours.  Invalid input(s): FREET3 Anemia work up: No results for input(s): VITAMINB12, FOLATE, FERRITIN, TIBC, IRON, RETICCTPCT in the last 72 hours. Sepsis Labs: Recent Labs  Lab 12/13/19 0644 12/16/19 0520  WBC 9.0 8.0    Microbiology Recent Results (from the past 240 hour(s))  MRSA PCR Screening     Status: Abnormal   Collection Time: 12/09/19 12:19 PM   Specimen: Nasal Mucosa; Nasopharyngeal  Result Value Ref Range Status   MRSA by PCR POSITIVE (A) NEGATIVE Final    Comment:        The GeneXpert MRSA Assay (FDA approved for NASAL specimens only), is one component of a comprehensive MRSA colonization surveillance program. It is not intended to diagnose MRSA infection nor to guide or monitor treatment for MRSA infections. CRITICAL RESULT CALLED TO, READ BACK BY AND VERIFIED WITH: RN [facility] COOPER AT 1416 12/09/19 BY MM. Performed at [facility], [address] [place], [place] [address]   Aerobic/Anaerobic Culture (surgical/deep wound)     Status: None   Collection Time: 12/11/19  9:57 AM   Specimen: PATH Other; Body Fluid  Result Value Ref Range Status   Specimen Description ABSCESS  Final   Special Requests A BACK ABS  Final   Gram Stain   Final    MODERATE WBC PRESENT,BOTH PMN AND MONONUCLEAR RARE GRAM POSITIVE COCCI    Culture   Final    FEW METHICILLIN RESISTANT STAPHYLOCOCCUS AUREUS NO ANAEROBES ISOLATED Performed at [facility], [address], [place], [place] [address]    Report Status 12/16/2019 FINAL  Final   Organism ID, Bacteria METHICILLIN RESISTANT STAPHYLOCOCCUS AUREUS  Final      Susceptibility    Methicillin resistant staphylococcus aureus - MIC*    CIPROFLOXACIN >=8 RESISTANT Resistant     ERYTHROMYCIN >=8 RESISTANT Resistant     GENTAMICIN <=0.5 SENSITIVE Sensitive     OXACILLIN >=4 RESISTANT Resistant     TETRACYCLINE <=1 SENSITIVE Sensitive     VANCOMYCIN 1 SENSITIVE Sensitive     TRIMETH/SULFA <=10 SENSITIVE Sensitive     CLINDAMYCIN <=0.25 SENSITIVE Sensitive     RIFAMPIN <=0.5 SENSITIVE Sensitive     Inducible Clindamycin NEGATIVE Sensitive     * FEW METHICILLIN RESISTANT STAPHYLOCOCCUS AUREUS    Procedures and diagnostic studies:  No results found.  Medications:   . atorvastatin  80 mg Oral Q2000  . buPROPion  300 mg Oral Daily  . busPIRone  15 mg Oral TID  . carvedilol  12.5 mg Oral BID WC  . Chlorhexidine Gluconate Cloth  6 each Topical Q0600  . heparin  5,000 Units Subcutaneous Q8H  . hydrocerin   Topical BID  . insulin aspart  0-15 Units Subcutaneous TID WC  . insulin aspart  0-5 Units Subcutaneous QHS  . insulin aspart  12 Units Subcutaneous TID WC  . insulin glargine  40 Units Subcutaneous Daily  . lisinopril  20 mg Oral Daily  . prazosin  2 mg Oral QHS   Continuous Infusions: . doxycycline (VIBRAMYCIN) IV Stopped (12/18/19 2250)  . lactated ringers 10 mL/hr at 12/18/19 2300     LOS: 15 days   Joseph Art  [facility]   How to contact the [facility] Attending or Consulting provider 7A - 7P or covering provider during after hours 7P -7A, for this patient?  1. Check the care team in [facility] and look for a) attending/consulting TRH provider listed and b) the [facility] team listed 2. Log into [email or web address] and use [place]'s universal password to access. If you do not have the password, please contact the hospital operator. 3. Locate the [facility] provider you are looking for under [facility] and page to a number that you can be directly reached. 4. If you still have difficulty reaching the provider, please page the [facility] (Director on Call) for the  Hospitalists listed on amion for assistance.  12/19/2019, 12:02 PM

## 2019-12-20 MED ORDER — CHLORHEXIDINE GLUCONATE CLOTH 2 % EX PADS
6.0000 | MEDICATED_PAD | Freq: Every day | CUTANEOUS | Status: DC
  Administered 2019-12-20 – 2019-12-22 (×3): 6 via TOPICAL

## 2019-12-20 NOTE — Progress Notes (Signed)
Progress Note    Mark [name]  HEN:[id number] DOB: 08-26-1960  DOA: 12/04/2019 [facility]CP: [name]    Brief Narrative:     Medical records reviewed and are as summarized below:  Mark [name] is an 59 y.o. male with medical history significant forinsulin-dependent type 2 diabetes, hypertension, hyperlipidemia, right AKA, depression, anxiety, PTSD. Patient presented with DKA Also found to have cellulitis.  Slow to improve so ID consulted as well as GS for I/d in the OR. D/c plan has changed from [place] [facility] to home with home health now uncertain as patient's son is not coming to help him.  Assessment/Plan:   Principal Problem:   Diabetic ketoacidosis associated with type 2 diabetes mellitus (HCC) Active Problems:   AKI (acute kidney injury) (HCC)   Depression with anxiety   Hypertension associated with diabetes (HCC)   Hyperlipidemia associated with type 2 diabetes mellitus (HCC)   Cellulitis of left lower extremity   Cellulitis of lower back   Pressure injury of skin   Abscess    DKA/DM type 2  -DKA resolved with insulin gtt -on lantus plus novolog- titrate as able -blood sugars improved  Cellulitis/abscess -IV abx per ID- recommend 10 days of doxycycline- stop date placed -went to the OR on 11/19 for I/D for 2 areas-thigh/back -wound care per general surgery -op cultures: MRSA  Chronic/acute pain -PRN IV meds with wound care and continue home PO  AKI -resolved  HTN -resume home meds  Depression/anxiety -buspar/wellbutrin  obesity Body mass index is 34.73 kg/m.  Insomnia -trial of trazodone but will need to monitor Qtc (stable as of 11/26)   Family Communication/Anticipated D/C date and plan/Code Status   DVT prophylaxis: heparin Code Status: Full Code.  Disposition Plan: Status is: Inpatient  Remains inpatient appropriate because:Inpatient level of care appropriate due to severity of illness   Dispo: The patient is from: Home               Anticipated d/c is to: uncertain        Medical Consultants:    ID  GS     Subjective:   Now says son is not able to come home and help with dressing changes  Objective:    Vitals:   12/19/19 0545 12/19/19 1257 12/19/19 2023 12/20/19 0835  BP: 102/60 137/74 (!) 144/65 (!) 148/71  Pulse: 87 90 84 92  Resp: 20 20 18 20   Temp: 97.9 F (36.6 C) 98.5 F (36.9 C) 98 F (36.7 C) 97.8 F (36.6 C)  TempSrc: Oral  Oral   SpO2: 97% 98% 100% 99%  Weight: 122.7 kg     Height:        Intake/Output Summary (Last 24 hours) at 12/20/2019 1200 Last data filed at 12/20/2019 0600 Gross per 24 hour  Intake 615.36 ml  Output 1400 ml  Net -784.64 ml   Filed Weights   12/04/19 1453 12/11/19 0843 12/19/19 0545  Weight: 122 kg 122 kg 122.7 kg    Exam: In bed, NAD rrr No increased work of breathing  Data Reviewed:   I have personally reviewed following labs and imaging studies:  Labs: Labs show the following:   Basic Metabolic Panel: Recent Labs  Lab 12/16/19 0520  NA 137  K 4.0  CL 104  CO2 26  GLUCOSE 206*  BUN 17  CREATININE 1.16  CALCIUM 8.6*   GFR Estimated Creatinine Clearance: 95.4 mL/min (by C-G formula based on SCr of 1.16 mg/dL). Liver Function  Tests: No results for input(s): AST, ALT, ALKPHOS, BILITOT, PROT, ALBUMIN in the last 168 hours. No results for input(s): LIPASE, AMYLASE in the last 168 hours. No results for input(s): AMMONIA in the last 168 hours. Coagulation profile No results for input(s): INR, PROTIME in the last 168 hours.  CBC: Recent Labs  Lab 12/16/19 0520  WBC 8.0  HGB 9.5*  HCT 30.2*  MCV 83.2  PLT 224   Cardiac Enzymes: No results for input(s): CKTOTAL, CKMB, CKMBINDEX, TROPONINI in the last 168 hours. BNP (last 3 results) No results for input(s): PROBNP in the last 8760 hours. CBG: Recent Labs  Lab 12/17/19 1140 12/17/19 1604 12/17/19 1954 12/18/19 0822 12/18/19 1233  GLUCAP 239* 175* 128* 86 256*    D-Dimer: No results for input(s): DDIMER in the last 72 hours. Hgb A1c: No results for input(s): HGBA1C in the last 72 hours. Lipid Profile: No results for input(s): CHOL, HDL, LDLCALC, TRIG, CHOLHDL, LDLDIRECT in the last 72 hours. Thyroid function studies: No results for input(s): TSH, T4TOTAL, T3FREE, THYROIDAB in the last 72 hours.  Invalid input(s): FREET3 Anemia work up: No results for input(s): VITAMINB12, FOLATE, FERRITIN, TIBC, IRON, RETICCTPCT in the last 72 hours. Sepsis Labs: Recent Labs  Lab 12/16/19 0520  WBC 8.0    Microbiology Recent Results (from the past 240 hour(s))  Aerobic/Anaerobic Culture (surgical/deep wound)     Status: None   Collection Time: 12/11/19  9:57 AM   Specimen: PATH Other; Body Fluid  Result Value Ref Range Status   Specimen Description ABSCESS  Final   Special Requests A BACK ABS  Final   Gram Stain   Final    MODERATE WBC PRESENT,BOTH PMN AND MONONUCLEAR RARE GRAM POSITIVE COCCI    Culture   Final    FEW METHICILLIN RESISTANT STAPHYLOCOCCUS AUREUS NO ANAEROBES ISOLATED Performed at [facility], [address] [place], [place] [address]    Report Status 12/16/2019 FINAL  Final   Organism ID, Bacteria METHICILLIN RESISTANT STAPHYLOCOCCUS AUREUS  Final      Susceptibility   Methicillin resistant staphylococcus aureus - MIC*    CIPROFLOXACIN >=8 RESISTANT Resistant     ERYTHROMYCIN >=8 RESISTANT Resistant     GENTAMICIN <=0.5 SENSITIVE Sensitive     OXACILLIN >=4 RESISTANT Resistant     TETRACYCLINE <=1 SENSITIVE Sensitive     VANCOMYCIN 1 SENSITIVE Sensitive     TRIMETH/SULFA <=10 SENSITIVE Sensitive     CLINDAMYCIN <=0.25 SENSITIVE Sensitive     RIFAMPIN <=0.5 SENSITIVE Sensitive     Inducible Clindamycin NEGATIVE Sensitive     * FEW METHICILLIN RESISTANT STAPHYLOCOCCUS AUREUS    Procedures and diagnostic studies:  No results found.  Medications:   . atorvastatin  80 mg Oral Q2000  . buPROPion  300 mg Oral  Daily  . busPIRone  15 mg Oral TID  . carvedilol  12.5 mg Oral BID WC  . Chlorhexidine Gluconate Cloth  6 each Topical Q0600  . heparin  5,000 Units Subcutaneous Q8H  . hydrocerin   Topical BID  . insulin aspart  0-15 Units Subcutaneous TID WC  . insulin aspart  0-5 Units Subcutaneous QHS  . insulin aspart  15 Units Subcutaneous TID WC  . insulin glargine  40 Units Subcutaneous Daily  . lisinopril  20 mg Oral Daily  . prazosin  2 mg Oral QHS   Continuous Infusions: . doxycycline (VIBRAMYCIN) IV 100 mg (12/19/19 2115)  . lactated ringers 10 mL/hr at 12/18/19 2300  LOS: 16 days   Joseph Art  [facility]   How to contact the [facility] Attending or Consulting provider 7A - 7P or covering provider during after hours 7P -7A, for this patient?  1. Check the care team in [facility] and look for a) attending/consulting TRH provider listed and b) the [facility] team listed 2. Log into [email or web address] and use [place]'s universal password to access. If you do not have the password, please contact the hospital operator. 3. Locate the [facility] provider you are looking for under [facility] and page to a number that you can be directly reached. 4. If you still have difficulty reaching the provider, please page the [facility] (Director on Call) for the Hospitalists listed on amion for assistance.  12/20/2019, 12:00 PM

## 2019-12-20 NOTE — TOC Progression Note (Signed)
Transition of Care [facility]) - Progression Note    Patient Details  Name: Dshaun Reppucci MRN: [id number] Date of Birth: Jul 04, 1960  [facility] CM/SW Contact  Kermit Balo, RN Phone Number: 12/20/2019, 1:35 PM  Clinical Narrative:    Pt's son is not able to come home to assist his father with dressing changes. Since the pt needs dressing changes twice a day he will need a facility for his care. Pt and MD aware.  TOC following and hope to hear from [place] in am.   Expected Discharge Plan: Skilled Nursing Facility (short term rehab) Barriers to Discharge: Other (comment) (Patient requiring daily dressing changes. Home health/ wound center can not provide)  Expected Discharge Plan and Services Expected Discharge Plan: Skilled Nursing Facility (short term rehab) In-house Referral: NA Discharge Planning Services: CM Consult   Living arrangements for the past 2 months: Single Family Home Expected Discharge Date: 12/14/19               DME Arranged: N/A DME Agency: NA       HH Arranged: NA (Patient is active with [facility] but unable to set up [facility] due to need for short term rehab) HH Agency: NA (Patient active with [facility] prior to admission)         Social Determinants of Health (SDOH) Interventions    Readmission Risk Interventions No flowsheet data found.

## 2019-12-21 ENCOUNTER — Ambulatory Visit (INDEPENDENT_AMBULATORY_CARE_PROVIDER_SITE_OTHER): Payer: No Typology Code available for payment source | Admitting: Licensed Clinical Social Worker

## 2019-12-21 ENCOUNTER — Encounter (HOSPITAL_COMMUNITY): Payer: Self-pay | Admitting: Licensed Clinical Social Worker

## 2019-12-21 DIAGNOSIS — F4310 Post-traumatic stress disorder, unspecified: Secondary | ICD-10-CM | POA: Diagnosis not present

## 2019-12-21 DIAGNOSIS — F419 Anxiety disorder, unspecified: Secondary | ICD-10-CM

## 2019-12-21 DIAGNOSIS — F331 Major depressive disorder, recurrent, moderate: Secondary | ICD-10-CM | POA: Diagnosis not present

## 2019-12-21 LAB — GLUCOSE, CAPILLARY
Glucose-Capillary: 256 mg/dL — ABNORMAL HIGH (ref 70–99)
Glucose-Capillary: 179 mg/dL — ABNORMAL HIGH (ref 70–99)
Glucose-Capillary: 230 mg/dL — ABNORMAL HIGH (ref 70–99)
Glucose-Capillary: 252 mg/dL — ABNORMAL HIGH (ref 70–99)
Glucose-Capillary: 140 mg/dL — ABNORMAL HIGH (ref 70–99)
Glucose-Capillary: 86 mg/dL (ref 70–99)
Glucose-Capillary: 218 mg/dL — ABNORMAL HIGH (ref 70–99)
Glucose-Capillary: 160 mg/dL — ABNORMAL HIGH (ref 70–99)
Glucose-Capillary: 114 mg/dL — ABNORMAL HIGH (ref 70–99)
Glucose-Capillary: 103 mg/dL — ABNORMAL HIGH (ref 70–99)
Glucose-Capillary: 74 mg/dL (ref 70–99)
Glucose-Capillary: 107 mg/dL — ABNORMAL HIGH (ref 70–99)
Glucose-Capillary: 149 mg/dL — ABNORMAL HIGH (ref 70–99)
Glucose-Capillary: 125 mg/dL — ABNORMAL HIGH (ref 70–99)
Glucose-Capillary: 82 mg/dL (ref 70–99)

## 2019-12-21 NOTE — Discharge Summary (Signed)
Physician Discharge Summary  Mark [name] IWL:[id number] DOB: 03-27-1960 DOA: 12/04/2019  PCP: [name]  Admit date: 12/04/2019 Discharge date: 12/21/2019  Admitted From: home Discharge disposition: home- patient to arrange wound care via family/friends   Recommendations for Outpatient Follow-Up:   1. Wound care: wet to dry change BID   Discharge Diagnosis:   Principal Problem:   Diabetic ketoacidosis associated with type 2 diabetes mellitus (HCC) Active Problems:   AKI (acute kidney injury) (HCC)   Depression with anxiety   Hypertension associated with diabetes (HCC)   Hyperlipidemia associated with type 2 diabetes mellitus (HCC)   Cellulitis of left lower extremity   Cellulitis of lower back   Pressure injury of skin   Abscess    Discharge Condition: Improved.  Diet recommendation: Low sodium, heart healthy.  Carbohydrate-modified  Code status: Full.   History of Present Illness:   Mark [name] is a 59 y.o. male with medical history significant for insulin-dependent type 2 diabetes, hypertension, hyperlipidemia, right AKA, depression, anxiety, PTSD who presents to the ED for evaluation of shortness of breath and skin wounds.  Patient states he was admitted at [facility] in August 2021 for infection in his back which was surgically managed.  He says 3 days ago he noticed new areas of skin infections in his posterior left thigh and right lower back.  He has been having significant tenderness at the sites.  He has been feeling poorly and says he has not been able to take his medications since this time.  He has had frequent nausea, vomiting, polyuria, polydipsia, and fatigue.  He came to the ED for further evaluation and management.    Hospital Course by Problem:   DKA/DM type 2  -DKA resolved with insulin gtt -on lantus plus novolog- titrate as able -blood sugars improved  Cellulitis/abscess -IV abx per ID- recommend 10 days of  doxycycline- stop date placed -went to the OR on 11/19 for I/D for 2 areas-thigh/back -wound care per general surgery -op cultures: MRSA  Chronic/acute pain -continue home PO  AKI -resolved  HTN -resume home meds  Depression/anxiety -buspar/wellbutrin  obesity Estimated body mass index is 34.73 kg/m as calculated from the following:   Height as of this encounter: 6\' 2"  (1.88 m).   Weight as of this encounter: 122.7 kg.    Medical Consultants:   GS    Discharge Exam:   Vitals:   12/20/19 2002 12/21/19 0911  BP: (!) 144/80 (!) 162/72  Pulse: 80 98  Resp: 18 18  Temp: 98.4 F (36.9 C) 98 F (36.7 C)  SpO2: 100% 100%   Vitals:   12/19/19 2023 12/20/19 0835 12/20/19 2002 12/21/19 0911  BP: (!) 144/65 (!) 148/71 (!) 144/80 (!) 162/72  Pulse: 84 92 80 98  Resp: 18 20 18 18   Temp: 98 F (36.7 C) 97.8 F (36.6 C) 98.4 F (36.9 C) 98 F (36.7 C)  TempSrc: Oral  Oral Oral  SpO2: 100% 99% 100% 100%  Weight:      Height:        General exam: Appears calm and comfortable.   The results of significant diagnostics from this hospitalization (including imaging, microbiology, ancillary and laboratory) are listed below for reference.     Procedures and Diagnostic Studies:   DG Chest Port 1 View  Result Date: 12/04/2019 CLINICAL DATA:  Shortness of breath EXAM: PORTABLE CHEST 1 VIEW COMPARISON:  None. FINDINGS: No focal opacity or pleural effusion. Normal cardiomediastinal  silhouette. No pneumothorax. IMPRESSION: No active disease. Electronically Signed   By: Jasmine PangKim  Fujinaga M.D.   On: 12/04/2019 17:33   US CHEST SOFT TISSUE  Result Date: 12/05/2019 CLINICAL DATA:  Swelling, erythema, and pain in right lower back EXAM: ULTRASOUND OF HEAD/NECK SOFT TISSUES TECHNIQUE: Ultrasound examination of the head and neck soft tissues was performed in the area of clinical concern. COMPARISON:  None. FINDINGS: Sonographic evaluation of the right lower back was performed.  Normal subcutaneous fat is identified. No fluid collections or abscess. IMPRESSION: 1. Unremarkable superficial soft tissues of the right lower back. No fluid collection or abscess. Electronically Signed   By: Sharlet SalinaMichael  Brown M.D.   On: 12/05/2019 15:00     Labs:   Basic Metabolic Panel: Recent Labs  Lab 12/16/19 0520  NA 137  K 4.0  CL 104  CO2 26  GLUCOSE 206*  BUN 17  CREATININE 1.16  CALCIUM 8.6*   GFR Estimated Creatinine Clearance: 95.4 mL/min (by C-G formula based on SCr of 1.16 mg/dL). Liver Function Tests: No results for input(s): AST, ALT, ALKPHOS, BILITOT, PROT, ALBUMIN in the last 168 hours. No results for input(s): LIPASE, AMYLASE in the last 168 hours. No results for input(s): AMMONIA in the last 168 hours. Coagulation profile No results for input(s): INR, PROTIME in the last 168 hours.  CBC: Recent Labs  Lab 12/16/19 0520  WBC 8.0  HGB 9.5*  HCT 30.2*  MCV 83.2  PLT 224   Cardiac Enzymes: No results for input(s): CKTOTAL, CKMB, CKMBINDEX, TROPONINI in the last 168 hours. BNP: Invalid input(s): POCBNP CBG: Recent Labs  Lab 12/20/19 1713 12/20/19 2004 12/20/19 2129 12/21/19 0743 12/21/19 1115  GLUCAP 114* 103* 74 107* 149*   D-Dimer No results for input(s): DDIMER in the last 72 hours. Hgb A1c No results for input(s): HGBA1C in the last 72 hours. Lipid Profile No results for input(s): CHOL, HDL, LDLCALC, TRIG, CHOLHDL, LDLDIRECT in the last 72 hours. Thyroid function studies No results for input(s): TSH, T4TOTAL, T3FREE, THYROIDAB in the last 72 hours.  Invalid input(s): FREET3 Anemia work up No results for input(s): VITAMINB12, FOLATE, FERRITIN, TIBC, IRON, RETICCTPCT in the last 72 hours. Microbiology No results found for this or any previous visit (from the past 240 hour(s)).   Discharge Instructions:   Discharge Instructions    AMB referral to wound care center   Complete by: As directed    Call MD for:  difficulty breathing,  headache or visual disturbances   Complete by: As directed    Call MD for:  persistant dizziness or light-headedness   Complete by: As directed    Call MD for:  persistant nausea and vomiting   Complete by: As directed    Call MD for:  redness, tenderness, or signs of infection (pain, swelling, redness, odor or green/yellow discharge around incision site)   Complete by: As directed    Call MD for:  severe uncontrolled pain   Complete by: As directed    Call MD for:  temperature >100.4   Complete by: As directed    Diet - low sodium heart healthy   Complete by: As directed    Diet Carb Modified   Complete by: As directed    Discharge wound care:   Complete by: As directed    Wet to dry dressing as advised through wound care clinic   Increase activity slowly   Complete by: As directed      Allergies as of 12/21/2019  Reactions   Ozempic (0.25 Or 0.5 Mg-dose) [semaglutide(0.25 Or 0.5mg -dos)] Other (See Comments)   Caused Pancreatitis   Semaglutide Other (See Comments)   Caused pancreatitis   Lyrica [pregabalin] Rash      Medication List    STOP taking these medications   Percocet 5-325 MG tablet Generic drug: oxyCODONE-acetaminophen     TAKE these medications   atorvastatin 80 MG tablet Commonly known as: LIPITOR Take 80 mg by mouth daily.   buPROPion 300 MG 24 hr tablet Commonly known as: WELLBUTRIN XL Take 1 tablet (300 mg total) by mouth daily.   busPIRone 15 MG tablet Commonly known as: BUSPAR Take 1 tablet (15 mg total) by mouth 3 (three) times daily.   carvedilol 12.5 MG tablet Commonly known as: COREG Take 12.5 mg by mouth 2 (two) times daily with a meal.   escitalopram 20 MG tablet Commonly known as: LEXAPRO Take 20 mg by mouth daily.   Hibiclens 4 % external liquid Generic drug: chlorhexidine Apply topically daily as needed.   hydrOXYzine 25 MG capsule Commonly known as: VISTARIL Take 25-50 mg by mouth 2 (two) times daily as needed for  anxiety.   insulin aspart 100 UNIT/ML injection Commonly known as: novoLOG Inject 15 Units into the skin 3 (three) times daily before meals.   LANTUS [place] Inject 25-50 Units into the skin See admin instructions. Taking 50 units in the Am and 25 units at bedtime   lisinopril 20 MG tablet Commonly known as: ZESTRIL Take 20 mg by mouth daily.   mupirocin ointment 2 % Commonly known as: BACTROBAN Place into the nose 2 (two) times daily.   pioglitazone 15 MG tablet Commonly known as: ACTOS Take 15 mg by mouth daily.   prazosin 2 MG capsule Commonly known as: MINIPRESS Take 1 capsule (2 mg total) by mouth at bedtime.   sildenafil 100 MG tablet Commonly known as: VIAGRA Take 100 mg by mouth daily as needed for erectile dysfunction.   traZODone 50 MG tablet Commonly known as: DESYREL Take 1 tablet (50 mg total) by mouth at bedtime as needed. What changed: reasons to take this     ASK your doctor about these medications   oxyCODONE-acetaminophen 5-325 MG tablet Commonly known as: Percocet Take 1-2 tablets by mouth every 6 (six) hours as needed for up to 5 days for severe pain. Ask about: Should I take this medication?            Discharge Care Instructions  (From admission, onward)         Start     Ordered   12/14/19 0000  Discharge wound care:       Comments: Wet to dry dressing as advised through wound care clinic   12/14/19 0829          Follow-up Information    [facility], [place]. Go on 12/28/2019.   Specialty: General Surgery Why: 130pm. Please arrive 30 minutes prior to the appointment for your appointment. Please bring a copy of your photo ID and insurance card.  Contact information: [address] [place] [address] [phone]        [facility]             . Call.   Why: Call and schedule an appointment for assistance with wound care.  Contact information: [address]  [place] [address] [phone]  Time coordinating discharge: 35 min  Signed:  Joseph Art DO  [facility] 12/21/2019, 12:58 PM

## 2019-12-21 NOTE — Progress Notes (Signed)
Progress Note    Mark [name]  SMO:[id number] DOB: May 12, 1960  DOA: 12/04/2019 PCP: [name]    Brief Narrative:     Medical records reviewed and are as summarized below:  Mark [name] is an 59 y.o. male with medical history significant forinsulin-dependent type 2 diabetes, hypertension, hyperlipidemia, right AKA, depression, anxiety, PTSD. Patient presented with DKA Also found to have cellulitis.  Slow to improve so ID consulted as well as GS for I/d in the OR. D/c plan has changed from [place] [facility] to home with home health now uncertain as patient's son is not coming to help him.  Assessment/Plan:   Principal Problem:   Diabetic ketoacidosis associated with type 2 diabetes mellitus (HCC) Active Problems:   AKI (acute kidney injury) (HCC)   Depression with anxiety   Hypertension associated with diabetes (HCC)   Hyperlipidemia associated with type 2 diabetes mellitus (HCC)   Cellulitis of left lower extremity   Cellulitis of lower back   Pressure injury of skin   Abscess    DKA/DM type 2  -DKA resolved with insulin gtt -on lantus plus novolog- titrate as able -blood sugars improved  Cellulitis/abscess -IV abx per ID- recommend 10 days of doxycycline- stop date placed -went to the OR on 11/19 for I/D for 2 areas-thigh/back -wound care per general surgery -op cultures: MRSA  Chronic/acute pain -PRN IV meds with wound care and continue home PO  AKI -resolved  HTN -resume home meds  Depression/anxiety -buspar/wellbutrin  obesity Body mass index is 34.73 kg/m.  Insomnia -trial of trazodone but will need to monitor Qtc (stable as of 11/26)   Family Communication/Anticipated D/C date and plan/Code Status   DVT prophylaxis: heparin Code Status: Full Code.  Disposition Plan: Status is: Inpatient  Remains inpatient appropriate because:Inpatient level of care appropriate due to severity of illness   Dispo: The patient is from: Home               Anticipated d/c is to: uncertain        Medical Consultants:    ID  GS     Subjective:   sleeping  Objective:    Vitals:   12/19/19 2023 12/20/19 0835 12/20/19 2002 12/21/19 0911  BP: (!) 144/65 (!) 148/71 (!) 144/80 (!) 162/72  Pulse: 84 92 80 98  Resp: 18 20 18 18   Temp: 98 F (36.7 C) 97.8 F (36.6 C) 98.4 F (36.9 C) 98 F (36.7 C)  TempSrc: Oral  Oral Oral  SpO2: 100% 99% 100% 100%  Weight:      Height:        Intake/Output Summary (Last 24 hours) at 12/21/2019 1130 Last data filed at 12/21/2019 0900 Gross per 24 hour  Intake --  Output 1000 ml  Net -1000 ml   Filed Weights   12/04/19 1453 12/11/19 0843 12/19/19 0545  Weight: 122 kg 122 kg 122.7 kg    Exam: In bed, snoring respirations, did not awake to light touch  Data Reviewed:   I have personally reviewed following labs and imaging studies:  Labs: Labs show the following:   Basic Metabolic Panel: Recent Labs  Lab 12/16/19 0520  NA 137  K 4.0  CL 104  CO2 26  GLUCOSE 206*  BUN 17  CREATININE 1.16  CALCIUM 8.6*   GFR Estimated Creatinine Clearance: 95.4 mL/min (by C-G formula based on SCr of 1.16 mg/dL). Liver Function Tests: No results for input(s): AST, ALT, ALKPHOS, BILITOT, PROT, ALBUMIN in  the last 168 hours. No results for input(s): LIPASE, AMYLASE in the last 168 hours. No results for input(s): AMMONIA in the last 168 hours. Coagulation profile No results for input(s): INR, PROTIME in the last 168 hours.  CBC: Recent Labs  Lab 12/16/19 0520  WBC 8.0  HGB 9.5*  HCT 30.2*  MCV 83.2  PLT 224   Cardiac Enzymes: No results for input(s): CKTOTAL, CKMB, CKMBINDEX, TROPONINI in the last 168 hours. BNP (last 3 results) No results for input(s): PROBNP in the last 8760 hours. CBG: Recent Labs  Lab 12/20/19 1713 12/20/19 2004 12/20/19 2129 12/21/19 0743 12/21/19 1115  GLUCAP 114* 103* 74 107* 149*   D-Dimer: No results for input(s): DDIMER in the last 72  hours. Hgb A1c: No results for input(s): HGBA1C in the last 72 hours. Lipid Profile: No results for input(s): CHOL, HDL, LDLCALC, TRIG, CHOLHDL, LDLDIRECT in the last 72 hours. Thyroid function studies: No results for input(s): TSH, T4TOTAL, T3FREE, THYROIDAB in the last 72 hours.  Invalid input(s): FREET3 Anemia work up: No results for input(s): VITAMINB12, FOLATE, FERRITIN, TIBC, IRON, RETICCTPCT in the last 72 hours. Sepsis Labs: Recent Labs  Lab 12/16/19 0520  WBC 8.0    Microbiology No results found for this or any previous visit (from the past 240 hour(s)).  Procedures and diagnostic studies:  No results found.  Medications:   . atorvastatin  80 mg Oral Q2000  . buPROPion  300 mg Oral Daily  . busPIRone  15 mg Oral TID  . carvedilol  12.5 mg Oral BID WC  . Chlorhexidine Gluconate Cloth  6 each Topical Daily  . heparin  5,000 Units Subcutaneous Q8H  . hydrocerin   Topical BID  . insulin aspart  0-15 Units Subcutaneous TID WC  . insulin aspart  0-5 Units Subcutaneous QHS  . insulin aspart  15 Units Subcutaneous TID WC  . insulin glargine  40 Units Subcutaneous Daily  . lisinopril  20 mg Oral Daily  . prazosin  2 mg Oral QHS   Continuous Infusions: . doxycycline (VIBRAMYCIN) IV 100 mg (12/21/19 0150)  . lactated ringers 10 mL/hr at 12/18/19 2300     LOS: 17 days   Joseph Art  [facility]   How to contact the [facility] Attending or Consulting provider 7A - 7P or covering provider during after hours 7P -7A, for this patient?  1. Check the care team in [facility] and look for a) attending/consulting [facility] provider listed and b) the [facility] team listed 2. Log into [email or web address] and use [place]'s universal password to access. If you do not have the password, please contact the hospital operator. 3. Locate the [facility] provider you are looking for under [facility] and page to a number that you can be directly reached. 4. If you still have difficulty reaching the  provider, please page the [facility] (Director on Call) for the Hospitalists listed on amion for assistance.  12/21/2019, 11:30 AM

## 2019-12-21 NOTE — Plan of Care (Signed)
  Problem: Clinical Measurements: Goal: Respiratory complications will improve Outcome: Progressing   Problem: Elimination: Goal: Will not experience complications related to urinary retention Outcome: Progressing   Problem: Pain Managment: Goal: General experience of comfort will improve Outcome: Progressing   Problem: Safety: Goal: Ability to remain free from injury will improve Outcome: Progressing   

## 2019-12-21 NOTE — Plan of Care (Signed)
  Problem: Education: Goal: Knowledge of General Education information will improve Description: Including pain rating scale, medication(s)/side effects and non-pharmacologic comfort measures Outcome: Adequate for Discharge   Problem: Health Behavior/Discharge Planning: Goal: Ability to manage health-related needs will improve Outcome: Adequate for Discharge   Problem: Clinical Measurements: Goal: Ability to maintain clinical measurements within normal limits will improve Outcome: Adequate for Discharge Goal: Will remain free from infection Outcome: Adequate for Discharge Goal: Diagnostic test results will improve Outcome: Adequate for Discharge Goal: Respiratory complications will improve Outcome: Adequate for Discharge Goal: Cardiovascular complication will be avoided Outcome: Adequate for Discharge   Problem: Activity: Goal: Risk for activity intolerance will decrease Outcome: Adequate for Discharge   Problem: Nutrition: Goal: Adequate nutrition will be maintained Outcome: Adequate for Discharge   Problem: Coping: Goal: Level of anxiety will decrease Outcome: Adequate for Discharge   Problem: Elimination: Goal: Will not experience complications related to bowel motility Outcome: Adequate for Discharge Goal: Will not experience complications related to urinary retention Outcome: Adequate for Discharge   Problem: Pain Managment: Goal: General experience of comfort will improve Outcome: Adequate for Discharge   Problem: Safety: Goal: Ability to remain free from injury will improve Outcome: Adequate for Discharge   Problem: Skin Integrity: Goal: Risk for impaired skin integrity will decrease Outcome: Adequate for Discharge   Problem: Clinical Measurements: Goal: Ability to avoid or minimize complications of infection will improve Outcome: Adequate for Discharge   Problem: Skin Integrity: Goal: Skin integrity will improve Outcome: Adequate for Discharge   

## 2019-12-21 NOTE — Plan of Care (Signed)
°  Problem: Education: Goal: Knowledge of General Education information will improve Description: Including pain rating scale, medication(s)/side effects and non-pharmacologic comfort measures 12/21/2019 1925 by Charma Igo, LPN Outcome: Adequate for Discharge 12/21/2019 1925 by Charma Igo, LPN Outcome: Adequate for Discharge   Problem: Health Behavior/Discharge Planning: Goal: Ability to manage health-related needs will improve 12/21/2019 1925 by Charma Igo, LPN Outcome: Adequate for Discharge 12/21/2019 1925 by Charma Igo, LPN Outcome: Adequate for Discharge   Problem: Clinical Measurements: Goal: Ability to maintain clinical measurements within normal limits will improve 12/21/2019 1925 by Charma Igo, LPN Outcome: Adequate for Discharge 12/21/2019 1925 by Charma Igo, LPN Outcome: Adequate for Discharge Goal: Will remain free from infection 12/21/2019 1925 by Charma Igo, LPN Outcome: Adequate for Discharge 12/21/2019 1925 by Charma Igo, LPN Outcome: Adequate for Discharge Goal: Diagnostic test results will improve 12/21/2019 1925 by Charma Igo, LPN Outcome: Adequate for Discharge 12/21/2019 1925 by Charma Igo, LPN Outcome: Adequate for Discharge Goal: Respiratory complications will improve 12/21/2019 1925 by Charma Igo, LPN Outcome: Adequate for Discharge 12/21/2019 1925 by Charma Igo, LPN Outcome: Adequate for Discharge Goal: Cardiovascular complication will be avoided 12/21/2019 1925 by Charma Igo, LPN Outcome: Adequate for Discharge 12/21/2019 1925 by Charma Igo, LPN Outcome: Adequate for Discharge   Problem: Activity: Goal: Risk for activity intolerance will decrease 12/21/2019 1925 by Charma Igo, LPN Outcome: Adequate for Discharge 12/21/2019 1925 by Charma Igo, LPN Outcome: Adequate for Discharge   Problem: Nutrition: Goal: Adequate nutrition will be  maintained 12/21/2019 1925 by Charma Igo, LPN Outcome: Adequate for Discharge 12/21/2019 1925 by Charma Igo, LPN Outcome: Adequate for Discharge   Problem: Coping: Goal: Level of anxiety will decrease 12/21/2019 1925 by Charma Igo, LPN Outcome: Adequate for Discharge 12/21/2019 1925 by Charma Igo, LPN Outcome: Adequate for Discharge   Problem: Elimination: Goal: Will not experience complications related to bowel motility 12/21/2019 1925 by Charma Igo, LPN Outcome: Adequate for Discharge 12/21/2019 1925 by Charma Igo, LPN Outcome: Adequate for Discharge Goal: Will not experience complications related to urinary retention 12/21/2019 1925 by Charma Igo, LPN Outcome: Adequate for Discharge 12/21/2019 1925 by Charma Igo, LPN Outcome: Adequate for Discharge   Problem: Pain Managment: Goal: General experience of comfort will improve 12/21/2019 1925 by Charma Igo, LPN Outcome: Adequate for Discharge 12/21/2019 1925 by Charma Igo, LPN Outcome: Adequate for Discharge   Problem: Safety: Goal: Ability to remain free from injury will improve 12/21/2019 1925 by Charma Igo, LPN Outcome: Adequate for Discharge 12/21/2019 1925 by Charma Igo, LPN Outcome: Adequate for Discharge   Problem: Skin Integrity: Goal: Risk for impaired skin integrity will decrease 12/21/2019 1925 by Charma Igo, LPN Outcome: Adequate for Discharge 12/21/2019 1925 by Charma Igo, LPN Outcome: Adequate for Discharge   Problem: Clinical Measurements: Goal: Ability to avoid or minimize complications of infection will improve 12/21/2019 1925 by Charma Igo, LPN Outcome: Adequate for Discharge 12/21/2019 1925 by Charma Igo, LPN Outcome: Adequate for Discharge   Problem: Skin Integrity: Goal: Skin integrity will improve 12/21/2019 1925 by Charma Igo, LPN Outcome: Adequate for Discharge 12/21/2019 1925 by Charma Igo, LPN Outcome: Adequate for Discharge

## 2019-12-21 NOTE — Progress Notes (Signed)
Virtual Visit via Video Note  I connected with Mark [name] on 12/21/19 at 5:00 pm EST by a video enabled telemedicine application and verified that I am speaking with the correct person using two identifiers.   I discussed the limitations of evaluation and management by telemedicine and the availability of in person appointments. The patient expressed understanding and agreed to proceed.  LOCATION: Patient: Hospital Provider: home Office  History of Present Illness: [facility], depression, anxiety.    Observation/Objective:  Patient is currently in the hosptial for Medical stabilization: MRSA, Diabetes, skin wounds, cellulitis, but joined the group session.  Patient participated in a discussion on the benefit of being vulnerable while sharing in group: Ask for what you need, be willing to expose your feelings. Say what you want. Express what you really think. Slow down and be present. Patient was encouraged to show vulnerability while sharing in group.     Assessment and Plan: Counselor will continue to meet with patient address treatment plan goals. Patient recommendations of providers and implement skill.   Follow Up Instructions: I discussed the assessment and treatment plan with the patient. The patient was provided an opportunity to ask questions and all were answered. The patient agreed with the plan and demonstrated an understanding of the instructions.   The patient was advised to call back or seek an in-person evaluation if the symptoms worsen or if the condition fails to improve as anticipated.  I provided 120 minutes of non-face-to-face time during this encounter.   Caterine Mcmeans S, LCAS

## 2019-12-22 ENCOUNTER — Other Ambulatory Visit: Payer: Self-pay

## 2019-12-22 LAB — GLUCOSE, CAPILLARY: Glucose-Capillary: 100 mg/dL — ABNORMAL HIGH (ref 70–99)

## 2019-12-22 MED ORDER — OXYCODONE-ACETAMINOPHEN 5-325 MG PO TABS
1.0000 | ORAL_TABLET | Freq: Four times a day (QID) | ORAL | 0 refills | Status: AC | PRN
Start: 2019-12-22 — End: 2019-12-27

## 2019-12-22 MED ORDER — INSULIN GLARGINE 100 UNIT/ML ~~LOC~~ SOLN
40.0000 [IU] | Freq: Every day | SUBCUTANEOUS | 11 refills | Status: DC
Start: 2019-12-23 — End: 2021-07-25

## 2019-12-22 NOTE — Plan of Care (Signed)
  Problem: Education: Goal: Knowledge of General Education information will improve Description: Including pain rating scale, medication(s)/side effects and non-pharmacologic comfort measures Outcome: Progressing   Problem: Health Behavior/Discharge Planning: Goal: Ability to manage health-related needs will improve Outcome: Progressing   Problem: Pain Managment: Goal: General experience of comfort will improve Outcome: Progressing   Problem: Clinical Measurements: Goal: Ability to avoid or minimize complications of infection will improve Outcome: Progressing   Problem: Skin Integrity: Goal: Skin integrity will improve Outcome: Progressing

## 2019-12-22 NOTE — TOC Transition Note (Signed)
Transition of Care [facility]) - CM/SW Discharge Note   Patient Details  Name: Alice Vitelli MRN: [id number] Date of Birth: [birth year]  Transition of Care [facility] CM/SW Contact:  Beckie Busing, RN Phone Number: [phone]  12/22/2019, 9:03 AM   Clinical Narrative:    Patient is discharging home under his own care. [facility] transportation has been requested. CM has set up 10am transport per patients request. Bedside nurse has been made aware.    Final next level of care: Home/Self Care Barriers to Discharge: No Home Care Agency will accept this patient   Patient Goals and CMS Choice Patient states their goals for this hospitalization and ongoing recovery are:: Patient states that he is ready to go home. CMS Medicare.gov Compare Post Acute Care list provided to:: Patient Choice offered to / list presented to : Patient  Discharge Placement                       Discharge Plan and Services In-house Referral: NA Discharge Planning Services: CM Consult            DME Arranged: N/A DME Agency: NA       HH Arranged: NA HH Agency: NA        Social Determinants of Health (SDOH) Interventions     Readmission Risk Interventions No flowsheet data found.

## 2019-12-22 NOTE — Plan of Care (Signed)
  Problem: Education: Goal: Knowledge of General Education information will improve Description: Including pain rating scale, medication(s)/side effects and non-pharmacologic comfort measures Outcome: Adequate for Discharge   Problem: Health Behavior/Discharge Planning: Goal: Ability to manage health-related needs will improve Outcome: Adequate for Discharge   Problem: Clinical Measurements: Goal: Ability to maintain clinical measurements within normal limits will improve Outcome: Adequate for Discharge Goal: Will remain free from infection Outcome: Adequate for Discharge Goal: Diagnostic test results will improve Outcome: Adequate for Discharge Goal: Respiratory complications will improve Outcome: Adequate for Discharge Goal: Cardiovascular complication will be avoided Outcome: Adequate for Discharge   Problem: Activity: Goal: Risk for activity intolerance will decrease Outcome: Adequate for Discharge   Problem: Nutrition: Goal: Adequate nutrition will be maintained Outcome: Adequate for Discharge   Problem: Coping: Goal: Level of anxiety will decrease Outcome: Adequate for Discharge   Problem: Elimination: Goal: Will not experience complications related to bowel motility Outcome: Adequate for Discharge Goal: Will not experience complications related to urinary retention Outcome: Adequate for Discharge   Problem: Pain Managment: Goal: General experience of comfort will improve Outcome: Adequate for Discharge   Problem: Safety: Goal: Ability to remain free from injury will improve Outcome: Adequate for Discharge   Problem: Skin Integrity: Goal: Risk for impaired skin integrity will decrease Outcome: Adequate for Discharge   Problem: Clinical Measurements: Goal: Ability to avoid or minimize complications of infection will improve Outcome: Adequate for Discharge   Problem: Skin Integrity: Goal: Skin integrity will improve Outcome: Adequate for Discharge   

## 2019-12-22 NOTE — Discharge Summary (Addendum)
Physician Discharge Summary  Mark [name] JGG:[id number] DOB: Jul 03, 1960 DOA: 12/04/2019  PCP: [name]  Admit date: 12/04/2019 Discharge date: 12/22/2019  Admitted From: home Discharge disposition: home- patient to arrange wound care via family/friends/[facility]   Recommendations for Outpatient Follow-Up:   1. Wound care: wet to dry change BID   Discharge Diagnosis:   Principal Problem:   Diabetic ketoacidosis associated with type 2 diabetes mellitus (HCC) Active Problems:   AKI (acute kidney injury) (HCC)   Depression with anxiety   Hypertension associated with diabetes (HCC)   Hyperlipidemia associated with type 2 diabetes mellitus (HCC)   Cellulitis of left lower extremity   Cellulitis of lower back   Pressure injury of skin   Abscess    Discharge Condition: Improved.  Diet recommendation: Low sodium, heart healthy.  Carbohydrate-modified  Code status: Full.   History of Present Illness:   Mark [name] is a 59 y.o. male with medical history significant for insulin-dependent type 2 diabetes, hypertension, hyperlipidemia, right AKA, depression, anxiety, PTSD who presents to the ED for evaluation of shortness of breath and skin wounds.  Patient states he was admitted at [facility] in August 2021 for infection in his back which was surgically managed.  He says 3 days ago he noticed new areas of skin infections in his posterior left thigh and right lower back.  He has been having significant tenderness at the sites.  He has been feeling poorly and says he has not been able to take his medications since this time.  He has had frequent nausea, vomiting, polyuria, polydipsia, and fatigue.  He came to the ED for further evaluation and management.    Hospital Course by Problem:   DKA/DM type 2  -DKA resolved with insulin gtt -on lantus plus novolog- titrate as needed for better control -blood sugars improved  Cellulitis/abscess -IV abx per ID-  recommend 10 days of doxycycline- finished course -went to the OR on 11/19 for I/D for 2 areas-thigh/back -wound care per general surgery -op cultures: MRSA  Chronic/acute pain -continue home PO-have been using IV pain meds in hospital with dressing changes  AKI -resolved  HTN -resume home meds  Depression/anxiety -buspar/wellbutrin  obesity Estimated body mass index is 34.73 kg/m as calculated from the following:   Height as of this encounter: 6\' 2"  (1.88 m).   Weight as of this encounter: 122.7 kg.   Sepsis ruled out   Medical Consultants:   GS ID    Discharge Exam:   Vitals:   12/21/19 2100 12/22/19 0300  BP: 140/78 (!) 150/66  Pulse: 90 81  Resp: 19 20  Temp: (!) 97.5 F (36.4 C) (!) 97.3 F (36.3 C)  SpO2: 100% 100%   Vitals:   12/21/19 0911 12/21/19 1616 12/21/19 2100 12/22/19 0300  BP: (!) 162/72 (!) 148/77 140/78 (!) 150/66  Pulse: 98 84 90 81  Resp: 18 19 19 20   Temp: 98 F (36.7 C) 98.1 F (36.7 C) (!) 97.5 F (36.4 C) (!) 97.3 F (36.3 C)  TempSrc: Oral Oral Oral Oral  SpO2: 100% 100% 100% 100%  Weight:      Height:        General exam: Appears calm and comfortable.   The results of significant diagnostics from this hospitalization (including imaging, microbiology, ancillary and laboratory) are listed below for reference.     Procedures and Diagnostic Studies:   DG Chest Port 1 View  Result Date: 12/04/2019 CLINICAL DATA:  Shortness  of breath EXAM: PORTABLE CHEST 1 VIEW COMPARISON:  None. FINDINGS: No focal opacity or pleural effusion. Normal cardiomediastinal silhouette. No pneumothorax. IMPRESSION: No active disease. Electronically Signed   By: Jasmine Pang M.D.   On: 12/04/2019 17:33   [name] CHEST SOFT TISSUE  Result Date: 12/05/2019 CLINICAL DATA:  Swelling, erythema, and pain in right lower back EXAM: ULTRASOUND OF HEAD/NECK SOFT TISSUES TECHNIQUE: Ultrasound examination of the head and neck soft tissues was performed  in the area of clinical concern. COMPARISON:  None. FINDINGS: Sonographic evaluation of the right lower back was performed. Normal subcutaneous fat is identified. No fluid collections or abscess. IMPRESSION: 1. Unremarkable superficial soft tissues of the right lower back. No fluid collection or abscess. Electronically Signed   By: Sharlet Salina M.D.   On: 12/05/2019 15:00     Labs:   Basic Metabolic Panel: Recent Labs  Lab 12/16/19 0520  NA 137  K 4.0  CL 104  CO2 26  GLUCOSE 206*  BUN 17  CREATININE 1.16  CALCIUM 8.6*   GFR Estimated Creatinine Clearance: 95.4 mL/min (by C-G formula based on SCr of 1.16 mg/dL). Liver Function Tests: No results for input(s): AST, ALT, ALKPHOS, BILITOT, PROT, ALBUMIN in the last 168 hours. No results for input(s): LIPASE, AMYLASE in the last 168 hours. No results for input(s): AMMONIA in the last 168 hours. Coagulation profile No results for input(s): INR, PROTIME in the last 168 hours.  CBC: Recent Labs  Lab 12/16/19 0520  WBC 8.0  HGB 9.5*  HCT 30.2*  MCV 83.2  PLT 224   Cardiac Enzymes: No results for input(s): CKTOTAL, CKMB, CKMBINDEX, TROPONINI in the last 168 hours. BNP: Invalid input(s): POCBNP CBG: Recent Labs  Lab 12/20/19 2129 12/21/19 0743 12/21/19 1115 12/21/19 1615 12/21/19 2212  GLUCAP 74 107* 149* 125* 82   D-Dimer No results for input(s): DDIMER in the last 72 hours. Hgb A1c No results for input(s): HGBA1C in the last 72 hours. Lipid Profile No results for input(s): CHOL, HDL, LDLCALC, TRIG, CHOLHDL, LDLDIRECT in the last 72 hours. Thyroid function studies No results for input(s): TSH, T4TOTAL, T3FREE, THYROIDAB in the last 72 hours.  Invalid input(s): FREET3 Anemia work up No results for input(s): VITAMINB12, FOLATE, FERRITIN, TIBC, IRON, RETICCTPCT in the last 72 hours. Microbiology No results found for this or any previous visit (from the past 240 hour(s)).   Discharge Instructions:   Discharge  Instructions    Call MD for:  difficulty breathing, headache or visual disturbances   Complete by: As directed    Call MD for:  persistant dizziness or light-headedness   Complete by: As directed    Call MD for:  persistant nausea and vomiting   Complete by: As directed    Call MD for:  redness, tenderness, or signs of infection (pain, swelling, redness, odor or green/yellow discharge around incision site)   Complete by: As directed    Call MD for:  severe uncontrolled pain   Complete by: As directed    Call MD for:  temperature >100.4   Complete by: As directed    Diet - low sodium heart healthy   Complete by: As directed    Diet Carb Modified   Complete by: As directed    Discharge wound care:   Complete by: As directed    Wet to dry dressing BID   Increase activity slowly   Complete by: As directed      Allergies as of 12/22/2019  Reactions   Ozempic (0.25 Or 0.5 Mg-dose) [semaglutide(0.25 Or 0.5mg -dos)] Other (See Comments)   Caused Pancreatitis   Semaglutide Other (See Comments)   Caused pancreatitis   Lyrica [pregabalin] Rash      Medication List    TAKE these medications   atorvastatin 80 MG tablet Commonly known as: LIPITOR Take 80 mg by mouth daily.   buPROPion 300 MG 24 hr tablet Commonly known as: WELLBUTRIN XL Take 1 tablet (300 mg total) by mouth daily.   busPIRone 15 MG tablet Commonly known as: BUSPAR Take 1 tablet (15 mg total) by mouth 3 (three) times daily.   carvedilol 12.5 MG tablet Commonly known as: COREG Take 12.5 mg by mouth 2 (two) times daily with a meal.   escitalopram 20 MG tablet Commonly known as: LEXAPRO Take 20 mg by mouth daily.   hydrOXYzine 25 MG capsule Commonly known as: VISTARIL Take 25-50 mg by mouth 2 (two) times daily as needed for anxiety.   insulin aspart 100 UNIT/ML injection Commonly known as: novoLOG Inject 15 Units into the skin 3 (three) times daily before meals.   insulin glargine 100 UNIT/ML  injection Commonly known as: LANTUS Inject 0.4 mLs (40 Units total) into the skin daily. Start taking on: December 23, 2019 What changed:   medication strength  how much to take  when to take this  additional instructions   lisinopril 20 MG tablet Commonly known as: ZESTRIL Take 20 mg by mouth daily.   oxyCODONE-acetaminophen 5-325 MG tablet Commonly known as: Percocet Take 1-2 tablets by mouth every 6 (six) hours as needed for up to 5 days for severe pain. What changed: when to take this   pioglitazone 15 MG tablet Commonly known as: ACTOS Take 15 mg by mouth daily.   prazosin 2 MG capsule Commonly known as: MINIPRESS Take 1 capsule (2 mg total) by mouth at bedtime.   sildenafil 100 MG tablet Commonly known as: VIAGRA Take 100 mg by mouth daily as needed for erectile dysfunction.   traZODone 50 MG tablet Commonly known as: DESYREL Take 1 tablet (50 mg total) by mouth at bedtime as needed. What changed: reasons to take this            Discharge Care Instructions  (From admission, onward)         Start     Ordered   12/22/19 0000  Discharge wound care:       Comments: Wet to dry dressing BID   12/22/19 0912          Follow-up Information    [facility], [facility] [place]. Go on 12/28/2019.   Specialty: General Surgery Why: 130pm. Please arrive 30 minutes prior to the appointment for your appointment. Please bring a copy of your photo ID and insurance card.  Contact information: [address] [place] [address] [phone]        [facility]             . Call.   Why: Call and schedule an appointment for assistance with wound care.  Contact information: [address] [place] [phone]       [facility] Follow up in 1 week(s).   Contact information: [address] [facility] [place] [address] [phone]                 Time coordinating discharge: 35 min  Signed:  Joseph Art DO  [facility] 12/22/2019, 9:14 AM

## 2019-12-23 ENCOUNTER — Other Ambulatory Visit: Payer: Self-pay

## 2019-12-23 NOTE — Patient Outreach (Signed)
[facility]) Care Management  12/23/2019  Mark [name] 1960-05-01 [id number]   Referral Date: 12/22/19 Referral Source: [facility] Liaison Date of Admission: 12/04/19 Diagnosis: Sepsis Date of Discharge:12/22/19 [facility] Insurance: [facility]  Outreach attempt: No answer.  HIPAA compliant voice message left.     Plan: RN CM will attempt patient again within 4 business days and send letter.  [name], RN, MSN [facility] Care Management Care Management Coordinator Direct Line [phone] Toll Free: [phone]  Fax: [phone]

## 2019-12-24 ENCOUNTER — Other Ambulatory Visit: Payer: Self-pay

## 2019-12-24 ENCOUNTER — Encounter (HOSPITAL_COMMUNITY): Payer: Self-pay | Admitting: Licensed Clinical Social Worker

## 2019-12-24 ENCOUNTER — Ambulatory Visit (INDEPENDENT_AMBULATORY_CARE_PROVIDER_SITE_OTHER): Payer: No Typology Code available for payment source | Admitting: Licensed Clinical Social Worker

## 2019-12-24 DIAGNOSIS — F4310 Post-traumatic stress disorder, unspecified: Secondary | ICD-10-CM | POA: Diagnosis not present

## 2019-12-24 DIAGNOSIS — F419 Anxiety disorder, unspecified: Secondary | ICD-10-CM

## 2019-12-24 DIAGNOSIS — F331 Major depressive disorder, recurrent, moderate: Secondary | ICD-10-CM | POA: Diagnosis not present

## 2019-12-24 NOTE — Progress Notes (Signed)
Virtual Visit via Video Note  I connected with Mark [name] on 12/24/19 at 2:00 pm EST by video enabled telemedicine with the correct person using two identifiers.   I discussed the limitations of evaluation and management by telemedicine and the availability of in person appointments. The patient expressed understanding and agreed to proceed.  LOCATION: Patient: hospital Provider: home office    History of Present Illness: Pt was referred to [facility] OP therapy for PTSD, depression and anxiety by the [facility].     Observations/Objective: Verbalize positive and hopeful feelings of the future.  Patient presented WNL  for his individual virtual therapy session. Patient described his psychiatric symptoms and current life events. Patient is currently in the [place] [facility] in [place] for 2 weeks for dressing changes due to living alone. Pt was in the hospital for almost 4 weeks [facility] His health has improved but being monitored: Diabetes, MRSA, Sepsis, Sores on back/thigh, Chronic Pain. Pt's moods have improved even though his depressive symptoms had increased due to his family not helping him. Cln assessed family dynamics and assisted patient in understanding the roles each member of his family take.   Assessment and Plan: Counselor will continue to meet with patient to address treatment plan goals. Patient will continue to follow recommendations of providers and implement skills learned in session.  Follow Up Instructions: I discussed the assessment and treatment plan with the patient. The patient was provided an opportunity to ask questions and all were answered. The patient agreed with the plan and demonstrated an understanding of the instructions.   The patient was advised to call back or seek an in-person evaluation if the symptoms worsen or if the condition fails to improve as anticipated.  I provided 60 minutes of non-face-to-face time during this  encounter.   Daoud Lobue S, LCAS  .

## 2019-12-24 NOTE — Patient Outreach (Signed)
[facility]) Care Management  12/24/2019  Mark [name] [birth year] [id number]   Called placed to patient for follow up on connecting with [facility] Care Management status was pending.  Spoke with patient via phone, states he is admitted to the [facility] hospital and they're working on a wound VAC, he expects to be there a few days and wish for follow up calls next week with him or his wife Mark [name],  [phone] noted and saw now that a [facility] RNCM has attempted a call, will update with information obtained.  Charlesetta Shanks, RN BSN CCM [facility]  [phone] business mobile phone Toll free office [phone]  Fax number: [phone] [email or web address]

## 2019-12-24 NOTE — Patient Outreach (Signed)
[facility]) Care Management  12/24/2019  Mark [name] 1960-08-03 [id number]   Referral Date: 12/22/19 Referral Source: [facility] Liaison Date of Admission: 12/04/19 Diagnosis: Sepsis Date of Discharge:12/22/19 [facility] Insurance: [facility]  Outreach attempt: No answer.  HIPAA compliant voice message left.     Plan: RN CM will attempt patient again within 4 business days.  [name], RN, MSN [facility] Care Management Care Management Coordinator Direct Line [phone] Toll Free: [phone]  Fax: [phone]

## 2019-12-28 ENCOUNTER — Other Ambulatory Visit: Payer: Self-pay

## 2019-12-28 ENCOUNTER — Ambulatory Visit (HOSPITAL_COMMUNITY): Payer: Medicare HMO | Admitting: Licensed Clinical Social Worker

## 2019-12-28 NOTE — Patient Outreach (Signed)
[facility]) Care Management  12/28/2019  Gilmer Kaminsky 1960-02-20 [id number]   Telephone call to Wife Dedra Skeens as requested from patient.  She states patient is still in the [place] hospital right now and maybe going to rehab after as there is more infection than thought.  She requests a call back later in the week for status.    Plan: RN CM will outreach again later this week.   [name], RN, MSN [facility] Care Management Care Management Coordinator Direct Line [phone] Cell [phone] Toll Free: [phone]  Fax: [phone]

## 2019-12-31 ENCOUNTER — Ambulatory Visit (INDEPENDENT_AMBULATORY_CARE_PROVIDER_SITE_OTHER): Payer: No Typology Code available for payment source | Admitting: Licensed Clinical Social Worker

## 2019-12-31 ENCOUNTER — Encounter (HOSPITAL_COMMUNITY): Payer: Self-pay | Admitting: Licensed Clinical Social Worker

## 2019-12-31 ENCOUNTER — Other Ambulatory Visit: Payer: Self-pay

## 2019-12-31 DIAGNOSIS — F4310 Post-traumatic stress disorder, unspecified: Secondary | ICD-10-CM | POA: Diagnosis not present

## 2019-12-31 DIAGNOSIS — F331 Major depressive disorder, recurrent, moderate: Secondary | ICD-10-CM

## 2019-12-31 DIAGNOSIS — F419 Anxiety disorder, unspecified: Secondary | ICD-10-CM

## 2019-12-31 NOTE — Patient Outreach (Signed)
[facility]) Care Management  12/31/2019  Donevin Sainsbury [birth year] [id number]  Telephone call to spouse Dannial Monarch.  She states patient still at the [place] in [place] and does not have a release date yet.    Plan: RN CM will outreach again within the next 7 business days to inquire on patient status.   [name], RN, MSN [facility] Care Management Care Management Coordinator Direct Line [phone] Cell [phone] Toll Free: [phone]  Fax: [phone]

## 2019-12-31 NOTE — Progress Notes (Signed)
Virtual Visit via Video Note  I connected with Mark [name] on 12/31/19 at 2:00 pm EST by video enabled telemedicine with the correct person using two identifiers.   I discussed the limitations of evaluation and management by telemedicine and the availability of in person appointments. The patient expressed understanding and agreed to proceed.  LOCATION[facility] Provider: home office    History of Present Illness: Pt was referred to [facility] OP therapy for PTSD, depression and anxiety by the [facility].     Observations/Objective: Verbalize positive and hopeful feelings of the future.  Patient presented depressed and anxiousWNL  for his individual virtual therapy session. Patient described his psychiatric symptoms and current life events. Patient continues to be in the [place] [facility] in [place] for 2 weeks for dressing changes due to living alone. Pt was in the hospital for almost 4 weeks before [facility]. "I'm depressed and anxious. I am not in the [facility] but in the [facility] at a stand still. They are trying to find Rehab placement for me in the community." Cln used CBT to assist pt with his negative thoughts, challenging them and replacing them with positive thoughts. Cln provided the pt with mindfulness practices to use in the difficult time.   Assessment and Plan: Counselor will continue to meet with patient to address treatment plan goals. Patient will continue to follow recommendations of providers and implement skills learned in session.  Follow Up Instructions: I discussed the assessment and treatment plan with the patient. The patient was provided an opportunity to ask questions and all were answered. The patient agreed with the plan and demonstrated an understanding of the instructions.   The patient was advised to call back or seek an in-person evaluation if the symptoms worsen or if the condition fails to improve as anticipated.  I provided  60 minutes of non-face-to-face time during this encounter.   Akeiba Axelson S, LCAS  .

## 2020-01-04 ENCOUNTER — Encounter (HOSPITAL_COMMUNITY): Payer: Self-pay | Admitting: Licensed Clinical Social Worker

## 2020-01-04 ENCOUNTER — Ambulatory Visit (INDEPENDENT_AMBULATORY_CARE_PROVIDER_SITE_OTHER): Payer: No Typology Code available for payment source | Admitting: Licensed Clinical Social Worker

## 2020-01-04 ENCOUNTER — Other Ambulatory Visit: Payer: Self-pay

## 2020-01-04 DIAGNOSIS — F331 Major depressive disorder, recurrent, moderate: Secondary | ICD-10-CM

## 2020-01-04 DIAGNOSIS — F4310 Post-traumatic stress disorder, unspecified: Secondary | ICD-10-CM

## 2020-01-04 DIAGNOSIS — F419 Anxiety disorder, unspecified: Secondary | ICD-10-CM | POA: Diagnosis not present

## 2020-01-04 NOTE — Progress Notes (Signed)
Virtual Visit via Video Note  I connected with Mark [name] on 01/04/20 at 5:00 pm EST by a video enabled telemedicine application and verified that I am speaking with the correct person using two identifiers.   I discussed the limitations of evaluation and management by telemedicine and the availability of in person appointments. The patient expressed understanding and agreed to proceed.  LOCATION: Patient: Home Provider: home Office  History of Present Illness: [facility], depression, anxiety.    Observation/Objective:  Patient participated in a discussion on sense of self-worth, valuing self, and having a sense of feeling worthy. Patient was encouraged to acknowledge the inner critic and practice positive affirmations  Patient has been discharged from the hospital and is home healing.     Assessment and Plan: Counselor will continue to meet with patient address treatment plan goals. Patient recommendations of providers and implement skill.   Follow Up Instructions: I discussed the assessment and treatment plan with the patient. The patient was provided an opportunity to ask questions and all were answered. The patient agreed with the plan and demonstrated an understanding of the instructions.   The patient was advised to call back or seek an in-person evaluation if the symptoms worsen or if the condition fails to improve as anticipated.  I provided 120 minutes of non-face-to-face time during this encounter.   Lilya Smitherman S, LCAS

## 2020-01-07 ENCOUNTER — Other Ambulatory Visit: Payer: Self-pay

## 2020-01-07 ENCOUNTER — Ambulatory Visit (INDEPENDENT_AMBULATORY_CARE_PROVIDER_SITE_OTHER): Payer: No Typology Code available for payment source | Admitting: Licensed Clinical Social Worker

## 2020-01-07 ENCOUNTER — Encounter (HOSPITAL_COMMUNITY): Payer: Self-pay | Admitting: Licensed Clinical Social Worker

## 2020-01-07 DIAGNOSIS — F331 Major depressive disorder, recurrent, moderate: Secondary | ICD-10-CM | POA: Diagnosis not present

## 2020-01-07 DIAGNOSIS — F4310 Post-traumatic stress disorder, unspecified: Secondary | ICD-10-CM | POA: Diagnosis not present

## 2020-01-07 DIAGNOSIS — F419 Anxiety disorder, unspecified: Secondary | ICD-10-CM | POA: Diagnosis not present

## 2020-01-07 NOTE — Progress Notes (Signed)
Virtual Visit via Video Note  I connected with Mark [name] on 12/31/19 at 2:00 pm EST by video enabled telemedicine with the correct person using two identifiers.   I discussed the limitations of evaluation and management by telemedicine and the availability of in person appointments. The patient expressed understanding and agreed to proceed.  LOCATION: Patient: home Provider: home office    History of Present Illness: Pt was referred to [facility] OP therapy for PTSD, depression and anxiety by the [facility]     Observations/Objective: Verbalize positive and hopeful feelings of the future.  Patient presented depressed and anxious for his individual virtual therapy session. Patient described his psychiatric symptoms and current life events. Patient is back at home after being discharged from the [place] hospital in [place]. His son has come from [place] to assist him with bandage changes 2x daily. Used socratic questions. "I'm not sleeping well, my brain is revolving around everything I need to do." Cln used CBT with patient to assist in changing his thinking.[name] provided psychoeducation on 5 steps of changing his thinking.     Assessment and Plan: Counselor will continue to meet with patient to address treatment plan goals. Patient will continue to follow recommendations of providers and implement skills learned in session.  Follow Up Instructions: I discussed the assessment and treatment plan with the patient. The patient was provided an opportunity to ask questions and all were answered. The patient agreed with the plan and demonstrated an understanding of the instructions.   The patient was advised to call back or seek an in-person evaluation if the symptoms worsen or if the condition fails to improve as anticipated.  I provided 60 minutes of non-face-to-face time during this encounter.   Ivis Henneman S, LCAS  .

## 2020-01-07 NOTE — Patient Outreach (Signed)
[facility]) Care Management  01/07/2020  Trystan Akhtar 1960/05/28 [id number]   Spoke with spouse Crosley Stejskal who states that patient is doing good. She states that patient is home and being cared for by the [place].  She states that the [place] nurses are coming for visits to the patient now at home.  She states that the [place] is where patient goes for all his care and that he went to [facility] previously as he needed care right away.  Discussed [facility] services and support for future reference if patient chooses another source of primary care in the [facility] network.  She verbalized understanding.    Plan: RN CM will close case.    [name], RN, MSN [facility] Care Management Care Management Coordinator Direct Line [phone] Cell [phone] Toll Free: [phone]  Fax: [phone]

## 2020-01-11 ENCOUNTER — Encounter (HOSPITAL_COMMUNITY): Payer: Self-pay | Admitting: Licensed Clinical Social Worker

## 2020-01-11 ENCOUNTER — Ambulatory Visit (INDEPENDENT_AMBULATORY_CARE_PROVIDER_SITE_OTHER): Payer: No Typology Code available for payment source | Admitting: Licensed Clinical Social Worker

## 2020-01-11 DIAGNOSIS — F4310 Post-traumatic stress disorder, unspecified: Secondary | ICD-10-CM | POA: Diagnosis not present

## 2020-01-11 DIAGNOSIS — F331 Major depressive disorder, recurrent, moderate: Secondary | ICD-10-CM

## 2020-01-11 DIAGNOSIS — F419 Anxiety disorder, unspecified: Secondary | ICD-10-CM

## 2020-01-11 NOTE — Progress Notes (Signed)
Virtual Visit via Video Note  I connected with Mark [name] on 01/11/20 at 5:00 pm EST by a video enabled telemedicine application and verified that I am speaking with the correct person using two identifiers.   I discussed the limitations of evaluation and management by telemedicine and the availability of in person appointments. The patient expressed understanding and agreed to proceed.  LOCATION: Patient: Home Provider: home Office  History of Present Illness: [facility], [facility], [facility].               Observation/Objective:    Patient participated in a discussion on Christmas plans, gratitudes and family. Patient shared gratitudes, Christmas plans and possible family scenarios. Patient was encouraged by the group to continue identifying gratitudes and coping skills for possible family scenarios.     Assessment and Plan: Counselor will continue to meet with patient address treatment plan goals. Patient recommendations of providers and implement skill.   Follow Up Instructions: I discussed the assessment and treatment plan with the patient. The patient was provided an opportunity to ask questions and all were answered. The patient agreed with the plan and demonstrated an understanding of the instructions.   The patient was advised to call back or seek an in-person evaluation if the symptoms worsen or if the condition fails to improve as anticipated.  I provided 90 minutes of non-face-to-face time during this encounter.   Cypher Paule S, LCAS

## 2020-01-14 ENCOUNTER — Ambulatory Visit (INDEPENDENT_AMBULATORY_CARE_PROVIDER_SITE_OTHER): Payer: No Typology Code available for payment source | Admitting: Licensed Clinical Social Worker

## 2020-01-14 ENCOUNTER — Other Ambulatory Visit: Payer: Self-pay

## 2020-01-14 ENCOUNTER — Encounter (HOSPITAL_COMMUNITY): Payer: Self-pay | Admitting: Licensed Clinical Social Worker

## 2020-01-14 DIAGNOSIS — F331 Major depressive disorder, recurrent, moderate: Secondary | ICD-10-CM

## 2020-01-14 DIAGNOSIS — F419 Anxiety disorder, unspecified: Secondary | ICD-10-CM

## 2020-01-14 DIAGNOSIS — F4310 Post-traumatic stress disorder, unspecified: Secondary | ICD-10-CM | POA: Diagnosis not present

## 2020-01-14 NOTE — Progress Notes (Signed)
Virtual Visit via Phone Note  I connected with Durrel Mcnee on 01/14/20 at 2:00 pm EST by phone enabled telemedicine with the correct person using two identifiers.   I discussed the limitations of evaluation and management by telemedicine and the availability of in person appointments. The patient expressed understanding and agreed to proceed.  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     Observations/Objective: Verbalize positive and hopeful feelings of the future.  Patient presented anxious for his individual virtual therapy session. Patient described his psychiatric symptoms and current life events. His son continues to assist him with bandage changes 2x daily. Used socratic questions. Patient discussed his Christmas plans involving family, which is increasing his anxiety. Cln suggested coping skills for holiday anxiety and practiced in session. Suggested to patient to identify personal goals for the new year. Cln provided psychoeducation on SMART goals.    Assessment and Plan: Counselor will continue to meet with patient to address treatment plan goals. Patient will continue to follow recommendations of providers and implement skills learned in session.  Follow Up Instructions: I discussed the assessment and treatment plan with the patient. The patient was provided an opportunity to ask questions and all were answered. The patient agreed with the plan and demonstrated an understanding of the instructions.   The patient was advised to call back or seek an in-person evaluation if the symptoms worsen or if the condition fails to improve as anticipated.  I provided 25 minutes of non-face-to-face time during this encounter.   Antowan Samford S, LCAS  .

## 2020-01-18 ENCOUNTER — Ambulatory Visit (HOSPITAL_COMMUNITY): Payer: Medicare HMO | Admitting: Licensed Clinical Social Worker

## 2020-01-25 ENCOUNTER — Ambulatory Visit (INDEPENDENT_AMBULATORY_CARE_PROVIDER_SITE_OTHER): Payer: No Typology Code available for payment source | Admitting: Licensed Clinical Social Worker

## 2020-01-25 ENCOUNTER — Other Ambulatory Visit: Payer: Self-pay

## 2020-01-25 ENCOUNTER — Encounter (HOSPITAL_COMMUNITY): Payer: Self-pay | Admitting: Licensed Clinical Social Worker

## 2020-01-25 DIAGNOSIS — F4310 Post-traumatic stress disorder, unspecified: Secondary | ICD-10-CM | POA: Diagnosis not present

## 2020-01-25 DIAGNOSIS — F419 Anxiety disorder, unspecified: Secondary | ICD-10-CM | POA: Diagnosis not present

## 2020-01-25 DIAGNOSIS — F331 Major depressive disorder, recurrent, moderate: Secondary | ICD-10-CM

## 2020-01-25 NOTE — Progress Notes (Signed)
Virtual Visit via Video Note  I connected with Mark [name] on 01/25/20 at 5:00 pm EST by a video enabled telemedicine application and verified that I am speaking with the correct person using two identifiers.   I discussed the limitations of evaluation and management by telemedicine and the availability of in person appointments. The patient expressed understanding and agreed to proceed.  LOCATION: Patient: Home Provider: home Office  History of Present Illness: [facility], depression, [facility].    Observation/Objective:   Patient participated in a group discussion on "tolerating the holidays" with family members. Patient described his holiday and how he dealt with the feelings surrounding the days. Patient was encouraged to express his feelings in positive ways.      Assessment and Plan: Counselor will continue to meet with patient address treatment plan goals. Patient recommendations of providers and implement skill.   Follow Up Instructions: I discussed the assessment and treatment plan with the patient. The patient was provided an opportunity to ask questions and all were answered. The patient agreed with the plan and demonstrated an understanding of the instructions.   The patient was advised to call back or seek an in-person evaluation if the symptoms worsen or if the condition fails to improve as anticipated.  I provided 120 minutes of non-face-to-face time during this encounter.   Ely Spragg S, LCAS

## 2020-01-28 ENCOUNTER — Other Ambulatory Visit: Payer: Self-pay

## 2020-01-28 ENCOUNTER — Ambulatory Visit (INDEPENDENT_AMBULATORY_CARE_PROVIDER_SITE_OTHER): Payer: No Typology Code available for payment source | Admitting: Licensed Clinical Social Worker

## 2020-01-28 ENCOUNTER — Encounter (HOSPITAL_COMMUNITY): Payer: Self-pay | Admitting: Licensed Clinical Social Worker

## 2020-01-28 DIAGNOSIS — F419 Anxiety disorder, unspecified: Secondary | ICD-10-CM | POA: Diagnosis not present

## 2020-01-28 DIAGNOSIS — F4310 Post-traumatic stress disorder, unspecified: Secondary | ICD-10-CM | POA: Diagnosis not present

## 2020-01-28 DIAGNOSIS — F331 Major depressive disorder, recurrent, moderate: Secondary | ICD-10-CM | POA: Diagnosis not present

## 2020-01-28 NOTE — Progress Notes (Signed)
Virtual Visit via Phone Note  I connected with Mark [name] on 01/28/20 at 10:00am EST by phone enabled telemedicine with the correct person using two identifiers.   I discussed the limitations of evaluation and management by telemedicine and the availability of in person appointments. The patient expressed understanding and agreed to proceed.  LOCATION: Patient: Home Provider: home office    History of Present Illness: Pt was referred to [facility] OP therapy for PTSD, depression and anxiety by the [facility].     Observations/Objective: Verbalize positive and hopeful feelings of the future.  Patient presented anxious for Mark [name] individual virtual therapy session. Patient described Mark [name] psychiatric symptoms and current life events. Patient is currently home so he can get Mark [name] bandages changed by Mark [name] family 2 x daily. Pt reports on the healing of Mark [name] incision after seeing wound dr. And the emotions around the continued chronic wounds. Clinician utilized MI OARS to reflect and summarize thoughts and feelings about this new normal life he is experiencing. Clinician discussed the importance of focusing on the here and now, what he can control.    Mark [name] son continues to assist him with bandage changes 2x daily. Used socratic questions. Patient discussed Mark [name] Christmas plans involving family, which is increasing Mark [name] anxiety. Cln suggested coping skills for holiday anxiety and practiced in session. Suggested to patient to identify personal goals for the new year. Cln provided psychoeducation on SMART goals.    Assessment and Plan: Counselor will continue to meet with patient to address treatment plan goals. Patient will continue to follow recommendations of providers and implement skills learned in session.  Follow Up Instructions: I discussed the assessment and treatment plan with the patient. The patient was provided an opportunity to ask questions and all were answered. The patient agreed with the plan  and demonstrated an understanding of the instructions.   The patient was advised to call back or seek an in-person evaluation if the symptoms worsen or if the condition fails to improve as anticipated.  I provided 45 minutes of non-face-to-face time during this encounter.   Lenoard Helbert S, LCAS  .

## 2020-02-01 ENCOUNTER — Encounter (HOSPITAL_COMMUNITY): Payer: Self-pay | Admitting: Licensed Clinical Social Worker

## 2020-02-01 ENCOUNTER — Other Ambulatory Visit: Payer: Self-pay

## 2020-02-01 ENCOUNTER — Ambulatory Visit (INDEPENDENT_AMBULATORY_CARE_PROVIDER_SITE_OTHER): Payer: No Typology Code available for payment source | Admitting: Licensed Clinical Social Worker

## 2020-02-01 DIAGNOSIS — F4310 Post-traumatic stress disorder, unspecified: Secondary | ICD-10-CM

## 2020-02-01 DIAGNOSIS — F331 Major depressive disorder, recurrent, moderate: Secondary | ICD-10-CM

## 2020-02-01 DIAGNOSIS — F419 Anxiety disorder, unspecified: Secondary | ICD-10-CM

## 2020-02-01 NOTE — Progress Notes (Signed)
Virtual Visit via Video Note  I connected with Mark [name] on 02/01/20 at 5:00 pm EST by a video enabled telemedicine application and verified that I am speaking with the correct person using two identifiers.   I discussed the limitations of evaluation and management by telemedicine and the availability of in person appointments. The patient expressed understanding and agreed to proceed.  LOCATION: Patient: Home Provider: home Office  History of Present Illness: [facility], depression, [facility].    Observation/Objective:   Patient participated in a discussion on emotional regulation skills, helping patient learn to manage his feelings and to better cope with situations.  Patient was encouraged to apply emotional regulation skills and focus on the positive.    Assessment and Plan: Counselor will continue to meet with patient address treatment plan goals. Patient recommendations of providers and implement skill.   Follow Up Instructions: I discussed the assessment and treatment plan with the patient. The patient was provided an opportunity to ask questions and all were answered. The patient agreed with the plan and demonstrated an understanding of the instructions.   The patient was advised to call back or seek an in-person evaluation if the symptoms worsen or if the condition fails to improve as anticipated.  I provided 90 minutes of non-face-to-face time during this encounter.   Tarius Stangelo S, LCAS

## 2020-02-04 ENCOUNTER — Other Ambulatory Visit: Payer: Self-pay

## 2020-02-04 ENCOUNTER — Encounter (HOSPITAL_COMMUNITY): Payer: Self-pay | Admitting: Licensed Clinical Social Worker

## 2020-02-04 ENCOUNTER — Ambulatory Visit (HOSPITAL_COMMUNITY): Payer: No Typology Code available for payment source | Admitting: Licensed Clinical Social Worker

## 2020-02-04 ENCOUNTER — Ambulatory Visit (INDEPENDENT_AMBULATORY_CARE_PROVIDER_SITE_OTHER): Payer: No Typology Code available for payment source | Admitting: Licensed Clinical Social Worker

## 2020-02-04 DIAGNOSIS — F4310 Post-traumatic stress disorder, unspecified: Secondary | ICD-10-CM | POA: Diagnosis not present

## 2020-02-04 DIAGNOSIS — F331 Major depressive disorder, recurrent, moderate: Secondary | ICD-10-CM

## 2020-02-04 DIAGNOSIS — F419 Anxiety disorder, unspecified: Secondary | ICD-10-CM

## 2020-02-04 NOTE — Progress Notes (Addendum)
Virtual Visit via Video Note  I connected with Mark [name] on 02/04/20 at 5:00pm EST by video-enabled telemedicine with the correct person using two identifiers.   I discussed the limitations of evaluation and management by telemedicine and the availability of in person appointments. The patient expressed understanding and agreed to proceed.  LOCATION: Patient: Home Provider: home office    History of Present Illness: Pt was referred to [facility] OP therapy for PTSD, depression and anxiety by the [facility].     Observations/Objective: Verbalize positive and hopeful feelings of the future.  Patient presented anxious for his individual virtual therapy session. Patient described his psychiatric symptoms and current life events. Patient is currently home so he can get his bandages changed by his family 2 x daily. Pt reports on the healing of his incision after seeing wound dr. and the emotions around the continued chronic wounds. Patient discussed his feelings about being back in the house with his family. Clinician utilized CBT to address his thought processes. Clinician provided thought stopping tools, as well as reality testing to provide support and confidence in his decisions.      PLAN: Childhood relationship with mother      Assessment and Plan: Counselor will continue to meet with patient to address treatment plan goals. Patient will continue to follow recommendations of providers and implement skills learned in session.  Follow Up Instructions: I discussed the assessment and treatment plan with the patient. The patient was provided an opportunity to ask questions and all were answered. The patient agreed with the plan and demonstrated an understanding of the instructions.   The patient was advised to call back or seek an in-person evaluation if the symptoms worsen or if the condition fails to improve as anticipated.  I provided 45 minutes of non-face-to-face time during this  encounter.   Crystalyn Delia S, LCAS  .

## 2020-02-08 ENCOUNTER — Encounter (HOSPITAL_COMMUNITY): Payer: Self-pay | Admitting: Licensed Clinical Social Worker

## 2020-02-08 ENCOUNTER — Other Ambulatory Visit: Payer: Self-pay

## 2020-02-08 ENCOUNTER — Ambulatory Visit (INDEPENDENT_AMBULATORY_CARE_PROVIDER_SITE_OTHER): Payer: No Typology Code available for payment source | Admitting: Licensed Clinical Social Worker

## 2020-02-08 DIAGNOSIS — F4310 Post-traumatic stress disorder, unspecified: Secondary | ICD-10-CM | POA: Diagnosis not present

## 2020-02-08 DIAGNOSIS — F331 Major depressive disorder, recurrent, moderate: Secondary | ICD-10-CM

## 2020-02-08 DIAGNOSIS — F419 Anxiety disorder, unspecified: Secondary | ICD-10-CM | POA: Diagnosis not present

## 2020-02-08 NOTE — Progress Notes (Signed)
Virtual Visit via Video Note  I connected with [name] on 02/08/20 at 5:00 pm EST by a video enabled telemedicine application and verified that I am speaking with the correct person using two identifiers.   I discussed the limitations of evaluation and management by telemedicine and the availability of in person appointments. The patient expressed understanding and agreed to proceed.  LOCATION: Patient: Home Provider: home Office  History of Present Illness: [facility], depression, [facility].    Observation/Objective:   Patient participated in a discussion on low frustration tolerance. Clinician provided psychoeducation on low frustration tolerance using "accepts."  Patient was encouraged to work on increasing frustration tolerance.     Assessment and Plan: Counselor will continue to meet with patient address treatment plan goals. Patient recommendations of providers and implement skill.   Follow Up Instructions: I discussed the assessment and treatment plan with the patient. The patient was provided an opportunity to ask questions and all were answered. The patient agreed with the plan and demonstrated an understanding of the instructions.   The patient was advised to call back or seek an in-person evaluation if the symptoms worsen or if the condition fails to improve as anticipated.  I provided 120 minutes of non-face-to-face time during this encounter.   Tyris Eliot S, LCAS

## 2020-02-11 ENCOUNTER — Encounter (HOSPITAL_COMMUNITY): Payer: Self-pay | Admitting: Licensed Clinical Social Worker

## 2020-02-11 ENCOUNTER — Ambulatory Visit (HOSPITAL_COMMUNITY): Payer: No Typology Code available for payment source | Admitting: Licensed Clinical Social Worker

## 2020-02-11 ENCOUNTER — Other Ambulatory Visit: Payer: Self-pay

## 2020-02-11 ENCOUNTER — Ambulatory Visit (INDEPENDENT_AMBULATORY_CARE_PROVIDER_SITE_OTHER): Payer: No Typology Code available for payment source | Admitting: Licensed Clinical Social Worker

## 2020-02-11 DIAGNOSIS — F4310 Post-traumatic stress disorder, unspecified: Secondary | ICD-10-CM

## 2020-02-11 DIAGNOSIS — F419 Anxiety disorder, unspecified: Secondary | ICD-10-CM

## 2020-02-11 DIAGNOSIS — F331 Major depressive disorder, recurrent, moderate: Secondary | ICD-10-CM | POA: Diagnosis not present

## 2020-02-11 NOTE — Progress Notes (Signed)
Virtual Visit via Video Note  I connected with Mark [name] on 02/11/20 at 4:00pm EST by video-enabled telemedicine with the correct person using two identifiers.   I discussed the limitations of evaluation and management by telemedicine and the availability of in person appointments. The patient expressed understanding and agreed to proceed.  LOCATION: Patient: Home Provider: home office    History of Present Illness: Pt was referred to [facility] OP therapy for PTSD, depression and anxiety by the [facility].     Observations/Objective: Verbalize positive and hopeful feelings of the future.  Patient presented anxious for his individual virtual therapy session. Patient described his psychiatric symptoms and current life events. Patient is currently home so he can get his bandages changed by his family 2 x daily. Pt reports on the healing of his incisions. However, now pt has new sores on his backside and has been back to the ED. Patient discussed his thought processes about moving back home.Clinician utilized MI OARS to affirm concerns. Clinician challenged thoughts about this.       PLAN: Childhood relationship with mother      Assessment and Plan: Counselor will continue to meet with patient to address treatment plan goals. Patient will continue to follow recommendations of providers and implement skills learned in session.  Follow Up Instructions: I discussed the assessment and treatment plan with the patient. The patient was provided an opportunity to ask questions and all were answered. The patient agreed with the plan and demonstrated an understanding of the instructions.   The patient was advised to call back or seek an in-person evaluation if the symptoms worsen or if the condition fails to improve as anticipated.  I provided 60 minutes of non-face-to-face time during this encounter.   Nickie Warwick S, LCAS  .

## 2020-02-15 ENCOUNTER — Other Ambulatory Visit: Payer: Self-pay

## 2020-02-15 ENCOUNTER — Ambulatory Visit (INDEPENDENT_AMBULATORY_CARE_PROVIDER_SITE_OTHER): Payer: No Typology Code available for payment source | Admitting: Licensed Clinical Social Worker

## 2020-02-15 ENCOUNTER — Encounter (HOSPITAL_COMMUNITY): Payer: Self-pay | Admitting: Licensed Clinical Social Worker

## 2020-02-15 DIAGNOSIS — F331 Major depressive disorder, recurrent, moderate: Secondary | ICD-10-CM

## 2020-02-15 DIAGNOSIS — F4310 Post-traumatic stress disorder, unspecified: Secondary | ICD-10-CM | POA: Diagnosis not present

## 2020-02-15 DIAGNOSIS — F419 Anxiety disorder, unspecified: Secondary | ICD-10-CM

## 2020-02-15 NOTE — Progress Notes (Signed)
Virtual Visit via Video Note  I connected with Mark [name] on 02/15/20 at 5:00 pm EST by a video enabled telemedicine application and verified that I am speaking with the correct person using two identifiers.   I discussed the limitations of evaluation and management by telemedicine and the availability of in person appointments. The patient expressed understanding and agreed to proceed.  LOCATION: Patient: Home Provider: home Office  History of Present Illness: [facility], depression, [facility].    Observation/Objective:   Patient participated in a discussion on overcoming life's challenges with stress making it more difficult to overcome obstacles. Patient was encouraged to identify life challenges and explore strategies for overcoming obstacles.      Assessment and Plan: Counselor will continue to meet with patient address treatment plan goals. Patient recommendations of providers and implement skill.   Follow Up Instructions: I discussed the assessment and treatment plan with the patient. The patient was provided an opportunity to ask questions and all were answered. The patient agreed with the plan and demonstrated an understanding of the instructions.   The patient was advised to call back or seek an in-person evaluation if the symptoms worsen or if the condition fails to improve as anticipated.  I provided 120 minutes of non-face-to-face time during this encounter.   Cina Klumpp S, LCAS

## 2020-02-18 ENCOUNTER — Other Ambulatory Visit: Payer: Self-pay

## 2020-02-18 ENCOUNTER — Ambulatory Visit (INDEPENDENT_AMBULATORY_CARE_PROVIDER_SITE_OTHER): Payer: No Typology Code available for payment source | Admitting: Licensed Clinical Social Worker

## 2020-02-18 ENCOUNTER — Encounter (HOSPITAL_COMMUNITY): Payer: Self-pay | Admitting: Licensed Clinical Social Worker

## 2020-02-18 ENCOUNTER — Ambulatory Visit (HOSPITAL_COMMUNITY): Payer: No Typology Code available for payment source | Admitting: Licensed Clinical Social Worker

## 2020-02-18 DIAGNOSIS — F4310 Post-traumatic stress disorder, unspecified: Secondary | ICD-10-CM | POA: Diagnosis not present

## 2020-02-18 DIAGNOSIS — F419 Anxiety disorder, unspecified: Secondary | ICD-10-CM | POA: Diagnosis not present

## 2020-02-18 DIAGNOSIS — F331 Major depressive disorder, recurrent, moderate: Secondary | ICD-10-CM

## 2020-02-18 NOTE — Progress Notes (Signed)
Virtual Visit via Video Note  I connected with [name] on 02/18/20 at 4:00pm EST by video-enabled telemedicine with the correct person using two identifiers.   I discussed the limitations of evaluation and management by telemedicine and the availability of in person appointments. The patient expressed understanding and agreed to proceed.  LOCATION: Patient: Home Provider: home office    History of Present Illness: Pt was referred to [facility] OP therapy for PTSD, depression and anxiety by the [facility].     Observations/Objective: Verbalize positive and hopeful feelings of the future.  Patient presented anxious for his individual virtual therapy session. Patient described his psychiatric symptoms and current life events. Patient is currently home so he can get his bandages changed by his family 2 x daily, but will be going home this weekend. Pt reports his anxiety symptoms have increased since he's been back home. Cln assisted patient with identifying his current conflicts that have increased his anxiety symptoms. Cln provided appropriate psychoeducation on relaxation and diversion activities to help decrease his level of anxiety. Encouraged patient to use these activities in his daily life between sessions.           PLAN: Childhood relationship with mother      Assessment and Plan: Counselor will continue to meet with patient to address treatment plan goals. Patient will continue to follow recommendations of providers and implement skills learned in session.  Follow Up Instructions: I discussed the assessment and treatment plan with the patient. The patient was provided an opportunity to ask questions and all were answered. The patient agreed with the plan and demonstrated an understanding of the instructions.   The patient was advised to call back or seek an in-person evaluation if the symptoms worsen or if the condition fails to improve as anticipated.  I provided 60  minutes of non-face-to-face time during this encounter.   Jaylon Grode S, LCAS  .

## 2020-02-22 ENCOUNTER — Other Ambulatory Visit: Payer: Self-pay

## 2020-02-22 ENCOUNTER — Ambulatory Visit (INDEPENDENT_AMBULATORY_CARE_PROVIDER_SITE_OTHER): Payer: No Typology Code available for payment source | Admitting: Licensed Clinical Social Worker

## 2020-02-22 ENCOUNTER — Encounter (HOSPITAL_COMMUNITY): Payer: Self-pay | Admitting: Licensed Clinical Social Worker

## 2020-02-22 DIAGNOSIS — F419 Anxiety disorder, unspecified: Secondary | ICD-10-CM

## 2020-02-22 DIAGNOSIS — F4310 Post-traumatic stress disorder, unspecified: Secondary | ICD-10-CM | POA: Diagnosis not present

## 2020-02-22 DIAGNOSIS — F331 Major depressive disorder, recurrent, moderate: Secondary | ICD-10-CM

## 2020-02-22 NOTE — Progress Notes (Signed)
Virtual Visit via Video Note  I connected with [name] on 02/22/20 at 5:00 pm EST by a video enabled telemedicine application and verified that I am speaking with the correct person using two identifiers.   I discussed the limitations of evaluation and management by telemedicine and the availability of in person appointments. The patient expressed understanding and agreed to proceed.  LOCATION: Patient: Home Provider: home Office  History of Present Illness: [facility], depression, anxiety.    Observation/Objective:   Patient participated in a discussion on shaping the lens through which the brain views the world, that shapes reality. Discussed changing the lens, which can change the world of personal happiness. Patient was encourage to changing the lens to find personal happiness.    Assessment and Plan: Counselor will continue to meet with patient address treatment plan goals. Patient recommendations of providers and implement skill.   Follow Up Instructions: I discussed the assessment and treatment plan with the patient. The patient was provided an opportunity to ask questions and all were answered. The patient agreed with the plan and demonstrated an understanding of the instructions.   The patient was advised to call back or seek an in-person evaluation if the symptoms worsen or if the condition fails to improve as anticipated.  I provided 120 minutes of non-face-to-face time during this encounter.   Lilymarie Scroggins S, LCAS

## 2020-02-25 ENCOUNTER — Ambulatory Visit (INDEPENDENT_AMBULATORY_CARE_PROVIDER_SITE_OTHER): Payer: No Typology Code available for payment source | Admitting: Licensed Clinical Social Worker

## 2020-02-25 ENCOUNTER — Other Ambulatory Visit: Payer: Self-pay

## 2020-02-25 ENCOUNTER — Ambulatory Visit (HOSPITAL_COMMUNITY): Payer: No Typology Code available for payment source | Admitting: Licensed Clinical Social Worker

## 2020-02-25 DIAGNOSIS — F4310 Post-traumatic stress disorder, unspecified: Secondary | ICD-10-CM | POA: Diagnosis not present

## 2020-02-25 DIAGNOSIS — F419 Anxiety disorder, unspecified: Secondary | ICD-10-CM

## 2020-02-25 DIAGNOSIS — F331 Major depressive disorder, recurrent, moderate: Secondary | ICD-10-CM | POA: Diagnosis not present

## 2020-02-29 ENCOUNTER — Encounter (HOSPITAL_COMMUNITY): Payer: Self-pay | Admitting: Licensed Clinical Social Worker

## 2020-02-29 ENCOUNTER — Other Ambulatory Visit: Payer: Self-pay

## 2020-02-29 ENCOUNTER — Ambulatory Visit (INDEPENDENT_AMBULATORY_CARE_PROVIDER_SITE_OTHER): Payer: No Typology Code available for payment source | Admitting: Licensed Clinical Social Worker

## 2020-02-29 DIAGNOSIS — F331 Major depressive disorder, recurrent, moderate: Secondary | ICD-10-CM

## 2020-02-29 DIAGNOSIS — F4310 Post-traumatic stress disorder, unspecified: Secondary | ICD-10-CM

## 2020-02-29 DIAGNOSIS — F419 Anxiety disorder, unspecified: Secondary | ICD-10-CM | POA: Diagnosis not present

## 2020-02-29 NOTE — Progress Notes (Signed)
Virtual Visit via Video Note  I connected with [name] on 02/29/20 at 5:00 pm EST by a video enabled telemedicine application and verified that I am speaking with the correct person using two identifiers.   I discussed the limitations of evaluation and management by telemedicine and the availability of in person appointments. The patient expressed understanding and agreed to proceed.  LOCATION: Patient: Home Provider: home Office  History of Present Illness: [facility], depression, anxiety.    Observation/Objective:      Patient participated in a   discussion on the benefit   of being vulnerable while   sharing in group: Ask for   what you need, be willing t  o expose your feelings,   say what you want,   express your emotions   and be present. Patient   was encouraged to show   vulnerability while   sharing in group.  Assessment and Plan: Counselor will continue to meet with patient address treatment plan goals. Patient recommendations of providers and implement skill.   Follow Up Instructions: I discussed the assessment and treatment plan with the patient. The patient was provided an opportunity to ask questions and all were answered. The patient agreed with the plan and demonstrated an understanding of the instructions.   The patient was advised to call back or seek an in-person evaluation if the symptoms worsen or if the condition fails to improve as anticipated.  I provided 120 minutes of non-face-to-face time during this encounter.   Hanalei Glace S, LCAS

## 2020-02-29 NOTE — Progress Notes (Signed)
Virtual Visit via Video Note  I connected with Mark [name] on 02/24/20 at 4:00pm EST by video-enabled telemedicine with the correct person using two identifiers.   I discussed the limitations of evaluation and management by telemedicine and the availability of in person appointments. The patient expressed understanding and agreed to proceed.  LOCATION: Patient: Home Provider: home office    History of Present Illness: Pt was referred to [facility] OP therapy for PTSD, depression and anxiety by the [facility].     Observations/Objective: Verbalize positive and hopeful feelings of the future.  Patient presented anxious and depressed for his individual virtual therapy session. Patient described his psychiatric symptoms and current life events. Patient continues to be home for health reasons but will leave within the week.  Pt reports his anxiety and depressive symptoms continue to  Increase due to family conflicts and his inability to resolve them in a positive manner. Cln provided psychoeducation on conflict resolutions. Cln and pt role played conflict resolution and processed ways to use this in every day family conflicts.           PLAN: Childhood relationship with mother      Assessment and Plan: Counselor will continue to meet with patient to address treatment plan goals. Patient will continue to follow recommendations of providers and implement skills learned in session.  Follow Up Instructions: I discussed the assessment and treatment plan with the patient. The patient was provided an opportunity to ask questions and all were answered. The patient agreed with the plan and demonstrated an understanding of the instructions.   The patient was advised to call back or seek an in-person evaluation if the symptoms worsen or if the condition fails to improve as anticipated.  I provided 60 minutes of non-face-to-face time during this encounter.   MACKENZIE,LISBETH S, LCAS  .

## 2020-03-03 ENCOUNTER — Ambulatory Visit (INDEPENDENT_AMBULATORY_CARE_PROVIDER_SITE_OTHER): Payer: No Typology Code available for payment source | Admitting: Licensed Clinical Social Worker

## 2020-03-03 ENCOUNTER — Ambulatory Visit (HOSPITAL_COMMUNITY): Payer: No Typology Code available for payment source | Admitting: Licensed Clinical Social Worker

## 2020-03-03 ENCOUNTER — Other Ambulatory Visit: Payer: Self-pay

## 2020-03-03 DIAGNOSIS — F4310 Post-traumatic stress disorder, unspecified: Secondary | ICD-10-CM | POA: Diagnosis not present

## 2020-03-03 DIAGNOSIS — F419 Anxiety disorder, unspecified: Secondary | ICD-10-CM | POA: Diagnosis not present

## 2020-03-03 DIAGNOSIS — F331 Major depressive disorder, recurrent, moderate: Secondary | ICD-10-CM

## 2020-03-07 ENCOUNTER — Ambulatory Visit (INDEPENDENT_AMBULATORY_CARE_PROVIDER_SITE_OTHER): Payer: No Typology Code available for payment source | Admitting: Licensed Clinical Social Worker

## 2020-03-07 ENCOUNTER — Other Ambulatory Visit: Payer: Self-pay

## 2020-03-07 DIAGNOSIS — F4310 Post-traumatic stress disorder, unspecified: Secondary | ICD-10-CM

## 2020-03-07 DIAGNOSIS — F419 Anxiety disorder, unspecified: Secondary | ICD-10-CM | POA: Diagnosis not present

## 2020-03-07 DIAGNOSIS — F331 Major depressive disorder, recurrent, moderate: Secondary | ICD-10-CM

## 2020-03-09 ENCOUNTER — Encounter (HOSPITAL_COMMUNITY): Payer: Self-pay | Admitting: Licensed Clinical Social Worker

## 2020-03-09 NOTE — Progress Notes (Signed)
Virtual Visit via Video Note  I connected with Mark [name] on 03/03/20 at 4:00pm EST by video-enabled telemedicine with the correct person using two identifiers.   I discussed the limitations of evaluation and management by telemedicine and the availability of in person appointments. The patient expressed understanding and agreed to proceed.  LOCATION: Patient: Home Provider: home office    History of Present Illness: Pt was referred to [facility] OP therapy for PTSD, depression and anxiety by the [facility].   Treatment Goal: Verbalize positive and hopeful feelings of the future.      Observations/Objective: Patient presented for today's session on time and was alert, oriented x5, with no evidence or self-report of SI/HI or A/V H.  Patient reported ongoing compliance with medication. Clinician inquired about patient's current emotional ratings, as well as any significant changes in thoughts, feelings or behavior since previous session.  Patient reported scores of 4/10 for depression, 4/10 for anxiety, 3/10 for anger/irritability. Cln explored with pt reasons for mixed moods. Pt shared he is still  experiencing turmoil within the family unit. Cln reminded pt this turmoil is on-going. Cln questioned "What are you willing to do different?" Cln provided psyhoeducation for self soothing. Pt practiced self soothing in session. Cln suggested patient  Practiced self soothing between sessions. Cln will continue to monitor ratings.           PLAN: Childhood relationship with mother      Assessment and Plan: Counselor will continue to meet with patient to address treatment plan goals. Patient will continue to follow recommendations of providers and implement skills learned in session.  Follow Up Instructions: I discussed the assessment and treatment plan with the patient. The patient was provided an opportunity to ask questions and all were answered. The patient agreed with the plan and  demonstrated an understanding of the instructions.   The patient was advised to call back or seek an in-person evaluation if the symptoms worsen or if the condition fails to improve as anticipated.  I provided 60 minutes of non-face-to-face time during this encounter.   Merit Gadsby S, LCAS  .

## 2020-03-10 ENCOUNTER — Other Ambulatory Visit: Payer: Self-pay

## 2020-03-10 ENCOUNTER — Ambulatory Visit (HOSPITAL_COMMUNITY): Payer: No Typology Code available for payment source | Admitting: Licensed Clinical Social Worker

## 2020-03-10 ENCOUNTER — Ambulatory Visit (INDEPENDENT_AMBULATORY_CARE_PROVIDER_SITE_OTHER): Payer: No Typology Code available for payment source | Admitting: Licensed Clinical Social Worker

## 2020-03-10 ENCOUNTER — Encounter (HOSPITAL_COMMUNITY): Payer: Self-pay | Admitting: Licensed Clinical Social Worker

## 2020-03-10 DIAGNOSIS — F331 Major depressive disorder, recurrent, moderate: Secondary | ICD-10-CM | POA: Diagnosis not present

## 2020-03-10 DIAGNOSIS — F4310 Post-traumatic stress disorder, unspecified: Secondary | ICD-10-CM | POA: Diagnosis not present

## 2020-03-10 DIAGNOSIS — F419 Anxiety disorder, unspecified: Secondary | ICD-10-CM | POA: Diagnosis not present

## 2020-03-10 NOTE — Progress Notes (Signed)
Virtual Visit via Video Note  I connected with Mark [name] on 03/07/20 at 5:00 pm EST by a video enabled telemedicine application and verified that I am speaking with the correct person using two identifiers.   I discussed the limitations of evaluation and management by telemedicine and the availability of in person appointments. The patient expressed understanding and agreed to proceed.  LOCATION: Patient: Home Provider: home Office  History of Present Illness: [facility], [facility], [facility].    Observation/Objective:     Patient engaged in discussion on identified positives to assess how they have impacted mood. Clinician allowed space for group to further process emotions and clinician provided supportive statements throughout session for each patient. It was suggested to patient to continue to identify positives that have impacted mood.   Assessment and Plan: Counselor will continue to meet with patient address treatment plan goals. Patient recommendations of providers and implement skill.   Follow Up Instructions Patient engaged in discussion on identified positives to assess how they have impacted mood. Clinician allowed space for group to further process emotions and clinician provided supportive statements throughout session for each patient. It was suggested to patient to continue to identify positives that have impacted mood.   Follow-up instructions: I discussed the assessment and treatment plan with the patient. The patient was provided an opportunity to ask questions and all were answered. The patient agreed with the plan and demonstrated an understanding of the instructions.   The patient was advised to call back or seek an in-person evaluation if the symptoms worsen or if the condition fails to improve as anticipated.  I provided 90 minutes of non-face-to-face time during this encounter.   Saran Laviolette S,  LCAS

## 2020-03-10 NOTE — Progress Notes (Signed)
Virtual Visit via Video Note  I connected with Mark [name] on 03/10/20 at 4:00pm EST by video-enabled telemedicine with the correct person using two identifiers.   I discussed the limitations of evaluation and management by telemedicine and the availability of in person appointments. The patient expressed understanding and agreed to proceed.  LOCATION: Patient: Home Provider: home office    History of Present Illness: Pt was referred to [facility] OP therapy for PTSD, depression and anxiety by the [facility].   Treatment Goal: Verbalize positive and hopeful feelings of the future.      Observations/Objective: Patient presented for today's session on time and was alert, oriented x5, with no evidence or self-report of SI/HI or A/V H.  Patient reported ongoing compliance with medication. Clinician inquired about patient's current emotional ratings, as well as any significant changes in thoughts, feelings or behavior since previous session.  Patient reported scores of 4/10 for depression, 4/10 for anxiety, 3/10 for anger/irritability. Cln explored with pt reasons for mixed moods. Used Socratic questions. Cln completed PHQ9 and pain assessment. Reviewed each question/response with pt. Pt shared he is still  experiencing turmoil within the family unit, which depresses him and makes him angry and irritable. Pt relayed that he notices good moments and 'really bad ones that flare his anxiety' and identified conflict with loved ones as a significant trigger. Pt engaged in discussion on communication styles and was receptive to feedback regarding avoidance of using language that is accusatory and instead communicating feelings/needs by utilizing 'I Statements' and "asking for an appointment for discussion". Cln provided specific scenarios in which this could be helpful and agreed to attempt utilization in upcoming family communication.Cln will continue to monitor.              PLAN: Childhood  relationship with mother      Assessment and Plan: Counselor will continue to meet with patient to address treatment plan goals. Patient will continue to follow recommendations of providers and implement skills learned in session.  Follow Up Instructions: I discussed the assessment and treatment plan with the patient. The patient was provided an opportunity to ask questions and all were answered. The patient agreed with the plan and demonstrated an understanding of the instructions.   The patient was advised to call back or seek an in-person evaluation if the symptoms worsen or if the condition fails to improve as anticipated.  I provided 60 minutes of non-face-to-face time during this encounter.   Temperance Kelemen S, LCAS  .

## 2020-03-14 ENCOUNTER — Encounter (HOSPITAL_COMMUNITY): Payer: Self-pay | Admitting: Licensed Clinical Social Worker

## 2020-03-14 ENCOUNTER — Ambulatory Visit (INDEPENDENT_AMBULATORY_CARE_PROVIDER_SITE_OTHER): Payer: No Typology Code available for payment source | Admitting: Licensed Clinical Social Worker

## 2020-03-14 ENCOUNTER — Other Ambulatory Visit: Payer: Self-pay

## 2020-03-14 DIAGNOSIS — F4310 Post-traumatic stress disorder, unspecified: Secondary | ICD-10-CM

## 2020-03-14 DIAGNOSIS — F419 Anxiety disorder, unspecified: Secondary | ICD-10-CM | POA: Diagnosis not present

## 2020-03-14 DIAGNOSIS — F331 Major depressive disorder, recurrent, moderate: Secondary | ICD-10-CM | POA: Diagnosis not present

## 2020-03-14 NOTE — Progress Notes (Signed)
Virtual Visit via Video Note  I connected with [name] on 03/14/20 at 5:00 pm EST by a video enabled telemedicine application and verified that I am speaking with the correct person using two identifiers.   I discussed the limitations of evaluation and management by telemedicine and the availability of in person appointments. The patient expressed understanding and agreed to proceed.  LOCATION: Patient: Home Provider: home Office  History of Present Illness: [facility], depression, [facility].    Observation/Objective:      Patient participated in a discussion on overcoming life's challenges with stress making it more difficult to overcome obstacles. Patient was encouraged to identify life challenges and explore strategies for overcoming obstacles.    Assessment and Plan: Counselor will continue to meet with patient address treatment plan goals. Patient recommendations of providers and implement skill.   Follow Up Instructions Patient engaged in discussion on identified positives to assess how they have impacted mood. Clinician allowed space for group to further process emotions and clinician provided supportive statements throughout session for each patient. It was suggested to patient to continue to identify positives that have impacted mood.   Follow-up instructions: I discussed the assessment and treatment plan with the patient. The patient was provided an opportunity to ask questions and all were answered. The patient agreed with the plan and demonstrated an understanding of the instructions.   The patient was advised to call back or seek an in-person evaluation if the symptoms worsen or if the condition fails to improve as anticipated.  I provided 90 minutes of non-face-to-face time during this encounter.   Eesa Justiss S, LCAS

## 2020-03-16 ENCOUNTER — Encounter (HOSPITAL_COMMUNITY): Payer: Self-pay | Admitting: Licensed Clinical Social Worker

## 2020-03-17 ENCOUNTER — Ambulatory Visit (HOSPITAL_COMMUNITY): Payer: No Typology Code available for payment source | Admitting: Licensed Clinical Social Worker

## 2020-03-17 ENCOUNTER — Other Ambulatory Visit: Payer: Self-pay

## 2020-03-17 ENCOUNTER — Ambulatory Visit (INDEPENDENT_AMBULATORY_CARE_PROVIDER_SITE_OTHER): Payer: No Typology Code available for payment source | Admitting: Licensed Clinical Social Worker

## 2020-03-17 DIAGNOSIS — F331 Major depressive disorder, recurrent, moderate: Secondary | ICD-10-CM

## 2020-03-17 DIAGNOSIS — F419 Anxiety disorder, unspecified: Secondary | ICD-10-CM

## 2020-03-17 DIAGNOSIS — F4310 Post-traumatic stress disorder, unspecified: Secondary | ICD-10-CM

## 2020-03-21 ENCOUNTER — Other Ambulatory Visit: Payer: Self-pay

## 2020-03-21 ENCOUNTER — Encounter (HOSPITAL_COMMUNITY): Payer: Self-pay | Admitting: Licensed Clinical Social Worker

## 2020-03-21 ENCOUNTER — Ambulatory Visit (INDEPENDENT_AMBULATORY_CARE_PROVIDER_SITE_OTHER): Payer: No Typology Code available for payment source | Admitting: Licensed Clinical Social Worker

## 2020-03-21 DIAGNOSIS — F4310 Post-traumatic stress disorder, unspecified: Secondary | ICD-10-CM

## 2020-03-21 DIAGNOSIS — F331 Major depressive disorder, recurrent, moderate: Secondary | ICD-10-CM

## 2020-03-21 DIAGNOSIS — F419 Anxiety disorder, unspecified: Secondary | ICD-10-CM

## 2020-03-21 NOTE — Progress Notes (Signed)
Virtual Visit via Video Note  I connected with Mark [name] on 03/10/20 at 4:00pm EST by video-enabled telemedicine with the correct person using two identifiers.   I discussed the limitations of evaluation and management by telemedicine and the availability of in person appointments. The patient expressed understanding and agreed to proceed.  LOCATION: Patient: Home Provider: home office    History of Present Illness: Pt was referred to [facility] OP therapy for PTSD, depression and anxiety by the [facility].   Treatment Goal: Verbalize positive and hopeful feelings of the future.      Observations/Objective: Patient presented for today's session on time and was alert, oriented x5, with no evidence or self-report of SI/HI or A/V H.  Patient reported ongoing compliance with medication. Clinician inquired about patient's current emotional ratings, as well as any significant changes in thoughts, feelings or behavior since previous session.  Patient reported scores of 4/10 for depression, 4/10 for anxiety, 3/10 for anger/irritability. Cln explored with pt reasons for mixed moods. Used Socratic questions. Cln completed the C-SSRS, where there is no risk of suicidally. Cln explored each question/answer of the scale. Pt reports on the birth of his first grandchild and how that has brought some "temporary happiness" into his life. Cln suggested "happiness factor podcast." Cln will continue to monitor his moods. At next session cln will complete more screenings.              PLAN: Childhood relationship with mother      Assessment and Plan: Counselor will continue to meet with patient to address treatment plan goals. Patient will continue to follow recommendations of providers and implement skills learned in session.  Follow Up Instructions: I discussed the assessment and treatment plan with the patient. The patient was provided an opportunity to ask questions and all were answered. The  patient agreed with the plan and demonstrated an understanding of the instructions.   The patient was advised to call back or seek an in-person evaluation if the symptoms worsen or if the condition fails to improve as anticipated.  I provided 60 minutes of non-face-to-face time during this encounter.   Nakeia Calvi S, LCAS  .

## 2020-03-21 NOTE — Progress Notes (Signed)
Virtual Visit via Video Note  I connected with [name] on 03/21/20 at 5:00 pm EST by a video enabled telemedicine application and verified that I am speaking with the correct person using two identifiers.   I discussed the limitations of evaluation and management by telemedicine and the availability of in person appointments. The patient expressed understanding and agreed to proceed.  LOCATION: Patient: Home Provider: home Office  History of Present Illness: [facility], depression, [facility].    Observation/Objective:     Patient participated in a discussion on reducing stress to improve mental health, by using EFT (Emotional Freedom Technique). The basic tenets of EFT were taught in the group, while patient participated in the technique. Patient was allowed to ask questions about the principles of the healing technique. Patient practiced during the session and was encouraged to continue using EFT between session to feel address the root cause of distress to feel more relaxed and in control.    Assessment and Plan: Counselor will continue to meet with patient address treatment plan goals. Patient recommendations of providers and implement skill.   Follow Up Instructions Patient engaged in discussion on identified positives to assess how they have impacted mood. Clinician allowed space for group to further process emotions and clinician provided supportive statements throughout session for each patient. It was suggested to patient to continue to identify positives that have impacted mood.   Follow-up instructions: I discussed the assessment and treatment plan with the patient. The patient was provided an opportunity to ask questions and all were answered. The patient agreed with the plan and demonstrated an understanding of the instructions.   The patient was advised to call back or seek an in-person evaluation if the symptoms worsen or  if the condition fails to improve as anticipated.  I provided 120 minutes of non-face-to-face time during this encounter.   Linden Tagliaferro S, LCAS

## 2020-03-24 ENCOUNTER — Encounter (HOSPITAL_COMMUNITY): Payer: Self-pay | Admitting: Licensed Clinical Social Worker

## 2020-03-24 ENCOUNTER — Other Ambulatory Visit: Payer: Self-pay

## 2020-03-24 ENCOUNTER — Ambulatory Visit (INDEPENDENT_AMBULATORY_CARE_PROVIDER_SITE_OTHER): Payer: No Typology Code available for payment source | Admitting: Licensed Clinical Social Worker

## 2020-03-24 DIAGNOSIS — F331 Major depressive disorder, recurrent, moderate: Secondary | ICD-10-CM | POA: Diagnosis not present

## 2020-03-24 DIAGNOSIS — F4310 Post-traumatic stress disorder, unspecified: Secondary | ICD-10-CM

## 2020-03-24 DIAGNOSIS — F419 Anxiety disorder, unspecified: Secondary | ICD-10-CM | POA: Diagnosis not present

## 2020-03-24 NOTE — Progress Notes (Signed)
Virtual Visit via Video Note  I connected with Mark [name] on 03/24/20 at 4:30pm EST by video-enabled telemedicine with the correct person using two identifiers.   I discussed the limitations of evaluation and management by telemedicine and the availability of in person appointments. The patient expressed understanding and agreed to proceed.  LOCATION: Patient: Home Provider: home office    History of Present Illness: Pt was referred to [facility] OP therapy for PTSD, depression and anxiety by the [facility].   Treatment Goal: Verbalize positive and hopeful feelings of the future.      Observations/Objective: Patient presented for today's session on time and was alert, oriented x5, with no evidence or self-report of SI/HI or A/V H.  Patient reported ongoing compliance with medication. Clinician inquired about patient's current emotional ratings, as well as any significant changes in thoughts, feelings or behavior since previous session.  Patient reported scores of 7/10 for depression, 7/10 for anxiety, 7/10 for anger/irritability. Cln explored with pt reasons for increases in symptoms. Patient was experiencing increased symptoms during th past week.Clinician utilized MI OARS to reflect and summarize thoughts and feelings. Patient joined late because he had a meeting at work. Cln will continue to monitor his moods. At next session cln will complete more screenings.              PLAN: Childhood relationship with mother      Assessment and Plan: Counselor will continue to meet with patient to address treatment plan goals. Patient will continue to follow recommendations of providers and implement skills learned in session.  Follow Up Instructions: I discussed the assessment and treatment plan with the patient. The patient was provided an opportunity to ask questions and all were answered. The patient agreed with the plan and demonstrated an understanding of the instructions.   The  patient was advised to call back or seek an in-person evaluation if the symptoms worsen or if the condition fails to improve as anticipated.  I provided 30 minutes of non-face-to-face time during this encounter.   Mark [name] S, LCAS  .

## 2020-03-28 ENCOUNTER — Encounter (HOSPITAL_COMMUNITY): Payer: Self-pay | Admitting: Licensed Clinical Social Worker

## 2020-03-28 ENCOUNTER — Ambulatory Visit (INDEPENDENT_AMBULATORY_CARE_PROVIDER_SITE_OTHER): Payer: No Typology Code available for payment source | Admitting: Licensed Clinical Social Worker

## 2020-03-28 ENCOUNTER — Other Ambulatory Visit: Payer: Self-pay

## 2020-03-28 DIAGNOSIS — F4310 Post-traumatic stress disorder, unspecified: Secondary | ICD-10-CM

## 2020-03-28 DIAGNOSIS — F419 Anxiety disorder, unspecified: Secondary | ICD-10-CM | POA: Diagnosis not present

## 2020-03-28 DIAGNOSIS — F331 Major depressive disorder, recurrent, moderate: Secondary | ICD-10-CM

## 2020-03-28 NOTE — Progress Notes (Signed)
Virtual Visit via Video Note  I connected with [name] on 03/28/20 at 5:00 pm EST by a video enabled telemedicine application and verified that I am speaking with the correct person using two identifiers.   I discussed the limitations of evaluation and management by telemedicine and the availability of in person appointments. The patient expressed understanding and agreed to proceed.  LOCATION: Patient: Home Provider: home Office  History of Present Illness: [facility], depression, [facility].    Observation/Objective:     Patient participated in a discussion  on overcoming life's challenges  with stress making it more difficult  to overcome obstacles. Patient  was encouraged to identify life  challenges and explore strategies  for overcoming obstacles.     Assessment and Plan: Counselor will continue to meet with patient address treatment plan goals. Patient recommendations of providers and implement skill.   Follow Up Instructions Patient engaged in discussion on identified positives to assess how they have impacted mood. Clinician allowed space for group to further process emotions and clinician provided supportive statements throughout session for each patient. It was suggested to patient to continue to identify positives that have impacted mood.   Follow-up instructions: I discussed the assessment and treatment plan with the patient. The patient was provided an opportunity to ask questions and all were answered. The patient agreed with the plan and demonstrated an understanding of the instructions.   The patient was advised to call back or seek an in-person evaluation if the symptoms worsen or if the condition fails to improve as anticipated.  I provided 120 minutes of non-face-to-face time during this encounter.   Debie Ashline S, LCAS

## 2020-03-31 ENCOUNTER — Other Ambulatory Visit: Payer: Self-pay

## 2020-03-31 ENCOUNTER — Ambulatory Visit (INDEPENDENT_AMBULATORY_CARE_PROVIDER_SITE_OTHER): Payer: No Typology Code available for payment source | Admitting: Licensed Clinical Social Worker

## 2020-03-31 DIAGNOSIS — F4310 Post-traumatic stress disorder, unspecified: Secondary | ICD-10-CM | POA: Diagnosis not present

## 2020-03-31 DIAGNOSIS — F419 Anxiety disorder, unspecified: Secondary | ICD-10-CM

## 2020-03-31 DIAGNOSIS — F331 Major depressive disorder, recurrent, moderate: Secondary | ICD-10-CM

## 2020-03-31 NOTE — Progress Notes (Signed)
Virtual Visit via Video Note  I connected with Mark [name] on 03/31/20 at 4:30pm EST by video-enabled telemedicine with the correct person using two identifiers.   I discussed the limitations of evaluation and management by telemedicine and the availability of in person appointments. The patient expressed understanding and agreed to proceed.  LOCATION: Patient: Home Provider: home office    History of Present Illness: Pt was referred to [facility] OP therapy for PTSD, depression and anxiety by the [facility].   Treatment Goal: Verbalize positive and hopeful feelings of the future.      Observations/Objective: Patient presented for today's session on time and was alert, oriented x5, with no evidence or self-report of SI/HI or A/V H.  Patient reported ongoing compliance with medication. Clinician inquired about patient's current emotional ratings, as well as any significant changes in thoughts, feelings or behavior since previous session.  Patient reported scores of 7/10 for depression, 7/10 for anxiety, 7/10 for anger/irritability. Cln explored with pt reasons for increases in symptoms: family relationships, finances, job responsibilities.  Cln explored with patient examining his thoughts, beliefs, and basic assumptions which can assist patient in learning to  make better informed choices about issues that impact him. Pt practiced skills in session. Cln will continue to monitor.                 PLAN: Childhood relationship with mother      Assessment and Plan: Counselor will continue to meet with patient to address treatment plan goals. Patient will continue to follow recommendations of providers and implement skills learned in session.  Follow Up Instructions: I discussed the assessment and treatment plan with the patient. The patient was provided an opportunity to ask questions and all were answered. The patient agreed with the plan and demonstrated an understanding of the  instructions.   The patient was advised to call back or seek an in-person evaluation if the symptoms worsen or if the condition fails to improve as anticipated.  I provided 60 minutes of non-face-to-face time during this encounter.   Corri Delapaz S, LCAS  .

## 2020-04-04 ENCOUNTER — Encounter (HOSPITAL_COMMUNITY): Payer: Self-pay | Admitting: Licensed Clinical Social Worker

## 2020-04-04 ENCOUNTER — Other Ambulatory Visit: Payer: Self-pay

## 2020-04-04 ENCOUNTER — Ambulatory Visit (INDEPENDENT_AMBULATORY_CARE_PROVIDER_SITE_OTHER): Payer: No Typology Code available for payment source | Admitting: Licensed Clinical Social Worker

## 2020-04-04 DIAGNOSIS — F4310 Post-traumatic stress disorder, unspecified: Secondary | ICD-10-CM | POA: Diagnosis not present

## 2020-04-04 DIAGNOSIS — F331 Major depressive disorder, recurrent, moderate: Secondary | ICD-10-CM | POA: Diagnosis not present

## 2020-04-04 DIAGNOSIS — F419 Anxiety disorder, unspecified: Secondary | ICD-10-CM | POA: Diagnosis not present

## 2020-04-04 NOTE — Progress Notes (Signed)
Virtual Visit via Video Note  I connected with [name] on 04/04/20 at 5:00 pm EST by a video enabled telemedicine application and verified that I am speaking with the correct person using two identifiers.   I discussed the limitations of evaluation and management by telemedicine and the availability of in person appointments. The patient expressed understanding and agreed to proceed.  LOCATION: Patient: Home Provider: home Office  History of Present Illness: [facility], depression, [facility].    Observation/Objective:      Patient participated in a discussion on relationships (spousal, parent/child, intimate, partnership) where the group discussed effective communication, I-statements and fair fighting. Pt was encouraged to work on his current relationship using effective communication including I statements and fair fighting rules.   Assessment and Plan: Counselor will continue to meet with patient address treatment plan goals. Patient recommendations of providers and implement skill.   Follow Up Instructions Patient engaged in discussion on identified positives to assess how they have impacted mood. Clinician allowed space for group to further process emotions and clinician provided supportive statements throughout session for each patient. It was suggested to patient to continue to identify positives that have impacted mood.   Follow-up instructions: I discussed the assessment and treatment plan with the patient. The patient was provided an opportunity to ask questions and all were answered. The patient agreed with the plan and demonstrated an understanding of the instructions.   The patient was advised to call back or seek an in-person evaluation if the symptoms worsen or if the condition fails to improve as anticipated.  I provided 120 minutes of non-face-to-face time during this encounter.   Sheron Robin S, LCAS

## 2020-04-07 ENCOUNTER — Other Ambulatory Visit: Payer: Self-pay

## 2020-04-07 ENCOUNTER — Ambulatory Visit (INDEPENDENT_AMBULATORY_CARE_PROVIDER_SITE_OTHER): Payer: No Typology Code available for payment source | Admitting: Licensed Clinical Social Worker

## 2020-04-07 DIAGNOSIS — F4310 Post-traumatic stress disorder, unspecified: Secondary | ICD-10-CM

## 2020-04-07 DIAGNOSIS — F419 Anxiety disorder, unspecified: Secondary | ICD-10-CM

## 2020-04-07 DIAGNOSIS — F331 Major depressive disorder, recurrent, moderate: Secondary | ICD-10-CM | POA: Diagnosis not present

## 2020-04-11 ENCOUNTER — Encounter (HOSPITAL_COMMUNITY): Payer: Self-pay | Admitting: Licensed Clinical Social Worker

## 2020-04-11 ENCOUNTER — Other Ambulatory Visit: Payer: Self-pay

## 2020-04-11 ENCOUNTER — Ambulatory Visit (INDEPENDENT_AMBULATORY_CARE_PROVIDER_SITE_OTHER): Payer: No Typology Code available for payment source | Admitting: Licensed Clinical Social Worker

## 2020-04-11 DIAGNOSIS — F4310 Post-traumatic stress disorder, unspecified: Secondary | ICD-10-CM

## 2020-04-11 DIAGNOSIS — F331 Major depressive disorder, recurrent, moderate: Secondary | ICD-10-CM

## 2020-04-11 DIAGNOSIS — F419 Anxiety disorder, unspecified: Secondary | ICD-10-CM | POA: Diagnosis not present

## 2020-04-11 NOTE — Progress Notes (Signed)
Virtual Visit via Video Note  I connected with Mark [name] on 04/11/20 at 5:00 pm EST by a video enabled telemedicine application and verified that I am speaking with the correct person using two identifiers.   I discussed the limitations of evaluation and management by telemedicine and the availability of in person appointments. The patient expressed understanding and agreed to proceed.  LOCATION: Patient: Home Provider: home Office  History of Present Illness: [facility], depression, anxiety.    Observation/Objective:      Patient participated in a discussion on believing in yourself, having faith in your own capabilities. When you believe in yourself, you can overcome self-doubt and have the confidence to take action and get things done.  Patient was encouraged to continue believing in himself.  Assessment and Plan: Counselor will continue to meet with patient address treatment plan goals. Patient recommendations of providers and implement skill.   Follow Up Instructions Patient engaged in discussion on identified positives to assess how they have impacted mood. Clinician allowed space for group to further process emotions and clinician provided supportive statements throughout session for each patient. It was suggested to patient to continue to identify positives that have impacted mood.   Follow-up instructions: I discussed the assessment and treatment plan with the patient. The patient was provided an opportunity to ask questions and all were answered. The patient agreed with the plan and demonstrated an understanding of the instructions.   The patient was advised to call back or seek an in-person evaluation if the symptoms worsen or if the condition fails to improve as anticipated.  I provided 120 minutes of non-face-to-face time during this encounter.   Mark [name] S, LCAS

## 2020-04-13 ENCOUNTER — Encounter (HOSPITAL_COMMUNITY): Payer: Self-pay | Admitting: Licensed Clinical Social Worker

## 2020-04-13 NOTE — Progress Notes (Signed)
Virtual Visit via Video Note  I connected with Mark [name] on 04/07/20 at 4:30pm EST by video-enabled telemedicine with the correct person using two identifiers.   I discussed the limitations of evaluation and management by telemedicine and the availability of in person appointments. The patient expressed understanding and agreed to proceed.  LOCATION: Patient: Home Provider: home office    History of Present Illness: Pt was referred to [facility] OP therapy for PTSD, depression and anxiety by the [facility].   Treatment Goal: Verbalize positive and hopeful feelings of the future.      Observations/Objective: Patient presented for today's session on time and was alert, oriented x5, with no evidence or self-report of SI/HI or A/V H.  Patient reported ongoing compliance with medication. Clinician inquired about patient's current emotional ratings, as well as any significant changes in thoughts, feelings or behavior since previous session.  Patient reported scores of 7/10 for depression, 7/10 for anxiety, 7/10 for anger/irritability. Cln explored with pt reasons for increases in symptoms: family relationships, finances, health issues.  Pt reports he is beginning to experience sores again, as before. He is watching them to make sure they don't get infected as before. Pt reports he is concerned about the sores, last time, "I ended up in the hospital."  Cln suggested patient go to the [place] for followup of his sores. Clinician utilized CBT to process his thoughts and feelings. Clinician noted the challenge in being productive, working, and feeling positive when physical health is poor. Clinician provided supportive feedback.                 PLAN: Childhood relationship with mother      Assessment and Plan: Counselor will continue to meet with patient to address treatment plan goals. Patient will continue to follow recommendations of providers and implement skills learned in  session.  Follow Up Instructions: I discussed the assessment and treatment plan with the patient. The patient was provided an opportunity to ask questions and all were answered. The patient agreed with the plan and demonstrated an understanding of the instructions.   The patient was advised to call back or seek an in-person evaluation if the symptoms worsen or if the condition fails to improve as anticipated.  I provided 45 minutes of non-face-to-face time during this encounter.   Randee Upchurch S, LCAS  .

## 2020-04-14 ENCOUNTER — Emergency Department (HOSPITAL_COMMUNITY)
Admission: EM | Admit: 2020-04-14 | Discharge: 2020-04-14 | Disposition: A | Discharge status: Home / Self Care | Payer: No Typology Code available for payment source | Attending: Emergency Medicine | Admitting: Emergency Medicine

## 2020-04-14 ENCOUNTER — Ambulatory Visit (INDEPENDENT_AMBULATORY_CARE_PROVIDER_SITE_OTHER): Payer: No Typology Code available for payment source | Admitting: Licensed Clinical Social Worker

## 2020-04-14 ENCOUNTER — Other Ambulatory Visit: Payer: Self-pay

## 2020-04-14 ENCOUNTER — Encounter (HOSPITAL_COMMUNITY): Payer: Self-pay | Admitting: Emergency Medicine

## 2020-04-14 DIAGNOSIS — F4310 Post-traumatic stress disorder, unspecified: Secondary | ICD-10-CM

## 2020-04-14 DIAGNOSIS — I10 Essential (primary) hypertension: Secondary | ICD-10-CM | POA: Insufficient documentation

## 2020-04-14 DIAGNOSIS — F331 Major depressive disorder, recurrent, moderate: Secondary | ICD-10-CM | POA: Diagnosis not present

## 2020-04-14 DIAGNOSIS — Z79899 Other long term (current) drug therapy: Secondary | ICD-10-CM | POA: Diagnosis not present

## 2020-04-14 DIAGNOSIS — F419 Anxiety disorder, unspecified: Secondary | ICD-10-CM | POA: Diagnosis not present

## 2020-04-14 DIAGNOSIS — L02416 Cutaneous abscess of left lower limb: Secondary | ICD-10-CM | POA: Insufficient documentation

## 2020-04-14 DIAGNOSIS — Z794 Long term (current) use of insulin: Secondary | ICD-10-CM | POA: Insufficient documentation

## 2020-04-14 DIAGNOSIS — E119 Type 2 diabetes mellitus without complications: Secondary | ICD-10-CM | POA: Insufficient documentation

## 2020-04-14 DIAGNOSIS — L0231 Cutaneous abscess of buttock: Secondary | ICD-10-CM | POA: Insufficient documentation

## 2020-04-14 DIAGNOSIS — R739 Hyperglycemia, unspecified: Secondary | ICD-10-CM

## 2020-04-14 DIAGNOSIS — L0291 Cutaneous abscess, unspecified: Secondary | ICD-10-CM

## 2020-04-14 LAB — CBC WITH DIFFERENTIAL/PLATELET
Abs Immature Granulocytes: 0.1 10*3/uL — ABNORMAL HIGH (ref 0.00–0.07)
Basophils Absolute: 0.1 10*3/uL (ref 0.0–0.1)
Basophils Relative: 0 %
Eosinophils Absolute: 0.1 10*3/uL (ref 0.0–0.5)
Eosinophils Relative: 0 %
HCT: 36 % — ABNORMAL LOW (ref 39.0–52.0)
Hemoglobin: 12.1 g/dL — ABNORMAL LOW (ref 13.0–17.0)
Immature Granulocytes: 1 %
Lymphocytes Relative: 13 %
Lymphs Abs: 1.9 10*3/uL (ref 0.7–4.0)
MCH: 27.9 pg (ref 26.0–34.0)
MCHC: 33.6 g/dL (ref 30.0–36.0)
MCV: 82.9 fL (ref 80.0–100.0)
Monocytes Absolute: 1.3 10*3/uL — ABNORMAL HIGH (ref 0.1–1.0)
Monocytes Relative: 8 %
Neutro Abs: 12 10*3/uL — ABNORMAL HIGH (ref 1.7–7.7)
Neutrophils Relative %: 78 %
Platelets: 270 10*3/uL (ref 150–400)
RBC: 4.34 MIL/uL (ref 4.22–5.81)
RDW: 15.2 % (ref 11.5–15.5)
WBC: 15.4 10*3/uL — ABNORMAL HIGH (ref 4.0–10.5)
nRBC: 0 % (ref 0.0–0.2)

## 2020-04-14 LAB — COMPREHENSIVE METABOLIC PANEL
ALT: 14 U/L (ref 0–44)
AST: 13 U/L — ABNORMAL LOW (ref 15–41)
Albumin: 3.2 g/dL — ABNORMAL LOW (ref 3.5–5.0)
Alkaline Phosphatase: 122 U/L (ref 38–126)
Anion gap: 13 (ref 5–15)
BUN: 13 mg/dL (ref 6–20)
CO2: 24 mmol/L (ref 22–32)
Calcium: 9 mg/dL (ref 8.9–10.3)
Chloride: 97 mmol/L — ABNORMAL LOW (ref 98–111)
Creatinine, Ser: 1.28 mg/dL — ABNORMAL HIGH (ref 0.61–1.24)
GFR, Estimated: >60 mL/min (ref >60)
Glucose, Bld: 382 mg/dL — ABNORMAL HIGH (ref 70–99)
Potassium: 4.3 mmol/L (ref 3.5–5.1)
Sodium: 134 mmol/L — ABNORMAL LOW (ref 135–145)
Total Bilirubin: 0.3 mg/dL (ref 0.3–1.2)
Total Protein: 7.7 g/dL (ref 6.5–8.1)

## 2020-04-14 MED ORDER — LIDOCAINE-EPINEPHRINE 1 %-1:100000 IJ SOLN
20.0000 mL | Freq: Once | INTRAMUSCULAR | Status: AC
  Administered 2020-04-14: 20 mL
  Filled 2020-04-14: qty 1

## 2020-04-14 MED ORDER — OXYCODONE-ACETAMINOPHEN 5-325 MG PO TABS
2.0000 | ORAL_TABLET | Freq: Once | ORAL | Status: AC
  Administered 2020-04-14: 2 via ORAL
  Filled 2020-04-14: qty 2

## 2020-04-14 MED ORDER — DOXYCYCLINE HYCLATE 100 MG PO TABS
100.0000 mg | ORAL_TABLET | Freq: Once | ORAL | Status: AC
  Administered 2020-04-14: 100 mg via ORAL
  Filled 2020-04-14: qty 1

## 2020-04-14 MED ORDER — INSULIN ASPART 100 UNIT/ML ~~LOC~~ SOLN
14.0000 [IU] | Freq: Once | SUBCUTANEOUS | Status: AC
  Administered 2020-04-14: 14 [IU] via SUBCUTANEOUS

## 2020-04-14 MED ORDER — DOXYCYCLINE HYCLATE 100 MG PO CAPS: 100.0000 mg | ORAL_CAPSULE | Freq: Two times a day (BID) | ORAL | 0 refills | Status: DC

## 2020-04-14 MED ORDER — HYDROMORPHONE HCL 1 MG/ML IJ SOLN
2.0000 mg | Freq: Once | INTRAMUSCULAR | Status: AC
  Administered 2020-04-14: 2 mg via INTRAMUSCULAR
  Filled 2020-04-14: qty 2

## 2020-04-14 NOTE — ED Triage Notes (Signed)
Patient complains of an abscess on left buttock and left thigh that first appeared earlier in the year, were drained but have returned. Patient alert, oriented, and in no apparent distress.

## 2020-04-14 NOTE — Discharge Instructions (Addendum)
It was our pleasure to provide your ER care today - we hope that you feel better.  Keep areas very clean.   Follow up with primary care doctor, or urgent care, in two days time for wound recheck and packing removal.   Given recurrent abscesses, follow up with general surgery in the next 1-2 weeks - call office tomorrow AM to arrange appointment time.   Take doxycycline (antibiotic) as prescribed.  Take acetaminophen as need.   Your blood sugar is high today - drink plenty of water/stay well hydrated, continue diabetes meds, check and record sugars 4x/day, follow diabetes meal plan, and follow up with primary care doctor in the coming week - call office to arrange appointment.   Return to ER if worse, new symptoms, fevers, spreading redness, worsening or severe pain, or other concern.   You were given pain medication in the ER - no driving for the next 8 hours.

## 2020-04-14 NOTE — ED Provider Notes (Addendum)
[facility] EMERGENCY DEPARTMENT Provider Note   CSN: [id number] Arrival date & time: 04/14/20  1555     History Chief Complaint  Patient presents with  . Abscess    Mark [name] is a 60 y.o. male.  Patient with hx iddm, presents c/o two draining abscesses, one left buttock and other left posterior thigh for past several days. States hx recurrent abscesses in same area. Pain localized to area of abscess, constant, dull, moderate, worse w palpation. No fever or chills. No nausea/vomiting. Normal appetite. Hx iddm, states sugars run chronically elevated. No polyuria or polydipsia, no nv. States compliant w home meds. Indicates other than abscesses does not feel acutely ill or sick.   The history is provided by the patient.  Abscess Associated symptoms: no fever, no nausea and no vomiting        Past Medical History:  Diagnosis Date  . Above knee amputation status, right   . Anxiety   . Depression   . Diabetes mellitus, type II (HCC)   . Hyperlipemia   . Hypertension   . PTSD (post-traumatic stress disorder)   . Shoulder pain     Patient Active Problem List   Diagnosis Date Noted  . Abscess   . Pressure injury of skin 12/08/2019  . Diabetic ketoacidosis associated with type 2 diabetes mellitus (HCC) 12/04/2019  . AKI (acute kidney injury) (HCC) 12/04/2019  . Depression with anxiety 12/04/2019  . Hypertension associated with diabetes (HCC) 12/04/2019  . Hyperlipidemia associated with type 2 diabetes mellitus (HCC) 12/04/2019  . Cellulitis of left lower extremity 12/04/2019  . Cellulitis of lower back 12/04/2019  . PTSD (post-traumatic stress disorder) 04/15/2017  . Major depressive disorder, recurrent episode, moderate (HCC) 04/30/2016  . Generalized anxiety disorder 04/30/2016    Past Surgical History:  Procedure Laterality Date  . HERNIA REPAIR    . INCISION AND DRAINAGE Left 12/11/2019   Procedure: INCISION AND DRAINAGE POSTERIOR LEFT THIGH  ABSCESS;  Surgeon: Axel Filler, MD;  Location: [facility] OR;  Service: General;  Laterality: Left;  . INCISION AND DRAINAGE ABSCESS Left 12/11/2019   Procedure: INCISION AND DRAINAGE BACK ABSCESS,;  Surgeon: Axel Filler, MD;  Location: [facility] OR;  Service: General;  Laterality: Left;  . LEG AMPUTATION Right   . parenatal cyst         Family History  Problem Relation Age of Onset  . Aneurysm Mother   . Leukemia Father   . Dementia Maternal Aunt   . Seizures Paternal Uncle     Social History   Tobacco Use  . Smoking status: Former Smoker    Quit date: 08/22/2019    Years since quitting: 0.6  . Smokeless tobacco: Never Used  Vaping Use  . Vaping Use: Never used  Substance Use Topics  . Alcohol use: Yes    Alcohol/week: 4.0 standard drinks    Types: 2 Cans of beer, 2 Shots of liquor per week    Comment: Reports light drinking weekly.  . Drug use: Yes    Frequency: 14.0 times per week    Types: Marijuana    Comment: Uses 1-2 times a day per report for his anxiety    Home Medications Prior to Admission medications   Medication Sig Start Date End Date Taking? Authorizing Provider  atorvastatin (LIPITOR) 80 MG tablet Take 80 mg by mouth daily.      buPROPion (WELLBUTRIN XL) 300 MG 24 hr tablet Take 1 tablet (300 mg total) by mouth daily. 08/19/19  Arfeen, Phillips GroutSyed T, MD  busPIRone (BUSPAR) 15 MG tablet Take 1 tablet (15 mg total) by mouth 3 (three) times daily. 08/19/19   Arfeen, Phillips GroutSyed T, MD  carvedilol (COREG) 12.5 MG tablet Take 12.5 mg by mouth 2 (two) times daily with a meal.    [provider]  escitalopram (LEXAPRO) 20 MG tablet Take 20 mg by mouth daily.    [provider]  hydrOXYzine (VISTARIL) 25 MG capsule Take 25-50 mg by mouth 2 (two) times daily as needed for anxiety.     [provider]  insulin aspart (NOVOLOG) 100 UNIT/ML injection Inject 15 Units into the skin 3 (three) times daily before meals.    [provider]  insulin glargine  (LANTUS) 100 UNIT/ML injection Inject 0.4 mLs (40 Units total) into the skin daily. 12/23/19   Joseph ArtVann, Jessica U, DO  lisinopril (ZESTRIL) 20 MG tablet Take 20 mg by mouth daily.     [provider]  pioglitazone (ACTOS) 15 MG tablet Take 15 mg by mouth daily.    [provider]  prazosin (MINIPRESS) 2 MG capsule Take 1 capsule (2 mg total) by mouth at bedtime. 08/19/19   Arfeen, Phillips GroutSyed T, MD  sildenafil (VIAGRA) 100 MG tablet Take 100 mg by mouth daily as needed for erectile dysfunction.    [provider]  traZODone (DESYREL) 50 MG tablet Take 1 tablet (50 mg total) by mouth at bedtime as needed. Patient taking differently: Take 50 mg by mouth at bedtime as needed for sleep.  08/19/19   Arfeen, Phillips GroutSyed T, MD    Allergies    Ozempic (0.25 or 0.5 mg-dose) [semaglutide(0.25 or 0.5mg -dos)], Semaglutide, and Lyrica [pregabalin]  Review of Systems   Review of Systems  Constitutional: Negative for chills and fever.  HENT: Negative for sore throat.   Eyes: Negative for redness.  Respiratory: Negative for shortness of breath.   Cardiovascular: Negative for chest pain.  Gastrointestinal: Negative for abdominal pain, nausea and vomiting.  Genitourinary: Negative for dysuria and flank pain.  Musculoskeletal: Negative for back pain and neck pain.  Skin:       abscesses  Neurological: Negative for light-headedness.  Hematological: Does not bruise/bleed easily.  Psychiatric/Behavioral: Negative for confusion.    Physical Exam Updated Vital Signs BP (!) 148/71 (BP Location: Right Arm)   Pulse (!) 110   Temp 97.7 F (36.5 C)   Resp 20   SpO2 98%   Physical Exam Vitals and nursing note reviewed.  Constitutional:      Appearance: Normal appearance. He is well-developed.  HENT:     Head: Atraumatic.     Nose: Nose normal.     Mouth/Throat:     Mouth: Mucous membranes are moist.     Pharynx: Oropharynx is clear.  Eyes:     General: No scleral icterus.     Conjunctiva/sclera: Conjunctivae normal.  Neck:     Trachea: No tracheal deviation.  Cardiovascular:     Rate and Rhythm: Normal rate and regular rhythm.     Pulses: Normal pulses.     Heart sounds: Normal heart sounds. No murmur heard. No friction rub. No gallop.   Pulmonary:     Effort: Pulmonary effort is normal. No accessory muscle usage or respiratory distress.     Breath sounds: Normal breath sounds.  Abdominal:     General: Bowel sounds are normal. There is no distension.     Palpations: Abdomen is soft.     Tenderness: There is no  abdominal tenderness.     Comments: obese  Genitourinary:    Comments: No cva tenderness. Musculoskeletal:     Cervical back: Normal range of motion and neck supple. No rigidity.     Comments: Abscess to left buttock and left posterior thigh, buttock abscesses approximately 12 cm diameter, mild surrounding erythema. Thigh abscess ~ 5 cm diameter. No crepitus, no devitalized or necrotic tissue.   Skin:    General: Skin is warm and dry.     Findings: No rash.  Neurological:     Mental Status: He is alert.     Comments: Alert, speech clear.   Psychiatric:        Mood and Affect: Mood normal.     ED Results / Procedures / Treatments   Labs (all labs ordered are listed, but only abnormal results are displayed) Results for orders placed or performed during the hospital encounter of 04/14/20  Comprehensive metabolic panel  Result Value Ref Range   Sodium 134 (L) 135 - 145 mmol/L   Potassium 4.3 3.5 - 5.1 mmol/L   Chloride 97 (L) 98 - 111 mmol/L   CO2 24 22 - 32 mmol/L   Glucose, Bld 382 (H) 70 - 99 mg/dL   BUN 13 6 - 20 mg/dL   Creatinine, Ser 7.41 (H) 0.61 - 1.24 mg/dL   Calcium 9.0 8.9 - 28.7 mg/dL   Total Protein 7.7 6.5 - 8.1 g/dL   Albumin 3.2 (L) 3.5 - 5.0 g/dL   AST 13 (L) 15 - 41 U/L   ALT 14 0 - 44 U/L   Alkaline Phosphatase 122 38 - 126 U/L   Total Bilirubin 0.3 0.3 - 1.2 mg/dL   GFR, Estimated >86 >76 mL/min   Anion gap 13 5  - 15  CBC with Differential  Result Value Ref Range   WBC 15.4 (H) 4.0 - 10.5 K/uL   RBC 4.34 4.22 - 5.81 MIL/uL   Hemoglobin 12.1 (L) 13.0 - 17.0 g/dL   HCT 72.0 (L) 94.7 - 09.6 %   MCV 82.9 80.0 - 100.0 fL   MCH 27.9 26.0 - 34.0 pg   MCHC 33.6 30.0 - 36.0 g/dL   RDW 28.3 66.2 - 94.7 %   Platelets 270 150 - 400 K/uL   nRBC 0.0 0.0 - 0.2 %   Neutrophils Relative % 78 %   Neutro Abs 12.0 (H) 1.7 - 7.7 K/uL   Lymphocytes Relative 13 %   Lymphs Abs 1.9 0.7 - 4.0 K/uL   Monocytes Relative 8 %   Monocytes Absolute 1.3 (H) 0.1 - 1.0 K/uL   Eosinophils Relative 0 %   Eosinophils Absolute 0.1 0.0 - 0.5 K/uL   Basophils Relative 0 %   Basophils Absolute 0.1 0.0 - 0.1 K/uL   Immature Granulocytes 1 %   Abs Immature Granulocytes 0.10 (H) 0.00 - 0.07 K/uL    EKG None  Radiology No results found.  Procedures .Marland KitchenIncision and Drainage  Date/Time: 04/14/2020 7:13 PM Performed by: Cathren Laine, MD Authorized by: Cathren Laine, MD   Consent:    Consent given by:  Patient Location:    Type:  Abscess   Location:  Anogenital   Anogenital location: left buttock. Pre-procedure details:    Skin preparation:  Povidone-iodine Anesthesia:    Anesthesia method:  Local infiltration   Local anesthetic:  Lidocaine 2% WITH epi Procedure type:    Complexity:  Complex Procedure details:    Incision types:  Elliptical   Incision depth:  Subcutaneous   Wound management:  Probed and deloculated and irrigated with saline   Drainage:  Purulent   Drainage amount:  Moderate   Packing materials:  1/2 in gauze Post-procedure details:    Procedure completion:  Tolerated well, no immediate complications .Marland KitchenIncision and Drainage  Date/Time: 04/14/2020 7:14 PM Performed by: Cathren Laine, MD Authorized by: Cathren Laine, MD   Consent:    Consent given by:  Patient Location:    Location:  Lower extremity   Lower extremity location: right posterior thigh. Pre-procedure details:    Skin  preparation:  Povidone-iodine Anesthesia:    Anesthesia method:  Local infiltration   Local anesthetic:  Lidocaine 2% WITH epi Procedure details:    Incision types:  Elliptical   Incision depth:  Subcutaneous   Wound management:  Probed and deloculated and irrigated with saline   Drainage:  Purulent   Drainage amount:  Moderate   Packing materials:  1/2 in gauze Post-procedure details:    Procedure completion:  Tolerated well, no immediate complications     Medications Ordered in ED Medications  insulin aspart (novoLOG) injection 14 Units (has no administration in time range)  oxyCODONE-acetaminophen (PERCOCET/ROXICET) 5-325 MG per tablet 2 tablet (has no administration in time range)  lidocaine-EPINEPHrine (XYLOCAINE W/EPI) 1 %-1:100000 (with pres) injection 20 mL (has no administration in time range)    ED Course  I have reviewed the triage vital signs and the nursing notes.  Pertinent labs & imaging results that were available during my care of the patient were reviewed by me and considered in my medical decision making (see chart for details).    MDM Rules/Calculators/A&P                         Labs sent.   Reviewed nursing notes and prior charts for additional history.   Percocet po.   Labs reviewed/interpreted by me - wbc elev. Glucose elevated. hco3 normal. novolog sq.   I and D abscesses. Probed/deloculated, applied pressure as much as patient able to tolerate. Sterile dressings applied.   Shortly after starting I and D pt request shot of pain med, states dilaudid has worked well in past. Dilaudid 2 mg im, paused procedure. Resume when patient more comfortable.   Doxycycline po.   Recheck, pain resolved, pt reports feeling much improved.   Pt appears stable for d/c.       Final Clinical Impression(s) / ED Diagnoses Final diagnoses:  None    Rx / DC Orders ED Discharge Orders    None         Cathren Laine, MD 04/14/20 [phone]

## 2020-04-16 ENCOUNTER — Encounter (HOSPITAL_COMMUNITY): Payer: Self-pay | Admitting: Emergency Medicine

## 2020-04-16 ENCOUNTER — Emergency Department (HOSPITAL_COMMUNITY)
Admission: EM | Admit: 2020-04-16 | Discharge: 2020-04-16 | Disposition: A | Discharge status: Home / Self Care | Payer: No Typology Code available for payment source | Attending: Emergency Medicine | Admitting: Emergency Medicine

## 2020-04-16 DIAGNOSIS — E1110 Type 2 diabetes mellitus with ketoacidosis without coma: Secondary | ICD-10-CM | POA: Insufficient documentation

## 2020-04-16 DIAGNOSIS — I10 Essential (primary) hypertension: Secondary | ICD-10-CM | POA: Insufficient documentation

## 2020-04-16 DIAGNOSIS — Z79899 Other long term (current) drug therapy: Secondary | ICD-10-CM | POA: Insufficient documentation

## 2020-04-16 DIAGNOSIS — Z09 Encounter for follow-up examination after completed treatment for conditions other than malignant neoplasm: Secondary | ICD-10-CM

## 2020-04-16 DIAGNOSIS — Z4800 Encounter for change or removal of nonsurgical wound dressing: Secondary | ICD-10-CM | POA: Diagnosis present

## 2020-04-16 DIAGNOSIS — Z794 Long term (current) use of insulin: Secondary | ICD-10-CM | POA: Diagnosis not present

## 2020-04-16 MED ORDER — OXYCODONE-ACETAMINOPHEN 5-325 MG PO TABS
1.0000 | ORAL_TABLET | ORAL | 0 refills | Status: DC | PRN
Start: 2020-04-16 — End: 2020-10-18

## 2020-04-16 MED ORDER — OXYCODONE-ACETAMINOPHEN 5-325 MG PO TABS
1.0000 | ORAL_TABLET | Freq: Once | ORAL | Status: AC
  Administered 2020-04-16: 1 via ORAL
  Filled 2020-04-16: qty 1

## 2020-04-16 NOTE — Discharge Instructions (Addendum)
Use warm compresses or heat on the wounds, 3-4 times a day for 30 to 40 minutes.  Clean the wounds daily with soap and water.  Can remove the packing if it is not all out within 3 to 5 days.  Just grab it and pull straight out quickly.  Follow-up with the surgeon for further evaluation and treatment next week.

## 2020-04-16 NOTE — ED Notes (Signed)
E-signature pad unavailable at time of pt discharge. This RN discussed discharge materials with pt and answered all pt questions. Pt stated understanding of discharge material. ? ?

## 2020-04-16 NOTE — ED Triage Notes (Signed)
Pt states he was seen in ED yesterday and had I&D of abscess on L buttocks and thigh.  States he needs bandages changed and is having 10/10 pain.

## 2020-04-16 NOTE — ED Provider Notes (Signed)
[name] EMERGENCY DEPARTMENT Provider Note   CSN: [id number] Arrival date & time: 04/16/20  1705     History Chief Complaint  Patient presents with  . Wound Check    Mark [name] is a 60 y.o. male.  HPI Patient is here for evaluation of wounds, which were treated with I&D, 2 days ago.  He complains of pain, since he has run out of his Percocet.  He takes Percocet, chronically, intermittently sometimes not at all sometimes a few a week.  He denies fever, chills, nausea or vomiting.  He has had multiple similar wounds in the past treated with I&D.  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  He has trouble getting into a bath to soak because of the disability.  There are no other known active modifying factors.    Past Medical History:  Diagnosis Date  . Above knee amputation status, right   . Anxiety   . Depression   . Diabetes mellitus, type II (HCC)   . Hyperlipemia   . Hypertension   . PTSD (post-traumatic stress disorder)   . Shoulder pain     Patient Active Problem List   Diagnosis Date Noted  . Abscess   . Pressure injury of skin 12/08/2019  . Diabetic ketoacidosis associated with type 2 diabetes mellitus (HCC) 12/04/2019  . AKI (acute kidney injury) (HCC) 12/04/2019  . Depression with anxiety 12/04/2019  . Hypertension associated with diabetes (HCC) 12/04/2019  . Hyperlipidemia associated with type 2 diabetes mellitus (HCC) 12/04/2019  . Cellulitis of left lower extremity 12/04/2019  . Cellulitis of lower back 12/04/2019  . PTSD (post-traumatic stress disorder) 04/15/2017  . Major depressive disorder, recurrent episode, moderate (HCC) 04/30/2016  . Generalized anxiety disorder 04/30/2016    Past Surgical History:  Procedure Laterality Date  . HERNIA REPAIR    . INCISION AND DRAINAGE Left 12/11/2019   Procedure: INCISION AND DRAINAGE POSTERIOR LEFT THIGH ABSCESS;  Surgeon: Axel Filler, MD;  Location: [facility] OR;  Service: General;   Laterality: Left;  . INCISION AND DRAINAGE ABSCESS Left 12/11/2019   Procedure: INCISION AND DRAINAGE BACK ABSCESS,;  Surgeon: Axel Filler, MD;  Location: [facility] OR;  Service: General;  Laterality: Left;  . LEG AMPUTATION Right   . parenatal cyst         Family History  Problem Relation Age of Onset  . Aneurysm Mother   . Leukemia Father   . Dementia Maternal Aunt   . Seizures Paternal Uncle     Social History   Tobacco Use  . Smoking status: Former Smoker    Quit date: 08/22/2019    Years since quitting: 0.6  . Smokeless tobacco: Never Used  Vaping Use  . Vaping Use: Never used  Substance Use Topics  . Alcohol use: Yes    Alcohol/week: 4.0 standard drinks    Types: 2 Cans of beer, 2 Shots of liquor per week    Comment: Reports light drinking weekly.  . Drug use: Yes    Frequency: 14.0 times per week    Types: Marijuana    Comment: Uses 1-2 times a day per report for his anxiety    Home Medications Prior to Admission medications   Medication Sig Start Date End Date Taking? Authorizing Provider  oxyCODONE-acetaminophen (PERCOCET) 5-325 MG tablet Take 1 tablet by mouth every 4 (four) hours as needed for moderate pain or severe pain. 04/16/20  Yes Mancel Bale, MD  atorvastatin (LIPITOR) 80 MG tablet Take 80  mg by mouth daily.      buPROPion (WELLBUTRIN XL) 300 MG 24 hr tablet Take 1 tablet (300 mg total) by mouth daily. 08/19/19   Arfeen, Phillips Grout, MD  busPIRone (BUSPAR) 15 MG tablet Take 1 tablet (15 mg total) by mouth 3 (three) times daily. 08/19/19   Arfeen, Phillips Grout, MD  carvedilol (COREG) 12.5 MG tablet Take 12.5 mg by mouth 2 (two) times daily with a meal.    [provider]  doxycycline (VIBRAMYCIN) 100 MG capsule Take 1 capsule (100 mg total) by mouth 2 (two) times daily. 04/14/20   Cathren Laine, MD  escitalopram (LEXAPRO) 20 MG tablet Take 20 mg by mouth daily.    [provider]  hydrOXYzine (VISTARIL) 25 MG capsule Take 25-50 mg by mouth 2 (two)  times daily as needed for anxiety.     [provider]  insulin aspart (NOVOLOG) 100 UNIT/ML injection Inject 15 Units into the skin 3 (three) times daily before meals.    [provider]  insulin glargine (LANTUS) 100 UNIT/ML injection Inject 0.4 mLs (40 Units total) into the skin daily. 12/23/19   Joseph Art, DO  lisinopril (ZESTRIL) 20 MG tablet Take 20 mg by mouth daily.     [provider]  pioglitazone (ACTOS) 15 MG tablet Take 15 mg by mouth daily.    [provider]  prazosin (MINIPRESS) 2 MG capsule Take 1 capsule (2 mg total) by mouth at bedtime. 08/19/19   Arfeen, Phillips Grout, MD  sildenafil (VIAGRA) 100 MG tablet Take 100 mg by mouth daily as needed for erectile dysfunction.    [provider]  traZODone (DESYREL) 50 MG tablet Take 1 tablet (50 mg total) by mouth at bedtime as needed. Patient taking differently: Take 50 mg by mouth at bedtime as needed for sleep.  08/19/19   Arfeen, Phillips Grout, MD    Allergies    Ozempic (0.25 or 0.5 mg-dose) [semaglutide(0.25 or 0.5mg -dos)], Semaglutide, and Lyrica [pregabalin]  Review of Systems   Review of Systems  All other systems reviewed and are negative.   Physical Exam Updated Vital Signs BP (!) 150/100 (BP Location: Right Arm)   Pulse (!) 108   Temp 97.9 F (36.6 C)   Resp 18   SpO2 100%   Physical Exam Vitals and nursing note reviewed.  Constitutional:      General: He is not in acute distress.    Appearance: He is well-developed. He is obese. He is not ill-appearing, toxic-appearing or diaphoretic.  HENT:     Head: Normocephalic and atraumatic.     Right Ear: External ear normal.     Left Ear: External ear normal.  Eyes:     Conjunctiva/sclera: Conjunctivae normal.     Pupils: Pupils are equal, round, and reactive to light.  Neck:     Trachea: Phonation normal.  Cardiovascular:     Rate and Rhythm: Normal rate.  Pulmonary:     Effort: Pulmonary effort is normal.  Abdominal:      General: There is no distension.  Musculoskeletal:        General: Normal range of motion.     Cervical back: Normal range of motion and neck supple.  Skin:    General: Skin is warm and dry.     Comments: Left posterolateral buttock and upper thigh, with 2 indurated areas with central incision sites containing packing.  Mild discharge from the upper wound on the buttock, with light pressure on the  surrounding tissue.  No significant fluctuance of either wound.  No significant findings for associated cellulitis.  Neurological:     Mental Status: He is alert and oriented to person, place, and time.     Cranial Nerves: No cranial nerve deficit.     Sensory: No sensory deficit.     Motor: No abnormal muscle tone.     Coordination: Coordination normal.  Psychiatric:        Mood and Affect: Mood normal.        Behavior: Behavior normal.        Thought Content: Thought content normal.        Judgment: Judgment normal.     ED Results / Procedures / Treatments   Labs (all labs ordered are listed, but only abnormal results are displayed) Labs Reviewed - No data to display  EKG None  Radiology No results found.  Procedures Procedures   Medications Ordered in ED Medications  oxyCODONE-acetaminophen (PERCOCET/ROXICET) 5-325 MG per tablet 1 tablet (1 tablet Oral Given 04/16/20 1855)    ED Course  I have reviewed the triage vital signs and the nursing notes.  Pertinent labs & imaging results that were available during my care of the patient were reviewed by me and considered in my medical decision making (see chart for details).    MDM Rules/Calculators/A&P                           Patient Vitals for the past 24 hrs:  BP Temp Pulse Resp SpO2  04/16/20 1711 (!) 150/100 97.9 F (36.6 C) (!) 108 18 100 %    7:06 PM Reevaluation with update and discussion. After initial assessment and treatment, an updated evaluation reveals he is more comfortable now has no further complaints.  Mancel BaleElliott Jaimen Melone   Medical Decision Making:  This patient is presenting for evaluation of wounds, as directed, which does require a range of treatment options, and is a complaint that involves a moderate risk of morbidity and mortality. The differential diagnoses include draining abscess, cellulitis, deep tissue infection. 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 He has diabetes.  I did not require additional historical information from anyone.    Critical Interventions-clinical evaluation, medication treat  After These Interventions, the Patient was reevaluated and was found stable for discharge.  Patient is wounds are healing, there drained only 2 days ago therefore we will leave packing in place as they were somewhat indurated when I saw them.  I doubt frank worsening infection or deep tissue infection at this time.  CRITICAL CARE-no Performed by: Mancel BaleElliott Nautica Hotz  Nursing Notes Reviewed/ Care Coordinated Applicable Imaging Reviewed Interpretation of Laboratory Data incorporated into ED treatment  The patient appears reasonably screened and/or stabilized for discharge and I doubt any other medical condition or other [facility] requiring further screening, evaluation, or treatment in the ED at this time prior to discharge.  Plan: Home Medications-continue usual; Home Treatments-wound care, packing removal in 3 to 5 days at home; return here if the recommended treatment, does not improve the symptoms; Recommended follow up-general surgery follow-up 1 week and as needed     Final Clinical Impression(s) / ED Diagnoses Final diagnoses:  Encounter for recheck of abscess following incision and drainage    Rx / DC Orders ED Discharge Orders  Ordered    oxyCODONE-acetaminophen (PERCOCET) 5-325 MG tablet  Every 4 hours PRN        04/16/20 1905            Mancel Bale, MD 04/16/20 1907

## 2020-04-18 ENCOUNTER — Encounter (HOSPITAL_COMMUNITY): Payer: Self-pay | Admitting: Licensed Clinical Social Worker

## 2020-04-18 ENCOUNTER — Ambulatory Visit (INDEPENDENT_AMBULATORY_CARE_PROVIDER_SITE_OTHER): Payer: No Typology Code available for payment source | Admitting: Licensed Clinical Social Worker

## 2020-04-18 ENCOUNTER — Other Ambulatory Visit: Payer: Self-pay

## 2020-04-18 DIAGNOSIS — F331 Major depressive disorder, recurrent, moderate: Secondary | ICD-10-CM | POA: Diagnosis not present

## 2020-04-18 DIAGNOSIS — F4310 Post-traumatic stress disorder, unspecified: Secondary | ICD-10-CM | POA: Diagnosis not present

## 2020-04-18 DIAGNOSIS — F419 Anxiety disorder, unspecified: Secondary | ICD-10-CM | POA: Diagnosis not present

## 2020-04-18 NOTE — Progress Notes (Signed)
Virtual Visit via Video Note  I connected with Mark [name] on 04/18/20 at 5:00 pm EST by a video enabled telemedicine application and verified that I am speaking with the correct person using two identifiers.   I discussed the limitations of evaluation and management by telemedicine and the availability of in person appointments. The patient expressed understanding and agreed to proceed.  LOCATION: Patient: Home Provider: home Office  History of Present Illness: [facility]    Observation/Objective:    `Patient participated in a discussion on negative thought patterns. Clinician assisted patient identifying his negative thought patterns. Patient was encouraged to use positive behavioral responses to stressful and challenging situations.    Assessment and Plan: Counselor will continue to meet with patient address treatment plan goals. Patient recommendations of providers and implement skill.   Follow Up Instructions Patient engaged in discussion on identified positives to assess how they have impacted mood. Clinician allowed space for group to further process emotions and clinician provided supportive statements throughout session for each patient. It was suggested to patient to continue to identify positives that have impacted mood.   Follow-up instructions: I discussed the assessment and treatment plan with the patient. The patient was provided an opportunity to ask questions and all were answered. The patient agreed with the plan and demonstrated an understanding of the instructions.   The patient was advised to call back or seek an in-person evaluation if the symptoms worsen or if the condition fails to improve as anticipated.  I provided 90 minutes of non-face-to-face time during this encounter.   Mark [name] S, LCAS

## 2020-04-20 ENCOUNTER — Encounter (HOSPITAL_COMMUNITY): Payer: Self-pay | Admitting: Licensed Clinical Social Worker

## 2020-04-20 NOTE — Progress Notes (Signed)
Virtual Visit via Video Note  I connected with [name] on 04/14/20 at 4:00pm EST by video-enabled telemedicine with the correct person using two identifiers.   I discussed the limitations of evaluation and management by telemedicine and the availability of in person appointments. The patient expressed understanding and agreed to proceed.  LOCATION: Patient: Home Provider: home office    History of Present Illness: Pt was referred to [facility] OP therapy for PTSD, depression and anxiety by the [facility].   Treatment Goal: Verbalize positive and hopeful feelings of the future.      Observations/Objective: Patient presented for today's session on time and was alert, oriented x5, with no evidence or self-report of SI/HI or A/V H.  Patient reported ongoing compliance with medication. Clinician inquired about patient's current emotional ratings, as well as any significant changes in thoughts, feelings or behavior since previous session.  Patient reported scores of 7/10 for depression, 7/10 for anxiety, 7/10 for anger/irritability. Cln explored with pt reasons for increases in symptoms: family relationships, finances, health issues.  Pt reports his sores are getting worse and will probably end up in the ER again. Cln discussed with pt his current support system to help with his medical needs.  Cln suggested patient go to the [place] for followup of his sores. Clinician utilized CBT to process his thoughts and feelings. Clinician provided supportive feedback. Cln will continue to monitor.       Plan: screenings          PLAN: Childhood relationship with mother      Assessment and Plan: Counselor will continue to meet with patient to address treatment plan goals. Patient will continue to follow recommendations of providers and implement skills learned in session.  Follow Up Instructions: I discussed the assessment and treatment plan with the patient. The patient was provided an  opportunity to ask questions and all were answered. The patient agreed with the plan and demonstrated an understanding of the instructions.   The patient was advised to call back or seek an in-person evaluation if the symptoms worsen or if the condition fails to improve as anticipated.  I provided 45 minutes of non-face-to-face time during this encounter.   Rosabella Edgin S, LCAS  .

## 2020-04-21 ENCOUNTER — Other Ambulatory Visit: Payer: Self-pay

## 2020-04-21 ENCOUNTER — Ambulatory Visit (INDEPENDENT_AMBULATORY_CARE_PROVIDER_SITE_OTHER): Payer: No Typology Code available for payment source | Admitting: Licensed Clinical Social Worker

## 2020-04-21 DIAGNOSIS — F419 Anxiety disorder, unspecified: Secondary | ICD-10-CM

## 2020-04-21 DIAGNOSIS — F331 Major depressive disorder, recurrent, moderate: Secondary | ICD-10-CM | POA: Diagnosis not present

## 2020-04-21 DIAGNOSIS — F4310 Post-traumatic stress disorder, unspecified: Secondary | ICD-10-CM

## 2020-04-25 ENCOUNTER — Other Ambulatory Visit: Payer: Self-pay

## 2020-04-25 ENCOUNTER — Encounter (HOSPITAL_COMMUNITY): Payer: Self-pay | Admitting: Licensed Clinical Social Worker

## 2020-04-25 ENCOUNTER — Ambulatory Visit (INDEPENDENT_AMBULATORY_CARE_PROVIDER_SITE_OTHER): Payer: No Typology Code available for payment source | Admitting: Licensed Clinical Social Worker

## 2020-04-25 DIAGNOSIS — F419 Anxiety disorder, unspecified: Secondary | ICD-10-CM

## 2020-04-25 DIAGNOSIS — F331 Major depressive disorder, recurrent, moderate: Secondary | ICD-10-CM | POA: Diagnosis not present

## 2020-04-25 DIAGNOSIS — F4310 Post-traumatic stress disorder, unspecified: Secondary | ICD-10-CM | POA: Diagnosis not present

## 2020-04-25 NOTE — Progress Notes (Signed)
Virtual Visit via Video Note  I connected with Mark [name] on 04/21/20 at 4:00pm EST by video-enabled telemedicine with the correct person using two identifiers.   I discussed the limitations of evaluation and management by telemedicine and the availability of in person appointments. The patient expressed understanding and agreed to proceed.  LOCATION: Patient: Home Provider: home office    History of Present Illness: Pt was referred to [facility] OP therapy for PTSD, depression and anxiety by the [facility].   Treatment Goal: Verbalize positive and hopeful feelings of the future.      Observations/Objective: Patient presented for today'[name] session on time and was alert, oriented x5, with no evidence or self-report of SI/HI or A/V H.  Patient reported ongoing compliance with medication. Clinician inquired about patient'[name] current emotional ratings, as well as any significant changes in thoughts, feelings or behavior since previous session.  Patient reported scores of 7/10 for depression, 7/10 for anxiety, 7/10 for anger/irritability. Clinician utilized MI OARS to reflect and summarize his thoughts and feelings about this new normal life he is experiencing,. Clinician discussed the importance of focusing on the here and now, what he can control, rather than what he cannot. Cln explored with pt his triggers he continues to experience with family relationships, health issues, work. Cln explored with pt EFT tapping, suggesting to use as a coping skill for his triggers.   Plan: screenings ?          PLAN: Childhood relationship with mother      Assessment and Plan: Counselor will continue to meet with patient to address treatment plan goals. Patient will continue to follow recommendations of providers and implement skills learned in session.  Follow Up Instructions: I discussed the assessment and treatment plan with the patient. The patient was provided an opportunity to ask questions and  all were answered. The patient agreed with the plan and demonstrated an understanding of the instructions.   The patient was advised to call back or seek an in-person evaluation if the symptoms worsen or if the condition fails to improve as anticipated.  I provided 45 minutes of non-face-to-face time during this encounter.   Mark [name], LCAS  .

## 2020-04-25 NOTE — Progress Notes (Signed)
Virtual Visit via Video Note  I connected with [name] on 04/25/20 at 5:00 pm EST by a video enabled telemedicine application and verified that I am speaking with the correct person using two identifiers.   I discussed the limitations of evaluation and management by telemedicine and the availability of in person appointments. The patient expressed understanding and agreed to proceed.  LOCATION: Patient: Home Provider: home Office  History of Present Illness: [facility], depression, [facility].    Observation/Objective:   Patient engaged in discussion on identified positives to assess how mood has been impacted. Clinician allowed space for group to further process emotions and clinician provided supportive statements throughout session for each patient. It was suggested to patient to continue to identify positives that  impact mood.    Assessment and Plan: Counselor will continue to meet with patient address treatment plan goals. Patient recommendations of providers and implement skill.   Follow Up Instructions Patient engaged in discussion on identified positives to assess how they have impacted mood. Clinician allowed space for group to further process emotions and clinician provided supportive statements throughout session for each patient. It was suggested to patient to continue to identify positives that have impacted mood.   Follow-up instructions: I discussed the assessment and treatment plan with the patient. The patient was provided an opportunity to ask questions and all were answered. The patient agreed with the plan and demonstrated an understanding of the instructions.   The patient was advised to call back or seek an in-person evaluation if the symptoms worsen or if the condition fails to improve as anticipated.  I provided 90 minutes of non-face-to-face time during this encounter.   Rylon Poitra S, LCAS

## 2020-04-28 ENCOUNTER — Other Ambulatory Visit: Payer: Self-pay

## 2020-04-28 ENCOUNTER — Ambulatory Visit (INDEPENDENT_AMBULATORY_CARE_PROVIDER_SITE_OTHER): Payer: No Typology Code available for payment source | Admitting: Licensed Clinical Social Worker

## 2020-04-28 ENCOUNTER — Encounter (HOSPITAL_COMMUNITY): Payer: Self-pay | Admitting: Licensed Clinical Social Worker

## 2020-04-28 DIAGNOSIS — F4310 Post-traumatic stress disorder, unspecified: Secondary | ICD-10-CM | POA: Diagnosis not present

## 2020-04-28 DIAGNOSIS — F419 Anxiety disorder, unspecified: Secondary | ICD-10-CM

## 2020-04-28 DIAGNOSIS — F331 Major depressive disorder, recurrent, moderate: Secondary | ICD-10-CM | POA: Diagnosis not present
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
## 2020-05-02 ENCOUNTER — Encounter (HOSPITAL_COMMUNITY): Payer: Self-pay | Admitting: Licensed Clinical Social Worker

## 2020-05-02 ENCOUNTER — Ambulatory Visit (INDEPENDENT_AMBULATORY_CARE_PROVIDER_SITE_OTHER): Payer: No Typology Code available for payment source | Admitting: Licensed Clinical Social Worker

## 2020-05-02 ENCOUNTER — Other Ambulatory Visit: Payer: Self-pay

## 2020-05-02 DIAGNOSIS — F331 Major depressive disorder, recurrent, moderate: Secondary | ICD-10-CM | POA: Diagnosis not present

## 2020-05-02 DIAGNOSIS — F4310 Post-traumatic stress disorder, unspecified: Secondary | ICD-10-CM | POA: Diagnosis not present

## 2020-05-02 DIAGNOSIS — F419 Anxiety disorder, unspecified: Secondary | ICD-10-CM | POA: Diagnosis not present

## 2020-05-02 NOTE — Progress Notes (Signed)
Virtual Visit via Video Note  I connected with Mark [name] on 04/25/20 at 5:00 pm EST by a video enabled telemedicine application and verified that I am speaking with the correct person using two identifiers.   I discussed the limitations of evaluation and management by telemedicine and the availability of in person appointments. The patient expressed understanding and agreed to proceed.  LOCATION: Patient: Home Provider: home Office  History of Present Illness: [facility]   Observation/Objective:     Pt participated in a discussion on  family dysfunction. Clinician  assessed patient's family  dynamics and discussed how the  dynamics influence mental health  symptoms in a negative way.  Patient was encouraged to work on I mprove family function.       Assessment and Plan: Counselor will continue to meet with patient address treatment plan goals. Patient recommendations of providers and implement skill.      Follow-up instructions: I discussed the assessment and treatment plan with the patient. The patient was provided an opportunity to ask questions and all were answered. The patient agreed with the plan and demonstrated an understanding of the instructions.   The patient was advised to call back or seek an in-person evaluation if the symptoms worsen or if the condition fails to improve as anticipated.  I provided 120 minutes of non-face-to-face time during this encounter.   Tanieka Pownall S, LCAS

## 2020-05-05 ENCOUNTER — Ambulatory Visit (HOSPITAL_COMMUNITY): Payer: No Typology Code available for payment source | Admitting: Licensed Clinical Social Worker

## 2020-05-05 ENCOUNTER — Ambulatory Visit (INDEPENDENT_AMBULATORY_CARE_PROVIDER_SITE_OTHER): Payer: No Typology Code available for payment source | Admitting: Licensed Clinical Social Worker

## 2020-05-05 ENCOUNTER — Other Ambulatory Visit: Payer: Self-pay

## 2020-05-05 ENCOUNTER — Encounter (HOSPITAL_COMMUNITY): Payer: Self-pay | Admitting: Licensed Clinical Social Worker

## 2020-05-05 DIAGNOSIS — F331 Major depressive disorder, recurrent, moderate: Secondary | ICD-10-CM | POA: Diagnosis not present

## 2020-05-05 DIAGNOSIS — F419 Anxiety disorder, unspecified: Secondary | ICD-10-CM

## 2020-05-05 DIAGNOSIS — F4310 Post-traumatic stress disorder, unspecified: Secondary | ICD-10-CM

## 2020-05-05 NOTE — Progress Notes (Signed)
Virtual Visit via Video Note  I connected with Mark [name] on 05/05/20 at 2:00pm EST by video-enabled telemedicine with the correct person using two identifiers.   I discussed the limitations of evaluation and management by telemedicine and the availability of in person appointments. The patient expressed understanding and agreed to proceed.  LOCATION: Patient: Home Provider: home office    History of Present Illness: Pt was referred to [facility] OP therapy for PTSD, depression and anxiety by the [facility].   Treatment Goal: Verbalize positive and hopeful feelings of the future.      Observations/Objective: Patient presented for today's session on time and was alert, oriented x5, with no evidence or self-report of SI/HI or A/V H.  Patient reported ongoing compliance with medication. Clinician inquired about patient's current emotional ratings, as well as any significant changes in thoughts, feelings or behavior since previous session.  Patient reported scores of 7/10 for depression, 7/10 for anxiety, 7/10 for anger/irritability. Patient reports on his emotional ratings. Cln explored causes with pt of his mixed moods: family, expectations, work, health issues. Patient relayed that he notices good moments and really bad ones that flare his anxiety and identified conflict with his family as a significant trigger for his mixed moods. Cln modeled EFT tapping for pt who used this in session.  Plan: screenings ?  Assessment and Plan: Counselor will continue to meet with patient to address treatment plan goals. Patient will continue to follow recommendations of providers and implement skills learned in session.  Follow Up Instructions: I discussed the assessment and treatment plan with the patient. The patient was provided an opportunity to ask questions and all were answered. The patient agreed with the plan and demonstrated an understanding of the instructions.   The patient was advised to call  back or seek an in-person evaluation if the symptoms worsen or if the condition fails to improve as anticipated.  I provided 60 minutes of non-face-to-face time during this encounter.   Shera Laubach S, LCAS  .

## 2020-05-09 ENCOUNTER — Ambulatory Visit (INDEPENDENT_AMBULATORY_CARE_PROVIDER_SITE_OTHER): Payer: No Typology Code available for payment source | Admitting: Licensed Clinical Social Worker

## 2020-05-09 ENCOUNTER — Other Ambulatory Visit: Payer: Self-pay

## 2020-05-09 ENCOUNTER — Encounter (HOSPITAL_COMMUNITY): Payer: Self-pay | Admitting: Licensed Clinical Social Worker

## 2020-05-09 DIAGNOSIS — F4310 Post-traumatic stress disorder, unspecified: Secondary | ICD-10-CM | POA: Diagnosis not present

## 2020-05-09 DIAGNOSIS — F331 Major depressive disorder, recurrent, moderate: Secondary | ICD-10-CM

## 2020-05-09 DIAGNOSIS — F419 Anxiety disorder, unspecified: Secondary | ICD-10-CM

## 2020-05-09 NOTE — Progress Notes (Signed)
Virtual Visit via Video Note  I connected with Mark [name] on 05/09/20 at 5:00 pm EST by a video enabled telemedicine application and verified that I am speaking with the correct person using two identifiers.   I discussed the limitations of evaluation and management by telemedicine and the availability of in person appointments. The patient expressed understanding and agreed to proceed.  LOCATION: Patient: Home Provider: home Office  History of Present Illness: [facility], [facility].   Observation/Objective:      Patient participated in a  discussion on stress. Clinician  provided psychoeducation on  mindfulness, improving emotional  regulation which leads to a better  mood and better ability to handle  stress and helps to develop  personal coping strategies that  target problem solving. Patient was  encouraged to use mindfulness  skills to assist with improving  emotional regulation, better mood  and better ability to handle stress.        Assessment and Plan: Counselor will continue to meet with patient address treatment plan goals. Patient recommendations of providers and implement skill.      Follow-up instructions: I discussed the assessment and treatment plan with the patient. The patient was provided an opportunity to ask questions and all were answered. The patient agreed with the plan and demonstrated an understanding of the instructions.   The patient was advised to call back or seek an in-person evaluation if the symptoms worsen or if the condition fails to improve as anticipated.  I provided 120 minutes of non-face-to-face time during this encounter.   Amani Nodarse S, LCAS

## 2020-05-12 ENCOUNTER — Other Ambulatory Visit: Payer: Self-pay

## 2020-05-12 ENCOUNTER — Ambulatory Visit (HOSPITAL_COMMUNITY): Payer: No Typology Code available for payment source | Admitting: Licensed Clinical Social Worker

## 2020-05-16 ENCOUNTER — Other Ambulatory Visit: Payer: Self-pay

## 2020-05-16 ENCOUNTER — Ambulatory Visit (HOSPITAL_COMMUNITY): Payer: No Typology Code available for payment source | Admitting: Licensed Clinical Social Worker

## 2020-05-19 ENCOUNTER — Ambulatory Visit (INDEPENDENT_AMBULATORY_CARE_PROVIDER_SITE_OTHER): Payer: No Typology Code available for payment source | Admitting: Licensed Clinical Social Worker

## 2020-05-19 ENCOUNTER — Encounter (HOSPITAL_COMMUNITY): Payer: Self-pay | Admitting: Licensed Clinical Social Worker

## 2020-05-19 ENCOUNTER — Other Ambulatory Visit: Payer: Self-pay

## 2020-05-19 DIAGNOSIS — F419 Anxiety disorder, unspecified: Secondary | ICD-10-CM

## 2020-05-19 DIAGNOSIS — F331 Major depressive disorder, recurrent, moderate: Secondary | ICD-10-CM

## 2020-05-19 DIAGNOSIS — F4310 Post-traumatic stress disorder, unspecified: Secondary | ICD-10-CM | POA: Diagnosis not present

## 2020-05-23 ENCOUNTER — Encounter (HOSPITAL_COMMUNITY): Payer: Self-pay | Admitting: Licensed Clinical Social Worker

## 2020-05-23 ENCOUNTER — Other Ambulatory Visit: Payer: Self-pay

## 2020-05-23 ENCOUNTER — Ambulatory Visit (INDEPENDENT_AMBULATORY_CARE_PROVIDER_SITE_OTHER): Payer: No Typology Code available for payment source | Admitting: Licensed Clinical Social Worker

## 2020-05-23 DIAGNOSIS — F331 Major depressive disorder, recurrent, moderate: Secondary | ICD-10-CM
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
## 2020-05-23 NOTE — Progress Notes (Signed)
Virtual Visit via Video Note  I connected with Mark [name] on 05/19/20 at 4:00pm EST by video-enabled telemedicine with the correct person using two identifiers.   I discussed the limitations of evaluation and management by telemedicine and the availability of in person appointments. The patient expressed understanding and agreed to proceed.  LOCATION: Patient: Home Provider: home office    History of Present Illness: Pt was referred to [facility] OP therapy for PTSD, depression and anxiety by the [facility].   Treatment Goal: Verbalize positive and hopeful feelings of the future.      Observations/Objective: Patient presented for today's session on time and was alert, oriented x5, with no evidence or self-report of SI/HI or A/V H.  Patient reported ongoing compliance with medication. Clinician inquired about patient's current emotional ratings, as well as any significant changes in thoughts, feelings or behavior since previous session.  Patient reported scores of 7/10 for depression, 7/10 for anxiety, 7/10 for anger/irritability. Patient reports on his emotional ratings. Cln explored causes with pt of his mixed moods: family, expectations, work, health issues. Patient reports he went to [facility] for a family funeral. "it really increased all my symptoms being with family." Pt reports he had to use his tapping intervention a lot. Cln provided pscyhoeducation on EMDR music. Pt practiced in session.   Plan: screenings ?  Assessment and Plan: Counselor will continue to meet with patient to address treatment plan goals. Patient will continue to follow recommendations of providers and implement skills learned in session.  Follow Up Instructions: I discussed the assessment and treatment plan with the patient. The patient was provided an opportunity to ask questions and all were answered. The patient agreed with the plan and demonstrated an understanding of the instructions.   The patient was  advised to call back or seek an in-person evaluation if the symptoms worsen or if the condition fails to improve as anticipated.  I provided 30 minutes of non-face-to-face time during this encounter.   Ceceilia Cephus S, LCAS  .

## 2020-05-25 ENCOUNTER — Other Ambulatory Visit: Payer: Self-pay

## 2020-05-25 ENCOUNTER — Encounter (HOSPITAL_COMMUNITY): Payer: Self-pay | Admitting: Licensed Clinical Social Worker

## 2020-05-25 ENCOUNTER — Ambulatory Visit (INDEPENDENT_AMBULATORY_CARE_PROVIDER_SITE_OTHER): Payer: No Typology Code available for payment source | Admitting: Licensed Clinical Social Worker

## 2020-05-25 DIAGNOSIS — F4310 Post-traumatic stress disorder, unspecified: Secondary | ICD-10-CM | POA: Diagnosis not present

## 2020-05-25 DIAGNOSIS — F419 Anxiety disorder, unspecified: Secondary | ICD-10-CM | POA: Diagnosis not present

## 2020-05-25 DIAGNOSIS — F331 Major depressive disorder, recurrent, moderate: Secondary | ICD-10-CM

## 2020-05-25 NOTE — Progress Notes (Signed)
Virtual Visit via Video Note  I connected with Mark [name] on 05/25/20 at 12:00pm EST by video-enabled telemedicine with the correct person using two identifiers.   I discussed the limitations of evaluation and management by telemedicine and the availability of in person appointments. The patient expressed understanding and agreed to proceed.  LOCATION: Patient: Home Provider: home office    History of Present Illness: Pt was referred to [facility] OP therapy for PTSD, depression and anxiety by the [facility].   Treatment Goal: Verbalize positive and hopeful feelings of the future.      Observations/Objective: Patient presented for today's session on time and was alert, oriented x5, with no evidence or self-report of SI/HI or A/V H.  Patient reported ongoing compliance with medication. Clinician inquired about patient's current emotional ratings, as well as any significant changes in thoughts, feelings or behavior since previous session.  Patient reported scores of 7/10 for depression, 7/10 for anxiety, 7/10 for anger/irritability. Patient discussed his emotional ratings, upcoming stressors: family, expectations. Pt is going to [place] for his daughter's graduation where there will be family, pressure, expectations. Cln role played possible scenarios with pt.where he practiced his coping skills. Pt discussed his concerns about the upcoming weekend. Cln used effective listen skills as patient described his concerns. Cln validated his feelings and suggested he practice coping skills.  Plan: screenings ?  Assessment and Plan: Counselor will continue to meet with patient to address treatment plan goals. Patient will continue to follow recommendations of providers and implement skills learned in session.  Follow Up Instructions: I discussed the assessment and treatment plan with the patient. The patient was provided an opportunity to ask questions and all were answered. The patient agreed with  the plan and demonstrated an understanding of the instructions.   The patient was advised to call back or seek an in-person evaluation if the symptoms worsen or if the condition fails to improve as anticipated.  I provided 30 minutes of non-face-to-face time during this encounter.   Signa Cheek S, LCAS  .

## 2020-05-26 ENCOUNTER — Ambulatory Visit (HOSPITAL_COMMUNITY): Payer: Medicare HMO | Admitting: Licensed Clinical Social Worker

## 2020-05-30 ENCOUNTER — Other Ambulatory Visit: Payer: Self-pay

## 2020-05-30 ENCOUNTER — Ambulatory Visit (HOSPITAL_COMMUNITY): Payer: No Typology Code available for payment source | Admitting: Licensed Clinical Social Worker

## 2020-06-02 ENCOUNTER — Ambulatory Visit (HOSPITAL_COMMUNITY): Payer: Medicare HMO | Admitting: Licensed Clinical Social Worker

## 2020-06-02 ENCOUNTER — Other Ambulatory Visit: Payer: Self-pay

## 2020-06-03 DIAGNOSIS — X58XXXA Exposure to other specified factors, initial encounter: Secondary | ICD-10-CM | POA: Diagnosis not present

## 2020-06-03 DIAGNOSIS — Z89611 Acquired absence of right leg above knee: Secondary | ICD-10-CM | POA: Diagnosis not present

## 2020-06-03 DIAGNOSIS — E119 Type 2 diabetes mellitus without complications: Secondary | ICD-10-CM | POA: Diagnosis not present

## 2020-06-03 DIAGNOSIS — S52371A Galeazzi's fracture of right radius, initial encounter for closed fracture: Secondary | ICD-10-CM | POA: Diagnosis not present

## 2020-06-03 DIAGNOSIS — S52591A Other fractures of lower end of right radius, initial encounter for closed fracture: Secondary | ICD-10-CM | POA: Diagnosis not present

## 2020-06-06 ENCOUNTER — Other Ambulatory Visit: Payer: Self-pay

## 2020-06-06 ENCOUNTER — Ambulatory Visit (HOSPITAL_COMMUNITY): Payer: No Typology Code available for payment source | Admitting: Licensed Clinical Social Worker

## 2020-06-09 ENCOUNTER — Encounter (HOSPITAL_COMMUNITY): Payer: Self-pay | Admitting: Licensed Clinical Social Worker

## 2020-06-09 ENCOUNTER — Ambulatory Visit (INDEPENDENT_AMBULATORY_CARE_PROVIDER_SITE_OTHER): Payer: No Typology Code available for payment source | Admitting: Licensed Clinical Social Worker

## 2020-06-09 ENCOUNTER — Other Ambulatory Visit: Payer: Self-pay

## 2020-06-09 DIAGNOSIS — F331 Major depressive disorder, recurrent, moderate: Secondary | ICD-10-CM

## 2020-06-09 DIAGNOSIS — F419 Anxiety disorder, unspecified: Secondary | ICD-10-CM

## 2020-06-09 DIAGNOSIS — F4310 Post-traumatic stress disorder, unspecified: Secondary | ICD-10-CM
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
## 2020-06-13 ENCOUNTER — Ambulatory Visit (INDEPENDENT_AMBULATORY_CARE_PROVIDER_SITE_OTHER): Payer: No Typology Code available for payment source | Admitting: Licensed Clinical Social Worker

## 2020-06-13 ENCOUNTER — Other Ambulatory Visit: Payer: Self-pay

## 2020-06-13 ENCOUNTER — Encounter (HOSPITAL_COMMUNITY): Payer: Self-pay | Admitting: Licensed Clinical Social Worker

## 2020-06-13 DIAGNOSIS — F4310 Post-traumatic stress disorder, unspecified: Secondary | ICD-10-CM

## 2020-06-13 DIAGNOSIS — F419 Anxiety disorder, unspecified: Secondary | ICD-10-CM

## 2020-06-13 DIAGNOSIS — F331 Major depressive disorder, recurrent, moderate: Secondary | ICD-10-CM | POA: Diagnosis not present

## 2020-06-13 NOTE — Progress Notes (Signed)
Virtual Visit via Video Note  I connected with Mark [name] on 06/13/20 at 5:00 pm EST by a video enabled telemedicine application and verified that I am speaking with the correct person using two identifiers.   I discussed the limitations of evaluation and management by telemedicine and the availability of in person appointments. The patient expressed understanding and agreed to proceed.  LOCATION: Patient: Home Provider: home Office  History of Present Illness: [facility]   Observation/Objective:      Patient participated in a discussion on Memorial Day celebrations: honoring and mourning fallen soldiers, putting flags on fellow veteran'[name] graves, attending [employer], rendering respect of fellow soldiers. Clinician honored each veteran in this group for their respective service in the [employer].       Assessment and Plan: Counselor will continue to meet with patient address treatment plan goals. Patient recommendations of providers and implement skill.      Follow-up instructions: I discussed the assessment and treatment plan with the patient. The patient was provided an opportunity to ask questions and all were answered. The patient agreed with the plan and demonstrated an understanding of the instructions.   The patient was advised to call back or seek an in-person evaluation if the symptoms worsen or if the condition fails to improve as anticipated.  I provided 120 minutes of non-face-to-face time during this encounter.   Mark [name], LCAS

## 2020-06-16 ENCOUNTER — Other Ambulatory Visit: Payer: Self-pay

## 2020-06-16 ENCOUNTER — Ambulatory Visit (HOSPITAL_COMMUNITY): Payer: Medicare HMO | Admitting: Licensed Clinical Social Worker

## 2020-06-16 ENCOUNTER — Ambulatory Visit (INDEPENDENT_AMBULATORY_CARE_PROVIDER_SITE_OTHER): Payer: No Typology Code available for payment source | Admitting: Licensed Clinical Social Worker

## 2020-06-16 DIAGNOSIS — F331 Major depressive disorder, recurrent, moderate: Secondary | ICD-10-CM | POA: Diagnosis not present

## 2020-06-16 DIAGNOSIS — F4310 Post-traumatic stress disorder, unspecified: Secondary | ICD-10-CM | POA: Diagnosis not present

## 2020-06-16 DIAGNOSIS — F419 Anxiety disorder, unspecified: Secondary | ICD-10-CM

## 2020-06-20 ENCOUNTER — Ambulatory Visit (HOSPITAL_COMMUNITY): Payer: No Typology Code available for payment source | Admitting: Licensed Clinical Social Worker

## 2020-06-22 ENCOUNTER — Encounter (HOSPITAL_COMMUNITY): Payer: Self-pay | Admitting: Licensed Clinical Social Worker
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
## 2020-06-23 ENCOUNTER — Other Ambulatory Visit: Payer: Self-pay

## 2020-06-23 ENCOUNTER — Encounter (HOSPITAL_COMMUNITY): Payer: Self-pay | Admitting: Licensed Clinical Social Worker

## 2020-06-23 ENCOUNTER — Ambulatory Visit (INDEPENDENT_AMBULATORY_CARE_PROVIDER_SITE_OTHER): Payer: No Typology Code available for payment source | Admitting: Licensed Clinical Social Worker

## 2020-06-23 DIAGNOSIS — F4310 Post-traumatic stress disorder, unspecified: Secondary | ICD-10-CM | POA: Diagnosis not present

## 2020-06-23 DIAGNOSIS — F331 Major depressive disorder, recurrent, moderate: Secondary | ICD-10-CM

## 2020-06-23 DIAGNOSIS — F419 Anxiety disorder, unspecified: Secondary | ICD-10-CM
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
## 2020-06-27 ENCOUNTER — Ambulatory Visit (HOSPITAL_COMMUNITY): Payer: No Typology Code available for payment source | Admitting: Licensed Clinical Social Worker

## 2020-06-27 ENCOUNTER — Other Ambulatory Visit: Payer: Self-pay

## 2020-06-30 ENCOUNTER — Other Ambulatory Visit: Payer: Self-pay

## 2020-06-30 ENCOUNTER — Ambulatory Visit (INDEPENDENT_AMBULATORY_CARE_PROVIDER_SITE_OTHER): Payer: No Typology Code available for payment source | Admitting: Licensed Clinical Social Worker

## 2020-06-30 DIAGNOSIS — F331 Major depressive disorder, recurrent, moderate: Secondary | ICD-10-CM

## 2020-06-30 DIAGNOSIS — F419 Anxiety disorder, unspecified: Secondary | ICD-10-CM | POA: Diagnosis not present

## 2020-06-30 DIAGNOSIS — F4310 Post-traumatic stress disorder, unspecified: Secondary | ICD-10-CM

## 2020-07-04 ENCOUNTER — Ambulatory Visit (INDEPENDENT_AMBULATORY_CARE_PROVIDER_SITE_OTHER): Payer: No Typology Code available for payment source | Admitting: Licensed Clinical Social Worker

## 2020-07-04 ENCOUNTER — Encounter (HOSPITAL_COMMUNITY): Payer: Self-pay | Admitting: Licensed Clinical Social Worker

## 2020-07-04 ENCOUNTER — Other Ambulatory Visit: Payer: Self-pay

## 2020-07-04 DIAGNOSIS — F4310 Post-traumatic stress disorder, unspecified: Secondary | ICD-10-CM | POA: Diagnosis not present

## 2020-07-04 DIAGNOSIS — F331 Major depressive disorder, recurrent, moderate: Secondary | ICD-10-CM

## 2020-07-04 DIAGNOSIS — F419 Anxiety disorder, unspecified: Secondary | ICD-10-CM
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
## 2020-07-04 NOTE — Progress Notes (Signed)
Virtual Visit via Video Note  I connected with Mark [name] on 06/13/20 at 5:00 pm EST by a video enabled telemedicine application and verified that I am speaking with the correct person using two identifiers.   I discussed the limitations of evaluation and management by telemedicine and the availability of in person appointments. The patient expressed understanding and agreed to proceed.  LOCATION: Patient: Home Provider: home Office  History of Present Illness: [facility]   Observation/Objective:     Patient participated in a group discussion on summer holiday plans. Clinician provided psychoeducation on the importance of self-care. Clinician encouraged patient to plan a vacation/staycation for the summer for self-care. Patient was in agreement of suggestion.       Assessment and Plan: Counselor will continue to meet with patient address treatment plan goals. Patient recommendations of providers and implement skill.      Follow-up instructions: I discussed the assessment and treatment plan with the patient. The patient was provided an opportunity to ask questions and all were answered. The patient agreed with the plan and demonstrated an understanding of the instructions.   The patient was advised to call back or seek an in-person evaluation if the symptoms worsen or if the condition fails to improve as anticipated.  I provided 30 minutes of non-face-to-face time during this encounter.   Ahana Najera S, LCAS

## 2020-07-07 ENCOUNTER — Other Ambulatory Visit: Payer: Self-pay

## 2020-07-07 ENCOUNTER — Ambulatory Visit (INDEPENDENT_AMBULATORY_CARE_PROVIDER_SITE_OTHER): Payer: No Typology Code available for payment source | Admitting: Licensed Clinical Social Worker

## 2020-07-07 ENCOUNTER — Encounter (HOSPITAL_COMMUNITY): Payer: Self-pay | Admitting: Licensed Clinical Social Worker

## 2020-07-07 DIAGNOSIS — F331 Major depressive disorder, recurrent, moderate: Secondary | ICD-10-CM | POA: Diagnosis not present

## 2020-07-07 DIAGNOSIS — F419 Anxiety disorder, unspecified: Secondary | ICD-10-CM | POA: Diagnosis not present

## 2020-07-07 DIAGNOSIS — F4310 Post-traumatic stress disorder, unspecified: Secondary | ICD-10-CM | POA: Diagnosis not present
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
## 2020-07-11 ENCOUNTER — Other Ambulatory Visit: Payer: Self-pay

## 2020-07-11 ENCOUNTER — Ambulatory Visit (INDEPENDENT_AMBULATORY_CARE_PROVIDER_SITE_OTHER): Payer: No Typology Code available for payment source | Admitting: Licensed Clinical Social Worker

## 2020-07-11 ENCOUNTER — Encounter (HOSPITAL_COMMUNITY): Payer: Self-pay | Admitting: Licensed Clinical Social Worker

## 2020-07-11 DIAGNOSIS — F4310 Post-traumatic stress disorder, unspecified: Secondary | ICD-10-CM

## 2020-07-11 DIAGNOSIS — F331 Major depressive disorder, recurrent, moderate: Secondary | ICD-10-CM | POA: Diagnosis not present

## 2020-07-11 DIAGNOSIS — F419 Anxiety disorder, unspecified: Secondary | ICD-10-CM | POA: Diagnosis not present

## 2020-07-11 NOTE — Progress Notes (Signed)
Virtual Visit via Video Note  I connected with Mark [name] on 07/11/20 at 5:00 pm EST by a video enabled telemedicine application and verified that I am speaking with the correct person using two identifiers.   I discussed the limitations of evaluation and management by telemedicine and the availability of in person appointments. The patient expressed understanding and agreed to proceed.  LOCATION: Patient: Home Provider: home Office  History of Present Illness: [facility], [facility], [facility].   Observation/Objective:     Patient participated in a discussion on introspection, looking inward, self-examination, reflection, and inspection of thoughts. Patient described self-reflection to describe his reflective thoughts. Patient was encouraged to continue using introspection.     Assessment and Plan: Counselor will continue to meet with patient address treatment plan goals. Patient recommendations of providers and implement skill.      Follow-up instructions: I discussed the assessment and treatment plan with the patient. The patient was provided an opportunity to ask questions and all were answered. The patient agreed with the plan and demonstrated an understanding of the instructions.   The patient was advised to call back or seek an in-person evaluation if the symptoms worsen or if the condition fails to improve as anticipated.  I provided 90 minutes of non-face-to-face time during this encounter.   Elgin Carn S, LCAS

## 2020-07-14 ENCOUNTER — Encounter (HOSPITAL_COMMUNITY): Payer: Self-pay | Admitting: Licensed Clinical Social Worker

## 2020-07-14 ENCOUNTER — Other Ambulatory Visit: Payer: Self-pay

## 2020-07-14 ENCOUNTER — Ambulatory Visit (INDEPENDENT_AMBULATORY_CARE_PROVIDER_SITE_OTHER): Payer: No Typology Code available for payment source | Admitting: Licensed Clinical Social Worker

## 2020-07-14 DIAGNOSIS — F331 Major depressive disorder, recurrent, moderate: Secondary | ICD-10-CM

## 2020-07-14 DIAGNOSIS — F4310 Post-traumatic stress disorder, unspecified: Secondary | ICD-10-CM | POA: Diagnosis not present

## 2020-07-14 DIAGNOSIS — F419 Anxiety disorder, unspecified: Secondary | ICD-10-CM | POA: Diagnosis not present
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
## 2020-07-18 ENCOUNTER — Other Ambulatory Visit: Payer: Self-pay

## 2020-07-18 ENCOUNTER — Encounter (HOSPITAL_COMMUNITY): Payer: Self-pay | Admitting: Licensed Clinical Social Worker

## 2020-07-18 ENCOUNTER — Ambulatory Visit (INDEPENDENT_AMBULATORY_CARE_PROVIDER_SITE_OTHER): Payer: No Typology Code available for payment source | Admitting: Licensed Clinical Social Worker

## 2020-07-18 DIAGNOSIS — F331 Major depressive disorder, recurrent, moderate: Secondary | ICD-10-CM

## 2020-07-18 DIAGNOSIS — F4310 Post-traumatic stress disorder, unspecified: Secondary | ICD-10-CM | POA: Diagnosis not present

## 2020-07-18 DIAGNOSIS — F419 Anxiety disorder, unspecified: Secondary | ICD-10-CM | POA: Diagnosis not present

## 2020-07-18 NOTE — Progress Notes (Signed)
Virtual Visit via Video Note  I connected with Mark [name] on 07/18/20 at 5:00 pm EST by a video enabled telemedicine application and verified that I am speaking with the correct person using two identifiers.   I discussed the limitations of evaluation and management by telemedicine and the availability of in person appointments. The patient expressed understanding and agreed to proceed.  LOCATION: Patient: Home Provider: home Office  History of Present Illness: [facility], [facility].   Observation/Objective:     Patient participated in a discussion on overcoming life's challenges, under stress, which makes it more difficult to overcome obstacles in life. Patient was encouraged to identify life challenges and explore strategies for overcoming obstacles.    Assessment and Plan: Counselor will continue to meet with patient address treatment plan goals. Patient recommendations of providers and implement skill.      Follow-up instructions: I discussed the assessment and treatment plan with the patient. The patient was provided an opportunity to ask questions and all were answered. The patient agreed with the plan and demonstrated an understanding of the instructions.   The patient was advised to call back or seek an in-person evaluation if the symptoms worsen or if the condition fails to improve as anticipated.  I provided 90 minutes of non-face-to-face time during this encounter.   Mark [name] S, LCAS

## 2020-07-21 ENCOUNTER — Ambulatory Visit (INDEPENDENT_AMBULATORY_CARE_PROVIDER_SITE_OTHER): Payer: No Typology Code available for payment source | Admitting: Licensed Clinical Social Worker

## 2020-07-21 ENCOUNTER — Encounter (HOSPITAL_COMMUNITY): Payer: Self-pay | Admitting: Licensed Clinical Social Worker

## 2020-07-21 ENCOUNTER — Other Ambulatory Visit: Payer: Self-pay

## 2020-07-21 DIAGNOSIS — F419 Anxiety disorder, unspecified: Secondary | ICD-10-CM | POA: Diagnosis not present

## 2020-07-21 DIAGNOSIS — F331 Major depressive disorder, recurrent, moderate: Secondary | ICD-10-CM

## 2020-07-21 DIAGNOSIS — F4310 Post-traumatic stress disorder, unspecified: Secondary | ICD-10-CM | POA: Diagnosis not present
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
## 2020-07-25 ENCOUNTER — Ambulatory Visit (HOSPITAL_COMMUNITY): Payer: No Typology Code available for payment source | Admitting: Licensed Clinical Social Worker

## 2020-07-28 ENCOUNTER — Encounter (HOSPITAL_COMMUNITY): Payer: Self-pay | Admitting: Licensed Clinical Social Worker

## 2020-07-28 ENCOUNTER — Ambulatory Visit (INDEPENDENT_AMBULATORY_CARE_PROVIDER_SITE_OTHER): Payer: No Typology Code available for payment source | Admitting: Licensed Clinical Social Worker

## 2020-07-28 ENCOUNTER — Other Ambulatory Visit: Payer: Self-pay

## 2020-07-28 DIAGNOSIS — F4310 Post-traumatic stress disorder, unspecified: Secondary | ICD-10-CM

## 2020-07-28 DIAGNOSIS — F419 Anxiety disorder, unspecified: Secondary | ICD-10-CM

## 2020-07-28 DIAGNOSIS — F331 Major depressive disorder, recurrent, moderate: Secondary | ICD-10-CM

## 2020-07-28 NOTE — Progress Notes (Signed)
Virtual Visit via Video Note  I connected with Mark [name] on 07/28/20 at 2:00pm EST by video-enabled telemedicine with the correct person using two identifiers.   I discussed the limitations of evaluation and management by telemedicine and the availability of in person appointments. The patient expressed understanding and agreed to proceed.  LOCATION: Patient: home Provider: home office    History of Present Illness: Pt was referred to [facility] OP therapy for PTSD, depression and anxiety by the [facility].   Treatment Goal: Verbalize positive and hopeful feelings of the future.      Observations/Objective: Patient presented for today's session on time and was alert, oriented x5, with no evidence or self-report of SI/HI or A/V H.  Patient reported ongoing compliance with medication. Clinician inquired about patient's current emotional ratings, as well as any significant changes in thoughts, feelings or behavior since previous session.  Patient reported scores of 8/10 for depression, 8/10 for anxiety, 8/10 for anger/irritability. Patient reports his moods have gotten worse.  "I'm still here with my family waiting until my wrist is healed and I get my new electric scooter." Pt reports his feelings of hopelessness because "my family is not taking good care of me like they said they would."  Cln used CBT to assist pt in reframing his negative thoughts. Cln provided education on gratitude, suggesting pt journal his gratitudes.   Assessment and Plan: Counselor will continue to meet with patient to address treatment plan goals. Patient will continue to follow recommendations of providers and implement skills learned in session.   Follow Up Instructions: I discussed the assessment and treatment plan with the patient. The patient was provided an opportunity to ask questions and all were answered. The patient agreed with the plan and demonstrated an understanding of the instructions.   The patient was  advised to call back or seek an in-person evaluation if the symptoms worsen or if the condition fails to improve as anticipated.  I provided 60 minutes of non-face-to-face time during this encounter.   Smiley Birr S, LCAS  .

## 2020-08-01 ENCOUNTER — Ambulatory Visit (INDEPENDENT_AMBULATORY_CARE_PROVIDER_SITE_OTHER): Payer: No Typology Code available for payment source | Admitting: Licensed Clinical Social Worker

## 2020-08-01 ENCOUNTER — Other Ambulatory Visit: Payer: Self-pay

## 2020-08-01 ENCOUNTER — Encounter (HOSPITAL_COMMUNITY): Payer: Self-pay | Admitting: Licensed Clinical Social Worker

## 2020-08-01 DIAGNOSIS — F419 Anxiety disorder, unspecified: Secondary | ICD-10-CM | POA: Diagnosis not present

## 2020-08-01 DIAGNOSIS — F4310 Post-traumatic stress disorder, unspecified: Secondary | ICD-10-CM | POA: Diagnosis not present

## 2020-08-01 DIAGNOSIS — F331 Major depressive disorder, recurrent, moderate: Secondary | ICD-10-CM

## 2020-08-01 NOTE — Progress Notes (Signed)
Virtual Visit via Video Note  I connected with Mark [name] on 07/18/20 at 5:00 pm EST by a video enabled telemedicine application and verified that I am speaking with the correct person using two identifiers.   I discussed the limitations of evaluation and management by telemedicine and the availability of in person appointments. The patient expressed understanding and agreed to proceed.  LOCATION: Patient: Home Provider: home Office  History of Present Illness: [facility], depression, anxiety.   Observation/Objective:    Patient participated in a discussion on making healthy habits. Clinician provided psychoeducation on making healthy choices to make patients feel better and live longer.  Patient was encouraged to make healthy habits.    Assessment and Plan: Counselor will continue to meet with patient address treatment plan goals. Patient recommendations of providers and implement skill.    Follow-up instructions: I discussed the assessment and treatment plan with the patient. The patient was provided an opportunity to ask questions and all were answered. The patient agreed with the plan and demonstrated an understanding of the instructions.   The patient was advised to call back or seek an in-person evaluation if the symptoms worsen or if the condition fails to improve as anticipated.  I provided 90 minutes of non-face-to-face time during this encounter.   Anjelique Makar S, LCAS

## 2020-08-04 ENCOUNTER — Ambulatory Visit (INDEPENDENT_AMBULATORY_CARE_PROVIDER_SITE_OTHER): Payer: No Typology Code available for payment source | Admitting: Licensed Clinical Social Worker

## 2020-08-04 ENCOUNTER — Other Ambulatory Visit: Payer: Self-pay

## 2020-08-04 DIAGNOSIS — F331 Major depressive disorder, recurrent, moderate: Secondary | ICD-10-CM | POA: Diagnosis not present

## 2020-08-04 DIAGNOSIS — F419 Anxiety disorder, unspecified: Secondary | ICD-10-CM

## 2020-08-04 DIAGNOSIS — F4310 Post-traumatic stress disorder, unspecified: Secondary | ICD-10-CM | POA: Diagnosis not present

## 2020-08-08 ENCOUNTER — Other Ambulatory Visit: Payer: Self-pay

## 2020-08-08 ENCOUNTER — Ambulatory Visit (INDEPENDENT_AMBULATORY_CARE_PROVIDER_SITE_OTHER): Payer: No Typology Code available for payment source | Admitting: Licensed Clinical Social Worker

## 2020-08-08 ENCOUNTER — Encounter (HOSPITAL_COMMUNITY): Payer: Self-pay | Admitting: Licensed Clinical Social Worker

## 2020-08-08 DIAGNOSIS — F419 Anxiety disorder, unspecified: Secondary | ICD-10-CM

## 2020-08-08 DIAGNOSIS — F4310 Post-traumatic stress disorder, unspecified: Secondary | ICD-10-CM | POA: Diagnosis not present

## 2020-08-08 DIAGNOSIS — F331 Major depressive disorder, recurrent, moderate: Secondary | ICD-10-CM

## 2020-08-08 NOTE — Progress Notes (Signed)
Virtual Visit via Video Note  I connected with [name] on 07/18/20 at 5:00 pm EST by a video enabled telemedicine application and verified that I am speaking with the correct person using two identifiers.   I discussed the limitations of evaluation and management by telemedicine and the availability of in person appointments. The patient expressed understanding and agreed to proceed.  LOCATION: Patient: Home Provider: home Office  History of Present Illness: [facility] for [facility], depression, anxiety.   Observation/Objective:      Patient participated in a discussion on the necessity of medication adherence and how health issues can exacerbate mental illness. Mental health and physical health are fundamentally linked. Patient was encouraged to care for his physical health consequently taking care of his mental health.    Assessment and Plan: Counselor will continue to meet with patient address treatment plan goals. Patient recommendations of providers and implement skill.    Follow-up instructions: I discussed the assessment and treatment plan with the patient. The patient was provided an opportunity to ask questions and all were answered. The patient agreed with the plan and demonstrated an understanding of the instructions.   The patient was advised to call back or seek an in-person evaluation if the symptoms worsen or if the condition fails to improve as anticipated.  I provided 90 minutes of non-face-to-face time during this encounter.   Gurtej Noyola S, LCAS

## 2020-08-11 ENCOUNTER — Other Ambulatory Visit: Payer: Self-pay

## 2020-08-11 ENCOUNTER — Ambulatory Visit (INDEPENDENT_AMBULATORY_CARE_PROVIDER_SITE_OTHER): Payer: No Typology Code available for payment source | Admitting: Licensed Clinical Social Worker

## 2020-08-11 ENCOUNTER — Encounter (HOSPITAL_COMMUNITY): Payer: Self-pay | Admitting: Licensed Clinical Social Worker

## 2020-08-11 DIAGNOSIS — F331 Major depressive disorder, recurrent, moderate: Secondary | ICD-10-CM | POA: Diagnosis not present

## 2020-08-11 DIAGNOSIS — F419 Anxiety disorder, unspecified: Secondary | ICD-10-CM

## 2020-08-11 DIAGNOSIS — F4310 Post-traumatic stress disorder, unspecified: Secondary | ICD-10-CM | POA: Diagnosis not present

## 2020-08-11 NOTE — Progress Notes (Signed)
Virtual Visit via Phone Note  I connected with Mark [name] on 08/04/20 at 2:00pm EST by phone-enabled telemedicine with the correct person using two identifiers.   I discussed the limitations of evaluation and management by telemedicine and the availability of in person appointments. The patient expressed understanding and agreed to proceed.  LOCATION: Patient: home Provider: home office    History of Present Illness: Pt was referred to [facility] OP therapy for PTSD, depression and anxiety by the [facility].   Treatment Goal: Verbalize positive and hopeful feelings of the future.      Observations/Objective: Patient presented for today's session on time and was alert, oriented x5, with no evidence or self-report of SI/HI or A/V H.  Patient reported ongoing compliance with medication. Clinician inquired about patient's current emotional ratings, as well as any significant changes in thoughts, feelings or behavior since previous session.  Patient reported scores of 8/10 for depression, 8/10 for anxiety, 8/10 for anger/irritability. Patient reports his moods have been mixed.  "I'm still here with my family waiting until my wrist is healed and I get my new electric scooter. I'm not feeling hopeless as last week because I started journaling my gratitudes." Pt talks about his lack of privacy, inability to care for his personal needs, having to ask for help from family who doesn't want to help. Cln assessed the family dynamics. Cln provided psychoeducation on dynamics of healthy vs unhealthy family relationships associated with power and control. Cln validated and normalized patient's feelings while redirecting his focus to things/people that he can control,         Assessment and Plan: Counselor will continue to meet with patient to address treatment plan goals. Patient will continue to follow recommendations of providers and implement skills learned in session.   Follow Up Instructions: I  discussed the assessment and treatment plan with the patient. The patient was provided an opportunity to ask questions and all were answered. The patient agreed with the plan and demonstrated an understanding of the instructions.   The patient was advised to call back or seek an in-person evaluation if the symptoms worsen or if the condition fails to improve as anticipated.  I provided 60 minutes of non-face-to-face time during this encounter.   Monque Haggar S, LCAS  .

## 2020-08-15 ENCOUNTER — Ambulatory Visit (INDEPENDENT_AMBULATORY_CARE_PROVIDER_SITE_OTHER): Payer: No Typology Code available for payment source | Admitting: Licensed Clinical Social Worker

## 2020-08-15 ENCOUNTER — Encounter (HOSPITAL_COMMUNITY): Payer: Self-pay | Admitting: Licensed Clinical Social Worker

## 2020-08-15 ENCOUNTER — Other Ambulatory Visit: Payer: Self-pay

## 2020-08-15 DIAGNOSIS — F419 Anxiety disorder, unspecified: Secondary | ICD-10-CM | POA: Diagnosis not present

## 2020-08-15 DIAGNOSIS — F331 Major depressive disorder, recurrent, moderate: Secondary | ICD-10-CM

## 2020-08-15 DIAGNOSIS — F4310 Post-traumatic stress disorder, unspecified: Secondary | ICD-10-CM | POA: Diagnosis not present
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
## 2020-08-15 NOTE — Progress Notes (Signed)
Virtual Visit via Video Note  I connected with Mark [name] on 08/15/20 at 5:00 pm EST by a video enabled telemedicine application and verified that I am speaking with the correct person using two identifiers.   I discussed the limitations of evaluation and management by telemedicine and the availability of in person appointments. The patient expressed understanding and agreed to proceed.  LOCATION: Patient: Home Provider: home Office  History of Present Illness: [facility], depression, anxiety.   Observation/Objective:    Pt participated in a discussion on relationships. Cln provided psychoeducation on dynamics of healthy vs unhealthy relationships associated with power and control. Pt was encouraged to engage in healthy relationships, not toxic relationships.   Assessment and Plan: Counselor will continue to meet with patient address treatment plan goals. Patient recommendations of providers and implement skill.    Follow-up instructions: I discussed the assessment and treatment plan with the patient. The patient was provided an opportunity to ask questions and all were answered. The patient agreed with the plan and demonstrated an understanding of the instructions.   The patient was advised to call back or seek an in-person evaluation if the symptoms worsen or if the condition fails to improve as anticipated.  I provided 90 minutes of non-face-to-face time during this encounter.   Caitriona Sundquist S, LCAS

## 2020-08-18 ENCOUNTER — Encounter (HOSPITAL_COMMUNITY): Payer: Self-pay | Admitting: Licensed Clinical Social Worker

## 2020-08-18 ENCOUNTER — Other Ambulatory Visit: Payer: Self-pay

## 2020-08-18 ENCOUNTER — Ambulatory Visit (INDEPENDENT_AMBULATORY_CARE_PROVIDER_SITE_OTHER): Payer: No Typology Code available for payment source | Admitting: Licensed Clinical Social Worker

## 2020-08-18 DIAGNOSIS — F331 Major depressive disorder, recurrent, moderate: Secondary | ICD-10-CM | POA: Diagnosis not present

## 2020-08-18 DIAGNOSIS — F419 Anxiety disorder, unspecified: Secondary | ICD-10-CM

## 2020-08-18 DIAGNOSIS — F4310 Post-traumatic stress disorder, unspecified: Secondary | ICD-10-CM

## 2020-08-18 NOTE — Progress Notes (Signed)
Virtual Visit via Phone Note  I connected with Mark [name] on 08/18/20 at 2:00pm EST by phone-enabled telemedicine with the correct person using two identifiers.   I discussed the limitations of evaluation and management by telemedicine and the availability of in person appointments. The patient expressed understanding and agreed to proceed.  LOCATION: Patient: home Provider: home office    History of Present Illness: Pt was referred to [facility] OP therapy for PTSD, depression and anxiety by the [facility].   Treatment Goal: Verbalize positive and hopeful feelings of the future.      Observations/Objective: Patient presented for today's session on time and was alert, oriented x5, with no evidence or self-report of SI/HI or A/V H.  Patient reported ongoing compliance with medication. Clinician inquired about patient's current emotional ratings, as well as any significant changes in thoughts, feelings or behavior since previous session.  Patient reported scores of 8/10 for depression, 8/10 for anxiety, 8/10 for anger/irritability. Patient reports his moods have been low within the last week. "I'm still here with my family waiting until my wrist is healed. My family did not celebrate my 60th birthday. I took time off work and just slept because I was so depressed." Cln used Behavior Activation to show patient the need to increase his amount of positive reinforcement which should end negative behavior patterns that cause his depression to worsen. Cln provided education on replacing negative avoidant behaviors with new rewarding behaviors which will increases his positive reinforcement and reduces negative reinforcement. Cln provided a platform for pt to talk about his feelings about his 60th birthday without celebration.   Assessment and Plan: Counselor will continue to meet with patient to address treatment plan goals. Patient will continue to follow recommendations of providers and implement  skills learned in session.   Follow Up Instructions: I discussed the assessment and treatment plan with the patient. The patient was provided an opportunity to ask questions and all were answered. The patient agreed with the plan and demonstrated an understanding of the instructions.   The patient was advised to call back or seek an in-person evaluation if the symptoms worsen or if the condition fails to improve as anticipated.  I provided 45 minutes of non-face-to-face time during this encounter.   Verlyn Dannenberg S, LCAS  .

## 2020-08-22 ENCOUNTER — Other Ambulatory Visit: Payer: Self-pay

## 2020-08-22 ENCOUNTER — Ambulatory Visit (INDEPENDENT_AMBULATORY_CARE_PROVIDER_SITE_OTHER): Payer: No Typology Code available for payment source | Admitting: Licensed Clinical Social Worker

## 2020-08-22 DIAGNOSIS — F419 Anxiety disorder, unspecified: Secondary | ICD-10-CM

## 2020-08-22 DIAGNOSIS — F331 Major depressive disorder, recurrent, moderate: Secondary | ICD-10-CM | POA: Diagnosis not present

## 2020-08-22 DIAGNOSIS — F4310 Post-traumatic stress disorder, unspecified: Secondary | ICD-10-CM

## 2020-08-23 ENCOUNTER — Encounter (HOSPITAL_COMMUNITY): Payer: Self-pay | Admitting: Licensed Clinical Social Worker

## 2020-08-23 NOTE — Progress Notes (Signed)
Virtual Visit via Video Note  I connected with Mark [name] on 08/22/20 at 5:00 pm EST by a video enabled telemedicine application and verified that I am speaking with the correct person using two identifiers.   I discussed the limitations of evaluation and management by telemedicine and the availability of in person appointments. The patient expressed understanding and agreed to proceed.  LOCATION: Patient: Home Provider: home Office  History of Present Illness: [facility], depression, anxiety.   Observation/Objective:    Patient participated in a discussion on relationships (spousal, parent/child, intimate, partnership) where the group discussed effective communication, I-statements and fair fighting. Pt was encouraged to work on her current relationship using effective communication including I statements and fair fighting rules.     Assessment and Plan: Counselor will continue to meet with patient address treatment plan goals. Patient recommendations of providers and implement skill.    Follow-up instructions: I discussed the assessment and treatment plan with the patient. The patient was provided an opportunity to ask questions and all were answered. The patient agreed with the plan and demonstrated an understanding of the instructions.   The patient was advised to call back or seek an in-person evaluation if the symptoms worsen or if the condition fails to improve as anticipated.  I provided 75 minutes of non-face-to-face time during this encounter.   Mark [name] S, LCAS

## 2020-08-25 ENCOUNTER — Encounter (HOSPITAL_COMMUNITY): Payer: Self-pay | Admitting: Licensed Clinical Social Worker

## 2020-08-25 ENCOUNTER — Ambulatory Visit (INDEPENDENT_AMBULATORY_CARE_PROVIDER_SITE_OTHER): Payer: No Typology Code available for payment source | Admitting: Licensed Clinical Social Worker

## 2020-08-25 ENCOUNTER — Other Ambulatory Visit: Payer: Self-pay

## 2020-08-25 DIAGNOSIS — F419 Anxiety disorder, unspecified: Secondary | ICD-10-CM

## 2020-08-25 DIAGNOSIS — F4310 Post-traumatic stress disorder, unspecified: Secondary | ICD-10-CM | POA: Diagnosis not present

## 2020-08-25 DIAGNOSIS — F331 Major depressive disorder, recurrent, moderate: Secondary | ICD-10-CM | POA: Diagnosis not present

## 2020-08-25 NOTE — Progress Notes (Signed)
Virtual Visit via Video Note  I connected with Mark [name] on 08/25/20 at 2:00pm EST by video-enabled telemedicine with the correct person using two identifiers.   I discussed the limitations of evaluation and management by telemedicine and the availability of in person appointments. The patient expressed understanding and agreed to proceed.  LOCATION: Patient: home Provider: home office    History of Present Illness: Pt was referred to [facility] OP therapy for PTSD, depression and anxiety by the [facility].   Treatment Goal: Verbalize positive and hopeful feelings of the future.      Observations/Objective: Patient presented for today's session on time and was alert, oriented x5, with no evidence or self-report of SI/HI or A/V H.  Patient reported ongoing compliance with medication. Clinician inquired about patient's current emotional ratings, as well as any significant changes in thoughts, feelings or behavior since previous session.  Patient reported scores of 9/10 for depression, 9/10 for anxiety, 10/10 for anger/irritability. Patient reports his moods have been low within the last week. I'm still here with my family waiting until my wrist is healed, which is "making all my symptoms much worse." Cln used socratic questions. Cln and pt explored his stressors: family relationships, chronic pain, wrist not healing properly, not being home. Cln provided the purpose and benefit of mindfulness skills to assist pt with his stressors. Pt practiced mindfulness skills.   Assessment and Plan: Counselor will continue to meet with patient to address treatment plan goals. Patient will continue to follow recommendations of providers and implement skills learned in session.   Follow Up Instructions: I discussed the assessment and treatment plan with the patient. The patient was provided an opportunity to ask questions and all were answered. The patient agreed with the plan and demonstrated an understanding  of the instructions.   The patient was advised to call back or seek an in-person evaluation if the symptoms worsen or if the condition fails to improve as anticipated.  I provided 45 minutes of non-face-to-face time during this encounter.   Lennan Malone S, LCAS  .

## 2020-08-29 ENCOUNTER — Encounter (HOSPITAL_COMMUNITY): Payer: Self-pay | Admitting: Licensed Clinical Social Worker

## 2020-08-29 ENCOUNTER — Other Ambulatory Visit: Payer: Self-pay

## 2020-08-29 ENCOUNTER — Ambulatory Visit (INDEPENDENT_AMBULATORY_CARE_PROVIDER_SITE_OTHER): Payer: No Typology Code available for payment source | Admitting: Licensed Clinical Social Worker

## 2020-08-29 DIAGNOSIS — F419 Anxiety disorder, unspecified: Secondary | ICD-10-CM | POA: Diagnosis not present

## 2020-08-29 DIAGNOSIS — F4310 Post-traumatic stress disorder, unspecified: Secondary | ICD-10-CM

## 2020-08-29 DIAGNOSIS — F331 Major depressive disorder, recurrent, moderate: Secondary | ICD-10-CM

## 2020-08-29 NOTE — Progress Notes (Signed)
Virtual Visit via Video Note  I connected with Rasmus Hellums on 08/22/20 at 5:00 pm EST by a video enabled telemedicine application and verified that I am speaking with the correct person using two identifiers.   I discussed the limitations of evaluation and management by telemedicine and the availability of in person appointments. The patient expressed understanding and agreed to proceed.  LOCATION: Patient: Home Provider: home Office  History of Present Illness: [facility], depression, anxiety.   Observation/Objective:    Patient participated in a discussion on relationships (spousal, parent/child, intimate, partnership) where the group discussed effective communication, I-statements and fair fighting. Pt was encouraged to work on her current relationship using effective communication including I statements and fair fighting rules.     Assessment and Plan: Counselor will continue to meet with patient address treatment plan goals. Patient recommendations of providers and implement skill.    Follow-up instructions: I discussed the assessment and treatment plan with the patient. The patient was provided an opportunity to ask questions and all were answered. The patient agreed with the plan and demonstrated an understanding of the instructions.   The patient was advised to call back or seek an in-person evaluation if the symptoms worsen or if the condition fails to improve as anticipated.  I provided 75 minutes of non-face-to-face time during this encounter.   Jelina Paulsen S, LCAS    

## 2020-09-01 ENCOUNTER — Other Ambulatory Visit: Payer: Self-pay

## 2020-09-01 ENCOUNTER — Encounter (HOSPITAL_COMMUNITY): Payer: Self-pay | Admitting: Licensed Clinical Social Worker

## 2020-09-01 ENCOUNTER — Ambulatory Visit (INDEPENDENT_AMBULATORY_CARE_PROVIDER_SITE_OTHER): Payer: No Typology Code available for payment source | Admitting: Licensed Clinical Social Worker

## 2020-09-01 DIAGNOSIS — F419 Anxiety disorder, unspecified: Secondary | ICD-10-CM

## 2020-09-01 DIAGNOSIS — F4310 Post-traumatic stress disorder, unspecified: Secondary | ICD-10-CM

## 2020-09-01 DIAGNOSIS — F331 Major depressive disorder, recurrent, moderate: Secondary | ICD-10-CM | POA: Diagnosis not present

## 2020-09-01 NOTE — Progress Notes (Signed)
Virtual Visit via Video Note  I connected with Mark [name] on 09/01/20 at 2:00pm EST by video-enabled telemedicine with the correct person using two identifiers.   I discussed the limitations of evaluation and management by telemedicine and the availability of in person appointments. The patient expressed understanding and agreed to proceed.  LOCATION: Patient: home Provider: home office    History of Present Illness: Pt was referred to [facility] OP therapy for PTSD, depression and anxiety by the [facility].   Treatment Goal: Verbalize positive and hopeful feelings of the future.      Observations/Objective: Patient presented for today's session on time and was alert, oriented x5, with no evidence or self-report of SI/HI or A/V H.  Patient reported ongoing compliance with medication. Clinician inquired about patient's current emotional ratings, as well as any significant changes in thoughts, feelings or behavior since previous session.  Patient reported scores of 9/10 for depression, 9/10 for anxiety, 10/10 for anger/irritability. Patient reports his moods have been low within the last week. "I'm still here with my family waiting until my wrist is healed, which is making all my symptoms much worse." Cln and pt explored his stressors: family relationships, chronic pain, wrist not healing properly, not being in his own space. Pt's symptoms have increased. Clinician explored triggers and coping skills in order to assist in feeling better.     Assessment and Plan: Counselor will continue to meet with patient to address treatment plan goals. Patient will continue to follow recommendations of providers and implement skills learned in session.   Follow Up Instructions: I discussed the assessment and treatment plan with the patient. The patient was provided an opportunity to ask questions and all were answered. The patient agreed with the plan and demonstrated an understanding of the instructions.    The patient was advised to call back or seek an in-person evaluation if the symptoms worsen or if the condition fails to improve as anticipated.  I provided 45 minutes of non-face-to-face time during this encounter.   Tracina Beaumont S, LCAS  .

## 2020-09-05 ENCOUNTER — Other Ambulatory Visit: Payer: Self-pay

## 2020-09-05 ENCOUNTER — Encounter (HOSPITAL_COMMUNITY): Payer: Self-pay | Admitting: Licensed Clinical Social Worker

## 2020-09-05 ENCOUNTER — Ambulatory Visit (INDEPENDENT_AMBULATORY_CARE_PROVIDER_SITE_OTHER): Payer: No Typology Code available for payment source | Admitting: Licensed Clinical Social Worker

## 2020-09-05 DIAGNOSIS — F419 Anxiety disorder, unspecified: Secondary | ICD-10-CM

## 2020-09-05 DIAGNOSIS — F331 Major depressive disorder, recurrent, moderate: Secondary | ICD-10-CM | POA: Diagnosis not present

## 2020-09-05 DIAGNOSIS — F4310 Post-traumatic stress disorder, unspecified: Secondary | ICD-10-CM

## 2020-09-05 NOTE — Progress Notes (Signed)
Virtual Visit via Video Note  I connected with Mark [name] on 08/22/20 at 5:00 pm EST by a video enabled telemedicine application and verified that I am speaking with the correct person using two identifiers.   I discussed the limitations of evaluation and management by telemedicine and the availability of in person appointments. The patient expressed understanding and agreed to proceed.  LOCATION: Patient: Home Provider: home Office  History of Present Illness: [facility], depression, [facility].   Observation/Objective:    Patient participated in a discussion on using mindfulness-based stress response to assist patient in lowering the stress response. Patient identified current stressors. Patient was encouraged to use mindfulness skills as a coping skill.  Assessment and Plan: Counselor will continue to meet with patient address treatment plan goals. Patient recommendations of providers and implement skill.     Follow-up instructions: I discussed the assessment and treatment plan with the patient. The patient was provided an opportunity to ask questions and all were answered. The patient agreed with the plan and demonstrated an understanding of the instructions.   The patient was advised to call back or seek an in-person evaluation if the symptoms worsen or if the condition fails to improve as anticipated.  I provided 75 minutes of non-face-to-face time during this encounter.   Lyal Husted S, LCAS

## 2020-09-08 ENCOUNTER — Other Ambulatory Visit: Payer: Self-pay

## 2020-09-08 ENCOUNTER — Ambulatory Visit (INDEPENDENT_AMBULATORY_CARE_PROVIDER_SITE_OTHER): Payer: No Typology Code available for payment source | Admitting: Licensed Clinical Social Worker

## 2020-09-08 ENCOUNTER — Encounter (HOSPITAL_COMMUNITY): Payer: Self-pay | Admitting: Licensed Clinical Social Worker

## 2020-09-08 DIAGNOSIS — F331 Major depressive disorder, recurrent, moderate: Secondary | ICD-10-CM

## 2020-09-08 DIAGNOSIS — F4310 Post-traumatic stress disorder, unspecified: Secondary | ICD-10-CM | POA: Diagnosis not present

## 2020-09-08 DIAGNOSIS — F419 Anxiety disorder, unspecified: Secondary | ICD-10-CM | POA: Diagnosis not present

## 2020-09-08 NOTE — Progress Notes (Signed)
Virtual Visit via Video Note  I connected with Mark [name] on 09/08/20 at 2:00pm EST by video-enabled telemedicine with the correct person using two identifiers.   I discussed the limitations of evaluation and management by telemedicine and the availability of in person appointments. The patient expressed understanding and agreed to proceed.  LOCATION: Patient: home Provider: home office    History of Present Illness: Pt was referred to [facility] OP therapy for PTSD, depression and anxiety by the [facility].      Observations/Objective: Patient presented for today'[name] session on time and was alert, oriented x5, with no evidence or self-report of SI/HI or A/V H.  Patient reported ongoing compliance with medication. Clinician inquired about patient'[name] current emotional ratings, as well as any significant changes in thoughts, feelings or behavior since previous session.  Patient reported scores of 9/10 for depression, 9/10 for anxiety, 10/10 for anger/irritability. Cln and pt explored his ratings and coping skills. Pt continues to be over with his extended family, while his wrist heals. Cln and pt explored his current stressors: family relationships, chronic pain, wrist not healing properly, not being in his own space. Pt reports his anger/irritability has increased due to his stressors . Cln and patient explored anger, when repressed and turned inward, can turn into depression. Cln explained to pt how this can increase his feelings of sadness. Cln provided education on ways to deal with repressed anger.   Assessment and Plan: Counselor will continue to meet with patient to address treatment plan goals. Patient will continue to follow recommendations of providers and implement skills learned in session.   Follow Up Instructions: I discussed the assessment and treatment plan with the patient. The patient was provided an opportunity to ask questions and all were answered. The patient agreed with the plan  and demonstrated an understanding of the instructions.   The patient was advised to call back or seek an in-person evaluation if the symptoms worsen or if the condition fails to improve as anticipated.  I provided 60 minutes of non-face-to-face time during this encounter.   Mark [name], LCAS  .

## 2020-09-12 ENCOUNTER — Other Ambulatory Visit: Payer: Self-pay

## 2020-09-12 ENCOUNTER — Encounter (HOSPITAL_COMMUNITY): Payer: Self-pay | Admitting: Licensed Clinical Social Worker

## 2020-09-12 ENCOUNTER — Ambulatory Visit (INDEPENDENT_AMBULATORY_CARE_PROVIDER_SITE_OTHER): Payer: No Typology Code available for payment source | Admitting: Licensed Clinical Social Worker

## 2020-09-12 DIAGNOSIS — F419 Anxiety disorder, unspecified: Secondary | ICD-10-CM

## 2020-09-12 DIAGNOSIS — F331 Major depressive disorder, recurrent, moderate: Secondary | ICD-10-CM | POA: Diagnosis not present

## 2020-09-12 DIAGNOSIS — F4310 Post-traumatic stress disorder, unspecified: Secondary | ICD-10-CM | POA: Diagnosis not present

## 2020-09-12 NOTE — Progress Notes (Signed)
Virtual Visit via Video Note  I connected with Mark [name] on 09/12/20 at 5:00 pm EST by a video enabled telemedicine application and verified that I am speaking with the correct person using two identifiers.   I discussed the limitations of evaluation and management by telemedicine and the availability of in person appointments. The patient expressed understanding and agreed to proceed.  LOCATION: Patient: Home Provider: home Office  History of Present Illness: [facility], depression, anxiety.   Observation/Objective:    Patient participated in a discussion on sense of self-worth, valuing self, and having a sense of feeling worthy. Patient was encouraged to acknowledge the inner critic and practice positive affirmations.     Assessment and Plan: Counselor will continue to meet with patient address treatment plan goals. Patient recommendations of providers and implement skill.     Follow-up instructions: I discussed the assessment and treatment plan with the patient. The patient was provided an opportunity to ask questions and all were answered. The patient agreed with the plan and demonstrated an understanding of the instructions.   The patient was advised to call back or seek an in-person evaluation if the symptoms worsen or if the condition fails to improve as anticipated.  I provided 75 minutes of non-face-to-face time during this encounter.   Klyde Banka S, LCAS

## 2020-09-15 ENCOUNTER — Other Ambulatory Visit: Payer: Self-pay

## 2020-09-15 ENCOUNTER — Ambulatory Visit (INDEPENDENT_AMBULATORY_CARE_PROVIDER_SITE_OTHER): Payer: No Typology Code available for payment source | Admitting: Licensed Clinical Social Worker

## 2020-09-15 ENCOUNTER — Encounter (HOSPITAL_COMMUNITY): Payer: Self-pay | Admitting: Licensed Clinical Social Worker

## 2020-09-15 DIAGNOSIS — F331 Major depressive disorder, recurrent, moderate: Secondary | ICD-10-CM | POA: Diagnosis not present

## 2020-09-15 DIAGNOSIS — F419 Anxiety disorder, unspecified: Secondary | ICD-10-CM | POA: Diagnosis not present

## 2020-09-15 DIAGNOSIS — F4310 Post-traumatic stress disorder, unspecified: Secondary | ICD-10-CM | POA: Diagnosis not present

## 2020-09-15 NOTE — Progress Notes (Signed)
Virtual Visit via Video Note  I connected with Mark [name] on 09/15/20 at 2:00pm EST by video-enabled telemedicine with the correct person using two identifiers.   I discussed the limitations of evaluation and management by telemedicine and the availability of in person appointments. The patient expressed understanding and agreed to proceed.  LOCATION: Patient: extended family's house Provider: home office    History of Present Illness: Pt was referred to [name] OP therapy for PTSD, depression and anxiety by the [facility].      Observations/Objective: Patient presented for today's session on time and was alert, oriented x5, with no evidence or self-report of SI/HI or A/V H.  Patient reported ongoing compliance with medication. Clinician inquired about patient's current emotional ratings, as well as any significant changes in thoughts, feelings or behavior since previous session.  Patient reported scores of 7/10 for depression, 8/10 for anxiety, 10/10 for anger/irritability. Cln and pt explored his ratings and coping skills. Cln reviewed tx plan with pt who verbalized acceptance of the plan. Pt continues to be visiting his extended family, while his wrist heals. Cln and pt explored his current stressors: family relationships, chronic pain, wrist not healing properly, not being in his own space. Pt is going to go back to his house temporarily over the weekend. Pt reports his anger/irritability maintains high due to increase of his stressors. Cln utilized MI, OARS to assess how his stressors have impacted client's moods. Clinician allowed space for further processing of emotions and provided supportive statements throughout session. Cln provided education on how to use body scan meditation to assist with all his stressors, practiced in session.   Assessment and Plan: Counselor will continue to meet with patient to address treatment plan goals. Patient will continue to follow recommendations of  providers and implement skills learned in session.   Follow Up Instructions: I discussed the assessment and treatment plan with the patient. The patient was provided an opportunity to ask questions and all were answered. The patient agreed with the plan and demonstrated an understanding of the instructions.   The patient was advised to call back or seek an in-person evaluation if the symptoms worsen or if the condition fails to improve as anticipated.  I provided 45 minutes of non-face-to-face time during this encounter.   Konstantina Nachreiner S, LCAS  .

## 2020-09-19 ENCOUNTER — Other Ambulatory Visit: Payer: Self-pay

## 2020-09-19 ENCOUNTER — Encounter (HOSPITAL_COMMUNITY): Payer: Self-pay | Admitting: Licensed Clinical Social Worker

## 2020-09-19 ENCOUNTER — Ambulatory Visit (INDEPENDENT_AMBULATORY_CARE_PROVIDER_SITE_OTHER): Payer: No Typology Code available for payment source | Admitting: Licensed Clinical Social Worker

## 2020-09-19 DIAGNOSIS — F331 Major depressive disorder, recurrent, moderate: Secondary | ICD-10-CM

## 2020-09-19 NOTE — Progress Notes (Signed)
Virtual Visit via Video Note  I connected with Mark [name] on 09/12/20 at 5:00 pm EST by a video enabled telemedicine application and verified that I am speaking with the correct person using two identifiers.   I discussed the limitations of evaluation and management by telemedicine and the availability of in person appointments. The patient expressed understanding and agreed to proceed.  LOCATION: Patient: Home Provider: home Office  History of Present Illness: [facility], depression, [facility].   Observation/Objective:    Patient participated in a discussion on overcoming life's challenges, under stress, which makes it more difficult to overcome obstacles in life. Patient was encouraged to identify life challenges and explore strategies for overcoming obstacles.     Assessment and Plan: Counselor will continue to meet with patient address treatment plan goals. Patient recommendations of providers and implement skill.     Follow-up instructions: I discussed the assessment and treatment plan with the patient. The patient was provided an opportunity to ask questions and all were answered. The patient agreed with the plan and demonstrated an understanding of the instructions.   The patient was advised to call back or seek an in-person evaluation if the symptoms worsen or if the condition fails to improve as anticipated.  I provided 75 minutes of non-face-to-face time during this encounter.   Wanza Szumski S, LCAS

## 2020-09-22 ENCOUNTER — Other Ambulatory Visit: Payer: Self-pay

## 2020-09-22 ENCOUNTER — Ambulatory Visit (INDEPENDENT_AMBULATORY_CARE_PROVIDER_SITE_OTHER): Payer: No Typology Code available for payment source | Admitting: Licensed Clinical Social Worker

## 2020-09-22 ENCOUNTER — Encounter (HOSPITAL_COMMUNITY): Payer: Self-pay | Admitting: Licensed Clinical Social Worker

## 2020-09-22 DIAGNOSIS — F331 Major depressive disorder, recurrent, moderate: Secondary | ICD-10-CM

## 2020-09-22 DIAGNOSIS — F4310 Post-traumatic stress disorder, unspecified: Secondary | ICD-10-CM | POA: Diagnosis not present

## 2020-09-22 DIAGNOSIS — F419 Anxiety disorder, unspecified: Secondary | ICD-10-CM | POA: Diagnosis not present

## 2020-09-22 NOTE — Progress Notes (Signed)
Virtual Visit via Video Note  I connected with Mark [name] on 09/22/20 at 2:30pm EST by video-enabled telemedicine with the correct person using two identifiers.   I discussed the limitations of evaluation and management by telemedicine and the availability of in person appointments. The patient expressed understanding and agreed to proceed.  LOCATION: Patient: home Provider: home office    History of Present Illness: Pt was referred to [facility] OP therapy for PTSD, depression and anxiety by the [facility].      Observations/Objective: Patient presented for today's session on time and was alert, oriented x5, with no evidence or self-report of SI/HI or A/V H.  Patient reported ongoing compliance with medication. Clinician inquired about patient's current emotional ratings, as well as any significant changes in thoughts, feelings or behavior since previous session.  Patient reported scores of 7/10 for depression, 9/10 for anxiety, 5/10 for anger/irritability. Cln and pt explored his ratings and coping skills. Pt has returned home to continue to heal and care for himself. Cln and pt reviewed his ratings, "my irritability rating has gone down due to returning home, being away from my family." CLn and pt reviewed what coping skills he's using and what works/what doesn't. Cln assisted pt to help reduce irritability and increase normal social interactions. Cln explored with pt how to develop the ability to recognize, accept, and cope with feelings of depression.    Assessment and Plan: Counselor will continue to meet with patient to address treatment plan goals. Patient will continue to follow recommendations of providers and implement skills learned in session.   Follow Up Instructions I discussed the assessment and treatment plan with the patient. The patient was provided an opportunity to ask questions and all were answered. The patient agreed with the plan and demonstrated an understanding of the  instructions.   The patient was advised to call back or seek an in-person evaluation if the symptoms worsen or if the condition fails to improve as anticipated.  I provided 30 minutes of non-face-to-face time during this encounter.   Alayzia Pavlock S, LCAS  .

## 2020-09-29 ENCOUNTER — Ambulatory Visit (INDEPENDENT_AMBULATORY_CARE_PROVIDER_SITE_OTHER): Payer: No Typology Code available for payment source | Admitting: Licensed Clinical Social Worker

## 2020-09-29 ENCOUNTER — Encounter (HOSPITAL_COMMUNITY): Payer: Self-pay | Admitting: Licensed Clinical Social Worker

## 2020-09-29 ENCOUNTER — Other Ambulatory Visit: Payer: Self-pay

## 2020-09-29 DIAGNOSIS — F4310 Post-traumatic stress disorder, unspecified: Secondary | ICD-10-CM | POA: Diagnosis not present

## 2020-09-29 DIAGNOSIS — F331 Major depressive disorder, recurrent, moderate: Secondary | ICD-10-CM | POA: Diagnosis not present

## 2020-09-29 DIAGNOSIS — F419 Anxiety disorder, unspecified: Secondary | ICD-10-CM | POA: Diagnosis not present

## 2020-09-29 NOTE — Progress Notes (Signed)
Virtual Visit via Video Note  I connected with Mark [name] on 09/29/20 at 2:00pm EST by video-enabled telemedicine with the correct person using two identifiers.   I discussed the limitations of evaluation and management by telemedicine and the availability of in person appointments. The patient expressed understanding and agreed to proceed.  LOCATION: Patient: home Provider: home office    History of Present Illness: Pt was referred to [facility] OP therapy for PTSD, depression and anxiety by the [facility].      Observations/Objective: Patient presented for today's session on time and was alert, oriented x5, with no evidence or self-report of SI/HI or A/V H.  Patient reported ongoing compliance with medication. Clinician inquired about patient's current emotional ratings, as well as any significant changes in thoughts, feelings or behavior since previous session.  Patient reported scores of 6/10 for depression, 9/10 for anxiety, 5/10 for anger/irritability. Cln and pt explored his ratings and coping skills. Pt continues to heal and care for himself. Cln and pt reviewed his ratings, "my irritability rating has gone down due to returning home, being away from my extended family."  CLn utilized CBt to assist pt with understanding of the relationship between his repressed anger with his extended family and his depressed mood. Cln utilized mindfulness based coping skills to assist with his depressive symptoms  Assessment and Plan: Counselor will continue to meet with patient to address treatment plan goals. Patient will continue to follow recommendations of providers and implement skills learned in session.   Follow Up Instructions I discussed the assessment and treatment plan with the patient. The patient was provided an opportunity to ask questions and all were answered. The patient agreed with the plan and demonstrated an understanding of the instructions.   The patient was advised to call back or  seek an in-person evaluation if the symptoms worsen or if the condition fails to improve as anticipated.  I provided 60 minutes of non-face-to-face time during this encounter.   Damonique Brunelle S, LCAS  .

## 2020-10-03 ENCOUNTER — Encounter (HOSPITAL_COMMUNITY): Payer: Self-pay | Admitting: Licensed Clinical Social Worker

## 2020-10-03 ENCOUNTER — Ambulatory Visit (INDEPENDENT_AMBULATORY_CARE_PROVIDER_SITE_OTHER): Payer: No Typology Code available for payment source | Admitting: Licensed Clinical Social Worker

## 2020-10-03 ENCOUNTER — Other Ambulatory Visit: Payer: Self-pay

## 2020-10-03 DIAGNOSIS — F4310 Post-traumatic stress disorder, unspecified: Secondary | ICD-10-CM | POA: Diagnosis not present

## 2020-10-03 DIAGNOSIS — F419 Anxiety disorder, unspecified: Secondary | ICD-10-CM

## 2020-10-03 DIAGNOSIS — F331 Major depressive disorder, recurrent, moderate: Secondary | ICD-10-CM | POA: Diagnosis not present

## 2020-10-03 NOTE — Progress Notes (Signed)
Virtual Visit via Video Note  I connected with Mark [name] on 10/03/20 at 5:00 pm EST by a video enabled telemedicine application and verified that I am speaking with the correct person using two identifiers.   I discussed the limitations of evaluation and management by telemedicine and the availability of in person appointments. The patient expressed understanding and agreed to proceed.  LOCATION: Patient: Home Provider: home Office  History of Present Illness: [facility], depression, anxiety.   Observation/Objective:    Patient engaged in a discussion on identified positives and how they impact patient's moods. Clinician allowed space for further processing of emotions and provided supportive statements throughout the session. Patient was encouraged to identify positives in his life to improve mood.  Assessment and Plan: Counselor will continue to meet with patient address treatment plan goals. Patient recommendations of providers and implement skill.     Follow-up instructions: I discussed the assessment and treatment plan with the patient. The patient was provided an opportunity to ask questions and all were answered. The patient agreed with the plan and demonstrated an understanding of the instructions.   The patient was advised to call back or seek an in-person evaluation if the symptoms worsen or if the condition fails to improve as anticipated.  I provided 60 minutes of non-face-to-face time during this encounter.   Emillia Weatherly S, LCAS

## 2020-10-06 ENCOUNTER — Encounter (HOSPITAL_COMMUNITY): Payer: Self-pay | Admitting: Licensed Clinical Social Worker

## 2020-10-06 ENCOUNTER — Other Ambulatory Visit: Payer: Self-pay

## 2020-10-06 ENCOUNTER — Ambulatory Visit (INDEPENDENT_AMBULATORY_CARE_PROVIDER_SITE_OTHER): Payer: No Typology Code available for payment source | Admitting: Licensed Clinical Social Worker

## 2020-10-06 DIAGNOSIS — F419 Anxiety disorder, unspecified: Secondary | ICD-10-CM | POA: Diagnosis not present

## 2020-10-06 DIAGNOSIS — F331 Major depressive disorder, recurrent, moderate: Secondary | ICD-10-CM | POA: Diagnosis not present

## 2020-10-06 DIAGNOSIS — F4310 Post-traumatic stress disorder, unspecified: Secondary | ICD-10-CM

## 2020-10-06 NOTE — Progress Notes (Signed)
Virtual Visit via Video Note  I connected with Mark [name] on 10/06/20 at 2:00pm EST by video-enabled telemedicine with the correct person using two identifiers.   I discussed the limitations of evaluation and management by telemedicine and the availability of in person appointments. The patient expressed understanding and agreed to proceed.  LOCATION: Patient: home Provider: home office    History of Present Illness: Pt was referred to [facility] OP therapy for PTSD, depression and anxiety by the [facility].      Observations/Objective: Patient presented for today's session on time and was alert, oriented x5, with no evidence or self-report of SI/HI or A/V H.  Patient reported ongoing compliance with medication. Clinician inquired about patient's current emotional ratings, as well as any significant changes in thoughts, feelings or behavior since previous session.  Patient reported scores of 6/10 for depression, 9/10 for anxiety, 5/10 for anger/irritability. Cln and pt explored his ratings and coping skills. Pt continues to heal and care for himself.  He will start OT soon which will hopefully help with strength-building. Clinician utilized CBT to process thoughts, feelings, and behaviors. Clinician noted the challenge in being productive, working, and feeling positive when physical health is poor. Clinician provided supportive feedback.    Assessment and Plan: Counselor will continue to meet with patient to address treatment plan goals. Patient will continue to follow recommendations of providers and implement skills learned in session.   Follow Up Instructions I discussed the assessment and treatment plan with the patient. The patient was provided an opportunity to ask questions and all were answered. The patient agreed with the plan and demonstrated an understanding of the instructions.   The patient was advised to call back or seek an in-person evaluation if the symptoms worsen or if the  condition fails to improve as anticipated.  I provided 45 minutes of non-face-to-face time during this encounter.   Mark [name] S, LCAS  .

## 2020-10-10 ENCOUNTER — Other Ambulatory Visit: Payer: Self-pay

## 2020-10-10 ENCOUNTER — Ambulatory Visit (INDEPENDENT_AMBULATORY_CARE_PROVIDER_SITE_OTHER): Payer: No Typology Code available for payment source | Admitting: Licensed Clinical Social Worker

## 2020-10-10 ENCOUNTER — Encounter (HOSPITAL_COMMUNITY): Payer: Self-pay | Admitting: Licensed Clinical Social Worker

## 2020-10-10 DIAGNOSIS — F4310 Post-traumatic stress disorder, unspecified: Secondary | ICD-10-CM

## 2020-10-10 DIAGNOSIS — F331 Major depressive disorder, recurrent, moderate: Secondary | ICD-10-CM | POA: Diagnosis not present

## 2020-10-10 DIAGNOSIS — F419 Anxiety disorder, unspecified: Secondary | ICD-10-CM

## 2020-10-10 NOTE — Progress Notes (Signed)
Virtual Visit via Video Note  I connected with Mark [name] on 10/10/20 at 5:00 pm EST by a video enabled telemedicine application and verified that I am speaking with the correct person using two identifiers.   I discussed the limitations of evaluation and management by telemedicine and the availability of in person appointments. The patient expressed understanding and agreed to proceed.  LOCATION: Patient: Home Provider: home Office  History of Present Illness: [facility], depression, anxiety.   Observation/Objective:   Patient participated in a discussion on "Diversion Strategies" as a coping strategy during periods of distressed emotions. Cln provided education on diversion. Cln suggested patient use coping strategies to assist with periods of distressed emotions.   Assessment and Plan: Counselor will continue to meet with patient address treatment plan goals. Patient recommendations of providers and implement skill.     Follow-up instructions: I discussed the assessment and treatment plan with the patient. The patient was provided an opportunity to ask questions and all were answered. The patient agreed with the plan and demonstrated an understanding of the instructions.   The patient was advised to call back or seek an in-person evaluation if the symptoms worsen or if the condition fails to improve as anticipated.  I provided 60 minutes of non-face-to-face time during this encounter.   Analena Gama S, LCAS

## 2020-10-12 ENCOUNTER — Encounter (HOSPITAL_COMMUNITY): Payer: Self-pay | Admitting: *Deleted

## 2020-10-12 ENCOUNTER — Other Ambulatory Visit: Payer: Self-pay

## 2020-10-12 ENCOUNTER — Emergency Department (HOSPITAL_COMMUNITY)
Admission: EM | Admit: 2020-10-12 | Discharge: 2020-10-13 | Disposition: A | Discharge status: Home / Self Care | Payer: No Typology Code available for payment source | Source: Home / Self Care

## 2020-10-12 DIAGNOSIS — Z5321 Procedure and treatment not carried out due to patient leaving prior to being seen by health care provider: Secondary | ICD-10-CM | POA: Insufficient documentation

## 2020-10-12 DIAGNOSIS — A419 Sepsis, unspecified organism: Secondary | ICD-10-CM | POA: Diagnosis not present

## 2020-10-12 DIAGNOSIS — L0231 Cutaneous abscess of buttock: Secondary | ICD-10-CM | POA: Insufficient documentation

## 2020-10-12 LAB — CBC WITH DIFFERENTIAL/PLATELET
Abs Immature Granulocytes: 0.12 10*3/uL — ABNORMAL HIGH (ref 0.00–0.07)
Basophils Absolute: 0.1 10*3/uL (ref 0.0–0.1)
Basophils Relative: 0 %
Eosinophils Absolute: 0.2 10*3/uL (ref 0.0–0.5)
Eosinophils Relative: 1 %
HCT: 34.7 % — ABNORMAL LOW (ref 39.0–52.0)
Hemoglobin: 11.5 g/dL — ABNORMAL LOW (ref 13.0–17.0)
Immature Granulocytes: 1 %
Lymphocytes Relative: 10 %
Lymphs Abs: 1.9 10*3/uL (ref 0.7–4.0)
MCH: 28.4 pg (ref 26.0–34.0)
MCHC: 33.1 g/dL (ref 30.0–36.0)
MCV: 85.7 fL (ref 80.0–100.0)
Monocytes Absolute: 1.3 10*3/uL — ABNORMAL HIGH (ref 0.1–1.0)
Monocytes Relative: 7 %
Neutro Abs: 15.9 10*3/uL — ABNORMAL HIGH (ref 1.7–7.7)
Neutrophils Relative %: 81 %
Platelets: 214 10*3/uL (ref 150–400)
RBC: 4.05 MIL/uL — ABNORMAL LOW (ref 4.22–5.81)
RDW: 13.5 % (ref 11.5–15.5)
WBC: 19.4 10*3/uL — ABNORMAL HIGH (ref 4.0–10.5)
nRBC: 0 % (ref 0.0–0.2)

## 2020-10-12 LAB — COMPREHENSIVE METABOLIC PANEL
ALT: 15 U/L (ref 0–44)
AST: 14 U/L — ABNORMAL LOW (ref 15–41)
Albumin: 3.1 g/dL — ABNORMAL LOW (ref 3.5–5.0)
Alkaline Phosphatase: 108 U/L (ref 38–126)
Anion gap: 12 (ref 5–15)
BUN: 24 mg/dL — ABNORMAL HIGH (ref 6–20)
CO2: 22 mmol/L (ref 22–32)
Calcium: 9.3 mg/dL (ref 8.9–10.3)
Chloride: 95 mmol/L — ABNORMAL LOW (ref 98–111)
Creatinine, Ser: 1.35 mg/dL — ABNORMAL HIGH (ref 0.61–1.24)
GFR, Estimated: >60 mL/min (ref >60)
Glucose, Bld: 437 mg/dL — ABNORMAL HIGH (ref 70–99)
Potassium: 4.3 mmol/L (ref 3.5–5.1)
Sodium: 129 mmol/L — ABNORMAL LOW (ref 135–145)
Total Bilirubin: 0.6 mg/dL (ref 0.3–1.2)
Total Protein: 7.2 g/dL (ref 6.5–8.1)

## 2020-10-12 LAB — PROTIME-INR
INR: 1 (ref 0.8–1.2)
Prothrombin Time: 13.7 seconds (ref 11.4–15.2)

## 2020-10-12 LAB — APTT: aPTT: 25 seconds (ref 24–36)

## 2020-10-12 LAB — LACTIC ACID, PLASMA: Lactic Acid, Venous: 1.7 mmol/L (ref 0.5–1.9)

## 2020-10-12 MED ORDER — OXYCODONE-ACETAMINOPHEN 5-325 MG PO TABS
1.0000 | ORAL_TABLET | Freq: Once | ORAL | Status: DC
  Filled 2020-10-12: qty 1

## 2020-10-12 NOTE — ED Triage Notes (Signed)
PT c/o recurrent cysts to  buttocks area.

## 2020-10-12 NOTE — ED Provider Notes (Signed)
Emergency Medicine Provider Triage Evaluation Note  Mark [name] , a 60 y.o. male  was evaluated in triage.  Pt complains of pilonidal cyst and to left lumbar back.  Patient reports that he has had a history of pilonidal cyst in the past.  States that gently draining purulent material.  States that he developed swelling, wound, purulent discharge to left lumbar back.  This developed 2 days prior.  Endorses copious purulent material  Review of Systems  Positive: Wound, fever Negative: Cough, dysuria  Physical Exam  BP (!) 156/67 (BP Location: Left Arm)   Pulse (!) 114   Temp 100 F (37.8 C) (Oral)   Resp 17   SpO2 97%  Gen:   Awake, no distress   Resp:  Normal effort  MSK:   Moves extremities without difficulty  Other:  Wound left lumbar back, tender to touch, purulent discharge.  Medical Decision Making  Medically screening exam initiated at 7:11 PM.  Appropriate orders placed.  Mark [name] was informed that the remainder of the evaluation will be completed by another provider, this initial triage assessment does not replace that evaluation, and the importance of remaining in the ED until their evaluation is complete.  The patient appears stable so that the remainder of the work up may be completed by another provider.      Berneice Heinrich 10/12/20 Prentice Docker    Eber Hong, MD 10/16/20 [phone]

## 2020-10-13 ENCOUNTER — Ambulatory Visit (INDEPENDENT_AMBULATORY_CARE_PROVIDER_SITE_OTHER): Payer: No Typology Code available for payment source | Admitting: Licensed Clinical Social Worker

## 2020-10-13 DIAGNOSIS — F419 Anxiety disorder, unspecified: Secondary | ICD-10-CM

## 2020-10-13 DIAGNOSIS — F331 Major depressive disorder, recurrent, moderate: Secondary | ICD-10-CM

## 2020-10-13 DIAGNOSIS — F4310 Post-traumatic stress disorder, unspecified: Secondary | ICD-10-CM

## 2020-10-13 MED ORDER — OXYCODONE-ACETAMINOPHEN 5-325 MG PO TABS
1.0000 | ORAL_TABLET | Freq: Once | ORAL | Status: DC
Start: 2020-10-13 — End: 2020-10-13

## 2020-10-13 NOTE — ED Notes (Signed)
Patient called x3 for vitals with no response and not visible in lobby

## 2020-10-14 ENCOUNTER — Inpatient Hospital Stay (HOSPITAL_COMMUNITY)
Admission: EM | Admit: 2020-10-14 | Discharge: 2020-10-18 | DRG: 872 | Disposition: A | Discharge status: Home Health | Payer: No Typology Code available for payment source | Attending: Internal Medicine | Admitting: Internal Medicine

## 2020-10-14 ENCOUNTER — Other Ambulatory Visit: Payer: Self-pay

## 2020-10-14 ENCOUNTER — Encounter (HOSPITAL_COMMUNITY): Payer: Self-pay | Admitting: *Deleted

## 2020-10-14 ENCOUNTER — Emergency Department (HOSPITAL_COMMUNITY): Payer: No Typology Code available for payment source

## 2020-10-14 DIAGNOSIS — L0501 Pilonidal cyst with abscess: Secondary | ICD-10-CM | POA: Diagnosis not present

## 2020-10-14 DIAGNOSIS — N1831 Chronic kidney disease, stage 3a: Secondary | ICD-10-CM | POA: Diagnosis present

## 2020-10-14 DIAGNOSIS — F319 Bipolar disorder, unspecified: Secondary | ICD-10-CM | POA: Diagnosis present

## 2020-10-14 DIAGNOSIS — A419 Sepsis, unspecified organism: Secondary | ICD-10-CM | POA: Diagnosis not present

## 2020-10-14 DIAGNOSIS — L03317 Cellulitis of buttock: Secondary | ICD-10-CM | POA: Diagnosis present

## 2020-10-14 DIAGNOSIS — F331 Major depressive disorder, recurrent, moderate: Secondary | ICD-10-CM

## 2020-10-14 DIAGNOSIS — Z87891 Personal history of nicotine dependence: Secondary | ICD-10-CM

## 2020-10-14 DIAGNOSIS — E1165 Type 2 diabetes mellitus with hyperglycemia: Secondary | ICD-10-CM | POA: Diagnosis present

## 2020-10-14 DIAGNOSIS — Z79899 Other long term (current) drug therapy: Secondary | ICD-10-CM

## 2020-10-14 DIAGNOSIS — Z794 Long term (current) use of insulin: Secondary | ICD-10-CM

## 2020-10-14 DIAGNOSIS — R739 Hyperglycemia, unspecified: Secondary | ICD-10-CM

## 2020-10-14 DIAGNOSIS — Z806 Family history of leukemia: Secondary | ICD-10-CM

## 2020-10-14 DIAGNOSIS — E1122 Type 2 diabetes mellitus with diabetic chronic kidney disease: Secondary | ICD-10-CM | POA: Diagnosis present

## 2020-10-14 DIAGNOSIS — Z6834 Body mass index (BMI) 34.0-34.9, adult: Secondary | ICD-10-CM

## 2020-10-14 DIAGNOSIS — L0231 Cutaneous abscess of buttock: Secondary | ICD-10-CM | POA: Diagnosis present

## 2020-10-14 DIAGNOSIS — E1169 Type 2 diabetes mellitus with other specified complication: Secondary | ICD-10-CM | POA: Diagnosis present

## 2020-10-14 DIAGNOSIS — Z89611 Acquired absence of right leg above knee: Secondary | ICD-10-CM

## 2020-10-14 DIAGNOSIS — E669 Obesity, unspecified: Secondary | ICD-10-CM | POA: Diagnosis present

## 2020-10-14 DIAGNOSIS — E785 Hyperlipidemia, unspecified: Secondary | ICD-10-CM | POA: Diagnosis present

## 2020-10-14 DIAGNOSIS — F4310 Post-traumatic stress disorder, unspecified: Secondary | ICD-10-CM | POA: Diagnosis present

## 2020-10-14 DIAGNOSIS — I152 Hypertension secondary to endocrine disorders: Secondary | ICD-10-CM

## 2020-10-14 DIAGNOSIS — I129 Hypertensive chronic kidney disease with stage 1 through stage 4 chronic kidney disease, or unspecified chronic kidney disease: Secondary | ICD-10-CM | POA: Diagnosis present

## 2020-10-14 DIAGNOSIS — F411 Generalized anxiety disorder: Secondary | ICD-10-CM | POA: Diagnosis not present

## 2020-10-14 DIAGNOSIS — Z20822 Contact with and (suspected) exposure to covid-19: Secondary | ICD-10-CM | POA: Diagnosis present

## 2020-10-14 DIAGNOSIS — E1159 Type 2 diabetes mellitus with other circulatory complications: Secondary | ICD-10-CM

## 2020-10-14 LAB — COMPREHENSIVE METABOLIC PANEL
ALT: 15 U/L (ref 0–44)
AST: 12 U/L — ABNORMAL LOW (ref 15–41)
Albumin: 3.3 g/dL — ABNORMAL LOW (ref 3.5–5.0)
Alkaline Phosphatase: 117 U/L (ref 38–126)
Anion gap: 10 (ref 5–15)
BUN: 30 mg/dL — ABNORMAL HIGH (ref 6–20)
CO2: 23 mmol/L (ref 22–32)
Calcium: 9 mg/dL (ref 8.9–10.3)
Chloride: 99 mmol/L (ref 98–111)
Creatinine, Ser: 1.43 mg/dL — ABNORMAL HIGH (ref 0.61–1.24)
GFR, Estimated: 56 mL/min — ABNORMAL LOW (ref >60)
Glucose, Bld: 391 mg/dL — ABNORMAL HIGH (ref 70–99)
Potassium: 4.5 mmol/L (ref 3.5–5.1)
Sodium: 132 mmol/L — ABNORMAL LOW (ref 135–145)
Total Bilirubin: 0.6 mg/dL (ref 0.3–1.2)
Total Protein: 8.1 g/dL (ref 6.5–8.1)

## 2020-10-14 LAB — RESP PANEL BY RT-PCR (FLU A&B, COVID) ARPGX2
Influenza A by PCR: NEGATIVE
Influenza B by PCR: NEGATIVE
SARS Coronavirus 2 by RT PCR: NEGATIVE

## 2020-10-14 LAB — URINALYSIS, ROUTINE W REFLEX MICROSCOPIC
Bacteria, UA: NONE SEEN
Bilirubin Urine: NEGATIVE
Glucose, UA: >=500 mg/dL — AB
Hgb urine dipstick: NEGATIVE
Ketones, ur: 20 mg/dL — AB
Leukocytes,Ua: NEGATIVE
Nitrite: NEGATIVE
Protein, ur: 30 mg/dL — AB
Specific Gravity, Urine: 1.01 (ref 1.005–1.030)
pH: 6 (ref 5.0–8.0)

## 2020-10-14 LAB — CBC WITH DIFFERENTIAL/PLATELET
Abs Immature Granulocytes: 0.36 10*3/uL — ABNORMAL HIGH (ref 0.00–0.07)
Basophils Absolute: 0.1 10*3/uL (ref 0.0–0.1)
Basophils Relative: 0 %
Eosinophils Absolute: 0.1 10*3/uL (ref 0.0–0.5)
Eosinophils Relative: 1 %
HCT: 33 % — ABNORMAL LOW (ref 39.0–52.0)
Hemoglobin: 10.7 g/dL — ABNORMAL LOW (ref 13.0–17.0)
Immature Granulocytes: 2 %
Lymphocytes Relative: 9 %
Lymphs Abs: 1.9 10*3/uL (ref 0.7–4.0)
MCH: 28.1 pg (ref 26.0–34.0)
MCHC: 32.4 g/dL (ref 30.0–36.0)
MCV: 86.6 fL (ref 80.0–100.0)
Monocytes Absolute: 1.7 10*3/uL — ABNORMAL HIGH (ref 0.1–1.0)
Monocytes Relative: 9 %
Neutro Abs: 16.1 10*3/uL — ABNORMAL HIGH (ref 1.7–7.7)
Neutrophils Relative %: 79 %
Platelets: 200 10*3/uL (ref 150–400)
RBC: 3.81 MIL/uL — ABNORMAL LOW (ref 4.22–5.81)
RDW: 13.5 % (ref 11.5–15.5)
WBC: 20.2 10*3/uL — ABNORMAL HIGH (ref 4.0–10.5)
nRBC: 0 % (ref 0.0–0.2)

## 2020-10-14 LAB — PROTIME-INR
INR: 1.2 (ref 0.8–1.2)
Prothrombin Time: 15.6 seconds — ABNORMAL HIGH (ref 11.4–15.2)

## 2020-10-14 LAB — LACTIC ACID, PLASMA
Lactic Acid, Venous: 1.5 mmol/L (ref 0.5–1.9)
Lactic Acid, Venous: 1.3 mmol/L (ref 0.5–1.9)

## 2020-10-14 LAB — GLUCOSE, CAPILLARY: Glucose-Capillary: 341 mg/dL — ABNORMAL HIGH (ref 70–99)

## 2020-10-14 LAB — APTT: aPTT: 38 seconds — ABNORMAL HIGH (ref 24–36)

## 2020-10-14 MED ORDER — SODIUM CHLORIDE 0.9 % IV SOLN
2.0000 g | INTRAVENOUS | Status: DC
  Administered 2020-10-15 – 2020-10-18 (×4): 2 g via INTRAVENOUS
  Filled 2020-10-14 (×4): qty 20

## 2020-10-14 MED ORDER — BUPROPION HCL ER (XL) 300 MG PO TB24
300.0000 mg | ORAL_TABLET | Freq: Every day | ORAL | Status: DC
  Administered 2020-10-15 – 2020-10-18 (×4): 300 mg via ORAL
  Filled 2020-10-14 (×5): qty 1

## 2020-10-14 MED ORDER — CARVEDILOL 12.5 MG PO TABS
12.5000 mg | ORAL_TABLET | Freq: Two times a day (BID) | ORAL | Status: DC
  Administered 2020-10-15 – 2020-10-18 (×8): 12.5 mg via ORAL
  Filled 2020-10-14 (×8): qty 1

## 2020-10-14 MED ORDER — VANCOMYCIN HCL 1750 MG/350ML IV SOLN
1750.0000 mg | INTRAVENOUS | Status: DC
  Administered 2020-10-15 – 2020-10-16 (×2): 1750 mg via INTRAVENOUS
  Filled 2020-10-14 (×3): qty 350

## 2020-10-14 MED ORDER — SODIUM CHLORIDE 0.9 % IV SOLN
2.0000 g | Freq: Once | INTRAVENOUS | Status: AC
  Administered 2020-10-14: 2 g via INTRAVENOUS
  Filled 2020-10-14: qty 20

## 2020-10-14 MED ORDER — VANCOMYCIN HCL 2000 MG/400ML IV SOLN
2000.0000 mg | Freq: Once | INTRAVENOUS | Status: AC
  Administered 2020-10-14: 2000 mg via INTRAVENOUS
  Filled 2020-10-14: qty 400

## 2020-10-14 MED ORDER — ATORVASTATIN CALCIUM 40 MG PO TABS
80.0000 mg | ORAL_TABLET | Freq: Every day | ORAL | Status: DC
  Administered 2020-10-15 – 2020-10-18 (×4): 80 mg via ORAL
  Filled 2020-10-14 (×4): qty 2

## 2020-10-14 MED ORDER — BUSPIRONE HCL 5 MG PO TABS
15.0000 mg | ORAL_TABLET | Freq: Three times a day (TID) | ORAL | Status: DC
  Administered 2020-10-15 – 2020-10-18 (×11): 15 mg via ORAL
  Filled 2020-10-14 (×12): qty 1

## 2020-10-14 MED ORDER — ONDANSETRON HCL 4 MG/2ML IJ SOLN
4.0000 mg | Freq: Once | INTRAMUSCULAR | Status: AC
  Administered 2020-10-14: 4 mg via INTRAVENOUS
  Filled 2020-10-14: qty 2

## 2020-10-14 MED ORDER — LACTATED RINGERS IV SOLN: INTRAVENOUS | Status: DC

## 2020-10-14 MED ORDER — VANCOMYCIN HCL IN DEXTROSE 1-5 GM/200ML-% IV SOLN: 1000.0000 mg | Freq: Once | INTRAVENOUS | Status: DC

## 2020-10-14 MED ORDER — LACTATED RINGERS IV BOLUS (SEPSIS)
2000.0000 mL | Freq: Once | INTRAVENOUS | Status: AC
  Administered 2020-10-14: 2000 mL via INTRAVENOUS

## 2020-10-14 MED ORDER — TRAZODONE HCL 50 MG PO TABS: 50.0000 mg | ORAL_TABLET | Freq: Every evening | ORAL | Status: DC | PRN

## 2020-10-14 MED ORDER — HYDROMORPHONE HCL 1 MG/ML IJ SOLN
0.5000 mg | INTRAMUSCULAR | Status: DC | PRN
  Administered 2020-10-14 – 2020-10-18 (×15): 0.5 mg via INTRAVENOUS
  Filled 2020-10-14 (×15): qty 0.5

## 2020-10-14 MED ORDER — INSULIN ASPART 100 UNIT/ML IJ SOLN: 0.0000 [IU] | Freq: Three times a day (TID) | INTRAMUSCULAR | Status: DC

## 2020-10-14 MED ORDER — HYDROXYZINE PAMOATE 25 MG PO CAPS: 25.0000 mg | ORAL_CAPSULE | Freq: Two times a day (BID) | ORAL | Status: DC | PRN

## 2020-10-14 MED ORDER — HYDROMORPHONE HCL 1 MG/ML IJ SOLN
1.0000 mg | Freq: Once | INTRAMUSCULAR | Status: AC
  Administered 2020-10-14: 1 mg via INTRAVENOUS
  Filled 2020-10-14: qty 1

## 2020-10-14 MED ORDER — HYDROXYZINE HCL 25 MG PO TABS
25.0000 mg | ORAL_TABLET | Freq: Two times a day (BID) | ORAL | Status: DC | PRN
  Administered 2020-10-17: 25 mg via ORAL
  Filled 2020-10-14: qty 1

## 2020-10-14 MED ORDER — MORPHINE SULFATE (PF) 2 MG/ML IV SOLN
2.0000 mg | INTRAVENOUS | Status: DC | PRN
  Administered 2020-10-15 – 2020-10-17 (×5): 2 mg via INTRAVENOUS
  Filled 2020-10-14 (×5): qty 1

## 2020-10-14 MED ORDER — INSULIN GLARGINE-YFGN 100 UNIT/ML ~~LOC~~ SOLN
25.0000 [IU] | Freq: Two times a day (BID) | SUBCUTANEOUS | Status: DC
  Administered 2020-10-14: 25 [IU] via SUBCUTANEOUS
  Filled 2020-10-14 (×2): qty 0.25

## 2020-10-14 MED ORDER — ESCITALOPRAM OXALATE 20 MG PO TABS
20.0000 mg | ORAL_TABLET | Freq: Every day | ORAL | Status: DC
  Administered 2020-10-15 – 2020-10-18 (×4): 20 mg via ORAL
  Filled 2020-10-14 (×4): qty 1

## 2020-10-14 MED ORDER — LISINOPRIL 20 MG PO TABS
20.0000 mg | ORAL_TABLET | Freq: Every day | ORAL | Status: DC
  Administered 2020-10-15 – 2020-10-18 (×4): 20 mg via ORAL
  Filled 2020-10-14 (×4): qty 1

## 2020-10-14 MED ORDER — PRAZOSIN HCL 1 MG PO CAPS
2.0000 mg | ORAL_CAPSULE | Freq: Every day | ORAL | Status: DC
  Administered 2020-10-15 – 2020-10-18 (×4): 2 mg via ORAL
  Filled 2020-10-14 (×5): qty 2

## 2020-10-14 MED ORDER — HYDROMORPHONE HCL 1 MG/ML IJ SOLN
1.0000 mg | Freq: Once | INTRAMUSCULAR | Status: AC
Start: 2020-10-14 — End: 2020-10-14
  Administered 2020-10-14: 1 mg via INTRAVENOUS
  Filled 2020-10-14: qty 1

## 2020-10-14 MED ORDER — HYDROMORPHONE HCL 1 MG/ML IJ SOLN
0.5000 mg | Freq: Once | INTRAMUSCULAR | Status: AC
  Administered 2020-10-14: 0.5 mg via INTRAVENOUS
  Filled 2020-10-14: qty 0.5

## 2020-10-14 MED ORDER — ARIPIPRAZOLE 10 MG PO TABS
10.0000 mg | ORAL_TABLET | Freq: Every day | ORAL | Status: DC
  Administered 2020-10-15 – 2020-10-18 (×4): 10 mg via ORAL
  Filled 2020-10-14 (×4): qty 1

## 2020-10-14 MED ORDER — IOHEXOL 350 MG/ML SOLN
80.0000 mL | Freq: Once | INTRAVENOUS | Status: AC | PRN
  Administered 2020-10-14: 80 mL via INTRAVENOUS

## 2020-10-14 NOTE — Progress Notes (Signed)
Pharmacy Antibiotic Note  Mark [name] is a 60 y.o. male admitted on 10/14/2020 with worsening buttock abscess.  Pharmacy has been consulted for vancomycin dosing.  Plan: Vancomycin 2000 mg loading dose given followed by 1750 mg IV Q 24 hrs. Goal AUC 400-550. Expected AUC: 498 SCr used: 1.43 Ceftriaxone 2 g daily per MD Will f/u renal function, culture results, and clinical course Levels if/when indicated     Temp (24hrs), Avg:98.5 F (36.9 C), Min:98.5 F (36.9 C), Max:98.5 F (36.9 C)  Recent Labs  Lab 10/12/20 1928 10/14/20 1447 10/14/20 1520 10/14/20 1650  WBC 19.4* 20.2*  --   --   CREATININE 1.35* 1.43*  --   --   LATICACIDVEN 1.7  --  1.5 1.3    Estimated Creatinine Clearance: 76.4 mL/min (A) (by C-G formula based on SCr of 1.43 mg/dL (H)).    Allergies  Allergen Reactions   Ozempic (0.25 Or 0.5 Mg-Dose) [Semaglutide(0.25 Or 0.5mg -Dos)] Other (See Comments)    Caused Pancreatitis   Semaglutide Other (See Comments)    Caused pancreatitis   Lyrica [Pregabalin] Rash    Antimicrobials this admission: 9/23 vancomycin >>  9/23 CTX >>   Dose adjustments this admission:  Microbiology results: 9/23 BCx:   Thank you for allowing pharmacy to be a part of this patient's care.  Valentina Gu 10/14/2020 7:31 PM

## 2020-10-14 NOTE — ED Notes (Signed)
Attempted x1 blood draw, unsuccessful.

## 2020-10-14 NOTE — Consult Note (Signed)
Reason for Consult:buttock pain Referring Physician: Dr. Lannette Donath Mark [name] is an 60 y.o. male.  HPI: The patient is a 60 year old black male who presents with 1 week of left buttock pain.  He denies any fevers or chills.  He describes the pain as severe.  He has noted some drainage from the area.  He apparently had similar symptoms about a year ago and was admitted for incision and drainage in the operating room.  He underwent a CT scan that shows some inflammatory change of the soft tissue of the left buttock area but it is difficult to see if there is any drainable fluid collection.  Past Medical History:  Diagnosis Date   Above knee amputation status, right    Anxiety    Depression    Diabetes mellitus, type II (HCC)    Hyperlipemia    Hypertension    PTSD (post-traumatic stress disorder)    Shoulder pain     Past Surgical History:  Procedure Laterality Date   HERNIA REPAIR     INCISION AND DRAINAGE Left 12/11/2019   Procedure: INCISION AND DRAINAGE POSTERIOR LEFT THIGH ABSCESS;  Surgeon: Axel Filler, MD;  Location: [facility] OR;  Service: General;  Laterality: Left;   INCISION AND DRAINAGE ABSCESS Left 12/11/2019   Procedure: INCISION AND DRAINAGE BACK ABSCESS,;  Surgeon: Axel Filler, MD;  Location: [facility] OR;  Service: General;  Laterality: Left;   LEG AMPUTATION Right    parenatal cyst      Family History  Problem Relation Age of Onset   Aneurysm Mother    Leukemia Father    Dementia Maternal Aunt    Seizures Paternal Uncle     Social History:  reports that he quit smoking about 13 months ago. His smoking use included cigarettes. He has never used smokeless tobacco. He reports current alcohol use of about 4.0 standard drinks per week. He reports current drug use. Frequency: 14.00 times per week. Drug: Marijuana.  Allergies:  Allergies  Allergen Reactions   Ozempic (0.25 Or 0.5 Mg-Dose) [Semaglutide(0.25 Or 0.5mg -Dos)] Other (See Comments)    Caused Pancreatitis    Semaglutide Other (See Comments)    Caused pancreatitis   Lyrica [Pregabalin] Rash    Medications: I have reviewed the patient's current medications.  Results for orders placed or performed during the hospital encounter of 10/14/20 (from the past 48 hour(s))  Urinalysis, Routine w reflex microscopic Urine, Clean Catch     Status: Abnormal   Collection Time: 10/14/20  2:36 PM  Result Value Ref Range   Color, Urine YELLOW YELLOW   APPearance CLEAR CLEAR   Specific Gravity, Urine 1.010 1.005 - 1.030   pH 6.0 5.0 - 8.0   Glucose, UA >=500 (A) NEGATIVE mg/dL   Hgb urine dipstick NEGATIVE NEGATIVE   Bilirubin Urine NEGATIVE NEGATIVE   Ketones, ur 20 (A) NEGATIVE mg/dL   Protein, ur 30 (A) NEGATIVE mg/dL   Nitrite NEGATIVE NEGATIVE   Leukocytes,Ua NEGATIVE NEGATIVE   RBC / HPF 0-5 0 - 5 RBC/hpf   WBC, UA 0-5 0 - 5 WBC/hpf   Bacteria, UA NONE SEEN NONE SEEN    Comment: Performed at [facility], [address] [place], [place] [address]  Resp Panel by RT-PCR (Flu A&B, Covid) Nasopharyngeal Swab     Status: None   Collection Time: 10/14/20  2:45 PM   Specimen: Nasopharyngeal Swab; Nasopharyngeal(NP) swabs in vial transport medium  Result Value Ref Range   SARS Coronavirus 2 by RT  PCR NEGATIVE NEGATIVE    Comment: (NOTE) SARS-CoV-2 target nucleic acids are NOT DETECTED.  The SARS-CoV-2 RNA is generally detectable in upper respiratory specimens during the acute phase of infection. The lowest concentration of SARS-CoV-2 viral copies this assay can detect is 138 copies/mL. A negative result does not preclude SARS-Cov-2 infection and should not be used as the sole basis for treatment or other patient management decisions. A negative result may occur with  improper specimen collection/handling, submission of specimen other than nasopharyngeal swab, presence of viral mutation(s) within the areas targeted by this assay, and inadequate number of viral copies(<138  copies/mL). A negative result must be combined with clinical observations, patient history, and epidemiological information. The expected result is Negative.  Fact Sheet for Patients:  BloggerCourse.com  Fact Sheet for Healthcare Providers:  SeriousBroker.it  This test is no t yet approved or cleared by the [place] FDA and  has been authorized for detection and/or diagnosis of SARS-CoV-2 by FDA under an Emergency Use Authorization (EUA). This EUA will remain  in effect (meaning this test can be used) for the duration of the COVID-19 declaration under Section 564(b)(1) of the Act, 21 U.S.C.section 360bbb-3(b)(1), unless the authorization is terminated  or revoked sooner.       Influenza A by PCR NEGATIVE NEGATIVE   Influenza B by PCR NEGATIVE NEGATIVE    Comment: (NOTE) The Xpert Xpress SARS-CoV-2/FLU/RSV plus assay is intended as an aid in the diagnosis of influenza from Nasopharyngeal swab specimens and should not be used as a sole basis for treatment. Nasal washings and aspirates are unacceptable for Xpert Xpress SARS-CoV-2/FLU/RSV testing.  Fact Sheet for Patients: BloggerCourse.com  Fact Sheet for Healthcare Providers: SeriousBroker.it  This test is not yet approved or cleared by the [place] FDA and has been authorized for detection and/or diagnosis of SARS-CoV-2 by FDA under an Emergency Use Authorization (EUA). This EUA will remain in effect (meaning this test can be used) for the duration of the COVID-19 declaration under Section 564(b)(1) of the Act, 21 U.S.C. section 360bbb-3(b)(1), unless the authorization is terminated or revoked.  Performed at [facility], [address] [place], [place] [address]   Comprehensive metabolic panel     Status: Abnormal   Collection Time: 10/14/20  2:47 PM  Result Value Ref Range   Sodium 132 (L)  135 - 145 mmol/L   Potassium 4.5 3.5 - 5.1 mmol/L   Chloride 99 98 - 111 mmol/L   CO2 23 22 - 32 mmol/L   Glucose, Bld 391 (H) 70 - 99 mg/dL    Comment: Glucose reference range applies only to samples taken after fasting for at least 8 hours.   BUN 30 (H) 6 - 20 mg/dL   Creatinine, Ser 5.00 (H) 0.61 - 1.24 mg/dL   Calcium 9.0 8.9 - 93.8 mg/dL   Total Protein 8.1 6.5 - 8.1 g/dL   Albumin 3.3 (L) 3.5 - 5.0 g/dL   AST 12 (L) 15 - 41 U/L   ALT 15 0 - 44 U/L   Alkaline Phosphatase 117 38 - 126 U/L   Total Bilirubin 0.6 0.3 - 1.2 mg/dL   GFR, Estimated 56 (L) >60 mL/min    Comment: (NOTE) Calculated using the CKD-EPI Creatinine Equation (2021)    Anion gap 10 5 - 15    Comment: Performed at [facility], [address], [place], [place] [address]  CBC WITH DIFFERENTIAL     Status: Abnormal   Collection  Time: 10/14/20  2:47 PM  Result Value Ref Range   WBC 20.2 (H) 4.0 - 10.5 K/uL   RBC 3.81 (L) 4.22 - 5.81 MIL/uL   Hemoglobin 10.7 (L) 13.0 - 17.0 g/dL   HCT 33.0 (L) 39.0 - 52.0 %   MCV 86.6 80.0 - 100.0[id number] [facility] [facility]<MEASURE[place]Ouchter0 - 34.0 pg   MCHC 32.4 30.0 - 36.0 g/dL   RDW [facility] >3 mm site of ground-glass nodularity in the subpleural aspect of the right lower lobe, (series 6, image 3). The included lung bases are otherwise clear. Heart size is normal. Hepatobiliary: Mildly decreased attenuation of the hepatic parenchyma  suggesting hepatic  steatosis. No focal liver lesion identified. Tiny stone within the gallbladder. No evidence of gallbladder wall thickening or pericholecystic inflammatory changes by CT. No biliary dilatation. Pancreas: Unremarkable. No pancreatic ductal dilatation or surrounding inflammatory changes. Spleen: Normal in size without focal abnormality. Adrenals/Urinary Tract: Unremarkable adrenal glands. Kidneys enhance symmetrically without focal lesion, stone, or hydronephrosis. Ureters are nondilated. Urinary bladder appears unremarkable. Stomach/Bowel: Stomach is within normal limits. Appendix appears normal. No evidence of bowel wall thickening, distention, or inflammatory changes. Vascular/Lymphatic: Scattered aortoiliac atherosclerotic calcifications without aneurysm. Enlarged bilateral inguinal and external iliac chain lymph nodes with the largest node in the left external iliac chain measuring 2.0 cm short axis (series 2, image 86). No abdominal lymphadenopathy. Reproductive: Prostate is unremarkable. Other: No free fluid. No abdominopelvic fluid collection. No pneumoperitoneum. Tiny fat containing paraumbilical hernia. Musculoskeletal: There is prominent skin thickening of the posterior back and gluteal region. Small probable thick-walled collection in the region of the inferior gluteal cleft measuring up to 1.9 cm (series 2, image 84). This area closely approximates the sacrococcygeal junction. No definite bony destruction is identified to suggest osteomyelitis. A more ill-defined area of increased density in the soft tissues along the superior aspect of the gluteal cleft appears similar in appearance to the prior CT from 2021. Additionally there are sites of increased density and stranding within the subcutaneous soft tissues of the upper left gluteal region (series 2, image 63), left flank (series 2, image 42) and lower right back (series 2, image 50), all of which were present on the prior CT lumbar spine from  12/06/2019. No soft tissue gas. Subtly sclerotic appearance of the T11 vertebral body without fracture or well-defined bone lesion. Findings could represent sequela of prior trauma, or possibly a bone lesion such as a intraosseous hemangioma. IMPRESSION: 1. Small probable thick-walled collection in the region of the inferior gluteal cleft measuring up to 1.9 cm may reflect a small phlegmon or abscess. This area closely approximates the sacrococcygeal junction. No definite bony destruction is identified to suggest osteomyelitis. 2. Enlarged bilateral inguinal and external iliac chain lymph nodes with the largest node in the left external iliac chain measuring up to 2.0 cm short axis. Findings are likely reactive. 3. Multiple sites of increased density and stranding within the subcutaneous soft tissues of the upper left gluteal region, left flank, and lower right back. These findings were all present on the prior CT from November of 2021 and may reflect sequela of prior trauma or infection. 4. Cholelithiasis without evidence of acute cholecystitis. 5. Subtly sclerotic appearance of the T11 vertebral body without fracture or well-defined bone lesion. This is unchanged from the prior CT and felt to most likely represent sequela of prior trauma, or possibly a bone lesion such as a intraosseous hemangioma. 6. 3 mm site of ground-glass nodularity in the subpleural aspect of the right lower lobe, likely infectious or inflammatory in etiology. 7. Hepatic steatosis. Aortic Atherosclerosis (ICD10-I70.0). Electronically Signed   By: Duanne Guess D.O.   On: 10/14/2020 18:18    Review of Systems  Constitutional: Negative.   HENT: Negative.    Eyes: Negative.   Respiratory: Negative.    Cardiovascular: Negative.   Gastrointestinal: Negative.   Endocrine: Negative.   Genitourinary: Negative.   Musculoskeletal:  Positive for back pain.  Skin: Negative.   Allergic/Immunologic: Negative.   Neurological: Negative.    Hematological: Negative.   Psychiatric/Behavioral: Negative.    Blood pressure (!) 156/67, pulse (!) 123, temperature 98.4 F (  36.9 C), temperature source Oral, resp. rate 20, SpO2 92 %. Physical Exam Constitutional:      General: He is not in acute distress.    Appearance: Normal appearance. He is obese.  HENT:     Head: Normocephalic and atraumatic.     Right Ear: External ear normal.     Left Ear: External ear normal.     Nose: Nose normal.     Mouth/Throat:     Mouth: Mucous membranes are moist.     Pharynx: Oropharynx is clear.  Eyes:     Extraocular Movements: Extraocular movements intact.     Conjunctiva/sclera: Conjunctivae normal.     Pupils: Pupils are equal, round, and reactive to light.  Cardiovascular:     Rate and Rhythm: Normal rate and regular rhythm.     Pulses: Normal pulses.     Heart sounds: Normal heart sounds.  Pulmonary:     Effort: Pulmonary effort is normal. No respiratory distress.     Breath sounds: Normal breath sounds.  Abdominal:     General: Abdomen is flat. Bowel sounds are normal.     Palpations: Abdomen is soft.     Tenderness: There is no abdominal tenderness.  Musculoskeletal:        General: No swelling. Normal range of motion.     Cervical back: Normal range of motion and neck supple.  Skin:    General: Skin is warm and dry.     Comments: There is some induration in the left upper buttock area but no cellulitis.  No drainage at this time but he does report drainage from the area.  Neurological:     General: No focal deficit present.     Mental Status: He is alert and oriented to person, place, and time.  Psychiatric:        Mood and Affect: Mood normal.        Behavior: Behavior normal.    Assessment/Plan: The patient appears to have some induration and soft tissue infection of the left buttock area.  At this point I would agree with admission to the hospital and broad-spectrum antibiotic therapy.  We will review his scans again in  the morning.  If we can identify a drainable fluid collection he may need to go to the operating room for that.  Otherwise we will follow him closely with you.  Chevis Pretty III 10/14/2020, 11:54 PM

## 2020-10-14 NOTE — ED Triage Notes (Signed)
Pt complains of infected pilonidal cyst x 1 week. Reports being admitted for same in the past.

## 2020-10-14 NOTE — ED Notes (Signed)
Pt provided with a [place] sandwich, a slice of cheese, and a cup of water.

## 2020-10-14 NOTE — Progress Notes (Signed)
A consult was received from an ED physician for vancomycin per pharmacy dosing.  The patient's profile has been reviewed for ht/wt/allergies/indication/available labs.   A one time order has been placed for vancomycin 2g IV x1.  Further antibiotics/pharmacy consults should be ordered by admitting physician if indicated.                       Thank you,  Rexford Maus, PharmD 10/14/2020 2:12 PM

## 2020-10-14 NOTE — ED Notes (Signed)
ED Provider at bedside. 

## 2020-10-14 NOTE — ED Provider Notes (Signed)
Patient handed off to me at 4:30 PM.  Sepsis work-up initiated.  Broad-spectrum IV antibiotics started.  Patient with history of diabetes.  Has had multiple abscess/cellulitis of buttocks area and low back in the past.  Has area of redness and induration in the left gluteal area with surrounding tissue feeling firm and suspect likely significant cellulitis and likely abscess.  He has had pilonidal cyst surgery in the past but appears to have some drainage in this area as well.  There is no obvious crepitus.  Patient tachycardic upon arrival.  Has had low-grade fever at home.  Has had above-knee amputation on the right.  Concern for possible DKA as well.  However at this time blood work shows blood sugar of 391 but normal anion gap and normal bicarb..  Creatinine 1.43.  Does have a leukocytosis of 20.  COVID test is negative.  Urinalysis negative for infection.  Awaiting CT scan abdomen pelvis.  Will need admission and likely surgical consultation.  CT scan shows overall small phlegmon/abscess but surrounding cellulitis.  Does have some reactive lymph nodes as well in these areas.  He has multiple sites of increased density and stranding within the subcu soft tissues of the upper left gluteal region in the left lower flank and left lower back.  Seem consistent with last CT scan but suspect it could be a kidney infection as well.  Will admit to medicine for further care.  This chart was dictated using voice recognition software.  Despite best efforts to proofread,  errors can occur which can change the documentation meaning.    Virgina Norfolk, DO 10/14/20 1859

## 2020-10-14 NOTE — H&P (Addendum)
History and Physical    Mark [name] DJS:[id number] DOB: 07-04-1960 DOA: 10/14/2020  PCP: [name]  Patient coming from: Home  I have personally briefly reviewed patient's old medical records in [facility]  Chief Complaint: Buttocks abscess  HPI: Mark [name] is a 61 y.o. 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  Patient noticed an abscess to his gluteal cleft and right buttocks starting about 5 6 days ago.  He initially presented to [facility] but left without being seen after 8-1/2-hour wait since he could no longer tolerate sitting on his buttocks.  Reports having a fever of 101 that time.  States abscess has grown larger and has bloody drainage.  Has increased pain.  Has been taking 2 10mg  Percocets at the end of the day with minimal relief. Patient was last hospitalized in 11/2019 with multiple thigh and back abscesses requiring I&D.  Had MRSA grown in cultures.  ED Course: He was afebrile, but tachycardic up to 120s, tachypnea up to RR of 24 and blood pressure of 148/99.  WBC of 20.2, hemoglobin 10.7, BG of 391 with no anion gap.  CT of the abdomen showed small collection in the inferior gluteal reflective of a small phlegmon or abscess.  There is also lymphadenopathy in the inguinal and external iliac nodes that are likely reactive.  He also has multiple area of increased density and stranding within the subcutaneous soft tissue of the upper left gluteal region, left flank and right lower back but these are unchanged from CT in November.  Review of Systems: Constitutional: No Weight Change, No Fever ENT/Mouth: No sore throat, No Rhinorrhea Eyes: No Eye Pain, No Vision Changes Cardiovascular: No Chest  Pain, no SOB Respiratory: No Cough Gastrointestinal: No Nausea, No Vomiting, No Diarrhea, No Constipation, No Pain Genitourinary: no Urinary Incontinence Musculoskeletal: No Arthralgias, No Myalgias Skin: No Skin Lesions, No Pruritus, Neuro: no Weakness, No Numbness,   Psych: No Anxiety/Panic, No Depression, no decrease appetite Heme/Lymph: No Bruising, No Bleeding  Past Medical History:  Diagnosis Date   Above knee amputation status, right    Anxiety    Depression    Diabetes mellitus, type II (HCC)    Hyperlipemia    Hypertension    PTSD (post-traumatic stress disorder)    Shoulder pain     Past Surgical History:  Procedure Laterality Date   HERNIA REPAIR     INCISION AND DRAINAGE Left 12/11/2019   Procedure: INCISION AND DRAINAGE POSTERIOR LEFT THIGH ABSCESS;  Surgeon: 12/13/2019, MD;  Location: [facility] OR;  Service: General;  Laterality: Left;   INCISION AND DRAINAGE ABSCESS Left 12/11/2019   Procedure: INCISION AND DRAINAGE BACK ABSCESS,;  Surgeon: 12/13/2019, MD;  Location: [facility] OR;  Service: General;  Laterality: Left;   LEG AMPUTATION Right    parenatal cyst       reports that he quit smoking about 13 months ago. His smoking use included cigarettes. He has never used smokeless tobacco. He reports current alcohol use of about 4.0 standard drinks per week. He reports current drug use. Frequency: 14.00 times per week. Drug: Marijuana. Social History  Allergies  Allergen Reactions   Ozempic (0.25 Or 0.5 Mg-Dose) [Semaglutide(0.25 Or 0.5mg -Dos)] Other (See Comments)    Caused Pancreatitis   Semaglutide Other (See Comments)  Caused pancreatitis   Lyrica [Pregabalin] Rash    Family History  Problem Relation Age of Onset   Aneurysm Mother    Leukemia Father    Dementia Maternal Aunt    Seizures Paternal Uncle      Prior to Admission medications   Medication Sig Start Date End Date Taking? Authorizing Provider  atorvastatin (LIPITOR) 80 MG tablet Take 80  mg by mouth daily.      buPROPion (WELLBUTRIN XL) 300 MG 24 hr tablet Take 1 tablet (300 mg total) by mouth daily. 08/19/19   Arfeen, Phillips Grout, MD  busPIRone (BUSPAR) 15 MG tablet Take 1 tablet (15 mg total) by mouth 3 (three) times daily. 08/19/19   Arfeen, Phillips Grout, MD  carvedilol (COREG) 12.5 MG tablet Take 12.5 mg by mouth 2 (two) times daily with a meal.    [provider]  doxycycline (VIBRAMYCIN) 100 MG capsule Take 1 capsule (100 mg total) by mouth 2 (two) times daily. 04/14/20   Cathren Laine, MD  escitalopram (LEXAPRO) 20 MG tablet Take 20 mg by mouth daily.    [provider]  hydrOXYzine (VISTARIL) 25 MG capsule Take 25-50 mg by mouth 2 (two) times daily as needed for anxiety.     [provider]  insulin aspart (NOVOLOG) 100 UNIT/ML injection Inject 15 Units into the skin 3 (three) times daily before meals.    [provider]  insulin glargine (LANTUS) 100 UNIT/ML injection Inject 0.4 mLs (40 Units total) into the skin daily. 12/23/19   Joseph Art, DO  lisinopril (ZESTRIL) 20 MG tablet Take 20 mg by mouth daily.     [provider]  oxyCODONE-acetaminophen (PERCOCET) 5-325 MG tablet Take 1 tablet by mouth every 4 (four) hours as needed for moderate pain or severe pain. 04/16/20   Mancel Bale, MD  pioglitazone (ACTOS) 15 MG tablet Take 15 mg by mouth daily.    [provider]  prazosin (MINIPRESS) 2 MG capsule Take 1 capsule (2 mg total) by mouth at bedtime. 08/19/19   Arfeen, Phillips Grout, MD  sildenafil (VIAGRA) 100 MG tablet Take 100 mg by mouth daily as needed for erectile dysfunction.    [provider]  traZODone (DESYREL) 50 MG tablet Take 1 tablet (50 mg total) by mouth at bedtime as needed. Patient taking differently: Take 50 mg by mouth at bedtime as needed for sleep.  08/19/19   [name], MD    Physical Exam: Vitals:   10/14/20 1244 10/14/20 1527 10/14/20 1545 10/14/20 1600  BP: (!) 148/94 (!) 164/61 (!) 148/68  (!) 139/59  Pulse: (!) 120 (!) 115 (!) 115 (!) 113  Resp: 18 (!) 24 (!) 22 (!) 25  Temp: 98.5 F (36.9 C)     TempSrc: Oral     SpO2: 99% 95% 100% 98%    Constitutional: NAD, nontoxic appearing male sitting upright in bed with discomfort and pain Vitals:   10/14/20 1244 10/14/20 1527 10/14/20 1545 10/14/20 1600  BP: (!) 148/94 (!) 164/61 (!) 148/68 (!) 139/59  Pulse: (!) 120 (!) 115 (!) 115 (!) 113  Resp: 18 (!) 24 (!) 22 (!) 25  Temp: 98.5 F (36.9 C)     TempSrc: Oral     SpO2: 99% 95% 100% 98%   Eyes: PERRL, lids and conjunctivae normal ENMT: Mucous membranes are moist.  Neck: normal, supple Respiratory: clear to auscultation bilaterally, no wheezing, no crackles. Normal respiratory effort. No accessory muscle use.  Cardiovascular: Regular rate  and rhythm, no murmurs / rubs / gallops. No extremity edema.  Abdomen: no tenderness, no masses palpated. No hepatosplenomegaly. Bowel sounds positive.  Musculoskeletal: no clubbing / cyanosis.  Right AKA  skin: Erythematous indurated area to left buttocks with pinpoint central fluctuance.  Fluctuant abscess noted to inferior gluteal cleft with tracking to rectal region.  Significant pain with palpation of either region.  There is also old scars noted to lower left and right lumbar region with no crepitus, erythema or edema.      Neurologic: CN 2-12 grossly intact. Sensation intact, strength 5 out of 5 in upper extremity and left lower extremity. Psychiatric: Normal judgment and insight. Alert and oriented x 3. Normal mood.    Labs on Admission: I have personally reviewed following labs and imaging studies  CBC: Recent Labs  Lab 10/12/20 1928 10/14/20 1447  WBC 19.4* 20.2*  NEUTROABS 15.9* 16.1*  HGB 11.5* 10.7*  HCT 34.7* 33.0*  MCV 85.7 86.6  PLT 214 200   Basic Metabolic Panel: Recent Labs  Lab 10/12/20 1928 10/14/20 1447  NA 129* 132*  K 4.3 4.5  CL 95* 99  CO2 22 23  GLUCOSE 437* 391*  BUN 24* 30*   CREATININE 1.35* 1.43*  CALCIUM 9.3 9.0   GFR: Estimated Creatinine Clearance: 76.4 mL/min (A) (by C-G formula based on SCr of 1.43 mg/dL (H)). Liver Function Tests: Recent Labs  Lab 10/12/20 1928 10/14/20 1447  AST 14* 12*  ALT 15 15  ALKPHOS 108 117  BILITOT 0.6 0.6  PROT 7.2 8.1  ALBUMIN 3.1* 3.3*   No results for input(s): LIPASE, AMYLASE in the last 168 hours. No results for input(s): AMMONIA in the last 168 hours. Coagulation Profile: Recent Labs  Lab 10/12/20 1928 10/14/20 1447  INR 1.0 1.2   Cardiac Enzymes: No results for input(s): CKTOTAL, CKMB, CKMBINDEX, TROPONINI in the last 168 hours. BNP (last 3 results) No results for input(s): PROBNP in the last 8760 hours. HbA1C: No results for input(s): HGBA1C in the last 72 hours. CBG: No results for input(s): GLUCAP in the last 168 hours. Lipid Profile: No results for input(s): CHOL, HDL, LDLCALC, TRIG, CHOLHDL, LDLDIRECT in the last 72 hours. Thyroid Function Tests: No results for input(s): TSH, T4TOTAL, FREET4, T3FREE, THYROIDAB in the last 72 hours. Anemia Panel: No results for input(s): VITAMINB12, FOLATE, FERRITIN, TIBC, IRON, RETICCTPCT in the last 72 hours. Urine analysis:    Component Value Date/Time   COLORURINE YELLOW 10/14/2020 1436   APPEARANCEUR CLEAR 10/14/2020 1436   LABSPEC 1.010 10/14/2020 1436   PHURINE 6.0 10/14/2020 1436   GLUCOSEU >=500 (A) 10/14/2020 1436   HGBUR NEGATIVE 10/14/2020 1436   BILIRUBINUR NEGATIVE 10/14/2020 1436   KETONESUR 20 (A) 10/14/2020 1436   PROTEINUR 30 (A) 10/14/2020 1436   NITRITE NEGATIVE 10/14/2020 1436   LEUKOCYTESUR NEGATIVE 10/14/2020 1436    Radiological Exams on Admission: CT ABDOMEN PELVIS W CONTRAST  Result Date: 10/14/2020 CLINICAL DATA:  Intrabdominal abscess evaluate size and extension of gluteal abscess EXAM: CT ABDOMEN AND PELVIS WITH CONTRAST TECHNIQUE: Multidetector CT imaging of the abdomen and pelvis was performed using the standard  protocol following bolus administration of intravenous contrast. CONTRAST:  8mL OMNIPAQUE IOHEXOL 350 MG/ML SOLN COMPARISON:  CT lumbar spine 12/06/2019 FINDINGS: Lower chest: 3 mm site of ground-glass nodularity in the subpleural aspect of the right lower lobe, (series 6, image 3). The included lung bases are otherwise clear. Heart size is normal. Hepatobiliary: Mildly decreased attenuation of the hepatic parenchyma  suggesting hepatic steatosis. No focal liver lesion identified. Tiny stone within the gallbladder. No evidence of gallbladder wall thickening or pericholecystic inflammatory changes by CT. No biliary dilatation. Pancreas: Unremarkable. No pancreatic ductal dilatation or surrounding inflammatory changes. Spleen: Normal in size without focal abnormality. Adrenals/Urinary Tract: Unremarkable adrenal glands. Kidneys enhance symmetrically without focal lesion, stone, or hydronephrosis. Ureters are nondilated. Urinary bladder appears unremarkable. Stomach/Bowel: Stomach is within normal limits. Appendix appears normal. No evidence of bowel wall thickening, distention, or inflammatory changes. Vascular/Lymphatic: Scattered aortoiliac atherosclerotic calcifications without aneurysm. Enlarged bilateral inguinal and external iliac chain lymph nodes with the largest node in the left external iliac chain measuring 2.0 cm short axis (series 2, image 86). No abdominal lymphadenopathy. Reproductive: Prostate is unremarkable. Other: No free fluid. No abdominopelvic fluid collection. No pneumoperitoneum. Tiny fat containing paraumbilical hernia. Musculoskeletal: There is prominent skin thickening of the posterior back and gluteal region. Small probable thick-walled collection in the region of the inferior gluteal cleft measuring up to 1.9 cm (series 2, image 84). This area closely approximates the sacrococcygeal junction. No definite bony destruction is identified to suggest osteomyelitis. A more ill-defined area of  increased density in the soft tissues along the superior aspect of the gluteal cleft appears similar in appearance to the prior CT from 2021. Additionally there are sites of increased density and stranding within the subcutaneous soft tissues of the upper left gluteal region (series 2, image 63), left flank (series 2, image 42) and lower right back (series 2, image 50), all of which were present on the prior CT lumbar spine from 12/06/2019. No soft tissue gas. Subtly sclerotic appearance of the T11 vertebral body without fracture or well-defined bone lesion. Findings could represent sequela of prior trauma, or possibly a bone lesion such as a intraosseous hemangioma. IMPRESSION: 1. Small probable thick-walled collection in the region of the inferior gluteal cleft measuring up to 1.9 cm may reflect a small phlegmon or abscess. This area closely approximates the sacrococcygeal junction. No definite bony destruction is identified to suggest osteomyelitis. 2. Enlarged bilateral inguinal and external iliac chain lymph nodes with the largest node in the left external iliac chain measuring up to 2.0 cm short axis. Findings are likely reactive. 3. Multiple sites of increased density and stranding within the subcutaneous soft tissues of the upper left gluteal region, left flank, and lower right back. These findings were all present on the prior CT from November of 2021 and may reflect sequela of prior trauma or infection. 4. Cholelithiasis without evidence of acute cholecystitis. 5. Subtly sclerotic appearance of the T11 vertebral body without fracture or well-defined bone lesion. This is unchanged from the prior CT and felt to most likely represent sequela of prior trauma, or possibly a bone lesion such as a intraosseous hemangioma. 6. 3 mm site of ground-glass nodularity in the subpleural aspect of the right lower lobe, likely infectious or inflammatory in etiology. 7. Hepatic steatosis. Aortic Atherosclerosis  (ICD10-I70.0). Electronically Signed   By: Duanne Guess D.O.   On: 10/14/2020 18:18      Assessment/Plan  Sepsis secondary to left buttocks cellulitis and pilonidal abscess -Patient presented with tachycardia, tachypnea and leukocytosis - Has history of MRSA cultures from history of recurrent abscess.  Continue IV vancomycin and Rocephin -Have consulted general surgery Dr. Carolynne Edouard requiring the need for I&D and he will see in consultation tomorrow morning  Hyperglycemia with history of insulin-dependent type 2 diabetes - BG of 391 with no anion gap.  Patient reports he  has not taken his insulin in 2 days because he had difficulty controlling his hand to check his blood glucose - Normally on 50 units of Lantus in the morning and 25 at night.  Sliding scale Humalog 3 times daily with meals. -We will start with 25 units of Lantus BID, resistant sliding scale TID w/meals  Hypertension - Controlled - Continue Coreg, lisinopril, prazosin  CKD stage III - Creatinine stable  Depression/bipolar disorder - Continue Abilify, bupropion, BuSpar, Lexapro, as needed hydroxyzine and trazodone  DVT prophylaxis:.SCDs Code Status: Full Family Communication: Plan discussed with patient at bedside  disposition Plan: Home with observation Consults called: General surgery Admission status: Observation  Level of care: Telemetry  Status is: Observation  The patient remains OBS appropriate and will d/c before 2 midnights.  Dispo: The patient is from: Home              Anticipated d/c is to: Home              Patient currently is not medically stable to d/c.   Difficult to place patient No         Anselm Jungling DO [facility]   If 7PM-7AM, please contact night-coverage [email or web address]   10/14/2020, 7:27 PM

## 2020-10-14 NOTE — ED Provider Notes (Signed)
Emergency Medicine Provider Triage Evaluation Note  Mark [name] , a 60 y.o. male  was evaluated in triage.  Pt complains of multiple infected pilonidal cyst on his anus.  Patient came to [facility] 2 days ago reports that he had an 8-1/2-hour wait time without seeing a provider so he left.  Patient reports he has 3 pilonidal cysts in the lower aspect of the gluteal cleft that are actively draining, minimal pain, with 1 large pilonidal cyst apex of the sacrum that is slowly draining, and exquisitely painful.  Patient is taking Percocet at home for pain.  Review of Systems  Positive: Cyst/abscess Negative: Fever, chills  Physical Exam  BP (!) 148/94 (BP Location: Left Arm)   Pulse (!) 120   Temp 98.5 F (36.9 C) (Oral)   Resp 18   SpO2 99%  Gen:   Awake, no distress   Resp:  Normal effort  MSK:   Moves extremities without difficulty, AKA on right side Other:  Anus not examined in triage  Medical Decision Making  Medically screening exam initiated at 12:53 PM.  Appropriate orders placed.  Mark [name] was informed that the remainder of the evaluation will be completed by another provider, this initial triage assessment does not replace that evaluation, and the importance of remaining in the ED until their evaluation is complete.  Pilonidal cyst infection   Mark [name] 10/14/20 1257    Mark Monday, MD 10/15/20 [phone]

## 2020-10-14 NOTE — Sepsis Progress Note (Signed)
eLink is monitoring this Code Sepsis. °

## 2020-10-15 DIAGNOSIS — Z806 Family history of leukemia: Secondary | ICD-10-CM | POA: Diagnosis not present

## 2020-10-15 DIAGNOSIS — Z20822 Contact with and (suspected) exposure to covid-19: Secondary | ICD-10-CM | POA: Diagnosis present

## 2020-10-15 DIAGNOSIS — E785 Hyperlipidemia, unspecified: Secondary | ICD-10-CM | POA: Diagnosis present

## 2020-10-15 DIAGNOSIS — R739 Hyperglycemia, unspecified: Secondary | ICD-10-CM | POA: Diagnosis not present

## 2020-10-15 DIAGNOSIS — E1122 Type 2 diabetes mellitus with diabetic chronic kidney disease: Secondary | ICD-10-CM | POA: Diagnosis present

## 2020-10-15 DIAGNOSIS — E1159 Type 2 diabetes mellitus with other circulatory complications: Secondary | ICD-10-CM | POA: Diagnosis not present

## 2020-10-15 DIAGNOSIS — L0231 Cutaneous abscess of buttock: Secondary | ICD-10-CM | POA: Diagnosis not present

## 2020-10-15 DIAGNOSIS — Z89611 Acquired absence of right leg above knee: Secondary | ICD-10-CM | POA: Diagnosis not present

## 2020-10-15 DIAGNOSIS — E1165 Type 2 diabetes mellitus with hyperglycemia: Secondary | ICD-10-CM | POA: Diagnosis present

## 2020-10-15 DIAGNOSIS — F319 Bipolar disorder, unspecified: Secondary | ICD-10-CM | POA: Diagnosis present

## 2020-10-15 DIAGNOSIS — I129 Hypertensive chronic kidney disease with stage 1 through stage 4 chronic kidney disease, or unspecified chronic kidney disease: Secondary | ICD-10-CM | POA: Diagnosis present

## 2020-10-15 DIAGNOSIS — E669 Obesity, unspecified: Secondary | ICD-10-CM | POA: Diagnosis present

## 2020-10-15 DIAGNOSIS — Z79899 Other long term (current) drug therapy: Secondary | ICD-10-CM | POA: Diagnosis not present

## 2020-10-15 DIAGNOSIS — N1831 Chronic kidney disease, stage 3a: Secondary | ICD-10-CM | POA: Diagnosis present

## 2020-10-15 DIAGNOSIS — E1169 Type 2 diabetes mellitus with other specified complication: Secondary | ICD-10-CM | POA: Diagnosis present

## 2020-10-15 DIAGNOSIS — L0501 Pilonidal cyst with abscess: Secondary | ICD-10-CM | POA: Diagnosis not present

## 2020-10-15 DIAGNOSIS — F4310 Post-traumatic stress disorder, unspecified: Secondary | ICD-10-CM | POA: Diagnosis present

## 2020-10-15 DIAGNOSIS — I152 Hypertension secondary to endocrine disorders: Secondary | ICD-10-CM | POA: Diagnosis present

## 2020-10-15 DIAGNOSIS — Z87891 Personal history of nicotine dependence: Secondary | ICD-10-CM | POA: Diagnosis not present

## 2020-10-15 DIAGNOSIS — Z794 Long term (current) use of insulin: Secondary | ICD-10-CM | POA: Diagnosis not present

## 2020-10-15 DIAGNOSIS — F411 Generalized anxiety disorder: Secondary | ICD-10-CM | POA: Diagnosis present

## 2020-10-15 DIAGNOSIS — L03317 Cellulitis of buttock: Secondary | ICD-10-CM | POA: Diagnosis present

## 2020-10-15 DIAGNOSIS — A419 Sepsis, unspecified organism: Secondary | ICD-10-CM | POA: Diagnosis present

## 2020-10-15 DIAGNOSIS — Z6834 Body mass index (BMI) 34.0-34.9, adult: Secondary | ICD-10-CM | POA: Diagnosis not present

## 2020-10-15 LAB — BLOOD CULTURE ID PANEL (REFLEXED) - BCID2

## 2020-10-15 LAB — BASIC METABOLIC PANEL
Anion gap: 19 — ABNORMAL HIGH (ref 5–15)
BUN: 25 mg/dL — ABNORMAL HIGH (ref 6–20)
CO2: 17 mmol/L — ABNORMAL LOW (ref 22–32)
Calcium: 8.5 mg/dL — ABNORMAL LOW (ref 8.9–10.3)
Chloride: 94 mmol/L — ABNORMAL LOW (ref 98–111)
Creatinine, Ser: 1.47 mg/dL — ABNORMAL HIGH (ref 0.61–1.24)
GFR, Estimated: 54 mL/min — ABNORMAL LOW (ref >60)
Glucose, Bld: 378 mg/dL — ABNORMAL HIGH (ref 70–99)
Potassium: 4.4 mmol/L (ref 3.5–5.1)
Sodium: 130 mmol/L — ABNORMAL LOW (ref 135–145)

## 2020-10-15 LAB — GLUCOSE, CAPILLARY
Glucose-Capillary: 355 mg/dL — ABNORMAL HIGH (ref 70–99)
Glucose-Capillary: 311 mg/dL — ABNORMAL HIGH (ref 70–99)
Glucose-Capillary: 277 mg/dL — ABNORMAL HIGH (ref 70–99)
Glucose-Capillary: 281 mg/dL — ABNORMAL HIGH (ref 70–99)

## 2020-10-15 LAB — CBC
HCT: 31.9 % — ABNORMAL LOW (ref 39.0–52.0)
Hemoglobin: 10.4 g/dL — ABNORMAL LOW (ref 13.0–17.0)
MCH: 28 pg (ref 26.0–34.0)
MCHC: 32.6 g/dL (ref 30.0–36.0)
MCV: 86 fL (ref 80.0–100.0)
Platelets: 197 10*3/uL (ref 150–400)
RBC: 3.71 MIL/uL — ABNORMAL LOW (ref 4.22–5.81)
RDW: 13.4 % (ref 11.5–15.5)
WBC: 21.5 10*3/uL — ABNORMAL HIGH (ref 4.0–10.5)
nRBC: 0 % (ref 0.0–0.2)

## 2020-10-15 LAB — SURGICAL PCR SCREEN
MRSA, PCR: POSITIVE — AB
Staphylococcus aureus: POSITIVE — AB

## 2020-10-15 LAB — HEMOGLOBIN A1C
Hgb A1c MFr Bld: 9.9 % — ABNORMAL HIGH (ref 4.8–5.6)
Mean Plasma Glucose: 237.43 mg/dL

## 2020-10-15 MED ORDER — ONDANSETRON HCL 4 MG/2ML IJ SOLN
4.0000 mg | Freq: Three times a day (TID) | INTRAMUSCULAR | Status: DC | PRN
  Administered 2020-10-15 – 2020-10-16 (×3): 4 mg via INTRAVENOUS
  Filled 2020-10-15 (×3): qty 2

## 2020-10-15 MED ORDER — CALCIUM CARBONATE ANTACID 500 MG PO CHEW
1.0000 | CHEWABLE_TABLET | Freq: Three times a day (TID) | ORAL | Status: DC | PRN
  Administered 2020-10-15 – 2020-10-16 (×2): 200 mg via ORAL
  Filled 2020-10-15 (×2): qty 1

## 2020-10-15 MED ORDER — INSULIN ASPART 100 UNIT/ML IJ SOLN
0.0000 [IU] | Freq: Every day | INTRAMUSCULAR | Status: DC
  Administered 2020-10-15: 3 [IU] via SUBCUTANEOUS
  Administered 2020-10-16: 2 [IU] via SUBCUTANEOUS

## 2020-10-15 MED ORDER — INSULIN ASPART 100 UNIT/ML IJ SOLN
0.0000 [IU] | Freq: Three times a day (TID) | INTRAMUSCULAR | Status: DC
  Administered 2020-10-15: 20 [IU] via SUBCUTANEOUS
  Administered 2020-10-15: 15 [IU] via SUBCUTANEOUS
  Administered 2020-10-15: 11 [IU] via SUBCUTANEOUS
  Administered 2020-10-16: 15 [IU] via SUBCUTANEOUS
  Administered 2020-10-16: 7 [IU] via SUBCUTANEOUS
  Administered 2020-10-17 – 2020-10-18 (×2): 4 [IU] via SUBCUTANEOUS
  Administered 2020-10-18: 7 [IU] via SUBCUTANEOUS

## 2020-10-15 MED ORDER — MENTHOL 3 MG MT LOZG
1.0000 | LOZENGE | OROMUCOSAL | Status: DC | PRN
  Administered 2020-10-15: 3 mg via ORAL
  Filled 2020-10-15: qty 9

## 2020-10-15 MED ORDER — PANTOPRAZOLE SODIUM 20 MG PO TBEC
20.0000 mg | DELAYED_RELEASE_TABLET | Freq: Every day | ORAL | Status: DC
  Administered 2020-10-15 – 2020-10-18 (×4): 20 mg via ORAL
  Filled 2020-10-15 (×4): qty 1

## 2020-10-15 MED ORDER — INSULIN ASPART 100 UNIT/ML IJ SOLN
6.0000 [IU] | Freq: Three times a day (TID) | INTRAMUSCULAR | Status: DC
  Administered 2020-10-15 – 2020-10-18 (×9): 6 [IU] via SUBCUTANEOUS

## 2020-10-15 MED ORDER — MUPIROCIN 2 % EX OINT
1.0000 "application " | TOPICAL_OINTMENT | Freq: Two times a day (BID) | CUTANEOUS | Status: DC
  Administered 2020-10-15 – 2020-10-18 (×7): 1 via NASAL
  Filled 2020-10-15 (×2): qty 22

## 2020-10-15 MED ORDER — INSULIN GLARGINE-YFGN 100 UNIT/ML ~~LOC~~ SOLN
35.0000 [IU] | Freq: Two times a day (BID) | SUBCUTANEOUS | Status: DC
  Administered 2020-10-15 – 2020-10-18 (×7): 35 [IU] via SUBCUTANEOUS
  Filled 2020-10-15 (×8): qty 0.35

## 2020-10-15 MED ORDER — CHLORHEXIDINE GLUCONATE CLOTH 2 % EX PADS
6.0000 | MEDICATED_PAD | Freq: Every day | CUTANEOUS | Status: DC
  Administered 2020-10-16 – 2020-10-18 (×3): 6 via TOPICAL

## 2020-10-15 MED ORDER — HEPARIN SODIUM (PORCINE) 5000 UNIT/ML IJ SOLN
5000.0000 [IU] | Freq: Three times a day (TID) | INTRAMUSCULAR | Status: DC
  Administered 2020-10-15 – 2020-10-18 (×10): 5000 [IU] via SUBCUTANEOUS
  Filled 2020-10-15 (×10): qty 1

## 2020-10-15 NOTE — Progress Notes (Signed)
Subjective/Chief Complaint: Complains of pain in left buttock, unchanged   Objective: Vital signs in last 24 hours: Temp:  [97.6 F (36.4 C)-98.6 F (37 C)] 97.6 F (36.4 C) (09/24 0819) Pulse Rate:  [109-123] 117 (09/24 0819) Resp:  [18-25] 18 (09/24 0819) BP: (123-164)/(53-94) 157/53 (09/24 0819) SpO2:  [92 %-100 %] 100 % (09/24 0819) Last BM Date: 10/13/20  Intake/Output from previous day: 09/23 0701 - 09/24 0700 In: 1231.4 [I.V.:1231.4] Out: 1500 [Urine:1500] Intake/Output this shift: No intake/output data recorded.  General appearance: alert and cooperative Resp: clear to auscultation bilaterally Cardio: regular rate and rhythm GI: left buttock with induration possible superficial skin infection  Lab Results:  Recent Labs    10/14/20 1447 10/15/20 0347  WBC 20.2* 21.5*  HGB 10.7* 10.4*  HCT 33.0* 31.9*  PLT 200 197   BMET Recent Labs    10/14/20 1447 10/15/20 0347  NA 132* 130*  K 4.5 4.4  CL 99 94*  CO2 23 17*  GLUCOSE 391* 378*  BUN 30* 25*  CREATININE 1.43* 1.47*  CALCIUM 9.0 8.5*   PT/INR Recent Labs    10/12/20 1928 10/14/20 1447  LABPROT 13.7 15.6*  INR 1.0 1.2   ABG No results for input(s): PHART, HCO3 in the last 72 hours.  Invalid input(s): PCO2, PO2  Studies/Results: CT ABDOMEN PELVIS W CONTRAST  Result Date: 10/14/2020 CLINICAL DATA:  Intrabdominal abscess evaluate size and extension of gluteal abscess EXAM: CT ABDOMEN AND PELVIS WITH CONTRAST TECHNIQUE: Multidetector CT imaging of the abdomen and pelvis was performed using the standard protocol following bolus administration of intravenous contrast. CONTRAST:  30mL OMNIPAQUE IOHEXOL 350 MG/ML SOLN COMPARISON:  CT lumbar spine 12/06/2019 FINDINGS: Lower chest: 3 mm site of ground-glass nodularity in the subpleural aspect of the right lower lobe, (series 6, image 3). The included lung bases are otherwise clear. Heart size is normal. Hepatobiliary: Mildly decreased attenuation of  the hepatic parenchyma suggesting hepatic steatosis. No focal liver lesion identified. Tiny stone within the gallbladder. No evidence of gallbladder wall thickening or pericholecystic inflammatory changes by CT. No biliary dilatation. Pancreas: Unremarkable. No pancreatic ductal dilatation or surrounding inflammatory changes. Spleen: Normal in size without focal abnormality. Adrenals/Urinary Tract: Unremarkable adrenal glands. Kidneys enhance symmetrically without focal lesion, stone, or hydronephrosis. Ureters are nondilated. Urinary bladder appears unremarkable. Stomach/Bowel: Stomach is within normal limits. Appendix appears normal. No evidence of bowel wall thickening, distention, or inflammatory changes. Vascular/Lymphatic: Scattered aortoiliac atherosclerotic calcifications without aneurysm. Enlarged bilateral inguinal and external iliac chain lymph nodes with the largest node in the left external iliac chain measuring 2.0 cm short axis (series 2, image 86). No abdominal lymphadenopathy. Reproductive: Prostate is unremarkable. Other: No free fluid. No abdominopelvic fluid collection. No pneumoperitoneum. Tiny fat containing paraumbilical hernia. Musculoskeletal: There is prominent skin thickening of the posterior back and gluteal region. Small probable thick-walled collection in the region of the inferior gluteal cleft measuring up to 1.9 cm (series 2, image 84). This area closely approximates the sacrococcygeal junction. No definite bony destruction is identified to suggest osteomyelitis. A more ill-defined area of increased density in the soft tissues along the superior aspect of the gluteal cleft appears similar in appearance to the prior CT from 2021. Additionally there are sites of increased density and stranding within the subcutaneous soft tissues of the upper left gluteal region (series 2, image 63), left flank (series 2, image 42) and lower right back (series 2, image 50), all of which were present on  the prior  CT lumbar spine from 12/06/2019. No soft tissue gas. Subtly sclerotic appearance of the T11 vertebral body without fracture or well-defined bone lesion. Findings could represent sequela of prior trauma, or possibly a bone lesion such as a intraosseous hemangioma. IMPRESSION: 1. Small probable thick-walled collection in the region of the inferior gluteal cleft measuring up to 1.9 cm may reflect a small phlegmon or abscess. This area closely approximates the sacrococcygeal junction. No definite bony destruction is identified to suggest osteomyelitis. 2. Enlarged bilateral inguinal and external iliac chain lymph nodes with the largest node in the left external iliac chain measuring up to 2.0 cm short axis. Findings are likely reactive. 3. Multiple sites of increased density and stranding within the subcutaneous soft tissues of the upper left gluteal region, left flank, and lower right back. These findings were all present on the prior CT from November of 2021 and may reflect sequela of prior trauma or infection. 4. Cholelithiasis without evidence of acute cholecystitis. 5. Subtly sclerotic appearance of the T11 vertebral body without fracture or well-defined bone lesion. This is unchanged from the prior CT and felt to most likely represent sequela of prior trauma, or possibly a bone lesion such as a intraosseous hemangioma. 6. 3 mm site of ground-glass nodularity in the subpleural aspect of the right lower lobe, likely infectious or inflammatory in etiology. 7. Hepatic steatosis. Aortic Atherosclerosis (ICD10-I70.0). Electronically Signed   By: Duanne Guess D.O.   On: 10/14/2020 18:18    Anti-infectives: Anti-infectives (From admission, onward)    Start     Dose/Rate Route Frequency Ordered Stop   10/15/20 1600  vancomycin (VANCOREADY) IVPB 1750 mg/350 mL        1,750 mg 175 mL/hr over 120 Minutes Intravenous Every 24 hours 10/14/20 1958     10/15/20 1500  cefTRIAXone (ROCEPHIN) 2 g in sodium  chloride 0.9 % 100 mL IVPB        2 g 200 mL/hr over 30 Minutes Intravenous Every 24 hours 10/14/20 1924 10/21/20 1459   10/14/20 1415  vancomycin (VANCOCIN) IVPB 1000 mg/200 mL premix  Status:  Discontinued        1,000 mg 200 mL/hr over 60 Minutes Intravenous  Once 10/14/20 1406 10/14/20 1410   10/14/20 1415  cefTRIAXone (ROCEPHIN) 2 g in sodium chloride 0.9 % 100 mL IVPB        2 g 200 mL/hr over 30 Minutes Intravenous  Once 10/14/20 1406 10/14/20 1546   10/14/20 1415  vancomycin (VANCOREADY) IVPB 2000 mg/400 mL        2,000 mg 200 mL/hr over 120 Minutes Intravenous  Once 10/14/20 1412 10/14/20 1858       Assessment/Plan: s/p * No surgery found * Advance diet Continue IV abx. Wbc slightly up from yesterday but abx only started yesterday If no improvement by tomorrow then will plan for I and D buttock area. Risks and benefits of the surgery discussed with patient and he understands and agrees with the plan  LOS: 0 days    Mark [name] 10/15/2020
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
## 2020-10-15 NOTE — Progress Notes (Signed)
VA notification completed. VA notification ID is [id number].

## 2020-10-15 NOTE — Progress Notes (Signed)
PHARMACY - PHYSICIAN COMMUNICATION CRITICAL VALUE ALERT - BLOOD CULTURE IDENTIFICATION (BCID)  Mark [name] is an 60 y.o. male who presented to [facility] on 10/14/2020 with a chief complaint of worsening buttock abscess  Assessment:   1 of 2 bottles anaerobic, strep species.   Name of physician (or Provider) Contacted: X. Blount via secure chat  Current antibiotics: ceftriaxone & vancomycin  Changes to prescribed antibiotics recommended:  No changes needed  Results for orders placed or performed during the hospital encounter of 10/14/20  Blood Culture ID Panel (Reflexed) (Collected: 10/14/2020  2:49 PM)  Result Value Ref Range   Enterococcus faecalis NOT DETECTED NOT DETECTED   Enterococcus Faecium NOT DETECTED NOT DETECTED   Listeria monocytogenes NOT DETECTED NOT DETECTED   Staphylococcus species NOT DETECTED NOT DETECTED   Staphylococcus aureus (BCID) NOT DETECTED NOT DETECTED   Staphylococcus epidermidis NOT DETECTED NOT DETECTED   Staphylococcus lugdunensis NOT DETECTED NOT DETECTED   Streptococcus species DETECTED (A) NOT DETECTED   Streptococcus agalactiae NOT DETECTED NOT DETECTED   Streptococcus pneumoniae NOT DETECTED NOT DETECTED   Streptococcus pyogenes NOT DETECTED NOT DETECTED   A.calcoaceticus-baumannii NOT DETECTED NOT DETECTED   Bacteroides fragilis NOT DETECTED NOT DETECTED   Enterobacterales NOT DETECTED NOT DETECTED   Enterobacter cloacae complex NOT DETECTED NOT DETECTED   Escherichia coli NOT DETECTED NOT DETECTED   Klebsiella aerogenes NOT DETECTED NOT DETECTED   Klebsiella oxytoca NOT DETECTED NOT DETECTED   Klebsiella pneumoniae NOT DETECTED NOT DETECTED   Proteus species NOT DETECTED NOT DETECTED   Salmonella species NOT DETECTED NOT DETECTED   Serratia marcescens NOT DETECTED NOT DETECTED   Haemophilus influenzae NOT DETECTED NOT DETECTED   Neisseria meningitidis NOT DETECTED NOT DETECTED   Pseudomonas aeruginosa NOT DETECTED NOT DETECTED    Stenotrophomonas maltophilia NOT DETECTED NOT DETECTED   Candida albicans NOT DETECTED NOT DETECTED   Candida auris NOT DETECTED NOT DETECTED   Candida glabrata NOT DETECTED NOT DETECTED   Candida krusei NOT DETECTED NOT DETECTED   Candida parapsilosis NOT DETECTED NOT DETECTED   Candida tropicalis NOT DETECTED NOT DETECTED   Cryptococcus neoformans/gattii NOT DETECTED NOT DETECTED     Herby Abraham, Pharm.D 10/15/2020 10:50 PM

## 2020-10-15 NOTE — ED Provider Notes (Signed)
[facility]LO[facility]Y Provider Note   CSN: [id number] Arrival date & time: 10/14/20  1238     History Chief Complaint  Patient presents with   Recurrent Skin Infections    Quavis Klutz is a 60 y.o. male.  HPI     60 year old male with a history of hypertension, OSA, insulin-dependent type 2 diabetes, CKD stage III, history of recurrent abscesses, pilonidal cyst with history of surgery and surgical drainage, AKA, who presents with concern for left buttock abscess.  Reports that symptoms began on Monday, consistent with prior abscess symptoms with increased pain over his buttock and had been some drainage.  It is there is an area of his left superior buttock that is the most painful, but has also had increased pain over the area of his pilonidal cyst.  He reports having a fever of 101.  He went to the emergency department at [facility] due to his symptoms, but left after waiting for 8-1/2 hours to be seen due to severe pain.  Reports the pain is severe worse with movement.  Denies nausea or vomiting.  Denies urinary symptoms, cough.  Reports his sugars have been running very high.  Past Medical History:  Diagnosis Date   Above knee amputation status, right    Anxiety    Depression    Diabetes mellitus, type II (HCC)    Hyperlipemia    Hypertension    PTSD (post-traumatic stress disorder)    Shoulder pain     Patient Active Problem List   Diagnosis Date Noted   Cellulitis and abscess of buttock 10/14/2020   Sepsis (HCC) 10/14/2020   Hyperglycemia 10/14/2020   Abscess    Pressure injury of skin 12/08/2019   Diabetic ketoacidosis associated with type 2 diabetes mellitus (HCC) 12/04/2019   AKI (acute kidney injury) (HCC) 12/04/2019   Depression with anxiety 12/04/2019   Hypertension associated with diabetes (HCC) 12/04/2019   Hyperlipidemia associated with type 2 diabetes mellitus (HCC) 12/04/2019   Cellulitis of left lower extremity 12/04/2019    Cellulitis of lower back 12/04/2019   PTSD (post-traumatic stress disorder) 04/15/2017   Major depressive disorder, recurrent episode, moderate (HCC) 04/30/2016   Generalized anxiety disorder 04/30/2016    Past Surgical History:  Procedure Laterality Date   HERNIA REPAIR     INCISION AND DRAINAGE Left 12/11/2019   Procedure: INCISION AND DRAINAGE POSTERIOR LEFT THIGH ABSCESS;  Surgeon: Axel Filler, MD;  Location: [facility];  Service: General;  Laterality: Left;   INCISION AND DRAINAGE ABSCESS Left 12/11/2019   Procedure: INCISION AND DRAINAGE BACK ABSCESS,;  Surgeon: Axel Filler, MD;  Location: [facility] OR;  Service: General;  Laterality: Left;   LEG AMPUTATION Right    parenatal cyst         Family History  Problem Relation Age of Onset   Aneurysm Mother    Leukemia Father    Dementia Maternal Aunt    Seizures Paternal Uncle     Social History   Tobacco Use   Smoking status: Former    Types: Cigarettes    Quit date: 08/22/2019    Years since quitting: 1.1   Smokeless tobacco: Never  Vaping Use   Vaping Use: Never used  Substance Use Topics   Alcohol use: Yes    Alcohol/week: 4.0 standard drinks    Types: 2 Cans of beer, 2 Shots of liquor per week    Comment: Reports light drinking weekly.   Drug use: Yes  Frequency: 14.0 times per week    Types: Marijuana    Comment: Uses 1-2 times a day per report for his anxiety    Home Medications Prior to Admission medications   Medication Sig Start Date End Date Taking? Authorizing Provider  ARIPiprazole (ABILIFY) 10 MG tablet Take 10 mg by mouth daily.   Yes [provider]  atorvastatin (LIPITOR) 80 MG tablet Take 80 mg by mouth daily.   Yes   buPROPion (WELLBUTRIN XL) 300 MG 24 hr tablet Take 1 tablet (300 mg total) by mouth daily. 08/19/19  Yes Arfeen, Phillips Grout, MD  busPIRone (BUSPAR) 15 MG tablet Take 1 tablet (15 mg total) by mouth 3 (three) times daily. 08/19/19  Yes Arfeen, Phillips Grout, MD  carvedilol (COREG) 12.5  MG tablet Take 12.5 mg by mouth 2 (two) times daily with a meal.   Yes [provider]  escitalopram (LEXAPRO) 20 MG tablet Take 20 mg by mouth daily.   Yes [provider]  hydrOXYzine (VISTARIL) 25 MG capsule Take 25-50 mg by mouth 2 (two) times daily as needed for anxiety.    Yes [provider]  insulin aspart (NOVOLOG) 100 UNIT/ML injection Inject 15 Units into the skin 3 (three) times daily before meals.   Yes [provider]  insulin glargine (LANTUS) 100 UNIT/ML injection Inject 0.4 mLs (40 Units total) into the skin daily. Patient taking differently: Inject 25-50 Units into the skin daily. Inject 50 units subcutaneous in the morning,  then inject  25 units subcutaneous at night per patient 12/23/19  Yes Marlin Canary U, DO  lisinopril (ZESTRIL) 20 MG tablet Take 20 mg by mouth daily.    Yes [provider]  oxyCODONE-acetaminophen (PERCOCET) 5-325 MG tablet Take 1 tablet by mouth every 4 (four) hours as needed for moderate pain or severe pain. 04/16/20  Yes Mancel Bale, MD  pioglitazone (ACTOS) 15 MG tablet Take 15 mg by mouth daily.   Yes [provider]  prazosin (MINIPRESS) 2 MG capsule Take 1 capsule (2 mg total) by mouth at bedtime. 08/19/19  Yes Arfeen, Phillips Grout, MD  sildenafil (VIAGRA) 100 MG tablet Take 100 mg by mouth daily as needed for erectile dysfunction.   Yes [provider]  traZODone (DESYREL) 50 MG tablet Take 1 tablet (50 mg total) by mouth at bedtime as needed. Patient taking differently: Take 50 mg by mouth at bedtime as needed for sleep. 08/19/19  Yes Arfeen, Phillips Grout, MD  doxycycline (VIBRAMYCIN) 100 MG capsule Take 1 capsule (100 mg total) by mouth 2 (two) times daily. Patient not taking: No sig reported 04/14/20   Cathren Laine, MD    Allergies    Ozempic (0.25 or 0.5 mg-dose) [semaglutide(0.25 or 0.5mg -dos)], Semaglutide, and Lyrica [pregabalin]  Review of Systems   Review of Systems  Constitutional:   Positive for fatigue and fever.  HENT:  Negative for sore throat.   Eyes:  Negative for visual disturbance.  Respiratory:  Negative for shortness of breath.   Cardiovascular:  Negative for chest pain.  Gastrointestinal:  Negative for abdominal pain, diarrhea, nausea and vomiting.  Genitourinary:  Negative for difficulty urinating.  Musculoskeletal:  Positive for back pain and myalgias.  Skin:  Positive for rash and wound.  Neurological:  Negative for syncope and headaches.   Physical Exam Updated Vital Signs BP (!) 123/59 (BP Location: Left Arm)   Pulse (!) 109   Temp 98.6 F (37 C) (Oral)   Resp 20   SpO2  99%   Physical Exam Vitals and nursing note reviewed.  Constitutional:      General: He is not in acute distress.    Appearance: He is well-developed. He is not diaphoretic.  HENT:     Head: Normocephalic and atraumatic.  Eyes:     Conjunctiva/sclera: Conjunctivae normal.  Cardiovascular:     Rate and Rhythm: Regular rhythm. Tachycardia present.     Pulses: Normal pulses.  Pulmonary:     Effort: Pulmonary effort is normal. No respiratory distress.     Breath sounds: Normal breath sounds. No wheezing or rales.  Abdominal:     General: There is no distension.     Palpations: Abdomen is soft.     Tenderness: There is no abdominal tenderness. There is no guarding.  Musculoskeletal:     Cervical back: Normal range of motion.  Skin:    General: Skin is warm and dry.     Comments: Tenderness and edema, firm area to entire left buttock with particular 12cm area of firm induration surrounding 3cm area of erythema, wound, purulent drainage, prior pilonidal cyst surgical scar gluteal cleft with wound  Neurological:     Mental Status: He is alert and oriented to person, place, and time.    ED Results / Procedures / Treatments   Labs (all labs ordered are listed, but only abnormal results are displayed) Labs Reviewed  COMPREHENSIVE METABOLIC PANEL - Abnormal; Notable for the  following components:      Result Value   Sodium 132 (*)    Glucose, Bld 391 (*)    BUN 30 (*)    Creatinine, Ser 1.43 (*)    Albumin 3.3 (*)    AST 12 (*)    GFR, Estimated 56 (*)    All other components within normal limits  CBC WITH DIFFERENTIAL/PLATELET - Abnormal; Notable for the following components:   WBC 20.2 (*)    RBC 3.81 (*)    Hemoglobin 10.7 (*)    HCT 33.0 (*)    Neutro Abs 16.1 (*)    Monocytes Absolute 1.7 (*)    Abs Immature Granulocytes 0.36 (*)    All other components within normal limits  PROTIME-INR - Abnormal; Notable for the following components:   Prothrombin Time 15.6 (*)    All other components within normal limits  APTT - Abnormal; Notable for the following components:   aPTT 38 (*)    All other components within normal limits  URINALYSIS, ROUTINE W REFLEX MICROSCOPIC - Abnormal; Notable for the following components:   Glucose, UA >=500 (*)    Ketones, ur 20 (*)    Protein, ur 30 (*)    All other components within normal limits  BASIC METABOLIC PANEL - Abnormal; Notable for the following components:   Sodium 130 (*)    Chloride 94 (*)    CO2 17 (*)    Glucose, Bld 378 (*)    BUN 25 (*)    Creatinine, Ser 1.47 (*)    Calcium 8.5 (*)    GFR, Estimated 54 (*)    Anion gap 19 (*)    All other components within normal limits  CBC - Abnormal; Notable for the following components:   WBC 21.5 (*)    RBC 3.71 (*)    Hemoglobin 10.4 (*)    HCT 31.9 (*)    All other components within normal limits  GLUCOSE, CAPILLARY - Abnormal; Notable for the following components:   Glucose-Capillary 341 (*)  All other components within normal limits  RESP PANEL BY RT-PCR (FLU A&B, COVID) ARPGX2  CULTURE, BLOOD (ROUTINE X 2)  CULTURE, BLOOD (ROUTINE X 2)  LACTIC ACID, PLASMA  LACTIC ACID, PLASMA  HEMOGLOBIN A1C    EKG EKG Interpretation  Date/Time:  Friday October 14 2020 14:22:19 EDT Ventricular Rate:  115 PR Interval:  147 QRS Duration: 86 QT  Interval:  290 QTC Calculation: 401 R Axis:   69 Text Interpretation: Sinus tachycardia Nonspecific repol abnormality, lateral leads Confirmed by Virgina Norfolk ([id number]) on 10/14/2020 4:34:38 PM  Radiology CT ABDOMEN PELVIS W CONTRAST  Result Date: 10/14/2020 CLINICAL DATA:  Intrabdominal abscess evaluate size and extension of gluteal abscess EXAM: CT ABDOMEN AND PELVIS WITH CONTRAST TECHNIQUE: Multidetector CT imaging of the abdomen and pelvis was performed using the standard protocol following bolus administration of intravenous contrast. CONTRAST:  78mL OMNIPAQUE IOHEXOL 350 MG/ML SOLN COMPARISON:  CT lumbar spine 12/06/2019 FINDINGS: Lower chest: 3 mm site of ground-glass nodularity in the subpleural aspect of the right lower lobe, (series 6, image 3). The included lung bases are otherwise clear. Heart size is normal. Hepatobiliary: Mildly decreased attenuation of the hepatic parenchyma suggesting hepatic steatosis. No focal liver lesion identified. Tiny stone within the gallbladder. No evidence of gallbladder wall thickening or pericholecystic inflammatory changes by CT. No biliary dilatation. Pancreas: Unremarkable. No pancreatic ductal dilatation or surrounding inflammatory changes. Spleen: Normal in size without focal abnormality. Adrenals/Urinary Tract: Unremarkable adrenal glands. Kidneys enhance symmetrically without focal lesion, stone, or hydronephrosis. Ureters are nondilated. Urinary bladder appears unremarkable. Stomach/Bowel: Stomach is within normal limits. Appendix appears normal. No evidence of bowel wall thickening, distention, or inflammatory changes. Vascular/Lymphatic: Scattered aortoiliac atherosclerotic calcifications without aneurysm. Enlarged bilateral inguinal and external iliac chain lymph nodes with the largest node in the left external iliac chain measuring 2.0 cm short axis (series 2, image 86). No abdominal lymphadenopathy. Reproductive: Prostate is unremarkable. Other: No free  fluid. No abdominopelvic fluid collection. No pneumoperitoneum. Tiny fat containing paraumbilical hernia. Musculoskeletal: There is prominent skin thickening of the posterior back and gluteal region. Small probable thick-walled collection in the region of the inferior gluteal cleft measuring up to 1.9 cm (series 2, image 84). This area closely approximates the sacrococcygeal junction. No definite bony destruction is identified to suggest osteomyelitis. A more ill-defined area of increased density in the soft tissues along the superior aspect of the gluteal cleft appears similar in appearance to the prior CT from 2021. Additionally there are sites of increased density and stranding within the subcutaneous soft tissues of the upper left gluteal region (series 2, image 63), left flank (series 2, image 42) and lower right back (series 2, image 50), all of which were present on the prior CT lumbar spine from 12/06/2019. No soft tissue gas. Subtly sclerotic appearance of the T11 vertebral body without fracture or well-defined bone lesion. Findings could represent sequela of prior trauma, or possibly a bone lesion such as a intraosseous hemangioma. IMPRESSION: 1. Small probable thick-walled collection in the region of the inferior gluteal cleft measuring up to 1.9 cm may reflect a small phlegmon or abscess. This area closely approximates the sacrococcygeal junction. No definite bony destruction is identified to suggest osteomyelitis. 2. Enlarged bilateral inguinal and external iliac chain lymph nodes with the largest node in the left external iliac chain measuring up to 2.0 cm short axis. Findings are likely reactive. 3. Multiple sites of increased density and stranding within the subcutaneous soft tissues of the upper left gluteal  region, left flank, and lower right back. These findings were all present on the prior CT from November of 2021 and may reflect sequela of prior trauma or infection. 4. Cholelithiasis without  evidence of acute cholecystitis. 5. Subtly sclerotic appearance of the T11 vertebral body without fracture or well-defined bone lesion. This is unchanged from the prior CT and felt to most likely represent sequela of prior trauma, or possibly a bone lesion such as a intraosseous hemangioma. 6. 3 mm site of ground-glass nodularity in the subpleural aspect of the right lower lobe, likely infectious or inflammatory in etiology. 7. Hepatic steatosis. Aortic Atherosclerosis (ICD10-I70.0). Electronically Signed   By: Duanne Guess D.O.   On: 10/14/2020 18:18    Procedures Procedures   Medications Ordered in ED Medications  lactated ringers infusion ( Intravenous New Bag/Given 10/15/20 0326)  morphine 2 MG/ML injection 2 mg (has no administration in time range)  HYDROmorphone (DILAUDID) injection 0.5 mg (0.5 mg Intravenous Given 10/15/20 0436)  cefTRIAXone (ROCEPHIN) 2 g in sodium chloride 0.9 % 100 mL IVPB (has no administration in time range)  insulin glargine-yfgn (SEMGLEE) injection 25 Units (25 Units Subcutaneous Given 10/14/20 2209)  insulin aspart (novoLOG) injection 0-20 Units (has no administration in time range)  vancomycin (VANCOREADY) IVPB 1750 mg/350 mL (has no administration in time range)  atorvastatin (LIPITOR) tablet 80 mg (has no administration in time range)  carvedilol (COREG) tablet 12.5 mg (has no administration in time range)  lisinopril (ZESTRIL) tablet 20 mg (has no administration in time range)  prazosin (MINIPRESS) capsule 2 mg (2 mg Oral Given 10/15/20 0025)  ARIPiprazole (ABILIFY) tablet 10 mg (has no administration in time range)  buPROPion (WELLBUTRIN XL) 24 hr tablet 300 mg (has no administration in time range)  busPIRone (BUSPAR) tablet 15 mg (15 mg Oral Not Given 10/15/20 0122)  escitalopram (LEXAPRO) tablet 20 mg (has no administration in time range)  traZODone (DESYREL) tablet 50 mg (has no administration in time range)  hydrOXYzine (ATARAX/VISTARIL) tablet 25-50 mg  (has no administration in time range)  ondansetron (ZOFRAN) injection 4 mg (4 mg Intravenous Given 10/15/20 0436)  lactated ringers bolus 2,000 mL (0 mLs Intravenous Stopped 10/14/20 1625)  cefTRIAXone (ROCEPHIN) 2 g in sodium chloride 0.9 % 100 mL IVPB (0 g Intravenous Stopped 10/14/20 1546)  HYDROmorphone (DILAUDID) injection 1 mg (1 mg Intravenous Given 10/14/20 1509)  ondansetron (ZOFRAN) injection 4 mg (4 mg Intravenous Given 10/14/20 1505)  vancomycin (VANCOREADY) IVPB 2000 mg/400 mL (0 mg Intravenous Stopped 10/14/20 1858)  HYDROmorphone (DILAUDID) injection 1 mg (1 mg Intravenous Given 10/14/20 1651)  iohexol (OMNIPAQUE) 350 MG/ML injection 80 mL (80 mLs Intravenous Contrast Given 10/14/20 1731)  HYDROmorphone (DILAUDID) injection 0.5 mg (0.5 mg Intravenous Given 10/14/20 2011)    ED Course  I have reviewed the triage vital signs and the nursing notes.  Pertinent labs & imaging results that were available during my care of the patient were reviewed by me and considered in my medical decision making (see chart for details).    MDM Rules/Calculators/A&P                            60 year old male with a history of hypertension, OSA, insulin-dependent type 2 diabetes, CKD stage III, history of recurrent abscesses, pilonidal cyst with history of surgery and surgical drainage, AKA, who presents with concern for left buttock abscess.  Fever at home, tachycardia on arrival, and concern for sepsis secondary to infection.  Placed orders for empirc abx to cover purulent cellulitis per protocol with vanc/rocephin, given 2L LR. Leukocytosis on labs, others pending at this time as well as CT abd/pelvis to evaluate extension of infection. Anticipate admission, care signed out to Dr. Lockie Mola with labs and CT pending.  Final Clinical Impression(s) / ED Diagnoses Final diagnoses:  Cellulitis of buttock  Sepsis, due to unspecified organism, unspecified whether acute organ dysfunction present [facility])    Rx  / DC Orders ED Discharge Orders     None        Alvira Monday, MD 10/15/20 [phone]

## 2020-10-16 ENCOUNTER — Inpatient Hospital Stay (HOSPITAL_COMMUNITY): Payer: No Typology Code available for payment source | Admitting: Registered Nurse

## 2020-10-16 ENCOUNTER — Encounter (HOSPITAL_COMMUNITY)
Admission: EM | Disposition: A | Discharge status: Home Health | Payer: Self-pay | Source: Home / Self Care | Attending: Internal Medicine

## 2020-10-16 DIAGNOSIS — L0231 Cutaneous abscess of buttock: Secondary | ICD-10-CM

## 2020-10-16 DIAGNOSIS — L03317 Cellulitis of buttock: Secondary | ICD-10-CM

## 2020-10-16 HISTORY — PX: INCISION AND DRAINAGE ABSCESS: SHX5864

## 2020-10-16 LAB — CBC WITH DIFFERENTIAL/PLATELET
Abs Immature Granulocytes: 0.22 10*3/uL — ABNORMAL HIGH (ref 0.00–0.07)
Basophils Absolute: 0 10*3/uL (ref 0.0–0.1)
Basophils Relative: 0 %
Eosinophils Absolute: 0.2 10*3/uL (ref 0.0–0.5)
Eosinophils Relative: 1 %
HCT: 28.9 % — ABNORMAL LOW (ref 39.0–52.0)
Hemoglobin: 9.4 g/dL — ABNORMAL LOW (ref 13.0–17.0)
Immature Granulocytes: 1 %
Lymphocytes Relative: 7 %
Lymphs Abs: 1.3 10*3/uL (ref 0.7–4.0)
MCH: 27.6 pg (ref 26.0–34.0)
MCHC: 32.5 g/dL (ref 30.0–36.0)
MCV: 84.8 fL (ref 80.0–100.0)
Monocytes Absolute: 1.6 10*3/uL — ABNORMAL HIGH (ref 0.1–1.0)
Monocytes Relative: 9 %
Neutro Abs: 14.9 10*3/uL — ABNORMAL HIGH (ref 1.7–7.7)
Neutrophils Relative %: 82 %
Platelets: 202 10*3/uL (ref 150–400)
RBC: 3.41 MIL/uL — ABNORMAL LOW (ref 4.22–5.81)
RDW: 13.7 % (ref 11.5–15.5)
WBC: 18.2 10*3/uL — ABNORMAL HIGH (ref 4.0–10.5)
nRBC: 0 % (ref 0.0–0.2)

## 2020-10-16 LAB — GLUCOSE, CAPILLARY
Glucose-Capillary: 128 mg/dL — ABNORMAL HIGH (ref 70–99)
Glucose-Capillary: 210 mg/dL — ABNORMAL HIGH (ref 70–99)
Glucose-Capillary: 241 mg/dL — ABNORMAL HIGH (ref 70–99)
Glucose-Capillary: 331 mg/dL — ABNORMAL HIGH (ref 70–99)
Glucose-Capillary: 228 mg/dL — ABNORMAL HIGH (ref 70–99)

## 2020-10-16 LAB — BASIC METABOLIC PANEL
Anion gap: 9 (ref 5–15)
BUN: 29 mg/dL — ABNORMAL HIGH (ref 6–20)
CO2: 24 mmol/L (ref 22–32)
Calcium: 8.4 mg/dL — ABNORMAL LOW (ref 8.9–10.3)
Chloride: 100 mmol/L (ref 98–111)
Creatinine, Ser: 1.38 mg/dL — ABNORMAL HIGH (ref 0.61–1.24)
GFR, Estimated: 59 mL/min — ABNORMAL LOW (ref >60)
Glucose, Bld: 224 mg/dL — ABNORMAL HIGH (ref 70–99)
Potassium: 3.7 mmol/L (ref 3.5–5.1)
Sodium: 133 mmol/L — ABNORMAL LOW (ref 135–145)

## 2020-10-16 SURGERY — INCISION AND DRAINAGE, ABSCESS
Anesthesia: General | Laterality: Left

## 2020-10-16 MED ORDER — FENTANYL CITRATE (PF) 100 MCG/2ML IJ SOLN
INTRAMUSCULAR | Status: DC | PRN
  Administered 2020-10-16: 100 ug via INTRAVENOUS
  Administered 2020-10-16 (×3): 50 ug via INTRAVENOUS

## 2020-10-16 MED ORDER — PROPOFOL 10 MG/ML IV BOLUS
INTRAVENOUS | Status: AC
  Filled 2020-10-16: qty 20

## 2020-10-16 MED ORDER — 0.9 % SODIUM CHLORIDE (POUR BTL) OPTIME
TOPICAL | Status: DC | PRN
  Administered 2020-10-16: 1000 mL

## 2020-10-16 MED ORDER — ONDANSETRON HCL 4 MG/2ML IJ SOLN
INTRAMUSCULAR | Status: DC | PRN
  Administered 2020-10-16: 4 mg via INTRAVENOUS

## 2020-10-16 MED ORDER — LIDOCAINE HCL (PF) 2 % IJ SOLN
INTRAMUSCULAR | Status: AC
  Filled 2020-10-16: qty 5

## 2020-10-16 MED ORDER — INSULIN ASPART 100 UNIT/ML IJ SOLN
INTRAMUSCULAR | Status: AC
  Filled 2020-10-16: qty 1

## 2020-10-16 MED ORDER — ONDANSETRON HCL 4 MG/2ML IJ SOLN: 4.0000 mg | Freq: Four times a day (QID) | INTRAMUSCULAR | Status: DC | PRN

## 2020-10-16 MED ORDER — LABETALOL HCL 5 MG/ML IV SOLN
INTRAVENOUS | Status: AC
  Filled 2020-10-16: qty 4

## 2020-10-16 MED ORDER — ROCURONIUM BROMIDE 10 MG/ML (PF) SYRINGE
PREFILLED_SYRINGE | INTRAVENOUS | Status: AC
  Filled 2020-10-16: qty 10

## 2020-10-16 MED ORDER — PHENYLEPHRINE HCL (PRESSORS) 10 MG/ML IV SOLN
INTRAVENOUS | Status: DC | PRN
  Administered 2020-10-16 (×2): 80 ug via INTRAVENOUS

## 2020-10-16 MED ORDER — PHENYLEPHRINE 40 MCG/ML (10ML) SYRINGE FOR IV PUSH (FOR BLOOD PRESSURE SUPPORT)
PREFILLED_SYRINGE | INTRAVENOUS | Status: AC
  Filled 2020-10-16: qty 10

## 2020-10-16 MED ORDER — MIDAZOLAM HCL 2 MG/2ML IJ SOLN
INTRAMUSCULAR | Status: AC
  Filled 2020-10-16: qty 2

## 2020-10-16 MED ORDER — ROCURONIUM BROMIDE 10 MG/ML (PF) SYRINGE
PREFILLED_SYRINGE | INTRAVENOUS | Status: DC | PRN
  Administered 2020-10-16: 50 mg via INTRAVENOUS

## 2020-10-16 MED ORDER — PROPOFOL 10 MG/ML IV BOLUS
INTRAVENOUS | Status: DC | PRN
  Administered 2020-10-16: 200 mg via INTRAVENOUS

## 2020-10-16 MED ORDER — FENTANYL CITRATE PF 50 MCG/ML IJ SOSY: 25.0000 ug | PREFILLED_SYRINGE | INTRAMUSCULAR | Status: DC | PRN

## 2020-10-16 MED ORDER — ONDANSETRON HCL 4 MG/2ML IJ SOLN
INTRAMUSCULAR | Status: AC
  Filled 2020-10-16: qty 2

## 2020-10-16 MED ORDER — LABETALOL HCL 5 MG/ML IV SOLN
INTRAVENOUS | Status: DC | PRN
  Administered 2020-10-16: 5 mg via INTRAVENOUS

## 2020-10-16 MED ORDER — OXYCODONE HCL 5 MG/5ML PO SOLN: 5.0000 mg | Freq: Once | ORAL | Status: DC | PRN

## 2020-10-16 MED ORDER — MIDAZOLAM HCL 5 MG/5ML IJ SOLN
INTRAMUSCULAR | Status: DC | PRN
  Administered 2020-10-16: 2 mg via INTRAVENOUS

## 2020-10-16 MED ORDER — FENTANYL CITRATE (PF) 250 MCG/5ML IJ SOLN
INTRAMUSCULAR | Status: AC
  Filled 2020-10-16: qty 5

## 2020-10-16 MED ORDER — INSULIN ASPART 100 UNIT/ML IJ SOLN
7.0000 [IU] | Freq: Once | INTRAMUSCULAR | Status: AC
  Administered 2020-10-16: 7 [IU] via SUBCUTANEOUS

## 2020-10-16 MED ORDER — LACTATED RINGERS IV SOLN: INTRAVENOUS | Status: DC | PRN

## 2020-10-16 MED ORDER — SUGAMMADEX SODIUM 500 MG/5ML IV SOLN
INTRAVENOUS | Status: AC
  Filled 2020-10-16: qty 5

## 2020-10-16 MED ORDER — OXYCODONE HCL 5 MG PO TABS: 5.0000 mg | ORAL_TABLET | Freq: Once | ORAL | Status: DC | PRN

## 2020-10-16 MED ORDER — LIDOCAINE 2% (20 MG/ML) 5 ML SYRINGE
INTRAMUSCULAR | Status: DC | PRN
  Administered 2020-10-16: 100 mg via INTRAVENOUS

## 2020-10-16 SURGICAL SUPPLY — 26 items
BAG COUNTER SPONGE SURGICOUNT (BAG) IMPLANT
BLADE SURG 15 STRL LF DISP TIS (BLADE) ×1 IMPLANT
BLADE SURG 15 STRL SS (BLADE) ×1
BNDG GAUZE ELAST 4 BULKY (GAUZE/BANDAGES/DRESSINGS) ×4 IMPLANT
DECANTER SPIKE VIAL GLASS SM (MISCELLANEOUS) IMPLANT
DRAPE LAPAROSCOPIC ABDOMINAL (DRAPES) IMPLANT
DRSG PAD ABDOMINAL 8X10 ST (GAUZE/BANDAGES/DRESSINGS) ×2 IMPLANT
ELECT REM PT RETURN 15FT ADLT (MISCELLANEOUS) ×2 IMPLANT
GAUZE SPONGE 4X4 12PLY STRL (GAUZE/BANDAGES/DRESSINGS) ×2 IMPLANT
GLOVE SURG ENC MOIS LTX SZ7.5 (GLOVE) ×2 IMPLANT
GOWN STRL REUS W/TWL LRG LVL3 (GOWN DISPOSABLE) ×4 IMPLANT
KIT BASIN OR (CUSTOM PROCEDURE TRAY) ×2 IMPLANT
KIT TURNOVER KIT A (KITS) ×2 IMPLANT
NEEDLE HYPO 25X1 1.5 SAFETY (NEEDLE) IMPLANT
NS IRRIG 1000ML POUR BTL (IV SOLUTION) ×2 IMPLANT
PENCIL SMOKE EVACUATOR (MISCELLANEOUS) ×2 IMPLANT
SPONGE T-LAP 18X18 ~~LOC~~+RFID (SPONGE) ×2 IMPLANT
SUT MNCRL AB 4-0 PS2 18 (SUTURE) IMPLANT
SUT VIC AB 3-0 SH 27 (SUTURE)
SUT VIC AB 3-0 SH 27XBRD (SUTURE) IMPLANT
SWAB COLLECTION DEVICE MRSA (MISCELLANEOUS) IMPLANT
SWAB CULTURE ESWAB REG 1ML (MISCELLANEOUS) IMPLANT
SYR BULB EAR ULCER 3OZ GRN STR (SYRINGE) ×2 IMPLANT
SYR CONTROL 10ML LL (SYRINGE) IMPLANT
TOWEL OR 17X26 10 PK STRL BLUE (TOWEL DISPOSABLE) ×2 IMPLANT
YANKAUER SUCT BULB TIP NO VENT (SUCTIONS) ×2 IMPLANT
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
## 2020-10-16 NOTE — Transfer of Care (Signed)
Immediate Anesthesia Transfer of Care Note  Patient: Mark [name]  Procedure(s) Performed: INCISION AND DRAINAGE ABSCESS (Left)  Patient Location: PACU  Anesthesia Type:General  Level of Consciousness: awake, alert , oriented and patient cooperative  Airway & Oxygen Therapy: Patient Spontanous Breathing and Patient connected to face mask oxygen  Post-op Assessment: Report given to RN, Post -op Vital signs reviewed and stable and Patient moving all extremities X 4  Post vital signs: stable  Last Vitals:  Vitals Value Taken Time  BP 155/69 10/16/20 0845  Temp 36.2 C 10/16/20 0837  Pulse 92 10/16/20 0855  Resp 2 10/16/20 0855  SpO2 100 % 10/16/20 0855  Vitals shown include unvalidated device data.  Last Pain:  Vitals:   10/16/20 0845  TempSrc:   PainSc: Asleep      Patients Stated Pain Goal: 4 (10/15/20 0806)  Complications: No notable events documented.

## 2020-10-16 NOTE — Anesthesia Procedure Notes (Signed)
Procedure Name: Intubation Date/Time: 10/16/2020 7:46 AM Performed by: Illene Silver, CRNA Pre-anesthesia Checklist: Patient identified, Emergency Drugs available, Suction available and Patient being monitored Patient Re-evaluated:Patient Re-evaluated prior to induction Oxygen Delivery Method: Circle system utilized Preoxygenation: Pre-oxygenation with 100% oxygen Induction Type: IV induction Ventilation: Mask ventilation without difficulty, Mask ventilation with difficulty and Oral airway inserted - appropriate to patient size Laryngoscope Size: Glidescope and 4 Tube type: Oral Tube size: 8.0 mm Number of attempts: 1 Airway Equipment and Method: Stylet and Oral airway Placement Confirmation: ETT inserted through vocal cords under direct vision, positive ETCO2 and breath sounds checked- equal and bilateral Secured at: 25 cm Tube secured with: Tape Dental Injury: Teeth and Oropharynx as per pre-operative assessment  Difficulty Due To: Difficulty was anticipated, Difficult Airway- due to dentition and Difficult Airway- due to reduced neck mobility Comments: Very stiff ,

## 2020-10-16 NOTE — Op Note (Signed)
10/16/2020  8:20 AM  PATIENT:  Mark [name]  60 y.o. male  PRE-OPERATIVE DIAGNOSIS:  LEFT BUTTOCK ABSCESS  POST-OPERATIVE DIAGNOSIS:  LEFT BUTTOCK ABSCESS, PILONIDAL ABSCESS  PROCEDURE:  Procedure(s): INCISION AND DRAINAGE ABSCESSES, LEFT BUTTOCK AND PILONIDAL  SURGEON:  Surgeon(s) and Role:    * Griselda Miner, MD - Primary  PHYSICIAN ASSISTANT:   ASSISTANTS: none   ANESTHESIA:   general  EBL:  50 mL   BLOOD ADMINISTERED:none  DRAINS: none   LOCAL MEDICATIONS USED:  NONE  SPECIMEN:  Source of Specimen:  cultures and wall of pilonidal abscess  DISPOSITION OF SPECIMEN:  PATHOLOGY  COUNTS:  YES  TOURNIQUET:  * No tourniquets in log *  DICTATION: .Dragon Dictation  After informed consent was obtained the patient was brought to the operating room and left in the supine position on the stretcher.  After adequate induction of general anesthesia the patient was moved into a prone position on the operating room table and all pressure points were padded.  The buttock and gluteal cleft areas were prepped with Betadine and draped in usual sterile manner.  An appropriate timeout was performed.  Attention was first turned to the left buttock abscess.  A small incision was made transversely with the electrocautery overlying the area of drainage of pus.  I was then able to bluntly probe the area until I found a large abscess cavity.  This was opened completely using the electrocautery.  Cultures were obtained.  The cavity was probed and opened until the tissue planes seem to be intact and there was no further tunneling of the abscess cavity.  Hemostasis was achieved using the Bovie electrocautery.  The cavity was then cleaned and packed with moistened normal saline Kerlix gauze.  Attention was then turned to the pilonidal area.  There was drainage from the scar tissue at the old pilonidal drainage site.  This was probed with a hemostat and a small abscess cavity was also identified at this  location.  The cavity was opened completely with the electrocautery and hemostasis was achieved also using the electrocautery.  This wound was also packed with moistened normal saline Kerlix gauze.  Sterile dressings were then applied.  The patient tolerated the procedure well.  At the end of the case all needle sponge and instrument counts were correct.  The patient was then awakened and taken to recovery in stable condition.  PLAN OF CARE: Admit to inpatient   PATIENT DISPOSITION:  PACU - hemodynamically stable.   Delay start of Pharmacological VTE agent (>24hrs) due to surgical blood loss or risk of bleeding: no

## 2020-10-16 NOTE — Anesthesia Postprocedure Evaluation (Signed)
Anesthesia Post Note  Patient: Mark [name]  Procedure(s) Performed: INCISION AND DRAINAGE ABSCESS (Left)     Patient location during evaluation: PACU Anesthesia Type: General Level of consciousness: awake and alert Pain management: pain level controlled Vital Signs Assessment: post-procedure vital signs reviewed and stable Respiratory status: spontaneous breathing, nonlabored ventilation, respiratory function stable and patient connected to nasal cannula oxygen Cardiovascular status: blood pressure returned to baseline and stable Postop Assessment: no apparent nausea or vomiting Anesthetic complications: no   No notable events documented.  Last Vitals:  Vitals:   10/16/20 0915 10/16/20 1216  BP: (!) 158/72 115/73  Pulse: 93 91  Resp: 10 16  Temp:  36.9 C  SpO2: 100% 98%    Last Pain:  Vitals:   10/16/20 1216  TempSrc: Oral  PainSc:                  [name] S

## 2020-10-16 NOTE — Anesthesia Preprocedure Evaluation (Signed)
Anesthesia Evaluation  Patient identified by MRN, date of birth, ID band Patient awake    Reviewed: Allergy & Precautions, H&P , NPO status , Patient's Chart, lab work & pertinent test results  Airway Mallampati: II   Neck ROM: full    Dental   Pulmonary former smoker,    breath sounds clear to auscultation       Cardiovascular hypertension,  Rhythm:regular Rate:Normal     Neuro/Psych PSYCHIATRIC DISORDERS Anxiety Depression    GI/Hepatic   Endo/Other  diabetes  Renal/GU Renal InsufficiencyRenal disease     Musculoskeletal   Abdominal   Peds  Hematology  (+) anemia ,   Anesthesia Other Findings   Reproductive/Obstetrics                             Anesthesia Physical Anesthesia Plan  ASA: 2  Anesthesia Plan: General   Post-op Pain Management:    Induction: Intravenous  PONV Risk Score and Plan: 2 and Ondansetron, Dexamethasone, Midazolam and Treatment may vary due to age or medical condition  Airway Management Planned: Oral ETT  Additional Equipment:   Intra-op Plan:   Post-operative Plan: Extubation in OR  Informed Consent: I have reviewed the patients History and Physical, chart, labs and discussed the procedure including the risks, benefits and alternatives for the proposed anesthesia with the patient or authorized representative who has indicated his/her understanding and acceptance.     Dental advisory given  Plan Discussed with: CRNA, Anesthesiologist and Surgeon  Anesthesia Plan Comments:         Anesthesia Quick Evaluation

## 2020-10-16 NOTE — Progress Notes (Signed)
OT Cancellation Note  Patient Details Name: Noe Goyer MRN: [id number] DOB: 1960-06-14   Cancelled Treatment:    Reason Eval/Treat Not Completed: OT screened, no needs identified, will sign off. Patient at baseline. Able to perform functional transfers and perform ADLs at modified independence. He reports he has no therapy needs. Is getting OT for his wrist at home. OT will sign off.  Konor Noren L Tereasa Yilmaz 10/16/2020, 12:15 PM

## 2020-10-17 ENCOUNTER — Encounter (HOSPITAL_COMMUNITY): Payer: Self-pay | Admitting: General Surgery

## 2020-10-17 ENCOUNTER — Ambulatory Visit (HOSPITAL_COMMUNITY): Payer: No Typology Code available for payment source | Admitting: Licensed Clinical Social Worker

## 2020-10-17 DIAGNOSIS — L03317 Cellulitis of buttock: Secondary | ICD-10-CM | POA: Diagnosis not present

## 2020-10-17 DIAGNOSIS — L0231 Cutaneous abscess of buttock: Secondary | ICD-10-CM | POA: Diagnosis not present

## 2020-10-17 LAB — CREATININE, SERUM
Creatinine, Ser: 1.02 mg/dL (ref 0.61–1.24)
GFR, Estimated: >60 mL/min (ref >60)

## 2020-10-17 LAB — CULTURE, BLOOD (ROUTINE X 2)
Culture: NO GROWTH
Culture: NO GROWTH
Special Requests: ADEQUATE

## 2020-10-17 LAB — GLUCOSE, CAPILLARY
Glucose-Capillary: 108 mg/dL — ABNORMAL HIGH (ref 70–99)
Glucose-Capillary: 177 mg/dL — ABNORMAL HIGH (ref 70–99)
Glucose-Capillary: 110 mg/dL — ABNORMAL HIGH (ref 70–99)
Glucose-Capillary: 145 mg/dL — ABNORMAL HIGH (ref 70–99)
Glucose-Capillary: 157 mg/dL — ABNORMAL HIGH (ref 70–99)

## 2020-10-17 MED ORDER — ACETAMINOPHEN 500 MG PO TABS
1000.0000 mg | ORAL_TABLET | Freq: Four times a day (QID) | ORAL | Status: DC
  Administered 2020-10-17 – 2020-10-18 (×5): 1000 mg via ORAL
  Filled 2020-10-17 (×5): qty 2

## 2020-10-17 MED ORDER — OXYCODONE HCL 5 MG PO TABS
5.0000 mg | ORAL_TABLET | ORAL | Status: DC | PRN
  Administered 2020-10-17 – 2020-10-18 (×4): 10 mg via ORAL
  Filled 2020-10-17 (×4): qty 2

## 2020-10-17 MED ORDER — MORPHINE SULFATE (PF) 2 MG/ML IV SOLN: 2.0000 mg | INTRAVENOUS | Status: DC | PRN
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
## 2020-10-17 NOTE — Plan of Care (Signed)
°  Problem: Education: °Goal: Knowledge of General Education information will improve °Description: Including pain rating scale, medication(s)/side effects and non-pharmacologic comfort measures °Outcome: Progressing °  °Problem: Coping: °Goal: Level of anxiety will decrease °Outcome: Progressing °  °Problem: Elimination: °Goal: Will not experience complications related to bowel motility °Outcome: Progressing °  °Problem: Pain Managment: °Goal: General experience of comfort will improve °Outcome: Progressing °  °Problem: Safety: °Goal: Ability to remain free from injury will improve °Outcome: Progressing °  °Problem: Skin Integrity: °Goal: Risk for impaired skin integrity will decrease °Outcome: Progressing °  °

## 2020-10-17 NOTE — TOC Progression Note (Signed)
Transition of Care [facility]) - Progression Note    Patient Details  Name: Mark [name] MRN: [id number] Date of Birth: 09-08-1960  [facility] of [facility]) CM/SW Contact  Geni Bers, RN Phone Number: 10/17/2020, 3:13 PM  Clinical Narrative:    Pt has [facility] [place] & HH[facility]. No Home Health Agency will go to pt's home daily. Pt is also active with [facility] for PCS/ admission note. TOC will continue to follow.    Expected Discharge Plan: Home w Home Health Services Barriers to Discharge: No Barriers Identified  Expected Discharge Plan and Services Expected Discharge Plan: Home w Home Health Services   Discharge Planning Services: CM Consult Post Acute Care Choice: Home Health Living arrangements for the past 2 months: Single Family Home                             HH Agency: [facility] Date [facility] Agency Contacted: 10/17/20   Representative spoke with at [facility] Agency: Pearson Grippe   Social Determinants of Health (SDOH) Interventions    Readmission Risk Interventions No flowsheet data found.

## 2020-10-17 NOTE — Progress Notes (Signed)
1 Day Post-Op  Subjective: Patient doing well today.  No new complaints.  [facility] he says, but they won't come out daily.  ROS: See above, otherwise other systems negative  Objective: Vital signs in last 24 hours: Temp:  [97.7 F (36.5 C)-98.4 F (36.9 C)] 97.7 F (36.5 C) (09/26 0600) Pulse Rate:  [82-101] 101 (09/26 0846) Resp:  [16-20] 18 (09/26 0600) BP: (115-138)/(51-73) 130/60 (09/26 0846) SpO2:  [98 %-100 %] 100 % (09/26 0600) Last BM Date: 10/17/20  Intake/Output from previous day: 09/25 0701 - 09/26 0700 In: 800 [I.V.:800] Out: 50 [Blood:50] Intake/Output this shift: No intake/output data recorded.  PE: Skin: left inferior back wound is mostly clean with a small amount of purulent drainage noted from medial aspect.  Otherwise, clean.  Pilonidal area wound is clean.  All wounds were repacked.  Lab Results:  Recent Labs    10/15/20 0347 10/16/20 0346  WBC 21.5* 18.2*  HGB 10.4* 9.4*  HCT 31.9* 28.9*  PLT 197 202   BMET Recent Labs    10/15/20 0347 10/16/20 0346 10/17/20 0334  NA 130* 133*  --   K 4.4 3.7  --   CL 94* 100  --   CO2 17* 24  --   GLUCOSE 378* 224*  --   BUN 25* 29*  --   CREATININE 1.47* 1.38* 1.02  CALCIUM 8.5* 8.4*  --    PT/INR Recent Labs    10/14/20 1447  LABPROT 15.6*  INR 1.2   CMP     Component Value Date/Time   NA 133 (L) 10/16/2020 0346   K 3.7 10/16/2020 0346   CL 100 10/16/2020 0346   CO2 24 10/16/2020 0346   GLUCOSE 224 (H) 10/16/2020 0346   BUN 29 (H) 10/16/2020 0346   CREATININE 1.02 10/17/2020 0334   CALCIUM 8.4 (L) 10/16/2020 0346   PROT 8.1 10/14/2020 1447   ALBUMIN 3.3 (L) 10/14/2020 1447   AST 12 (L) 10/14/2020 1447   ALT 15 10/14/2020 1447   ALKPHOS 117 10/14/2020 1447   BILITOT 0.6 10/14/2020 1447   GFRNONAA >60 10/17/2020 0334   GFRAA >60 12/26/2017 2204   Lipase     Component Value Date/Time   LIPASE 159 (H) 12/26/2017 2204       Studies/Results: No  results found.  Anti-infectives: Anti-infectives (From admission, onward)    Start     Dose/Rate Route Frequency Ordered Stop   10/15/20 1600  vancomycin (VANCOREADY) IVPB 1750 mg/350 mL        1,750 mg 175 mL/hr over 120 Minutes Intravenous Every 24 hours 10/14/20 1958     10/15/20 1500  cefTRIAXone (ROCEPHIN) 2 g in sodium chloride 0.9 % 100 mL IVPB        2 g 200 mL/hr over 30 Minutes Intravenous Every 24 hours 10/14/20 1924 10/21/20 1459   10/14/20 1415  vancomycin (VANCOCIN) IVPB 1000 mg/200 mL premix  Status:  Discontinued        1,000 mg 200 mL/hr over 60 Minutes Intravenous  Once 10/14/20 1406 10/14/20 1410   10/14/20 1415  cefTRIAXone (ROCEPHIN) 2 g in sodium chloride 0.9 % 100 mL IVPB        2 g 200 mL/hr over 30 Minutes Intravenous  Once 10/14/20 1406 10/14/20 1546   10/14/20 1415  vancomycin (VANCOREADY) IVPB 2000 mg/400 mL        2,000 mg 200 mL/hr over 120 Minutes Intravenous  Once 10/14/20 1412 10/14/20 1858  Assessment/Plan POD 1, s/p I&D of Left inferior back abscess/pilonidal abscess, Dr. Carolynne Edouard 9/25 -both wounds are overall clean.  Will start BID dressing changes while here but can transition to once daily at home. -patient is trying to speak to family etc to figure out how to get daily dressing changes done at home -cx with gram + cocci and MRSA + on admit.  Can still plan for augmentin for coverage when transition to oral abx. -WBC 18K yesterday, follow -surgically stable for DC when able to medically DC and dispo is straightened out. -will arrange wound check in our clinic in 3 weeks  FEN - carb mod diet VTE - heparin ID - Rocephin/vanc   LOS: 2 days    Letha Cape , [facility] 10/17/2020, 11:27 AM Please see Amion for pager number during day hours 7:00am-4:30pm or 7:00am -11:30am on weekends

## 2020-10-17 NOTE — Plan of Care (Signed)
Pt still needing pain meds often throughout the day. PA from [facility] came by this morning and changed dressings on his back. I changed the bandages again at 4:45 because of the large amount of drainage. Pt in stable condition - vitals in good range throughout the day. Pt had two BMs today and voiding with no issues. Pt states he feels better today than he did yesterday.

## 2020-10-18 ENCOUNTER — Encounter (HOSPITAL_COMMUNITY): Payer: Self-pay | Admitting: Licensed Clinical Social Worker

## 2020-10-18 DIAGNOSIS — E1159 Type 2 diabetes mellitus with other circulatory complications: Secondary | ICD-10-CM | POA: Diagnosis not present

## 2020-10-18 DIAGNOSIS — F331 Major depressive disorder, recurrent, moderate: Secondary | ICD-10-CM

## 2020-10-18 DIAGNOSIS — L0231 Cutaneous abscess of buttock: Secondary | ICD-10-CM | POA: Diagnosis not present

## 2020-10-18 DIAGNOSIS — I152 Hypertension secondary to endocrine disorders: Secondary | ICD-10-CM

## 2020-10-18 DIAGNOSIS — R739 Hyperglycemia, unspecified: Secondary | ICD-10-CM

## 2020-10-18 DIAGNOSIS — F411 Generalized anxiety disorder: Secondary | ICD-10-CM | POA: Diagnosis not present

## 2020-10-18 DIAGNOSIS — L03317 Cellulitis of buttock: Secondary | ICD-10-CM | POA: Diagnosis not present

## 2020-10-18 DIAGNOSIS — A419 Sepsis, unspecified organism: Principal | ICD-10-CM

## 2020-10-18 LAB — GLUCOSE, CAPILLARY
Glucose-Capillary: 193 mg/dL — ABNORMAL HIGH (ref 70–99)
Glucose-Capillary: 227 mg/dL — ABNORMAL HIGH (ref 70–99)
Glucose-Capillary: 107 mg/dL — ABNORMAL HIGH (ref 70–99)

## 2020-10-18 LAB — CULTURE, BLOOD (ROUTINE X 2): Special Requests: ADEQUATE

## 2020-10-18 LAB — CBC
HCT: 30.2 % — ABNORMAL LOW (ref 39.0–52.0)
Hemoglobin: 9.7 g/dL — ABNORMAL LOW (ref 13.0–17.0)
MCH: 27.5 pg (ref 26.0–34.0)
MCHC: 32.1 g/dL (ref 30.0–36.0)
MCV: 85.6 fL (ref 80.0–100.0)
Platelets: 244 10*3/uL (ref 150–400)
RBC: 3.53 MIL/uL — ABNORMAL LOW (ref 4.22–5.81)
RDW: 14.3 % (ref 11.5–15.5)
WBC: 9.7 10*3/uL (ref 4.0–10.5)
nRBC: 0 % (ref 0.0–0.2)

## 2020-10-18 LAB — BASIC METABOLIC PANEL
Anion gap: 11 (ref 5–15)
BUN: 23 mg/dL — ABNORMAL HIGH (ref 6–20)
CO2: 23 mmol/L (ref 22–32)
Calcium: 8.5 mg/dL — ABNORMAL LOW (ref 8.9–10.3)
Chloride: 107 mmol/L (ref 98–111)
Creatinine, Ser: 1.11 mg/dL (ref 0.61–1.24)
GFR, Estimated: >60 mL/min (ref >60)
Glucose, Bld: 86 mg/dL (ref 70–99)
Potassium: 3.6 mmol/L (ref 3.5–5.1)
Sodium: 141 mmol/L (ref 135–145)

## 2020-10-18 LAB — SURGICAL PATHOLOGY

## 2020-10-18 MED ORDER — AMOXICILLIN-POT CLAVULANATE 875-125 MG PO TABS: 1.0000 | ORAL_TABLET | Freq: Two times a day (BID) | ORAL | 0 refills | Status: DC

## 2020-10-18 MED ORDER — AMOXICILLIN-POT CLAVULANATE 875-125 MG PO TABS: 1.0000 | ORAL_TABLET | Freq: Two times a day (BID) | ORAL | 0 refills | Status: AC

## 2020-10-18 MED ORDER — ACETAMINOPHEN 500 MG PO TABS: 1000.0000 mg | ORAL_TABLET | Freq: Four times a day (QID) | ORAL | 0 refills | Status: DC | PRN

## 2020-10-18 MED ORDER — OXYCODONE HCL 5 MG PO TABS: 5.0000 mg | ORAL_TABLET | ORAL | 0 refills | Status: DC | PRN

## 2020-10-18 NOTE — Plan of Care (Signed)

## 2020-10-18 NOTE — Progress Notes (Signed)
Virtual Visit via Phone Note  I connected with Mark [name] on 10/06/20 at 2:00pm EST by phone-enabled telemedicine with the correct person using two identifiers.   I discussed the limitations of evaluation and management by telemedicine and the availability of in person appointments. The patient expressed understanding and agreed to proceed.  LOCATION: Patient: home Provider: home office    History of Present Illness: Pt was referred to [facility] OP therapy for PTSD, depression and anxiety by the [facility].      Observations/Objective: Patient presented for today's session on time and was alert, oriented x5, with no evidence or self-report of SI/HI or A/V H.  Patient reported ongoing compliance with medication. Clinician inquired about patient's current emotional ratings, as well as any significant changes in thoughts, feelings or behavior since previous session.  Patient reported scores of 6/10 for depression, 9/10 for anxiety, 5/10 for anger/irritability. Cln and pt explored his ratings and coping skills. Pt reports that his sores on his back have returned. "I have a fever and my sores are open and running." Cln suggested pt go to the ED to be checked out. Cln gave him information on the new ED on [address]   Assessment and Plan: Counselor will continue to meet with patient to address treatment plan goals. Patient will continue to follow recommendations of providers and implement skills learned in session.   Follow Up Instructions I discussed the assessment and treatment plan with the patient. The patient was provided an opportunity to ask questions and all were answered. The patient agreed with the plan and demonstrated an understanding of the instructions.   The patient was advised to call back or seek an in-person evaluation if the symptoms worsen or if the condition fails to improve as anticipated.  I provided 15 minutes of non-face-to-face time during this  encounter.   Brentley Horrell S, LCAS  .

## 2020-10-18 NOTE — Discharge Summary (Signed)
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 information. The expected result is BloggerCourse.comttps://www.fda.gov/media/152166/download  Fact Sheet for Healthcare Providers:  SeriousBroker.it  This test is no t yet approved or cleared by the [place] FDA and  has been authorized  for detection and/or diagnosis of SARS-CoV-2 by FDA under an Emergency Use Authorization (EUA). This EUA will remain  in effect (meaning this test can be used) for the duration of the COVID-19 declaration under Section 564(b)(1) of the Act, 21 U.S.C.section 360bbb-3(b)(1), unless the authorization is terminated  or revoked sooner.       Influenza A by PCR NEGATIVE NEGATIVE Final   Influenza B by PCR NEGATIVE NEGATIVE Final    Comment: (NOTE) The Xpert Xpress SARS-CoV-2/FLU/RSV plus assay is intended as an aid in the diagnosis of influenza from Nasopharyngeal swab specimens and should not be used as a sole basis for treatment. Nasal washings and aspirates are unacceptable for Xpert Xpress SARS-CoV-2/FLU/RSV testing.  Fact Sheet for Patients: BloggerCourse.com  Fact Sheet for Healthcare Providers: SeriousBroker.it  This test is not yet approved or cleared by the [place] FDA and has been authorized for detection and/or diagnosis of SARS-CoV-2 by FDA under an Emergency Use Authorization (EUA). This EUA will remain in effect (meaning this test can be used) for the duration of the COVID-19 declaration under Section 564(b)(1) of the Act, 21 U.S.C. section 360bbb-3(b)(1), unless the authorization is terminated or revoked.  Performed at [facility], [address] [place], [place] [address]   Blood Culture (routine x 2)     Status: Abnormal   Collection Time: 10/14/20  2:49 PM   Specimen: BLOOD  Result Value Ref Range Status   Specimen Description   Final    BLOOD BLOOD RIGHT ARM Performed at [facility], [address] [place], [place] [address]    Special Requests   Final    BOTTLES DRAWN AEROBIC AND ANAEROBIC Blood Culture adequate volume Performed at [facility], [address], [place], [place] [address]    Culture  Setup Time   Final    GRAM POSITIVE COCCI  IN CHAINS ANAEROBIC BOTTLE ONLY CRITICAL RESULT CALLED TO, READ BACK BY AND VERIFIED WITH: PHARMD MICHELLE BELL 10/15/20@22 :20 BY TW Performed at [facility], [address] [place], [place] [address]    Culture STREPTOCOCCUS ANGINOSIS (A)  Final   Report Status 10/18/2020 FINAL  Final   Organism ID, Bacteria STREPTOCOCCUS ANGINOSIS  Final      Susceptibility   Streptococcus anginosis - MIC*    PENICILLIN <=0.06 SENSITIVE Sensitive     CEFTRIAXONE <=0.12 SENSITIVE Sensitive     ERYTHROMYCIN <=0.12 SENSITIVE Sensitive     LEVOFLOXACIN <=0.25 SENSITIVE Sensitive     VANCOMYCIN 0.5 SENSITIVE Sensitive     *  STREPTOCOCCUS ANGINOSIS  Blood Culture ID Panel (Reflexed)     Status: Abnormal   Collection Time: 10/14/20  2:49 PM  Result Value Ref Range Status   Enterococcus faecalis NOT DETECTED NOT DETECTED Final   Enterococcus Faecium NOT DETECTED NOT DETECTED Final   Listeria monocytogenes NOT DETECTED NOT DETECTED Final   Staphylococcus species NOT DETECTED NOT DETECTED Final   Staphylococcus aureus (BCID) NOT DETECTED NOT DETECTED Final   Staphylococcus epidermidis NOT DETECTED NOT DETECTED Final   Staphylococcus lugdunensis NOT DETECTED NOT DETECTED Final   Streptococcus species DETECTED (A) NOT DETECTED Final    Comment: Not Enterococcus species, Streptococcus agalactiae, Streptococcus pyogenes, or Streptococcus pneumoniae. CRITICAL RESULT CALLED TO, READ BACK BY AND VERIFIED WITH: PHARMD MICHELLE BELL 10/15/20@22 :20 BY TW    Streptococcus agalactiae NOT DETECTED NOT DETECTED Final   Streptococcus pneumoniae NOT DETECTED NOT DETECTED Final   Streptococcus pyogenes NOT DETECTED NOT DETECTED Final   A.calcoaceticus-baumannii NOT DETECTED NOT DETECTED Final   Bacteroides fragilis NOT DETECTED NOT DETECTED Final   Enterobacterales NOT DETECTED NOT DETECTED Final   Enterobacter cloacae complex NOT DETECTED NOT DETECTED Final   Escherichia coli NOT DETECTED NOT DETECTED Final    Klebsiella aerogenes NOT DETECTED NOT DETECTED Final   Klebsiella oxytoca NOT DETECTED NOT DETECTED Final   Klebsiella pneumoniae NOT DETECTED NOT DETECTED Final   Proteus species NOT DETECTED NOT DETECTED Final   Salmonella species NOT DETECTED NOT DETECTED Final   Serratia marcescens NOT DETECTED NOT DETECTED Final   Haemophilus influenzae NOT DETECTED NOT DETECTED Final   Neisseria meningitidis NOT DETECTED NOT DETECTED Final   Pseudomonas aeruginosa NOT DETECTED NOT DETECTED Final   Stenotrophomonas maltophilia NOT DETECTED NOT DETECTED Final   Candida albicans NOT DETECTED NOT DETECTED Final   Candida auris NOT DETECTED NOT DETECTED Final   Candida glabrata NOT DETECTED NOT DETECTED Final   Candida krusei NOT DETECTED NOT DETECTED Final   Candida parapsilosis NOT DETECTED NOT DETECTED Final   Candida tropicalis NOT DETECTED NOT DETECTED Final   Cryptococcus neoformans/gattii NOT DETECTED NOT DETECTED Final    Comment: Performed at [facility], [address], [place], [place] [address]  Surgical pcr screen     Status: Abnormal   Collection Time: 10/15/20 12:30 PM   Specimen: Nasal Mucosa; Nasal Swab  Result Value Ref Range Status   MRSA, PCR POSITIVE (A) NEGATIVE Final    Comment: RESULT CALLED TO, READ BACK BY AND VERIFIED WITH: Priscella Mann, RN [phone] 10/15/20 KDS    Staphylococcus aureus POSITIVE (A) NEGATIVE Final    Comment: (NOTE) The Xpert SA Assay (FDA approved for NASAL specimens in patients 68 years of age and older), is one component of a comprehensive surveillance program. It is not intended to diagnose infection nor to guide or monitor treatment. Performed at [facility], [address] [place], [place] [address]   Blood Culture (routine x 2)     Status: None (Preliminary result)   Collection Time: 10/16/20  3:46 AM   Specimen: BLOOD  Result Value Ref Range Status   Specimen Description   Final    BLOOD LEFT ANTECUBITAL Performed  at [facility], [address] [place], [place] [address]    Special Requests   Final    BOTTLES DRAWN AEROBIC ONLY Blood Culture adequate volume Performed at [facility], [address] [place], [place] [address]    Culture   Final    NO GROWTH  2 DAYS Performed at [facility], [address], [place], [place] [address]    Report Status PENDING  Incomplete  Aerobic/Anaerobic Culture w Gram Stain (surgical/deep wound)     Status: None (Preliminary result)   Collection Time: 10/16/20  8:07 AM   Specimen: PATH Other; Tissue  Result Value Ref Range Status   Specimen Description   Final    BUTTOCKS LEFT Performed at [facility], [address] [place], [place] [address]    Special Requests   Final    NONE Performed at [facility], [address], [place], [place] [address]    Gram Stain   Final    FEW WBC PRESENT, PREDOMINANTLY PMN RARE GRAM POSITIVE COCCI Performed at [facility], [address], [place], [place] [address]    Culture   Final    ABUNDANT STAPHYLOCOCCUS AUREUS IDENTIFICATION TO FOLLOW NO ANAEROBES ISOLATED; CULTURE IN PROGRESS FOR 5 DAYS    Report Status PENDING  Incomplete     Time coordinating discharge: Over 30 minutes  SIGNED:   Azucena Fallen, DO [facility] 10/18/2020, 2:54 PM Pager   If 7PM-7AM, please contact night-coverage [email or web address]

## 2020-10-18 NOTE — TOC Progression Note (Addendum)
Transition of Care [facility]) - Progression Note    Patient Details  Name: Mark [name] MRN: [id number] Date of Birth: 25-Sep-1960  [facility] CM/SW Contact  Geni Bers, RN Phone Number: 10/18/2020, 1:36 PM  Clinical Narrative:    Pt [facility] and [facility] [place]. [facility] if [facility] will [facility] more [facility] for RN/PT to do dressing changes. Pt is active with [name], PCP Georgiann Cocker, NP, CSW [facility] [phone].     Expected Discharge Plan: Home w Home Health Services Barriers to Discharge: No Barriers Identified  Expected Discharge Plan and Services Expected Discharge Plan: Home w Home Health Services   Discharge Planning Services: CM Consult Post Acute Care Choice: Home Health Living arrangements for the past 2 months: Single Family Home                             HH Agency: [facility] Date [facility] Agency Contacted: 10/17/20   Representative spoke with at [facility] Agency: Pearson Grippe   Social Determinants of Health (SDOH) Interventions    Readmission Risk Interventions No flowsheet data found.

## 2020-10-18 NOTE — Discharge Instructions (Signed)
MIDLINE WOUND CARE: - midline dressing to be changed daily - supplies: sterile saline, gauze, scissors, ABD pads, tape  - remove dressing and all packing carefully, moistening with sterile saline as needed to avoid packing/internal dressing sticking to the wound. - clean edges of skin around the wound with water/gauze, making sure there is no tape debris or leakage left on skin that could cause skin irritation or breakdown. - dampen and clean gauze with sterile saline and pack wound from wound base to skin level, making sure to take note of any possible areas of wound tracking, tunneling and packing appropriately. Wound can be packed loosely.  - cover wound with a dry gauze and secure with tape.  - change dressing as needed if leakage occurs, wound gets contaminated, or patient requests to shower. - patient may shower daily with wound open and following the shower the wound should be dried and a clean dressing placed.

## 2020-10-19 NOTE — Progress Notes (Addendum)
Patient discharged home.  AVS reviewed.  Patient reported understanding.  Per pt report, the [place] pharmacy reports having not received his scripts; unable to locate them.  Dr. Natale Milch notified.  Unable to redo script for oxycodone, but will print script for augmentin.  Patient reports will f/u on scripts at the [facility] tomorrow.  Transported out via his motorized w/c.  Escorted per NT.

## 2020-10-20 ENCOUNTER — Other Ambulatory Visit: Payer: Self-pay

## 2020-10-20 ENCOUNTER — Encounter (HOSPITAL_COMMUNITY): Payer: Self-pay | Admitting: Licensed Clinical Social Worker

## 2020-10-20 ENCOUNTER — Ambulatory Visit (INDEPENDENT_AMBULATORY_CARE_PROVIDER_SITE_OTHER): Payer: No Typology Code available for payment source | Admitting: Licensed Clinical Social Worker

## 2020-10-20 DIAGNOSIS — F419 Anxiety disorder, unspecified: Secondary | ICD-10-CM

## 2020-10-20 DIAGNOSIS — F331 Major depressive disorder, recurrent, moderate: Secondary | ICD-10-CM

## 2020-10-20 NOTE — Progress Notes (Signed)
Virtual Visit via Video Note  I connected with Mark [name] on 10/06/20 at 2:00pm EST by video-enabled telemedicine with the correct person using two identifiers.   I discussed the limitations of evaluation and management by telemedicine and the availability of in person appointments. The patient expressed understanding and agreed to proceed.  LOCATION: Patient: home Provider: home office    History of Present Illness: Pt was referred to [facility] OP therapy for PTSD, depression and anxiety by the [facility].      Observations/Objective: Patient presented for today's session on time and was alert, oriented x5, with no evidence or self-report of SI/HI or A/V H.  Patient reported ongoing compliance with medication. Clinician inquired about patient's current emotional ratings, as well as any significant changes in thoughts, feelings or behavior since previous session.  Patient reported scores of 7/10 for depression, 8/10 for anxiety, 8/10 for anger/irritability. Cln and pt explored his emotional ratings and coping skills. Pt is out of the hospital after having surgery on the sores, draining them. He has nursing coming daily to change his bandages and has been prescribed hydrocodone for pain. Cln provided education on opiates.        Assessment and Plan: Counselor will continue to meet with patient to address treatment plan goals. Patient will continue to follow recommendations of providers and implement skills learned in session.   Follow Up Instructions I discussed the assessment and treatment plan with the patient. The patient was provided an opportunity to ask questions and all were answered. The patient agreed with the plan and demonstrated an understanding of the instructions.   The patient was advised to call back or seek an in-person evaluation if the symptoms worsen or if the condition fails to improve as anticipated.  I provided 15 minutes of non-face-to-face time during this  encounter.   Oneil Behney S, LCAS  .

## 2020-10-21 LAB — AEROBIC/ANAEROBIC CULTURE W GRAM STAIN (SURGICAL/DEEP WOUND)

## 2020-10-21 LAB — CULTURE, BLOOD (ROUTINE X 2)
Culture: NO GROWTH
Special Requests: ADEQUATE

## 2020-10-24 ENCOUNTER — Ambulatory Visit (INDEPENDENT_AMBULATORY_CARE_PROVIDER_SITE_OTHER): Payer: No Typology Code available for payment source | Admitting: Licensed Clinical Social Worker

## 2020-10-24 ENCOUNTER — Other Ambulatory Visit: Payer: Self-pay

## 2020-10-24 ENCOUNTER — Encounter (HOSPITAL_COMMUNITY): Payer: Self-pay | Admitting: Licensed Clinical Social Worker

## 2020-10-24 DIAGNOSIS — F4310 Post-traumatic stress disorder, unspecified: Secondary | ICD-10-CM

## 2020-10-24 DIAGNOSIS — F331 Major depressive disorder, recurrent, moderate: Secondary | ICD-10-CM

## 2020-10-24 DIAGNOSIS — F419 Anxiety disorder, unspecified: Secondary | ICD-10-CM | POA: Diagnosis not present

## 2020-10-24 NOTE — Progress Notes (Signed)
Virtual Visit via Video Note  I connected with Mark [name] on 10/24/20 at 5:00 pm EST by a video enabled telemedicine application and verified that I am speaking with the correct person using two identifiers.   I discussed the limitations of evaluation and management by telemedicine and the availability of in person appointments. The patient expressed understanding and agreed to proceed.  LOCATION: Patient: Home Provider: home Office  History of Present Illness: [facility]   Observation/Objective:   Patient participated in a discussion on challenging and modifying unhelpful beliefs related to trauma, where the patient can create a new understanding and conceptualization of the traumatic event, to help reduce ongoing negative effects on current life experiences. Patient was encouraged to challenge and modify unhelpful beliefs.  Assessment and Plan: Counselor will continue to meet with patient address treatment plan goals. Patient recommendations of providers and implement skill.     Follow-up instructions: I discussed the assessment and treatment plan with the patient. The patient was provided an opportunity to ask questions and all were answered. The patient agreed with the plan and demonstrated an understanding of the instructions.   The patient was advised to call back or seek an in-person evaluation if the symptoms worsen or if the condition fails to improve as anticipated.  I provided 75 minutes of non-face-to-face time during this encounter.   Eural Holzschuh S, LCAS

## 2020-10-27 ENCOUNTER — Encounter (HOSPITAL_COMMUNITY): Payer: Self-pay | Admitting: Licensed Clinical Social Worker

## 2020-10-27 ENCOUNTER — Other Ambulatory Visit: Payer: Self-pay

## 2020-10-27 ENCOUNTER — Ambulatory Visit (INDEPENDENT_AMBULATORY_CARE_PROVIDER_SITE_OTHER): Payer: No Typology Code available for payment source | Admitting: Licensed Clinical Social Worker

## 2020-10-27 DIAGNOSIS — F4310 Post-traumatic stress disorder, unspecified: Secondary | ICD-10-CM

## 2020-10-27 DIAGNOSIS — F419 Anxiety disorder, unspecified: Secondary | ICD-10-CM

## 2020-10-27 DIAGNOSIS — F331 Major depressive disorder, recurrent, moderate: Secondary | ICD-10-CM

## 2020-10-27 NOTE — Progress Notes (Signed)
Virtual Visit via Video Note  I connected with Mark [name] on 10/27/20 at 2:00pm EST by video-enabled telemedicine with the correct person using two identifiers.   I discussed the limitations of evaluation and management by telemedicine and the availability of in person appointments. The patient expressed understanding and agreed to proceed.  LOCATION: Patient: home Provider: home office    History of Present Illness: Pt was referred to [facility] OP therapy for PTSD, depression and anxiety by the [facility].      Observations/Objective: Patient presented for today's session on time and was alert, oriented x5, with no evidence or self-report of SI/HI or A/V H.  Patient reported ongoing compliance with medication. Clinician inquired about patient's current emotional ratings, as well as any significant changes in thoughts, feelings or behavior since previous session.  Patient reported scores of 7/10 for depression, 9/10 for anxiety, 8/10 for anger/irritability. Cln and pt explored his emotional ratings and coping skills. "I'm having a hard time taking care of  myself, bathing, cooking, transferring out of my wheelchair, anything that I have to use my hands/wrists." Clinician utilized CBT to process thoughts, feelings, and behaviors. Clinician noted the challenge in being productive, working, and feeling positive when physical health is poor. Clinician provided supportive feedback.  Assessment and Plan: Counselor will continue to meet with patient to address treatment plan goals. Patient will continue to follow recommendations of providers and implement skills learned in session.   Follow Up Instructions I discussed the assessment and treatment plan with the patient. The patient was provided an opportunity to ask questions and all were answered. The patient agreed with the plan and demonstrated an understanding of the instructions.   The patient was advised to call back or seek an in-person  evaluation if the symptoms worsen or if the condition fails to improve as anticipated.  I provided 45 minutes of non-face-to-face time during this encounter.   Jaylei Fuerte S, LCAS  .

## 2020-10-31 ENCOUNTER — Ambulatory Visit (INDEPENDENT_AMBULATORY_CARE_PROVIDER_SITE_OTHER): Payer: No Typology Code available for payment source | Admitting: Licensed Clinical Social Worker

## 2020-10-31 ENCOUNTER — Other Ambulatory Visit: Payer: Self-pay

## 2020-10-31 ENCOUNTER — Encounter (HOSPITAL_COMMUNITY): Payer: Self-pay | Admitting: Licensed Clinical Social Worker

## 2020-10-31 DIAGNOSIS — F419 Anxiety disorder, unspecified: Secondary | ICD-10-CM

## 2020-10-31 DIAGNOSIS — F331 Major depressive disorder, recurrent, moderate: Secondary | ICD-10-CM

## 2020-10-31 DIAGNOSIS — F4310 Post-traumatic stress disorder, unspecified: Secondary | ICD-10-CM

## 2020-10-31 NOTE — Progress Notes (Signed)
Virtual Visit via Video Note  I connected with Mark [name] on 10/24/20 at 5:00 pm EST by a video enabled telemedicine application and verified that I am speaking with the correct person using two identifiers.   I discussed the limitations of evaluation and management by telemedicine and the availability of in person appointments. The patient expressed understanding and agreed to proceed.  LOCATION: Patient: Home Provider: home Office  History of Present Illness: [facility], [facility]   Observation/Objective:   Patient participated in a discussion on the benefit of being vulnerable while sharing in group: Ask for what you need, be willing to expose your feelings, say what you want, express your emotions and be present. Patient was encouraged to show vulnerability while sharing in group.  Assessment and Plan: Counselor will continue to meet with patient address treatment plan goals. Patient recommendations of providers and implement skill.     Follow-up instructions: I discussed the assessment and treatment plan with the patient. The patient was provided an opportunity to ask questions and all were answered. The patient agreed with the plan and demonstrated an understanding of the instructions.   The patient was advised to call back or seek an in-person evaluation if the symptoms worsen or if the condition fails to improve as anticipated.  I provided 75 minutes of non-face-to-face time during this encounter.   Sequoia Witz S, LCAS

## 2020-11-03 ENCOUNTER — Other Ambulatory Visit: Payer: Self-pay

## 2020-11-03 ENCOUNTER — Ambulatory Visit (INDEPENDENT_AMBULATORY_CARE_PROVIDER_SITE_OTHER): Payer: No Typology Code available for payment source | Admitting: Licensed Clinical Social Worker

## 2020-11-03 ENCOUNTER — Encounter (HOSPITAL_COMMUNITY): Payer: Self-pay | Admitting: Licensed Clinical Social Worker

## 2020-11-03 DIAGNOSIS — F331 Major depressive disorder, recurrent, moderate: Secondary | ICD-10-CM

## 2020-11-03 DIAGNOSIS — F4310 Post-traumatic stress disorder, unspecified: Secondary | ICD-10-CM

## 2020-11-03 DIAGNOSIS — F419 Anxiety disorder, unspecified: Secondary | ICD-10-CM

## 2020-11-03 NOTE — Progress Notes (Signed)
Virtual Visit via Video Note  I connected with Mark [name] on 11/03/20 at 2:00pm EST by video-enabled telemedicine with the correct person using two identifiers.   I discussed the limitations of evaluation and management by telemedicine and the availability of in person appointments. The patient expressed understanding and agreed to proceed.  LOCATION: Patient: home Provider: home office    History of Present Illness: Pt was referred to [facility] OP therapy for PTSD, depression and [facility] by [facility].      Observations/Objective: Patient presented for today's session on time and was alert, oriented x5, with no evidence or self-report of SI/HI or A/V H.  Patient reported ongoing compliance with medication. Clinician inquired about patient's current emotional ratings, as well as any significant changes in thoughts, feelings or behavior since previous session.  Patient reported scores of 7/10 for depression, 9/10 for anxiety, 10/10 for anger/irritability. Cln and pt explored his emotional ratings and coping skills. "I'm continuing to have a hard time taking care of  myself, bathing, cooking, transferring out of my wheelchair, anything that I have to use my hands/wrists. [facility] so I'm having to pay my aid and nurse out of pocket. I don't even have a new pcp." Cln and pt explored his emotions about how he's being treated by the [place] Cln provided education on self-awareness which can help pt patterns in emotions, including events or situations that can trigger worsened symptoms.    Clinician utilized CBT to process thoughts, feelings, and behaviors. Clinician noted the challenge in being productive, working, and feeling positive when physical health is poor. Clinician provided supportive feedback.  Assessment and Plan: Counselor will continue to meet with patient to address treatment plan goals. Patient will continue to follow recommendations of providers  and implement skills learned in session.   Follow Up Instructions I discussed the assessment and treatment plan with the patient. The patient was provided an opportunity to ask questions and all were answered. The patient agreed with the plan and demonstrated an understanding of the instructions.   The patient was advised to call back or seek an in-person evaluation if the symptoms worsen or if the condition fails to improve as anticipated.  I provided 45 minutes of non-face-to-face time during this encounter.   Mark [name] S, LCAS  .

## 2020-11-07 ENCOUNTER — Encounter (HOSPITAL_COMMUNITY): Payer: Self-pay | Admitting: Licensed Clinical Social Worker

## 2020-11-07 ENCOUNTER — Ambulatory Visit (INDEPENDENT_AMBULATORY_CARE_PROVIDER_SITE_OTHER): Payer: No Typology Code available for payment source | Admitting: Licensed Clinical Social Worker

## 2020-11-07 ENCOUNTER — Other Ambulatory Visit: Payer: Self-pay

## 2020-11-07 DIAGNOSIS — F419 Anxiety disorder, unspecified: Secondary | ICD-10-CM | POA: Diagnosis not present

## 2020-11-07 DIAGNOSIS — F4310 Post-traumatic stress disorder, unspecified: Secondary | ICD-10-CM

## 2020-11-07 DIAGNOSIS — F331 Major depressive disorder, recurrent, moderate: Secondary | ICD-10-CM

## 2020-11-07 NOTE — Progress Notes (Signed)
Virtual Visit via Video Note  I connected with Mark [name] on 10/24/20 at 5:00 pm EST by a video enabled telemedicine application and verified that I am speaking with the correct person using two identifiers.   I discussed the limitations of evaluation and management by telemedicine and the availability of in person appointments. The patient expressed understanding and agreed to proceed.  LOCATION: Patient: Home Provider: home Office  History of Present Illness: [facility], depression, [facility].   Observation/Objective:   Patient participated in a discussion on therapies available through Occupational Therapy at the [place]. Cln suggested patients reach out to their PCP for further information to assist patient with emotional ratings. Cln suggested patient seek therapy through [facility] at the [place].                        Assessment and Plan: Counselor will continue to meet with patient address treatment plan goals. Patient recommendations of providers and implement skill.     Follow-up instructions: I discussed the assessment and treatment plan with the patient. The patient was provided an opportunity to ask questions and all were answered. The patient agreed with the plan and demonstrated an understanding of the instructions.   The patient was advised to call back or seek an in-person evaluation if the symptoms worsen or if the condition fails to improve as anticipated.  I provided 60 minutes of non-face-to-face time during this encounter.   Mazal Ebey S, LCAS

## 2020-11-10 ENCOUNTER — Encounter (HOSPITAL_COMMUNITY): Payer: Self-pay | Admitting: Licensed Clinical Social Worker

## 2020-11-10 ENCOUNTER — Ambulatory Visit (INDEPENDENT_AMBULATORY_CARE_PROVIDER_SITE_OTHER): Payer: No Typology Code available for payment source | Admitting: Licensed Clinical Social Worker

## 2020-11-10 ENCOUNTER — Other Ambulatory Visit: Payer: Self-pay

## 2020-11-10 DIAGNOSIS — F331 Major depressive disorder, recurrent, moderate: Secondary | ICD-10-CM

## 2020-11-10 DIAGNOSIS — F4310 Post-traumatic stress disorder, unspecified: Secondary | ICD-10-CM | POA: Diagnosis not present

## 2020-11-10 DIAGNOSIS — F419 Anxiety disorder, unspecified: Secondary | ICD-10-CM

## 2020-11-10 NOTE — Progress Notes (Signed)
Virtual Visit via Video Note  I connected with Mark [name] on 11/10/20 at 2:00pm EST by video-enabled telemedicine with the correct person using two identifiers.   I discussed the limitations of evaluation and management by telemedicine and the availability of in person appointments. The patient expressed understanding and agreed to proceed.  LOCATION: Patient: home Provider: home office    History of Present Illness: Pt was referred to [facility] OP therapy for PTSD, depression and anxiety by the [facility].      Observations/Objective: Patient presented for today's session on time and was alert, oriented x5, with no evidence or self-report of SI/HI or A/V H.  Patient reported ongoing compliance with medication. Clinician inquired about patient's current emotional ratings, as well as any significant changes in thoughts, feelings or behavior since previous session.  Patient reported scores of 7/10 for depression, 9/10 for anxiety, 10/10 for anger/irritability. Cln and pt explored his emotional ratings and coping skills. "I'm still extremely irritable." Cln utilized CBT to help pt identify his negative thoughts/feelings, do a reality-check, then challenge and replace them with more rational thoughts, focusing on practical skills to help him feel better. Cln suggested mindfulness-based coping skills.    Assessment and Plan: Counselor will continue to meet with patient to address treatment plan goals. Patient will continue to follow recommendations of providers and implement skills learned in session.   Follow Up Instructions I discussed the assessment and treatment plan with the patient. The patient was provided an opportunity to ask questions and all were answered. The patient agreed with the plan and demonstrated an understanding of the instructions.   The patient was advised to call back or seek an in-person evaluation if the symptoms worsen or if the condition fails to improve as  anticipated.  I provided 45 minutes of non-face-to-face time during this encounter.   Amiley Shishido S, LCAS  .

## 2020-11-14 ENCOUNTER — Encounter (HOSPITAL_COMMUNITY): Payer: Self-pay | Admitting: Licensed Clinical Social Worker

## 2020-11-14 ENCOUNTER — Ambulatory Visit (INDEPENDENT_AMBULATORY_CARE_PROVIDER_SITE_OTHER): Payer: No Typology Code available for payment source | Admitting: Licensed Clinical Social Worker

## 2020-11-14 ENCOUNTER — Other Ambulatory Visit: Payer: Self-pay

## 2020-11-14 DIAGNOSIS — F4310 Post-traumatic stress disorder, unspecified: Secondary | ICD-10-CM | POA: Diagnosis not present

## 2020-11-14 DIAGNOSIS — F419 Anxiety disorder, unspecified: Secondary | ICD-10-CM | POA: Diagnosis not present

## 2020-11-14 DIAGNOSIS — F331 Major depressive disorder, recurrent, moderate: Secondary | ICD-10-CM | POA: Diagnosis not present

## 2020-11-14 NOTE — Progress Notes (Signed)
Virtual Visit via Video Note  I connected with Mark [name] on 11/14/20 at 5:00 pm EST by a video enabled telemedicine application and verified that I am speaking with the correct person using two identifiers.   I discussed the limitations of evaluation and management by telemedicine and the availability of in person appointments. The patient expressed understanding and agreed to proceed.  LOCATION: Patient: Home Provider: home Office  History of Present Illness: [facility], depression, anxiety.   Observation/Objective:   Pt participated in a discussion on family dysfunction. Clinician assessed patient's family dynamics and discussed how the dynamics influence mental health symptoms in a negative way. Patient was encouraged to work to improve family function.                           Assessment and Plan: Counselor will continue to meet with patient address treatment plan goals. Patient recommendations of providers and implement skill.     Follow-up instructions: I discussed the assessment and treatment plan with the patient. The patient was provided an opportunity to ask questions and all were answered. The patient agreed with the plan and demonstrated an understanding of the instructions.   The patient was advised to call back or seek an in-person evaluation if the symptoms worsen or if the condition fails to improve as anticipated.  I provided 75 minutes of non-face-to-face time during this encounter.   Belford Pascucci S, LCAS

## 2020-11-17 ENCOUNTER — Ambulatory Visit (INDEPENDENT_AMBULATORY_CARE_PROVIDER_SITE_OTHER): Payer: No Typology Code available for payment source | Admitting: Licensed Clinical Social Worker

## 2020-11-17 ENCOUNTER — Other Ambulatory Visit: Payer: Self-pay

## 2020-11-17 ENCOUNTER — Encounter (HOSPITAL_COMMUNITY): Payer: Self-pay | Admitting: Licensed Clinical Social Worker

## 2020-11-17 ENCOUNTER — Ambulatory Visit (HOSPITAL_COMMUNITY): Payer: No Typology Code available for payment source | Admitting: Licensed Clinical Social Worker

## 2020-11-17 DIAGNOSIS — F4310 Post-traumatic stress disorder, unspecified: Secondary | ICD-10-CM | POA: Diagnosis not present

## 2020-11-17 DIAGNOSIS — F331 Major depressive disorder, recurrent, moderate: Secondary | ICD-10-CM | POA: Diagnosis not present

## 2020-11-17 DIAGNOSIS — F419 Anxiety disorder, unspecified: Secondary | ICD-10-CM

## 2020-11-17 NOTE — Progress Notes (Signed)
Virtual Visit via Video Note  I connected with Mark [name] on 11/17/20 at 2:00pm EST by video-enabled telemedicine with the correct person using two identifiers.   I discussed the limitations of evaluation and management by telemedicine and the availability of in person appointments. The patient expressed understanding and agreed to proceed.  LOCATION: Patient: home Provider: home office    History of Present Illness: Pt was referred to [facility] OP therapy for PTSD, depression and anxiety by the [facility].      Observations/Objective: Patient presented for today's session on time and was alert, oriented x5, with no evidence or self-report of SI/HI or A/V H.  Patient reported ongoing compliance with medication. Clinician inquired about patient's current emotional ratings, as well as any significant changes in thoughts, feelings or behavior since previous session.  Patient reported scores of 7/10 for depression, 9/10 for anxiety, 8/10 for anger/irritability. Cln and pt explored his emotional ratings and coping skills. "I'm still extremely irritable and I'm driving to [place]." Cln and pt role played possible scenarios that could occur within his family unit during the visit, using effective communication skills. Cln suggested mindfulness-based coping skills during the visit.   Assessment and Plan: Counselor will continue to meet with patient to address treatment plan goals. Patient will continue to follow recommendations of providers and implement skills learned in session.   Follow Up Instructions I discussed the assessment and treatment plan with the patient. The patient was provided an opportunity to ask questions and all were answered. The patient agreed with the plan and demonstrated an understanding of the instructions.   The patient was advised to call back or seek an in-person evaluation if the symptoms worsen or if the condition fails to improve as anticipated.  I provided 60  minutes of non-face-to-face time during this encounter.   Claudio Mondry S, LCAS  .

## 2020-11-21 ENCOUNTER — Other Ambulatory Visit: Payer: Self-pay

## 2020-11-21 ENCOUNTER — Encounter (HOSPITAL_COMMUNITY): Payer: Self-pay | Admitting: Licensed Clinical Social Worker

## 2020-11-21 ENCOUNTER — Ambulatory Visit (INDEPENDENT_AMBULATORY_CARE_PROVIDER_SITE_OTHER): Payer: No Typology Code available for payment source | Admitting: Licensed Clinical Social Worker

## 2020-11-21 DIAGNOSIS — F331 Major depressive disorder, recurrent, moderate: Secondary | ICD-10-CM

## 2020-11-21 DIAGNOSIS — F419 Anxiety disorder, unspecified: Secondary | ICD-10-CM | POA: Diagnosis not present

## 2020-11-21 DIAGNOSIS — F4310 Post-traumatic stress disorder, unspecified: Secondary | ICD-10-CM | POA: Diagnosis not present

## 2020-11-21 NOTE — Progress Notes (Signed)
Virtual Visit via Video Note  I connected with [name] on 11/21/20 at 5:00 pm EST by a video enabled telemedicine application and verified that I am speaking with the correct person using two identifiers.   I discussed the limitations of evaluation and management by telemedicine and the availability of in person appointments. The patient expressed understanding and agreed to proceed.  LOCATION: Patient: Home Provider: home Office  History of Present Illness: [facility], depression, anxiety.   Observation/Objective:   Patient participated in a discussion on overcoming life's challenges, under stress, which makes it more difficult to overcome obstacles in life. Patient was encouraged to identify life challenges and explore strategies for overcoming obstacles.                            Assessment and Plan: Counselor will continue to meet with patient address treatment plan goals. Patient recommendations of providers and implement skills learned in session.     Follow-up instructions: I discussed the assessment and treatment plan with the patient. The patient was provided an opportunity to ask questions and all were answered. The patient agreed with the plan and demonstrated an understanding of the instructions.   The patient was advised to call back or seek an in-person evaluation if the symptoms worsen or if the condition fails to improve as anticipated.  I provided 60 minutes of non-face-to-face time during this encounter.   Homer Miller S, LCAS

## 2020-11-24 ENCOUNTER — Other Ambulatory Visit: Payer: Self-pay

## 2020-11-24 ENCOUNTER — Encounter (HOSPITAL_COMMUNITY): Payer: Self-pay | Admitting: Licensed Clinical Social Worker

## 2020-11-24 ENCOUNTER — Ambulatory Visit (INDEPENDENT_AMBULATORY_CARE_PROVIDER_SITE_OTHER): Payer: No Typology Code available for payment source | Admitting: Licensed Clinical Social Worker

## 2020-11-24 DIAGNOSIS — F4310 Post-traumatic stress disorder, unspecified: Secondary | ICD-10-CM

## 2020-11-24 DIAGNOSIS — F331 Major depressive disorder, recurrent, moderate: Secondary | ICD-10-CM

## 2020-11-24 DIAGNOSIS — F419 Anxiety disorder, unspecified: Secondary | ICD-10-CM | POA: Diagnosis not present

## 2020-11-24 NOTE — Progress Notes (Signed)
Virtual Visit via Video Note  I connected with Mark [name] on 11/24/20 at 10:00am EST by video-enabled telemedicine with the correct person using two identifiers.   I discussed the limitations of evaluation and management by telemedicine and the availability of in person appointments. The patient expressed understanding and agreed to proceed.  LOCATION: Patient: home Provider: home office    History of Present Illness: Pt was referred to [facility] OP therapy for PTSD, depression and anxiety by the [facility].      Observations/Objective: Patient presented for today's session on time and was alert, oriented x5, with no evidence or self-report of SI/HI or A/V H.  Patient reported ongoing compliance with medication. Clinician inquired about patient's current emotional ratings, as well as any significant changes in thoughts, feelings or behavior since previous session.  Patient reported scores of 7/10 for depression, 9/10 for anxiety, 8/10 for anger/irritability. Cln and pt explored his emotional ratings and coping skills. "I'm extremely tired, almost exhausted. This has been going on a while." Cln and pt explored his exhaustion which corresponds with his CPAP machine breaking. Cln explored alternatives: go to the [place] for CPAP repair, go to [place] urgent care clinic for blood work. Cln and pt explored happiness: you are responsible for your own happiness. What are you willing to change to find your happiness?      Assessment and Plan: Counselor will continue to meet with patient to address treatment plan goals. Patient will continue to follow recommendations of providers and implement skills learned in session.   Follow Up Instructions I discussed the assessment and treatment plan with the patient. The patient was provided an opportunity to ask questions and all were answered. The patient agreed with the plan and demonstrated an understanding of the instructions.   The patient was advised to call  back or seek an in-person evaluation if the symptoms worsen or if the condition fails to improve as anticipated.  I provided 45 minutes of non-face-to-face time during this encounter.   Sandrika Schwinn S, LCAS  .

## 2020-11-28 ENCOUNTER — Ambulatory Visit (HOSPITAL_COMMUNITY): Payer: No Typology Code available for payment source | Admitting: Licensed Clinical Social Worker

## 2020-11-28 ENCOUNTER — Other Ambulatory Visit: Payer: Self-pay

## 2020-11-30 ENCOUNTER — Inpatient Hospital Stay (HOSPITAL_COMMUNITY)
Admission: EM | Admit: 2020-11-30 | Discharge: 2020-12-08 | DRG: 872 | Disposition: A | Discharge status: Home Health | Payer: No Typology Code available for payment source | Attending: Internal Medicine | Admitting: Internal Medicine

## 2020-11-30 ENCOUNTER — Encounter (HOSPITAL_COMMUNITY): Payer: Self-pay | Admitting: Oncology

## 2020-11-30 ENCOUNTER — Other Ambulatory Visit: Payer: Self-pay

## 2020-11-30 DIAGNOSIS — F331 Major depressive disorder, recurrent, moderate: Secondary | ICD-10-CM | POA: Diagnosis present

## 2020-11-30 DIAGNOSIS — E1165 Type 2 diabetes mellitus with hyperglycemia: Secondary | ICD-10-CM | POA: Diagnosis present

## 2020-11-30 DIAGNOSIS — L02232 Carbuncle of back [any part, except buttock]: Secondary | ICD-10-CM | POA: Diagnosis present

## 2020-11-30 DIAGNOSIS — Z79899 Other long term (current) drug therapy: Secondary | ICD-10-CM

## 2020-11-30 DIAGNOSIS — F319 Bipolar disorder, unspecified: Secondary | ICD-10-CM | POA: Diagnosis present

## 2020-11-30 DIAGNOSIS — E11649 Type 2 diabetes mellitus with hypoglycemia without coma: Secondary | ICD-10-CM | POA: Diagnosis not present

## 2020-11-30 DIAGNOSIS — Z87891 Personal history of nicotine dependence: Secondary | ICD-10-CM

## 2020-11-30 DIAGNOSIS — F4310 Post-traumatic stress disorder, unspecified: Secondary | ICD-10-CM | POA: Diagnosis present

## 2020-11-30 DIAGNOSIS — I152 Hypertension secondary to endocrine disorders: Secondary | ICD-10-CM | POA: Diagnosis present

## 2020-11-30 DIAGNOSIS — Z888 Allergy status to other drugs, medicaments and biological substances status: Secondary | ICD-10-CM

## 2020-11-30 DIAGNOSIS — E785 Hyperlipidemia, unspecified: Secondary | ICD-10-CM | POA: Diagnosis present

## 2020-11-30 DIAGNOSIS — Z8614 Personal history of Methicillin resistant Staphylococcus aureus infection: Secondary | ICD-10-CM

## 2020-11-30 DIAGNOSIS — E1122 Type 2 diabetes mellitus with diabetic chronic kidney disease: Secondary | ICD-10-CM | POA: Diagnosis present

## 2020-11-30 DIAGNOSIS — A419 Sepsis, unspecified organism: Secondary | ICD-10-CM | POA: Diagnosis not present

## 2020-11-30 DIAGNOSIS — E1169 Type 2 diabetes mellitus with other specified complication: Secondary | ICD-10-CM | POA: Diagnosis present

## 2020-11-30 DIAGNOSIS — L03312 Cellulitis of back [any part except buttock]: Secondary | ICD-10-CM | POA: Diagnosis present

## 2020-11-30 DIAGNOSIS — Z794 Long term (current) use of insulin: Secondary | ICD-10-CM

## 2020-11-30 DIAGNOSIS — Z993 Dependence on wheelchair: Secondary | ICD-10-CM

## 2020-11-30 DIAGNOSIS — Z89611 Acquired absence of right leg above knee: Secondary | ICD-10-CM

## 2020-11-30 DIAGNOSIS — G4733 Obstructive sleep apnea (adult) (pediatric): Secondary | ICD-10-CM | POA: Diagnosis present

## 2020-11-30 DIAGNOSIS — L0390 Cellulitis, unspecified: Secondary | ICD-10-CM | POA: Diagnosis not present

## 2020-11-30 DIAGNOSIS — N1831 Chronic kidney disease, stage 3a: Secondary | ICD-10-CM | POA: Diagnosis present

## 2020-11-30 DIAGNOSIS — F411 Generalized anxiety disorder: Secondary | ICD-10-CM | POA: Diagnosis present

## 2020-11-30 DIAGNOSIS — L0231 Cutaneous abscess of buttock: Secondary | ICD-10-CM | POA: Diagnosis present

## 2020-11-30 DIAGNOSIS — L02212 Cutaneous abscess of back [any part, except buttock]: Secondary | ICD-10-CM | POA: Diagnosis present

## 2020-11-30 DIAGNOSIS — E1159 Type 2 diabetes mellitus with other circulatory complications: Secondary | ICD-10-CM | POA: Diagnosis present

## 2020-11-30 DIAGNOSIS — L739 Follicular disorder, unspecified: Secondary | ICD-10-CM | POA: Diagnosis present

## 2020-11-30 DIAGNOSIS — Z20822 Contact with and (suspected) exposure to covid-19: Secondary | ICD-10-CM | POA: Diagnosis present

## 2020-11-30 LAB — CBC WITH DIFFERENTIAL/PLATELET
Abs Immature Granulocytes: 0.1 10*3/uL — ABNORMAL HIGH (ref 0.00–0.07)
Basophils Absolute: 0 10*3/uL (ref 0.0–0.1)
Basophils Relative: 0 %
Eosinophils Absolute: 0.2 10*3/uL (ref 0.0–0.5)
Eosinophils Relative: 1 %
HCT: 35.4 % — ABNORMAL LOW (ref 39.0–52.0)
Hemoglobin: 11.5 g/dL — ABNORMAL LOW (ref 13.0–17.0)
Immature Granulocytes: 1 %
Lymphocytes Relative: 9 %
Lymphs Abs: 1.5 10*3/uL (ref 0.7–4.0)
MCH: 27.7 pg (ref 26.0–34.0)
MCHC: 32.5 g/dL (ref 30.0–36.0)
MCV: 85.3 fL (ref 80.0–100.0)
Monocytes Absolute: 1.2 10*3/uL — ABNORMAL HIGH (ref 0.1–1.0)
Monocytes Relative: 7 %
Neutro Abs: 14.4 10*3/uL — ABNORMAL HIGH (ref 1.7–7.7)
Neutrophils Relative %: 82 %
Platelets: 210 10*3/uL (ref 150–400)
RBC: 4.15 MIL/uL — ABNORMAL LOW (ref 4.22–5.81)
RDW: 14.3 % (ref 11.5–15.5)
WBC: 17.5 10*3/uL — ABNORMAL HIGH (ref 4.0–10.5)
nRBC: 0 % (ref 0.0–0.2)

## 2020-11-30 LAB — BASIC METABOLIC PANEL
Anion gap: 9 (ref 5–15)
BUN: 20 mg/dL (ref 6–20)
CO2: 23 mmol/L (ref 22–32)
Calcium: 8.7 mg/dL — ABNORMAL LOW (ref 8.9–10.3)
Chloride: 98 mmol/L (ref 98–111)
Creatinine, Ser: 1.26 mg/dL — ABNORMAL HIGH (ref 0.61–1.24)
GFR, Estimated: >60 mL/min (ref >60)
Glucose, Bld: 326 mg/dL — ABNORMAL HIGH (ref 70–99)
Potassium: 4.2 mmol/L (ref 3.5–5.1)
Sodium: 130 mmol/L — ABNORMAL LOW (ref 135–145)

## 2020-11-30 LAB — RESP PANEL BY RT-PCR (FLU A&B, COVID) ARPGX2
Influenza A by PCR: NEGATIVE
Influenza B by PCR: NEGATIVE
SARS Coronavirus 2 by RT PCR: NEGATIVE

## 2020-11-30 LAB — LACTIC ACID, PLASMA
Lactic Acid, Venous: 1.9 mmol/L (ref 0.5–1.9)
Lactic Acid, Venous: 1.3 mmol/L (ref 0.5–1.9)

## 2020-11-30 LAB — PROCALCITONIN: Procalcitonin: 0.36 ng/mL

## 2020-11-30 LAB — GLUCOSE, CAPILLARY: Glucose-Capillary: 358 mg/dL — ABNORMAL HIGH (ref 70–99)

## 2020-11-30 MED ORDER — HYDROXYZINE HCL 25 MG PO TABS
25.0000 mg | ORAL_TABLET | Freq: Every day | ORAL | Status: DC | PRN
  Administered 2020-11-30: 50 mg via ORAL
  Filled 2020-11-30: qty 2

## 2020-11-30 MED ORDER — ACETAMINOPHEN 325 MG PO TABS
650.0000 mg | ORAL_TABLET | Freq: Four times a day (QID) | ORAL | Status: DC | PRN
  Filled 2020-11-30: qty 2

## 2020-11-30 MED ORDER — ACETAMINOPHEN 650 MG RE SUPP: 650.0000 mg | Freq: Four times a day (QID) | RECTAL | Status: DC | PRN

## 2020-11-30 MED ORDER — INSULIN ASPART 100 UNIT/ML IJ SOLN
35.0000 [IU] | Freq: Three times a day (TID) | INTRAMUSCULAR | Status: DC
  Administered 2020-12-01: 35 [IU] via SUBCUTANEOUS
  Filled 2020-11-30: qty 0.35

## 2020-11-30 MED ORDER — MORPHINE SULFATE (PF) 2 MG/ML IV SOLN
2.0000 mg | INTRAVENOUS | Status: DC | PRN
  Administered 2020-11-30: 2 mg via INTRAVENOUS
  Filled 2020-11-30: qty 1

## 2020-11-30 MED ORDER — ACETAMINOPHEN 325 MG PO TABS
650.0000 mg | ORAL_TABLET | Freq: Once | ORAL | Status: AC
  Administered 2020-11-30: 650 mg via ORAL
  Filled 2020-11-30: qty 2

## 2020-11-30 MED ORDER — ENOXAPARIN SODIUM 40 MG/0.4ML IJ SOSY
40.0000 mg | PREFILLED_SYRINGE | INTRAMUSCULAR | Status: DC
  Administered 2020-11-30 – 2020-12-07 (×8): 40 mg via SUBCUTANEOUS
  Filled 2020-11-30 (×8): qty 0.4

## 2020-11-30 MED ORDER — OXYCODONE HCL 5 MG PO TABS
5.0000 mg | ORAL_TABLET | ORAL | Status: DC | PRN
  Administered 2020-11-30 – 2020-12-01 (×2): 5 mg via ORAL
  Filled 2020-11-30 (×3): qty 1

## 2020-11-30 MED ORDER — INSULIN GLARGINE-YFGN 100 UNIT/ML ~~LOC~~ SOLN
25.0000 [IU] | Freq: Two times a day (BID) | SUBCUTANEOUS | Status: DC
Start: 2020-11-30 — End: 2020-11-30

## 2020-11-30 MED ORDER — INSULIN GLARGINE-YFGN 100 UNIT/ML ~~LOC~~ SOLN
25.0000 [IU] | Freq: Every day | SUBCUTANEOUS | Status: DC
  Administered 2020-11-30 – 2020-12-02 (×3): 25 [IU] via SUBCUTANEOUS
  Filled 2020-11-30 (×3): qty 0.25

## 2020-11-30 MED ORDER — INSULIN GLARGINE-YFGN 100 UNIT/ML ~~LOC~~ SOLN
50.0000 [IU] | Freq: Every morning | SUBCUTANEOUS | Status: DC
  Administered 2020-12-01 – 2020-12-03 (×3): 50 [IU] via SUBCUTANEOUS
  Filled 2020-11-30 (×3): qty 0.5

## 2020-11-30 MED ORDER — TRAZODONE HCL 50 MG PO TABS
50.0000 mg | ORAL_TABLET | Freq: Every evening | ORAL | Status: DC | PRN
  Administered 2020-11-30 – 2020-12-07 (×2): 50 mg via ORAL
  Filled 2020-11-30 (×2): qty 1

## 2020-11-30 MED ORDER — HYDROXYZINE PAMOATE 25 MG PO CAPS
25.0000 mg | ORAL_CAPSULE | Freq: Every day | ORAL | Status: DC | PRN
Start: 2020-11-30 — End: 2020-11-30

## 2020-11-30 MED ORDER — VANCOMYCIN HCL 2000 MG/400ML IV SOLN
2000.0000 mg | Freq: Once | INTRAVENOUS | Status: AC
  Administered 2020-11-30: 2000 mg via INTRAVENOUS
  Filled 2020-11-30: qty 400

## 2020-11-30 MED ORDER — BUPROPION HCL ER (XL) 300 MG PO TB24
300.0000 mg | ORAL_TABLET | Freq: Every day | ORAL | Status: DC
  Administered 2020-12-01 – 2020-12-08 (×8): 300 mg via ORAL
  Filled 2020-11-30 (×8): qty 1

## 2020-11-30 MED ORDER — ARIPIPRAZOLE 10 MG PO TABS
10.0000 mg | ORAL_TABLET | Freq: Every day | ORAL | Status: DC
  Administered 2020-12-01 – 2020-12-08 (×8): 10 mg via ORAL
  Filled 2020-11-30 (×8): qty 1

## 2020-11-30 MED ORDER — INSULIN ASPART 100 UNIT/ML IJ SOLN
0.0000 [IU] | Freq: Every day | INTRAMUSCULAR | Status: DC
  Administered 2020-11-30: 5 [IU] via SUBCUTANEOUS
  Administered 2020-12-03: 2 [IU] via SUBCUTANEOUS
  Administered 2020-12-05: 3 [IU] via SUBCUTANEOUS
  Administered 2020-12-07: 2 [IU] via SUBCUTANEOUS
  Filled 2020-11-30: qty 0.05

## 2020-11-30 MED ORDER — ONDANSETRON HCL 4 MG/2ML IJ SOLN
4.0000 mg | Freq: Four times a day (QID) | INTRAMUSCULAR | Status: DC | PRN
  Administered 2020-12-01: 4 mg via INTRAVENOUS
  Filled 2020-11-30: qty 2

## 2020-11-30 MED ORDER — ONDANSETRON HCL 4 MG PO TABS: 4.0000 mg | ORAL_TABLET | Freq: Four times a day (QID) | ORAL | Status: DC | PRN

## 2020-11-30 MED ORDER — BUSPIRONE HCL 5 MG PO TABS
15.0000 mg | ORAL_TABLET | Freq: Three times a day (TID) | ORAL | Status: DC
  Administered 2020-11-30 – 2020-12-08 (×23): 15 mg via ORAL
  Filled 2020-11-30 (×23): qty 1

## 2020-11-30 MED ORDER — ESCITALOPRAM OXALATE 20 MG PO TABS
20.0000 mg | ORAL_TABLET | Freq: Every day | ORAL | Status: DC
  Administered 2020-11-30 – 2020-12-08 (×9): 20 mg via ORAL
  Filled 2020-11-30 (×9): qty 1

## 2020-11-30 MED ORDER — SODIUM CHLORIDE 0.9 % IV BOLUS
1000.0000 mL | Freq: Once | INTRAVENOUS | Status: AC
  Administered 2020-11-30: 1000 mL via INTRAVENOUS

## 2020-11-30 MED ORDER — MORPHINE SULFATE (PF) 4 MG/ML IV SOLN
4.0000 mg | Freq: Once | INTRAVENOUS | Status: AC
Start: 2020-11-30 — End: 2020-11-30
  Administered 2020-11-30: 4 mg via INTRAVENOUS
  Filled 2020-11-30: qty 1

## 2020-11-30 MED ORDER — CARVEDILOL 25 MG PO TABS
25.0000 mg | ORAL_TABLET | Freq: Two times a day (BID) | ORAL | Status: DC
  Administered 2020-11-30 – 2020-12-08 (×16): 25 mg via ORAL
  Filled 2020-11-30 (×16): qty 1

## 2020-11-30 MED ORDER — ATORVASTATIN CALCIUM 40 MG PO TABS
80.0000 mg | ORAL_TABLET | Freq: Every day | ORAL | Status: DC
  Administered 2020-12-01 – 2020-12-08 (×8): 80 mg via ORAL
  Filled 2020-11-30 (×9): qty 2

## 2020-11-30 MED ORDER — LISINOPRIL 20 MG PO TABS
40.0000 mg | ORAL_TABLET | Freq: Every day | ORAL | Status: DC
  Administered 2020-11-30 – 2020-12-08 (×8): 40 mg via ORAL
  Filled 2020-11-30 (×9): qty 2

## 2020-11-30 MED ORDER — VANCOMYCIN HCL IN DEXTROSE 1-5 GM/200ML-% IV SOLN
1000.0000 mg | Freq: Two times a day (BID) | INTRAVENOUS | Status: DC
  Administered 2020-12-01 – 2020-12-02 (×3): 1000 mg via INTRAVENOUS
  Filled 2020-11-30 (×4): qty 200

## 2020-11-30 NOTE — Progress Notes (Signed)
A consult was received from an ED physician for Vancomycin per pharmacy dosing.  The patient's profile has been reviewed for ht/wt/allergies/indication/available labs.    A one time order has been placed for Vancomycin 2g IV.  Further antibiotics/pharmacy consults should be ordered by admitting physician if indicated.                       Thank you, Jamse Mead 11/30/2020  6:06 PM

## 2020-11-30 NOTE — ED Notes (Signed)
Pt wanting to be transported in personal power wheelchair. This RN and transport okay with this.

## 2020-11-30 NOTE — ED Provider Notes (Signed)
[facility]-EMERGENCY DEPT Provider Note   CSN: [id number] Arrival date & time: 11/30/20  1455     History Chief Complaint  Patient presents with   Rash    Mark [name] is a 60 y.o. male.  Patient with history of diabetes, multiple abscesses, presents with rash and lesion to lower back with surrounding redness and pain.  Symptoms ongoing for 3 days.  Denies fevers or cough or vomiting or diarrhea.  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      Past Medical History:  Diagnosis Date   Above knee amputation status, right    Anxiety    Depression    Diabetes mellitus, type II (HCC)    Hyperlipemia    Hypertension    PTSD (post-traumatic stress disorder)    Shoulder pain     Patient Active Problem List   Diagnosis Date Noted   Sepsis due to cellulitis (HCC) 11/30/2020   Cellulitis and abscess of buttock 10/14/2020   Sepsis (HCC) 10/14/2020   Hyperglycemia 10/14/2020   Abscess    Pressure injury of skin 12/08/2019   Diabetic ketoacidosis associated with type 2 diabetes mellitus (HCC) 12/04/2019   AKI (acute kidney injury) (HCC) 12/04/2019   Depression with anxiety 12/04/2019   Hypertension associated with diabetes (HCC) 12/04/2019   Hyperlipidemia associated with type 2 diabetes mellitus (HCC) 12/04/2019   Cellulitis of left lower extremity 12/04/2019   Cellulitis of lower back 12/04/2019   PTSD (post-traumatic stress disorder) 04/15/2017   Major depressive disorder, recurrent episode, moderate (HCC) 04/30/2016   Generalized anxiety disorder 04/30/2016    Past Surgical History:  Procedure Laterality Date   HERNIA REPAIR     INCISION AND DRAINAGE Left 12/11/2019   Procedure: INCISION AND DRAINAGE POSTERIOR LEFT THIGH ABSCESS;  Surgeon: Axel Filler, MD;  Location: [facility] OR;  Service: General;  Laterality: Left;   INCISION AND DRAINAGE ABSCESS Left 12/11/2019   Procedure: INCISION AND DRAINAGE BACK  ABSCESS,;  Surgeon: Axel Filler, MD;  Location: [facility] OR;  Service: General;  Laterality: Left;   INCISION AND DRAINAGE ABSCESS Left 10/16/2020   Procedure: INCISION AND DRAINAGE ABSCESS;  Surgeon: Griselda Miner, MD;  Location: [facility] ORS;  Service: General;  Laterality: Left;   LEG AMPUTATION Right    parenatal cyst         Family History  Problem Relation Age of Onset   Aneurysm Mother    Leukemia Father    Dementia Maternal Aunt    Seizures Paternal Uncle     Social History   Tobacco Use   Smoking status: Former    Types: Cigarettes    Quit date: 08/22/2019    Years since quitting: 1.2   Smokeless tobacco: Never  Vaping Use   Vaping Use: Never used  Substance Use Topics   Alcohol use: Yes    Alcohol/week: 4.0 standard drinks    Types: 2 Cans of beer, 2 Shots of liquor per week    Comment: Reports light drinking weekly.   Drug use: Yes    Frequency: 14.0 times per week    Types: Marijuana    Comment: Uses 1-2 times a day per report for his anxiety    Home Medications Prior to Admission medications   Medication Sig Start Date End Date Taking? Authorizing Provider  acetaminophen (TYLENOL) 500 MG tablet Take 2 tablets (1,000 mg total) by mouth every 6 (six) hours as needed. 10/18/20   Barnetta Chapel, PA-C  ARIPiprazole (ABILIFY) 10 MG tablet Take 10 mg by mouth daily.    [provider]  atorvastatin (LIPITOR) 80 MG tablet Take 80 mg by mouth daily.      buPROPion (WELLBUTRIN XL) 300 MG 24 hr tablet Take 1 tablet (300 mg total) by mouth daily. 08/19/19   Arfeen, Phillips Grout, MD  busPIRone (BUSPAR) 15 MG tablet Take 1 tablet (15 mg total) by mouth 3 (three) times daily. 08/19/19   Arfeen, Phillips Grout, MD  carvedilol (COREG) 12.5 MG tablet Take 12.5 mg by mouth 2 (two) times daily with a meal.    [provider]  escitalopram (LEXAPRO) 20 MG tablet Take 20 mg by mouth daily.    [provider]  hydrOXYzine (VISTARIL) 25 MG capsule Take 25-50 mg by mouth 2 (two)  times daily as needed for anxiety.     [provider]  insulin aspart (NOVOLOG) 100 UNIT/ML injection Inject 15 Units into the skin 3 (three) times daily before meals.    [provider]  insulin glargine (LANTUS) 100 UNIT/ML injection Inject 0.4 mLs (40 Units total) into the skin daily. Patient taking differently: Inject 25-50 Units into the skin daily. Inject 50 units subcutaneous in the morning,  then inject  25 units subcutaneous at night per patient 12/23/19   Joseph Art, DO  lisinopril (ZESTRIL) 20 MG tablet Take 20 mg by mouth daily.     [provider]  oxyCODONE (OXY IR/ROXICODONE) 5 MG immediate release tablet Take 1-2 tablets (5-10 mg total) by mouth every 4 (four) hours as needed for moderate pain. 10/18/20   Azucena Fallen, MD  pioglitazone (ACTOS) 15 MG tablet Take 15 mg by mouth daily.    [provider]  prazosin (MINIPRESS) 2 MG capsule Take 1 capsule (2 mg total) by mouth at bedtime. 08/19/19   Arfeen, Phillips Grout, MD  sildenafil (VIAGRA) 100 MG tablet Take 100 mg by mouth daily as needed for erectile dysfunction.    [provider]  traZODone (DESYREL) 50 MG tablet Take 1 tablet (50 mg total) by mouth at bedtime as needed. Patient taking differently: Take 50 mg by mouth at bedtime as needed for sleep. 08/19/19   Arfeen, Phillips Grout, MD    Allergies    Ozempic (0.25 or 0.5 mg-dose) [semaglutide(0.25 or 0.5mg -dos)], Semaglutide, and Lyrica [pregabalin]  Review of Systems   Review of Systems  Constitutional:  Negative for fever.  HENT:  Negative for ear pain and sore throat.   Eyes:  Negative for pain.  Respiratory:  Negative for cough.   Cardiovascular:  Negative for chest pain.  Gastrointestinal:  Negative for abdominal pain.  Genitourinary:  Negative for flank pain.  Musculoskeletal:  Negative for back pain.  Skin:  Positive for color change and rash.  Neurological:  Negative for syncope.  All other systems reviewed and are  negative.  Physical Exam Updated Vital Signs BP (!) 130/92   Pulse (!) 115   Temp 98.4 F (36.9 C) (Oral)   Resp 18   Ht 6\' 2"  (1.88 m)   Wt 131.5 kg   SpO2 100%   BMI 37.23 kg/m   Physical Exam Constitutional:      Appearance: He is well-developed.  HENT:     Head: Normocephalic.     Nose: Nose normal.  Eyes:     Extraocular Movements: Extraocular movements intact.  Cardiovascular:     Rate and Rhythm: Tachycardia present.  Pulmonary:     Effort: Pulmonary  effort is normal.  Skin:    Coloration: Skin is not jaundiced.     Comments: Region of erythema tenderness and small pustules/folliculitis present in the lower back approximately 6 x 6 cm region.  Neurological:     Mental Status: He is alert. Mental status is at baseline.    ED Results / Procedures / Treatments   Labs (all labs ordered are listed, but only abnormal results are displayed) Labs Reviewed  CBC WITH DIFFERENTIAL/PLATELET - Abnormal; Notable for the following components:      Result Value   WBC 17.5 (*)    RBC 4.15 (*)    Hemoglobin 11.5 (*)    HCT 35.4 (*)    Neutro Abs 14.4 (*)    Monocytes Absolute 1.2 (*)    Abs Immature Granulocytes 0.10 (*)    All other components within normal limits  BASIC METABOLIC PANEL - Abnormal; Notable for the following components:   Sodium 130 (*)    Glucose, Bld 326 (*)    Creatinine, Ser 1.26 (*)    Calcium 8.7 (*)    All other components within normal limits  CULTURE, BLOOD (ROUTINE X 2)  CULTURE, BLOOD (ROUTINE X 2)  RESP PANEL BY RT-PCR (FLU A&B, COVID) ARPGX2  LACTIC ACID, PLASMA  PROCALCITONIN  LACTIC ACID, PLASMA    EKG None  Radiology No results found.  Procedures Procedures   Medications Ordered in ED Medications  vancomycin (VANCOREADY) IVPB 2000 mg/400 mL (has no administration in time range)  sodium chloride 0.9 % bolus 1,000 mL (0 mLs Intravenous Stopped 11/30/20 1810)  acetaminophen (TYLENOL) tablet 650 mg (650 mg Oral Given 11/30/20  1705)    ED Course  I have reviewed the triage vital signs and the nursing notes.  Pertinent labs & imaging results that were available during my care of the patient were reviewed by me and considered in my medical decision making (see chart for details).    MDM Rules/Calculators/A&P                           Patient presents tachycardic, blood pressure appears normal.  Concern for cellulitis and developing sepsis.  Labs ordered and IV fluid initiated.  Patient white count that is 17.  He has a history of diabetes history of MRSA concern for developing sepsis given tachycardia elevated white count.  Lactic acid appears normal.  Patient given liter bolus of fluids with improvement of heart rate.  Started on IV vancomycin blood cultures are sent.  Patient admitted to the hospitalist team.  Final Clinical Impression(s) / ED Diagnoses Final diagnoses:  Cellulitis, unspecified cellulitis site    Rx / DC Orders ED Discharge Orders     None        Cheryll Cockayne, MD 11/30/20 [phone]

## 2020-11-30 NOTE — ED Notes (Signed)
ED TO INPATIENT HANDOFF REPORT  ED Nurse Name and Phone #:   S Name/Age/Gender Mark [name] 60 y.o. male Room/Bed: WA06/WA06  Code Status   Code Status: Full Code  Home/SNF/Other Home Patient oriented to: self, place, time, and situation Is this baseline? Yes   Triage Complete: Triage complete  Chief Complaint Sepsis due to cellulitis (HCC) [L03.90, A41.9]  Triage Note Pt has raised crusted over blisters w/ underlying redness to lower back.  Pain increase in the center on lower back 3 days ago.  Pt was seen at [facility] on Monday who put in a surgical consult as well as antibiotics. Antibiotics mailed to him so he has not yet gotten them.    Allergies Allergies  Allergen Reactions   Ozempic (0.25 Or 0.5 Mg-Dose) [Semaglutide(0.25 Or 0.5mg -Dos)] Other (See Comments)    Caused Pancreatitis   Semaglutide Other (See Comments)    Caused pancreatitis   Lyrica [Pregabalin] Rash    Level of Care/Admitting Diagnosis ED Disposition     ED Disposition  Admit   Condition  --   Comment  Hospital Area: [facility] [[id number]]  Level of Care: Med-Surg [16]  May place patient in observation at [facility] or [facility] if equivalent level of care is available:: No  Covid Evaluation: Asymptomatic Screening Protocol (No Symptoms)  Diagnosis: Sepsis due to cellulitis [name]) [[id number]]  Admitting Physician: Charlsie Quest [[id number]]  Attending Physician: Charlsie Quest [[id number]]          B Medical/Surgery History Past Medical History:  Diagnosis Date   Above knee amputation status, right    Anxiety    Depression    Diabetes mellitus, type II (HCC)    Hyperlipemia    Hypertension    PTSD (post-traumatic stress disorder)    Shoulder pain    Past Surgical History:  Procedure Laterality Date   HERNIA REPAIR     INCISION AND DRAINAGE Left 12/11/2019   Procedure: INCISION AND DRAINAGE POSTERIOR LEFT THIGH ABSCESS;  Surgeon: Axel Filler, MD;  Location: [facility]  OR;  Service: General;  Laterality: Left;   INCISION AND DRAINAGE ABSCESS Left 12/11/2019   Procedure: INCISION AND DRAINAGE BACK ABSCESS,;  Surgeon: Axel Filler, MD;  Location: [facility] OR;  Service: General;  Laterality: Left;   INCISION AND DRAINAGE ABSCESS Left 10/16/2020   Procedure: INCISION AND DRAINAGE ABSCESS;  Surgeon: Griselda Miner, MD;  Location: [facility] ORS;  Service: General;  Laterality: Left;   LEG AMPUTATION Right    parenatal cyst       A IV Location/Drains/Wounds Patient Lines/Drains/Airways Status     Active Line/Drains/Airways     Name Placement date Placement time Site Days   Peripheral IV 11/30/20 20 G Left Antecubital 11/30/20  1650  Antecubital  less than 1   Incision (Closed) 12/11/19 Back Other (Comment) 12/11/19  1010  -- 355   Incision (Closed) 12/11/19 Leg Left 12/11/19  1010  -- 355   Incision (Closed) 12/11/19 Buttocks Other (Comment) 12/11/19  1010  -- 355   Incision (Closed) 10/16/20 Buttocks 10/16/20  0828  -- 45   Wound / Incision (Open or Dehisced) 12/04/19 Non-pressure wound Leg Left;Posterior 12/04/19  2100  Leg  362   Wound / Incision (Open or Dehisced) 12/04/19 Non-pressure wound Back Right 12/04/19  2100  Back  362   Wound / Incision (Open or Dehisced) 10/14/20 Non-pressure wound Sacrum Lower minimal bleeding 10/14/20  2036  Sacrum  47   Wound / Incision (Open  or Dehisced) 10/14/20 Non-pressure wound Lumbar Left Purplish 10/14/20  2038  Lumbar  47            Intake/Output Last 24 hours  Intake/Output Summary (Last 24 hours) at 11/30/2020 1947 Last data filed at 11/30/2020 1810 Gross per 24 hour  Intake 999 ml  Output --  Net 999 ml    Labs/Imaging Results for orders placed or performed during the hospital encounter of 11/30/20 (from the past 48 hour(s))  CBC with Differential     Status: Abnormal   Collection Time: 11/30/20  4:27 PM  Result Value Ref Range   WBC 17.5 (H) 4.0 - 10.5 K/uL   RBC 4.15 (L) 4.22 - 5.81 MIL/uL   Hemoglobin  11.5 (L) 13.0 - 17.0 g/dL   HCT 79.8 (L) 92.1 - 19.4 %   MCV 85.3 80.0 - 100.0 fL   MCH 27.7 26.0 - 34.0 pg   MCHC 32.5 30.0 - 36.0 g/dL   RDW 17.4 08.1 - 44.8 %   Platelets 210 150 - 400 K/uL   nRBC 0.0 0.0 - 0.2 %   Neutrophils Relative % 82 %   Neutro Abs 14.4 (H) 1.7 - 7.7 K/uL   Lymphocytes Relative 9 %   Lymphs Abs 1.5 0.7 - 4.0 K/uL   Monocytes Relative 7 %   Monocytes Absolute 1.2 (H) 0.1 - 1.0 K/uL   Eosinophils Relative 1 %   Eosinophils Absolute 0.2 0.0 - 0.5 K/uL   Basophils Relative 0 %   Basophils Absolute 0.0 0.0 - 0.1 K/uL   Immature Granulocytes 1 %   Abs Immature Granulocytes 0.10 (H) 0.00 - 0.07 K/uL    Comment: Performed at [facility], [address] [place], [place] [address]  Basic metabolic panel     Status: Abnormal   Collection Time: 11/30/20  4:27 PM  Result Value Ref Range   Sodium 130 (L) 135 - 145 mmol/L   Potassium 4.2 3.5 - 5.1 mmol/L   Chloride 98 98 - 111 mmol/L   CO2 23 22 - 32 mmol/L   Glucose, Bld 326 (H) 70 - 99 mg/dL    Comment: Glucose reference range applies only to samples taken after fasting for at least 8 hours.   BUN 20 6 - 20 mg/dL   Creatinine, Ser 1.49 (H) 0.61 - 1.24 mg/dL   Calcium 8.7 (L) 8.9 - 10.3 mg/dL   GFR, Estimated >70 >26 mL/min    Comment: (NOTE) Calculated using the CKD-EPI Creatinine Equation (2021)    Anion gap 9 5 - 15    Comment: Performed at [facility], [address] [place], [place] [address]  Procalcitonin - Baseline     Status: None   Collection Time: 11/30/20  4:27 PM  Result Value Ref Range   Procalcitonin 0.36 ng/mL    Comment:        Interpretation: PCT (Procalcitonin) <= 0.5 ng/mL: Systemic infection (sepsis) is not likely. Local bacterial infection is possible. (NOTE)       Sepsis PCT Algorithm           Lower Respiratory Tract                                      Infection PCT Algorithm    ----------------------------      ----------------------------         PCT < 0.25 ng/mL  PCT < 0.10 ng/mL          Strongly encourage             Strongly discourage   discontinuation of antibiotics    initiation of antibiotics    ----------------------------     -----------------------------       PCT 0.25 - 0.50 ng/mL            PCT 0.10 - 0.25 ng/mL               OR       >80% decrease in PCT            Discourage initiation of                                            antibiotics      Encourage discontinuation           of antibiotics    ----------------------------     -----------------------------         PCT >= 0.50 ng/mL              PCT 0.26 - 0.50 ng/mL               AND        <80% decrease in PCT             Encourage initiation of                                             antibiotics       Encourage continuation           of antibiotics    ----------------------------     -----------------------------        PCT >= 0.50 ng/mL                  PCT > 0.50 ng/mL               AND         increase in PCT                  Strongly encourage                                      initiation of antibiotics    Strongly encourage escalation           of antibiotics                                     -----------------------------                                           PCT <= 0.25 ng/mL                                                 OR                                        >  80% decrease in PCT                                      Discontinue / Do not initiate                                             antibiotics  Performed at [facility], [address], [place], [place] [address]   Lactic acid, plasma     Status: None   Collection Time: 11/30/20  4:48 PM  Result Value Ref Range   Lactic Acid, Venous 1.9 0.5 - 1.9 mmol/L    Comment: Performed at [facility], [address], [place], [place] [address]  Lactic acid, plasma     Status: None    Collection Time: 11/30/20  6:28 PM  Result Value Ref Range   Lactic Acid, Venous 1.3 0.5 - 1.9 mmol/L    Comment: Performed at [facility], [address], [place], [place] [address]   No results found.  Pending Labs Unresulted Labs (From admission, onward)     Start     Ordered   12/01/20 0500  CBC  Tomorrow morning,   R        11/30/20 1932   12/01/20 0500  Basic metabolic panel  Tomorrow morning,   R        11/30/20 1932   11/30/20 1823  Resp Panel by RT-PCR (Flu A&B, Covid) Nasopharyngeal Swab  (Tier 2 - Symptomatic/asymptomatic)  Once,   STAT        11/30/20 1822   11/30/20 1628  Culture, blood (routine x 2)  BLOOD CULTURE X 2,   STAT      11/30/20 1629            Vitals/Pain Today's Vitals   11/30/20 1524 11/30/20 1525 11/30/20 1628 11/30/20 1800  BP: (!) 156/78  (!) 130/92   Pulse: (!) 127  (!) 115   Resp: 20  18   Temp: 98.4 F (36.9 C)     TempSrc: Oral     SpO2: 99%  100%   Weight:  131.5 kg    Height:  6\' 2"  (1.88 m)    PainSc:  9   7     Isolation Precautions No active isolations  Medications Medications  vancomycin (VANCOREADY) IVPB 2000 mg/400 mL (2,000 mg Intravenous New Bag/Given 11/30/20 1903)  enoxaparin (LOVENOX) injection 40 mg (has no administration in time range)  acetaminophen (TYLENOL) tablet 650 mg (has no administration in time range)    Or  acetaminophen (TYLENOL) suppository 650 mg (has no administration in time range)  oxyCODONE (Oxy IR/ROXICODONE) immediate release tablet 5 mg (has no administration in time range)  morphine 2 MG/ML injection 2 mg (has no administration in time range)  ondansetron (ZOFRAN) tablet 4 mg (has no administration in time range)    Or  ondansetron (ZOFRAN) injection 4 mg (has no administration in time range)  ARIPiprazole (ABILIFY) tablet 10 mg (has no administration in time range)  atorvastatin (LIPITOR) tablet 80 mg (has no administration in time range)  buPROPion (WELLBUTRIN XL) 24  hr tablet 300 mg (has no administration in time range)  busPIRone (BUSPAR) tablet 15 mg (has no administration in time range)  carvedilol (COREG) tablet  25 mg (has no administration in time range)  escitalopram (LEXAPRO) tablet 20 mg (has no administration in time range)  hydrOXYzine (VISTARIL) capsule 25-50 mg (has no administration in time range)  lisinopril (ZESTRIL) tablet 40 mg (has no administration in time range)  traZODone (DESYREL) tablet 50 mg (has no administration in time range)  insulin aspart (novoLOG) injection 0-5 Units (has no administration in time range)  insulin aspart (novoLOG) injection 35 Units (has no administration in time range)  insulin glargine-yfgn (SEMGLEE) injection 50 Units (has no administration in time range)  insulin glargine-yfgn (SEMGLEE) injection 25 Units (has no administration in time range)  sodium chloride 0.9 % bolus 1,000 mL (0 mLs Intravenous Stopped 11/30/20 1810)  acetaminophen (TYLENOL) tablet 650 mg (650 mg Oral Given 11/30/20 1705)  morphine 4 MG/ML injection 4 mg (4 mg Intravenous Given 11/30/20 1931)    Mobility power wheelchair Moderate fall risk   Focused Assessments     R Recommendations: See Admitting Provider Note  Report given to:   Additional Notes: A&O x 4 / on RA /  urinal /  has personal power wheelchair with him

## 2020-11-30 NOTE — H&P (Signed)
History and Physical    Mark [name] WUJ:[id number] DOB: 1960-03-31 DOA: 11/30/2020  PCP: [facility]  Patient coming from: Home  I have personally briefly reviewed patient's old medical records in [facility]  Chief Complaint: Cellulitis  HPI: Mark [name] is a 60 y.o. 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  Patient was admitted 10/14/2020-10/18/2020 for buttocks and pilonidal abscess.  He underwent I&D on 9/25.  Wound cultures grew MRSA.  Patient states that he is having continued wound care with packing as an outpatient.  3 days ago he noticed a linear lesion on his lower back.  Over the next 2 days he noted multiple pustular and painful lesions in the same area.  He has noticed blood from these lesions staining his clothes.  Due to worsening pain he presented to the ED for further evaluation.  He has not had any subjective fevers, chills, diaphoresis, chest pain, dyspnea, or dysuria.  ED Course:  Initial vitals showed BP 156/78, pulse 127, RR 20, temp 98.4 F, SPO2 99% on room air.  Labs show WBC 17.5, hemoglobin 11.5, platelets 210,000, sodium 130 (135 when corrected for hyperglycemia), potassium 4.2, bicarb 23, BUN 20, creatinine 1.26, serum glucose 326, procalcitonin 0.36, lactic acid 1.9.  Blood cultures and COVID/flu panel is in process.  Patient was given IV vancomycin, 1 L normal saline, Tylenol.  The hospitalist service was consulted to admit for further evaluation and management.  Review of Systems: All systems reviewed and are negative except as documented in history of present illness above.   Past Medical History:  Diagnosis Date   Above knee amputation status, right    Anxiety    Depression    Diabetes  mellitus, type II (HCC)    Hyperlipemia    Hypertension    PTSD (post-traumatic stress disorder)    Shoulder pain     Past Surgical History:  Procedure Laterality Date   HERNIA REPAIR     INCISION AND DRAINAGE Left 12/11/2019   Procedure: INCISION AND DRAINAGE POSTERIOR LEFT THIGH ABSCESS;  Surgeon: Axel Filler, MD;  Location: [facility] OR;  Service: General;  Laterality: Left;   INCISION AND DRAINAGE ABSCESS Left 12/11/2019   Procedure: INCISION AND DRAINAGE BACK ABSCESS,;  Surgeon: Axel Filler, MD;  Location: [facility] OR;  Service: General;  Laterality: Left;   INCISION AND DRAINAGE ABSCESS Left 10/16/2020   Procedure: INCISION AND DRAINAGE ABSCESS;  Surgeon: Griselda Miner, MD;  Location: [facility] ORS;  Service: General;  Laterality: Left;   LEG AMPUTATION Right    parenatal cyst      Social History:  reports that he quit smoking about 15 months ago. His smoking use included cigarettes. He has never used smokeless tobacco. He reports current alcohol use of about 4.0 standard drinks per week. He reports current drug use. Frequency: 14.00 times per week. Drug: Marijuana.  Allergies  Allergen Reactions   Ozempic (0.25 Or 0.5 Mg-Dose) [Semaglutide(0.25 Or 0.5mg -Dos)] Other (See Comments)    Caused Pancreatitis   Semaglutide Other (See Comments)    Caused pancreatitis   Lyrica [Pregabalin] Rash    Family History  Problem Relation Age of Onset   Aneurysm Mother    Leukemia Father    Dementia Maternal Aunt    Seizures Paternal Uncle      Prior to Admission  medications   Medication Sig Start Date End Date Taking? Authorizing Provider  acetaminophen (TYLENOL) 500 MG tablet Take 2 tablets (1,000 mg total) by mouth every 6 (six) hours as needed. 10/18/20   Barnetta Chapel, PA-C  ARIPiprazole (ABILIFY) 10 MG tablet Take 10 mg by mouth daily.    [provider]  atorvastatin (LIPITOR) 80 MG tablet Take 80 mg by mouth daily.      buPROPion (WELLBUTRIN XL) 300 MG 24 hr tablet Take 1  tablet (300 mg total) by mouth daily. 08/19/19   Arfeen, Phillips Grout, MD  busPIRone (BUSPAR) 15 MG tablet Take 1 tablet (15 mg total) by mouth 3 (three) times daily. 08/19/19   Arfeen, Phillips Grout, MD  carvedilol (COREG) 12.5 MG tablet Take 12.5 mg by mouth 2 (two) times daily with a meal.    [provider]  escitalopram (LEXAPRO) 20 MG tablet Take 20 mg by mouth daily.    [provider]  hydrOXYzine (VISTARIL) 25 MG capsule Take 25-50 mg by mouth 2 (two) times daily as needed for anxiety.     [provider]  insulin aspart (NOVOLOG) 100 UNIT/ML injection Inject 15 Units into the skin 3 (three) times daily before meals.    [provider]  insulin glargine (LANTUS) 100 UNIT/ML injection Inject 0.4 mLs (40 Units total) into the skin daily. Patient taking differently: Inject 25-50 Units into the skin daily. Inject 50 units subcutaneous in the morning,  then inject  25 units subcutaneous at night per patient 12/23/19   Joseph Art, DO  lisinopril (ZESTRIL) 20 MG tablet Take 20 mg by mouth daily.     [provider]  oxyCODONE (OXY IR/ROXICODONE) 5 MG immediate release tablet Take 1-2 tablets (5-10 mg total) by mouth every 4 (four) hours as needed for moderate pain. 10/18/20   Azucena Fallen, MD  pioglitazone (ACTOS) 15 MG tablet Take 15 mg by mouth daily.    [provider]  prazosin (MINIPRESS) 2 MG capsule Take 1 capsule (2 mg total) by mouth at bedtime. 08/19/19   Arfeen, Phillips Grout, MD  sildenafil (VIAGRA) 100 MG tablet Take 100 mg by mouth daily as needed for erectile dysfunction.    [provider]  traZODone (DESYREL) 50 MG tablet Take 1 tablet (50 mg total) by mouth at bedtime as needed. Patient taking differently: Take 50 mg by mouth at bedtime as needed for sleep. 08/19/19   [name], MD    Physical Exam: Vitals:   11/30/20 1524 11/30/20 1525 11/30/20 1628  BP: (!) 156/78  (!) 130/92  Pulse: (!) 127  (!) 115  Resp: 20  18   Temp: 98.4 F (36.9 C)    TempSrc: Oral    SpO2: 99%  100%  Weight:  131.5 kg   Height:  6\' 2"  (1.88 m)    Constitutional: Obese man resting in bed in the left lateral decubitus position.  NAD, calm, comfortable Eyes: PERRL, lids and conjunctivae normal ENMT: Mucous membranes are moist. Posterior pharynx clear of any exudate or lesions.Normal dentition.  Neck: normal, supple, no masses. Respiratory: clear to auscultation bilaterally, no wheezing, no crackles. Normal respiratory effort. No accessory muscle use.  Cardiovascular: Regular rate and rhythm, no murmurs / rubs / gallops. No extremity edema. Abdomen: no tenderness, no masses palpated. No hepatosplenomegaly.  Musculoskeletal: S/p right AKA.  No clubbing / cyanosis. No joint deformity upper and lower extremities. Good ROM, no contractures. Normal muscle tone.  Skin: Multiple comedone  lesions, warm with surrounding erythema and tenderness on palpation.  Multiple prior surgical scars noted. Neurologic: CN 2-12 grossly intact. Sensation intact. Strength 5/5 in all 4.  Psychiatric: Normal judgment and insight. Alert and oriented x 3. Normal mood.      Labs on Admission: I have personally reviewed following labs and imaging studies  CBC: Recent Labs  Lab 11/30/20 1627  WBC 17.5*  NEUTROABS 14.4*  HGB 11.5*  HCT 35.4*  MCV 85.3  PLT 210   Basic Metabolic Panel: Recent Labs  Lab 11/30/20 1627  NA 130*  K 4.2  CL 98  CO2 23  GLUCOSE 326*  BUN 20  CREATININE 1.26*  CALCIUM 8.7*   GFR: Estimated Creatinine Clearance: 89.9 mL/min (A) (by C-G formula based on SCr of 1.26 mg/dL (H)). Liver Function Tests: No results for input(s): AST, ALT, ALKPHOS, BILITOT, PROT, ALBUMIN in the last 168 hours. No results for input(s): LIPASE, AMYLASE in the last 168 hours. No results for input(s): AMMONIA in the last 168 hours. Coagulation Profile: No results for input(s): INR, PROTIME in the last 168 hours. Cardiac Enzymes: No  results for input(s): CKTOTAL, CKMB, CKMBINDEX, TROPONINI in the last 168 hours. BNP (last 3 results) No results for input(s): PROBNP in the last 8760 hours. HbA1C: No results for input(s): HGBA1C in the last 72 hours. CBG: No results for input(s): GLUCAP in the last 168 hours. Lipid Profile: No results for input(s): CHOL, HDL, LDLCALC, TRIG, CHOLHDL, LDLDIRECT in the last 72 hours. Thyroid Function Tests: No results for input(s): TSH, T4TOTAL, FREET4, T3FREE, THYROIDAB in the last 72 hours. Anemia Panel: No results for input(s): VITAMINB12, FOLATE, FERRITIN, TIBC, IRON, RETICCTPCT in the last 72 hours. Urine analysis:    Component Value Date/Time   COLORURINE YELLOW 10/14/2020 1436   APPEARANCEUR CLEAR 10/14/2020 1436   LABSPEC 1.010 10/14/2020 1436   PHURINE 6.0 10/14/2020 1436   GLUCOSEU >=500 (A) 10/14/2020 1436   HGBUR NEGATIVE 10/14/2020 1436   BILIRUBINUR NEGATIVE 10/14/2020 1436   KETONESUR 20 (A) 10/14/2020 1436   PROTEINUR 30 (A) 10/14/2020 1436   NITRITE NEGATIVE 10/14/2020 1436   LEUKOCYTESUR NEGATIVE 10/14/2020 1436    Radiological Exams on Admission: No results found.  EKG: Not performed.  Assessment/Plan Principal Problem:   Sepsis due to cellulitis [facility]) Active Problems:   Major depressive disorder, recurrent episode, moderate (HCC)   Generalized anxiety disorder   Hypertension associated with diabetes (HCC)   Hyperlipidemia associated with type 2 diabetes mellitus (HCC)   Cellulitis of lower back   Mark [name] is a 60 y.o. 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  Sepsis due to lower back cellulitis: Patient presenting with leukocytosis, tachycardia, and lower back cellulitis with multiple folliculitis  lesions.  Patient with recent admission for MRSA buttocks abscess. -Continue IV vancomycin per pharmacy -Follow blood cultures  Insulin-dependent type 2 diabetes: Hyperglycemic on admission without evidence of DKA/HHS.  Resume home insulin regimen. -Insulin Semglee 50 units a.m. and 25 units p.m. -NovoLog 25 units TIDAC  Hypertension: Continue Coreg and lisinopril.  Hyperlipidemia: Continue atorvastatin.  Depression/anxiety/bipolar disorder: Continue Abilify, bupropion, buspirone, escitalopram.  Continue hydroxyzine as needed.  OSA: Continue CPAP nightly.  DVT prophylaxis: Lovenox Code Status: Full code, confirmed with patient Family Communication: Discussed with patient, he has discussed with family Disposition Plan: From home,  dispo pending clinical progress Consults called: None Level of care: Med-Surg Admission status:  Status is: Observation  The patient remains OBS appropriate and will d/c before 2 midnights.   Darreld Mclean MD [facility]  If 7PM-7AM, please contact night-coverage [email or web address]  11/30/2020, 7:11 PM

## 2020-11-30 NOTE — ED Notes (Signed)
Pt given turkey sandwich

## 2020-11-30 NOTE — ED Triage Notes (Signed)
Pt has raised crusted over blisters w/ underlying redness to lower back.  Pain increase in the center on lower back 3 days ago.  Pt was seen at [facility] on Monday who put in a surgical consult as well as antibiotics. Antibiotics mailed to him so he has not yet gotten them.

## 2020-11-30 NOTE — Progress Notes (Signed)
Pharmacy Antibiotic Note  Mark [name] is a 60 y.o. male admitted on 11/30/2020 with  sepsis due to lower back cellulitis .  Pharmacy has been consulted for Vancomycin dosing.  Plan: Vancomycin 2g IV x 1 given in the ED. Continue with Vancomycin 1g IV q12h Vancomycin levels at steady state, as indicated Monitor renal function, cultures, clinical course  Height: 6\' 2"  (188 cm) Weight: 131.5 kg (290 lb) IBW/kg (Calculated) : 82.2  Temp (24hrs), Avg:98.4 F (36.9 C), Min:98.4 F (36.9 C), Max:98.4 F (36.9 C)  Recent Labs  Lab 11/30/20 1627 11/30/20 1648 11/30/20 1828  WBC 17.5*  --   --   CREATININE 1.26*  --   --   LATICACIDVEN  --  1.9 1.3    Estimated Creatinine Clearance: 89.9 mL/min (A) (by C-G formula based on SCr of 1.26 mg/dL (H)).    Allergies  Allergen Reactions   Ozempic (0.25 Or 0.5 Mg-Dose) [Semaglutide(0.25 Or 0.5mg -Dos)] Other (See Comments)    Caused Pancreatitis   Semaglutide Other (See Comments)    Caused pancreatitis   Lyrica [Pregabalin] Rash    Antimicrobials this admission: 11/9 Vancomycin >>  Dose adjustments this admission: --  Microbiology results: 11/9 BCx: sent 11/9 Resp panel: sent   Thank you for allowing pharmacy to be a part of this patient's care.   13/9, PharmD, BCPS Clinical Pharmacist  11/30/2020 8:40 PM

## 2020-12-01 ENCOUNTER — Ambulatory Visit (HOSPITAL_COMMUNITY): Payer: No Typology Code available for payment source | Admitting: Licensed Clinical Social Worker

## 2020-12-01 ENCOUNTER — Encounter (HOSPITAL_COMMUNITY): Payer: Self-pay

## 2020-12-01 ENCOUNTER — Inpatient Hospital Stay (HOSPITAL_COMMUNITY): Payer: No Typology Code available for payment source

## 2020-12-01 DIAGNOSIS — Z888 Allergy status to other drugs, medicaments and biological substances status: Secondary | ICD-10-CM | POA: Diagnosis not present

## 2020-12-01 DIAGNOSIS — L03312 Cellulitis of back [any part except buttock]: Secondary | ICD-10-CM

## 2020-12-01 DIAGNOSIS — L0231 Cutaneous abscess of buttock: Secondary | ICD-10-CM | POA: Diagnosis present

## 2020-12-01 DIAGNOSIS — Z20822 Contact with and (suspected) exposure to covid-19: Secondary | ICD-10-CM | POA: Diagnosis present

## 2020-12-01 DIAGNOSIS — A419 Sepsis, unspecified organism: Secondary | ICD-10-CM | POA: Diagnosis present

## 2020-12-01 DIAGNOSIS — G4733 Obstructive sleep apnea (adult) (pediatric): Secondary | ICD-10-CM | POA: Diagnosis present

## 2020-12-01 DIAGNOSIS — F419 Anxiety disorder, unspecified: Secondary | ICD-10-CM | POA: Diagnosis not present

## 2020-12-01 DIAGNOSIS — E1122 Type 2 diabetes mellitus with diabetic chronic kidney disease: Secondary | ICD-10-CM | POA: Diagnosis present

## 2020-12-01 DIAGNOSIS — F331 Major depressive disorder, recurrent, moderate: Secondary | ICD-10-CM | POA: Diagnosis not present

## 2020-12-01 DIAGNOSIS — F411 Generalized anxiety disorder: Secondary | ICD-10-CM | POA: Diagnosis present

## 2020-12-01 DIAGNOSIS — L02232 Carbuncle of back [any part, except buttock]: Secondary | ICD-10-CM | POA: Diagnosis present

## 2020-12-01 DIAGNOSIS — Z89611 Acquired absence of right leg above knee: Secondary | ICD-10-CM | POA: Diagnosis not present

## 2020-12-01 DIAGNOSIS — Z794 Long term (current) use of insulin: Secondary | ICD-10-CM | POA: Diagnosis not present

## 2020-12-01 DIAGNOSIS — L0390 Cellulitis, unspecified: Secondary | ICD-10-CM | POA: Diagnosis present

## 2020-12-01 DIAGNOSIS — E785 Hyperlipidemia, unspecified: Secondary | ICD-10-CM | POA: Diagnosis present

## 2020-12-01 DIAGNOSIS — E11649 Type 2 diabetes mellitus with hypoglycemia without coma: Secondary | ICD-10-CM | POA: Diagnosis not present

## 2020-12-01 DIAGNOSIS — L02212 Cutaneous abscess of back [any part, except buttock]: Secondary | ICD-10-CM | POA: Diagnosis present

## 2020-12-01 DIAGNOSIS — F319 Bipolar disorder, unspecified: Secondary | ICD-10-CM | POA: Diagnosis present

## 2020-12-01 DIAGNOSIS — E1165 Type 2 diabetes mellitus with hyperglycemia: Secondary | ICD-10-CM | POA: Diagnosis present

## 2020-12-01 DIAGNOSIS — I152 Hypertension secondary to endocrine disorders: Secondary | ICD-10-CM | POA: Diagnosis present

## 2020-12-01 DIAGNOSIS — N1831 Chronic kidney disease, stage 3a: Secondary | ICD-10-CM | POA: Diagnosis present

## 2020-12-01 DIAGNOSIS — E1169 Type 2 diabetes mellitus with other specified complication: Secondary | ICD-10-CM | POA: Diagnosis present

## 2020-12-01 DIAGNOSIS — Z8614 Personal history of Methicillin resistant Staphylococcus aureus infection: Secondary | ICD-10-CM | POA: Diagnosis not present

## 2020-12-01 DIAGNOSIS — Z79899 Other long term (current) drug therapy: Secondary | ICD-10-CM | POA: Diagnosis not present

## 2020-12-01 DIAGNOSIS — Z87891 Personal history of nicotine dependence: Secondary | ICD-10-CM | POA: Diagnosis not present

## 2020-12-01 DIAGNOSIS — L739 Follicular disorder, unspecified: Secondary | ICD-10-CM | POA: Diagnosis present

## 2020-12-01 DIAGNOSIS — F4310 Post-traumatic stress disorder, unspecified: Secondary | ICD-10-CM | POA: Diagnosis present

## 2020-12-01 LAB — GLUCOSE, CAPILLARY
Glucose-Capillary: 380 mg/dL — ABNORMAL HIGH (ref 70–99)
Glucose-Capillary: 362 mg/dL — ABNORMAL HIGH (ref 70–99)
Glucose-Capillary: 158 mg/dL — ABNORMAL HIGH (ref 70–99)
Glucose-Capillary: 115 mg/dL — ABNORMAL HIGH (ref 70–99)

## 2020-12-01 LAB — BASIC METABOLIC PANEL
Anion gap: 11 (ref 5–15)
BUN: 21 mg/dL — ABNORMAL HIGH (ref 6–20)
CO2: 20 mmol/L — ABNORMAL LOW (ref 22–32)
Calcium: 8.6 mg/dL — ABNORMAL LOW (ref 8.9–10.3)
Chloride: 101 mmol/L (ref 98–111)
Creatinine, Ser: 1.23 mg/dL (ref 0.61–1.24)
GFR, Estimated: >60 mL/min (ref >60)
Glucose, Bld: 370 mg/dL — ABNORMAL HIGH (ref 70–99)
Potassium: 4.5 mmol/L (ref 3.5–5.1)
Sodium: 132 mmol/L — ABNORMAL LOW (ref 135–145)

## 2020-12-01 LAB — CBC
HCT: 33.1 % — ABNORMAL LOW (ref 39.0–52.0)
Hemoglobin: 10.6 g/dL — ABNORMAL LOW (ref 13.0–17.0)
MCH: 27.8 pg (ref 26.0–34.0)
MCHC: 32 g/dL (ref 30.0–36.0)
MCV: 86.9 fL (ref 80.0–100.0)
Platelets: 187 10*3/uL (ref 150–400)
RBC: 3.81 MIL/uL — ABNORMAL LOW (ref 4.22–5.81)
RDW: 14.3 % (ref 11.5–15.5)
WBC: 15.6 10*3/uL — ABNORMAL HIGH (ref 4.0–10.5)
nRBC: 0 % (ref 0.0–0.2)

## 2020-12-01 MED ORDER — INSULIN ASPART 100 UNIT/ML IJ SOLN
15.0000 [IU] | Freq: Three times a day (TID) | INTRAMUSCULAR | Status: DC
Start: 2020-12-01 — End: 2020-12-03
  Administered 2020-12-01 – 2020-12-03 (×5): 15 [IU] via SUBCUTANEOUS

## 2020-12-01 MED ORDER — MORPHINE SULFATE (PF) 4 MG/ML IV SOLN
4.0000 mg | INTRAVENOUS | Status: DC | PRN
  Administered 2020-12-01 – 2020-12-02 (×5): 4 mg via INTRAVENOUS
  Filled 2020-12-01 (×5): qty 1

## 2020-12-01 MED ORDER — INSULIN ASPART 100 UNIT/ML IJ SOLN
0.0000 [IU] | Freq: Three times a day (TID) | INTRAMUSCULAR | Status: DC
  Administered 2020-12-01: 4 [IU] via SUBCUTANEOUS
  Administered 2020-12-02 (×2): 3 [IU] via SUBCUTANEOUS
  Administered 2020-12-03: 7 [IU] via SUBCUTANEOUS
  Administered 2020-12-04: 4 [IU] via SUBCUTANEOUS
  Administered 2020-12-04: 9 [IU] via SUBCUTANEOUS
  Administered 2020-12-05: 3 [IU] via SUBCUTANEOUS
  Administered 2020-12-05 – 2020-12-06 (×2): 4 [IU] via SUBCUTANEOUS
  Administered 2020-12-06: 11 [IU] via SUBCUTANEOUS
  Administered 2020-12-06: 7 [IU] via SUBCUTANEOUS
  Administered 2020-12-07 – 2020-12-08 (×3): 4 [IU] via SUBCUTANEOUS
  Administered 2020-12-08: 09:00:00 7 [IU] via SUBCUTANEOUS

## 2020-12-01 MED ORDER — SENNOSIDES-DOCUSATE SODIUM 8.6-50 MG PO TABS
1.0000 | ORAL_TABLET | Freq: Two times a day (BID) | ORAL | Status: DC
  Administered 2020-12-01 – 2020-12-04 (×6): 1 via ORAL
  Filled 2020-12-01 (×13): qty 1

## 2020-12-01 MED ORDER — IOHEXOL 350 MG/ML SOLN
80.0000 mL | Freq: Once | INTRAVENOUS | Status: AC | PRN
  Administered 2020-12-01: 80 mL via INTRAVENOUS

## 2020-12-01 MED ORDER — VALACYCLOVIR HCL 500 MG PO TABS
1000.0000 mg | ORAL_TABLET | Freq: Three times a day (TID) | ORAL | Status: DC
  Administered 2020-12-01 – 2020-12-08 (×21): 1000 mg via ORAL
  Filled 2020-12-01 (×24): qty 2

## 2020-12-01 MED ORDER — SODIUM CHLORIDE 0.9 % IV SOLN
2.0000 g | INTRAVENOUS | Status: DC
  Administered 2020-12-01 – 2020-12-02 (×2): 2 g via INTRAVENOUS
  Filled 2020-12-01 (×2): qty 20

## 2020-12-01 MED ORDER — POLYETHYLENE GLYCOL 3350 17 G PO PACK
17.0000 g | PACK | Freq: Every day | ORAL | Status: DC
  Administered 2020-12-01 – 2020-12-02 (×2): 17 g via ORAL
  Filled 2020-12-01 (×7): qty 1

## 2020-12-01 MED ORDER — DIATRIZOATE MEGLUMINE & SODIUM 66-10 % PO SOLN
30.0000 mL | Freq: Once | ORAL | Status: AC
  Administered 2020-12-01: 30 mL via ORAL
  Filled 2020-12-01: qty 30

## 2020-12-01 MED ORDER — DIATRIZOATE MEGLUMINE & SODIUM 66-10 % PO SOLN
ORAL | Status: AC
  Filled 2020-12-01: qty 30

## 2020-12-01 NOTE — Progress Notes (Signed)
PROGRESS NOTE  Mark [name] WEX:[id number] DOB: 02-Mar-1960 DOA: 11/30/2020 PCP: [facility]   LOS: 0 days   Brief Narrative / Interim history: Mark [name] is a 60 y.o. male with medical history significant for insulin-dependent type 2 diabetes, hypertension, hyperlipidemia, OSA on CPAP, s/p right AKA, depression/anxiety/bipolar disorder, and recurrent cellulitis/abscesses of the buttocks who presented to the ED for evaluation of lower back cellulitis.  Patient was recently admitted 9/20 3-09/2025 2022 for buttock and pilonidal abscess, underwent incisional debridement on 9/25.  Wound cultures grew MRSA, speciated after his discharge.  He was sent home on Augmentin.  Few days ago he noted a new linear lesion on his lower back with multiple pustular and painful lesions the same area, clustered.  He has noticed small bloody drainage but the main thing was excruciating pain that he has had with his lesions and that is what prompted him to come to the ED  Subjective / 24h Interval events: Continues to have severe pain, continues to require IV morphine  Assessment & Plan: Principal Problem Lower back cellulitis, sepsis-he presented and was admitted to the hospital with leukocytosis, tachycardia, and a source of infection. -He was placed on vancomycin -Cellulitic appearance seems to have resolved today but he continues to have a cluster of pustular/vesicular lesions which are very tender to palpation -ID consulted, discussed with Dr. Thedore Mins, appreciate input, obtain CT scan of the abdomen and pelvis as well as adding ceftriaxone  Active Problems Essential hypertension-continue home medications with Coreg, lisinopril  Depression/anxiety-continue home regimen  OSA-continue CPAP  IDDM, with hyperglycemia, poorly controlled with A1c 9.9 - Continue glargine 15 in the morning and 20 5 in the evening along with 15 units of mealtime coverage plus resistant sliding scale  CBG (last 3)  Recent  Labs    11/30/20 2227 12/01/20 0730  GLUCAP 358* 380*     Hypertension: Continue Coreg and lisinopril.   Hyperlipidemia: Continue atorvastatin.   Depression/anxiety/bipolar disorder: Continue Abilify, bupropion, buspirone, escitalopram.  Continue hydroxyzine as needed.   OSA: Continue CPAP nightly.  Scheduled Meds:  ARIPiprazole  10 mg Oral Daily   atorvastatin  80 mg Oral Daily   buPROPion  300 mg Oral Daily   busPIRone  15 mg Oral TID   carvedilol  25 mg Oral BID WC   enoxaparin (LOVENOX) injection  40 mg Subcutaneous Q24H   escitalopram  20 mg Oral Daily   insulin aspart  0-5 Units Subcutaneous QHS   insulin aspart  35 Units Subcutaneous TID WC   insulin glargine-yfgn  25 Units Subcutaneous QHS   insulin glargine-yfgn  50 Units Subcutaneous q morning   lisinopril  40 mg Oral Daily   polyethylene glycol  17 g Oral Daily   senna-docusate  1 tablet Oral BID   Continuous Infusions:  vancomycin 1,000 mg (12/01/20 0655)   PRN Meds:.acetaminophen **OR** acetaminophen, hydrOXYzine, morphine injection, ondansetron **OR** ondansetron (ZOFRAN) IV, oxyCODONE, traZODone  Diet Orders (From admission, onward)     Start     Ordered   11/30/20 1928  Diet heart healthy/carb modified Room service appropriate? Yes; Fluid consistency: Thin  Diet effective now       Question Answer Comment  Diet-HS Snack? Nothing   Room service appropriate? Yes   Fluid consistency: Thin      11/30/20 1927            DVT prophylaxis: enoxaparin (LOVENOX) injection 40 mg Start: 11/30/20 1945     Code Status: Full Code  Family  Communication: No family at bedside  Status is: Observation  The patient will require care spanning > 2 midnights and should be moved to inpatient because: Requiring IV pain medications, IV antibiotics  Level of care: Med-Surg  Consultants:  ID  Procedures:  none  Microbiology  Blood cultures 11/9-no growth to date  Antimicrobials: Vancomycin  11/9 Ceftriaxone 11/10   Objective: Vitals:   11/30/20 2349 12/01/20 0155 12/01/20 0512 12/01/20 0817  BP: (!) 142/72 (!) 144/71 138/64 (!) 160/57  Pulse: (!) 119 (!) 101 (!) 109 (!) 108  Resp: 17 16 18 18   Temp: 99.4 F (37.4 C) 98 F (36.7 C) 98.1 F (36.7 C) 98.3 F (36.8 C)  TempSrc: Oral  Oral Oral  SpO2: 100% 98% 98% 97%  Weight:      Height:        Intake/Output Summary (Last 24 hours) at 12/01/2020 1133 Last data filed at 12/01/2020 1039 Gross per 24 hour  Intake 1169 ml  Output 1050 ml  Net 119 ml   Filed Weights   11/30/20 1525  Weight: 131.5 kg    Examination:  Constitutional: NAD Eyes: no scleral icterus ENMT: Mucous membranes are moist.  Neck: normal, supple Respiratory: clear to auscultation bilaterally, no wheezing, no crackles. Normal respiratory effort.  Cardiovascular: Regular rate and rhythm, no murmurs / rubs / gallops. No LE edema. Good peripheral pulses Abdomen: non distended, no tenderness. Bowel sounds positive.  Musculoskeletal: no clubbing / cyanosis.  Skin: Rashes below Neurologic: CN 2-12 grossly intact. Strength 5/5 in all 4.  Psychiatric: Normal judgment and insight. Alert and oriented x 3. Normal mood.     Data Reviewed: I have independently reviewed following labs and imaging studies   CBC: Recent Labs  Lab 11/30/20 1627 12/01/20 0315  WBC 17.5* 15.6*  NEUTROABS 14.4*  --   HGB 11.5* 10.6*  HCT 35.4* 33.1*  MCV 85.3 86.9  PLT 210 187   Basic Metabolic Panel: Recent Labs  Lab 11/30/20 1627 12/01/20 0315  NA 130* 132*  K 4.2 4.5  CL 98 101  CO2 23 20*  GLUCOSE 326* 370*  BUN 20 21*  CREATININE 1.26* 1.23  CALCIUM 8.7* 8.6*   Liver Function Tests: No results for input(s): AST, ALT, ALKPHOS, BILITOT, PROT, ALBUMIN in the last 168 hours. Coagulation Profile: No results for input(s): INR, PROTIME in the last 168 hours. HbA1C: No results for input(s): HGBA1C in the last 72 hours. CBG: Recent Labs  Lab  11/30/20 2227 12/01/20 0730  GLUCAP 358* 380*    Recent Results (from the past 240 hour(s))  Culture, blood (routine x 2)     Status: None (Preliminary result)   Collection Time: 11/30/20  4:28 PM   Specimen: BLOOD LEFT FOREARM  Result Value Ref Range Status   Specimen Description   Final    BLOOD LEFT FOREARM Performed at [facility], [address] [place], [place]    Special Requests   Final    BOTTLES DRAWN AEROBIC AND ANAEROBIC Blood Culture results may not be optimal due to an inadequate volume of blood received in culture bottles Performed at [facility], [address], [place], [place]    Culture   Final    NO GROWTH < 12 HOURS Performed at [facility], [address], [place], [place]    Report Status PENDING  Incomplete  Culture, blood (routine x 2)     Status: None (Preliminary result)  Collection Time: 11/30/20  4:33 PM   Specimen: BLOOD  Result Value Ref Range Status   Specimen Description   Final    BLOOD LEFT ANTECUBITAL Performed at [facility], [address] [place], [place] [address]    Special Requests   Final    BOTTLES DRAWN AEROBIC AND ANAEROBIC Blood Culture results may not be optimal due to an inadequate volume of blood received in culture bottles Performed at [facility], [address] [place], [place] [address]    Culture   Final    NO GROWTH < 12 HOURS Performed at [facility], [address] [place], [place] [address]    Report Status PENDING  Incomplete  Resp Panel by RT-PCR (Flu A&B, Covid) Nasopharyngeal Swab     Status: None   Collection Time: 11/30/20  6:23 PM   Specimen: Nasopharyngeal Swab; Nasopharyngeal(NP) swabs in vial transport medium  Result Value Ref Range Status   SARS Coronavirus 2 by RT PCR NEGATIVE NEGATIVE Final    Comment: (NOTE) SARS-CoV-2 target nucleic acids are NOT DETECTED.  The  SARS-CoV-2 RNA is generally detectable in upper respiratory specimens during the acute phase of infection. The lowest concentration of SARS-CoV-2 viral copies this assay can detect is 138 copies/mL. A negative result does not preclude SARS-Cov-2 infection and should not be used as the sole basis for treatment or other patient management decisions. A negative result may occur with  improper specimen collection/handling, submission of specimen other than nasopharyngeal swab, presence of viral mutation(s) within the areas targeted by this assay, and inadequate number of viral copies(<138 copies/mL). A negative result must be combined with clinical observations, patient history, and epidemiological information. The expected result is Negative.  Fact Sheet for Patients:  BloggerCourse.com  Fact Sheet for Healthcare Providers:  SeriousBroker.it  This test is no t yet approved or cleared by the [place] FDA and  has been authorized for detection and/or diagnosis of SARS-CoV-2 by FDA under an Emergency Use Authorization (EUA). This EUA will remain  in effect (meaning this test can be used) for the duration of the COVID-19 declaration under Section 564(b)(1) of the Act, 21 U.S.C.section 360bbb-3(b)(1), unless the authorization is terminated  or revoked sooner.       Influenza A by PCR NEGATIVE NEGATIVE Final   Influenza B by PCR NEGATIVE NEGATIVE Final    Comment: (NOTE) The Xpert Xpress SARS-CoV-2/FLU/RSV plus assay is intended as an aid in the diagnosis of influenza from Nasopharyngeal swab specimens and should not be used as a sole basis for treatment. Nasal washings and aspirates are unacceptable for Xpert Xpress SARS-CoV-2/FLU/RSV testing.  Fact Sheet for Patients: BloggerCourse.com  Fact Sheet for Healthcare Providers: SeriousBroker.it  This test is not yet approved or  cleared by the [place] FDA and has been authorized for detection and/or diagnosis of SARS-CoV-2 by FDA under an Emergency Use Authorization (EUA). This EUA will remain in effect (meaning this test can be used) for the duration of the COVID-19 declaration under Section 564(b)(1) of the Act, 21 U.S.C. section 360bbb-3(b)(1), unless the authorization is terminated or revoked.  Performed at [facility], [address], [place], [place] [address]      Radiology Studies: No results found.   Pamella Pert, MD, PhD [facility]  Between 7 am - 7 pm I am available, please contact me via Amion (for emergencies) or Securechat (non urgent messages)  Between 7 pm - 7 am I am  not available, please contact night coverage MD/APP via Amion

## 2020-12-01 NOTE — Consult Note (Signed)
[facility]    Date of Admission:  11/30/2020   Total days of antibiotics 2        Reason for Consult: cellulitis    Principal Problem:   Sepsis due to cellulitis [facility]) Active Problems:   Major depressive disorder, recurrent episode, moderate (HCC)   Generalized anxiety disorder   Hypertension associated with diabetes (HCC)   Hyperlipidemia associated with type 2 diabetes mellitus (HCC)   Cellulitis of lower back   Cellulitis   Assessment: 60 year old male with poorly controlled diabetes (A1c 9.9 on 10/15/20), pilondial cyst, recurrent abscesses with MRSA, recently admitted 9/25-9/27 for abscess on his back which was I&D subsequenly discharged on Augmentin prior to cultures  returning positive for MRSA presented with new infection on his back.  He was started on vancomycin and ID consulted for antibiotic recommendations.  #Cellulitis/possible abscess on his back #Poorly controlled diabetes #Leukocytosis Patient showed a photo of where his edema and erythema  started on his back and it is midline of the I&D done in September.  His clinical picture is consistent with cellulitis/underlying abscess rather than herpetic infection.  He does have multiple sites his lesions which almost look like scabbed over vesicles which cross the midline-possible HSV. Will get imaging to verify any underlying abscess. Of note his skin was taught and tender upto mid back.   Recommendations:  -CT AP w/con -I&D performed please obtain VZV, HSV PCR and bacterial cultures -Follow blood Cx -Add ceftriaxone 2gm q24h for gram negative coverage due to surrounding cellulitis and continue vancomycin -Add valtrex    Microbiology:  Antibiotics: Vancomycin  11/9-present Cultures: 11/09 Blood Cx NGTD  HPI: Mark [name] is a 60 y.o. male poorly controlled diabetes, hypertension, hyperlipidemia, right-sided AKA, pilondial cyst, recurrent abscess with MRSA(12/11/19,10/16/20)  last admission on 9/25-9/27 where he underwent I&D of an abscess on his back(discharged on Augmentin prior to cultures came back positive for MRSA) presented with concern for new infection in his back.  He reports that on Monday he started feeling discomfort on his back.  He had gone to same-day clinic at the [facility] but did not pick up his antibiotics.  On arrival to the ED patient was afebrile WBC 17.5..  He was started on IV vancomycin given his history of recurrent MRSA abscess.  Infectious disease consulted as etiology of these lesions on his back was unclear. Today, patient reports pain from his back wound continues.  Reports being adherent to Augmentin, last dose about 3 weeks ago.  He denies being on antibiotics prior to hospitalization.  He reports he noticed a bump in the middle of his I&D scar from last admission and then the erythema and tenderness spread across his back.   Review of Systems: Review of Systems  Constitutional: Negative.   HENT: Negative.    Eyes: Negative.   Respiratory: Negative.    Cardiovascular: Negative.   Gastrointestinal: Negative.   Genitourinary: Negative.   Musculoskeletal: Negative.   Skin:  Positive for rash.       Tenderness around back wound   Past Medical History:  Diagnosis Date   Above knee amputation status, right    Anxiety    Depression    Diabetes mellitus, type II (HCC)    Hyperlipemia    Hypertension    PTSD (post-traumatic stress disorder)    Shoulder pain     Social History   Tobacco Use   Smoking status: Former  Types: Cigarettes    Quit date: 08/22/2019    Years since quitting: 1.2   Smokeless tobacco: Never  Vaping Use   Vaping Use: Never used  Substance Use Topics   Alcohol use: Yes    Alcohol/week: 4.0 standard drinks    Types: 2 Cans of beer, 2 Shots of liquor per week    Comment: Reports light drinking weekly.   Drug use: Yes    Frequency: 14.0 times per week    Types: Marijuana    Comment: Uses 1-2 times a day  per report for his anxiety    Family History  Problem Relation Age of Onset   Aneurysm Mother    Leukemia Father    Dementia Maternal Aunt    Seizures Paternal Uncle    Scheduled Meds:  ARIPiprazole  10 mg Oral Daily   atorvastatin  80 mg Oral Daily   buPROPion  300 mg Oral Daily   busPIRone  15 mg Oral TID   carvedilol  25 mg Oral BID WC   enoxaparin (LOVENOX) injection  40 mg Subcutaneous Q24H   escitalopram  20 mg Oral Daily   insulin aspart  0-20 Units Subcutaneous TID WC   insulin aspart  0-5 Units Subcutaneous QHS   insulin aspart  15 Units Subcutaneous TID WC   insulin glargine-yfgn  25 Units Subcutaneous QHS   insulin glargine-yfgn  50 Units Subcutaneous q morning   lisinopril  40 mg Oral Daily   polyethylene glycol  17 g Oral Daily   senna-docusate  1 tablet Oral BID   Continuous Infusions:  cefTRIAXone (ROCEPHIN)  IV 2 g (12/01/20 1252)   vancomycin 1,000 mg (12/01/20 0655)   PRN Meds:.acetaminophen **OR** acetaminophen, hydrOXYzine, morphine injection, ondansetron **OR** ondansetron (ZOFRAN) IV, oxyCODONE, traZODone Allergies  Allergen Reactions   Ozempic (0.25 Or 0.5 Mg-Dose) [Semaglutide(0.25 Or 0.5mg -Dos)] Other (See Comments)    Caused Pancreatitis   Semaglutide Other (See Comments)    Caused pancreatitis   Lyrica [Pregabalin] Rash    OBJECTIVE: Blood pressure (!) 160/57, pulse (!) 108, temperature 98.3 F (36.8 C), temperature source Oral, resp. rate 18, height 6\' 2"  (1.88 m), weight 131.5 kg, SpO2 97 %.  Physical Exam Constitutional:      General: He is not in acute distress.    Appearance: He is normal weight. He is not toxic-appearing.  HENT:     Head: Normocephalic and atraumatic.     Right Ear: External ear normal.     Left Ear: External ear normal.     Nose: No congestion or rhinorrhea.     Mouth/Throat:     Mouth: Mucous membranes are moist.     Pharynx: Oropharynx is clear.  Eyes:     Extraocular Movements: Extraocular movements  intact.     Conjunctiva/sclera: Conjunctivae normal.     Pupils: Pupils are equal, round, and reactive to light.  Cardiovascular:     Rate and Rhythm: Normal rate and regular rhythm.     Heart sounds: No murmur heard.   No friction rub. No gallop.  Pulmonary:     Effort: Pulmonary effort is normal.     Breath sounds: Normal breath sounds.  Abdominal:     General: Abdomen is flat. Bowel sounds are normal.     Palpations: Abdomen is soft.  Musculoskeletal:        General: No swelling.     Cervical back: Normal range of motion and neck supple.     Comments: R-AKA  Skin:  General: Skin is warm and dry.     Comments: Erythema and tenderness extending around and across back from I&D site on 10/16/20. Multiple lesion on his back, crossing midline.   Neurological:     General: No focal deficit present.     Mental Status: He is oriented to person, place, and time.  Psychiatric:        Mood and Affect: Mood normal.    Lab Results Lab Results  Component Value Date   WBC 15.6 (H) 12/01/2020   HGB 10.6 (L) 12/01/2020   HCT 33.1 (L) 12/01/2020   MCV 86.9 12/01/2020   PLT 187 12/01/2020    Lab Results  Component Value Date   CREATININE 1.23 12/01/2020   BUN 21 (H) 12/01/2020   NA 132 (L) 12/01/2020   K 4.5 12/01/2020   CL 101 12/01/2020   CO2 20 (L) 12/01/2020    Lab Results  Component Value Date   ALT 15 10/14/2020   AST 12 (L) 10/14/2020   ALKPHOS 117 10/14/2020   BILITOT 0.6 10/14/2020       Danelle Earthly, MD [facility] 12/01/2020, 1:58 PM

## 2020-12-02 DIAGNOSIS — L03312 Cellulitis of back [any part except buttock]: Secondary | ICD-10-CM | POA: Diagnosis not present

## 2020-12-02 DIAGNOSIS — A419 Sepsis, unspecified organism: Secondary | ICD-10-CM | POA: Diagnosis not present

## 2020-12-02 DIAGNOSIS — F411 Generalized anxiety disorder: Secondary | ICD-10-CM

## 2020-12-02 DIAGNOSIS — L0390 Cellulitis, unspecified: Secondary | ICD-10-CM | POA: Diagnosis not present

## 2020-12-02 LAB — BASIC METABOLIC PANEL
Anion gap: 10 (ref 5–15)
BUN: 27 mg/dL — ABNORMAL HIGH (ref 6–20)
CO2: 24 mmol/L (ref 22–32)
Calcium: 8.9 mg/dL (ref 8.9–10.3)
Chloride: 103 mmol/L (ref 98–111)
Creatinine, Ser: 1.47 mg/dL — ABNORMAL HIGH (ref 0.61–1.24)
GFR, Estimated: 54 mL/min — ABNORMAL LOW (ref >60)
Glucose, Bld: 89 mg/dL (ref 70–99)
Potassium: 4.1 mmol/L (ref 3.5–5.1)
Sodium: 137 mmol/L (ref 135–145)

## 2020-12-02 LAB — CBC
HCT: 32.9 % — ABNORMAL LOW (ref 39.0–52.0)
Hemoglobin: 10.7 g/dL — ABNORMAL LOW (ref 13.0–17.0)
MCH: 27.9 pg (ref 26.0–34.0)
MCHC: 32.5 g/dL (ref 30.0–36.0)
MCV: 85.7 fL (ref 80.0–100.0)
Platelets: 216 10*3/uL (ref 150–400)
RBC: 3.84 MIL/uL — ABNORMAL LOW (ref 4.22–5.81)
RDW: 14.4 % (ref 11.5–15.5)
WBC: 17.8 10*3/uL — ABNORMAL HIGH (ref 4.0–10.5)
nRBC: 0 % (ref 0.0–0.2)

## 2020-12-02 LAB — GLUCOSE, CAPILLARY
Glucose-Capillary: 136 mg/dL — ABNORMAL HIGH (ref 70–99)
Glucose-Capillary: 181 mg/dL — ABNORMAL HIGH (ref 70–99)
Glucose-Capillary: 82 mg/dL (ref 70–99)
Glucose-Capillary: 112 mg/dL — ABNORMAL HIGH (ref 70–99)
Glucose-Capillary: 176 mg/dL — ABNORMAL HIGH (ref 70–99)

## 2020-12-02 MED ORDER — SULFAMETHOXAZOLE-TRIMETHOPRIM 800-160 MG PO TABS
2.0000 | ORAL_TABLET | Freq: Two times a day (BID) | ORAL | Status: DC
  Administered 2020-12-02: 2 via ORAL
  Filled 2020-12-02 (×2): qty 2

## 2020-12-02 MED ORDER — SODIUM CHLORIDE 0.9 % IV SOLN: INTRAVENOUS | Status: DC | PRN

## 2020-12-02 MED ORDER — HYDROMORPHONE HCL 1 MG/ML IJ SOLN
0.5000 mg | INTRAMUSCULAR | Status: DC | PRN
  Administered 2020-12-02 – 2020-12-08 (×20): 0.5 mg via INTRAVENOUS
  Filled 2020-12-02 (×20): qty 0.5

## 2020-12-02 MED ORDER — CLINDAMYCIN PHOSPHATE 1 % EX SOLN
Freq: Two times a day (BID) | CUTANEOUS | Status: DC
  Administered 2020-12-05 – 2020-12-06 (×3): 1 via TOPICAL
  Filled 2020-12-02 (×3): qty 60

## 2020-12-02 MED ORDER — OXYCODONE-ACETAMINOPHEN 5-325 MG PO TABS: 2.0000 | ORAL_TABLET | ORAL | Status: DC | PRN

## 2020-12-02 MED ORDER — OXYCODONE-ACETAMINOPHEN 5-325 MG PO TABS
1.0000 | ORAL_TABLET | ORAL | Status: DC | PRN
Start: 2020-12-02 — End: 2020-12-08
  Administered 2020-12-02 – 2020-12-07 (×17): 2 via ORAL
  Filled 2020-12-02 (×18): qty 2

## 2020-12-02 NOTE — Progress Notes (Signed)
PROGRESS NOTE  Mark [name] XEN:[id number] DOB: 04-24-1960 DOA: 11/30/2020 PCP: [name]   LOS: 1 day   Brief Narrative / Interim history: Mark [name] is a 60 y.o. male with medical history significant for insulin-dependent type 2 diabetes, hypertension, hyperlipidemia, OSA on CPAP, s/p right AKA, depression/anxiety/bipolar disorder, and recurrent cellulitis/abscesses of the buttocks who presented to the ED for evaluation of lower back cellulitis.  Patient was recently admitted 9/20 3-09/2025 2022 for buttock and pilonidal abscess, underwent incisional debridement on 9/25.  Wound cultures grew MRSA, speciated after his discharge.  He was sent home on Augmentin.  Few days ago he noted a new linear lesion on his lower back with multiple pustular and painful lesions the same area, clustered.  He has noticed small bloody drainage but the main thing was excruciating pain that he has had with his lesions and that is what prompted him to come to the ED  Subjective / 24h Interval events: Continues to have severe pain.  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 -He was placed on vancomycin -Cellulitic appearance seems to have improved today but he continues to have a cluster of pustular/vesicular lesions which are very tender to palpation -ID consulted, discussed with Dr. Thedore Mins, appreciate input, CT scan of the abdomen and pelvis without any significant drainable abscesses.  Consulted general surgery as well to evaluate, appreciate input  Active Problems Essential hypertension-continue home medications with Coreg, lisinopril  Depression/anxiety-continue home regimen  OSA-continue CPAP  IDDM, with hyperglycemia, poorly controlled  with A1c 9.9 - Continue glargine 15 in the morning and 20 5 in the evening along with 15 units of mealtime coverage plus resistant sliding scale  CBG (last 3)  Recent Labs    12/01/20 2047 12/02/20 0750 12/02/20 1208  GLUCAP 115* 136* 181*    Hypertension: Continue Coreg and lisinopril.   Hyperlipidemia: Continue atorvastatin.   Depression/anxiety/bipolar disorder: Continue Abilify, bupropion, buspirone, escitalopram.  Continue hydroxyzine as needed.   OSA: Continue CPAP nightly.  Scheduled Meds:  ARIPiprazole  10 mg Oral Daily   atorvastatin  80 mg Oral Daily   buPROPion  300 mg Oral Daily   busPIRone  15 mg Oral TID   carvedilol  25 mg Oral BID WC   enoxaparin (LOVENOX) injection  40 mg Subcutaneous Q24H   escitalopram  20 mg Oral Daily   insulin aspart  0-20 Units Subcutaneous TID WC   insulin aspart  0-5 Units Subcutaneous QHS   insulin aspart  15 Units Subcutaneous TID WC   insulin glargine-yfgn  25 Units Subcutaneous QHS   insulin glargine-yfgn  50 Units Subcutaneous q morning   lisinopril  40 mg Oral Daily   polyethylene glycol  17 g Oral Daily   senna-docusate  1 tablet Oral BID   valACYclovir  1,000 mg Oral TID   Continuous Infusions:  cefTRIAXone (ROCEPHIN)  IV 2 g (12/02/20 1234)   vancomycin 1,000 mg (12/02/20 0606)   PRN Meds:.HYDROmorphone (DILAUDID) injection, hydrOXYzine, ondansetron **OR** ondansetron (ZOFRAN) IV, oxyCODONE-acetaminophen, traZODone  Diet Orders (From admission, onward)     Start     Ordered   11/30/20 1928  Diet heart healthy/carb modified Room service appropriate? Yes; Fluid consistency: Thin  Diet effective now       Question  Answer Comment  Diet-HS Snack? Nothing   Room service appropriate? Yes   Fluid consistency: Thin      11/30/20 1927            DVT prophylaxis: enoxaparin (LOVENOX) injection 40 mg Start: 11/30/20 1945     Code Status: Full Code  Family Communication: No family at bedside  Status is:  Inpatient  Remains inpatient: Requiring IV pain medications, IV antibiotics  Level of care: Med-Surg  Consultants:  ID  Procedures:  none  Microbiology  Blood cultures 11/9-no growth to date  Antimicrobials: Vancomycin 11/9 Ceftriaxone 11/10   Objective: Vitals:   12/01/20 0817 12/01/20 1915 12/01/20 2024 12/02/20 0506  BP: (!) 160/57  (!) 105/58 123/64  Pulse: (!) 108 (!) 103 93 95  Resp: 18 17 20 16   Temp: 98.3 F (36.8 C)  97.7 F (36.5 C) 98.2 F (36.8 C)  TempSrc: Oral  Oral Oral  SpO2: 97% 98% 100% 99%  Weight:      Height:        Intake/Output Summary (Last 24 hours) at 12/02/2020 1258 Last data filed at 12/02/2020 1128 Gross per 24 hour  Intake 1950.06 ml  Output 250 ml  Net 1700.06 ml    Filed Weights   11/30/20 1525  Weight: 131.5 kg    Examination:  Constitutional: No distress Eyes: Anicteric  ENMT: Moist mucous membranes Neck: normal, supple Respiratory: CTA bilaterally, no wheezing Cardiovascular: Regular rate and rhythm, no murmurs Abdomen: Nondistended, bowel sounds positive Musculoskeletal: no clubbing / cyanosis.  Right AKA Skin: Rash similar to the picture yesterday Neurologic: No focal deficits  Data Reviewed: I have independently reviewed following labs and imaging studies   CBC: Recent Labs  Lab 11/30/20 1627 12/01/20 0315 12/02/20 0329  WBC 17.5* 15.6* 17.8*  NEUTROABS 14.4*  --   --   HGB 11.5* 10.6* 10.7*  HCT 35.4* 33.1* 32.9*  MCV 85.3 86.9 85.7  PLT 210 187 216    Basic Metabolic Panel: Recent Labs  Lab 11/30/20 1627 12/01/20 0315 12/02/20 0329  NA 130* 132* 137  K 4.2 4.5 4.1  CL 98 101 103  CO2 23 20* 24  GLUCOSE 326* 370* 89  BUN 20 21* 27*  CREATININE 1.26* 1.23 1.47*  CALCIUM 8.7* 8.6* 8.9    Liver Function Tests: No results for input(s): AST, ALT, ALKPHOS, BILITOT, PROT, ALBUMIN in the last 168 hours. Coagulation Profile: No results for input(s): INR, PROTIME in the last 168  hours. HbA1C: No results for input(s): HGBA1C in the last 72 hours. CBG: Recent Labs  Lab 12/01/20 1202 12/01/20 1648 12/01/20 2047 12/02/20 0750 12/02/20 1208  GLUCAP 362* 158* 115* 136* 181*     Recent Results (from the past 240 hour(s))  Culture, blood (routine x 2)     Status: None (Preliminary result)   Collection Time: 11/30/20  4:28 PM   Specimen: BLOOD LEFT FOREARM  Result Value Ref Range Status   Specimen Description   Final    BLOOD LEFT FOREARM Performed at [facility], [address], [place], [place]    Special Requests   Final    BOTTLES DRAWN AEROBIC AND ANAEROBIC Blood Culture results may not be optimal due to an inadequate volume of blood received in culture bottles Performed at [facility], [address] [place], [place]    Culture   Final    NO GROWTH 2 DAYS Performed at [facility], [address], [place], [place] [address]    Report Status PENDING  Incomplete  Culture, blood (routine x 2)     Status: None (Preliminary result)   Collection Time: 11/30/20  4:33 PM   Specimen: BLOOD  Result Value Ref Range Status   Specimen Description   Final    BLOOD LEFT ANTECUBITAL Performed at [facility], [address], [place], [place] [address]    Special Requests   Final    BOTTLES DRAWN AEROBIC AND ANAEROBIC Blood Culture results may not be optimal due to an inadequate volume of blood received in culture bottles Performed at [facility], [address], [place], [place] [address]    Culture   Final    NO GROWTH 2 DAYS Performed at [facility], [address] [place], [place] [address]    Report Status PENDING  Incomplete  Resp Panel by RT-PCR (Flu A&B, Covid) Nasopharyngeal Swab     Status: None   Collection Time: 11/30/20  6:23 PM   Specimen: Nasopharyngeal Swab; Nasopharyngeal(NP) swabs in vial transport medium  Result Value Ref Range  Status   SARS Coronavirus 2 by RT PCR NEGATIVE NEGATIVE Final    Comment: (NOTE) SARS-CoV-2 target nucleic acids are NOT DETECTED.  The SARS-CoV-2 RNA is generally detectable in upper respiratory specimens during the acute phase of infection. The lowest concentration of SARS-CoV-2 viral copies this assay can detect is 138 copies/mL. A negative result does not preclude SARS-Cov-2 infection and should not be used as the sole basis for treatment or other patient management decisions. A negative result may occur with  improper specimen collection/handling, submission of specimen other than nasopharyngeal swab, presence of viral mutation(s) within the areas targeted by this assay, and inadequate number of viral copies(<138 copies/mL). A negative result must be combined with clinical observations, patient history, and epidemiological information. The expected result is Negative.  Fact Sheet for Patients:  BloggerCourse.com  Fact Sheet for Healthcare Providers:  SeriousBroker.it  This test is no t yet approved or cleared by the [place] FDA and  has been authorized for detection and/or diagnosis of SARS-CoV-2 by FDA under an Emergency Use Authorization (EUA). This EUA will remain  in effect (meaning this test can be used) for the duration of the COVID-19 declaration under Section 564(b)(1) of the Act, 21 U.S.C.section 360bbb-3(b)(1), unless the authorization is terminated  or revoked sooner.       Influenza A by PCR NEGATIVE NEGATIVE Final   Influenza B by PCR NEGATIVE NEGATIVE Final    Comment: (NOTE) The Xpert Xpress SARS-CoV-2/FLU/RSV plus assay is intended as an aid in the diagnosis of influenza from Nasopharyngeal swab specimens and should not be used as a sole basis for treatment. Nasal washings and aspirates are unacceptable for Xpert Xpress SARS-CoV-2/FLU/RSV testing.  Fact Sheet for  Patients: BloggerCourse.com  Fact Sheet for Healthcare Providers: SeriousBroker.it  This test is not yet approved or cleared by the [place] FDA and has been authorized for detection and/or diagnosis of SARS-CoV-2 by FDA under an Emergency Use Authorization (EUA). This EUA will remain in effect (meaning this test can be used) for the duration of the COVID-19 declaration under Section 564(b)(1) of the Act, 21 U.S.C. section 360bbb-3(b)(1), unless the authorization is terminated or revoked.  Performed at [facility], [address] [place], [place] [address]       Radiology Studies: CT ABDOMEN PELVIS W CONTRAST  Result Date: 12/01/2020 CLINICAL DATA:  Low  back pain. EXAM: CT ABDOMEN AND PELVIS WITH CONTRAST TECHNIQUE: Multidetector CT imaging of the abdomen and pelvis was performed using the standard protocol following bolus administration of intravenous contrast. CONTRAST:  32mL OMNIPAQUE IOHEXOL 350 MG/ML SOLN COMPARISON:  October 14, 2020. FINDINGS: Lower chest: No acute abnormality. Hepatobiliary: Small gallstone is noted. No biliary dilatation is noted. The liver is unremarkable. Pancreas: Unremarkable. No pancreatic ductal dilatation or surrounding inflammatory changes. Spleen: Normal in size without focal abnormality. Adrenals/Urinary Tract: Adrenal glands appear normal. Small bilateral renal cysts are noted. No hydronephrosis or renal obstruction is noted. No renal or ureteral calculi are noted. Urinary bladder is unremarkable. Stomach/Bowel: Stomach is within normal limits. Appendix appears normal. No evidence of bowel wall thickening, distention, or inflammatory changes. Vascular/Lymphatic: Aortic atherosclerosis. Stable bilateral iliac and inguinal adenopathy is noted which most likely is reactive in etiology. Reproductive: Prostate is unremarkable. Other: No abdominal wall hernia. Stable ill-defined density  is noted in the subcutaneous tissues posterior to the cervical near the superior portion of the gluteal cleft. No abdominopelvic ascites. Musculoskeletal: No acute or significant osseous findings. IMPRESSION: Small gallstone. Stable bilateral iliac and inguinal adenopathy is noted which most likely is reactive in etiology. Stable ill-defined density is seen in the subcutaneous tissues posterior to the sacrum near the superior portion of the gluteal cleft. Possible abscess cannot be excluded. Aortic Atherosclerosis (ICD10-I70.0). Electronically Signed   By: Lupita Raider M.D.   On: 12/01/2020 17:43     Pamella Pert, MD, PhD [facility]  Between 7 am - 7 pm I am available, please contact me via Amion (for emergencies) or Securechat (non urgent messages)  Between 7 pm - 7 am I am not available, please contact night coverage MD/APP via Amion

## 2020-12-02 NOTE — Progress Notes (Signed)
Mark [name] [birth year]  [id number].    Requesting MD: Dr. Elvera Lennox  Chief Complaint/Reason for Consult: Possible back abscess  HPI: Mark [name] is a 60 y.o. male with a hx of HTN, HLD, DM2, R AKA who presented to the ED on 11/9 for lesion to his back.   Patient is known to our service.  He recent underwent a left inferior back abscess drainage by Dr. Carolynne Edouard on 9/25.  He was discharged home on Augmentin and completed this course.  Cultures came back with MRSA.  Patient followed up in the office on 10/18 and was doing well.  He reports he is doing daily WTD dressings and has no issues with his wound.  He reports that a new area on his central lower back began to hurt on Monday.  He describes as a constant, burning pain that is worse with palpation.  He reports clumps of small raised area draining bloody fluid with surrounding redness.  He feels like this is similar to when he has had abscesses in the past.  He went to a walk-in clinic at the [facility] and was prescribed antibiotics but reports that the pharmacy was closed before he could pick these up.  He presented to the ED for evaluation.  Patient is afebrile without any current tachycardia or hypotension.  Lactic 1.3.  WBC 15.6 > 17.8. CT w/ ill-defined density is seen in the subcutaneous tissues posterior to the sacrum near the superior portion of the gluteal Cleft. ID has seen and patient was started on abx (Ropcehin/Vanc) as well as Valtrex. We were asked to see to determine if the area needed any I&D. Patient just finished large breakfast tray upon [name] entering the room.   ROS: Review of Systems  Constitutional:  Negative for chills and fever.  Gastrointestinal:  Positive for nausea and vomiting. Negative for abdominal pain.  Skin:  Positive for rash.  All other systems reviewed and are negative.  Family History  Problem Relation Age of Onset   Aneurysm Mother    Leukemia Father    Dementia Maternal Aunt    Seizures Paternal Uncle      Past Medical History:  Diagnosis Date   Above knee amputation status, right    Anxiety    Depression    Diabetes mellitus, type II (HCC)    Hyperlipemia    Hypertension    PTSD (post-traumatic stress disorder)    Shoulder pain     Past Surgical History:  Procedure Laterality Date   HERNIA REPAIR     INCISION AND DRAINAGE Left 12/11/2019   Procedure: INCISION AND DRAINAGE POSTERIOR LEFT THIGH ABSCESS;  Surgeon: Axel Filler, MD;  Location: [facility] OR;  Service: General;  Laterality: Left;   INCISION AND DRAINAGE ABSCESS Left 12/11/2019   Procedure: INCISION AND DRAINAGE BACK ABSCESS,;  Surgeon: Axel Filler, MD;  Location: [facility] OR;  Service: General;  Laterality: Left;   INCISION AND DRAINAGE ABSCESS Left 10/16/2020   Procedure: INCISION AND DRAINAGE ABSCESS;  Surgeon: Griselda Miner, MD;  Location: [facility] ORS;  Service: General;  Laterality: Left;   LEG AMPUTATION Right    parenatal cyst      Social History:  reports that he quit smoking about 15 months ago. His smoking use included cigarettes. He has never used smokeless tobacco. He reports current alcohol use of about 4.0 standard drinks per week. He reports current drug use. Frequency: 14.00 times per week. Drug: Marijuana.  Allergies:  Allergies  Allergen Reactions  Ozempic (0.25 Or 0.5 Mg-Dose) [Semaglutide(0.25 Or 0.5mg -Dos)] Other (See Comments)    Caused Pancreatitis   Semaglutide Other (See Comments)    Caused pancreatitis   Lyrica [Pregabalin] Rash    Medications Prior to Admission  Medication Sig Dispense Refill   ARIPiprazole (ABILIFY) 10 MG tablet Take 10 mg by mouth daily.     atorvastatin (LIPITOR) 80 MG tablet Take 80 mg by mouth daily.     buPROPion (WELLBUTRIN XL) 300 MG 24 hr tablet Take 1 tablet (300 mg total) by mouth daily. 30 tablet 2   busPIRone (BUSPAR) 15 MG tablet Take 1 tablet (15 mg total) by mouth 3 (three) times daily. 90 tablet 2   carvedilol (COREG) 25 MG tablet Take 25 mg by mouth 2  (two) times daily with a meal.     cyclobenzaprine (FLEXERIL) 10 MG tablet Take 10 mg by mouth daily as needed for muscle spasms.     diclofenac Sodium (VOLTAREN) 1 % GEL Apply 2 g topically daily as needed (pain).     ergocalciferol (VITAMIN D2) 1.25 MG (50000 UT) capsule Take 50,000 Units by mouth once a week.     escitalopram (LEXAPRO) 20 MG tablet Take 20 mg by mouth daily.     hydrOXYzine (VISTARIL) 25 MG capsule Take 25-50 mg by mouth daily as needed for anxiety.     insulin aspart (NOVOLOG) 100 UNIT/ML injection Inject 35 Units into the skin 3 (three) times daily before meals.     insulin glargine (LANTUS) 100 UNIT/ML injection Inject 0.4 mLs (40 Units total) into the skin daily. (Patient taking differently: Inject 25-50 Units into the skin daily. Inject 50 units subcutaneous in the morning,  then inject  25 units subcutaneous at night per patient) 10 mL 11   lisinopril (ZESTRIL) 40 MG tablet Take 40 mg by mouth daily.     oxyCODONE-acetaminophen (PERCOCET/ROXICET) 5-325 MG tablet Take 2 tablets by mouth every 4 (four) hours as needed for severe pain.     pioglitazone (ACTOS) 15 MG tablet Take 15 mg by mouth daily.     sildenafil (VIAGRA) 100 MG tablet Take 100 mg by mouth daily as needed for erectile dysfunction.     traZODone (DESYREL) 50 MG tablet Take 1 tablet (50 mg total) by mouth at bedtime as needed. (Patient taking differently: Take 50 mg by mouth at bedtime as needed for sleep.) 30 tablet 2   prazosin (MINIPRESS) 2 MG capsule Take 1 capsule (2 mg total) by mouth at bedtime. (Patient not taking: Reported on 11/30/2020) 30 capsule 2     Physical Exam: Blood pressure 123/64, pulse 95, temperature 98.2 F (36.8 C), temperature source Oral, resp. rate 16, height  (1.88 m), weight 131.5 kg, SpO2 99 %. General: pleasant, WD/WN wmale who is laying in bed in NAD HEENT: head is normocephalic, atraumatic.  Sclera are noninjected.  PERRL.  Ears and nose without any masses or lesions.   Mouth is pink and moist. Dentition fair Heart: regular, rate, and rhythm.  Normal s1,s2. No obvious murmurs, gallops, or rubs noted.  Palpable pedal pulses bilaterally  Lungs: CTAB, no wheezes, rhonchi, or rales noted.  Respiratory effort nonlabored Abd: Soft, NT/ND, +BS, no masses, hernias, or organomegaly MS:R AKA. No LLE edema Psych: A&Ox4 with an appropriate affect Neuro: cranial nerves grossly intact, equal strength in BUE/BLE bilaterally, normal speech, thought process intact, moves all extremities, gait not assessed Skin: As noted below.   Global picture of the back   Multiple small  superficial raised lesions draining scant purulent material. There does not appear to be any underlying fluctuance. There is ~9 x 4cm of erythema, induration and warmth as seen in the picture below.   Prior left lower back I&D site that is healing well as noted below. No drainage. No surrounding induration, erythema or heat.      Results for orders placed or performed during the hospital encounter of 11/30/20 (from the past 48 hour(s))  CBC with Differential     Status: Abnormal   Collection Time: 11/30/20  4:27 PM  Result Value Ref Range   WBC 17.5 (H) 4.0 - 10.5 K/uL   RBC 4.15 (L) 4.22 - 5.81 MIL/uL   Hemoglobin 11.5 (L) 13.0 - 17.0 g/dL   HCT 16.1 (L) 09.6 - 04.5 %   MCV 85.3 80.0 - 100.0 fL   MCH 27.7 26.0 - 34.0 pg   MCHC 32.5 30.0 - 36.0 g/dL   RDW 40.9 81.1 - 91.4 %   Platelets 210 150 - 400 K/uL   nRBC 0.0 0.0 - 0.2 %   Neutrophils Relative % 82 %   Neutro Abs 14.4 (H) 1.7 - 7.7 K/uL   Lymphocytes Relative 9 %   Lymphs Abs 1.5 0.7 - 4.0 K/uL   Monocytes Relative 7 %   Monocytes Absolute 1.2 (H) 0.1 - 1.0 K/uL   Eosinophils Relative 1 %   Eosinophils Absolute 0.2 0.0 - 0.5 K/uL   Basophils Relative 0 %   Basophils Absolute 0.0 0.0 - 0.1 K/uL   Immature Granulocytes 1 %   Abs Immature Granulocytes 0.10 (H) 0.00 - 0.07 K/uL    Comment: Performed at [facility],  [address], [place], [place] [address]  Basic metabolic panel     Status: Abnormal   Collection Time: 11/30/20  4:27 PM  Result Value Ref Range   Sodium 130 (L) 135 - 145 mmol/L   Potassium 4.2 3.5 - 5.1 mmol/L   Chloride 98 98 - 111 mmol/L   CO2 23 22 - 32 mmol/L   Glucose, Bld 326 (H) 70 - 99 mg/dL    Comment: Glucose reference range applies only to samples taken after fasting for at least 8 hours.   BUN 20 6 - 20 mg/dL   Creatinine, Ser 6.21 (H) 0.61 - 1.24 mg/dL   Calcium 8.7 (L) 8.9 - 10.3 mg/dL   GFR, Estimated >30 >86 mL/min    Comment: (NOTE) Calculated using the CKD-EPI Creatinine Equation (2021)    Anion gap 9 5 - 15    Comment: Performed at [facility], [address] [place], [place] [address]  Procalcitonin - Baseline     Status: None   Collection Time: 11/30/20  4:27 PM  Result Value Ref Range   Procalcitonin 0.36 ng/mL    Comment:        Interpretation: PCT (Procalcitonin) <= 0.5 ng/mL: Systemic infection (sepsis) is not likely. Local bacterial infection is possible. (NOTE)       Sepsis PCT Algorithm           Lower Respiratory Tract                                      Infection PCT Algorithm    ----------------------------     ----------------------------         PCT < 0.25 ng/mL  PCT < 0.10 ng/mL          Strongly encourage             Strongly discourage   discontinuation of antibiotics    initiation of antibiotics    ----------------------------     -----------------------------       PCT 0.25 - 0.50 ng/mL            PCT 0.10 - 0.25 ng/mL               OR       >80% decrease in PCT            Discourage initiation of                                            antibiotics      Encourage discontinuation           of antibiotics    ----------------------------     -----------------------------         PCT >= 0.50 ng/mL              PCT 0.26 - 0.50 ng/mL               AND        <80% decrease in PCT              Encourage initiation of                                             antibiotics       Encourage continuation           of antibiotics    ----------------------------     -----------------------------        PCT >= 0.50 ng/mL                  PCT > 0.50 ng/mL               AND         increase in PCT                  Strongly encourage                                      initiation of antibiotics    Strongly encourage escalation           of antibiotics                                     -----------------------------                                           PCT <= 0.25 ng/mL                                                 OR                                        >  80% decrease in PCT                                      Discontinue / Do not initiate                                             antibiotics  Performed at [facility], [address], [place], [place] [address]   Culture, blood (routine x 2)     Status: None (Preliminary result)   Collection Time: 11/30/20  4:28 PM   Specimen: BLOOD LEFT FOREARM  Result Value Ref Range   Specimen Description      BLOOD LEFT FOREARM Performed at [facility], [address], [place], [place] [address]    Special Requests      BOTTLES DRAWN AEROBIC AND ANAEROBIC Blood Culture results may not be optimal due to an inadequate volume of blood received in culture bottles Performed at [facility], [address] [place], [place] [address]    Culture      NO GROWTH 2 DAYS Performed at [facility], [address], [place], [place] [address]    Report Status PENDING   Culture, blood (routine x 2)     Status: None (Preliminary result)   Collection Time: 11/30/20  4:33 PM   Specimen: BLOOD  Result Value Ref Range   Specimen Description      BLOOD LEFT ANTECUBITAL Performed at [facility], [address] [place], [place] [address]    Special  Requests      BOTTLES DRAWN AEROBIC AND ANAEROBIC Blood Culture results may not be optimal due to an inadequate volume of blood received in culture bottles Performed at [facility], [address], [place], [place] [address]    Culture      NO GROWTH 2 DAYS Performed at [facility], [address], [place], [place] [address]    Report Status PENDING   Lactic acid, plasma     Status: None   Collection Time: 11/30/20  4:48 PM  Result Value Ref Range   Lactic Acid, Venous 1.9 0.5 - 1.9 mmol/L    Comment: Performed at [facility], [address], [place], [place] [address]  Resp Panel by RT-PCR (Flu A&B, Covid) Nasopharyngeal Swab     Status: None   Collection Time: 11/30/20  6:23 PM   Specimen: Nasopharyngeal Swab; Nasopharyngeal(NP) swabs in vial transport medium  Result Value Ref Range   SARS Coronavirus 2 by RT PCR NEGATIVE NEGATIVE    Comment: (NOTE) SARS-CoV-2 target nucleic acids are NOT DETECTED.  The SARS-CoV-2 RNA is generally detectable in upper respiratory specimens during the acute phase of infection. The lowest concentration of SARS-CoV-2 viral copies this assay can detect is 138 copies/mL. A negative result does not preclude SARS-Cov-2 infection and should not be used as the sole basis for treatment or other patient management decisions. A negative result may occur with  improper specimen collection/handling, submission of specimen other than nasopharyngeal swab, presence of viral mutation(s) within the areas targeted by this assay, and inadequate number of viral copies(<138 copies/mL). A negative result must be combined with clinical observations, patient history, and epidemiological information. The expected result is Negative.  Fact Sheet for Patients:  BloggerCourse.com  Fact Sheet for Healthcare Providers:  SeriousBroker.it  This test is no t yet approved or cleared  by the [place] FDA and  has been authorized for detection and/or diagnosis of SARS-CoV-2 by FDA under an Emergency Use Authorization (EUA). This EUA will remain  in effect (meaning this test can be used) for the duration of the COVID-19 declaration under Section 564(b)(1) of the Act, 21 U.S.C.section 360bbb-3(b)(1), unless the authorization is terminated  or revoked sooner.       Influenza A by PCR NEGATIVE NEGATIVE   Influenza B by PCR NEGATIVE NEGATIVE    Comment: (NOTE) The Xpert Xpress SARS-CoV-2/FLU/RSV plus assay is intended as an aid in the diagnosis of influenza from Nasopharyngeal swab specimens and should not be used as a sole basis for treatment. Nasal washings and aspirates are unacceptable for Xpert Xpress SARS-CoV-2/FLU/RSV testing.  Fact Sheet for Patients: BloggerCourse.com  Fact Sheet for Healthcare Providers: SeriousBroker.it  This test is not yet approved or cleared by the [place] FDA and has been authorized for detection and/or diagnosis of SARS-CoV-2 by FDA under an Emergency Use Authorization (EUA). This EUA will remain in effect (meaning this test can be used) for the duration of the COVID-19 declaration under Section 564(b)(1) of the Act, 21 U.S.C. section 360bbb-3(b)(1), unless the authorization is terminated or revoked.  Performed at [facility], [address], [place], [place] [address]   Lactic acid, plasma     Status: None   Collection Time: 11/30/20  6:28 PM  Result Value Ref Range   Lactic Acid, Venous 1.3 0.5 - 1.9 mmol/L    Comment: Performed at [facility], [address], [place], [place] [address]  Glucose, capillary     Status: Abnormal   Collection Time: 11/30/20 10:27 PM  Result Value Ref Range   Glucose-Capillary 358 (H) 70 - 99 mg/dL    Comment: Glucose reference range applies only to samples taken after fasting for at least 8  hours.  CBC     Status: Abnormal   Collection Time: 12/01/20  3:15 AM  Result Value Ref Range   WBC 15.6 (H) 4.0 - 10.5 K/uL   RBC 3.81 (L) 4.22 - 5.81 MIL/uL   Hemoglobin 10.6 (L) 13.0 - 17.0 g/dL   HCT 09.8 (L) 11.9 - 14.7 %   MCV 86.9 80.0 - 100.0 fL   MCH 27.8 26.0 - 34.0 pg   MCHC 32.0 30.0 - 36.0 g/dL   RDW 82.9 56.2 - 13.0 %   Platelets 187 150 - 400 K/uL   nRBC 0.0 0.0 - 0.2 %    Comment: Performed at [facility], [address], [place], [place] [address]  Basic metabolic panel     Status: Abnormal   Collection Time: 12/01/20  3:15 AM  Result Value Ref Range   Sodium 132 (L) 135 - 145 mmol/L   Potassium 4.5 3.5 - 5.1 mmol/L   Chloride 101 98 - 111 mmol/L   CO2 20 (L) 22 - 32 mmol/L   Glucose, Bld 370 (H) 70 - 99 mg/dL    Comment: Glucose reference range applies only to samples taken after fasting for at least 8 hours.   BUN 21 (H) 6 - 20 mg/dL   Creatinine, Ser 4.69 0.61 - 1.24 mg/dL   Calcium 8.6 (L) 8.9 - 10.3 mg/dL   GFR, Estimated >62 >95 mL/min    Comment: (NOTE) Calculated using the  CKD-EPI Creatinine Equation (2021)    Anion gap 11 5 - 15    Comment: Performed at [facility], [address], [place], [place] [address]  Glucose, capillary     Status: Abnormal   Collection Time: 12/01/20  7:30 AM  Result Value Ref Range   Glucose-Capillary 380 (H) 70 - 99 mg/dL    Comment: Glucose reference range applies only to samples taken after fasting for at least 8 hours.  Glucose, capillary     Status: Abnormal   Collection Time: 12/01/20 12:02 PM  Result Value Ref Range   Glucose-Capillary 362 (H) 70 - 99 mg/dL    Comment: Glucose reference range applies only to samples taken after fasting for at least 8 hours.  Glucose, capillary     Status: Abnormal   Collection Time: 12/01/20  4:48 PM  Result Value Ref Range   Glucose-Capillary 158 (H) 70 - 99 mg/dL    Comment: Glucose reference range applies only to samples taken after  fasting for at least 8 hours.  Glucose, capillary     Status: Abnormal   Collection Time: 12/01/20  8:47 PM  Result Value Ref Range   Glucose-Capillary 115 (H) 70 - 99 mg/dL    Comment: Glucose reference range applies only to samples taken after fasting for at least 8 hours.  Basic metabolic panel     Status: Abnormal   Collection Time: 12/02/20  3:29 AM  Result Value Ref Range   Sodium 137 135 - 145 mmol/L   Potassium 4.1 3.5 - 5.1 mmol/L   Chloride 103 98 - 111 mmol/L   CO2 24 22 - 32 mmol/L   Glucose, Bld 89 70 - 99 mg/dL    Comment: Glucose reference range applies only to samples taken after fasting for at least 8 hours.   BUN 27 (H) 6 - 20 mg/dL   Creatinine, Ser 7.51 (H) 0.61 - 1.24 mg/dL   Calcium 8.9 8.9 - 70.0 mg/dL   GFR, Estimated 54 (L) >60 mL/min    Comment: (NOTE) Calculated using the CKD-EPI Creatinine Equation (2021)    Anion gap 10 5 - 15    Comment: Performed at [facility], [address], [place], [place] [address]  CBC     Status: Abnormal   Collection Time: 12/02/20  3:29 AM  Result Value Ref Range   WBC 17.8 (H) 4.0 - 10.5 K/uL   RBC 3.84 (L) 4.22 - 5.81 MIL/uL   Hemoglobin 10.7 (L) 13.0 - 17.0 g/dL   HCT 49.6 (L) 75.9 - 16.3 %   MCV 85.7 80.0 - 100.0 fL   MCH 27.9 26.0 - 34.0 pg   MCHC 32.5 30.0 - 36.0 g/dL   RDW 84.6 65.9 - 93.5 %   Platelets 216 150 - 400 K/uL   nRBC 0.0 0.0 - 0.2 %    Comment: Performed at [facility], [address] [place], [place] [address]  Glucose, capillary     Status: Abnormal   Collection Time: 12/02/20  7:50 AM  Result Value Ref Range   Glucose-Capillary 136 (H) 70 - 99 mg/dL    Comment: Glucose reference range applies only to samples taken after fasting for at least 8 hours.   CT ABDOMEN PELVIS W CONTRAST  Result Date: 12/01/2020 CLINICAL DATA:  Low back pain. EXAM: CT ABDOMEN AND PELVIS WITH CONTRAST TECHNIQUE: Multidetector CT imaging of the abdomen and pelvis was performed  using the standard protocol following bolus  administration of intravenous contrast. CONTRAST:  58mL OMNIPAQUE IOHEXOL 350 MG/ML SOLN COMPARISON:  October 14, 2020. FINDINGS: Lower chest: No acute abnormality. Hepatobiliary: Small gallstone is noted. No biliary dilatation is noted. The liver is unremarkable. Pancreas: Unremarkable. No pancreatic ductal dilatation or surrounding inflammatory changes. Spleen: Normal in size without focal abnormality. Adrenals/Urinary Tract: Adrenal glands appear normal. Small bilateral renal cysts are noted. No hydronephrosis or renal obstruction is noted. No renal or ureteral calculi are noted. Urinary bladder is unremarkable. Stomach/Bowel: Stomach is within normal limits. Appendix appears normal. No evidence of bowel wall thickening, distention, or inflammatory changes. Vascular/Lymphatic: Aortic atherosclerosis. Stable bilateral iliac and inguinal adenopathy is noted which most likely is reactive in etiology. Reproductive: Prostate is unremarkable. Other: No abdominal wall hernia. Stable ill-defined density is noted in the subcutaneous tissues posterior to the cervical near the superior portion of the gluteal cleft. No abdominopelvic ascites. Musculoskeletal: No acute or significant osseous findings. IMPRESSION: Small gallstone. Stable bilateral iliac and inguinal adenopathy is noted which most likely is reactive in etiology. Stable ill-defined density is seen in the subcutaneous tissues posterior to the sacrum near the superior portion of the gluteal cleft. Possible abscess cannot be excluded. Aortic Atherosclerosis (ICD10-I70.0). Electronically Signed   By: Lupita Raider M.D.   On: 12/01/2020 17:43    Anti-infectives (From admission, onward)    Start     Dose/Rate Route Frequency Ordered Stop   12/01/20 1600  valACYclovir (VALTREX) tablet 1,000 mg        1,000 mg Oral 3 times daily 12/01/20 1453     12/01/20 1230  cefTRIAXone (ROCEPHIN) 2 g in sodium chloride 0.9 % 100  mL IVPB        2 g 200 mL/hr over 30 Minutes Intravenous Every 24 hours 12/01/20 1134     12/01/20 0600  vancomycin (VANCOCIN) IVPB 1000 mg/200 mL premix        1,000 mg 200 mL/hr over 60 Minutes Intravenous Every 12 hours 11/30/20 2037     11/30/20 1815  vancomycin (VANCOREADY) IVPB 2000 mg/400 mL        2,000 mg 200 mL/hr over 120 Minutes Intravenous  Once 11/30/20 1804 11/30/20 2103       Assessment/Plan Lower back cellulitis - There is no obvious undrained fluid collection on exam or CT. No current indication for surgery/drainage.  - Will make NPO at midnight and plan to recheck in the AM to ensure this does not evolve into anything that requires drainage in the OR - Abx per ID (Ropcehin/Vanc). They are also covering with Valtrex.  Hx of left inferior back abscess drainage by Dr. Carolynne Edouard on 9/25  - Healing well. Continue WTD dressing changes.   FEN - HH. NPO at midnight  VTE - SCDs ID - Rocephin/Vanc. Valtrex. After discussion with MD will plan to add topical clinda - will reach out to pharmacy to see if we have this   IDDM2 HTN HLD R AKA  Jacinto Halim, [facility] 12/02/2020, 11:43 AM Please see Amion for pager number during day hours 7:00am-4:30pm

## 2020-12-02 NOTE — Progress Notes (Signed)
[facility]  Date of Admission:  11/30/2020   Total days of antibiotics 3  Principal Problem:   Sepsis due to cellulitis [facility]) Active Problems:   Major depressive disorder, recurrent episode, moderate (HCC)   Generalized anxiety disorder   Hypertension associated with diabetes (HCC)   Hyperlipidemia associated with type 2 diabetes mellitus (HCC)   Cellulitis of lower back   Cellulitis          Assessment: 60 year old male with poorly controlled diabetes (A1c 9.9 on 10/15/20), pilondial cyst, recurrent abscesses with MRSA, recently admitted 9/25-9/27 for abscess on his back which was I&D subsequenly discharged on Augmentin prior to cultures  returning positive for MRSA presented with new infection on his back.  He was started on vancomycin and ID consulted for antibiotic recommendations.  #Cellulitis of back #Poorly controlled diabetes #Leukocytosis -Patient showed a photo of where his edema and erythema  started on his back and it is midline of an old I&D site. CT did show evidence of an abscess.  -He does have a lesion on his back that has unroofed as such would like HSV and VZV swab for confirmation. Suspect that he has a MRSA skin/soft tissue infection as he is colonized with HSV superimposed. -As erythema has significantly decreased today will transition to PO doxy and continue valtrex  Recommendations:  -D/C vancomycin and ceftriaxone -Start TMP-SMX and continue valtrex to complete 10 days of antibiotics for skin soft tissue infection (end date 11/19) -Please obtain Cx, HSV and VZV PCR of unroofed lesion on back -Follow-up in ID clinic  ID will sign off     Microbiology:  Antibiotics: Vancomycin  11/9-present Ceftriaxone 11/10-11/11 Cultures: 11/09 Blood Cx NGTD  SUBJECTIVE: Pt is resting in bed, Afebrile overnight. Reports back feels better.   Review of Systems: Review of Systems  All other systems reviewed and are  negative.   Scheduled Meds:  ARIPiprazole  10 mg Oral Daily   atorvastatin  80 mg Oral Daily   buPROPion  300 mg Oral Daily   busPIRone  15 mg Oral TID   carvedilol  25 mg Oral BID WC   clindamycin   Topical BID   enoxaparin (LOVENOX) injection  40 mg Subcutaneous Q24H   escitalopram  20 mg Oral Daily   insulin aspart  0-20 Units Subcutaneous TID WC   insulin aspart  0-5 Units Subcutaneous QHS   insulin aspart  15 Units Subcutaneous TID WC   insulin glargine-yfgn  25 Units Subcutaneous QHS   insulin glargine-yfgn  50 Units Subcutaneous q morning   lisinopril  40 mg Oral Daily   polyethylene glycol  17 g Oral Daily   senna-docusate  1 tablet Oral BID   sulfamethoxazole-trimethoprim  2 tablet Oral Q12H   valACYclovir  1,000 mg Oral TID   Continuous Infusions:  sodium chloride 10 mL/hr at 12/02/20 2223   PRN Meds:.sodium chloride, HYDROmorphone (DILAUDID) injection, hydrOXYzine, ondansetron **OR** ondansetron (ZOFRAN) IV, oxyCODONE-acetaminophen, traZODone Allergies  Allergen Reactions   Ozempic (0.25 Or 0.5 Mg-Dose) [Semaglutide(0.25 Or 0.5mg -Dos)] Other (See Comments)    Caused Pancreatitis   Semaglutide Other (See Comments)    Caused pancreatitis   Lyrica [Pregabalin] Rash    OBJECTIVE: Vitals:   12/01/20 2024 12/02/20 0506 12/02/20 1358 12/02/20 2109  BP: (!) 105/58 123/64 (!) 112/53 (!) 103/56  Pulse: 93 95 85 86  Resp: 20 16 18 18   Temp: 97.7 F (36.5 C) 98.2 F (36.8 C)  98.8 F (37.1 C)  TempSrc: Oral Oral  Oral  SpO2: 100% 99% 98% 99%  Weight:      Height:       Body mass index is 37.23 kg/m.  Physical Exam Constitutional:      General: He is not in acute distress.    Appearance: He is normal weight. He is not toxic-appearing.  HENT:     Head: Normocephalic and atraumatic.     Right Ear: External ear normal.     Left Ear: External ear normal.     Nose: No congestion or rhinorrhea.     Mouth/Throat:     Mouth: Mucous membranes are moist.      Pharynx: Oropharynx is clear.  Eyes:     Extraocular Movements: Extraocular movements intact.     Conjunctiva/sclera: Conjunctivae normal.     Pupils: Pupils are equal, round, and reactive to light.  Cardiovascular:     Rate and Rhythm: Normal rate and regular rhythm.     Heart sounds: No murmur heard.   No friction rub. No gallop.  Pulmonary:     Effort: Pulmonary effort is normal.     Breath sounds: Normal breath sounds.  Abdominal:     General: Abdomen is flat. Bowel sounds are normal.     Palpations: Abdomen is soft.  Musculoskeletal:        General: No swelling.     Cervical back: Normal range of motion and neck supple.     Comments: R-AKA  Skin:    General: Skin is warm and dry.     Comments: Erythema and tenderness extending around and across back from I&D site on 10/16/20. Multiple lesion on his back, crossing midline.    Neurological:     General: No focal deficit present.     Mental Status: He is oriented to person, place, and time.  Psychiatric:        Mood and Affect: Mood normal.      Lab Results Lab Results  Component Value Date   WBC 17.8 (H) 12/02/2020   HGB 10.7 (L) 12/02/2020   HCT 32.9 (L) 12/02/2020   MCV 85.7 12/02/2020   PLT 216 12/02/2020    Lab Results  Component Value Date   CREATININE 1.47 (H) 12/02/2020   BUN 27 (H) 12/02/2020   NA 137 12/02/2020   K 4.1 12/02/2020   CL 103 12/02/2020   CO2 24 12/02/2020    Lab Results  Component Value Date   ALT 15 10/14/2020   AST 12 (L) 10/14/2020   ALKPHOS 117 10/14/2020   BILITOT 0.6 10/14/2020        Danelle Earthly, MD [facility] 12/02/2020, 11:53 PM

## 2020-12-03 DIAGNOSIS — L0390 Cellulitis, unspecified: Secondary | ICD-10-CM | POA: Diagnosis not present

## 2020-12-03 DIAGNOSIS — L03312 Cellulitis of back [any part except buttock]: Secondary | ICD-10-CM | POA: Diagnosis not present

## 2020-12-03 DIAGNOSIS — F411 Generalized anxiety disorder: Secondary | ICD-10-CM | POA: Diagnosis not present

## 2020-12-03 DIAGNOSIS — A419 Sepsis, unspecified organism: Secondary | ICD-10-CM | POA: Diagnosis not present

## 2020-12-03 LAB — COMPREHENSIVE METABOLIC PANEL
ALT: 27 U/L (ref 0–44)
AST: 37 U/L (ref 15–41)
Albumin: 2.9 g/dL — ABNORMAL LOW (ref 3.5–5.0)
Alkaline Phosphatase: 126 U/L (ref 38–126)
Anion gap: 11 (ref 5–15)
BUN: 38 mg/dL — ABNORMAL HIGH (ref 6–20)
CO2: 20 mmol/L — ABNORMAL LOW (ref 22–32)
Calcium: 8.9 mg/dL (ref 8.9–10.3)
Chloride: 104 mmol/L (ref 98–111)
Creatinine, Ser: 1.64 mg/dL — ABNORMAL HIGH (ref 0.61–1.24)
GFR, Estimated: 48 mL/min — ABNORMAL LOW (ref >60)
Glucose, Bld: 102 mg/dL — ABNORMAL HIGH (ref 70–99)
Potassium: 5.3 mmol/L — ABNORMAL HIGH (ref 3.5–5.1)
Sodium: 135 mmol/L (ref 135–145)
Total Bilirubin: 0.3 mg/dL (ref 0.3–1.2)
Total Protein: 7.3 g/dL (ref 6.5–8.1)

## 2020-12-03 LAB — GLUCOSE, CAPILLARY
Glucose-Capillary: 96 mg/dL (ref 70–99)
Glucose-Capillary: 50 mg/dL — ABNORMAL LOW (ref 70–99)
Glucose-Capillary: 50 mg/dL — ABNORMAL LOW (ref 70–99)
Glucose-Capillary: 55 mg/dL — ABNORMAL LOW (ref 70–99)
Glucose-Capillary: 99 mg/dL (ref 70–99)
Glucose-Capillary: 246 mg/dL — ABNORMAL HIGH (ref 70–99)
Glucose-Capillary: 220 mg/dL — ABNORMAL HIGH (ref 70–99)

## 2020-12-03 LAB — CBC
HCT: 32.6 % — ABNORMAL LOW (ref 39.0–52.0)
Hemoglobin: 10.7 g/dL — ABNORMAL LOW (ref 13.0–17.0)
MCH: 28.1 pg (ref 26.0–34.0)
MCHC: 32.8 g/dL (ref 30.0–36.0)
MCV: 85.6 fL (ref 80.0–100.0)
Platelets: 196 10*3/uL (ref 150–400)
RBC: 3.81 MIL/uL — ABNORMAL LOW (ref 4.22–5.81)
RDW: 14.8 % (ref 11.5–15.5)
WBC: 15.1 10*3/uL — ABNORMAL HIGH (ref 4.0–10.5)
nRBC: 0.1 % (ref 0.0–0.2)

## 2020-12-03 MED ORDER — SODIUM CHLORIDE 0.9 % IV BOLUS
1000.0000 mL | Freq: Once | INTRAVENOUS | Status: AC
  Administered 2020-12-03: 1000 mL via INTRAVENOUS

## 2020-12-03 MED ORDER — INSULIN ASPART 100 UNIT/ML IJ SOLN
5.0000 [IU] | Freq: Three times a day (TID) | INTRAMUSCULAR | Status: DC
  Administered 2020-12-03 – 2020-12-08 (×13): 5 [IU] via SUBCUTANEOUS

## 2020-12-03 MED ORDER — SULFAMETHOXAZOLE-TRIMETHOPRIM 800-160 MG PO TABS
1.0000 | ORAL_TABLET | Freq: Two times a day (BID) | ORAL | Status: DC
  Administered 2020-12-03: 1 via ORAL
  Filled 2020-12-03 (×2): qty 1

## 2020-12-03 MED ORDER — DOXYCYCLINE HYCLATE 100 MG PO TABS
100.0000 mg | ORAL_TABLET | Freq: Two times a day (BID) | ORAL | Status: DC
  Administered 2020-12-03 – 2020-12-08 (×10): 100 mg via ORAL
  Filled 2020-12-03 (×11): qty 1

## 2020-12-03 MED ORDER — INSULIN GLARGINE-YFGN 100 UNIT/ML ~~LOC~~ SOLN
20.0000 [IU] | Freq: Every day | SUBCUTANEOUS | Status: DC
  Administered 2020-12-03: 20 [IU] via SUBCUTANEOUS
  Filled 2020-12-03: qty 0.2

## 2020-12-03 MED ORDER — GLUCOSE 40 % PO GEL
ORAL | Status: AC
  Filled 2020-12-03: qty 1.21

## 2020-12-03 MED ORDER — INSULIN GLARGINE-YFGN 100 UNIT/ML ~~LOC~~ SOLN
35.0000 [IU] | Freq: Every morning | SUBCUTANEOUS | Status: DC
Start: 2020-12-04 — End: 2020-12-08
  Administered 2020-12-04 – 2020-12-08 (×5): 35 [IU] via SUBCUTANEOUS
  Filled 2020-12-03 (×5): qty 0.35

## 2020-12-03 NOTE — Plan of Care (Signed)
  Problem: Health Behavior/Discharge Planning: Goal: Ability to manage health-related needs will improve Outcome: Progressing   Problem: Activity: Goal: Risk for activity intolerance will decrease Outcome: Progressing   Problem: Pain Managment: Goal: General experience of comfort will improve Outcome: Progressing   Problem: Safety: Goal: Ability to remain free from injury will improve Outcome: Progressing   

## 2020-12-03 NOTE — Plan of Care (Signed)
  Problem: Coping: Goal: Level of anxiety will decrease Outcome: Progressing   Problem: Pain Managment: Goal: General experience of comfort will improve Outcome: Progressing   Problem: Clinical Measurements: Goal: Ability to avoid or minimize complications of infection will improve Outcome: Progressing

## 2020-12-03 NOTE — Progress Notes (Signed)
Subjective/Chief Complaint: Complains of soreness   Objective: Vital signs in last 24 hours: Temp:  [98.6 F (37 C)-98.8 F (37.1 C)] 98.6 F (37 C) (11/12 0507) Pulse Rate:  [85-88] 88 (11/12 0507) Resp:  [18] 18 (11/12 0507) BP: (103-139)/(53-68) 139/68 (11/12 0507) SpO2:  [98 %-99 %] 99 % (11/12 0507) Last BM Date: 11/30/20  Intake/Output from previous day: 11/11 0701 - 11/12 0700 In: 556 [P.O.:480; I.V.:76] Out: 500 [Urine:500] Intake/Output this shift: No intake/output data recorded.  General appearance: alert and cooperative Back: cellulitis not very impressive. No drainage Resp: clear to auscultation bilaterally Cardio: regular rate and rhythm GI: soft, non-tender; bowel sounds normal; no masses,  no organomegaly  Lab Results:  Recent Labs    12/02/20 0329 12/03/20 0413  WBC 17.8* 15.1*  HGB 10.7* 10.7*  HCT 32.9* 32.6*  PLT 216 196   BMET Recent Labs    12/02/20 0329 12/03/20 0413  NA 137 135  K 4.1 5.3*  CL 103 104  CO2 24 20*  GLUCOSE 89 102*  BUN 27* 38*  CREATININE 1.47* 1.64*  CALCIUM 8.9 8.9   PT/INR No results for input(s): LABPROT, INR in the last 72 hours. ABG No results for input(s): PHART, HCO3 in the last 72 hours.  Invalid input(s): PCO2, PO2  Studies/Results: CT ABDOMEN PELVIS W CONTRAST  Result Date: 12/01/2020 CLINICAL DATA:  Low back pain. EXAM: CT ABDOMEN AND PELVIS WITH CONTRAST TECHNIQUE: Multidetector CT imaging of the abdomen and pelvis was performed using the standard protocol following bolus administration of intravenous contrast. CONTRAST:  80mL OMNIPAQUE IOHEXOL 350 MG/ML SOLN COMPARISON:  October 14, 2020. FINDINGS: Lower chest: No acute abnormality. Hepatobiliary: Small gallstone is noted. No biliary dilatation is noted. The liver is unremarkable. Pancreas: Unremarkable. No pancreatic ductal dilatation or surrounding inflammatory changes. Spleen: Normal in size without focal abnormality. Adrenals/Urinary  Tract: Adrenal glands appear normal. Small bilateral renal cysts are noted. No hydronephrosis or renal obstruction is noted. No renal or ureteral calculi are noted. Urinary bladder is unremarkable. Stomach/Bowel: Stomach is within normal limits. Appendix appears normal. No evidence of bowel wall thickening, distention, or inflammatory changes. Vascular/Lymphatic: Aortic atherosclerosis. Stable bilateral iliac and inguinal adenopathy is noted which most likely is reactive in etiology. Reproductive: Prostate is unremarkable. Other: No abdominal wall hernia. Stable ill-defined density is noted in the subcutaneous tissues posterior to the cervical near the superior portion of the gluteal cleft. No abdominopelvic ascites. Musculoskeletal: No acute or significant osseous findings. IMPRESSION: Small gallstone. Stable bilateral iliac and inguinal adenopathy is noted which most likely is reactive in etiology. Stable ill-defined density is seen in the subcutaneous tissues posterior to the sacrum near the superior portion of the gluteal cleft. Possible abscess cannot be excluded. Aortic Atherosclerosis (ICD10-I70.0). Electronically Signed   By: Lupita Raider M.D.   On: 12/01/2020 17:43    Anti-infectives: Anti-infectives (From admission, onward)    Start     Dose/Rate Route Frequency Ordered Stop   12/03/20 1000  sulfamethoxazole-trimethoprim (BACTRIM DS) 800-160 MG per tablet 1 tablet        1 tablet Oral Every 12 hours 12/03/20 0706 12/11/20 0959   12/02/20 2200  sulfamethoxazole-trimethoprim (BACTRIM DS) 800-160 MG per tablet 2 tablet  Status:  Discontinued        2 tablet Oral Every 12 hours 12/02/20 1530 12/03/20 0707   12/01/20 1600  valACYclovir (VALTREX) tablet 1,000 mg        1,000 mg Oral 3 times daily 12/01/20 1453  12/10/20 2359   12/01/20 1230  cefTRIAXone (ROCEPHIN) 2 g in sodium chloride 0.9 % 100 mL IVPB  Status:  Discontinued        2 g 200 mL/hr over 30 Minutes Intravenous Every 24 hours  12/01/20 1134 12/02/20 1530   12/01/20 0600  vancomycin (VANCOCIN) IVPB 1000 mg/200 mL premix  Status:  Discontinued        1,000 mg 200 mL/hr over 60 Minutes Intravenous Every 12 hours 11/30/20 2037 12/02/20 1530   11/30/20 1815  vancomycin (VANCOREADY) IVPB 2000 mg/400 mL        2,000 mg 200 mL/hr over 120 Minutes Intravenous  Once 11/30/20 1804 11/30/20 2103       Assessment/Plan: s/p * No surgery found * Advance diet Continue IV abx. Wbc improving Will monitor for needs for I and D  LOS: 2 days    Chevis Pretty III 12/03/2020

## 2020-12-03 NOTE — Progress Notes (Addendum)
PROGRESS NOTE  Mark [name] [id number] DOB: 1960/10/19 DOA: 11/30/2020 PCP: [facility]   LOS: 2 days   Brief Narrative / Interim history: Mark [name] is a 60 y.o. male with medical history significant for insulin-dependent type 2 diabetes, hypertension, hyperlipidemia, OSA on CPAP, s/p right AKA, depression/anxiety/bipolar disorder, and recurrent cellulitis/abscesses of the buttocks who presented to the ED for evaluation of lower back cellulitis.  Patient was recently admitted 9/20 3-09/2025 2022 for buttock and pilonidal abscess, underwent incisional debridement on 9/25.  Wound cultures grew MRSA, speciated after his discharge.  He was sent home on Augmentin.  Few days ago he noted a new linear lesion on his lower back with multiple pustular and painful lesions the same area, clustered.  He has noticed small bloody drainage but the main thing was excruciating pain that he has had with his lesions and that is what prompted him to come to the ED  Subjective / 24h Interval events: He tells me pain is a little bit better  Assessment & Plan: Principal Problem Lower back cellulitis, sepsis-he presented and was admitted to the hospital with leukocytosis, tachycardia, and a source of infection. -He was placed on vancomycin, ID consulted, appreciate input.  Now on Bactrim as well as Valtrex -Cellulitic appearance seems to have improved today but he continues to have a cluster of pustular/vesicular lesions which are very tender to palpation.  General surgery following for potential I&D -Send cultures, viral PCR today  Active Problems Essential hypertension-continue home medications with Coreg, lisinopril  Depression/anxiety-continue home regimen  OSA-continue CPAP  IDDM, with hyperglycemia, poorly controlled with A1c 9.9 - Continue glargine 15 in the morning and 20 5 in the evening along with 15 units of mealtime coverage plus resistant sliding scale.  Had an episode of hypoglycemia  today, decrease mealtime insulin as well as long-acting  CBG (last 3)  Recent Labs    12/03/20 0737 12/03/20 1104 12/03/20 1114  GLUCAP 96 50* 55*    Chronic kidney disease stage IIIa-Baseline creatinine 1.2-1.4, currently around 1.6.  Seems a bit dehydrated, will give fluids.  May need to readdress Bactrim if creatinine continues to go up tomorrow  Hypertension -Continue Coreg and lisinopril.   Hyperlipidemia -Continue atorvastatin.   Depression/anxiety/bipolar disorder -Continue Abilify, bupropion, buspirone, escitalopram.  Continue hydroxyzine as needed.   OSA: Continue CPAP nightly.  Scheduled Meds:  ARIPiprazole  10 mg Oral Daily   atorvastatin  80 mg Oral Daily   buPROPion  300 mg Oral Daily   busPIRone  15 mg Oral TID   carvedilol  25 mg Oral BID WC   clindamycin   Topical BID   enoxaparin (LOVENOX) injection  40 mg Subcutaneous Q24H   escitalopram  20 mg Oral Daily   insulin aspart  0-20 Units Subcutaneous TID WC   insulin aspart  0-5 Units Subcutaneous QHS   insulin aspart  15 Units Subcutaneous TID WC   insulin glargine-yfgn  25 Units Subcutaneous QHS   insulin glargine-yfgn  50 Units Subcutaneous q morning   lisinopril  40 mg Oral Daily   polyethylene glycol  17 g Oral Daily   senna-docusate  1 tablet Oral BID   sulfamethoxazole-trimethoprim  1 tablet Oral Q12H   valACYclovir  1,000 mg Oral TID   Continuous Infusions:  sodium chloride 10 mL/hr at 12/02/20 2223   PRN Meds:.sodium chloride, HYDROmorphone (DILAUDID) injection, hydrOXYzine, ondansetron **OR** ondansetron (ZOFRAN) IV, oxyCODONE-acetaminophen, traZODone  Diet Orders (From admission, onward)     Start  Ordered   12/03/20 1058  Diet Carb Modified Fluid consistency: Thin; Room service appropriate? Yes  Diet effective now       Question Answer Comment  Diet-HS Snack? Nothing   Calorie Level Medium 1600-2000   Fluid consistency: Thin   Room service appropriate? Yes      12/03/20 1058             DVT prophylaxis: enoxaparin (LOVENOX) injection 40 mg Start: 11/30/20 1945     Code Status: Full Code  Family Communication: No family at bedside  Status is: Inpatient  Remains inpatient: Requiring IV pain medications, IV antibiotics  Level of care: Med-Surg  Consultants:  ID  Procedures:  none  Microbiology  Blood cultures 11/9-no growth to date  Antimicrobials: Vancomycin 11/9 Ceftriaxone 11/10   Objective: Vitals:   12/02/20 0506 12/02/20 1358 12/02/20 2109 12/03/20 0507  BP: 123/64 (!) 112/53 (!) 103/56 139/68  Pulse: 95 85 86 88  Resp: 16 18 18 18   Temp: 98.2 F (36.8 C)  98.8 F (37.1 C) 98.6 F (37 C)  TempSrc: Oral  Oral Oral  SpO2: 99% 98% 99% 99%  Weight:      Height:        Intake/Output Summary (Last 24 hours) at 12/03/2020 1140 Last data filed at 12/03/2020 1053 Gross per 24 hour  Intake 484.54 ml  Output 300 ml  Net 184.54 ml    Filed Weights   11/30/20 1525  Weight: 131.5 kg    Examination:  Constitutional: NAD Eyes: No scleral icterus ENMT: mmm Neck: normal, supple Respiratory: CTA Cardiovascular: Regular Abdomen: ND, BS + Musculoskeletal: no clubbing / cyanosis.  Right AKA Skin: Rash similar, no new changes Neurologic: Nonfocal  Data Reviewed: I have independently reviewed following labs and imaging studies   CBC: Recent Labs  Lab 11/30/20 1627 12/01/20 0315 12/02/20 0329 12/03/20 0413  WBC 17.5* 15.6* 17.8* 15.1*  NEUTROABS 14.4*  --   --   --   HGB 11.5* 10.6* 10.7* 10.7*  HCT 35.4* 33.1* 32.9* 32.6*  MCV 85.3 86.9 85.7 85.6  PLT 210 187 216 [id number]    Basic Metabolic Panel: Recent Labs  Lab 11/30/20 1627 12/01/20 0315 12/02/20 0329 12/03/20 0413  NA 130* 132* 137 135  K 4.2 4.5 4.1 5.3*  CL 98 101 103 104  CO2 23 20* 24 20*  GLUCOSE 326* 370* 89 102*  BUN 20 21* 27* 38*  CREATININE 1.26* 1.23 1.47* 1.64*  CALCIUM 8.7* 8.6* 8.9 8.9    Liver Function Tests: Recent Labs  Lab  12/03/20 0413  AST 37  ALT 27  ALKPHOS 126  BILITOT 0.3  PROT 7.3  ALBUMIN 2.9*   Coagulation Profile: No results for input(s): INR, PROTIME in the last 168 hours. HbA1C: No results for input(s): HGBA1C in the last 72 hours. CBG: Recent Labs  Lab 12/02/20 1927 12/02/20 2110 12/03/20 0737 12/03/20 1104 12/03/20 1114  GLUCAP 112* 176* 96 50* 55*     Recent Results (from the past 240 hour(s))  Culture, blood (routine x 2)     Status: None (Preliminary result)   Collection Time: 11/30/20  4:28 PM   Specimen: BLOOD LEFT FOREARM  Result Value Ref Range Status   Specimen Description   Final    BLOOD LEFT FOREARM Performed at [facility], [place] [address] [place], [place] [address]    Special Requests   Final    BOTTLES DRAWN AEROBIC AND ANAEROBIC Blood Culture results may not  be optimal due to an inadequate volume of blood received in culture bottles Performed at [place] [address] [place], [place] [address]    Culture   Final    NO GROWTH 3 DAYS Performed at [facility], [place] [address] [place], [place] [address]    Report Status PENDING  Incomplete  Culture, blood (routine x 2)     Status: None (Preliminary result)   Collection Time: 11/30/20  4:33 PM   Specimen: BLOOD  Result Value Ref Range Status   Specimen Description   Final    BLOOD LEFT ANTECUBITAL Performed at [place] [address], [place], [place] [address]    Special Requests   Final    BOTTLES DRAWN AEROBIC AND ANAEROBIC Blood Culture results may not be optimal due to an inadequate volume of blood received in culture bottles Performed at [place] [address], [place], [place] [address]    Culture   Final    NO GROWTH 3 DAYS Performed at [facility], [place] [address] [place], [place] [address]    Report Status PENDING  Incomplete  Resp Panel by RT-PCR (Flu A&B, Covid) Nasopharyngeal Swab      Status: None   Collection Time: 11/30/20  6:23 PM   Specimen: Nasopharyngeal Swab; Nasopharyngeal(NP) swabs in vial transport medium  Result Value Ref Range Status   SARS Coronavirus 2 by RT PCR NEGATIVE NEGATIVE Final    Comment: (NOTE) SARS-CoV-2 target nucleic acids are NOT DETECTED.  The SARS-CoV-2 RNA is generally detectable in upper respiratory specimens during the acute phase of infection. The lowest concentration of SARS-CoV-2 viral copies this assay can detect is 138 copies/mL. A negative result does not preclude SARS-Cov-2 infection and should not be used as the sole basis for treatment or other patient management decisions. A negative result may occur with  improper specimen collection/handling, submission of specimen other than nasopharyngeal swab, presence of viral mutation(s) within the areas targeted by this assay, and inadequate number of viral copies(<138 copies/mL). A negative result must be combined with clinical observations, patient history, and epidemiological information. The expected result is Negative.  Fact Sheet for Patients:  EntrepreneurPulse.com.au  Fact Sheet for Healthcare Providers:  IncredibleEmployment.be  This test is no t yet approved or cleared by the [place] FDA and  has been authorized for detection and/or diagnosis of SARS-CoV-2 by FDA under an Emergency Use Authorization (EUA). This EUA will remain  in effect (meaning this test can be used) for the duration of the COVID-19 declaration under Section 564(b)(1) of the Act, 21 U.S.C.section 360bbb-3(b)(1), unless the authorization is terminated  or revoked sooner.       Influenza A by PCR NEGATIVE NEGATIVE Final   Influenza B by PCR NEGATIVE NEGATIVE Final    Comment: (NOTE) The Xpert Xpress SARS-CoV-2/FLU/RSV plus assay is intended as an aid in the diagnosis of influenza from Nasopharyngeal swab specimens and should not be used as a sole basis  for treatment. Nasal washings and aspirates are unacceptable for Xpert Xpress SARS-CoV-2/FLU/RSV testing.  Fact Sheet for Patients: EntrepreneurPulse.com.au  Fact Sheet for Healthcare Providers: IncredibleEmployment.be  This test is not yet approved or cleared by the [place] FDA and has been authorized for detection and/or diagnosis of SARS-CoV-2 by FDA under an Emergency Use Authorization (EUA). This EUA will remain in effect (meaning this test can be used) for the duration of the COVID-19 declaration under Section 564(b)(1) of  the Act, 21 U.S.C. section 360bbb-3(b)(1), unless the authorization is terminated or revoked.  Performed at [facility], [address], [place], [place] [address]       Radiology Studies: No results found.   Pamella Pert, MD, PhD [facility]  Between 7 am - 7 pm I am available, please contact me via Amion (for emergencies) or Securechat (non urgent messages)  Between 7 pm - 7 am I am not available, please contact night coverage MD/APP via Amion

## 2020-12-04 DIAGNOSIS — A419 Sepsis, unspecified organism: Secondary | ICD-10-CM | POA: Diagnosis not present

## 2020-12-04 DIAGNOSIS — F411 Generalized anxiety disorder: Secondary | ICD-10-CM | POA: Diagnosis not present

## 2020-12-04 DIAGNOSIS — L0390 Cellulitis, unspecified: Secondary | ICD-10-CM | POA: Diagnosis not present

## 2020-12-04 DIAGNOSIS — L03312 Cellulitis of back [any part except buttock]: Secondary | ICD-10-CM | POA: Diagnosis not present

## 2020-12-04 LAB — BASIC METABOLIC PANEL
Anion gap: 10 (ref 5–15)
BUN: 28 mg/dL — ABNORMAL HIGH (ref 6–20)
CO2: 23 mmol/L (ref 22–32)
Calcium: 8.7 mg/dL — ABNORMAL LOW (ref 8.9–10.3)
Chloride: 103 mmol/L (ref 98–111)
Creatinine, Ser: 1.23 mg/dL (ref 0.61–1.24)
GFR, Estimated: >60 mL/min (ref >60)
Glucose, Bld: 188 mg/dL — ABNORMAL HIGH (ref 70–99)
Potassium: 4.1 mmol/L (ref 3.5–5.1)
Sodium: 136 mmol/L (ref 135–145)

## 2020-12-04 LAB — CBC
HCT: 29.1 % — ABNORMAL LOW (ref 39.0–52.0)
Hemoglobin: 9.3 g/dL — ABNORMAL LOW (ref 13.0–17.0)
MCH: 27.8 pg (ref 26.0–34.0)
MCHC: 32 g/dL (ref 30.0–36.0)
MCV: 86.9 fL (ref 80.0–100.0)
Platelets: 218 10*3/uL (ref 150–400)
RBC: 3.35 MIL/uL — ABNORMAL LOW (ref 4.22–5.81)
RDW: 14.9 % (ref 11.5–15.5)
WBC: 13.8 10*3/uL — ABNORMAL HIGH (ref 4.0–10.5)
nRBC: 0 % (ref 0.0–0.2)

## 2020-12-04 LAB — GLUCOSE, CAPILLARY
Glucose-Capillary: 66 mg/dL — ABNORMAL LOW (ref 70–99)
Glucose-Capillary: 71 mg/dL (ref 70–99)
Glucose-Capillary: 194 mg/dL — ABNORMAL HIGH (ref 70–99)
Glucose-Capillary: 164 mg/dL — ABNORMAL HIGH (ref 70–99)
Glucose-Capillary: 110 mg/dL — ABNORMAL HIGH (ref 70–99)

## 2020-12-04 MED ORDER — INSULIN GLARGINE-YFGN 100 UNIT/ML ~~LOC~~ SOLN
15.0000 [IU] | Freq: Every day | SUBCUTANEOUS | Status: DC
Start: 2020-12-04 — End: 2020-12-05
  Administered 2020-12-04: 15 [IU] via SUBCUTANEOUS
  Filled 2020-12-04: qty 0.15

## 2020-12-04 NOTE — Progress Notes (Signed)
PROGRESS NOTE  Mark [name] [id number] DOB: 10/20/1960 DOA: 11/30/2020 PCP: [facility]   LOS: 3 days   Brief Narrative / Interim history: Mark [name] is a 60 y.o. male with medical history significant for insulin-dependent type 2 diabetes, hypertension, hyperlipidemia, OSA on CPAP, s/p right AKA, depression/anxiety/bipolar disorder, and recurrent cellulitis/abscesses of the buttocks who presented to the ED for evaluation of lower back cellulitis.  Patient was recently admitted 9/20 3-09/2025 2022 for buttock and pilonidal abscess, underwent incisional debridement on 9/25.  Wound cultures grew MRSA, speciated after his discharge.  He was sent home on Augmentin.  Few days ago he noted a new linear lesion on his lower back with multiple pustular and painful lesions the same area, clustered.  He has noticed small bloody drainage but the main thing was excruciating pain that he has had with his lesions and that is what prompted him to come to the ED  Subjective / 24h Interval events: Pain is improved.  He is able to space out more his pain medications.  Assessment & Plan: Principal Problem Lower back cellulitis, sepsis-he presented and was admitted to the hospital with leukocytosis, tachycardia, and a source of infection. -He was placed on vancomycin, ID consulted, appreciate input.  Briefly on Bactrim but this was changed to doxycycline due to elevation in creatinine.  He is also on Valtrex. -Cellulitic appearance seems to have improved today but he continues to have a cluster of pustular/vesicular lesions which are very tender to palpation.  General surgery following for potential I&D -Awaiting cultures, viral PCR  Active Problems Essential hypertension-continue home medications with Coreg, lisinopril  Depression/anxiety-continue home regimen  OSA-continue CPAP  IDDM, with hyperglycemia, poorly controlled with A1c 9.9 - Continue glargine 15 in the morning and 20 5 in the evening  along with 15 units of mealtime coverage plus resistant sliding scale.  Recurrent hypoglycemia this morning, continue to decrease evening long-acting  CBG (last 3)  Recent Labs    12/03/20 2001 12/04/20 0754 12/04/20 0852  GLUCAP 220* 66* 71    Chronic kidney disease stage IIIa-Baseline creatinine 1.2-1.4, currently around 1.6.  Seems a bit dehydrated, will give fluids.  May need to readdress Bactrim if creatinine continues to go up tomorrow  Hypertension -Continue Coreg and lisinopril.   Hyperlipidemia -Continue atorvastatin.   Depression/anxiety/bipolar disorder -Continue Abilify, bupropion, buspirone, escitalopram.  Continue hydroxyzine as needed.   OSA: Continue CPAP nightly.  Scheduled Meds:  ARIPiprazole  10 mg Oral Daily   atorvastatin  80 mg Oral Daily   buPROPion  300 mg Oral Daily   busPIRone  15 mg Oral TID   carvedilol  25 mg Oral BID WC   clindamycin   Topical BID   doxycycline  100 mg Oral Q12H   enoxaparin (LOVENOX) injection  40 mg Subcutaneous Q24H   escitalopram  20 mg Oral Daily   insulin aspart  0-20 Units Subcutaneous TID WC   insulin aspart  0-5 Units Subcutaneous QHS   insulin aspart  5 Units Subcutaneous TID WC   insulin glargine-yfgn  20 Units Subcutaneous QHS   insulin glargine-yfgn  35 Units Subcutaneous q morning   lisinopril  40 mg Oral Daily   polyethylene glycol  17 g Oral Daily   senna-docusate  1 tablet Oral BID   valACYclovir  1,000 mg Oral TID   Continuous Infusions:  sodium chloride 10 mL/hr at 12/02/20 2223   PRN Meds:.sodium chloride, HYDROmorphone (DILAUDID) injection, hydrOXYzine, ondansetron **OR** ondansetron (ZOFRAN) IV, oxyCODONE-acetaminophen, traZODone  Diet Orders (From admission, onward)     Start     Ordered   12/03/20 1058  Diet Carb Modified Fluid consistency: Thin; Room service appropriate? Yes  Diet effective now       Question Answer Comment  Diet-HS Snack? Nothing   Calorie Level Medium 1600-2000   Fluid  consistency: Thin   Room service appropriate? Yes      12/03/20 1058            DVT prophylaxis: enoxaparin (LOVENOX) injection 40 mg Start: 11/30/20 1945     Code Status: Full Code  Family Communication: No family at bedside  Status is: Inpatient  Remains inpatient: Requiring IV pain medications  Level of care: Med-Surg  Consultants:  ID  Procedures:  none  Microbiology  Blood cultures 11/9-no growth to date  Antimicrobials: Vancomycin 11/9 Ceftriaxone 11/10   Objective: Vitals:   12/02/20 2109 12/03/20 0507 12/03/20 2108 12/04/20 0529  BP: (!) 103/56 139/68 116/62 (!) 157/67  Pulse: 86 88 87 88  Resp: 18 18 20 20   Temp: 98.8 F (37.1 C) 98.6 F (37 C) 98.5 F (36.9 C) 98.4 F (36.9 C)  TempSrc: Oral Oral Oral Oral  SpO2: 99% 99% 99% 100%  Weight:      Height:        Intake/Output Summary (Last 24 hours) at 12/04/2020 1029 Last data filed at 12/04/2020 1000 Gross per 24 hour  Intake 1748.52 ml  Output 1550 ml  Net 198.52 ml    Filed Weights   11/30/20 1525  Weight: 131.5 kg    Examination:  Constitutional: He is in no distress Eyes: Anicteric ENMT: Moist mucous membranes Neck: normal, supple Respiratory: Clear bilaterally, no wheezing Cardiovascular: Regular rhythm, no murmurs, no edema Abdomen: Nondistended Musculoskeletal: no clubbing / cyanosis.  Right AKA Skin: Slightly improved cellulitic rash as below Neurologic: No focal deficits    Data Reviewed: I have independently reviewed following labs and imaging studies   CBC: Recent Labs  Lab 11/30/20 1627 12/01/20 0315 12/02/20 0329 12/03/20 0413  WBC 17.5* 15.6* 17.8* 15.1*  NEUTROABS 14.4*  --   --   --   HGB 11.5* 10.6* 10.7* 10.7*  HCT 35.4* 33.1* 32.9* 32.6*  MCV 85.3 86.9 85.7 85.6  PLT 210 187 216 196    Basic Metabolic Panel: Recent Labs  Lab 11/30/20 1627 12/01/20 0315 12/02/20 0329 12/03/20 0413  NA 130* 132* 137 135  K 4.2 4.5 4.1 5.3*  CL 98 101  103 104  CO2 23 20* 24 20*  GLUCOSE 326* 370* 89 102*  BUN 20 21* 27* 38*  CREATININE 1.26* 1.23 1.47* 1.64*  CALCIUM 8.7* 8.6* 8.9 8.9    Liver Function Tests: Recent Labs  Lab 12/03/20 0413  AST 37  ALT 27  ALKPHOS 126  BILITOT 0.3  PROT 7.3  ALBUMIN 2.9*    Coagulation Profile: No results for input(s): INR, PROTIME in the last 168 hours. HbA1C: No results for input(s): HGBA1C in the last 72 hours. CBG: Recent Labs  Lab 12/03/20 1151 12/03/20 1611 12/03/20 2001 12/04/20 0754 12/04/20 0852  GLUCAP 99 246* 220* 66* 71     Recent Results (from the past 240 hour(s))  Culture, blood (routine x 2)     Status: None (Preliminary result)   Collection Time: 11/30/20  4:28 PM   Specimen: BLOOD LEFT FOREARM  Result Value Ref Range Status   Specimen Description   Final    BLOOD LEFT FOREARM Performed at [facility]  [place] [address] [place], [place] [address]    Special Requests   Final    BOTTLES DRAWN AEROBIC AND ANAEROBIC Blood Culture results may not be optimal due to an inadequate volume of blood received in culture bottles Performed at [facility] [address], [place], [place] [address]    Culture   Final    NO GROWTH 4 DAYS Performed at [facility], [place] [address] [place], [place] [address]    Report Status PENDING  Incomplete  Culture, blood (routine x 2)     Status: None (Preliminary result)   Collection Time: 11/30/20  4:33 PM   Specimen: BLOOD  Result Value Ref Range Status   Specimen Description   Final    BLOOD LEFT ANTECUBITAL Performed at [facility] [address] [place], [place] [address]    Special Requests   Final    BOTTLES DRAWN AEROBIC AND ANAEROBIC Blood Culture results may not be optimal due to an inadequate volume of blood received in culture bottles Performed at [facility] [address], [place], [place] [address]    Culture   Final    NO  GROWTH 4 DAYS Performed at [facility], [place] [address], [place], [place] [address]    Report Status PENDING  Incomplete  Resp Panel by RT-PCR (Flu A&B, Covid) Nasopharyngeal Swab     Status: None   Collection Time: 11/30/20  6:23 PM   Specimen: Nasopharyngeal Swab; Nasopharyngeal(NP) swabs in vial transport medium  Result Value Ref Range Status   SARS Coronavirus 2 by RT PCR NEGATIVE NEGATIVE Final    Comment: (NOTE) SARS-CoV-2 target nucleic acids are NOT DETECTED.  The SARS-CoV-2 RNA is generally detectable in upper respiratory specimens during the acute phase of infection. The lowest concentration of SARS-CoV-2 viral copies this assay can detect is 138 copies/mL. A negative result does not preclude SARS-Cov-2 infection and should not be used as the sole basis for treatment or other patient management decisions. A negative result may occur with  improper specimen collection/handling, submission of specimen other than nasopharyngeal swab, presence of viral mutation(s) within the areas targeted by this assay, and inadequate number of viral copies(<138 copies/mL). A negative result must be combined with clinical observations, patient history, and epidemiological information. The expected result is Negative.  Fact Sheet for Patients:  EntrepreneurPulse.com.au  Fact Sheet for Healthcare Providers:  IncredibleEmployment.be  This test is no t yet approved or cleared by the [place] FDA and  has been authorized for detection and/or diagnosis of SARS-CoV-2 by FDA under an Emergency Use Authorization (EUA). This EUA will remain  in effect (meaning this test can be used) for the duration of the COVID-19 declaration under Section 564(b)(1) of the Act, 21 U.S.C.section 360bbb-3(b)(1), unless the authorization is terminated  or revoked sooner.       Influenza A by PCR NEGATIVE NEGATIVE Final   Influenza B by PCR NEGATIVE NEGATIVE Final     Comment: (NOTE) The Xpert Xpress SARS-CoV-2/FLU/RSV plus assay is intended as an aid in the diagnosis of influenza from Nasopharyngeal swab specimens and should not be used as a sole basis for treatment. Nasal washings and aspirates are unacceptable for Xpert Xpress SARS-CoV-2/FLU/RSV testing.  Fact Sheet for Patients: EntrepreneurPulse.com.au  Fact Sheet for Healthcare Providers: IncredibleEmployment.be  This test is not yet approved or cleared by the [place] FDA and has been authorized for detection and/or diagnosis of SARS-CoV-2  by FDA under an Emergency Use Authorization (EUA). This EUA will remain in effect (meaning this test can be used) for the duration of the COVID-19 declaration under Section 564(b)(1) of the Act, 21 U.S.C. section 360bbb-3(b)(1), unless the authorization is terminated or revoked.  Performed at [facility], [place] [address] [place], [place] [address]       Radiology Studies: No results found.   Marzetta Board, MD, PhD [facility]  Between 7 am - 7 pm I am available, please contact me via Amion (for emergencies) or Securechat (non urgent messages)  Between 7 pm - 7 am I am not available, please contact night coverage MD/APP via Amion

## 2020-12-04 NOTE — Plan of Care (Signed)
  Problem: Activity: Goal: Risk for activity intolerance will decrease Outcome: Progressing   Problem: Nutrition: Goal: Adequate nutrition will be maintained Outcome: Progressing   Problem: Pain Managment: Goal: General experience of comfort will improve Outcome: Progressing   Problem: Safety: Goal: Ability to remain free from injury will improve Outcome: Progressing   

## 2020-12-05 ENCOUNTER — Ambulatory Visit (INDEPENDENT_AMBULATORY_CARE_PROVIDER_SITE_OTHER): Payer: No Typology Code available for payment source | Admitting: Licensed Clinical Social Worker

## 2020-12-05 ENCOUNTER — Encounter (HOSPITAL_COMMUNITY): Payer: Self-pay | Admitting: Licensed Clinical Social Worker

## 2020-12-05 DIAGNOSIS — L03312 Cellulitis of back [any part except buttock]: Secondary | ICD-10-CM | POA: Diagnosis not present

## 2020-12-05 DIAGNOSIS — F331 Major depressive disorder, recurrent, moderate: Secondary | ICD-10-CM | POA: Diagnosis not present

## 2020-12-05 DIAGNOSIS — F419 Anxiety disorder, unspecified: Secondary | ICD-10-CM | POA: Diagnosis not present

## 2020-12-05 DIAGNOSIS — F4310 Post-traumatic stress disorder, unspecified: Secondary | ICD-10-CM

## 2020-12-05 DIAGNOSIS — A419 Sepsis, unspecified organism: Secondary | ICD-10-CM | POA: Diagnosis not present

## 2020-12-05 DIAGNOSIS — L0390 Cellulitis, unspecified: Secondary | ICD-10-CM | POA: Diagnosis not present

## 2020-12-05 DIAGNOSIS — F411 Generalized anxiety disorder: Secondary | ICD-10-CM | POA: Diagnosis not present

## 2020-12-05 LAB — CULTURE, BLOOD (ROUTINE X 2)
Culture: NO GROWTH
Culture: NO GROWTH

## 2020-12-05 LAB — BASIC METABOLIC PANEL
Anion gap: 8 (ref 5–15)
BUN: 25 mg/dL — ABNORMAL HIGH (ref 6–20)
CO2: 24 mmol/L (ref 22–32)
Calcium: 8.7 mg/dL — ABNORMAL LOW (ref 8.9–10.3)
Chloride: 104 mmol/L (ref 98–111)
Creatinine, Ser: 1.2 mg/dL (ref 0.61–1.24)
GFR, Estimated: >60 mL/min (ref >60)
Glucose, Bld: 143 mg/dL — ABNORMAL HIGH (ref 70–99)
Potassium: 4 mmol/L (ref 3.5–5.1)
Sodium: 136 mmol/L (ref 135–145)

## 2020-12-05 LAB — GLUCOSE, CAPILLARY
Glucose-Capillary: 67 mg/dL — ABNORMAL LOW (ref 70–99)
Glucose-Capillary: 117 mg/dL — ABNORMAL HIGH (ref 70–99)
Glucose-Capillary: 172 mg/dL — ABNORMAL HIGH (ref 70–99)
Glucose-Capillary: 149 mg/dL — ABNORMAL HIGH (ref 70–99)
Glucose-Capillary: 253 mg/dL — ABNORMAL HIGH (ref 70–99)

## 2020-12-05 MED ORDER — INSULIN GLARGINE-YFGN 100 UNIT/ML ~~LOC~~ SOLN
10.0000 [IU] | Freq: Every day | SUBCUTANEOUS | Status: DC
  Administered 2020-12-05 – 2020-12-07 (×3): 10 [IU] via SUBCUTANEOUS
  Filled 2020-12-05 (×4): qty 0.1

## 2020-12-05 NOTE — Progress Notes (Signed)
PROGRESS NOTE  Mark [name] [id number] DOB: February 02, 1960 DOA: 11/30/2020 PCP: [facility]   LOS: 4 days   Brief Narrative / Interim history: Mark [name] is a 60 y.o. male with medical history significant for insulin-dependent type 2 diabetes, hypertension, hyperlipidemia, OSA on CPAP, s/p right AKA, depression/anxiety/bipolar disorder, and recurrent cellulitis/abscesses of the buttocks who presented to the ED for evaluation of lower back cellulitis.  Patient was recently admitted 9/20 3-09/2025 2022 for buttock and pilonidal abscess, underwent incisional debridement on 9/25.  Wound cultures grew MRSA, speciated after his discharge.  He was sent home on Augmentin.  Few days ago he noted a new linear lesion on his lower back with multiple pustular and painful lesions the same area, clustered.  He has noticed small bloody drainage but the main thing was excruciating pain that he has had with his lesions and that is what prompted him to come to the ED  Subjective / 24h Interval events: Feels about the same, still complains of pain at the site, controlled with IV pain medications though   Assessment & Plan: Principal Problem Lower back cellulitis, sepsis-he presented and was admitted to the hospital with leukocytosis, tachycardia, and a source of infection. -He was placed on vancomycin, ID consulted, appreciate input.  Briefly on Bactrim but this was changed to doxycycline due to elevation in creatinine.  He is also on Valtrex. -Cellulitic appearance seems to have improved today but he continues to have a cluster of pustular/vesicular lesions which are very tender to palpation.  General surgery following, they are still considering potential I&D -Awaiting cultures, preliminary shows gram-positive cocci, viral PCR  Active Problems Essential hypertension-continue home medications with Coreg, lisinopril  Depression/anxiety-continue home regimen  OSA-continue CPAP  IDDM, with  hyperglycemia, poorly controlled with A1c 9.9 -continues to have hypoglycemic episodes, decrease insulin furthermore.  Discussed with patient at bedside today regarding dietary changes is clearly he is eating here much healthier than he is otherwise at home.  CBG (last 3)  Recent Labs    12/04/20 2121 12/05/20 0840 12/05/20 0934  GLUCAP 110* 67* 117*    Chronic kidney disease stage IIIa-Baseline creatinine 1.2-1.4, currently around 1.6.  Seems a bit dehydrated, will give fluids.  May need to readdress Bactrim if creatinine continues to go up tomorrow  Hypertension -Continue Coreg and lisinopril.   Hyperlipidemia -Continue atorvastatin.   Depression/anxiety/bipolar disorder -Continue Abilify, bupropion, buspirone, escitalopram.  Continue hydroxyzine as needed.   OSA: Continue CPAP nightly.  Scheduled Meds:  ARIPiprazole  10 mg Oral Daily   atorvastatin  80 mg Oral Daily   buPROPion  300 mg Oral Daily   busPIRone  15 mg Oral TID   carvedilol  25 mg Oral BID WC   clindamycin   Topical BID   doxycycline  100 mg Oral Q12H   enoxaparin (LOVENOX) injection  40 mg Subcutaneous Q24H   escitalopram  20 mg Oral Daily   insulin aspart  0-20 Units Subcutaneous TID WC   insulin aspart  0-5 Units Subcutaneous QHS   insulin aspart  5 Units Subcutaneous TID WC   insulin glargine-yfgn  15 Units Subcutaneous QHS   insulin glargine-yfgn  35 Units Subcutaneous q morning   lisinopril  40 mg Oral Daily   polyethylene glycol  17 g Oral Daily   senna-docusate  1 tablet Oral BID   valACYclovir  1,000 mg Oral TID   Continuous Infusions:  sodium chloride 10 mL/hr at 12/02/20 2223   PRN Meds:.sodium chloride, HYDROmorphone (DILAUDID)  injection, hydrOXYzine, ondansetron **OR** ondansetron (ZOFRAN) IV, oxyCODONE-acetaminophen, traZODone  Diet Orders (From admission, onward)     Start     Ordered   12/03/20 1058  Diet Carb Modified Fluid consistency: Thin; Room service appropriate? Yes  Diet  effective now       Question Answer Comment  Diet-HS Snack? Nothing   Calorie Level Medium 1600-2000   Fluid consistency: Thin   Room service appropriate? Yes      12/03/20 1058            DVT prophylaxis: enoxaparin (LOVENOX) injection 40 mg Start: 11/30/20 1945     Code Status: Full Code  Family Communication: No family at bedside  Status is: Inpatient  Remains inpatient: Requiring IV pain medications  Level of care: Med-Surg  Consultants:  ID  Procedures:  none  Microbiology  Blood cultures 11/9-no growth to date  Antimicrobials: Vancomycin 11/9 Ceftriaxone 11/10   Objective: Vitals:   12/04/20 2122 12/04/20 2358 12/05/20 0510 12/05/20 0850  BP: (!) 122/92  (!) 150/73 (!) 157/67  Pulse: 83 90 73 88  Resp: 18 19 16 18   Temp: 97.8 F (36.6 C)  98.3 F (36.8 C) 97.9 F (36.6 C)  TempSrc: Oral  Oral Oral  SpO2: 100% 100% 98% 100%  Weight:      Height:        Intake/Output Summary (Last 24 hours) at 12/05/2020 1033 Last data filed at 12/05/2020 0600 Gross per 24 hour  Intake 720 ml  Output 1700 ml  Net -980 ml    Filed Weights   11/30/20 1525  Weight: 131.5 kg    Examination:  Constitutional: NAD Eyes: No scleral icterus  ENMT: Moist mucous membranes Neck: normal, supple Respiratory: Clear to auscultation bilaterally, no wheezing Cardiovascular: Regular rate and rhythm, no murmurs, no edema Abdomen: Nondistended Musculoskeletal: Right AKA Skin: Cellulitic rash unchanged Neurologic: Nonfocal    Data Reviewed: I have independently reviewed following labs and imaging studies   CBC: Recent Labs  Lab 11/30/20 1627 12/01/20 0315 12/02/20 0329 12/03/20 0413 12/04/20 1018  WBC 17.5* 15.6* 17.8* 15.1* 13.8*  NEUTROABS 14.4*  --   --   --   --   HGB 11.5* 10.6* 10.7* 10.7* 9.3*  HCT 35.4* 33.1* 32.9* 32.6* 29.1*  MCV 85.3 86.9 85.7 85.6 86.9  PLT 210 187 216 196 [id number]    Basic Metabolic Panel: Recent Labs  Lab 12/01/20 0315  12/02/20 0329 12/03/20 0413 12/04/20 1021 12/05/20 0323  NA 132* 137 135 136 136  K 4.5 4.1 5.3* 4.1 4.0  CL 101 103 104 103 104  CO2 20* 24 20* 23 24  GLUCOSE 370* 89 102* 188* 143*  BUN 21* 27* 38* 28* 25*  CREATININE 1.23 1.47* 1.64* 1.23 1.20  CALCIUM 8.6* 8.9 8.9 8.7* 8.7*    Liver Function Tests: Recent Labs  Lab 12/03/20 0413  AST 37  ALT 27  ALKPHOS 126  BILITOT 0.3  PROT 7.3  ALBUMIN 2.9*    Coagulation Profile: No results for input(s): INR, PROTIME in the last 168 hours. HbA1C: No results for input(s): HGBA1C in the last 72 hours. CBG: Recent Labs  Lab 12/04/20 1134 12/04/20 1656 12/04/20 2121 12/05/20 0840 12/05/20 0934  GLUCAP 194* 164* 110* 67* 117*     Recent Results (from the past 240 hour(s))  Culture, blood (routine x 2)     Status: None   Collection Time: 11/30/20  4:28 PM   Specimen: BLOOD LEFT FOREARM  Result Value  Ref Range Status   Specimen Description   Final    BLOOD LEFT FOREARM Performed at [facility] [address] [place], [place] [address]    Special Requests   Final    BOTTLES DRAWN AEROBIC AND ANAEROBIC Blood Culture results may not be optimal due to an inadequate volume of blood received in culture bottles Performed at [facility] [address], [place], [place] [address]    Culture   Final    NO GROWTH 5 DAYS Performed at [facility], [place] [address], [place], [place] [address]    Report Status 12/05/2020 FINAL  Final  Culture, blood (routine x 2)     Status: None   Collection Time: 11/30/20  4:33 PM   Specimen: BLOOD  Result Value Ref Range Status   Specimen Description   Final    BLOOD LEFT ANTECUBITAL Performed at [facility] [address] [place], [place] [address]    Special Requests   Final    BOTTLES DRAWN AEROBIC AND ANAEROBIC Blood Culture results may not be optimal due to an inadequate volume of blood received in culture  bottles Performed at [facility] [address] [place], [place] [address]    Culture   Final    NO GROWTH 5 DAYS Performed at [facility], [place] [address], [place], [place] [address]    Report Status 12/05/2020 FINAL  Final  Resp Panel by RT-PCR (Flu A&B, Covid) Nasopharyngeal Swab     Status: None   Collection Time: 11/30/20  6:23 PM   Specimen: Nasopharyngeal Swab; Nasopharyngeal(NP) swabs in vial transport medium  Result Value Ref Range Status   SARS Coronavirus 2 by RT PCR NEGATIVE NEGATIVE Final    Comment: (NOTE) SARS-CoV-2 target nucleic acids are NOT DETECTED.  The SARS-CoV-2 RNA is generally detectable in upper respiratory specimens during the acute phase of infection. The lowest concentration of SARS-CoV-2 viral copies this assay can detect is 138 copies/mL. A negative result does not preclude SARS-Cov-2 infection and should not be used as the sole basis for treatment or other patient management decisions. A negative result may occur with  improper specimen collection/handling, submission of specimen other than nasopharyngeal swab, presence of viral mutation(s) within the areas targeted by this assay, and inadequate number of viral copies(<138 copies/mL). A negative result must be combined with clinical observations, patient history, and epidemiological information. The expected result is Negative.  Fact Sheet for Patients:  EntrepreneurPulse.com.au  Fact Sheet for Healthcare Providers:  IncredibleEmployment.be  This test is no t yet approved or cleared by the [place] FDA and  has been authorized for detection and/or diagnosis of SARS-CoV-2 by FDA under an Emergency Use Authorization (EUA). This EUA will remain  in effect (meaning this test can be used) for the duration of the COVID-19 declaration under Section 564(b)(1) of the Act, 21 U.S.C.section 360bbb-3(b)(1), unless the authorization is  terminated  or revoked sooner.       Influenza A by PCR NEGATIVE NEGATIVE Final   Influenza B by PCR NEGATIVE NEGATIVE Final    Comment: (NOTE) The Xpert Xpress SARS-CoV-2/FLU/RSV plus assay is intended as an aid in the diagnosis of influenza from Nasopharyngeal swab specimens and should not be used as a sole basis for treatment. Nasal washings and aspirates are unacceptable for Xpert Xpress SARS-CoV-2/FLU/RSV testing.  Fact Sheet for Patients: EntrepreneurPulse.com.au  Fact Sheet for Healthcare Providers: IncredibleEmployment.be  This test is not  yet approved or cleared by the [place] and has been authorized for detection and/or diagnosis of SARS-CoV-2 by FDA under an Emergency Use Authorization (EUA). This EUA will remain in effect (meaning this test can be used) for the duration of the COVID-19 declaration under Section 564(b)(1) of the Act, 21 U.S.C. section 360bbb-3(b)(1), unless the authorization is terminated or revoked.  Performed at [facility], [address] [place], [place] [address]   Aerobic/Anaerobic Culture w Gram Stain (surgical/deep wound)     Status: None (Preliminary result)   Collection Time: 12/03/20  2:34 PM   Specimen: Back  Result Value Ref Range Status   Specimen Description   Final    BACK Performed at [facility], [address], [place], [place] [address]    Special Requests   Final    Normal Performed at [facility], [address] [place], [place] [address]    Gram Stain   Final    RARE SQUAMOUS EPITHELIAL CELLS PRESENT RARE WBC PRESENT,BOTH PMN AND MONONUCLEAR RARE GRAM POSITIVE COCCI    Culture   Final    TOO YOUNG TO READ Performed at [facility], [address], [place], [place] [address]    Report Status PENDING  Incomplete      Radiology Studies: No results found.   Pamella Pert, MD, PhD [facility]  Between 7 am - 7 pm I am available, please contact me via Amion (for emergencies) or Securechat (non urgent messages)  Between 7 pm - 7 am I am not available, please contact night coverage MD/APP via Amion

## 2020-12-05 NOTE — Plan of Care (Signed)
  Problem: Clinical Measurements: Goal: Ability to maintain clinical measurements within normal limits will improve Outcome: Progressing   Problem: Activity: Goal: Risk for activity intolerance will decrease Outcome: Progressing   Problem: Elimination: Goal: Will not experience complications related to bowel motility Outcome: Progressing   Problem: Pain Managment: Goal: General experience of comfort will improve Outcome: Progressing   Problem: Safety: Goal: Ability to remain free from injury will improve Outcome: Progressing   Problem: Skin Integrity: Goal: Risk for impaired skin integrity will decrease Outcome: Progressing   

## 2020-12-05 NOTE — Progress Notes (Signed)
Virtual Visit via Video Note  I connected with Mark [name] on 12/05/20 at 5:00 pm EST by a video enabled telemedicine application and verified that I am speaking with the correct person using two identifiers.   I discussed the limitations of evaluation and management by telemedicine and the availability of in person appointments. The patient expressed understanding and agreed to proceed.  LOCATION: Patient: Home Provider: home Office  History of Present Illness: [facility], depression, anxiety.   Observation/Objective:   Patient participated in a discussion on overcoming life'[name] challenges, under stress, which makes it more difficult to overcome obstacles in life. Patient was encouraged to identify life challenges and explore strategies for overcoming obstacles.                            Assessment and Plan: Counselor will continue to meet with patient address treatment plan goals. Patient recommendations of providers and implement skills learned in session.     Follow-up instructions: I discussed the assessment and treatment plan with the patient. The patient was provided an opportunity to ask questions and all were answered. The patient agreed with the plan and demonstrated an understanding of the instructions.   The patient was advised to call back or seek an in-person evaluation if the symptoms worsen or if the condition fails to improve as anticipated.  I provided 60 minutes of non-face-to-face time during this encounter.   Mark [name], LCAS

## 2020-12-05 NOTE — Progress Notes (Addendum)
Subjective/Chief Complaint: Complains of lower back pain controlled with pain meds, including dilaudid.    Objective: Vital signs in last 24 hours: Temp:  [97.8 F (36.6 C)-98.3 F (36.8 C)] 97.9 F (36.6 C) (11/14 0850) Pulse Rate:  [73-90] 88 (11/14 0850) Resp:  [16-19] 18 (11/14 0850) BP: (111-157)/(58-92) 157/67 (11/14 0850) SpO2:  [98 %-100 %] 100 % (11/14 0850) Last BM Date: 12/04/20  Intake/Output from previous day: 11/13 0701 - 11/14 0700 In: 960 [P.O.:960] Out: 1700 [Urine:1700] Intake/Output this shift: No intake/output data recorded.  General appearance: alert and cooperative Back: lower, central back with carbuncle that is crusting. No active drainage. No obvious fluctuance. Surrounding erythema appears stable Resp: clear to auscultation bilaterally Cardio: regular rate and rhythm GI: soft, non-tender; bowel sounds normal; no masses,  no organomegaly  Lab Results:  Recent Labs    12/03/20 0413 12/04/20 1018  WBC 15.1* 13.8*  HGB 10.7* 9.3*  HCT 32.6* 29.1*  PLT 196 218   BMET Recent Labs    12/04/20 1021 12/05/20 0323  NA 136 136  K 4.1 4.0  CL 103 104  CO2 23 24  GLUCOSE 188* 143*  BUN 28* 25*  CREATININE 1.23 1.20  CALCIUM 8.7* 8.7*   PT/INR No results for input(s): LABPROT, INR in the last 72 hours. ABG No results for input(s): PHART, HCO3 in the last 72 hours.  Invalid input(s): PCO2, PO2  Studies/Results: No results found.  Anti-infectives: Anti-infectives (From admission, onward)    Start     Dose/Rate Route Frequency Ordered Stop   12/03/20 2000  doxycycline (VIBRA-TABS) tablet 100 mg        100 mg Oral Every 12 hours 12/03/20 1421     12/03/20 1000  sulfamethoxazole-trimethoprim (BACTRIM DS) 800-160 MG per tablet 1 tablet  Status:  Discontinued        1 tablet Oral Every 12 hours 12/03/20 0706 12/03/20 1421   12/02/20 2200  sulfamethoxazole-trimethoprim (BACTRIM DS) 800-160 MG per tablet 2 tablet  Status:  Discontinued         2 tablet Oral Every 12 hours 12/02/20 1530 12/03/20 0707   12/01/20 1600  valACYclovir (VALTREX) tablet 1,000 mg        1,000 mg Oral 3 times daily 12/01/20 1453 12/10/20 2359   12/01/20 1230  cefTRIAXone (ROCEPHIN) 2 g in sodium chloride 0.9 % 100 mL IVPB  Status:  Discontinued        2 g 200 mL/hr over 30 Minutes Intravenous Every 24 hours 12/01/20 1134 12/02/20 1530   12/01/20 0600  vancomycin (VANCOCIN) IVPB 1000 mg/200 mL premix  Status:  Discontinued        1,000 mg 200 mL/hr over 60 Minutes Intravenous Every 12 hours 11/30/20 2037 12/02/20 1530   11/30/20 1815  vancomycin (VANCOREADY) IVPB 2000 mg/400 mL        2,000 mg 200 mL/hr over 120 Minutes Intravenous  Once 11/30/20 1804 11/30/20 2103       Assessment/Plan: Lower back cellulitis - There is no obvious undrained fluid collection on exam or CT. Area remains tender and indurated. Overall it seems to be slowly improving with non-operative management, though his pain, induration, and history of requiring multiple I&D's for this to resolve in the past do make me question if it will fully resolve with abx alone. No urgent indication for surgery today, will discuss further with MD.   - Abx per ID;  Doxycycline/Valtrex.   Hx of left inferior back abscess drainage by  Dr. Carolynne Edouard on 9/25  - Healing well. Continue WTD dressing changes.    FEN - HH VTE - SCDs ID - Rocephin/Vanc 11/9-11/13, clindamycin topical 11/1 >>,  Valtrex 11/10>>, doxycycline 11/12 >>   IDDM2 HTN HLD R AKA  LOS: 4 days    Adam Phenix 12/05/2020

## 2020-12-06 DIAGNOSIS — L03312 Cellulitis of back [any part except buttock]: Secondary | ICD-10-CM | POA: Diagnosis not present

## 2020-12-06 DIAGNOSIS — A419 Sepsis, unspecified organism: Secondary | ICD-10-CM | POA: Diagnosis not present

## 2020-12-06 DIAGNOSIS — F411 Generalized anxiety disorder: Secondary | ICD-10-CM | POA: Diagnosis not present

## 2020-12-06 DIAGNOSIS — L0390 Cellulitis, unspecified: Secondary | ICD-10-CM | POA: Diagnosis not present

## 2020-12-06 LAB — HSV DNA BY PCR (REFERENCE LAB)
HSV 1 DNA: NEGATIVE
HSV 2 DNA: NEGATIVE

## 2020-12-06 LAB — GLUCOSE, CAPILLARY
Glucose-Capillary: 158 mg/dL — ABNORMAL HIGH (ref 70–99)
Glucose-Capillary: 212 mg/dL — ABNORMAL HIGH (ref 70–99)
Glucose-Capillary: 265 mg/dL — ABNORMAL HIGH (ref 70–99)
Glucose-Capillary: 177 mg/dL — ABNORMAL HIGH (ref 70–99)

## 2020-12-06 NOTE — Plan of Care (Signed)

## 2020-12-06 NOTE — Progress Notes (Signed)
Patient ID: Mark [name], male   DOB: [birth year], 60 y.o.   MRN: [id number]  [facility] Surgery Progress Note     Subjective: CC-  Feeling a little better today.   Objective: Vital signs in last 24 hours: Temp:  [98 F (36.7 C)-98.8 F (37.1 C)] 98 F (36.7 C) (11/15 0514) Pulse Rate:  [80-84] 84 (11/15 0514) Resp:  [17-18] 17 (11/15 0514) BP: (141-153)/(65-71) 153/71 (11/15 0514) SpO2:  [97 %-100 %] 97 % (11/15 0514) Last BM Date: 12/05/20  Intake/Output from previous day: 11/14 0701 - 11/15 0700 In: 1200 [P.O.:1200] Out: 825 [Urine:825] Intake/Output this shift: No intake/output data recorded.  PE: General appearance: alert, NAD Resp: rate and effort normal GI: soft, nontender Skin: warm and dry Back: lower, central back with carbuncle that is crusting. No obvious fluctuance or expressed purulence. Surrounding erythema appears improved and more purple than bright red. Induration stable/ slightly softer    Lab Results:  Recent Labs    12/04/20 1018  WBC 13.8*  HGB 9.3*  HCT 29.1*  PLT 218   BMET Recent Labs    12/04/20 1021 12/05/20 0323  NA 136 136  K 4.1 4.0  CL 103 104  CO2 23 24  GLUCOSE 188* 143*  BUN 28* 25*  CREATININE 1.23 1.20  CALCIUM 8.7* 8.7*   PT/INR No results for input(s): LABPROT, INR in the last 72 hours. CMP     Component Value Date/Time   NA 136 12/05/2020 0323   K 4.0 12/05/2020 0323   CL 104 12/05/2020 0323   CO2 24 12/05/2020 0323   GLUCOSE 143 (H) 12/05/2020 0323   BUN 25 (H) 12/05/2020 0323   CREATININE 1.20 12/05/2020 0323   CALCIUM 8.7 (L) 12/05/2020 0323   PROT 7.3 12/03/2020 0413   ALBUMIN 2.9 (L) 12/03/2020 0413   AST 37 12/03/2020 0413   ALT 27 12/03/2020 0413   ALKPHOS 126 12/03/2020 0413   BILITOT 0.3 12/03/2020 0413   GFRNONAA >60 12/05/2020 0323   GFRAA >60 12/26/2017 2204   Lipase     Component Value Date/Time   LIPASE 159 (H) 12/26/2017 2204       Studies/Results: No results  found.  Anti-infectives: Anti-infectives (From admission, onward)    Start     Dose/Rate Route Frequency Ordered Stop   12/03/20 2000  doxycycline (VIBRA-TABS) tablet 100 mg        100 mg Oral Every 12 hours 12/03/20 1421     12/03/20 1000  sulfamethoxazole-trimethoprim (BACTRIM DS) 800-160 MG per tablet 1 tablet  Status:  Discontinued        1 tablet Oral Every 12 hours 12/03/20 0706 12/03/20 1421   12/02/20 2200  sulfamethoxazole-trimethoprim (BACTRIM DS) 800-160 MG per tablet 2 tablet  Status:  Discontinued        2 tablet Oral Every 12 hours 12/02/20 1530 12/03/20 0707   12/01/20 1600  valACYclovir (VALTREX) tablet 1,000 mg        1,000 mg Oral 3 times daily 12/01/20 1453 12/10/20 2359   12/01/20 1230  cefTRIAXone (ROCEPHIN) 2 g in sodium chloride 0.9 % 100 mL IVPB  Status:  Discontinued        2 g 200 mL/hr over 30 Minutes Intravenous Every 24 hours 12/01/20 1134 12/02/20 1530   12/01/20 0600  vancomycin (VANCOCIN) IVPB 1000 mg/200 mL premix  Status:  Discontinued        1,000 mg 200 mL/hr over 60 Minutes Intravenous Every 12 hours 11/30/20 2037 12/02/20  1530   11/30/20 1815  vancomycin (VANCOREADY) IVPB 2000 mg/400 mL        2,000 mg 200 mL/hr over 120 Minutes Intravenous  Once 11/30/20 1804 11/30/20 2103        Assessment/Plan Lower back cellulitis - There is no obvious undrained fluid collection on exam or CT.  - Overall slowly improving - less cellulitis and induration. Continue nonoperative management with antibiotics.    - Abx per ID;  Doxycycline/Valtrex.   Hx of left inferior back abscess drainage by Dr. Carolynne Edouard on 9/25  - Healing well. Continue WTD dressing changes.    FEN - HH VTE - SCDs, lovenox ID - Rocephin/Vanc 11/9-11/13, clindamycin topical 11/1 >>,  Valtrex 11/10>>, doxycycline 11/12 >>   IDDM2 HTN HLD R AKA   LOS: 5 days    Franne Forts, [facility] Surgery 12/06/2020, 9:19 AM Please see Amion for pager number during day hours  7:00am-4:30pm

## 2020-12-06 NOTE — Progress Notes (Signed)
Hypoglycemic Event  CBG: 67  Treatment: 8 oz juice/soda  Symptoms: None  Follow-up CBG: Time:09:34 CBG Result:117  Possible Reasons for Event: Inadequate meal intake  Comments/MD notified:Dr. Gherghe rounding on Pt   Kaedan Richert M Manley Fason

## 2020-12-06 NOTE — Progress Notes (Signed)
PT Cancellation/Screen Note  Patient Details Name: Mark [name] MRN: [id number] DOB: [birth year]   Cancelled Treatment:    Reason Eval/Treat Not Completed: PT screened, no needs identified, will sign off (Pt reports he has been mobilizing in room to transfer bed<>wc independently and has even taken a shower in his bathroom since admission. RN reports he is transfering independently and needs minimal help with pericare during toileting.)Pt is at baseline and PT will sign off at this time. Educated to requriest therapy if pt feels he declines.   Wynn Maudlin, DPT Acute Rehabilitation Services Office [phone] Pager [phone]

## 2020-12-06 NOTE — Progress Notes (Signed)
Pt states that he does not want to use CPAP qhs tonight.  Machine remains in room in case the Pt changes his mind.

## 2020-12-06 NOTE — Progress Notes (Signed)
PROGRESS NOTE  [name] [id number] DOB: [birth year] DOA: 11/30/2020 PCP: [facility]   LOS: 5 days   Brief Narrative / Interim history: Mark [name] is a 60 y.o. male with medical history significant for insulin-dependent type 2 diabetes, hypertension, hyperlipidemia, OSA on CPAP, s/p right AKA, depression/anxiety/bipolar disorder, and recurrent cellulitis/abscesses of the buttocks who presented to the ED for evaluation of lower back cellulitis.  Patient was recently admitted 9/20 3-09/2025 2022 for buttock and pilonidal abscess, underwent incisional debridement on 9/25.  Wound cultures grew MRSA, speciated after his discharge.  He was sent home on Augmentin.  Few days ago he noted a new linear lesion on his lower back with multiple pustular and painful lesions the same area, clustered.  He has noticed small bloody drainage but the main thing was excruciating pain that he has had with his lesions and that is what prompted him to come to the ED.  ID and general surgery consulted   Subjective / 24h Interval events: He tells me that overall his pain is a little bit better but still hurts and still appreciates a lot of drainage  Assessment & Plan: Principal Problem Lower back cellulitis, sepsis-he presented and was admitted to the hospital with leukocytosis, tachycardia, and a source of infection. -He was placed on vancomycin, ID consulted, appreciate input.  Briefly on Bactrim but this was changed to doxycycline due to elevation in creatinine.  He is also on Valtrex as a concurrent viral infection due to appearance could not be excluded. -Cellulitic appearance seems to be stable but he continues to have a cluster of pustular/vesicular lesions which are very tender to palpation.  General surgery following, they are still considering potential I&D -Awaiting cultures, preliminary shows staph aureus, HSV PCR negative, VZV PCR pending  Active Problems Essential hypertension-continue home  medications with Coreg, lisinopril  Depression/anxiety-continue home regimen  OSA-continue CPAP  IDDM, with hyperglycemia, poorly controlled with A1c 9.9 -he had several days in row with morning hypoglycemia, his insulin regimen has been adjusted but today finally he is no longer hypoglycemic.  Continue current regimen  CBG (last 3)  Recent Labs    12/05/20 1606 12/05/20 2203 12/06/20 0722  GLUCAP 149* 253* 158*    Chronic kidney disease stage IIIa-Baseline creatinine 1.2-1.4, currently at baseline  Hypertension -Continue Coreg and lisinopril.   Hyperlipidemia -Continue atorvastatin.   Depression/anxiety/bipolar disorder -Continue Abilify, bupropion, buspirone, escitalopram.  Continue hydroxyzine as needed.   OSA: Continue CPAP nightly.  Scheduled Meds:  ARIPiprazole  10 mg Oral Daily   atorvastatin  80 mg Oral Daily   buPROPion  300 mg Oral Daily   busPIRone  15 mg Oral TID   carvedilol  25 mg Oral BID WC   clindamycin   Topical BID   doxycycline  100 mg Oral Q12H   enoxaparin (LOVENOX) injection  40 mg Subcutaneous Q24H   escitalopram  20 mg Oral Daily   insulin aspart  0-20 Units Subcutaneous TID WC   insulin aspart  0-5 Units Subcutaneous QHS   insulin aspart  5 Units Subcutaneous TID WC   insulin glargine-yfgn  10 Units Subcutaneous QHS   insulin glargine-yfgn  35 Units Subcutaneous q morning   lisinopril  40 mg Oral Daily   polyethylene glycol  17 g Oral Daily   senna-docusate  1 tablet Oral BID   valACYclovir  1,000 mg Oral TID   Continuous Infusions:  sodium chloride 10 mL/hr at 12/02/20 2223   PRN Meds:.sodium chloride, HYDROmorphone (DILAUDID)  injection, hydrOXYzine, ondansetron **OR** ondansetron (ZOFRAN) IV, oxyCODONE-acetaminophen, traZODone  Diet Orders (From admission, onward)     Start     Ordered   12/03/20 1058  Diet Carb Modified Fluid consistency: Thin; Room service appropriate? Yes  Diet effective now       Question Answer Comment   Diet-HS Snack? Nothing   Calorie Level Medium 1600-2000   Fluid consistency: Thin   Room service appropriate? Yes      12/03/20 1058            DVT prophylaxis: enoxaparin (LOVENOX) injection 40 mg Start: 11/30/20 1945     Code Status: Full Code  Family Communication: No family at bedside  Status is: Inpatient  Remains inpatient: Requiring IV pain medications, general surgery consult  Level of care: Med-Surg  Consultants:  ID  Procedures:  none  Microbiology  Blood cultures 11/9-no growth to date Abscess-staff aureus, sensitivities pending   Antimicrobials: doxycycline Valtrex   Objective: Vitals:   12/05/20 1351 12/05/20 2205 12/06/20 0514 12/06/20 0953  BP: (!) 141/65 (!) 144/67 (!) 153/71 (!) 175/87  Pulse: 81 80 84 94  Resp: 18 17 17 16   Temp: 98.8 F (37.1 C) 98.1 F (36.7 C) 98 F (36.7 C) 97.9 F (36.6 C)  TempSrc: Oral Oral Oral Oral  SpO2: 100% 100% 97% 100%  Weight:      Height:        Intake/Output Summary (Last 24 hours) at 12/06/2020 1017 Last data filed at 12/06/2020 1000 Gross per 24 hour  Intake 1440 ml  Output 1765 ml  Net -325 ml    Filed Weights   11/30/20 1525  Weight: 131.5 kg    Examination:  Constitutional: He is in no distress Eyes: Anicteric ENMT: Moist mucous membranes Neck: normal, supple Respiratory: Lungs are clear bilaterally, no wheezing heard Cardiovascular: Regular rate and rhythm, no murmurs, no edema Abdomen: Nondistended Musculoskeletal: Right AKA Skin: Mild cellulitic rash surrounding a collection of multiple furuncles  Neurologic: No focal deficits    Data Reviewed: I have independently reviewed following labs and imaging studies   CBC: Recent Labs  Lab 11/30/20 1627 12/01/20 0315 12/02/20 0329 12/03/20 0413 12/04/20 1018  WBC 17.5* 15.6* 17.8* 15.1* 13.8*  NEUTROABS 14.4*  --   --   --   --   HGB 11.5* 10.6* 10.7* 10.7* 9.3*  HCT 35.4* 33.1* 32.9* 32.6* 29.1*  MCV 85.3 86.9  85.7 85.6 86.9  PLT 210 187 216 196 218    Basic Metabolic Panel: Recent Labs  Lab 12/01/20 0315 12/02/20 0329 12/03/20 0413 12/04/20 1021 12/05/20 0323  NA 132* 137 135 136 136  K 4.5 4.1 5.3* 4.1 4.0  CL 101 103 104 103 104  CO2 20* 24 20* 23 24  GLUCOSE 370* 89 102* 188* 143*  BUN 21* 27* 38* 28* 25*  CREATININE 1.23 1.47* 1.64* 1.23 1.20  CALCIUM 8.6* 8.9 8.9 8.7* 8.7*    Liver Function Tests: Recent Labs  Lab 12/03/20 0413  AST 37  ALT 27  ALKPHOS 126  BILITOT 0.3  PROT 7.3  ALBUMIN 2.9*    Coagulation Profile: No results for input(s): INR, PROTIME in the last 168 hours. HbA1C: No results for input(s): HGBA1C in the last 72 hours. CBG: Recent Labs  Lab 12/05/20 0934 12/05/20 1120 12/05/20 1606 12/05/20 2203 12/06/20 0722  GLUCAP 117* 172* 149* 253* 158*     Recent Results (from the past 240 hour(s))  Culture, blood (routine x 2)  Status: None   Collection Time: 11/30/20  4:28 PM   Specimen: BLOOD LEFT FOREARM  Result Value Ref Range Status   Specimen Description   Final    BLOOD LEFT FOREARM Performed at [facility] [address] [place], [place] [address]    Special Requests   Final    BOTTLES DRAWN AEROBIC AND ANAEROBIC Blood Culture results may not be optimal due to an inadequate volume of blood received in culture bottles Performed at [facility] [address], [place], [place] [address]    Culture   Final    NO GROWTH 5 DAYS Performed at [facility], [place] [address] [place], [place] [address]    Report Status 12/05/2020 FINAL  Final  Culture, blood (routine x 2)     Status: None   Collection Time: 11/30/20  4:33 PM   Specimen: BLOOD  Result Value Ref Range Status   Specimen Description   Final    BLOOD LEFT ANTECUBITAL Performed at [facility] [address], [place], [place] [address]    Special Requests   Final    BOTTLES DRAWN AEROBIC AND ANAEROBIC  Blood Culture results may not be optimal due to an inadequate volume of blood received in culture bottles Performed at [facility] [address], [place], [place] [address]    Culture   Final    NO GROWTH 5 DAYS Performed at [facility], [place] [address] [place], [place] [address]    Report Status 12/05/2020 FINAL  Final  Resp Panel by RT-PCR (Flu A&B, Covid) Nasopharyngeal Swab     Status: None   Collection Time: 11/30/20  6:23 PM   Specimen: Nasopharyngeal Swab; Nasopharyngeal(NP) swabs in vial transport medium  Result Value Ref Range Status   SARS Coronavirus 2 by RT PCR NEGATIVE NEGATIVE Final    Comment: (NOTE) SARS-CoV-2 target nucleic acids are NOT DETECTED.  The SARS-CoV-2 RNA is generally detectable in upper respiratory specimens during the acute phase of infection. The lowest concentration of SARS-CoV-2 viral copies this assay can detect is 138 copies/mL. A negative result does not preclude SARS-Cov-2 infection and should not be used as the sole basis for treatment or other patient management decisions. A negative result may occur with  improper specimen collection/handling, submission of specimen other than nasopharyngeal swab, presence of viral mutation(s) within the areas targeted by this assay, and inadequate number of viral copies(<138 copies/mL). A negative result must be combined with clinical observations, patient history, and epidemiological information. The expected result is Negative.  Fact Sheet for Patients:  EntrepreneurPulse.com.au  Fact Sheet for Healthcare Providers:  IncredibleEmployment.be  This test is no t yet approved or cleared by the [place] FDA and  has been authorized for detection and/or diagnosis of SARS-CoV-2 by FDA under an Emergency Use Authorization (EUA). This EUA will remain  in effect (meaning this test can be used) for the duration of the COVID-19 declaration under  Section 564(b)(1) of the Act, 21 U.S.C.section 360bbb-3(b)(1), unless the authorization is terminated  or revoked sooner.       Influenza A by PCR NEGATIVE NEGATIVE Final   Influenza B by PCR NEGATIVE NEGATIVE Final    Comment: (NOTE) The Xpert Xpress SARS-CoV-2/FLU/RSV plus assay is intended as an aid in the diagnosis of influenza from Nasopharyngeal swab specimens and should not be used as a sole basis for treatment. Nasal washings and aspirates are unacceptable for Xpert Xpress SARS-CoV-2/FLU/RSV  testing.  Fact Sheet for Patients: EntrepreneurPulse.com.au  Fact Sheet for Healthcare Providers: IncredibleEmployment.be  This test is not yet approved or cleared by the [place] FDA and has been authorized for detection and/or diagnosis of SARS-CoV-2 by FDA under an Emergency Use Authorization (EUA). This EUA will remain in effect (meaning this test can be used) for the duration of the COVID-19 declaration under Section 564(b)(1) of the Act, 21 U.S.C. section 360bbb-3(b)(1), unless the authorization is terminated or revoked.  Performed at [facility], [place] [address] [place], [place] [address]   Aerobic/Anaerobic Culture w Gram Stain (surgical/deep wound)     Status: None (Preliminary result)   Collection Time: 12/03/20  2:34 PM   Specimen: Back  Result Value Ref Range Status   Specimen Description   Final    BACK Performed at [facility] [address], [place], [place] [address]    Special Requests   Final    Normal Performed at [facility], [place] [address] [place], [place] [address]    Gram Stain   Final    RARE SQUAMOUS EPITHELIAL CELLS PRESENT RARE WBC PRESENT,BOTH PMN AND MONONUCLEAR RARE GRAM POSITIVE COCCI Performed at [facility], [place] [address], [place], [place] [address]    Culture   Final    FEW STAPHYLOCOCCUS AUREUS SUSCEPTIBILITIES TO FOLLOW NO  ANAEROBES ISOLATED; CULTURE IN PROGRESS FOR 5 DAYS    Report Status PENDING  Incomplete      Radiology Studies: No results found.   Marzetta Board, MD, PhD [facility]  Between 7 am - 7 pm I am available, please contact me via Amion (for emergencies) or Securechat (non urgent messages)  Between 7 pm - 7 am I am not available, please contact night coverage MD/APP via Amion

## 2020-12-07 DIAGNOSIS — L0390 Cellulitis, unspecified: Secondary | ICD-10-CM | POA: Diagnosis not present

## 2020-12-07 DIAGNOSIS — A419 Sepsis, unspecified organism: Secondary | ICD-10-CM | POA: Diagnosis not present

## 2020-12-07 LAB — CBC
HCT: 32.2 % — ABNORMAL LOW (ref 39.0–52.0)
Hemoglobin: 10.3 g/dL — ABNORMAL LOW (ref 13.0–17.0)
MCH: 27 pg (ref 26.0–34.0)
MCHC: 32 g/dL (ref 30.0–36.0)
MCV: 84.5 fL (ref 80.0–100.0)
Platelets: 278 10*3/uL (ref 150–400)
RBC: 3.81 MIL/uL — ABNORMAL LOW (ref 4.22–5.81)
RDW: 15.1 % (ref 11.5–15.5)
WBC: 10 10*3/uL (ref 4.0–10.5)
nRBC: 0 % (ref 0.0–0.2)

## 2020-12-07 LAB — VITAMIN D 25 HYDROXY (VIT D DEFICIENCY, FRACTURES): Vit D, 25-Hydroxy: 41.56 ng/mL (ref 30–100)

## 2020-12-07 LAB — GLUCOSE, CAPILLARY
Glucose-Capillary: 117 mg/dL — ABNORMAL HIGH (ref 70–99)
Glucose-Capillary: 181 mg/dL — ABNORMAL HIGH (ref 70–99)
Glucose-Capillary: 195 mg/dL — ABNORMAL HIGH (ref 70–99)
Glucose-Capillary: 233 mg/dL — ABNORMAL HIGH (ref 70–99)

## 2020-12-07 LAB — BASIC METABOLIC PANEL
Anion gap: 8 (ref 5–15)
BUN: 19 mg/dL (ref 6–20)
CO2: 26 mmol/L (ref 22–32)
Calcium: 9.2 mg/dL (ref 8.9–10.3)
Chloride: 101 mmol/L (ref 98–111)
Creatinine, Ser: 1.13 mg/dL (ref 0.61–1.24)
GFR, Estimated: >60 mL/min (ref >60)
Glucose, Bld: 144 mg/dL — ABNORMAL HIGH (ref 70–99)
Potassium: 4.2 mmol/L (ref 3.5–5.1)
Sodium: 135 mmol/L (ref 135–145)

## 2020-12-07 LAB — VARICELLA-ZOSTER BY PCR: Varicella-Zoster, PCR: NEGATIVE

## 2020-12-07 MED ORDER — METOPROLOL TARTRATE 5 MG/5ML IV SOLN: 5.0000 mg | INTRAVENOUS | Status: DC | PRN

## 2020-12-07 MED ORDER — HYDRALAZINE HCL 20 MG/ML IJ SOLN: 10.0000 mg | INTRAMUSCULAR | Status: DC | PRN

## 2020-12-07 MED ORDER — GUAIFENESIN 100 MG/5ML PO LIQD: 5.0000 mL | ORAL | Status: DC | PRN

## 2020-12-07 NOTE — Plan of Care (Signed)
  Problem: Clinical Measurements: Goal: Ability to maintain clinical measurements within normal limits will improve Outcome: Progressing   Problem: Clinical Measurements: Goal: Will remain free from infection Outcome: Progressing   Problem: Activity: Goal: Risk for activity intolerance will decrease Outcome: Progressing   Problem: Pain Managment: Goal: General experience of comfort will improve Outcome: Progressing   

## 2020-12-07 NOTE — Progress Notes (Signed)
Patient ID: Mark [name], male   DOB: November 28, 1960, 60 y.o.   MRN: [id number] [facility] Surgery Progress Note     Subjective: CC-  No new complaints. Back is still sore but no worsening pain. WBC 10  Objective: Vital signs in last 24 hours: Temp:  [97.7 F (36.5 C)-98.6 F (37 C)] 97.9 F (36.6 C) (11/16 0617) Pulse Rate:  [77-94] 77 (11/16 0617) Resp:  [16-18] 16 (11/16 0617) BP: (123-175)/(69-88) 167/88 (11/16 0617) SpO2:  [95 %-100 %] 95 % (11/16 0617) Last BM Date: 12/06/20  Intake/Output from previous day: 11/15 0701 - 11/16 0700 In: 1320 [P.O.:1320] Out: 2040 [Urine:2040] Intake/Output this shift: No intake/output data recorded.  PE: General appearance: alert, NAD Resp: rate and effort normal GI: soft, nontender Skin: warm and dry Back: lower, central back with carbuncle that is crusting. No obvious fluctuance or expressed purulence. Surrounding erythema and induration stable  Lab Results:  Recent Labs    12/04/20 1018 12/07/20 0325  WBC 13.8* 10.0  HGB 9.3* 10.3*  HCT 29.1* 32.2*  PLT 218 278   BMET Recent Labs    12/05/20 0323 12/07/20 0325  NA 136 135  K 4.0 4.2  CL 104 101  CO2 24 26  GLUCOSE 143* 144*  BUN 25* 19  CREATININE 1.20 1.13  CALCIUM 8.7* 9.2   PT/INR No results for input(s): LABPROT, INR in the last 72 hours. CMP     Component Value Date/Time   NA 135 12/07/2020 0325   K 4.2 12/07/2020 0325   CL 101 12/07/2020 0325   CO2 26 12/07/2020 0325   GLUCOSE 144 (H) 12/07/2020 0325   BUN 19 12/07/2020 0325   CREATININE 1.13 12/07/2020 0325   CALCIUM 9.2 12/07/2020 0325   PROT 7.3 12/03/2020 0413   ALBUMIN 2.9 (L) 12/03/2020 0413   AST 37 12/03/2020 0413   ALT 27 12/03/2020 0413   ALKPHOS 126 12/03/2020 0413   BILITOT 0.3 12/03/2020 0413   GFRNONAA >60 12/07/2020 0325   GFRAA >60 12/26/2017 2204   Lipase     Component Value Date/Time   LIPASE 159 (H) 12/26/2017 2204       Studies/Results: No results  found.  Anti-infectives: Anti-infectives (From admission, onward)    Start     Dose/Rate Route Frequency Ordered Stop   12/03/20 2000  doxycycline (VIBRA-TABS) tablet 100 mg        100 mg Oral Every 12 hours 12/03/20 1421     12/03/20 1000  sulfamethoxazole-trimethoprim (BACTRIM DS) 800-160 MG per tablet 1 tablet  Status:  Discontinued        1 tablet Oral Every 12 hours 12/03/20 0706 12/03/20 1421   12/02/20 2200  sulfamethoxazole-trimethoprim (BACTRIM DS) 800-160 MG per tablet 2 tablet  Status:  Discontinued        2 tablet Oral Every 12 hours 12/02/20 1530 12/03/20 0707   12/01/20 1600  valACYclovir (VALTREX) tablet 1,000 mg        1,000 mg Oral 3 times daily 12/01/20 1453 12/10/20 2359   12/01/20 1230  cefTRIAXone (ROCEPHIN) 2 g in sodium chloride 0.9 % 100 mL IVPB  Status:  Discontinued        2 g 200 mL/hr over 30 Minutes Intravenous Every 24 hours 12/01/20 1134 12/02/20 1530   12/01/20 0600  vancomycin (VANCOCIN) IVPB 1000 mg/200 mL premix  Status:  Discontinued        1,000 mg 200 mL/hr over 60 Minutes Intravenous Every 12 hours 11/30/20 2037 12/02/20 1530  11/30/20 1815  vancomycin (VANCOREADY) IVPB 2000 mg/400 mL        2,000 mg 200 mL/hr over 120 Minutes Intravenous  Once 11/30/20 1804 11/30/20 2103        Assessment/Plan Lower back cellulitis - There is no obvious undrained fluid collection on exam or CT. WBC has normalized. Continues with slow improvement on oral antibiotics. Would not recommend any operative intervention. I think he can go home on prolonged course of oral antibiotics and follow up in our office for recheck.   - Abx per ID;  Doxycycline/Valtrex.   Hx of left inferior back abscess drainage by Dr. Carolynne Edouard on 9/25  - Healing well. Continue WTD dressing changes.    FEN - HH VTE - SCDs, lovenox ID - Rocephin/Vanc 11/9-11/13, clindamycin topical 11/1 >>,  Valtrex 11/10>>, doxycycline 11/12 >>   IDDM2 HTN HLD R AKA   LOS: 6 days    Franne Forts,  [facility] Surgery 12/07/2020, 9:30 AM Please see Amion for pager number during day hours 7:00am-4:30pm

## 2020-12-07 NOTE — Progress Notes (Signed)
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  He was recently admitted 6 weeks prior to admission for similar reasons requiring I&D on 9/25 which eventually grew MRSA.  He was eventually sent home on Augmentin.  Few days ago he started having similar lesions which were very painful therefore came back to the hospital.  Infectious disease and general surgery were consulted.   Assessment & Plan:   Principal Problem:   Sepsis due to cellulitis [facility]) Active Problems:   Major depressive disorder, recurrent episode, moderate (HCC)   Generalized anxiety disorder   Hypertension associated with diabetes ([place])   Hyperlipidemia associated with type 2 diabetes mellitus ([place])   Cellulitis of lower back   Cellulitis  Sepsis secondary to lower back cellulitis with questionable abscess, POA - Sepsis etiology has improved.  Seen by infectious disease, placed on vancomycin and then Bactrim now changed to doxycycline.  End date 11/19. - Also on Valtrex for concurrent viral treatment - General surgery following-monitoring clinically.  May need I&D - Follow-up culture data.  HSV/varicella-PCR negative  Essential hypertension - Coreg and lisinopril  Depression/anxiety - On Wellbutrin, BuSpar  Diabetes mellitus type 2, poorly controlled secondary to hyperglycemia - A1c 9.9.  Semglee 10 units at bedtime, 35 units in the morning.  Sliding scale Accu-Cheks.  OSA - On CPAP  CKD stage IIIa - Creatinine baseline around 1.3  Hyperlipidemia - Statin     DVT prophylaxis: Lovenox Code Status: Full code Family Communication:    Status is: Inpatient  Remains inpatient appropriate because:  Maintain hospital stay until cleared by general surgery, pain is controlled.    Subjective: Still having pain in his lower back especially he states when he sits up.  Review of Systems Otherwise negative except as per HPI, including: General: Denies fever, chills, night sweats or unintended weight loss. Resp: Denies cough, wheezing, shortness of breath. Cardiac: Denies chest pain, palpitations, orthopnea, paroxysmal nocturnal dyspnea. GI: Denies abdominal pain, nausea, vomiting, diarrhea or constipation GU: Denies dysuria, frequency, hesitancy or incontinence MS: Denies muscle aches, joint pain or swelling Neuro: Denies headache, neurologic deficits (focal weakness, numbness, tingling), abnormal gait Psych: Denies anxiety, depression, SI/HI/AVH Skin: Denies new rashes or lesions ID: Denies sick contacts, exotic exposures, travel  Examination:  General exam: Appears calm and comfortable  Respiratory system: Clear to auscultation. Respiratory effort normal. Cardiovascular system: S1 & S2 heard, RRR. No JVD, murmurs, rubs, gallops or clicks. No pedal edema. Gastrointestinal system: Abdomen is nondistended, soft and nontender. No organomegaly or masses felt. Normal bowel sounds heard. Central nervous system: Alert and oriented. No focal neurological deficits. Extremities: Symmetric 5 x 5 power. Skin: Lower back area of carbuncle/superficial cellulitis without obvious evidence of fluid collection.  Area is tender to touch Psychiatry: Judgement and insight appear normal. Mood & affect appropriate.     Objective: Vitals:   12/06/20 1425 12/06/20 1822 12/06/20 2151 12/07/20 0617  BP: 123/85 (!) 151/69 (!) 163/69 (!) 167/88  Pulse: 86 91 84 77  Resp: 17 18 16 16   Temp: 98.6 F (37 C) 97.7 F (36.5 C) 98 F (36.7 C) 97.9 F (36.6 C)  TempSrc: Oral Oral Oral Oral  SpO2: 100% 100% 96% 95%  Weight:  Height:        Intake/Output Summary (Last 24 hours) at 12/07/2020 0714 Last  data filed at 12/07/2020 0600 Gross per 24 hour  Intake 1320 ml  Output 2040 ml  Net -720 ml   Filed Weights   11/30/20 1525  Weight: 131.5 kg     Data Reviewed:   CBC: Recent Labs  Lab 11/30/20 1627 12/01/20 0315 12/02/20 0329 12/03/20 0413 12/04/20 1018 12/07/20 0325  WBC 17.5* 15.6* 17.8* 15.1* 13.8* 10.0  NEUTROABS 14.4*  --   --   --   --   --   HGB 11.5* 10.6* 10.7* 10.7* 9.3* 10.3*  HCT 35.4* 33.1* 32.9* 32.6* 29.1* 32.2*  MCV 85.3 86.9 85.7 85.6 86.9 84.5  PLT 210 187 216 196 218 0000000   Basic Metabolic Panel: Recent Labs  Lab 12/02/20 0329 12/03/20 0413 12/04/20 1021 12/05/20 0323 12/07/20 0325  NA 137 135 136 136 135  K 4.1 5.3* 4.1 4.0 4.2  CL 103 104 103 104 101  CO2 24 20* 23 24 26   GLUCOSE 89 102* 188* 143* 144*  BUN 27* 38* 28* 25* 19  CREATININE 1.47* 1.64* 1.23 1.20 1.13  CALCIUM 8.9 8.9 8.7* 8.7* 9.2   GFR: Estimated Creatinine Clearance: 100.2 mL/min (by C-G formula based on SCr of 1.13 mg/dL). Liver Function Tests: Recent Labs  Lab 12/03/20 0413  AST 37  ALT 27  ALKPHOS 126  BILITOT 0.3  PROT 7.3  ALBUMIN 2.9*   No results for input(s): LIPASE, AMYLASE in the last 168 hours. No results for input(s): AMMONIA in the last 168 hours. Coagulation Profile: No results for input(s): INR, PROTIME in the last 168 hours. Cardiac Enzymes: No results for input(s): CKTOTAL, CKMB, CKMBINDEX, TROPONINI in the last 168 hours. BNP (last 3 results) No results for input(s): PROBNP in the last 8760 hours. HbA1C: No results for input(s): HGBA1C in the last 72 hours. CBG: Recent Labs  Lab 12/05/20 2203 12/06/20 0722 12/06/20 1136 12/06/20 1614 12/06/20 2149  GLUCAP 253* 158* 212* 265* 177*   Lipid Profile: No results for input(s): CHOL, HDL, LDLCALC, TRIG, CHOLHDL, LDLDIRECT in the last 72 hours. Thyroid Function Tests: No results for input(s): TSH, T4TOTAL, FREET4, T3FREE, THYROIDAB in the last 72 hours. Anemia Panel: No results for  input(s): VITAMINB12, FOLATE, FERRITIN, TIBC, IRON, RETICCTPCT in the last 72 hours. Sepsis Labs: Recent Labs  Lab 11/30/20 1627 11/30/20 1648 11/30/20 1828  PROCALCITON 0.36  --   --   LATICACIDVEN  --  1.9 1.3    Recent Results (from the past 240 hour(s))  Culture, blood (routine x 2)     Status: None   Collection Time: 11/30/20  4:28 PM   Specimen: BLOOD LEFT FOREARM  Result Value Ref Range Status   Specimen Description   Final    BLOOD LEFT FOREARM Performed at [facility], [place] [address], [place], [place] [address]    Special Requests   Final    BOTTLES DRAWN AEROBIC AND ANAEROBIC Blood Culture results may not be optimal due to an inadequate volume of blood received in culture bottles Performed at [facility] [address], [place], [place] [address]    Culture   Final    NO GROWTH 5 DAYS Performed at [facility], [place] [address] [place], [place] [address]    Report Status 12/05/2020 FINAL  Final  Culture, blood (routine x 2)     Status: None   Collection Time:  11/30/20  4:33 PM   Specimen: BLOOD  Result Value Ref Range Status   Specimen Description   Final    BLOOD LEFT ANTECUBITAL Performed at [facility], [address], [place], [place] [address]    Special Requests   Final    BOTTLES DRAWN AEROBIC AND ANAEROBIC Blood Culture results may not be optimal due to an inadequate volume of blood received in culture bottles Performed at [facility], [address], [place], [place] [address]    Culture   Final    NO GROWTH 5 DAYS Performed at [facility], [address], [place], [place] [address]    Report Status 12/05/2020 FINAL  Final  Resp Panel by RT-PCR (Flu A&B, Covid) Nasopharyngeal Swab     Status: None   Collection Time: 11/30/20  6:23 PM   Specimen: Nasopharyngeal Swab; Nasopharyngeal(NP) swabs in vial transport medium  Result Value Ref Range Status   SARS  Coronavirus 2 by RT PCR NEGATIVE NEGATIVE Final    Comment: (NOTE) SARS-CoV-2 target nucleic acids are NOT DETECTED.  The SARS-CoV-2 RNA is generally detectable in upper respiratory specimens during the acute phase of infection. The lowest concentration of SARS-CoV-2 viral copies this assay can detect is 138 copies/mL. A negative result does not preclude SARS-Cov-2 infection and should not be used as the sole basis for treatment or other patient management decisions. A negative result may occur with  improper specimen collection/handling, submission of specimen other than nasopharyngeal swab, presence of viral mutation(s) within the areas targeted by this assay, and inadequate number of viral copies(<138 copies/mL). A negative result must be combined with clinical observations, patient history, and epidemiological information. The expected result is Negative.  Fact Sheet for Patients:  BloggerCourse.com  Fact Sheet for Healthcare Providers:  SeriousBroker.it  This test is no t yet approved or cleared by the [place] FDA and  has been authorized for detection and/or diagnosis of SARS-CoV-2 by FDA under an Emergency Use Authorization (EUA). This EUA will remain  in effect (meaning this test can be used) for the duration of the COVID-19 declaration under Section 564(b)(1) of the Act, 21 U.S.C.section 360bbb-3(b)(1), unless the authorization is terminated  or revoked sooner.       Influenza A by PCR NEGATIVE NEGATIVE Final   Influenza B by PCR NEGATIVE NEGATIVE Final    Comment: (NOTE) The Xpert Xpress SARS-CoV-2/FLU/RSV plus assay is intended as an aid in the diagnosis of influenza from Nasopharyngeal swab specimens and should not be used as a sole basis for treatment. Nasal washings and aspirates are unacceptable for Xpert Xpress SARS-CoV-2/FLU/RSV testing.  Fact Sheet for  Patients: BloggerCourse.com  Fact Sheet for Healthcare Providers: SeriousBroker.it  This test is not yet approved or cleared by the [place] FDA and has been authorized for detection and/or diagnosis of SARS-CoV-2 by FDA under an Emergency Use Authorization (EUA). This EUA will remain in effect (meaning this test can be used) for the duration of the COVID-19 declaration under Section 564(b)(1) of the Act, 21 U.S.C. section 360bbb-3(b)(1), unless the authorization is terminated or revoked.  Performed at [facility], [address], [place], [place] [address]   Varicella-zoster by PCR (Blood or Swab)     Status: None   Collection Time: 12/03/20  3:00 AM   Specimen: Sterile Swab  Result Value Ref Range Status   Varicella-Zoster, PCR Negative Negative Final    Comment: (NOTE) No Varicella Zoster Virus DNA detected.  This test was developed and its performance characteristics determined by [facility].  It has not been cleared or approved by the Food and Drug Administration.  The FDA has determined that such clearance or approval is not necessary. Performed At: [facility] [place], [place] [id number] Rush Farmer MD [id number]   Aerobic/Anaerobic Culture w Gram Stain (surgical/deep wound)     Status: None (Preliminary result)   Collection Time: 12/03/20  2:34 PM   Specimen: Back  Result Value Ref Range Status   Specimen Description   Final    BACK Performed at [employer] [address], [place], [place] [address]    Special Requests   Final    Normal Performed at [facility], [place] [address], [place], [place] [address]    Gram Stain   Final    RARE SQUAMOUS EPITHELIAL CELLS PRESENT RARE WBC PRESENT,BOTH PMN AND MONONUCLEAR RARE GRAM POSITIVE COCCI Performed at [facility], [place] [address], [place], [place] [address]    Culture    Final    FEW METHICILLIN RESISTANT STAPHYLOCOCCUS AUREUS NO ANAEROBES ISOLATED; CULTURE IN PROGRESS FOR 5 DAYS    Report Status PENDING  Incomplete   Organism ID, Bacteria METHICILLIN RESISTANT STAPHYLOCOCCUS AUREUS  Final      Susceptibility   Methicillin resistant staphylococcus aureus - MIC*    CIPROFLOXACIN >=8 RESISTANT Resistant     ERYTHROMYCIN >=8 RESISTANT Resistant     GENTAMICIN <=0.5 SENSITIVE Sensitive     OXACILLIN >=4 RESISTANT Resistant     TETRACYCLINE <=1 SENSITIVE Sensitive     VANCOMYCIN <=0.5 SENSITIVE Sensitive     TRIMETH/SULFA <=10 SENSITIVE Sensitive     CLINDAMYCIN <=0.25 SENSITIVE Sensitive     RIFAMPIN <=0.5 SENSITIVE Sensitive     Inducible Clindamycin NEGATIVE Sensitive     * FEW METHICILLIN RESISTANT STAPHYLOCOCCUS AUREUS         Radiology Studies: No results found.      Scheduled Meds:  ARIPiprazole  10 mg Oral Daily   atorvastatin  80 mg Oral Daily   buPROPion  300 mg Oral Daily   busPIRone  15 mg Oral TID   carvedilol  25 mg Oral BID WC   clindamycin   Topical BID   doxycycline  100 mg Oral Q12H   enoxaparin (LOVENOX) injection  40 mg Subcutaneous Q24H   escitalopram  20 mg Oral Daily   insulin aspart  0-20 Units Subcutaneous TID WC   insulin aspart  0-5 Units Subcutaneous QHS   insulin aspart  5 Units Subcutaneous TID WC   insulin glargine-yfgn  10 Units Subcutaneous QHS   insulin glargine-yfgn  35 Units Subcutaneous q morning   lisinopril  40 mg Oral Daily   polyethylene glycol  17 g Oral Daily   senna-docusate  1 tablet Oral BID   valACYclovir  1,000 mg Oral TID   Continuous Infusions:  sodium chloride 10 mL/hr at 12/02/20 2223     LOS: 6 days   Time spent= 35 mins    Kardell Virgil Arsenio Loader, MD [facility]  If 7PM-7AM, please contact night-coverage  12/07/2020, 7:14 AM

## 2020-12-07 NOTE — Progress Notes (Signed)
Pt continues to refuse CPAP QHS.  

## 2020-12-08 ENCOUNTER — Encounter (HOSPITAL_COMMUNITY): Payer: Self-pay | Admitting: Licensed Clinical Social Worker

## 2020-12-08 ENCOUNTER — Other Ambulatory Visit (HOSPITAL_COMMUNITY): Payer: Self-pay

## 2020-12-08 ENCOUNTER — Ambulatory Visit (INDEPENDENT_AMBULATORY_CARE_PROVIDER_SITE_OTHER): Payer: No Typology Code available for payment source | Admitting: Licensed Clinical Social Worker

## 2020-12-08 DIAGNOSIS — F4310 Post-traumatic stress disorder, unspecified: Secondary | ICD-10-CM

## 2020-12-08 DIAGNOSIS — A419 Sepsis, unspecified organism: Secondary | ICD-10-CM | POA: Diagnosis not present

## 2020-12-08 DIAGNOSIS — L0390 Cellulitis, unspecified: Secondary | ICD-10-CM | POA: Diagnosis not present

## 2020-12-08 DIAGNOSIS — F419 Anxiety disorder, unspecified: Secondary | ICD-10-CM

## 2020-12-08 DIAGNOSIS — F331 Major depressive disorder, recurrent, moderate: Secondary | ICD-10-CM

## 2020-12-08 LAB — BASIC METABOLIC PANEL
Anion gap: 8 (ref 5–15)
BUN: 20 mg/dL (ref 6–20)
CO2: 24 mmol/L (ref 22–32)
Calcium: 8.9 mg/dL (ref 8.9–10.3)
Chloride: 102 mmol/L (ref 98–111)
Creatinine, Ser: 1.03 mg/dL (ref 0.61–1.24)
GFR, Estimated: >60 mL/min (ref >60)
Glucose, Bld: 224 mg/dL — ABNORMAL HIGH (ref 70–99)
Potassium: 4.2 mmol/L (ref 3.5–5.1)
Sodium: 134 mmol/L — ABNORMAL LOW (ref 135–145)

## 2020-12-08 LAB — CBC
HCT: 33.9 % — ABNORMAL LOW (ref 39.0–52.0)
Hemoglobin: 11 g/dL — ABNORMAL LOW (ref 13.0–17.0)
MCH: 27.7 pg (ref 26.0–34.0)
MCHC: 32.4 g/dL (ref 30.0–36.0)
MCV: 85.4 fL (ref 80.0–100.0)
Platelets: 290 10*3/uL (ref 150–400)
RBC: 3.97 MIL/uL — ABNORMAL LOW (ref 4.22–5.81)
RDW: 15.1 % (ref 11.5–15.5)
WBC: 9.9 10*3/uL (ref 4.0–10.5)
nRBC: 0 % (ref 0.0–0.2)

## 2020-12-08 LAB — GLUCOSE, CAPILLARY
Glucose-Capillary: 207 mg/dL — ABNORMAL HIGH (ref 70–99)
Glucose-Capillary: 173 mg/dL — ABNORMAL HIGH (ref 70–99)

## 2020-12-08 LAB — AEROBIC/ANAEROBIC CULTURE W GRAM STAIN (SURGICAL/DEEP WOUND): Special Requests: NORMAL

## 2020-12-08 LAB — MAGNESIUM: Magnesium: 1.9 mg/dL (ref 1.7–2.4)

## 2020-12-08 MED ORDER — VALACYCLOVIR HCL 1 G PO TABS
1000.0000 mg | ORAL_TABLET | Freq: Three times a day (TID) | ORAL | 0 refills | Status: AC
  Filled 2020-12-08: qty 6, 2d supply, fill #0

## 2020-12-08 MED ORDER — DOXYCYCLINE HYCLATE 100 MG PO TABS
100.0000 mg | ORAL_TABLET | Freq: Two times a day (BID) | ORAL | 0 refills | Status: AC
  Filled 2020-12-08: qty 28, 14d supply, fill #0

## 2020-12-08 NOTE — Discharge Summary (Signed)
Physician Discharge Summary  Mano Situ ZOX:[id number] DOB: 08/13/1960 DOA: 11/30/2020  PCP: [name]  Admit date: 11/30/2020 Discharge date: 12/08/2020  Admitted From: Home Disposition: Home  Recommendations for Outpatient Follow-up:  Follow up with PCP in 1-2 weeks Please obtain BMP/CBC in one week your next doctors visit.  Outpatient follow-up with general surgery to be arranged by their service 2 more days of oral Valtrex prescribed. 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  He was recently admitted 6 weeks prior to admission for similar reasons requiring I&D on 9/25 which eventually grew MRSA.  He was eventually sent home on Augmentin.  Few days ago he started having similar lesions which were very painful therefore came back to the hospital.  Infectious disease and general surgery were consulted.  He was managed conservatively, eventually his antibiotics were switched to oral.  He will require 2 more days of oral Valtrex on discharge and 2 weeks of oral doxycycline.  Outpatient follow-up with general surgeon will be arranged by their service. Patient is medically stable for discharge today.     Assessment & Plan:   Principal Problem:   Sepsis due to cellulitis [facility]) Active Problems:   Major depressive disorder, recurrent episode, moderate (HCC)   Generalized anxiety disorder   Hypertension associated with diabetes (HCC)   Hyperlipidemia associated with type 2 diabetes mellitus (HCC)   Cellulitis of lower back   Cellulitis   Sepsis secondary to  lower back cellulitis with questionable abscess, POA - Sepsis etiology has improved.  Seen by infectious disease, placed on vancomycin and then Bactrim now changed to doxycycline.  Also discussed case with general surgery.  Last day of oral Valtrex 11/19, continue doxycycline for 2 more weeks. - General surgery following-cleared by general surgery for outpatient follow-up. - Follow-up culture data.  HSV/varicella-PCR negative   Essential hypertension - Coreg and lisinopril   Depression/anxiety - On Wellbutrin, BuSpar   Diabetes mellitus type 2, poorly controlled secondary to hyperglycemia - A1c 9.9.  Resume home regimen, would benefit from outpatient follow-up with endocrinology   OSA - On CPAP   CKD stage IIIa - Creatinine baseline around 1.3   Hyperlipidemia - Statin    Body mass index is 37.23 kg/m.         Discharge Diagnoses:  Principal Problem:   Sepsis due to cellulitis [facility]) Active Problems:   Major depressive disorder, recurrent episode, moderate (HCC)   Generalized anxiety disorder   Hypertension associated with diabetes (HCC)   Hyperlipidemia associated with type 2 diabetes mellitus (HCC)   Cellulitis of lower back   Cellulitis      Consultations: General surgery Infectious disease  Subjective: Feels well no complaints.  Wants to go home.  Discharge Exam: Vitals:   12/07/20 2153 12/08/20 0557  BP: 129/73 (!) 155/77  Pulse: 86 82  Resp: 16 16  Temp: 98.7 F (37.1 C) 97.8 F (36.6 C)  SpO2: 97% 98%   Vitals:   12/07/20 1415 12/07/20 1415 12/07/20 2153 12/08/20 0557  BP: (!) 147/64 (!) 147/64 129/73 (!) 155/77  Pulse: 84 85 86 82  Resp: 17 17 16 16   Temp: 97.9 F (36.6 C)  97.9 F (36.6 C) 98.7 F (37.1 C) 97.8 F (36.6 C)  TempSrc:   Oral Oral  SpO2: 100% 100% 97% 98%  Weight:      Height:        General: Pt is alert, awake, not in acute distress Cardiovascular: RRR, S1/S2 +, no rubs, no gallops Respiratory: CTA bilaterally,  no wheezing, no rhonchi Abdominal: Soft, NT, ND, bowel sounds + Extremities: no edema, no cyanosis Dressing on his lower back in place which appears to be clean  Discharge Instructions   Allergies as of 12/08/2020       Reactions   Ozempic (0.25 Or 0.5 Mg-dose) [semaglutide(0.25 Or 0.5mg -dos)] Other (See Comments)   Caused Pancreatitis   Semaglutide Other (See Comments)   Caused pancreatitis   Lyrica [pregabalin] Rash        Medication List     TAKE these medications    ARIPiprazole 10 MG tablet Commonly known as: ABILIFY Take 10 mg by mouth daily.   atorvastatin 80 MG tablet Commonly known as: LIPITOR Take 80 mg by mouth daily.   buPROPion 300 MG 24 hr tablet Commonly known as: WELLBUTRIN XL Take 1 tablet (300 mg total) by mouth daily.   busPIRone 15 MG tablet Commonly known as: BUSPAR Take 1 tablet (15 mg total) by mouth 3 (three) times daily.   carvedilol 25 MG tablet Commonly known as: COREG Take 25 mg by mouth 2 (two) times daily with a meal.   cyclobenzaprine 10 MG tablet Commonly known as: FLEXERIL Take 10 mg by mouth daily as needed for muscle spasms.   diclofenac Sodium 1 % Gel Commonly known as: VOLTAREN Apply 2 g topically daily as needed (pain).   doxycycline 100 MG tablet Commonly known as: VIBRA-TABS Take 1 tablet (100 mg total) by mouth every 12 (twelve) hours for 14 days.   ergocalciferol 1.25 MG (50000 UT) capsule Commonly known as: VITAMIN D2 Take 50,000 Units by mouth once a week.   escitalopram 20 MG tablet Commonly known as: LEXAPRO Take 20 mg by mouth daily.   hydrOXYzine 25 MG capsule Commonly known as: VISTARIL Take 25-50 mg by mouth daily as needed for anxiety.   insulin aspart 100 UNIT/ML injection Commonly known as: novoLOG Inject 35 Units into the skin 3 (three) times daily before meals.   insulin glargine 100 UNIT/ML injection Commonly known as: LANTUS Inject 0.4 mLs (40 Units total) into the skin daily. What  changed:  how much to take additional instructions   lisinopril 40 MG tablet Commonly known as: ZESTRIL Take 40 mg by mouth daily.   oxyCODONE-acetaminophen 5-325 MG tablet Commonly known as: PERCOCET/ROXICET Take 2 tablets by mouth every 4 (four) hours as needed for severe pain.   pioglitazone 15 MG tablet Commonly known as: ACTOS Take 15 mg by mouth daily.   prazosin 2 MG capsule Commonly known as: MINIPRESS Take 1 capsule (2 mg total) by mouth at bedtime.   sildenafil 100 MG tablet Commonly known as: VIAGRA Take 100 mg by mouth daily as needed for erectile dysfunction.   traZODone 50 MG tablet Commonly known as: DESYREL Take 1 tablet (50 mg total) by mouth at bedtime as needed. What changed: reasons to take this   valACYclovir 1000 MG tablet Commonly known as: VALTREX Take 1 tablet (1,000 mg total) by mouth 3 (three) times daily for 2 days.        Follow-up [facility], [place]. Go on 12/20/2020.   Specialty:  General Surgery Why: Your appointment is 11/29 at 10am Please arrive 30 minutes prior to your appointment to check in and fill out paperwork. Bring photo ID and insurance information. Contact information: [address] [place] [address] [phone]        [facility] Follow up in 1 week(s).   Contact information: [address] [facility] [place] [address] [phone]                Allergies  Allergen Reactions   Ozempic (0.25 Or 0.5 Mg-Dose) [Semaglutide(0.25 Or 0.5mg -Dos)] Other (See Comments)    Caused Pancreatitis   Semaglutide Other (See Comments)    Caused pancreatitis   Lyrica [Pregabalin] Rash    You were cared for by a hospitalist during your hospital stay. If you have any questions about your discharge medications or the care you received while you were in the hospital after you are discharged, you can call the unit and asked to speak with  the hospitalist on call if the hospitalist that took care of you is not available. Once you are discharged, your primary care physician will handle any further medical issues. Please note that no refills for any discharge medications will be authorized once you are discharged, as it is imperative that you return to your primary care physician (or establish a relationship with a primary care physician if you do not have one) for your aftercare needs so that they can reassess your need for medications and monitor your lab values.   Procedures/Studies: CT ABDOMEN PELVIS W CONTRAST  Result Date: 12/01/2020 CLINICAL DATA:  Low back pain. EXAM: CT ABDOMEN AND PELVIS WITH CONTRAST TECHNIQUE: Multidetector CT imaging of the abdomen and pelvis was performed using the standard protocol following bolus administration of intravenous contrast. CONTRAST:  37mL OMNIPAQUE IOHEXOL 350 MG/ML SOLN COMPARISON:  October 14, 2020. FINDINGS: Lower chest: No acute abnormality. Hepatobiliary: Small gallstone is noted. No biliary dilatation is noted. The liver is unremarkable. Pancreas: Unremarkable. No pancreatic ductal dilatation or surrounding inflammatory changes. Spleen: Normal in size without focal abnormality. Adrenals/Urinary Tract: Adrenal glands appear normal. Small bilateral renal cysts are noted. No hydronephrosis or renal obstruction is noted. No renal or ureteral calculi are noted. Urinary bladder is unremarkable. Stomach/Bowel: Stomach is within normal limits. Appendix appears normal. No evidence of bowel wall thickening, distention, or inflammatory changes. Vascular/Lymphatic: Aortic atherosclerosis. Stable bilateral iliac and inguinal adenopathy is noted which most likely is reactive in etiology. Reproductive: Prostate is unremarkable. Other: No abdominal wall hernia. Stable ill-defined density is noted in the subcutaneous tissues posterior to the cervical near the superior portion of the gluteal cleft. No  abdominopelvic ascites. Musculoskeletal: No acute or significant osseous findings. IMPRESSION: Small gallstone. Stable bilateral iliac and inguinal adenopathy is noted which most likely is reactive in etiology. Stable ill-defined density is seen in the subcutaneous tissues posterior to the sacrum near the superior portion of the gluteal cleft. Possible abscess cannot be excluded. Aortic Atherosclerosis (ICD10-I70.0). Electronically Signed   By: Lupita Raider M.D.   On: 12/01/2020 17:43     The results of significant diagnostics from this hospitalization (including imaging, microbiology, ancillary and laboratory) are listed below for reference.     Microbiology: Recent Results (from the past 240 hour(s))  Culture, blood (routine x 2)     Status: None   Collection Time: 11/30/20  4:28 PM   Specimen: BLOOD LEFT FOREARM  Result Value Ref Range Status   Specimen Description  Final    BLOOD LEFT FOREARM Performed at [facility] [address], [place], [name] [address]    Special Requests   Final    BOTTLES DRAWN AEROBIC AND ANAEROBIC Blood Culture results may not be optimal due to an inadequate volume of blood received in culture bottles Performed at [facility] [address] [place], [place] [address]    Culture   Final    NO GROWTH 5 DAYS Performed at [facility], [place] [address], [place], [place] [address]    Report Status 12/05/2020 FINAL  Final  Culture, blood (routine x 2)     Status: None   Collection Time: 11/30/20  4:33 PM   Specimen: BLOOD  Result Value Ref Range Status   Specimen Description   Final    BLOOD LEFT ANTECUBITAL Performed at [facility] [address], [place], [place] [address]    Special Requests   Final    BOTTLES DRAWN AEROBIC AND ANAEROBIC Blood Culture results may not be optimal due to an inadequate volume of blood received in culture bottles Performed at [facility] [address], [place], [place] [address]    Culture   Final    NO GROWTH 5 DAYS Performed at [facility], [place] [address] [place], [place] [address]    Report Status 12/05/2020 FINAL  Final  Resp Panel by RT-PCR (Flu A&B, Covid) Nasopharyngeal Swab     Status: None   Collection Time: 11/30/20  6:23 PM   Specimen: Nasopharyngeal Swab; Nasopharyngeal(NP) swabs in vial transport medium  Result Value Ref Range Status   SARS Coronavirus 2 by RT PCR NEGATIVE NEGATIVE Final    Comment: (NOTE) SARS-CoV-2 target nucleic acids are NOT DETECTED.  The SARS-CoV-2 RNA is generally detectable in upper respiratory specimens during the acute phase of infection. The lowest concentration of SARS-CoV-2 viral copies this assay can detect is 138 copies/mL. A negative result does not preclude SARS-Cov-2 infection and should not be used as the sole basis for treatment or other patient management decisions. A negative result may occur with  improper specimen collection/handling, submission of specimen other than nasopharyngeal swab, presence of viral mutation(s) within the areas targeted by this assay, and inadequate number of viral copies(<138 copies/mL). A negative result must be combined with clinical observations, patient history, and epidemiological information. The expected result is Negative.  Fact Sheet for Patients:  EntrepreneurPulse.com.au  Fact Sheet for Healthcare Providers:  IncredibleEmployment.be  This test is no t yet approved or cleared by the [place] FDA and  has been authorized for detection and/or diagnosis of SARS-CoV-2 by FDA under an Emergency Use Authorization (EUA). This EUA will remain  in effect (meaning this test can be used) for the duration of the COVID-19 declaration under Section 564(b)(1) of the Act, 21 U.S.C.section 360bbb-3(b)(1), unless the authorization is terminated  or revoked sooner.        Influenza A by PCR NEGATIVE NEGATIVE Final   Influenza B by PCR NEGATIVE NEGATIVE Final    Comment: (NOTE) The Xpert Xpress SARS-CoV-2/FLU/RSV plus assay is intended as an aid in the diagnosis of influenza from Nasopharyngeal swab specimens and should not be used as a sole basis for treatment. Nasal washings and aspirates are unacceptable for Xpert Xpress SARS-CoV-2/FLU/RSV testing.  Fact Sheet for Patients: EntrepreneurPulse.com.au  Fact Sheet for Healthcare Providers: IncredibleEmployment.be  This test is not yet approved or cleared by the [place] FDA  and has been authorized for detection and/or diagnosis of SARS-CoV-2 by FDA under an Emergency Use Authorization (EUA). This EUA will remain in effect (meaning this test can be used) for the duration of the COVID-19 declaration under Section 564(b)(1) of the Act, 21 U.S.C. section 360bbb-3(b)(1), unless the authorization is terminated or revoked.  Performed at [facility], [place] [address] [place], [place] [address]   Varicella-zoster by PCR (Blood or Swab)     Status: None   Collection Time: 12/03/20  3:00 AM   Specimen: Sterile Swab  Result Value Ref Range Status   Varicella-Zoster, PCR Negative Negative Final    Comment: (NOTE) No Varicella Zoster Virus DNA detected. This test was developed and its performance characteristics determined by [facility].  It has not been cleared or approved by the Food and Drug Administration.  The FDA has determined that such clearance or approval is not necessary. Performed At: [facility] [place], [place] [id number] Rush Farmer MD [id number]   Aerobic/Anaerobic Culture w Gram Stain (surgical/deep wound)     Status: None (Preliminary result)   Collection Time: 12/03/20  2:34 PM   Specimen: Back  Result Value Ref Range Status   Specimen Description   Final    BACK Performed at [facility] [address], [place], [place] [address]    Special Requests   Final    Normal Performed at [facility], [place] [address] [place], [place] [address]    Gram Stain   Final    RARE SQUAMOUS EPITHELIAL CELLS PRESENT RARE WBC PRESENT,BOTH PMN AND MONONUCLEAR RARE GRAM POSITIVE COCCI Performed at [facility], [place] [address] [place], [place] [address]    Culture   Final    FEW METHICILLIN RESISTANT STAPHYLOCOCCUS AUREUS NO ANAEROBES ISOLATED; CULTURE IN PROGRESS FOR 5 DAYS    Report Status PENDING  Incomplete   Organism ID, Bacteria METHICILLIN RESISTANT STAPHYLOCOCCUS AUREUS  Final      Susceptibility   Methicillin resistant staphylococcus aureus - MIC*    CIPROFLOXACIN >=8 RESISTANT Resistant     ERYTHROMYCIN >=8 RESISTANT Resistant     GENTAMICIN <=0.5 SENSITIVE Sensitive     OXACILLIN >=4 RESISTANT Resistant     TETRACYCLINE <=1 SENSITIVE Sensitive     VANCOMYCIN <=0.5 SENSITIVE Sensitive     TRIMETH/SULFA <=10 SENSITIVE Sensitive     CLINDAMYCIN <=0.25 SENSITIVE Sensitive     RIFAMPIN <=0.5 SENSITIVE Sensitive     Inducible Clindamycin NEGATIVE Sensitive     * FEW METHICILLIN RESISTANT STAPHYLOCOCCUS AUREUS     Labs: BNP (last 3 results) No results for input(s): BNP in the last 8760 hours. Basic Metabolic Panel: Recent Labs  Lab 12/03/20 0413 12/04/20 1021 12/05/20 0323 12/07/20 0325 12/08/20 0753  NA 135 136 136 135 134*  K 5.3* 4.1 4.0 4.2 4.2  CL 104 103 104 101 102  CO2 20* 23 24 26 24   GLUCOSE 102* 188* 143* 144* 224*  BUN 38* 28* 25* 19 20  CREATININE 1.64* 1.23 1.20 1.13 1.03  CALCIUM 8.9 8.7* 8.7* 9.2 8.9  MG  --   --   --   --  1.9   Liver Function Tests: Recent Labs  Lab 12/03/20 0413  AST 37  ALT 27  ALKPHOS 126  BILITOT 0.3  PROT 7.3  ALBUMIN 2.9*   No results for input(s): LIPASE, AMYLASE in the last 168 hours. No results for input(s): AMMONIA in the last 168  hours. CBC: Recent Labs  Lab  12/02/20 0329 12/03/20 0413 12/04/20 1018 12/07/20 0325 12/08/20 0753  WBC 17.8* 15.1* 13.8* 10.0 9.9  HGB 10.7* 10.7* 9.3* 10.3* 11.0*  HCT 32.9* 32.6* 29.1* 32.2* 33.9*  MCV 85.7 85.6 86.9 84.5 85.4  PLT 216 196 218 278 290   Cardiac Enzymes: No results for input(s): CKTOTAL, CKMB, CKMBINDEX, TROPONINI in the last 168 hours. BNP: Invalid input(s): POCBNP CBG: Recent Labs  Lab 12/07/20 0753 12/07/20 1154 12/07/20 1620 12/07/20 2151 12/08/20 0819  GLUCAP 117* 181* 195* 233* 207*   D-Dimer No results for input(s): DDIMER in the last 72 hours. Hgb A1c No results for input(s): HGBA1C in the last 72 hours. Lipid Profile No results for input(s): CHOL, HDL, LDLCALC, TRIG, CHOLHDL, LDLDIRECT in the last 72 hours. Thyroid function studies No results for input(s): TSH, T4TOTAL, T3FREE, THYROIDAB in the last 72 hours.  Invalid input(s): FREET3 Anemia work up No results for input(s): VITAMINB12, FOLATE, FERRITIN, TIBC, IRON, RETICCTPCT in the last 72 hours. Urinalysis    Component Value Date/Time   COLORURINE YELLOW 10/14/2020 1436   APPEARANCEUR CLEAR 10/14/2020 1436   LABSPEC 1.010 10/14/2020 1436   PHURINE 6.0 10/14/2020 1436   GLUCOSEU >=500 (A) 10/14/2020 1436   HGBUR NEGATIVE 10/14/2020 1436   BILIRUBINUR NEGATIVE 10/14/2020 1436   KETONESUR 20 (A) 10/14/2020 1436   PROTEINUR 30 (A) 10/14/2020 1436   NITRITE NEGATIVE 10/14/2020 1436   LEUKOCYTESUR NEGATIVE 10/14/2020 1436   Sepsis Labs Invalid input(s): PROCALCITONIN,  WBC,  LACTICIDVEN Microbiology Recent Results (from the past 240 hour(s))  Culture, blood (routine x 2)     Status: None   Collection Time: 11/30/20  4:28 PM   Specimen: BLOOD LEFT FOREARM  Result Value Ref Range Status   Specimen Description   Final    BLOOD LEFT FOREARM Performed at [facility], [place] [address], [place], [place] [address]    Special Requests   Final    BOTTLES DRAWN AEROBIC AND ANAEROBIC Blood Culture  results may not be optimal due to an inadequate volume of blood received in culture bottles Performed at [facility] [address], [place], [place] [address]    Culture   Final    NO GROWTH 5 DAYS Performed at [facility], [place] [address], [place], [place] [address]    Report Status 12/05/2020 FINAL  Final  Culture, blood (routine x 2)     Status: None   Collection Time: 11/30/20  4:33 PM   Specimen: BLOOD  Result Value Ref Range Status   Specimen Description   Final    BLOOD LEFT ANTECUBITAL Performed at [facility] [address] [place], [place] [address]    Special Requests   Final    BOTTLES DRAWN AEROBIC AND ANAEROBIC Blood Culture results may not be optimal due to an inadequate volume of blood received in culture bottles Performed at [facility] [address] [place], [place] [address]    Culture   Final    NO GROWTH 5 DAYS Performed at [facility], [place] [address], [place], [place] [address]    Report Status 12/05/2020 FINAL  Final  Resp Panel by RT-PCR (Flu A&B, Covid) Nasopharyngeal Swab     Status: None   Collection Time: 11/30/20  6:23 PM   Specimen: Nasopharyngeal Swab; Nasopharyngeal(NP) swabs in vial transport medium  Result Value Ref Range Status   SARS Coronavirus 2 by RT PCR  NEGATIVE NEGATIVE Final    Comment: (NOTE) SARS-CoV-2 target nucleic acids are NOT DETECTED.  The SARS-CoV-2 RNA is generally detectable in upper respiratory specimens during the acute phase of infection. The lowest concentration of SARS-CoV-2 viral copies this assay can detect is 138 copies/mL. A negative result does not preclude SARS-Cov-2 infection and should not be used as the sole basis for treatment or other patient management decisions. A negative result may occur with  improper specimen collection/handling, submission of specimen other than nasopharyngeal swab, presence of viral mutation(s) within  the areas targeted by this assay, and inadequate number of viral copies(<138 copies/mL). A negative result must be combined with clinical observations, patient history, and epidemiological information. The expected result is Negative.  Fact Sheet for Patients:  EntrepreneurPulse.com.au  Fact Sheet for Healthcare Providers:  IncredibleEmployment.be  This test is no t yet approved or cleared by the [place] FDA and  has been authorized for detection and/or diagnosis of SARS-CoV-2 by FDA under an Emergency Use Authorization (EUA). This EUA will remain  in effect (meaning this test can be used) for the duration of the COVID-19 declaration under Section 564(b)(1) of the Act, 21 U.S.C.section 360bbb-3(b)(1), unless the authorization is terminated  or revoked sooner.       Influenza A by PCR NEGATIVE NEGATIVE Final   Influenza B by PCR NEGATIVE NEGATIVE Final    Comment: (NOTE) The Xpert Xpress SARS-CoV-2/FLU/RSV plus assay is intended as an aid in the diagnosis of influenza from Nasopharyngeal swab specimens and should not be used as a sole basis for treatment. Nasal washings and aspirates are unacceptable for Xpert Xpress SARS-CoV-2/FLU/RSV testing.  Fact Sheet for Patients: EntrepreneurPulse.com.au  Fact Sheet for Healthcare Providers: IncredibleEmployment.be  This test is not yet approved or cleared by the [place] FDA and has been authorized for detection and/or diagnosis of SARS-CoV-2 by FDA under an Emergency Use Authorization (EUA). This EUA will remain in effect (meaning this test can be used) for the duration of the COVID-19 declaration under Section 564(b)(1) of the Act, 21 U.S.C. section 360bbb-3(b)(1), unless the authorization is terminated or revoked.  Performed at [facility] [place] [address] [place], [place] [address]   Varicella-zoster by PCR (Blood or Swab)      Status: None   Collection Time: 12/03/20  3:00 AM   Specimen: Sterile Swab  Result Value Ref Range Status   Varicella-Zoster, PCR Negative Negative Final    Comment: (NOTE) No Varicella Zoster Virus DNA detected. This test was developed and its performance characteristics determined by [facility].  It has not been cleared or approved by the Food and Drug Administration.  The FDA has determined that such clearance or approval is not necessary. Performed At: [facility] [place], [place] [id number] Rush Farmer MD [id number]   Aerobic/Anaerobic Culture w Gram Stain (surgical/deep wound)     Status: None (Preliminary result)   Collection Time: 12/03/20  2:34 PM   Specimen: Back  Result Value Ref Range Status   Specimen Description   Final    BACK Performed at [place] [address] [place],  [address]    Special Requests   Final    Normal Performed at [facility], [place] [address], [place], [place] [address]    Gram Stain   Final    RARE SQUAMOUS EPITHELIAL CELLS PRESENT RARE WBC PRESENT,BOTH PMN AND MONONUCLEAR RARE GRAM POSITIVE COCCI Performed at [facility], [place]  [address], [place], [place] [address]    Culture   Final    FEW METHICILLIN RESISTANT STAPHYLOCOCCUS AUREUS NO ANAEROBES ISOLATED; CULTURE IN PROGRESS FOR 5 DAYS    Report Status PENDING  Incomplete   Organism ID, Bacteria METHICILLIN RESISTANT STAPHYLOCOCCUS AUREUS  Final      Susceptibility   Methicillin resistant staphylococcus aureus - MIC*    CIPROFLOXACIN >=8 RESISTANT Resistant     ERYTHROMYCIN >=8 RESISTANT Resistant     GENTAMICIN <=0.5 SENSITIVE Sensitive     OXACILLIN >=4 RESISTANT Resistant     TETRACYCLINE <=1 SENSITIVE Sensitive     VANCOMYCIN <=0.5 SENSITIVE Sensitive     TRIMETH/SULFA <=10 SENSITIVE Sensitive     CLINDAMYCIN <=0.25 SENSITIVE Sensitive     RIFAMPIN <=0.5 SENSITIVE Sensitive     Inducible  Clindamycin NEGATIVE Sensitive     * FEW METHICILLIN RESISTANT STAPHYLOCOCCUS AUREUS     Time coordinating discharge:  I have spent 35 minutes face to face with the patient and on the ward discussing the patients care, assessment, plan and disposition with other care givers. >50% of the time was devoted counseling the patient about the risks and benefits of treatment/Discharge disposition and coordinating care.   SIGNED:   Damita Lack, MD  [facility] 12/08/2020, 9:28 AM   If 7PM-7AM, please contact night-coverage

## 2020-12-08 NOTE — Progress Notes (Signed)
NUTRITION NOTE  Consult received for patient with questions about recommended snacks in line with controlling CBGs.   Patient sitting up in the chair. He is being discharged home today.   Discussed carbohydrates and their effect on CBGs. Recommended snacks be 15-30 grams of carbohydrate and provided recommendations for items to have at snacks and recommended consuming protein with each snack.  Discussed referencing the American Diabetes Association web site for recipe ideas and any questions that arise once he returns home.    No further nutrition-related needs at this time.     Trenton Gammon, MS, RD, LDN, CNSC Inpatient Clinical Dietitian RD pager # available in [facility]  After hours/weekend pager # available in [facility]

## 2020-12-08 NOTE — TOC Transition Note (Signed)
Transition of Care [facility]) - CM/SW Discharge Note  Patient Details  Name: Mark [name] MRN: [id number] Date of Birth: [birth year]  Transition of Care [facility]) CM/SW Contact:  Ewing Schlein, LCSW Phone Number: 12/08/2020, 9:49 AM  Clinical Narrative: Patient was active with [facility] with [facility] prior to admission. New HH orders have been placed. Patient aware of discharge. TOC signing off.  Final next level of care: Home w Home Health Services Barriers to Discharge: Barriers Resolved  Patient Goals and CMS Choice Patient states their goals for this hospitalization and ongoing recovery are:: Return home with [facility]  Discharge Plan and Services         DME Arranged: N/A DME Agency: NA HH Arranged: RN HH Agency: [facility] Representative spoke with at [facility] Agency: Already active prior to admission  Readmission Risk Interventions No flowsheet data found.

## 2020-12-08 NOTE — Plan of Care (Signed)
?  Problem: Activity: ?Goal: Risk for activity intolerance will decrease ?Outcome: Progressing ?  ?Problem: Safety: ?Goal: Ability to remain free from injury will improve ?Outcome: Progressing ?  ?Problem: Pain Managment: ?Goal: General experience of comfort will improve ?Outcome: Progressing ?  ?

## 2020-12-08 NOTE — Progress Notes (Signed)
Virtual Visit via Video Note  I connected with Mark [name] on 12/08/20 at 2:00pm EST by video-enabled telemedicine with the correct person using two identifiers.   I discussed the limitations of evaluation and management by telemedicine and the availability of in person appointments. The patient expressed understanding and agreed to proceed.  LOCATION: Patient: hospital Provider: home office    History of Present Illness: Pt was referred to [facility] OP therapy for PTSD, depression and anxiety by the [facility].      Observations/Objective: Patient presented for today's session on time and was alert, oriented x5, with no evidence or self-report of SI/HI or A/V H.  Patient reported ongoing compliance with medication. Clinician inquired about patient's current emotional ratings, as well as any significant changes in thoughts, feelings or behavior since previous session.  Patient reported scores of 7/10 for depression, 9/10 for anxiety, 8/10 for anger/irritability. Cln and pt explored his emotional ratings and coping skills. Pt is currently in the hospital for more sepsis sites on his back, for the past 8 days. Pt reports his moods have been lower due to being in the hospital, with pain. Clinician engaged Pt in a discussion on identified positives and utilized MI, OARS to assess how these have impacted client's mood. Clinician allowed space for further processing of emotions and provided supportive statements throughout session. Pt will be released from the hospital today.       Assessment and Plan: Counselor will continue to meet with patient to address treatment plan goals. Patient will continue to follow recommendations of providers and implement skills learned in session.   Follow Up Instructions I discussed the assessment and treatment plan with the patient. The patient was provided an opportunity to ask questions and all were answered. The patient agreed with the plan and demonstrated an  understanding of the instructions.   The patient was advised to call back or seek an in-person evaluation if the symptoms worsen or if the condition fails to improve as anticipated.  I provided 45 minutes of non-face-to-face time during this encounter.   Cass Edinger S, LCAS  .

## 2020-12-08 NOTE — Progress Notes (Signed)
Discharge package printed and instructions given to pt. Patient verbalizes understanding. 

## 2020-12-08 NOTE — Progress Notes (Signed)
Dietitian was paged for  pt's consultation. Await on return call.

## 2020-12-12 ENCOUNTER — Other Ambulatory Visit: Payer: Self-pay

## 2020-12-12 ENCOUNTER — Ambulatory Visit (INDEPENDENT_AMBULATORY_CARE_PROVIDER_SITE_OTHER): Payer: No Typology Code available for payment source | Admitting: Licensed Clinical Social Worker

## 2020-12-12 ENCOUNTER — Encounter (HOSPITAL_COMMUNITY): Payer: Self-pay | Admitting: Licensed Clinical Social Worker

## 2020-12-12 DIAGNOSIS — F419 Anxiety disorder, unspecified: Secondary | ICD-10-CM

## 2020-12-12 DIAGNOSIS — F4310 Post-traumatic stress disorder, unspecified: Secondary | ICD-10-CM | POA: Diagnosis not present

## 2020-12-12 DIAGNOSIS — F331 Major depressive disorder, recurrent, moderate: Secondary | ICD-10-CM | POA: Diagnosis not present

## 2020-12-12 NOTE — Progress Notes (Signed)
Virtual Visit via Video Note  I connected with [name] on 12/12/20 at 5:00 pm EST by a video enabled telemedicine application and verified that I am speaking with the correct person using two identifiers.   I discussed the limitations of evaluation and management by telemedicine and the availability of in person appointments. The patient expressed understanding and agreed to proceed.  LOCATION: Patient: Home Provider: home Office  History of Present Illness: [facility]   Observation/Objective:   Patient participated in a group discussion on "tolerating the holidays" with family members. Patient described his holiday plans and coping skills to deal with feelings surrounding the holidays. Patient was encouraged to express his feelings in positive ways.                         Assessment and Plan: Counselor will continue to meet with patient address treatment plan goals. Patient recommendations of providers and implement skills learned in session.     Follow-up instructions: I discussed the assessment and treatment plan with the patient. The patient was provided an opportunity to ask questions and all were answered. The patient agreed with the plan and demonstrated an understanding of the instructions.   The patient was advised to call back or seek an in-person evaluation if the symptoms worsen or if the condition fails to improve as anticipated.  I provided 60 minutes of non-face-to-face time during this encounter.   Antwane Grose S, LCAS

## 2020-12-19 ENCOUNTER — Encounter (HOSPITAL_COMMUNITY): Payer: Self-pay | Admitting: Licensed Clinical Social Worker

## 2020-12-19 ENCOUNTER — Ambulatory Visit (INDEPENDENT_AMBULATORY_CARE_PROVIDER_SITE_OTHER): Payer: No Typology Code available for payment source | Admitting: Licensed Clinical Social Worker

## 2020-12-19 ENCOUNTER — Other Ambulatory Visit: Payer: Self-pay

## 2020-12-19 DIAGNOSIS — F331 Major depressive disorder, recurrent, moderate: Secondary | ICD-10-CM | POA: Diagnosis not present

## 2020-12-19 DIAGNOSIS — F4310 Post-traumatic stress disorder, unspecified: Secondary | ICD-10-CM

## 2020-12-19 DIAGNOSIS — F419 Anxiety disorder, unspecified: Secondary | ICD-10-CM

## 2020-12-19 NOTE — Progress Notes (Signed)
Virtual Visit via Video Note  I connected with Mark [name] on 12/19/20 at 5:00 pm EST by a video enabled telemedicine application and verified that I am speaking with the correct person using two identifiers.   I discussed the limitations of evaluation and management by telemedicine and the availability of in person appointments. The patient expressed understanding and agreed to proceed.  LOCATION: Patient: Home Provider: home Office  History of Present Illness: [facility], [facility], [facility].   Observation/Objective:   Pt participated in a discussion on family dysfunction. Clinician assessed patient's family dynamics and discussed how the dynamics influence mental health symptoms in a negative way. Patient was encouraged to work to improve family function.                           Assessment and Plan: Counselor will continue to meet with patient address treatment plan goals. Patient recommendations of providers and implement skills learned in session.     Follow-up instructions: I discussed the assessment and treatment plan with the patient. The patient was provided an opportunity to ask questions and all were answered. The patient agreed with the plan and demonstrated an understanding of the instructions.   The patient was advised to call back or seek an in-person evaluation if the symptoms worsen or if the condition fails to improve as anticipated.  I provided 60 minutes of non-face-to-face time during this encounter.   Suhana Wilner S, LCAS

## 2020-12-22 ENCOUNTER — Encounter (HOSPITAL_COMMUNITY): Payer: Self-pay | Admitting: Licensed Clinical Social Worker

## 2020-12-22 ENCOUNTER — Other Ambulatory Visit: Payer: Self-pay

## 2020-12-22 ENCOUNTER — Ambulatory Visit (INDEPENDENT_AMBULATORY_CARE_PROVIDER_SITE_OTHER): Payer: No Typology Code available for payment source | Admitting: Licensed Clinical Social Worker

## 2020-12-22 DIAGNOSIS — F4310 Post-traumatic stress disorder, unspecified: Secondary | ICD-10-CM | POA: Diagnosis not present

## 2020-12-22 DIAGNOSIS — F331 Major depressive disorder, recurrent, moderate: Secondary | ICD-10-CM

## 2020-12-22 DIAGNOSIS — F419 Anxiety disorder, unspecified: Secondary | ICD-10-CM | POA: Diagnosis not present

## 2020-12-22 NOTE — Progress Notes (Signed)
Virtual Visit via Video Note  I connected with Mark [name] on 12/08/20 at 2:00pm EST by video-enabled telemedicine with the correct person using two identifiers.   I discussed the limitations of evaluation and management by telemedicine and the availability of in person appointments. The patient expressed understanding and agreed to proceed.  LOCATION: Patient: home Provider: home office    History of Present Illness: Pt was referred to [facility] OP therapy for PTSD, depression and anxiety by the [facility].      Observations/Objective: Patient presented for today's session on time and was alert, oriented x5, with no evidence or self-report of SI/HI or A/V H.  Patient reported ongoing compliance with medication. Clinician inquired about patient's current emotional ratings, as well as any significant changes in thoughts, feelings or behavior since previous session.  Patient reported scores of 7/10 for depression, 9/10 for anxiety, 8/10 for anger/irritability. Cln and pt explored his emotional ratings and coping skills. "I've been crying all weekend thinking of all the regrets of my life. I've been watching the TV show [employer], which I think triggered my emotions." Cln provided education on Mindfulness-based skills to help him alleviate stress by improving his emotion regulation, leading to a better mood and better ability to handle stress    Assessment and Plan: Counselor will continue to meet with patient to address treatment plan goals. Patient will continue to follow recommendations of providers and implement skills learned in session.   Follow Up Instructions I discussed the assessment and treatment plan with the patient. The patient was provided an opportunity to ask questions and all were answered. The patient agreed with the plan and demonstrated an understanding of the instructions.   The patient was advised to call back or seek an in-person evaluation if the symptoms worsen or if  the condition fails to improve as anticipated.  I provided 45 minutes of non-face-to-face time during this encounter.   Meghan Warshawsky S, LCAS  .

## 2020-12-26 ENCOUNTER — Ambulatory Visit (INDEPENDENT_AMBULATORY_CARE_PROVIDER_SITE_OTHER): Payer: No Typology Code available for payment source | Admitting: Licensed Clinical Social Worker

## 2020-12-26 ENCOUNTER — Encounter (HOSPITAL_COMMUNITY): Payer: Self-pay | Admitting: Licensed Clinical Social Worker

## 2020-12-26 ENCOUNTER — Other Ambulatory Visit: Payer: Self-pay

## 2020-12-26 DIAGNOSIS — F331 Major depressive disorder, recurrent, moderate: Secondary | ICD-10-CM | POA: Diagnosis not present

## 2020-12-26 DIAGNOSIS — F4310 Post-traumatic stress disorder, unspecified: Secondary | ICD-10-CM

## 2020-12-26 DIAGNOSIS — F419 Anxiety disorder, unspecified: Secondary | ICD-10-CM

## 2020-12-26 NOTE — Progress Notes (Signed)
Virtual Visit via Video Note  I connected with Mark [name] on 12/26/20 at 5:00 pm EST by a video enabled telemedicine application and verified that I am speaking with the correct person using two identifiers.   I discussed the limitations of evaluation and management by telemedicine and the availability of in person appointments. The patient expressed understanding and agreed to proceed.  LOCATION: Patient: Home Provider: home Office  History of Present Illness: [facility], [facility]   Observation/Objective:   Pt participated in a discussion on chronic pain using CBT, which focuses on reducing pain and distress by modifying physical sensations, catastrophic thinking, and maladaptive behaviors. Pt was encouraged to use CBT to focus on reducing his chronic pain.                          Assessment and Plan: Counselor will continue to meet with patient address treatment plan goals. Patient recommendations of providers and implement skills learned in session.     Follow-up instructions: I discussed the assessment and treatment plan with the patient. The patient was provided an opportunity to ask questions and all were answered. The patient agreed with the plan and demonstrated an understanding of the instructions.   The patient was advised to call back or seek an in-person evaluation if the symptoms worsen or if the condition fails to improve as anticipated.  I provided 60 minutes of non-face-to-face time during this encounter.   Charbel Los S, LCAS

## 2020-12-29 ENCOUNTER — Encounter (HOSPITAL_COMMUNITY): Payer: Self-pay | Admitting: Licensed Clinical Social Worker

## 2020-12-29 ENCOUNTER — Other Ambulatory Visit: Payer: Self-pay

## 2020-12-29 ENCOUNTER — Ambulatory Visit (INDEPENDENT_AMBULATORY_CARE_PROVIDER_SITE_OTHER): Payer: No Typology Code available for payment source | Admitting: Licensed Clinical Social Worker

## 2020-12-29 DIAGNOSIS — F4310 Post-traumatic stress disorder, unspecified: Secondary | ICD-10-CM

## 2020-12-29 DIAGNOSIS — F419 Anxiety disorder, unspecified: Secondary | ICD-10-CM | POA: Diagnosis not present

## 2020-12-29 DIAGNOSIS — F331 Major depressive disorder, recurrent, moderate: Secondary | ICD-10-CM

## 2020-12-29 NOTE — Progress Notes (Signed)
Virtual Visit via Video Note  I connected with Mark [name] on 12/29/20 at 2:00pm EST by video-enabled telemedicine with the correct person using two identifiers.   I discussed the limitations of evaluation and management by telemedicine and the availability of in person appointments. The patient expressed understanding and agreed to proceed.  LOCATION: Patient: home Provider: home office    History of Present Illness: Pt was referred to [facility] OP therapy for PTSD, depression and anxiety by the [facility].      Observations/Objective: Patient presented for today's session on time and was alert, oriented x5, with no evidence or self-report of SI/HI or A/V H.  Patient reported ongoing compliance with medication. Clinician inquired about patient's current emotional ratings, as well as any significant changes in thoughts, feelings or behavior since previous session.  Patient reported emotional rating scores of 9/10 for depression, 9/10 for anxiety, 8/10 for anger/irritability. Cln and pt explored his emotional ratings and coping skills. Pt reports "my depressive symptoms have increased." CLn and pt explored triggers for increase in depressive symptoms. Cln used behavioral activation helping increase the amount of positive reinforcement pt experiences, and help end negative behavior patterns that cause his depression to worsen. Cln provided education to show pt how to replace his negative avoidant behaviors with new rewarding behaviors.   Assessment and Plan: Counselor will continue to meet with patient to address treatment plan goals. Patient will continue to follow recommendations of providers and implement skills learned in session.   Follow Up Instructions I discussed the assessment and treatment plan with the patient. The patient was provided an opportunity to ask questions and all were answered. The patient agreed with the plan and demonstrated an understanding of the instructions.   The  patient was advised to call back or seek an in-person evaluation if the symptoms worsen or if the condition fails to improve as anticipated.  I provided 60 minutes of non-face-to-face time during this encounter.   Zachariah Pavek S, LCAS  .

## 2021-01-02 ENCOUNTER — Encounter (HOSPITAL_COMMUNITY): Payer: Self-pay | Admitting: Licensed Clinical Social Worker

## 2021-01-02 ENCOUNTER — Other Ambulatory Visit: Payer: Self-pay

## 2021-01-02 ENCOUNTER — Ambulatory Visit (INDEPENDENT_AMBULATORY_CARE_PROVIDER_SITE_OTHER): Payer: No Typology Code available for payment source | Admitting: Licensed Clinical Social Worker

## 2021-01-02 DIAGNOSIS — F4310 Post-traumatic stress disorder, unspecified: Secondary | ICD-10-CM | POA: Diagnosis not present

## 2021-01-02 DIAGNOSIS — F419 Anxiety disorder, unspecified: Secondary | ICD-10-CM

## 2021-01-02 DIAGNOSIS — F331 Major depressive disorder, recurrent, moderate: Secondary | ICD-10-CM | POA: Diagnosis not present

## 2021-01-02 NOTE — Progress Notes (Signed)
Virtual Visit via Video Note  I connected with Mark [name] on 01/02/21 at 5:00 pm EST by a video enabled telemedicine application and verified that I am speaking with the correct person using two identifiers.   I discussed the limitations of evaluation and management by telemedicine and the availability of in person appointments. The patient expressed understanding and agreed to proceed.  LOCATION: Patient: Home Provider: home Office  History of Present Illness: [facility], [facility], [facility].   Observation/Objective:   Patient participated in a discussion on sense of self-worth, valuing self, and having a sense of feeling worthy. Patient was encouraged to acknowledge the inner critic and practice positive affirmations.                          Assessment and Plan: Counselor will continue to meet with patient address treatment plan goals. Patient recommendations of providers and implement skills learned in session.     Follow-up instructions: I discussed the assessment and treatment plan with the patient. The patient was provided an opportunity to ask questions and all were answered. The patient agreed with the plan and demonstrated an understanding of the instructions.   The patient was advised to call back or seek an in-person evaluation if the symptoms worsen or if the condition fails to improve as anticipated.  I provided 60 minutes of non-face-to-face time during this encounter.   Tyner Codner S, LCAS

## 2021-01-05 ENCOUNTER — Other Ambulatory Visit: Payer: Self-pay

## 2021-01-05 ENCOUNTER — Encounter (HOSPITAL_COMMUNITY): Payer: Self-pay | Admitting: Licensed Clinical Social Worker

## 2021-01-05 ENCOUNTER — Ambulatory Visit (INDEPENDENT_AMBULATORY_CARE_PROVIDER_SITE_OTHER): Payer: No Typology Code available for payment source | Admitting: Licensed Clinical Social Worker

## 2021-01-05 DIAGNOSIS — F419 Anxiety disorder, unspecified: Secondary | ICD-10-CM

## 2021-01-05 DIAGNOSIS — F331 Major depressive disorder, recurrent, moderate: Secondary | ICD-10-CM | POA: Diagnosis not present

## 2021-01-05 DIAGNOSIS — F4310 Post-traumatic stress disorder, unspecified: Secondary | ICD-10-CM

## 2021-01-05 NOTE — Progress Notes (Signed)
Virtual Visit via Video Note  I connected with Mark [name] on 01/05/21 at 2:00pm EST by video-enabled telemedicine with the correct person using two identifiers.   I discussed the limitations of evaluation and management by telemedicine and the availability of in person appointments. The patient expressed understanding and agreed to proceed.  LOCATION: Patient: home Provider: home office    History of Present Illness: Pt was referred to [facility] OP therapy for PTSD, depression and anxiety by the [facility].      Observations/Objective: Patient presented for todays session on time and was alert, oriented x5, with no evidence or self-report of SI/HI or A/V H.  Patient reported ongoing compliance with medication. Clinician inquired about patients current emotional ratings, as well as any significant changes in thoughts, feelings or behavior since previous session.  Patient reported emotional rating scores of 8/10 for depression, 5/10 for anxiety, 5/10 for anger/irritability. Cln and pt explored his emotional ratings and coping skills. Pt reports "my depressive symptoms have increased." Cln used behavioral activation to listen and pinpoint negative behavior patterns, and collaborate with pt to figure out how the negative behavior patterns can be damaging. Pt talked of his holiday plans, "I can't go be with family because the drive is too hard." Cln and pt explored his feelings about the holiday, being alone. Pt also reports "[facility] and [facility] but now they have to order new parts. I have a replacement but I can't go anywhere because it doesn't fit on my car lift." Cln used CBT to assist pt with the dimensions of loneliness.   Assessment and Plan: Counselor will continue to meet with patient to address treatment plan goals. Patient will continue to follow recommendations of providers and implement skills learned in session.   Follow Up Instructions I  discussed the assessment and treatment plan with the patient. The patient was provided an opportunity to ask questions and all were answered. The patient agreed with the plan and demonstrated an understanding of the instructions.   The patient was advised to call back or seek an in-person evaluation if the symptoms worsen or if the condition fails to improve as anticipated.  I provided 60 minutes of non-face-to-face time during this encounter.   Brenson Hartman S, LCAS  .

## 2021-01-09 ENCOUNTER — Ambulatory Visit (INDEPENDENT_AMBULATORY_CARE_PROVIDER_SITE_OTHER): Payer: No Typology Code available for payment source | Admitting: Licensed Clinical Social Worker

## 2021-01-09 ENCOUNTER — Encounter (HOSPITAL_COMMUNITY): Payer: Self-pay | Admitting: Licensed Clinical Social Worker

## 2021-01-09 DIAGNOSIS — F4310 Post-traumatic stress disorder, unspecified: Secondary | ICD-10-CM | POA: Diagnosis not present

## 2021-01-09 DIAGNOSIS — F419 Anxiety disorder, unspecified: Secondary | ICD-10-CM | POA: Diagnosis not present

## 2021-01-09 DIAGNOSIS — F331 Major depressive disorder, recurrent, moderate: Secondary | ICD-10-CM

## 2021-01-09 NOTE — Progress Notes (Signed)
Virtual Visit via Video Note  I connected with [name] on 01/09/21 at 5:00 pm EST by a video enabled telemedicine application and verified that I am speaking with the correct person using two identifiers.   I discussed the limitations of evaluation and management by telemedicine and the availability of in person appointments. The patient expressed understanding and agreed to proceed.  LOCATION: Patient: Home Provider: home Office  History of Present Illness: [facility], [facility], [facility].   Observation/Objective:   Patient participated in a group discussion on tolerating the holidays with family members. Patient described his holiday plans and coping skills to deal with feelings surrounding the holidays. Patient was encouraged to express his feelings in positive ways.                           Assessment and Plan: Counselor will continue to meet with patient address treatment plan goals. Patient recommendations of providers and implement skills learned in session.     Follow-up instructions: I discussed the assessment and treatment plan with the patient. The patient was provided an opportunity to ask questions and all were answered. The patient agreed with the plan and demonstrated an understanding of the instructions.   The patient was advised to call back or seek an in-person evaluation if the symptoms worsen or if the condition fails to improve as anticipated.  I provided 75 minutes of non-face-to-face time during this encounter.   Izmael Duross S, LCAS

## 2021-01-12 ENCOUNTER — Encounter (HOSPITAL_COMMUNITY): Payer: Self-pay | Admitting: Licensed Clinical Social Worker

## 2021-01-12 ENCOUNTER — Ambulatory Visit (INDEPENDENT_AMBULATORY_CARE_PROVIDER_SITE_OTHER): Payer: No Typology Code available for payment source | Admitting: Licensed Clinical Social Worker

## 2021-01-12 ENCOUNTER — Other Ambulatory Visit: Payer: Self-pay

## 2021-01-12 DIAGNOSIS — F331 Major depressive disorder, recurrent, moderate: Secondary | ICD-10-CM

## 2021-01-12 DIAGNOSIS — F4310 Post-traumatic stress disorder, unspecified: Secondary | ICD-10-CM

## 2021-01-12 DIAGNOSIS — F419 Anxiety disorder, unspecified: Secondary | ICD-10-CM

## 2021-01-12 NOTE — Progress Notes (Signed)
Virtual Visit via Video Note  I connected with Mark [name] on 01/12/21 at 2:00pm EST by video-enabled telemedicine with the correct person using two identifiers.   I discussed the limitations of evaluation and management by telemedicine and the availability of in person appointments. The patient expressed understanding and agreed to proceed.  LOCATION: Patient: home Provider: home office    History of Present Illness: Pt was referred to [facility] OP therapy for PTSD, depression and anxiety by the [facility].      Observations/Objective: Patient presented for todays session on time and was alert, oriented x5, with no evidence or self-report of SI/HI or A/V H.  Patient reported ongoing compliance with medication. Clinician inquired about patients current emotional ratings, as well as any significant changes in thoughts, feelings or behavior since previous session.  Patient reported emotional rating scores of 8/10 for depression, 8/10 for anxiety, 6/10 for anger/irritability. Cln and pt explored his emotional ratings and coping skills. Pt reports "my depressive symptoms continue to be elevated." Again, Cln used behavioral activation to assist patient in identifying his need for positive reinforcement in his life. Pt talked of his holiday plans, "I can't go be with family because the drive is too hard and my chair is broken." Cln and pt explored his feelings about the holiday, being alone. Cln used CBT to assist pt with the dimensions of loneliness during the holiday season.   Assessment and Plan: Counselor will continue to meet with patient to address treatment plan goals. Patient will continue to follow recommendations of providers and implement skills learned in session.   Follow Up Instructions I discussed the assessment and treatment plan with the patient. The patient was provided an opportunity to ask questions and all were answered. The patient agreed with the plan and demonstrated an  understanding of the instructions.   The patient was advised to call back or seek an in-person evaluation if the symptoms worsen or if the condition fails to improve as anticipated.  I provided 45 minutes of non-face-to-face time during this encounter.   Jerolene Kupfer S, LCAS  .

## 2021-01-18 ENCOUNTER — Encounter (HOSPITAL_COMMUNITY): Payer: Self-pay | Admitting: Licensed Clinical Social Worker

## 2021-01-18 ENCOUNTER — Ambulatory Visit (INDEPENDENT_AMBULATORY_CARE_PROVIDER_SITE_OTHER): Payer: No Typology Code available for payment source | Admitting: Licensed Clinical Social Worker

## 2021-01-18 ENCOUNTER — Other Ambulatory Visit: Payer: Self-pay

## 2021-01-18 DIAGNOSIS — F4310 Post-traumatic stress disorder, unspecified: Secondary | ICD-10-CM

## 2021-01-18 DIAGNOSIS — F419 Anxiety disorder, unspecified: Secondary | ICD-10-CM

## 2021-01-18 DIAGNOSIS — F331 Major depressive disorder, recurrent, moderate: Secondary | ICD-10-CM

## 2021-01-18 NOTE — Progress Notes (Signed)
Virtual Visit via Video Note  I connected with Mark [name] on 01/09/21 at 5:00 pm EST by a video enabled telemedicine application and verified that I am speaking with the correct person using two identifiers.   I discussed the limitations of evaluation and management by telemedicine and the availability of in person appointments. The patient expressed understanding and agreed to proceed.  LOCATION: Patient: Home Provider: home Office  History of Present Illness: [facility], depression, anxiety.   Observation/Objective:   Patient participated in a group discussion on the holidays with family. Patient described his holidays and coping skills used to deal with feelings surrounding the holidays. Clinician congratulated patient on coping skills used and tolerating the holidays with family.                         Assessment and Plan: Counselor will continue to meet with patient address treatment plan goals. Patient recommendations of providers and implement skills learned in session.     Follow-up instructions: I discussed the assessment and treatment plan with the patient. The patient was provided an opportunity to ask questions and all were answered. The patient agreed with the plan and demonstrated an understanding of the instructions.   The patient was advised to call back or seek an in-person evaluation if the symptoms worsen or if the condition fails to improve as anticipated.  I provided 90 minutes of non-face-to-face time during this encounter.   Mark [name] S, LCAS

## 2021-01-19 ENCOUNTER — Ambulatory Visit (INDEPENDENT_AMBULATORY_CARE_PROVIDER_SITE_OTHER): Payer: No Typology Code available for payment source | Admitting: Licensed Clinical Social Worker

## 2021-01-19 ENCOUNTER — Encounter (HOSPITAL_COMMUNITY): Payer: Self-pay | Admitting: Licensed Clinical Social Worker

## 2021-01-19 ENCOUNTER — Other Ambulatory Visit: Payer: Self-pay

## 2021-01-19 DIAGNOSIS — F331 Major depressive disorder, recurrent, moderate: Secondary | ICD-10-CM | POA: Diagnosis not present

## 2021-01-19 DIAGNOSIS — F4310 Post-traumatic stress disorder, unspecified: Secondary | ICD-10-CM | POA: Diagnosis not present

## 2021-01-19 DIAGNOSIS — F419 Anxiety disorder, unspecified: Secondary | ICD-10-CM

## 2021-01-19 NOTE — Progress Notes (Signed)
Virtual Visit via Video Note  I connected with Mark [name] on 01/19/21 at 2:00pm EST by video-enabled telemedicine with the correct person using two identifiers.   I discussed the limitations of evaluation and management by telemedicine and the availability of in person appointments. The patient expressed understanding and agreed to proceed.  LOCATION: Patient: home Provider: home office    History of Present Illness: Pt was referred to [facility] OP therapy for PTSD, depression and anxiety by the [facility].      Observations/Objective: Patient presented for todays session on time and was alert, oriented x5, with no evidence or self-report of SI/HI or A/V H.  Patient reported ongoing compliance with medication. Clinician inquired about patients current emotional ratings, as well as any significant changes in thoughts, feelings or behavior since previous session.  Patient reported emotional rating scores of 8/10 for depression, 8/10 for anxiety, 6/10 for anger/irritability. Cln and pt explored his emotional ratings and coping skills. Pt reports "my depressive symptoms continue to be elevated, due to being alone for Christmas." Again, Cln used CBT to assist pt with the dimensions of loneliness during the holiday season, without family.   Assessment and Plan: Counselor will continue to meet with patient to address treatment plan goals. Patient will continue to follow recommendations of providers and implement skills learned in session.   Follow Up Instructions I discussed the assessment and treatment plan with the patient. The patient was provided an opportunity to ask questions and all were answered. The patient agreed with the plan and demonstrated an understanding of the instructions.   The patient was advised to call back or seek an in-person evaluation if the symptoms worsen or if the condition fails to improve as anticipated.  I provided 45 minutes of non-face-to-face time during this  encounter.   Mark [name], LCAS  .

## 2021-01-26 ENCOUNTER — Ambulatory Visit (INDEPENDENT_AMBULATORY_CARE_PROVIDER_SITE_OTHER): Payer: No Typology Code available for payment source | Admitting: Licensed Clinical Social Worker

## 2021-01-26 ENCOUNTER — Other Ambulatory Visit: Payer: Self-pay

## 2021-01-26 ENCOUNTER — Encounter (HOSPITAL_COMMUNITY): Payer: Self-pay | Admitting: Licensed Clinical Social Worker

## 2021-01-26 DIAGNOSIS — F4310 Post-traumatic stress disorder, unspecified: Secondary | ICD-10-CM | POA: Diagnosis not present

## 2021-01-26 DIAGNOSIS — F419 Anxiety disorder, unspecified: Secondary | ICD-10-CM

## 2021-01-26 DIAGNOSIS — F331 Major depressive disorder, recurrent, moderate: Secondary | ICD-10-CM

## 2021-01-26 NOTE — Progress Notes (Signed)
Virtual Visit via Video Note  I connected with Mark [name] on 01/26/21 at 2:00pm EST by video-enabled telemedicine with the correct person using two identifiers.   I discussed the limitations of evaluation and management by telemedicine and the availability of in person appointments. The patient expressed understanding and agreed to proceed.  LOCATION: Patient: home Provider: home office    History of Present Illness: Pt was referred to [facility] OP therapy for PTSD, depression and anxiety by the [facility].      Observations/Objective: Patient presented for todays session on time and was alert, oriented x5, with no evidence or self-report of SI/HI or A/V H.  Patient reported ongoing compliance with medication. Clinician inquired about patients current emotional ratings, as well as any significant changes in thoughts, feelings or behavior since previous session. Patient reported emotional rating scores of 6/10 for depression, 6/10 for anxiety, 4/10 for anger/irritability. Cln and pt explored his emotional ratings and coping skills. Pt discussed his new years goals: work, family, mental wellness, physical wellness. Cln provided education on SMART Goals to assist him of effectively deciding what he wants to achieve, and strategies to meet those goals, while remaining in control.    Assessment and Plan: Counselor will continue to meet with patient to address treatment plan goals. Patient will continue to follow recommendations of providers and implement skills learned in session.   Follow Up Instructions I discussed the assessment and treatment plan with the patient. The patient was provided an opportunity to ask questions and all were answered. The patient agreed with the plan and demonstrated an understanding of the instructions.   The patient was advised to call back or seek an in-person evaluation if the symptoms worsen or if the condition fails to improve as anticipated.  I provided 45  minutes of non-face-to-face time during this encounter.   Favor Hackler S, LCAS  .

## 2021-01-30 ENCOUNTER — Other Ambulatory Visit: Payer: Self-pay

## 2021-01-30 ENCOUNTER — Encounter (HOSPITAL_COMMUNITY): Payer: Self-pay | Admitting: Licensed Clinical Social Worker

## 2021-01-30 ENCOUNTER — Ambulatory Visit (INDEPENDENT_AMBULATORY_CARE_PROVIDER_SITE_OTHER): Payer: No Typology Code available for payment source | Admitting: Licensed Clinical Social Worker

## 2021-01-30 DIAGNOSIS — F331 Major depressive disorder, recurrent, moderate: Secondary | ICD-10-CM

## 2021-01-30 DIAGNOSIS — F419 Anxiety disorder, unspecified: Secondary | ICD-10-CM

## 2021-01-30 DIAGNOSIS — F4310 Post-traumatic stress disorder, unspecified: Secondary | ICD-10-CM

## 2021-01-30 NOTE — Progress Notes (Signed)
Virtual Visit via Video Note  I connected with Mark [name] on 01/09/21 at 5:00 pm EST by a video enabled telemedicine application and verified that I am speaking with the correct person using two identifiers.   I discussed the limitations of evaluation and management by telemedicine and the availability of in person appointments. The patient expressed understanding and agreed to proceed.  LOCATION: Patient: Home Provider: home Office  History of Present Illness: [facility], depression, anxiety.   Observation/Objective:    Patient participated in a group on making SMART goals for the new year of 2023. Clinician provided education on goal setting where SMART goals help focus efforts and increases the chances of achieving goals.  Patient was encouraged to identify personal goals for the new year of 2023.                        Assessment and Plan: Counselor will continue to meet with patient address treatment plan goals. Patient was given a chance for recommendations of providers and implement skills learned in session.     Follow-up instructions: I discussed the assessment and treatment plan with the patient.           Patient was provided an opportunity to ask questions and all were answered. The patient agreed with the plan and demonstrated an understanding of the instructions.   The patient was advised to call back or seek an in-person evaluation if the symptoms worsen or if the condition fails to improve as anticipated.  I provided 60 minutes of non-face-to-face time during this encounter.   Elgar Scoggins S, LCAS

## 2021-02-02 ENCOUNTER — Encounter (HOSPITAL_COMMUNITY): Payer: Self-pay | Admitting: Licensed Clinical Social Worker

## 2021-02-02 ENCOUNTER — Other Ambulatory Visit: Payer: Self-pay

## 2021-02-02 ENCOUNTER — Ambulatory Visit (INDEPENDENT_AMBULATORY_CARE_PROVIDER_SITE_OTHER): Payer: No Typology Code available for payment source | Admitting: Licensed Clinical Social Worker

## 2021-02-02 DIAGNOSIS — F419 Anxiety disorder, unspecified: Secondary | ICD-10-CM | POA: Diagnosis not present

## 2021-02-02 DIAGNOSIS — F331 Major depressive disorder, recurrent, moderate: Secondary | ICD-10-CM

## 2021-02-02 DIAGNOSIS — F4310 Post-traumatic stress disorder, unspecified: Secondary | ICD-10-CM

## 2021-02-02 NOTE — Progress Notes (Signed)
Virtual Visit via Video Note  I connected with Mark [name] on 02/02/21 at 2:00pm EST by video-enabled telemedicine with the correct person using two identifiers.   I discussed the limitations of evaluation and management by telemedicine and the availability of in person appointments. The patient expressed understanding and agreed to proceed.  LOCATION: Patient: home Provider: home office    History of Present Illness: Pt was referred to [facility] OP therapy for PTSD, depression and anxiety by the [facility].      Observations/Objective: Patient presented for todays session on time and was alert, oriented x5, with no evidence or self-report of SI/HI or A/V H.  Patient reported ongoing compliance with medication. Clinician inquired about patients current emotional ratings, as well as any significant changes in thoughts, feelings or behavior since previous session. Patient reported emotional rating scores of 6/10 for depression, 6/10 for anxiety, 4/10 for anger/irritability. Cln and pt explored his emotional ratings and coping skills. Pt discussed his new years goals: work, family, mental wellness, physical wellness, and strategies  Pt reports,  "I'm living in fear, my 2 bosses that hired me were let go. I'm afraid I'm going to be next."Cln used CBT, to assist pt manage his fears by helping him gradually change the way he thinks, showing him the interconnectedness of thoughts, beliefs, feelings, and behaviors.  Assessment and Plan: Counselor will continue to meet with patient to address treatment plan goals. Patient will continue to follow recommendations of providers and implement skills learned in session.   Follow Up Instructions I discussed the assessment and treatment plan with the patient. The patient was provided an opportunity to ask questions and all were answered. The patient agreed with the plan and demonstrated an understanding of the instructions.   The patient was advised to call  back or seek an in-person evaluation if the symptoms worsen or if the condition fails to improve as anticipated.  I provided 45 minutes of non-face-to-face time during this encounter.   Santresa Levett S, LCAS  .

## 2021-02-06 ENCOUNTER — Ambulatory Visit (INDEPENDENT_AMBULATORY_CARE_PROVIDER_SITE_OTHER): Payer: No Typology Code available for payment source | Admitting: Licensed Clinical Social Worker

## 2021-02-06 ENCOUNTER — Other Ambulatory Visit: Payer: Self-pay

## 2021-02-06 ENCOUNTER — Encounter (HOSPITAL_COMMUNITY): Payer: Self-pay | Admitting: Licensed Clinical Social Worker

## 2021-02-06 DIAGNOSIS — F4310 Post-traumatic stress disorder, unspecified: Secondary | ICD-10-CM | POA: Diagnosis not present

## 2021-02-06 DIAGNOSIS — F419 Anxiety disorder, unspecified: Secondary | ICD-10-CM | POA: Diagnosis not present

## 2021-02-06 DIAGNOSIS — F331 Major depressive disorder, recurrent, moderate: Secondary | ICD-10-CM

## 2021-02-06 NOTE — Progress Notes (Signed)
Virtual Visit via Video Note  I connected with Mark [name] on 02/06/21 at 5:00 pm EST by a video enabled telemedicine application and verified that I am speaking with the correct person using two identifiers.   I discussed the limitations of evaluation and management by telemedicine and the availability of in person appointments. The patient expressed understanding and agreed to proceed.  LOCATION: Patient: Home Provider: home Office  History of Present Illness: [facility], depression, anxiety.   Observation/Objective:    Patient participated in a discussion on Diversion Strategies as a coping strategy during periods of distressed emotions. Clinician provided education on diversion. Clinician suggested patient use coping strategies to assist with periods of distressed emotions.   Assessment and Plan: Counselor will continue to meet with patient address treatment plan goals. Patient was given a chance for recommendations of providers and implement skills learned in session.     Follow-up instructions: I discussed the assessment and treatment plan with the patient.           Patient was provided an opportunity to ask questions and all were answered. The patient agreed with the plan and demonstrated an understanding of the instructions.   The patient was advised to call back or seek an in-person evaluation if the symptoms worsen or if the condition fails to improve as anticipated.  I provided 60 minutes of non-face-to-face time during this encounter.   Kalid Ghan S, LCAS

## 2021-02-09 ENCOUNTER — Other Ambulatory Visit: Payer: Self-pay

## 2021-02-09 ENCOUNTER — Ambulatory Visit (INDEPENDENT_AMBULATORY_CARE_PROVIDER_SITE_OTHER): Payer: No Typology Code available for payment source | Admitting: Licensed Clinical Social Worker

## 2021-02-09 ENCOUNTER — Encounter (HOSPITAL_COMMUNITY): Payer: Self-pay | Admitting: Licensed Clinical Social Worker

## 2021-02-09 DIAGNOSIS — F419 Anxiety disorder, unspecified: Secondary | ICD-10-CM | POA: Diagnosis not present

## 2021-02-09 DIAGNOSIS — F331 Major depressive disorder, recurrent, moderate: Secondary | ICD-10-CM

## 2021-02-09 DIAGNOSIS — F4310 Post-traumatic stress disorder, unspecified: Secondary | ICD-10-CM

## 2021-02-09 NOTE — Progress Notes (Signed)
Virtual Visit via Video Note  I connected with Mark [name] on 02/09/21 at 2:00pm EST by video-enabled telemedicine with the correct person using two identifiers.   I discussed the limitations of evaluation and management by telemedicine and the availability of in person appointments. The patient expressed understanding and agreed to proceed.  LOCATION: Patient: home Provider: home office    History of Present Illness: Pt was referred to [facility] OP therapy for PTSD, depression and anxiety by the [facility].      Observations/Objective: Patient presented for todays session on time and was alert, oriented x5, with no evidence or self-report of SI/HI or A/V H.  Patient reported ongoing compliance with medication. Clinician inquired about patients current emotional ratings, as well as any significant changes in thoughts, feelings or behavior since previous session. Patient reported emotional rating scores of 7/10 for depression, 7/10 for anxiety, 6/10 for anger/irritability. Cln and pt explored his emotional ratings and coping skills. Pt discussed his new years goals: work, family, mental wellness, physical wellness. Pt reports "I finally got my  new wheel chair. I will finally be independent again." Cln and pt explored his lack of independence, not being able to leave the house, which have increased his emotional ratings. Cln and pt explored ways to ease back into independence: church, meeting someone for lunch, gym, running errands, grocery shopping. Clinician allowed space for further processing of emotions and provided supportive statements throughout session.     Assessment and Plan: Counselor will continue to meet with patient to address treatment plan goals. Patient will continue to follow recommendations of providers and implement skills learned in session.   Follow Up Instructions I discussed the assessment and treatment plan with the patient. The patient was provided an opportunity to  ask questions and all were answered. The patient agreed with the plan and demonstrated an understanding of the instructions.   The patient was advised to call back or seek an in-person evaluation if the symptoms worsen or if the condition fails to improve as anticipated.  I provided 45 minutes of non-face-to-face time during this encounter.   Cidney Kirkwood S, LCAS  .

## 2021-02-13 ENCOUNTER — Encounter (HOSPITAL_COMMUNITY): Payer: Self-pay | Admitting: Licensed Clinical Social Worker

## 2021-02-13 ENCOUNTER — Other Ambulatory Visit: Payer: Self-pay

## 2021-02-13 ENCOUNTER — Ambulatory Visit (INDEPENDENT_AMBULATORY_CARE_PROVIDER_SITE_OTHER): Payer: No Typology Code available for payment source | Admitting: Licensed Clinical Social Worker

## 2021-02-13 DIAGNOSIS — F4310 Post-traumatic stress disorder, unspecified: Secondary | ICD-10-CM | POA: Diagnosis not present

## 2021-02-13 DIAGNOSIS — F419 Anxiety disorder, unspecified: Secondary | ICD-10-CM

## 2021-02-13 DIAGNOSIS — F331 Major depressive disorder, recurrent, moderate: Secondary | ICD-10-CM | POA: Diagnosis not present

## 2021-02-13 NOTE — Progress Notes (Signed)
Virtual Visit via Video Note  I connected with Mark [name] on 02/13/21 at 5:00 pm EST by a video enabled telemedicine application and verified that I am speaking with the correct person using two identifiers.   I discussed the limitations of evaluation and management by telemedicine and the availability of in person appointments. The patient expressed understanding and agreed to proceed.  LOCATION: Patient: Home Provider: home Office  History of Present Illness: [facility], depression, anxiety.   Observation/Objective:    Patient participated in a discussion on overcoming life's challenges, under stress, which makes it more difficult to overcome obstacles in life. Patient was encouraged to identify life challenges and explore strategies for overcoming obstacles.    Assessment and Plan: Counselor will continue to meet with patient address treatment plan goals. Patient was given a chance for recommendations of providers and implement skills learned in session.     Follow-up instructions: I discussed the assessment and treatment plan with the patient.           Patient was provided an opportunity to ask questions and all were answered. The patient agreed with the plan and demonstrated an understanding of the instructions.   The patient was advised to call back or seek an in-person evaluation if the symptoms worsen or if the condition fails to improve as anticipated.  I provided 60 minutes of non-face-to-face time during this encounter.   Mark [name] S, LCAS

## 2021-02-16 ENCOUNTER — Other Ambulatory Visit: Payer: Self-pay

## 2021-02-16 ENCOUNTER — Ambulatory Visit (INDEPENDENT_AMBULATORY_CARE_PROVIDER_SITE_OTHER): Payer: No Typology Code available for payment source | Admitting: Licensed Clinical Social Worker

## 2021-02-16 ENCOUNTER — Encounter (HOSPITAL_COMMUNITY): Payer: Self-pay | Admitting: Licensed Clinical Social Worker

## 2021-02-16 DIAGNOSIS — F4310 Post-traumatic stress disorder, unspecified: Secondary | ICD-10-CM

## 2021-02-16 DIAGNOSIS — F419 Anxiety disorder, unspecified: Secondary | ICD-10-CM | POA: Diagnosis not present

## 2021-02-16 DIAGNOSIS — F331 Major depressive disorder, recurrent, moderate: Secondary | ICD-10-CM | POA: Diagnosis not present

## 2021-02-16 DIAGNOSIS — R41844 Frontal lobe and executive function deficit: Secondary | ICD-10-CM | POA: Insufficient documentation

## 2021-02-16 NOTE — Progress Notes (Signed)
Virtual Visit via Video Note  I connected with Mark [name] on 02/16/21 at 2:00pm EST by video-enabled telemedicine with the correct person using two identifiers.   I discussed the limitations of evaluation and management by telemedicine and the availability of in person appointments. The patient expressed understanding and agreed to proceed.  LOCATION: Patient: home Provider: home office    History of Present Illness: Pt was referred to [facility] OP therapy for PTSD, depression and anxiety by the [facility].      Observations/Objective: Patient presented for todays session on time and was alert, oriented x5, with no evidence or self-report of SI/HI or A/V H.  Patient reported ongoing compliance with medication. Clinician inquired about patients current emotional ratings, as well as any significant changes in thoughts, feelings or behavior since previous session. Patient reported emotional rating scores of 7/10 for depression, 8/10 for anxiety, 6/10 for anger/irritability. Cln and pt explored his emotional ratings and coping skills. Pt reports, "my wheelchair is not fixed yet. I'm trying not to get very angry." Cln used CBT for anger, anger is built on unmet expectations. Cln used CBT techniques: deep breathing and muscle relaxation, cognitive restructuring, problem-solving, and behavioral rehearsal.  Assessment and Plan: Counselor will continue to meet with patient to address treatment plan goals. Patient will continue to follow recommendations of providers and implement skills learned in session.   Follow Up Instructions I discussed the assessment and treatment plan with the patient. The patient was provided an opportunity to ask questions and all were answered. The patient agreed with the plan and demonstrated an understanding of the instructions.   The patient was advised to call back or seek an in-person evaluation if the symptoms worsen or if the condition fails to improve as  anticipated.  I provided 45 minutes of non-face-to-face time during this encounter.   Rosina Cressler S, LCAS  .

## 2021-02-20 ENCOUNTER — Ambulatory Visit (INDEPENDENT_AMBULATORY_CARE_PROVIDER_SITE_OTHER): Payer: No Typology Code available for payment source | Admitting: Licensed Clinical Social Worker

## 2021-02-20 ENCOUNTER — Encounter (HOSPITAL_COMMUNITY): Payer: Self-pay | Admitting: Licensed Clinical Social Worker

## 2021-02-20 ENCOUNTER — Other Ambulatory Visit: Payer: Self-pay

## 2021-02-20 DIAGNOSIS — F4310 Post-traumatic stress disorder, unspecified: Secondary | ICD-10-CM

## 2021-02-20 DIAGNOSIS — F331 Major depressive disorder, recurrent, moderate: Secondary | ICD-10-CM | POA: Diagnosis not present

## 2021-02-20 DIAGNOSIS — F419 Anxiety disorder, unspecified: Secondary | ICD-10-CM

## 2021-02-20 NOTE — Progress Notes (Signed)
Virtual Visit via Video Note  I connected with Mark [name] on 02/20/21 at 5:00 pm EST by a video enabled telemedicine application and verified that I am speaking with the correct person using two identifiers.   I discussed the limitations of evaluation and management by telemedicine and the availability of in person appointments. The patient expressed understanding and agreed to proceed.  LOCATION: Patient: Home Provider: home Office  History of Present Illness: [facility], depression, anxiety.   Observation/Objective:    Patient participated in a discussion on low frustration tolerance. Clinician provided psychoeducation on low frustration tolerance using ACCEPTS.  Patient was encouraged to work on increasing frustration tolerance.     Assessment and Plan: Counselor will continue to meet with patient address treatment plan goals. Patient was given a chance for recommendations of providers and implement skills learned in session.     Follow-up instructions: I discussed the assessment and treatment plan with the patient.           Patient was provided an opportunity to ask questions and all were answered. The patient agreed with the plan and demonstrated an understanding of the instructions.   The patient was advised to call back or seek an in-person evaluation if the symptoms worsen or if the condition fails to improve as anticipated.  I provided 75 minutes of non-face-to-face time during this encounter.   Demarques Pilz S, LCAS

## 2021-02-23 ENCOUNTER — Ambulatory Visit (INDEPENDENT_AMBULATORY_CARE_PROVIDER_SITE_OTHER): Payer: No Typology Code available for payment source | Admitting: Licensed Clinical Social Worker

## 2021-02-23 ENCOUNTER — Encounter (HOSPITAL_COMMUNITY): Payer: Self-pay | Admitting: Licensed Clinical Social Worker

## 2021-02-23 ENCOUNTER — Other Ambulatory Visit: Payer: Self-pay

## 2021-02-23 DIAGNOSIS — F331 Major depressive disorder, recurrent, moderate: Secondary | ICD-10-CM | POA: Diagnosis not present

## 2021-02-23 DIAGNOSIS — F4310 Post-traumatic stress disorder, unspecified: Secondary | ICD-10-CM | POA: Diagnosis not present

## 2021-02-23 DIAGNOSIS — F419 Anxiety disorder, unspecified: Secondary | ICD-10-CM

## 2021-02-23 NOTE — Progress Notes (Signed)
Virtual Visit via Video Note  I connected with Mark [name] on 02/23/21 at 2:00pm EST by video-enabled telemedicine with the correct person using two identifiers.   I discussed the limitations of evaluation and management by telemedicine and the availability of in person appointments. The patient expressed understanding and agreed to proceed.  LOCATION: Patient: home Provider: home office    History of Present Illness: Pt was referred to [facility] OP therapy for PTSD, depression and anxiety by the [name].      Observations/Objective: Patient presented for todays session on time and was alert, oriented x5, with no evidence or self-report of SI/HI or A/V H.  Patient reported ongoing compliance with medication. Clinician inquired about patients current emotional ratings, as well as any significant changes in thoughts, feelings or behavior since previous session. Patient reported emotional rating scores of 7/10 for depression, 8/10 for anxiety, 6/10 for anger/irritability. Cln and pt explored his emotional ratings and coping skills. Pt again reports, "my wheelchair is not fixed yet. I'm trying not to get very angry." Cln used DBT techniques: ACCEPTS, Distress Tolerance Skills to assist patient with his low frustration tolerance. Pt emailed 2 pages of Distress Tolerance Skills for review.  Assessment and Plan: Counselor will continue to meet with patient to address treatment plan goals. Patient will continue to follow recommendations of providers and implement skills learned in session.   Follow Up Instructions I discussed the assessment and treatment plan with the patient. The patient was provided an opportunity to ask questions and all were answered. The patient agreed with the plan and demonstrated an understanding of the instructions.   The patient was advised to call back or seek an in-person evaluation if the symptoms worsen or if the condition fails to improve as anticipated.  I provided  45 minutes of non-face-to-face time during this encounter.   Margaux Engen S, LCAS  .

## 2021-02-27 ENCOUNTER — Ambulatory Visit (INDEPENDENT_AMBULATORY_CARE_PROVIDER_SITE_OTHER): Payer: No Typology Code available for payment source | Admitting: Licensed Clinical Social Worker

## 2021-02-27 ENCOUNTER — Other Ambulatory Visit: Payer: Self-pay

## 2021-02-27 ENCOUNTER — Encounter (HOSPITAL_COMMUNITY): Payer: Self-pay | Admitting: Licensed Clinical Social Worker

## 2021-02-27 DIAGNOSIS — F4310 Post-traumatic stress disorder, unspecified: Secondary | ICD-10-CM | POA: Diagnosis not present

## 2021-02-27 DIAGNOSIS — F331 Major depressive disorder, recurrent, moderate: Secondary | ICD-10-CM

## 2021-02-27 DIAGNOSIS — F419 Anxiety disorder, unspecified: Secondary | ICD-10-CM | POA: Diagnosis not present

## 2021-02-27 NOTE — Progress Notes (Signed)
Virtual Visit via Video Note  I connected with [name] on 02/27/21 at 5:00 pm EST by a video enabled telemedicine application and verified that I am speaking with the correct person using two identifiers.   I discussed the limitations of evaluation and management by telemedicine and the availability of in person appointments. The patient expressed understanding and agreed to proceed.  LOCATION: Patient: Home Provider: home Office  History of Present Illness: [facility], depression, anxiety.   Observation/Objective:      Patient participated in a discussion on low frustration tolerance. Clinician provided psychoeducation on low frustration tolerance using Radical acceptance.  Patient was encouraged to work on increasing frustration tolerance.   Assessment and Plan: Counselor will continue to meet with patient address treatment plan goals. Patient was given a chance for recommendations of providers and implement skills learned in session.     Follow-up instructions: I discussed the assessment and treatment plan with the patient.           Patient was provided an opportunity to ask questions and all were answered. The patient agreed with the plan and demonstrated an understanding of the instructions.   The patient was advised to call back or seek an in-person evaluation if the symptoms worsen or if the condition fails to improve as anticipated.  I provided 120 minutes of non-face-to-face time during this encounter.   Akera Snowberger S, LCAS

## 2021-03-02 ENCOUNTER — Other Ambulatory Visit: Payer: Self-pay

## 2021-03-02 ENCOUNTER — Encounter (HOSPITAL_COMMUNITY): Payer: Self-pay | Admitting: Licensed Clinical Social Worker

## 2021-03-02 ENCOUNTER — Ambulatory Visit (INDEPENDENT_AMBULATORY_CARE_PROVIDER_SITE_OTHER): Payer: No Typology Code available for payment source | Admitting: Licensed Clinical Social Worker

## 2021-03-02 DIAGNOSIS — F4310 Post-traumatic stress disorder, unspecified: Secondary | ICD-10-CM

## 2021-03-02 DIAGNOSIS — F419 Anxiety disorder, unspecified: Secondary | ICD-10-CM

## 2021-03-02 DIAGNOSIS — F331 Major depressive disorder, recurrent, moderate: Secondary | ICD-10-CM | POA: Diagnosis not present

## 2021-03-02 NOTE — Progress Notes (Signed)
Virtual Visit via Video Note  I connected with Mark [name] on 02/23/21 at 2:00pm EST by video-enabled telemedicine with the correct person using two identifiers.   I discussed the limitations of evaluation and management by telemedicine and the availability of in person appointments. The patient expressed understanding and agreed to proceed.  LOCATION: Patient: home Provider: home office    History of Present Illness: Pt was referred to [facility] OP therapy for PTSD, depression and anxiety by the [facility].      Observations/Objective: Patient presented for todays session on time and was alert, oriented x5, with no evidence or self-report of SI/HI or A/V H.  Patient reported ongoing compliance with medication. Clinician inquired about patients current emotional ratings, as well as any significant changes in thoughts, feelings or behavior since previous session. Patient reported emotional rating scores of 7/10 for depression, 8/10 for anxiety, 6/10 for anger/irritability. Cln and pt explored his emotional ratings and coping skills. Pt reports he's still experiencing low frustration tolerance. Cln sent pt CBT skills for LFT and how to increase frustration tolerance. Cln and pt role-played Radical Acceptance.   Assessment and Plan: Counselor will continue to meet with patient to address treatment plan goals. Patient will continue to follow recommendations of providers and implement skills learned in session.   Follow Up Instructions I discussed the assessment and treatment plan with the patient. The patient was provided an opportunity to ask questions and all were answered. The patient agreed with the plan and demonstrated an understanding of the instructions.   The patient was advised to call back or seek an in-person evaluation if the symptoms worsen or if the condition fails to improve as anticipated.  I provided 45 minutes of non-face-to-face time during this  encounter.   Lavene Penagos S, LCAS  .

## 2021-03-06 ENCOUNTER — Ambulatory Visit (INDEPENDENT_AMBULATORY_CARE_PROVIDER_SITE_OTHER): Payer: No Typology Code available for payment source | Admitting: Licensed Clinical Social Worker

## 2021-03-06 ENCOUNTER — Encounter (HOSPITAL_COMMUNITY): Payer: Self-pay | Admitting: Licensed Clinical Social Worker

## 2021-03-06 ENCOUNTER — Other Ambulatory Visit: Payer: Self-pay

## 2021-03-06 DIAGNOSIS — F419 Anxiety disorder, unspecified: Secondary | ICD-10-CM

## 2021-03-06 DIAGNOSIS — F4310 Post-traumatic stress disorder, unspecified: Secondary | ICD-10-CM

## 2021-03-06 DIAGNOSIS — F331 Major depressive disorder, recurrent, moderate: Secondary | ICD-10-CM | POA: Diagnosis not present

## 2021-03-06 NOTE — Progress Notes (Signed)
Virtual Visit via Video Note  I connected with Mark [name] on 03/06/21 at 5:00 pm EST by a video enabled telemedicine application and verified that I am speaking with the correct person using two identifiers.   I discussed the limitations of evaluation and management by telemedicine and the availability of in person appointments. The patient expressed understanding and agreed to proceed.  LOCATION: Patient: Home Provider: home Office  History of Present Illness: [facility], [facility], [facility].   Observation/Objective:    Patient participated in a discussion on shaping the lens through which the brain views the world, that shapes reality. Discussed changing the lens, which can change the world of personal happiness. Patient was encourage to changing the lens to find personal happiness.    Assessment and Plan: Counselor will continue to meet with patient address treatment plan goals. Patient was given a chance for recommendations of providers and implement skills learned in session.     Follow-up instructions: I discussed the assessment and treatment plan with the patient.           Patient was provided an opportunity to ask questions and all were answered. The patient agreed with the plan and demonstrated an understanding of the instructions.   The patient was advised to call back or seek an in-person evaluation if the symptoms worsen or if the condition fails to improve as anticipated.  I provided 90 minutes of non-face-to-face time during this encounter.   Mark [name], LCAS

## 2021-03-09 ENCOUNTER — Encounter (HOSPITAL_COMMUNITY): Payer: Self-pay | Admitting: Licensed Clinical Social Worker

## 2021-03-09 ENCOUNTER — Other Ambulatory Visit: Payer: Self-pay

## 2021-03-09 ENCOUNTER — Ambulatory Visit (INDEPENDENT_AMBULATORY_CARE_PROVIDER_SITE_OTHER): Payer: No Typology Code available for payment source | Admitting: Licensed Clinical Social Worker

## 2021-03-09 DIAGNOSIS — F331 Major depressive disorder, recurrent, moderate: Secondary | ICD-10-CM

## 2021-03-09 DIAGNOSIS — F4310 Post-traumatic stress disorder, unspecified: Secondary | ICD-10-CM

## 2021-03-09 DIAGNOSIS — F419 Anxiety disorder, unspecified: Secondary | ICD-10-CM

## 2021-03-09 NOTE — Progress Notes (Signed)
Virtual Visit via Video Note  I connected with Mark [name] on 03/09/21 at 2:00pm EST by video-enabled telemedicine with the correct person using two identifiers.   I discussed the limitations of evaluation and management by telemedicine and the availability of in person appointments. The patient expressed understanding and agreed to proceed.  LOCATION: Patient: home Provider: home office    History of Present Illness: Pt was referred to [facility] OP therapy for PTSD, depression and anxiety by the [facility].      Observations/Objective: Patient presented for todays session on time and was alert, oriented x5, with no evidence or self-report of SI/HI or A/V H.  Patient reported ongoing compliance with medication. Clinician inquired about patients current emotional ratings, as well as any significant changes in thoughts, feelings or behavior since previous session. Patient reported emotional rating scores of 7/10 for depression, 8/10 for anxiety, 6/10 for anger/irritability. Cln and pt explored his emotional ratings and coping skills. Cln emailed pt a link for a wellness class at [employer] [facility] [employer]: Happiness through gratitude, meditation, Savoring. Cln and pt explored the benefits of this class for patient as he struggles with on-going happiness. The snippets of happiness he experiences increases his depressive symptoms because it's not continuous. Cln assisted pt in verbalizing hopeful and positive statements regarding the future beyond depression.    Assessment and Plan: Counselor will continue to meet with patient to address treatment plan goals. Patient will continue to follow recommendations of providers and implement skills learned in session.   Follow Up Instructions I discussed the assessment and treatment plan with the patient. The patient was provided an opportunity to ask questions and all were answered. The patient agreed with the plan and demonstrated an understanding  of the instructions.   The patient was advised to call back or seek an in-person evaluation if the symptoms worsen or if the condition fails to improve as anticipated.  I provided 45 minutes of non-face-to-face time during this encounter.   Carlyn Lemke S, LCAS  .

## 2021-03-13 ENCOUNTER — Encounter (HOSPITAL_COMMUNITY): Payer: Self-pay | Admitting: Licensed Clinical Social Worker

## 2021-03-13 ENCOUNTER — Other Ambulatory Visit: Payer: Self-pay

## 2021-03-13 ENCOUNTER — Ambulatory Visit (INDEPENDENT_AMBULATORY_CARE_PROVIDER_SITE_OTHER): Payer: No Typology Code available for payment source | Admitting: Licensed Clinical Social Worker

## 2021-03-13 DIAGNOSIS — F419 Anxiety disorder, unspecified: Secondary | ICD-10-CM | POA: Diagnosis not present

## 2021-03-13 DIAGNOSIS — F4310 Post-traumatic stress disorder, unspecified: Secondary | ICD-10-CM | POA: Diagnosis not present

## 2021-03-13 DIAGNOSIS — F331 Major depressive disorder, recurrent, moderate: Secondary | ICD-10-CM

## 2021-03-13 NOTE — Progress Notes (Signed)
Virtual Visit via Video Note  I connected with Mark [name] on 03/13/21 at 5:00 pm EST by a video enabled telemedicine application and verified that I am speaking with the correct person using two identifiers.   I discussed the limitations of evaluation and management by telemedicine and the availability of in person appointments. The patient expressed understanding and agreed to proceed.  LOCATION: Patient: Home Provider: home Office  History of Present Illness: [facility], depression, anxiety.   Observation/Objective:    Pt participated in a discussion on family dysfunction. Clinician assessed patient's family dynamics and discussed how the dynamics influence mental health symptoms in a negative way. Patient was encouraged to work to improve family function.    Assessment and Plan: Counselor will continue to meet with patient address treatment plan goals. Patient was given a chance for recommendations of providers and implement skills learned in session.     Follow-up instructions: I discussed the assessment and treatment plan with the patient.           Patient was provided an opportunity to ask questions and all were answered. The patient agreed with the plan and demonstrated an understanding of the instructions.   The patient was advised to call back or seek an in-person evaluation if the symptoms worsen or if the condition fails to improve as anticipated.  Collaboration of Care: Other No collaboration of care was used at this session.  Patient/Guardian was advised Release of Information must be obtained prior to any record release in order to collaborate their care with an outside provider. Patient/Guardian was advised if they have not already done so to contact the registration department to sign all necessary forms in order for [name] to release information regarding their care.   Consent: Patient/Guardian gives verbal consent for  treatment and assignment of benefits for services provided during this visit. Patient/Guardian expressed understanding and agreed to proceed.      I provided 90 minutes of non-face-to-face time during this encounter.   Aaron Boeh S, LCAS

## 2021-03-16 ENCOUNTER — Other Ambulatory Visit: Payer: Self-pay

## 2021-03-16 ENCOUNTER — Ambulatory Visit (INDEPENDENT_AMBULATORY_CARE_PROVIDER_SITE_OTHER): Payer: No Typology Code available for payment source | Admitting: Licensed Clinical Social Worker

## 2021-03-16 DIAGNOSIS — F331 Major depressive disorder, recurrent, moderate: Secondary | ICD-10-CM

## 2021-03-16 DIAGNOSIS — F419 Anxiety disorder, unspecified: Secondary | ICD-10-CM

## 2021-03-16 DIAGNOSIS — F4310 Post-traumatic stress disorder, unspecified: Secondary | ICD-10-CM

## 2021-03-20 ENCOUNTER — Ambulatory Visit (HOSPITAL_COMMUNITY): Payer: No Typology Code available for payment source | Admitting: Licensed Clinical Social Worker

## 2021-03-20 ENCOUNTER — Other Ambulatory Visit: Payer: Self-pay

## 2021-03-21 ENCOUNTER — Encounter (HOSPITAL_COMMUNITY): Payer: Self-pay | Admitting: Licensed Clinical Social Worker

## 2021-03-21 NOTE — Progress Notes (Signed)
Virtual Visit via Video Note  I connected with Mark [name] on 03/16/21 at 2:00pm EST by video-enabled telemedicine with the correct person using two identifiers.   I discussed the limitations of evaluation and management by telemedicine and the availability of in person appointments. The patient expressed understanding and agreed to proceed.  LOCATION: Patient: home Provider: home office    History of Present Illness: Pt was referred to [facility] OP therapy for PTSD, depression and anxiety by the [facility].      Observations/Objective: Patient presented for todays session on time and was alert, oriented x5, with no evidence or self-report of SI/HI or A/V H.  Patient reported ongoing compliance with medication. Clinician inquired about patients current emotional ratings, as well as any significant changes in thoughts, feelings or behavior since previous session. Patient reported emotional rating scores of 7/10 for depression, 8/10 for anxiety, 6/10 for anger/irritability. Cln and pt explored his emotional ratings and coping skills. Pt reports "I'm going to visit my grandson 1 yo birthday in [place]. My daughter in [place] was supposed to go with me but she backed out. I'm hurt and angry." Clinician utilized CBT to address thought processes. Clinician provided thought stopping tools, as well as reality testing to provide support.  Assessment and Plan: Counselor will continue to meet with patient to address treatment plan goals. Patient will continue to follow recommendations of providers and implement skills learned in session.   Follow Up Instructions I discussed the assessment and treatment plan with the patient. The patient was provided an opportunity to ask questions and all were answered. The patient agreed with the plan and demonstrated an understanding of the instructions.   The patient was advised to call back or seek an in-person evaluation if the symptoms worsen or if the condition  fails to improve as anticipated.  I provided 45 minutes of non-face-to-face time during this encounter.   Makayela Secrest S, LCAS  .

## 2021-03-23 ENCOUNTER — Encounter (HOSPITAL_COMMUNITY): Payer: Self-pay | Admitting: Licensed Clinical Social Worker

## 2021-03-23 ENCOUNTER — Other Ambulatory Visit: Payer: Self-pay

## 2021-03-23 ENCOUNTER — Telehealth (HOSPITAL_COMMUNITY): Payer: Self-pay | Admitting: Psychiatry

## 2021-03-23 ENCOUNTER — Ambulatory Visit (INDEPENDENT_AMBULATORY_CARE_PROVIDER_SITE_OTHER): Payer: No Typology Code available for payment source | Admitting: Licensed Clinical Social Worker

## 2021-03-23 DIAGNOSIS — F4310 Post-traumatic stress disorder, unspecified: Secondary | ICD-10-CM

## 2021-03-23 DIAGNOSIS — F419 Anxiety disorder, unspecified: Secondary | ICD-10-CM | POA: Diagnosis not present

## 2021-03-23 DIAGNOSIS — F331 Major depressive disorder, recurrent, moderate: Secondary | ICD-10-CM

## 2021-03-23 NOTE — Progress Notes (Signed)
Virtual Visit via Video Note ? ?I connected with Mark [name] on 03/23/21 at 2:00pm EST by video-enabled telemedicine with the correct person using two identifiers. ?  ?I discussed the limitations of evaluation and management by telemedicine and the availability of in person appointments. The patient expressed understanding and agreed to proceed. ? ?LOCATION: ?Patient: family home ?Provider: home office ? ? ? ?History of Present Illness: Pt was referred to [facility] OP therapy for PTSD, depression and anxiety by the [facility]. ?  ?   ?Observations/Objective: Patient presented for today?s session on time and was alert, oriented x5, with no evidence or self-report of SI/HI or A/V H.  Patient reported ongoing compliance with medication. Clinician inquired about patient?s current emotional ratings, as well as any significant changes in thoughts, feelings or behavior since previous session. Patient reported emotional rating scores of 7/10 for depression, 8/10 for anxiety, 6/10 for anger/irritability. Cln and pt explored his emotional ratings and coping skills. Pt reports on his trip to see his grandson 59 yo birthday in [place]. I came back through [place] and stopped to visit extended . Cln asked open ended questions. "While I was visiting my grandson I had fleeting feelings of happiness. After I left and went by my extended family home it brought back mixed emotions of depression and anxiety.Clinician utilized CBT to address thought processes. Clinician provided thought stopping tools, as well as reality testing to provide support. ? ?Assessment and Plan: Counselor will continue to meet with patient to address treatment plan goals. Patient will continue to follow recommendations of providers and implement skills learned in session. ?  ?Follow Up Instructions I discussed the assessment and treatment plan with the patient. The patient was provided an opportunity to ask questions and all were answered. The patient agreed  with the plan and demonstrated an understanding of the instructions. ?  ?The patient was advised to call back or seek an in-person evaluation if the symptoms worsen or if the condition fails to improve as anticipated. ? ?I provided 45 minutes of non-face-to-face time during this encounter. ? ? ?Mark [name] S, LCAS ? ?. ? ? ?

## 2021-03-23 NOTE — Telephone Encounter (Signed)
Pt left vm asking if Daleen Snook got his paperwork. I tried to call to ask him what paperwork he was referring to but was unable to reach the pt. Epic message sent to [place] about the pt. ?

## 2021-03-24 ENCOUNTER — Encounter (HOSPITAL_COMMUNITY): Payer: Self-pay | Admitting: Psychiatry

## 2021-03-24 ENCOUNTER — Telehealth (INDEPENDENT_AMBULATORY_CARE_PROVIDER_SITE_OTHER): Payer: No Typology Code available for payment source | Admitting: Psychiatry

## 2021-03-24 DIAGNOSIS — F331 Major depressive disorder, recurrent, moderate: Secondary | ICD-10-CM

## 2021-03-24 DIAGNOSIS — F4310 Post-traumatic stress disorder, unspecified: Secondary | ICD-10-CM | POA: Diagnosis not present

## 2021-03-24 DIAGNOSIS — F411 Generalized anxiety disorder: Secondary | ICD-10-CM

## 2021-03-24 MED ORDER — PRAZOSIN HCL 2 MG PO CAPS: 2.0000 mg | ORAL_CAPSULE | Freq: Every day | ORAL | 2 refills | Status: AC
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
## 2021-03-27 ENCOUNTER — Ambulatory Visit (INDEPENDENT_AMBULATORY_CARE_PROVIDER_SITE_OTHER): Payer: No Typology Code available for payment source | Admitting: Licensed Clinical Social Worker

## 2021-03-27 ENCOUNTER — Other Ambulatory Visit: Payer: Self-pay

## 2021-03-27 DIAGNOSIS — F4310 Post-traumatic stress disorder, unspecified: Secondary | ICD-10-CM

## 2021-03-27 DIAGNOSIS — F331 Major depressive disorder, recurrent, moderate: Secondary | ICD-10-CM | POA: Diagnosis not present

## 2021-03-27 DIAGNOSIS — F411 Generalized anxiety disorder: Secondary | ICD-10-CM

## 2021-03-30 ENCOUNTER — Other Ambulatory Visit: Payer: Self-pay

## 2021-03-30 ENCOUNTER — Ambulatory Visit (INDEPENDENT_AMBULATORY_CARE_PROVIDER_SITE_OTHER): Payer: No Typology Code available for payment source | Admitting: Licensed Clinical Social Worker

## 2021-03-30 ENCOUNTER — Encounter (HOSPITAL_COMMUNITY): Payer: Self-pay | Admitting: Licensed Clinical Social Worker

## 2021-03-30 DIAGNOSIS — F411 Generalized anxiety disorder: Secondary | ICD-10-CM

## 2021-03-30 DIAGNOSIS — F4310 Post-traumatic stress disorder, unspecified: Secondary | ICD-10-CM

## 2021-03-30 DIAGNOSIS — F331 Major depressive disorder, recurrent, moderate: Secondary | ICD-10-CM

## 2021-03-30 NOTE — Progress Notes (Incomplete)
Virtual Visit via Video Note  I connected with Mark [name] on 03/30/21 at 2:00pm EST by video-enabled telemedicine with the correct person using two identifiers.   I discussed the limitations of evaluation and management by telemedicine and the availability of in person appointments. The patient expressed understanding and agreed to proceed.  LOCATION: Patient: home Provider: home office    History of Present Illness: Pt was referred to [facility] OP therapy for PTSD, depression and anxiety by the [facility].      Observations/Objective: Patient presented for todays session on time and was alert, oriented x5, with no evidence or self-report of SI/HI or A/V H.  Patient reported ongoing compliance with medication. Clinician inquired about patients current emotional ratings, as well as any significant changes in thoughts, feelings or behavior since previous session. Patient reported emotional rating scores of 7/10 for depression, 8/10 for anxiety, 6/10 for anger/irritability. Cln and pt explored his emotional ratings and coping skills. Clinician utilized CBT to address thought processes. Clinician provided thought stopping tools, as well as reality testing to provide support.  Assessment and Plan: Counselor will continue to meet with patient to address treatment plan goals. Patient will continue to follow recommendations of providers and implement skills learned in session.   Follow Up Instructions I discussed the assessment and treatment plan with the patient. The patient was provided an opportunity to ask questions and all were answered. The patient agreed with the plan and demonstrated an understanding of the instructions.   The patient was advised to call back or seek an in-person evaluation if the symptoms worsen or if the condition fails to improve as anticipated.  I provided 45 minutes of non-face-to-face time during this encounter.   Mark [name] S, LCAS  .

## 2021-04-03 ENCOUNTER — Encounter (HOSPITAL_COMMUNITY): Payer: Self-pay | Admitting: Licensed Clinical Social Worker

## 2021-04-03 ENCOUNTER — Other Ambulatory Visit: Payer: Self-pay

## 2021-04-03 ENCOUNTER — Ambulatory Visit (INDEPENDENT_AMBULATORY_CARE_PROVIDER_SITE_OTHER): Payer: No Typology Code available for payment source | Admitting: Licensed Clinical Social Worker

## 2021-04-03 DIAGNOSIS — F411 Generalized anxiety disorder: Secondary | ICD-10-CM

## 2021-04-03 DIAGNOSIS — F331 Major depressive disorder, recurrent, moderate: Secondary | ICD-10-CM

## 2021-04-03 DIAGNOSIS — F4310 Post-traumatic stress disorder, unspecified: Secondary | ICD-10-CM | POA: Diagnosis not present

## 2021-04-03 NOTE — Progress Notes (Signed)
Virtual Visit via Video Note ? ?I connected with Mark [name] on 03/27/21 at 5:00 pm EST by a video enabled telemedicine application and verified that I am speaking with the correct person using two identifiers. ?  ?I discussed the limitations of evaluation and management by telemedicine and the availability of in person appointments. The patient expressed understanding and agreed to proceed. ? ?LOCATION: ?Patient: Home ?Provider: home Office ? ?History of Present Illness: [facility] ? ?Treatment Goal Address: Pt will meet with clinician weekly for therapy to monitor for progress towards goals and address any barriers to success; Reduce depression from average severity level of 8/10 down to a 4/10 in next 90 days by engaging in 3-5 positive coping skills for 1-2 hours per day as part of developing self-care routine; Reduce average anxiety level from 8/10 down to 4/10 in next 90 days by utilizing 3-5 relaxation skills/grounding skills per day, such as mindful breathing, progressive muscle relaxation, positive visualizations. ? ?Progress towards Goal:  ?Progressing ?Observation/Objective:   Patient participated in relationships: communication (listening and communicating with openness and honesty), togetherness (sharing similar values and beliefs and sharing activities (things in common). Patient was encouraged to have open and positive communication, togetherness and similar activities in relationships. ? ?Assessment and Plan: Counselor will continue to meet with patient address treatment plan goals. Patient was given a chance for recommendations of providers and implement skills learned in session. ? ? ?  Follow-up instructions: I discussed the assessment and treatment plan with the patient.           Patient was provided an opportunity to ask questions and all were answered. The patient agreed with the plan and demonstrated an understanding  of the instructions. ?  ?The patient was advised to call back or seek an in-person evaluation if the symptoms worsen or if the condition fails to improve as anticipated. ? ?Collaboration of Care: Other No collaboration of care was used at this session. ? ?Patient/Guardian was advised Release of Information must be obtained prior to any record release in order to collaborate their care with an outside provider. Patient/Guardian was advised if they have not already done so to contact the registration department to sign all necessary forms in order for [name] to release information regarding their care.  ? ?Consent: Patient/Guardian gives verbal consent for treatment and assignment of benefits for services provided during this visit. Patient/Guardian expressed understanding and agreed to proceed.  ? ? ? ? ?I provided 75 minutes of non-face-to-face time during this encounter. ? ? ?Donise Woodle S, LCAS ? ? ? ?

## 2021-04-06 ENCOUNTER — Encounter (HOSPITAL_COMMUNITY): Payer: Self-pay | Admitting: Licensed Clinical Social Worker

## 2021-04-06 ENCOUNTER — Other Ambulatory Visit: Payer: Self-pay

## 2021-04-06 ENCOUNTER — Ambulatory Visit (INDEPENDENT_AMBULATORY_CARE_PROVIDER_SITE_OTHER): Payer: No Typology Code available for payment source | Admitting: Licensed Clinical Social Worker

## 2021-04-06 DIAGNOSIS — F331 Major depressive disorder, recurrent, moderate: Secondary | ICD-10-CM | POA: Diagnosis not present

## 2021-04-06 DIAGNOSIS — F411 Generalized anxiety disorder: Secondary | ICD-10-CM | POA: Diagnosis not present

## 2021-04-06 DIAGNOSIS — F4310 Post-traumatic stress disorder, unspecified: Secondary | ICD-10-CM | POA: Diagnosis not present

## 2021-04-06 NOTE — Progress Notes (Signed)
Virtual Visit via Video Note ? ?I connected with Mark [name] on 04/06/21 at 2:00pm EST by video-enabled telemedicine with the correct person using two identifiers. ?  ?I discussed the limitations of evaluation and management by telemedicine and the availability of in person appointments. The patient expressed understanding and agreed to proceed. ? ?LOCATION: ?Patient: home ?Provider: home office ? ? ? ?History of Present Illness: Pt was referred to [facility] OP therapy for PTSD, depression and anxiety by the [facility]. ? ?Treatment Goal Addressed:   Pt will reduce irritability and increase normal social interaction with family and friends and will develop the ability to recognize, accept, and cope with feelings of depression by using practiced coping skills for depression 1-2x daily AEB self report. ? ?Progress towards Goal: ? Progressing ?   ?Observations/Objective: Patient presented for today?s session on time and was alert, oriented x5, with no evidence or self-report of SI/HI or A/V H.  Patient reported ongoing compliance with medication. Clinician inquired about patient?s current emotional ratings, as well as any significant changes in thoughts, feelings or behavior since previous session. Patient reported emotional rating scores of 8/10 for depression, 7/10 for anxiety, 5/10 for anger/irritability. Cln and pt explored his emotional ratings and coping skills. Pt reports, "my depressive symptoms have increased and my anxiety symptoms have decreased." Again, Cln and pt explored his increase in depressive symptoms: being homebound without wheelchair. Clinician utilized MI OARS to affirm concerns. Cln and pt explored opportunities to leave the house and engage with friends in social activities: Aid take him out, group member pick him up.  ? ? ? ?Collaboration of Care: No collaboration needed at this session. ?  ?Patient/Guardian was advised Release of Information must be obtained prior to any record release in  order to collaborate their care with an outside provider. Patient/Guardian was advised if they have not already done so to contact the registration department to sign all necessary forms in order for [name] to release information regarding their care.  ?  ?Consent: Patient/Guardian gives verbal consent for treatment and assignment of benefits for services provided during this visit. Patient/Guardian expressed understanding and agreed to proceed.   ? ? ?Assessment and Plan: Counselor will continue to meet with patient to address treatment plan goals. Patient will continue to follow recommendations of providers and implement skills learned in session. ?  ?Follow Up Instructions I discussed the assessment and treatment plan with the patient. The patient was provided an opportunity to ask questions and all were answered. The patient agreed with the plan and demonstrated an understanding of the instructions. ?  ?The patient was advised to call back or seek an in-person evaluation if the symptoms worsen or if the condition fails to improve as anticipated. ? ?I provided 60 minutes of non-face-to-face time during this encounter. ? ? ?Raguel Kosloski S, LCAS ? ?. ? ? ?

## 2021-04-06 NOTE — Progress Notes (Signed)
Virtual Visit via Video Note ? ?I connected with Mark [name] on 04/03/21 at 5:00 pm EST by a video enabled telemedicine application and verified that I am speaking with the correct person using two identifiers. ?  ?I discussed the limitations of evaluation and management by telemedicine and the availability of in person appointments. The patient expressed understanding and agreed to proceed. ? ?LOCATION: ?Patient: Home ?Provider: home Office ? ?History of Present Illness: [facility], [facility], [facility]. ? ?Treatment Goal Addressed: ? Pt will develop the ability to recognize, accept, and cope with feelings of depression AEB self report. ? ? ? ? ?Progress towards Goal:  ?Progressing ? ? ? ?Observation/Objective:   Group members discussed managing depressive symptoms  ?connected to the loss of health and physical ability. Patient was encouraged to receive support from  ?others, guidance, asserting his needs, allowing himself to be cared for and  ?expressing natural emotions, which can help with life challenges. ? ?Assessment and Plan: Counselor will continue to meet with patient address treatment plan goals. Patient was given a chance for recommendations of providers and implement skills learned in session. ? ? ? Follow-up instructions:I discussed the assessment and treatment plan with the patient.           Patient was provided an opportunity to ask questions and all were answered. The patient agreed with the plan and demonstrated an understanding of the instructions. ?  ?The patient was advised to call back or seek an in-person evaluation if the symptoms worsen or if the condition fails to improve as anticipated. ? ?Collaboration of Care: Other No collaboration of care was used at this session. ? ?Patient/Guardian was advised Release of Information must be obtained prior to any record release in order to collaborate their care with an outside provider.  Patient/Guardian was advised if they have not already done so to contact the registration department to sign all necessary forms in order for [name] to release information regarding their care.  ? ?Consent: Patient/Guardian gives verbal consent for treatment and assignment of benefits for services provided during this visit. Patient/Guardian expressed understanding and agreed to proceed.  ? ? ? ? ?I provided 120 minutes of non-face-to-face time during this encounter. ? ? ?Carrick Rijos S, LCAS ? ? ? ?

## 2021-04-10 ENCOUNTER — Other Ambulatory Visit: Payer: Self-pay

## 2021-04-10 ENCOUNTER — Ambulatory Visit (INDEPENDENT_AMBULATORY_CARE_PROVIDER_SITE_OTHER): Payer: No Typology Code available for payment source | Admitting: Licensed Clinical Social Worker

## 2021-04-10 DIAGNOSIS — F4310 Post-traumatic stress disorder, unspecified: Secondary | ICD-10-CM | POA: Diagnosis not present

## 2021-04-10 DIAGNOSIS — F411 Generalized anxiety disorder: Secondary | ICD-10-CM

## 2021-04-10 DIAGNOSIS — F331 Major depressive disorder, recurrent, moderate: Secondary | ICD-10-CM

## 2021-04-11 ENCOUNTER — Encounter (HOSPITAL_COMMUNITY): Payer: Self-pay | Admitting: Licensed Clinical Social Worker

## 2021-04-11 NOTE — Progress Notes (Signed)
Virtual Visit via Video Note ? ?I connected with Mark [name] on 04/10/21 at 5:00 pm EST by a video enabled telemedicine application and verified that I am speaking with the correct person using two identifiers. ?  ?I discussed the limitations of evaluation and management by telemedicine and the availability of in person appointments. The patient expressed understanding and agreed to proceed. ? ?LOCATION: ?Patient: Home ?Provider: home Office ? ?History of Present Illness: [facility], [facility] ? ?Treatment Goal Addressed: ? Pt will develop the ability to recognize, accept, and cope with feelings of depression AEB self report. ? ?Progress towards Goal:  ?Progressing ? ? ? ?Observation/Objective:   Patient participated in a group discussion on depression. Clinician provided education on the cycle of depression, depression medications available and an alternative:  TMS.  Patient was encouraged to know triggers, symptoms and available coping skills. ? ? ? ?Assessment and Plan: Counselor will continue to meet with patient address treatment plan goals. Patient was given a chance for recommendations of providers and implement skills learned in session. ? ? ? Follow-up instructions:I discussed the assessment and treatment plan with the patient.           Patient was provided an opportunity to ask questions and all were answered. The patient agreed with the plan and demonstrated an understanding of the instructions. ?  ?The patient was advised to call back or seek an in-person evaluation if the symptoms worsen or if the condition fails to improve as anticipated. ? ?Collaboration of Care: Other No collaboration of care was used at this session. ? ?Patient/Guardian was advised Release of Information must be obtained prior to any record release in order to collaborate their care with an outside provider. Patient/Guardian was advised if they have not already done  so to contact the registration department to sign all necessary forms in order for [name] to release information regarding their care.  ? ?Consent: Patient/Guardian gives verbal consent for treatment and assignment of benefits for services provided during this visit. Patient/Guardian expressed understanding and agreed to proceed.  ? ? ? ? ?I provided 120 minutes of non-face-to-face time during this encounter. ? ? ?Pharell Rolfson S, LCAS ? ? ? ?

## 2021-04-13 ENCOUNTER — Other Ambulatory Visit: Payer: Self-pay

## 2021-04-13 ENCOUNTER — Ambulatory Visit (INDEPENDENT_AMBULATORY_CARE_PROVIDER_SITE_OTHER): Payer: No Typology Code available for payment source | Admitting: Licensed Clinical Social Worker

## 2021-04-13 DIAGNOSIS — F4310 Post-traumatic stress disorder, unspecified: Secondary | ICD-10-CM

## 2021-04-13 DIAGNOSIS — F331 Major depressive disorder, recurrent, moderate: Secondary | ICD-10-CM | POA: Diagnosis not present

## 2021-04-13 DIAGNOSIS — F411 Generalized anxiety disorder: Secondary | ICD-10-CM

## 2021-04-17 ENCOUNTER — Ambulatory Visit (INDEPENDENT_AMBULATORY_CARE_PROVIDER_SITE_OTHER): Payer: No Typology Code available for payment source | Admitting: Licensed Clinical Social Worker

## 2021-04-17 ENCOUNTER — Encounter (HOSPITAL_COMMUNITY): Payer: Self-pay | Admitting: Licensed Clinical Social Worker

## 2021-04-17 ENCOUNTER — Other Ambulatory Visit: Payer: Self-pay

## 2021-04-17 DIAGNOSIS — F331 Major depressive disorder, recurrent, moderate: Secondary | ICD-10-CM

## 2021-04-17 DIAGNOSIS — F411 Generalized anxiety disorder: Secondary | ICD-10-CM | POA: Diagnosis not present

## 2021-04-17 DIAGNOSIS — F4310 Post-traumatic stress disorder, unspecified: Secondary | ICD-10-CM | POA: Diagnosis not present

## 2021-04-17 NOTE — Progress Notes (Signed)
Virtual Visit via Phone Note ? ?I connected with Mark [name] on 04/13/21 at 2:00pm EST by phone-enabled telemedicine with the correct person using two identifiers. ?  ?I discussed the limitations of evaluation and management by telemedicine and the availability of in person appointments. The patient expressed understanding and agreed to proceed. ? ?LOCATION: ?Patient: home ?Provider: home office ? ? ? ?History of Present Illness: Pt was referred to [facility] OP therapy for PTSD, depression and anxiety by the [facility]. ? ?Treatment Goal Addressed:   Pt will reduce irritability and increase normal social interaction with family and friends and will develop the ability to recognize, accept, and cope with feelings of depression by using practiced coping skills for depression 1-2x daily AEB self report. ? ?Progress towards Goal: ? Progressing ?   ?Observations/Objective: Patient presented for today?s session on time and was alert, oriented x5, with no evidence or self-report of SI/HI or A/V H.  Patient reported ongoing compliance with medication. Clinician inquired about patient?s current emotional ratings, as well as any significant changes in thoughts, feelings or behavior since previous session. Patient reported emotional rating scores of 8/10 for depression, 7/10 for anxiety, 5/10 for anger/irritability. Cln and pt explored his emotional ratings and coping skills. Pt reports, "my depressive symptoms have increased and my anxiety symptoms have decreased." Again, Cln and pt explored his increase in depressive symptoms: being homebound without wheelchair for months and unable to have normal social interaction, and difficulty with self care. "My wife is coming for the weekend to have the car inspected." Clinician utilized [place] O[place] to affirm his concerns about the upcoming trip. Cln and pt reviewed coping skills for possible upcoming stressors. ? ? ? ?Collaboration of Care: No collaboration needed at this session. ?   ?Patient/Guardian was advised Release of Information must be obtained prior to any record release in order to collaborate their care with an outside provider. Patient/Guardian was advised if they have not already done so to contact the registration department to sign all necessary forms in order for [name] to release information regarding their care.  ?  ?Consent: Patient/Guardian gives verbal consent for treatment and assignment of benefits for services provided during this visit. Patient/Guardian expressed understanding and agreed to proceed.   ? ? ?Assessment and Plan: Counselor will continue to meet with patient to address treatment plan goals. Patient will continue to follow recommendations of providers and implement skills learned in session. ?  ?Follow Up Instructions I discussed the assessment and treatment plan with the patient. The patient was provided an opportunity to ask questions and all were answered. The patient agreed with the plan and demonstrated an understanding of the instructions. ?  ?The patient was advised to call back or seek an in-person evaluation if the symptoms worsen or if the condition fails to improve as anticipated. ? ?I provided 45 minutes of non-face-to-face time during this encounter. ? ? ?Averly Ericson S, LCAS ? ?. ? ? ?

## 2021-04-18 ENCOUNTER — Encounter (HOSPITAL_COMMUNITY): Payer: Self-pay | Admitting: Licensed Clinical Social Worker

## 2021-04-18 NOTE — Progress Notes (Signed)
Virtual Visit via Video Note ? ?I connected with Mark [name] on 04/10/21 at 5:00 pm EST by a video enabled telemedicine application and verified that I am speaking with the correct person using two identifiers. ?  ?I discussed the limitations of evaluation and management by telemedicine and the availability of in person appointments. The patient expressed understanding and agreed to proceed. ? ?LOCATION: ?Patient: Home ?Provider: home Office ? ?History of Present Illness: [facility], [facility], [facility]. ? ?Treatment Goal Addressed: ? Pt will develop the ability to recognize, accept, and cope with feelings of depression AEB self report. ? ?Progress towards Goal:  ?Progressing ? ? ? ?Observation/Objective:   Patient participated in a discussion on sense of self-worth, valuing self, and having a sense of feeling worthy, especially when experiencing depressive symptoms. Patient was encouraged to acknowledge the inner critic and practice positive affirmations.  ? ?Assessment and Plan: Counselor will continue to meet with patient address treatment plan goals. Patient was given a chance for recommendations of providers and implement skills learned in session. ? ? ? Follow-up instructions:I discussed the assessment and treatment plan with the patient. Patient was provided an opportunity to ask questions and all were answered. The patient agreed with the plan and demonstrated an understanding of the instructions. ?  ?The patient was advised to call back or seek an in-person evaluation if the symptoms worsen or if the condition fails to improve as anticipated. ? ?Collaboration of Care: Other No collaboration of care was used at this session. ? ?Patient/Guardian was advised Release of Information must be obtained prior to any record release in order to collaborate their care with an outside provider. Patient/Guardian was advised if they have not already done so to contact  the registration department to sign all necessary forms in order for [name] to release information regarding their care.  ? ?Consent: Patient/Guardian gives verbal consent for treatment and assignment of benefits for services provided during this visit. Patient/Guardian expressed understanding and agreed to proceed.  ? ? ? ? ?I provided 90 minutes of non-face-to-face time during this encounter. ? ? ?Hulen Mandler S, LCAS ? ? ? ?

## 2021-04-20 ENCOUNTER — Ambulatory Visit (INDEPENDENT_AMBULATORY_CARE_PROVIDER_SITE_OTHER): Payer: No Typology Code available for payment source | Admitting: Licensed Clinical Social Worker

## 2021-04-20 DIAGNOSIS — F4310 Post-traumatic stress disorder, unspecified: Secondary | ICD-10-CM

## 2021-04-20 DIAGNOSIS — F411 Generalized anxiety disorder: Secondary | ICD-10-CM | POA: Diagnosis not present

## 2021-04-20 DIAGNOSIS — F331 Major depressive disorder, recurrent, moderate: Secondary | ICD-10-CM

## 2021-04-24 ENCOUNTER — Encounter (HOSPITAL_COMMUNITY): Payer: Self-pay | Admitting: Licensed Clinical Social Worker

## 2021-04-24 ENCOUNTER — Ambulatory Visit (INDEPENDENT_AMBULATORY_CARE_PROVIDER_SITE_OTHER): Payer: No Typology Code available for payment source | Admitting: Licensed Clinical Social Worker

## 2021-04-24 DIAGNOSIS — F331 Major depressive disorder, recurrent, moderate: Secondary | ICD-10-CM

## 2021-04-24 DIAGNOSIS — F411 Generalized anxiety disorder: Secondary | ICD-10-CM

## 2021-04-24 DIAGNOSIS — F4310 Post-traumatic stress disorder, unspecified: Secondary | ICD-10-CM

## 2021-04-24 NOTE — Progress Notes (Signed)
Virtual Visit via Phone Note ? ?I connected with Mark [name] on 04/20/21 at 2:00pm EST by phone-enabled telemedicine with the correct person using two identifiers. ?  ?I discussed the limitations of evaluation and management by telemedicine and the availability of in person appointments. The patient expressed understanding and agreed to proceed. ? ?LOCATION: ?Patient: home ?Provider: home office ? ? ? ?History of Present Illness: Pt was referred to [facility] OP therapy for PTSD, depression and anxiety by the [facility]. ? ?Treatment Goal Addressed:   Pt will reduce irritability and increase normal social interaction with family and friends and will develop the ability to recognize, accept, and cope with feelings of depression by using practiced coping skills for depression 1-2x daily AEB self report. ? ?Progress towards Goal: ? Progressing ?   ?Observations/Objective: Patient presented for today?s session on time and was alert, oriented x5, with no evidence or self-report of SI/HI or A/V H.  Patient reported ongoing compliance with medication. Clinician inquired about patient?s current emotional ratings, as well as any significant changes in thoughts, feelings or behavior since previous session. Patient reported emotional rating scores of 8/10 for depression, 8/10 for anxiety, 5/10 for anger/irritability. Cln and pt explored his emotional ratings and coping skills. Pt reports, "my depressive symptoms have increased and my anxiety symptoms have increased again and I'm becoming more agitated." Cln explored with pt: low frustration tolerance Cln provided education on skill building LFT: frustrating scenario, manage self talk, and use healthy coping skills to deal with current feelings. Pt practiced in session. ? ?Collaboration of Care: No collaboration needed at this session. ?  ?Patient/Guardian was advised Release of Information must be obtained prior to any record release in order to collaborate their care with an  outside provider. Patient/Guardian was advised if they have not already done so to contact the registration department to sign all necessary forms in order for [name] to release information regarding their care.  ?  ?Consent: Patient/Guardian gives verbal consent for treatment and assignment of benefits for services provided during this visit. Patient/Guardian expressed understanding and agreed to proceed.   ? ? ?Assessment and Plan: Counselor will continue to meet with patient to address treatment plan goals. Patient will continue to follow recommendations of providers and implement skills learned in session. ?  ?Follow Up Instructions I discussed the assessment and treatment plan with the patient. The patient was provided an opportunity to ask questions and all were answered. The patient agreed with the plan and demonstrated an understanding of the instructions. ?  ?The patient was advised to call back or seek an in-person evaluation if the symptoms worsen or if the condition fails to improve as anticipated. ? ?I provided 45 minutes of non-face-to-face time during this encounter. ? ? ?Shlomo Seres S, LCAS ? ?. ? ? ?

## 2021-04-25 ENCOUNTER — Encounter (HOSPITAL_COMMUNITY): Payer: Self-pay | Admitting: Licensed Clinical Social Worker

## 2021-04-25 NOTE — Progress Notes (Signed)
Virtual Visit via Video Note ? ?I connected with Mark [name] on 04/24/2021 at 5:00 pm EST by a video enabled telemedicine application and verified that I am speaking with the correct person using two identifiers. ?  ?I discussed the limitations of evaluation and management by telemedicine and the availability of in person appointments. The patient expressed understanding and agreed to proceed. ? ?LOCATION: ?Patient: Home ?Provider: home Office ? ?History of Present Illness: [facility], [facility]. ? ?Treatment Goal Addressed: ? Pt will develop the ability to recognize, accept, and cope with feelings of depression AEB self report. ? ?Progress towards Goal:  ?Progressing ? ? ? ?Observation/Objective:   Patient participated in a discussion on emotional regulation skills, helping patient learn to manage his feelings and to better cope with situations.  Patient was encouraged to apply emotional regulation skills and focus on the positive. ? ? ? ? ?Assessment and Plan: Counselor will continue to meet with patient address treatment plan goals. Patient was given a chance for recommendations of providers and implement skills learned in session. ? ? ? Follow-up instructions:I discussed the assessment and treatment plan with the patient. Patient was provided an opportunity to ask questions and all were answered. The patient agreed with the plan and demonstrated an understanding of the instructions. ?  ?The patient was advised to call back or seek an in-person evaluation if the symptoms worsen or if the condition fails to improve as anticipated. ? ?Collaboration of Care: Other No collaboration of care was used at this session. ? ?Patient/Guardian was advised Release of Information must be obtained prior to any record release in order to collaborate their care with an outside provider. Patient/Guardian was advised if they have not already done so to contact the  registration department to sign all necessary forms in order for [name] to release information regarding their care.  ? ?Consent: Patient/Guardian gives verbal consent for treatment and assignment of benefits for services provided during this visit. Patient/Guardian expressed understanding and agreed to proceed.  ? ? ? ? ?I provided 75 minutes of non-face-to-face time during this encounter. ? ? ?Marvin Grabill S, LCAS ? ? ? ?

## 2021-04-27 ENCOUNTER — Ambulatory Visit (INDEPENDENT_AMBULATORY_CARE_PROVIDER_SITE_OTHER): Payer: No Typology Code available for payment source | Admitting: Licensed Clinical Social Worker

## 2021-04-27 ENCOUNTER — Telehealth (INDEPENDENT_AMBULATORY_CARE_PROVIDER_SITE_OTHER): Payer: No Typology Code available for payment source | Admitting: Psychiatry

## 2021-04-27 ENCOUNTER — Encounter (HOSPITAL_COMMUNITY): Payer: Self-pay | Admitting: Psychiatry

## 2021-04-27 DIAGNOSIS — R4589 Other symptoms and signs involving emotional state: Secondary | ICD-10-CM | POA: Diagnosis not present

## 2021-04-27 DIAGNOSIS — F331 Major depressive disorder, recurrent, moderate: Secondary | ICD-10-CM | POA: Diagnosis not present

## 2021-04-27 DIAGNOSIS — F411 Generalized anxiety disorder: Secondary | ICD-10-CM

## 2021-04-27 DIAGNOSIS — F419 Anxiety disorder, unspecified: Secondary | ICD-10-CM

## 2021-04-27 DIAGNOSIS — F4310 Post-traumatic stress disorder, unspecified: Secondary | ICD-10-CM

## 2021-04-27 MED ORDER — BUPROPION HCL ER (XL) 150 MG PO TB24: 150.0000 mg | ORAL_TABLET | Freq: Every day | ORAL | 0 refills | Status: AC
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
## 2021-05-01 ENCOUNTER — Ambulatory Visit (INDEPENDENT_AMBULATORY_CARE_PROVIDER_SITE_OTHER): Payer: No Typology Code available for payment source | Admitting: Licensed Clinical Social Worker

## 2021-05-01 ENCOUNTER — Encounter (HOSPITAL_COMMUNITY): Payer: Self-pay | Admitting: Licensed Clinical Social Worker

## 2021-05-01 DIAGNOSIS — F4310 Post-traumatic stress disorder, unspecified: Secondary | ICD-10-CM

## 2021-05-01 DIAGNOSIS — F419 Anxiety disorder, unspecified: Secondary | ICD-10-CM

## 2021-05-01 DIAGNOSIS — F331 Major depressive disorder, recurrent, moderate: Secondary | ICD-10-CM | POA: Diagnosis not present

## 2021-05-01 NOTE — Progress Notes (Signed)
Virtual Visit via Video Note ? ?I connected with Mark [name] on 05/01/2021 at 5:00 pm EST by a video enabled telemedicine application and verified that I am speaking with the correct person using two identifiers. ?  ?I discussed the limitations of evaluation and management by telemedicine and the availability of in person appointments. The patient expressed understanding and agreed to proceed. ? ?LOCATION: ?Patient: Home ?Provider: home Office ? ?History of Present Illness: [facility], [facility], [facility]. ? ?Treatment Goal Addressed: ? Pt will develop the ability to recognize, accept, and cope with feelings of depression AEB self report. ? ?Progress towards Goal:  ?Progressing ? ? ? ?Observation/Objective:   Patient participated in a discussion on introspection, looking inward, self-examination, reflection, and inspection of thoughts. Patient described self-reflection to describe his reflective thoughts. Patient was encouraged to continue using introspection. ? ? ? ? ?Assessment and Plan: Counselor will continue to meet with patient address treatment plan goals. Patient was given a chance for recommendations of providers and implement skills learned in session. ? ? ? Follow-up instructions:I discussed the assessment and treatment plan with the patient. Patient was provided an opportunity to ask questions and all were answered. The patient agreed with the plan and demonstrated an understanding of the instructions. ?  ?The patient was advised to call back or seek an in-person evaluation if the symptoms worsen or if the condition fails to improve as anticipated. ? ?Collaboration of Care: Other No collaboration of care was used at this session. ? ?Patient/Guardian was advised Release of Information must be obtained prior to any record release in order to collaborate their care with an outside provider. Patient/Guardian was advised if they have not already done so to  contact the registration department to sign all necessary forms in order for [name] to release information regarding their care.  ? ?Consent: Patient/Guardian gives verbal consent for treatment and assignment of benefits for services provided during this visit. Patient/Guardian expressed understanding and agreed to proceed.  ? ? ? ? ?I provided 75 minutes of non-face-to-face time during this encounter. ? ? ?Mark [name] S, LCAS ? ? ? ?

## 2021-05-01 NOTE — Progress Notes (Signed)
Virtual Visit via Phone Note ? ?I connected with Mark [name] on 04/27/21 at 2:00pm EST by phone-enabled telemedicine with the correct person using two identifiers. ?  ?I discussed the limitations of evaluation and management by telemedicine and the availability of in person appointments. The patient expressed understanding and agreed to proceed. ? ?LOCATION: ?Patient: home ?Provider: home office ? ? ? ?History of Present Illness: Pt was referred to [facility] OP therapy for PTSD, depression and anxiety by the [facility]. ? ?Treatment Goal Addressed:   Pt will reduce irritability and increase normal social interaction with family and friends and will develop the ability to recognize, accept, and cope with feelings of depression by using practiced coping skills for depression 1-2x daily AEB self report. ? ?Progress towards Goal: ? Progressing ?   ?Observations/Objective: Patient presented for today?s session on time and was alert, oriented x5, with no evidence or self-report of SI/HI or A/V H.  Patient reported ongoing compliance with medication. Clinician inquired about patient?s current emotional ratings, as well as any significant changes in thoughts, feelings or behavior since previous session. Patient reported emotional rating scores of 8/10 for depression, 8/10 for anxiety, 5/10 for anger/irritability. Cln and pt explored his emotional ratings and coping skills. Pt reports, "I'm tired, my emotional ratings are high. I'm still depressed, agitated and anxious." Cln asked open-ended questions. "I can't pinpoint what is making my emotional ratings change." Cln, again, discussed, low frustration tolerance and 4 ways to increase frustration tolerance. Cln and pt role-played. ? ? ? ? ?Collaboration of Care: Other: Cln suggested to follow up with psychiatrist, Dr. Alta Corning. ?  ?Patient/Guardian was advised Release of Information must be obtained prior to any record release in order to collaborate their care with an outside  provider. Patient/Guardian was advised if they have not already done so to contact the registration department to sign all necessary forms in order for [name] to release information regarding their care.  ?  ?Consent: Patient/Guardian gives verbal consent for treatment and assignment of benefits for services provided during this visit. Patient/Guardian expressed understanding and agreed to proceed.   ? ? ?Assessment and Plan: Counselor will continue to meet with patient to address treatment plan goals. Patient will continue to follow recommendations of providers and implement skills learned in session. ?  ?Follow Up Instructions I discussed the assessment and treatment plan with the patient. The patient was provided an opportunity to ask questions and all were answered. The patient agreed with the plan and demonstrated an understanding of the instructions. ?  ?The patient was advised to call back or seek an in-person evaluation if the symptoms worsen or if the condition fails to improve as anticipated. ? ?I provided 45 minutes of non-face-to-face time during this encounter. ? ? ?Mark [name] S, LCAS ? ?. ? ? ?

## 2021-05-04 ENCOUNTER — Ambulatory Visit (INDEPENDENT_AMBULATORY_CARE_PROVIDER_SITE_OTHER): Payer: No Typology Code available for payment source | Admitting: Licensed Clinical Social Worker

## 2021-05-04 ENCOUNTER — Encounter (HOSPITAL_COMMUNITY): Payer: Self-pay | Admitting: Licensed Clinical Social Worker

## 2021-05-04 DIAGNOSIS — F4310 Post-traumatic stress disorder, unspecified: Secondary | ICD-10-CM

## 2021-05-04 DIAGNOSIS — F331 Major depressive disorder, recurrent, moderate: Secondary | ICD-10-CM

## 2021-05-04 DIAGNOSIS — F419 Anxiety disorder, unspecified: Secondary | ICD-10-CM | POA: Diagnosis not present

## 2021-05-08 ENCOUNTER — Ambulatory Visit (INDEPENDENT_AMBULATORY_CARE_PROVIDER_SITE_OTHER): Payer: No Typology Code available for payment source | Admitting: Licensed Clinical Social Worker

## 2021-05-08 ENCOUNTER — Encounter (HOSPITAL_COMMUNITY): Payer: Self-pay | Admitting: Licensed Clinical Social Worker

## 2021-05-08 DIAGNOSIS — F4310 Post-traumatic stress disorder, unspecified: Secondary | ICD-10-CM

## 2021-05-08 DIAGNOSIS — F419 Anxiety disorder, unspecified: Secondary | ICD-10-CM | POA: Diagnosis not present

## 2021-05-08 DIAGNOSIS — F331 Major depressive disorder, recurrent, moderate: Secondary | ICD-10-CM | POA: Diagnosis not present

## 2021-05-08 NOTE — Progress Notes (Signed)
Virtual Visit via Video Note ? ?I connected with Mark [name] on 05/08/2021 at 5:00 pm EST by a video enabled telemedicine application and verified that I am speaking with the correct person using two identifiers. ?  ?I discussed the limitations of evaluation and management by telemedicine and the availability of in person appointments. The patient expressed understanding and agreed to proceed. ? ?LOCATION: ?Patient: Home ?Provider: home Office ? ?History of Present Illness: [facility], [facility], [facility]. ? ?Treatment Goal Addressed: ? Pt will develop the ability to recognize, accept, and cope with feelings of depression AEB self report. ? ?Progress towards Goal:  ?Progressing ? ? ? ?Observation/Objective:   Patient participated in a discussion on dealing with negative emotions in a healthy way.  Everyone can do a better job of dealing with their negative emotions in healthy way. Patient was encouraged to use PATH: Pause, Acknowledge, Think, Help. ? ? ?Assessment and Plan: Counselor will continue to meet with patient address treatment plan goals. Patient was given a chance for recommendations of providers and implement skills learned in session. ? ? ? Follow-up instructions:I discussed the assessment and treatment plan with the patient. Patient was provided an opportunity to ask questions and all were answered. The patient agreed with the plan and demonstrated an understanding of the instructions. ?  ?The patient was advised to call back or seek an in-person evaluation if the symptoms worsen or if the condition fails to improve as anticipated. ? ?Collaboration of Care: Other No collaboration of care was needed at this session. ? ?Patient/Guardian was advised Release of Information must be obtained prior to any record release in order to collaborate their care with an outside provider. Patient/Guardian was advised if they have not already done so to contact the  registration department to sign all necessary forms in order for [name] to release information regarding their care.  ? ?Consent: Patient/Guardian gives verbal consent for treatment and assignment of benefits for services provided during this visit. Patient/Guardian expressed understanding and agreed to proceed.  ? ? ? ? ?I provided 75 minutes of non-face-to-face time during this encounter. ? ? ?Mark [name] S, LCAS ? ? ? ?

## 2021-05-08 NOTE — Progress Notes (Signed)
Virtual Visit via Phone Note ? ?I connected with Mark [name] on 05/04/21 at 2:00pm EST by phone-enabled telemedicine with the correct person using two identifiers. ?  ?I discussed the limitations of evaluation and management by telemedicine and the availability of in person appointments. The patient expressed understanding and agreed to proceed. ? ?LOCATION: ?Patient: home ?Provider: home office ? ? ? ?History of Present Illness: Pt was referred to [facility] OP therapy for PTSD, depression and anxiety by the [facility]. ? ?Treatment Goal Addressed:   Pt will reduce irritability and increase normal social interaction with family and friends and will develop the ability to recognize, accept, and cope with feelings of depression by using practiced coping skills for depression 1-2x daily AEB self report. ? ?Progress towards Goal: ? Progressing ?   ?Observations/Objective: Patient presented for today?s session on time and was alert, oriented x5, with no evidence or self-report of SI/HI or A/V H.  Patient reported ongoing compliance with medication. Clinician inquired about patient?s current emotional ratings, as well as any significant changes in thoughts, feelings or behavior since previous session. Patient reported emotional rating scores of 8/10 for depression, 8/10 for anxiety, 5/10 for anger/irritability. Cln and pt reviewed emotional ratings and coping skills. Pt reports, "I continue to be tired, my emotional ratings are high. I continue to be depressed, agitated and anxious." Cln asked open-ended questions. "I met with psychiatry at [facility] to  start taking courses there virtually: anger management, budgeting, stress management..." Clinician utilized CBT to process thoughts, feelings, and behaviors. Pt continues to increase his low frustration tolerance. ? ?Collaboration of Care: Other: None needed at this session. ?  ?Patient/Guardian was advised Release of Information must be obtained prior to any record release in  order to collaborate their care with an outside provider. Patient/Guardian was advised if they have not already done so to contact the registration department to sign all necessary forms in order for [name] to release information regarding their care.  ?  ?Consent: Patient/Guardian gives verbal consent for treatment and assignment of benefits for services provided during this visit. Patient/Guardian expressed understanding and agreed to proceed.   ? ? ?Assessment and Plan: Counselor will continue to meet with patient to address treatment plan goals. Patient will continue to follow recommendations of providers and implement skills learned in session. ?  ?Follow Up Instructions I discussed the assessment and treatment plan with the patient. The patient was provided an opportunity to ask questions and all were answered. The patient agreed with the plan and demonstrated an understanding of the instructions. ?  ?The patient was advised to call back or seek an in-person evaluation if the symptoms worsen or if the condition fails to improve as anticipated. ? ?I provided 45 minutes of non-face-to-face time during this encounter. ? ? ?Emanual Lamountain S, LCAS ? ?. ? ? ?

## 2021-05-11 ENCOUNTER — Ambulatory Visit (INDEPENDENT_AMBULATORY_CARE_PROVIDER_SITE_OTHER): Payer: Medicare HMO | Admitting: Licensed Clinical Social Worker

## 2021-05-11 ENCOUNTER — Encounter (HOSPITAL_COMMUNITY): Payer: Self-pay | Admitting: Licensed Clinical Social Worker

## 2021-05-11 DIAGNOSIS — F331 Major depressive disorder, recurrent, moderate: Secondary | ICD-10-CM | POA: Diagnosis not present

## 2021-05-11 DIAGNOSIS — F4310 Post-traumatic stress disorder, unspecified: Secondary | ICD-10-CM

## 2021-05-11 DIAGNOSIS — F419 Anxiety disorder, unspecified: Secondary | ICD-10-CM
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
## 2021-05-15 ENCOUNTER — Encounter (HOSPITAL_COMMUNITY): Payer: Self-pay | Admitting: Licensed Clinical Social Worker

## 2021-05-15 ENCOUNTER — Ambulatory Visit (INDEPENDENT_AMBULATORY_CARE_PROVIDER_SITE_OTHER): Payer: No Typology Code available for payment source | Admitting: Licensed Clinical Social Worker

## 2021-05-15 DIAGNOSIS — F4310 Post-traumatic stress disorder, unspecified: Secondary | ICD-10-CM

## 2021-05-15 DIAGNOSIS — F331 Major depressive disorder, recurrent, moderate: Secondary | ICD-10-CM

## 2021-05-15 DIAGNOSIS — F419 Anxiety disorder, unspecified: Secondary | ICD-10-CM

## 2021-05-15 NOTE — Progress Notes (Signed)
Virtual Visit via Video Note ? ?I connected with Mark [name] on 05/15/2021 at 5:00 pm EST by a video enabled telemedicine application and verified that I am speaking with the correct person using two identifiers. ?  ?I discussed the limitations of evaluation and management by telemedicine and the availability of in person appointments. The patient expressed understanding and agreed to proceed. ? ?LOCATION: ?Patient: Home ?Provider: home Office ? ?History of Present Illness: [facility], [facility]. ? ?Treatment Goal Addressed: ? Pt will develop the ability to recognize, accept, and cope with feelings of depression AEB self report. ? ?Progress towards Goal:  ?Progressing ? ? ? ?Observation/Objective:   Patient participated in a discussion on feeling emotionally overwhelmed, which has increased his depressive symptoms. Patient was encouraged to use coping skills, soothing activities and grounding to help with self-soothing. Patient was encouraged to use the self-soothing activities at home between sessions when feeling overwhelmed emotionally. ? ? ?Assessment and Plan: Counselor will continue to meet with patient address treatment plan goals. Patient was given a chance for recommendations of providers and implement skills learned in session. ? ? ? Follow-up instructions:I discussed the assessment and treatment plan with the patient. Patient was provided an opportunity to ask questions and all were answered. The patient agreed with the plan and demonstrated an understanding of the instructions. ?  ?The patient was advised to call back or seek an in-person evaluation if the symptoms worsen or if the condition fails to improve as anticipated. ? ?Collaboration of Care: Other No collaboration of care was needed at this session. ? ?Patient/Guardian was advised Release of Information must be obtained prior to any record release in order to collaborate their care  with an outside provider. Patient/Guardian was advised if they have not already done so to contact the registration department to sign all necessary forms in order for [name] to release information regarding their care.  ? ?Consent: Patient/Guardian gives verbal consent for treatment and assignment of benefits for services provided during this visit. Patient/Guardian expressed understanding and agreed to proceed.  ? ? ? ? ?I provided 75 minutes of non-face-to-face time during this encounter. ? ? ?Michaela Shankel S, LCAS ? ? ? ?

## 2021-05-18 ENCOUNTER — Ambulatory Visit (INDEPENDENT_AMBULATORY_CARE_PROVIDER_SITE_OTHER): Payer: Medicare HMO | Admitting: Licensed Clinical Social Worker

## 2021-05-18 DIAGNOSIS — F419 Anxiety disorder, unspecified: Secondary | ICD-10-CM | POA: Diagnosis not present

## 2021-05-18 DIAGNOSIS — F4310 Post-traumatic stress disorder, unspecified: Secondary | ICD-10-CM

## 2021-05-18 DIAGNOSIS — F331 Major depressive disorder, recurrent, moderate: Secondary | ICD-10-CM

## 2021-05-22 ENCOUNTER — Ambulatory Visit (INDEPENDENT_AMBULATORY_CARE_PROVIDER_SITE_OTHER): Payer: No Typology Code available for payment source | Admitting: Licensed Clinical Social Worker

## 2021-05-22 DIAGNOSIS — F419 Anxiety disorder, unspecified: Secondary | ICD-10-CM | POA: Diagnosis not present

## 2021-05-22 DIAGNOSIS — F331 Major depressive disorder, recurrent, moderate: Secondary | ICD-10-CM | POA: Diagnosis not present

## 2021-05-22 DIAGNOSIS — F4310 Post-traumatic stress disorder, unspecified: Secondary | ICD-10-CM

## 2021-05-24 ENCOUNTER — Encounter (HOSPITAL_COMMUNITY): Payer: Self-pay | Admitting: Licensed Clinical Social Worker

## 2021-05-24 NOTE — Progress Notes (Signed)
Virtual Visit via Video Note ? ?I connected with Mark [name] on 05/18/21 at 2:00pm EST by video-enabled telemedicine with the correct person using two identifiers. ?  ?I discussed the limitations of evaluation and management by telemedicine and the availability of in person appointments. The patient expressed understanding and agreed to proceed. ? ?LOCATION: ?Patient: home ?Provider: home office ? ? ? ?History of Present Illness: Pt was referred to [facility] OP therapy for PTSD, depression and anxiety by the [facility]. ? ?Treatment Goal Addressed:   Pt will reduce irritability and increase normal social interaction with family and friends and will develop the ability to recognize, accept, and cope with feelings of depression by using practiced coping skills for depression 1-2x daily AEB self report. ? ?Progress towards Goal: ? Progressing ?   ?Observations/Objective: Patient presented for today?s session on time and was alert, oriented x5, with no evidence or self-report of SI/HI or A/V H.  Patient reported ongoing compliance with medication. Clinician inquired about patient?s current emotional ratings, as well as any significant changes in thoughts, feelings or behavior since previous session. Patient reported emotional rating scores of  8/10 for depression, 8/10 for anxiety, 5/10 for anger/irritability. Cln and pt reviewed emotional ratings and coping skills. Pt reports, "My wheelchair was delivered this morning, it's been since Christmas. I'm once again hopeful for my future. I have plans to get out of the house this weekend." Pt provided an update: family, low frustration tolerance, health, finances, depressive symptoms, anxiety. Cln and pt reviewed coping skills that work: diversion. Cln and pt reviewed "Cycle of Depression" putting it in terms of his depression cycle.  ? ? ? ?PLAN: [facility] reauthor[facility], [employer], classes at the [place] ? ? ? ? ?Collaboration of Care: Other: [place] for reauthorization  and [employer] questions ?  ?Patient/Guardian was advised Release of Information must be obtained prior to any record release in order to collaborate their care with an outside provider. Patient/Guardian was advised if they have not already done so to contact the registration department to sign all necessary forms in order for [name] to release information regarding their care.  ?  ?Consent: Patient/Guardian gives verbal consent for treatment and assignment of benefits for services provided during this visit. Patient/Guardian expressed understanding and agreed to proceed.   ? ? ?Assessment and Plan: Counselor will continue to meet with patient to address treatment plan goals. Patient will continue to follow recommendations of providers and implement skills learned in session. ?  ?Follow Up Instructions I discussed the assessment and treatment plan with the patient. The patient was provided an opportunity to ask questions and all were answered. The patient agreed with the plan and demonstrated an understanding of the instructions. ?  ?The patient was advised to call back or seek an in-person evaluation if the symptoms worsen or if the condition fails to improve as anticipated. ? ?I provided 45 minutes of non-face-to-face time during this encounter. ? ? ?Rice Walsh S, LCAS ? ?. ? ? ?

## 2021-05-25 ENCOUNTER — Ambulatory Visit (INDEPENDENT_AMBULATORY_CARE_PROVIDER_SITE_OTHER): Payer: Medicare HMO | Admitting: Licensed Clinical Social Worker

## 2021-05-25 DIAGNOSIS — F331 Major depressive disorder, recurrent, moderate: Secondary | ICD-10-CM

## 2021-05-25 DIAGNOSIS — F419 Anxiety disorder, unspecified: Secondary | ICD-10-CM

## 2021-05-25 DIAGNOSIS — F4310 Post-traumatic stress disorder, unspecified: Secondary | ICD-10-CM | POA: Diagnosis not present

## 2021-05-29 ENCOUNTER — Encounter (HOSPITAL_COMMUNITY): Payer: Self-pay | Admitting: Licensed Clinical Social Worker

## 2021-05-29 ENCOUNTER — Ambulatory Visit (INDEPENDENT_AMBULATORY_CARE_PROVIDER_SITE_OTHER): Payer: No Typology Code available for payment source | Admitting: Licensed Clinical Social Worker

## 2021-05-29 DIAGNOSIS — F419 Anxiety disorder, unspecified: Secondary | ICD-10-CM | POA: Diagnosis not present

## 2021-05-29 DIAGNOSIS — F4310 Post-traumatic stress disorder, unspecified: Secondary | ICD-10-CM | POA: Diagnosis not present

## 2021-05-29 DIAGNOSIS — F331 Major depressive disorder, recurrent, moderate: Secondary | ICD-10-CM

## 2021-05-29 NOTE — Progress Notes (Signed)
Virtual Visit via Video Note ? ?I connected with Mark [name] on 05/22/2021 at 5:00 pm EST by a video enabled telemedicine application and verified that I am speaking with the correct person using two identifiers. ?  ?I discussed the limitations of evaluation and management by telemedicine and the availability of in person appointments. The patient expressed understanding and agreed to proceed. ? ?LOCATION: ?Patient: Home ?Provider: home Office ? ?History of Present Illness: [facility], [facility], [facility]. ? ?Treatment Goal Addressed: ? Pt will develop the ability to recognize, accept, and cope with feelings of depression AEB self report. ? ?Progress towards Goal:  ?Progressing ? ? ? ?Observation/Objective:   Patient participated in a discussion on cognitive dissonance. Patient discussed his conflicting attitudes, beliefs and behaviors. Clinician suggested the benefit in changing his attitudes, beliefs and behaviors to reduce the discomfort and restore balance in life. ? ? ? ?Assessment and Plan: Counselor will continue to meet with patient address treatment plan goals. Patient was given a chance for recommendations of providers and implement skills learned in session. ? ? ? Follow-up instructions:I discussed the assessment and treatment plan with the patient. Patient was provided an opportunity to ask questions and all were answered. The patient agreed with the plan and demonstrated an understanding of the instructions. ?  ?The patient was advised to call back or seek an in-person evaluation if the symptoms worsen or if the condition fails to improve as anticipated. ? ?Collaboration of Care: Other No collaboration of care was needed at this session. ? ?Patient/Guardian was advised Release of Information must be obtained prior to any record release in order to collaborate their care with an outside provider. Patient/Guardian was advised if they have not already  done so to contact the registration department to sign all necessary forms in order for [name] to release information regarding their care.  ? ?Consent: Patient/Guardian gives verbal consent for treatment and assignment of benefits for services provided during this visit. Patient/Guardian expressed understanding and agreed to proceed.  ? ? ? ? ?I provided 75 minutes of non-face-to-face time during this encounter. ? ? ?Syd Newsome S, LCAS ? ? ? ?

## 2021-05-30 ENCOUNTER — Encounter (HOSPITAL_COMMUNITY): Payer: Self-pay | Admitting: Licensed Clinical Social Worker

## 2021-05-30 NOTE — Progress Notes (Signed)
Virtual Visit via Video Note ? ?I connected with Mark [name] on 05/29/2021 at 5:00 pm EST by a video enabled telemedicine application and verified that I am speaking with the correct person using two identifiers. ?  ?I discussed the limitations of evaluation and management by telemedicine and the availability of in person appointments. The patient expressed understanding and agreed to proceed. ? ?LOCATION: ?Patient: Home ?Provider: home Office ? ?History of Present Illness: [facility], [facility], [facility]. ? ?Treatment Goal Addressed: ? Pt will develop the ability to recognize, accept, and cope with feelings of depression AEB self report. ? ?Progress towards Goal:  ?Progressing ? ? ? ?Observation/Objective:    Patient participated in a discussion on boundaries, a definition, thoughts and ?appropriate? roles in boundaries. Cln discussed the types of boundaries are fixed or permeable, implications in the language of boundaries and setting personal limits. Patient discussed the effects of his use of boundaries in personal relationships. Patient was encouraged to continue to use boundaries and setting limits within family relationships. ? ? ?Assessment and Plan: Counselor will continue to meet with patient address treatment plan goals. Patient was given a chance for recommendations of providers and implement skills learned in session. ? ? ? Follow-up instructions:I discussed the assessment and treatment plan with the patient. Patient was provided an opportunity to ask questions and all were answered. The patient agreed with the plan and demonstrated an understanding of the instructions. ?  ?The patient was advised to call back or seek an in-person evaluation if the symptoms worsen or if the condition fails to improve as anticipated. ? ?Collaboration of Care: Other No collaboration of care was needed at this session. ? ?Patient/Guardian was advised Release of  Information must be obtained prior to any record release in order to collaborate their care with an outside provider. Patient/Guardian was advised if they have not already done so to contact the registration department to sign all necessary forms in order for [name] to release information regarding their care.  ? ?Consent: Patient/Guardian gives verbal consent for treatment and assignment of benefits for services provided during this visit. Patient/Guardian expressed understanding and agreed to proceed.  ? ? ? ? ?I provided 120 minutes of non-face-to-face time during this encounter. ? ? ?Kevan Prouty S, LCAS ? ? ? ?

## 2021-06-01 ENCOUNTER — Ambulatory Visit (HOSPITAL_COMMUNITY): Payer: No Typology Code available for payment source | Admitting: Licensed Clinical Social Worker

## 2021-06-01 ENCOUNTER — Encounter (HOSPITAL_COMMUNITY): Payer: Self-pay | Admitting: Licensed Clinical Social Worker

## 2021-06-01 NOTE — Progress Notes (Signed)
Virtual Visit via Video Note ? ?I connected with Mark [name] on 05/25/21 at 2:00pm EST by video-enabled telemedicine with the correct person using two identifiers. ?  ?I discussed the limitations of evaluation and management by telemedicine and the availability of in person appointments. The patient expressed understanding and agreed to proceed. ? ?LOCATION: ?Patient: home ?Provider: home office ? ? ? ?History of Present Illness: Pt was referred to [facility] OP therapy for PTSD, depression and anxiety by the [facility]. ? ?Treatment Goal Addressed:   Pt will reduce irritability and increase normal social interaction with family and friends and will develop the ability to recognize, accept, and cope with feelings of depression by using practiced coping skills for depression 1-2x daily AEB self report. ? ?Progress towards Goal: ? Progressing ?   ?Observations/Objective: Patient presented for today?s session on time and was alert, oriented x5, with no evidence or self-report of SI/HI or A/V H.  Patient reported ongoing compliance with medication. Clinician inquired about patient?s current emotional ratings, as well as any significant changes in thoughts, feelings or behavior since previous session. Patient reported emotional rating scores of  8/10 for depression, 8/10 for anxiety, 5/10 for anger/irritability. Cln and pt reviewed emotional ratings and coping skills. Pt reports, "My depression is still high. Nothing is working." Cln continues to suggest [employer] to patient for his worsening depressive symptoms. Patient provided an update: family, low frustration tolerance, health, finances, depressive symptoms, anxiety. Cln and pt reviewed coping skills that work: diversion. Cln and pt reviewed other coping skills that may work: Getting out of the house, going to gym, cooking. What doesn't work: retail therapy, overeating, feeling sorry for self. Cln used CBT  to help motivate patient to increase his physical and mental  wellbeing, in addition to improving confidence/self-image. ? ? ? ?PLAN: [facility], [employer], classes at the [place] ? ? ? ? ?Collaboration of Care: Other: [employer] for reauthorization and [employer] questions ?  ?Patient/Guardian was advised Release of Information must be obtained prior to any record release in order to collaborate their care with an outside provider. Patient/Guardian was advised if they have not already done so to contact the registration department to sign all necessary forms in order for [name] to release information regarding their care.  ?  ?Consent: Patient/Guardian gives verbal consent for treatment and assignment of benefits for services provided during this visit. Patient/Guardian expressed understanding and agreed to proceed.   ? ? ?Assessment and Plan: Counselor will continue to meet with patient to address treatment plan goals. Patient will continue to follow recommendations of providers and implement skills learned in session. ?  ?Follow Up Instructions I discussed the assessment and treatment plan with the patient. The patient was provided an opportunity to ask questions and all were answered. The patient agreed with the plan and demonstrated an understanding of the instructions. ?  ?The patient was advised to call back or seek an in-person evaluation if the symptoms worsen or if the condition fails to improve as anticipated. ? ?I provided 45 minutes of non-face-to-face time during this encounter. ? ? ?Laquinn Shippy S, LCAS ? ?. ? ? ?

## 2021-06-05 ENCOUNTER — Encounter (HOSPITAL_COMMUNITY): Payer: Self-pay | Admitting: Licensed Clinical Social Worker

## 2021-06-05 ENCOUNTER — Ambulatory Visit (INDEPENDENT_AMBULATORY_CARE_PROVIDER_SITE_OTHER): Payer: No Typology Code available for payment source | Admitting: Licensed Clinical Social Worker

## 2021-06-05 DIAGNOSIS — F331 Major depressive disorder, recurrent, moderate: Secondary | ICD-10-CM | POA: Diagnosis not present

## 2021-06-05 DIAGNOSIS — F4310 Post-traumatic stress disorder, unspecified: Secondary | ICD-10-CM

## 2021-06-05 DIAGNOSIS — F419 Anxiety disorder, unspecified: Secondary | ICD-10-CM | POA: Diagnosis not present

## 2021-06-05 NOTE — Progress Notes (Signed)
Virtual Visit via Video Note ? ?I connected with Mark [name] on 06/05/2021 at 5:00 pm EST by a video enabled telemedicine application and verified that I am speaking with the correct person using two identifiers. ?  ?I discussed the limitations of evaluation and management by telemedicine and the availability of in person appointments. The patient expressed understanding and agreed to proceed. ? ?LOCATION: ?Patient: Home ?Provider: home Office ? ?History of Present Illness: [facility], depression, anxiety. ? ?Treatment Goal Addressed: ? Pt will develop the ability to recognize, accept, and cope with feelings of depression AEB self report. ? ?Progress towards Goal:  ?Progressing ? ? ? ?Observation/Objective:    Patient participated in a discussion on fulfillment vs happiness. The group defined: Happiness comes from what we do; Fulfillment comes from why we do it. Pt was encouraged to seek fulfillment which is more sustainable than happiness.  ? ? ?Assessment and Plan: Counselor will continue to meet with patient address treatment plan goals. Patient was given a chance for recommendations of providers and implement skills learned in session. ? ? ? Follow-up instructions:I discussed the assessment and treatment plan with the patient. Patient was provided an opportunity to ask questions and all were answered. The patient agreed with the plan and demonstrated an understanding of the instructions. ?  ?The patient was advised to call back or seek an in-person evaluation if the symptoms worsen or if the condition fails to improve as anticipated. ? ?Collaboration of Care: Other No collaboration of care was needed at this session. ? ?Patient/Guardian was advised Release of Information must be obtained prior to any record release in order to collaborate their care with an outside provider. Patient/Guardian was advised if they have not already done so to contact the  registration department to sign all necessary forms in order for [name] to release information regarding their care.  ? ?Consent: Patient/Guardian gives verbal consent for treatment and assignment of benefits for services provided during this visit. Patient/Guardian expressed understanding and agreed to proceed.  ? ? ? ? ?I provided 75 minutes of non-face-to-face time during this encounter. ? ? ?Mark [name] S, LCAS ? ? ? ?

## 2021-06-08 ENCOUNTER — Other Ambulatory Visit (HOSPITAL_COMMUNITY): Payer: Self-pay

## 2021-06-08 ENCOUNTER — Encounter (HOSPITAL_COMMUNITY): Payer: Self-pay | Admitting: Licensed Clinical Social Worker

## 2021-06-08 ENCOUNTER — Ambulatory Visit (INDEPENDENT_AMBULATORY_CARE_PROVIDER_SITE_OTHER): Payer: No Typology Code available for payment source | Admitting: Licensed Clinical Social Worker

## 2021-06-08 DIAGNOSIS — F419 Anxiety disorder, unspecified: Secondary | ICD-10-CM | POA: Diagnosis not present

## 2021-06-08 DIAGNOSIS — F4310 Post-traumatic stress disorder, unspecified: Secondary | ICD-10-CM

## 2021-06-08 DIAGNOSIS — F331 Major depressive disorder, recurrent, moderate: Secondary | ICD-10-CM

## 2021-06-08 NOTE — Progress Notes (Signed)
Virtual Visit via Video Note  I connected with Mark [name] on 06/08/21 at 2:00pm EST by video-enabled telemedicine with the correct person using two identifiers.   I discussed the limitations of evaluation and management by telemedicine and the availability of in person appointments. The patient expressed understanding and agreed to proceed.  LOCATION: Patient: home Provider: home office    History of Present Illness: Pt was referred to [facility] OP therapy for PTSD, depression and anxiety by the [facility].  Treatment Goal Addressed:   Pt will reduce irritability and increase normal social interaction with family and friends and will develop the ability to recognize, accept, and cope with feelings of depression by using practiced coping skills for depression 1-2x daily AEB self report.  Progress towards Goal:  Progressing    Observations/Objective: Patient presented for today's session on time and was alert, oriented x5, with no evidence or self-report of SI/HI or A/V H.  Patient reported ongoing compliance with medication. Clinician inquired about patient's current emotional ratings, as well as any significant changes in thoughts, feelings or behavior since previous session. Patient reported emotional rating scores of  6/10 for depression, 6/10 for anxiety, 3/10 for anger/irritability. Cln and pt reviewed emotional ratings and coping skills. Pt reports, "My emotional ratings have come down some. I've been very busy this week, which has helped with the loneliness, which has helped with minimizing my depressive, anxiety and irritability symptoms. Cln and pt explored the benefits of scheduling him time to keep busy, however over-scheduling will be a detriment to his mental health. Patient provided an update: family, low frustration tolerance, health, finances, depressive symptoms, anxiety. Again, Cln and pt reviewed other coping skills that may work: Getting out of the house, going to gym, cooking.  What doesn't work: retail therapy, overeating, feeling sorry for self. Clinician validated importance of  Self-compassion and positive self talk.     PLAN: [facility] reauthorization, [employer], classes at the [employer] of Care: Other: [facility] for [facility]ization and [employer] questions   Patient/Guardian was advised Release of Information must be obtained prior to any record release in order to collaborate their care with an outside provider. Patient/Guardian was advised if they have not already done so to contact the registration department to sign all necessary forms in order for [name] to release information regarding their care.    Consent: Patient/Guardian gives verbal consent for treatment and assignment of benefits for services provided during this visit. Patient/Guardian expressed understanding and agreed to proceed.     Assessment and Plan: Counselor will continue to meet with patient to address treatment plan goals. Patient will continue to follow recommendations of providers and implement skills learned in session.   Follow Up Instructions I discussed the assessment and treatment plan with the patient. The patient was provided an opportunity to ask questions and all were answered. The patient agreed with the plan and demonstrated an understanding of the instructions.   The patient was advised to call back or seek an in-person evaluation if the symptoms worsen or if the condition fails to improve as anticipated.  I provided 60 minutes of non-face-to-face time during this encounter.   Naara Kelty S, LCAS  .

## 2021-06-12 ENCOUNTER — Encounter (HOSPITAL_COMMUNITY): Payer: Self-pay | Admitting: Licensed Clinical Social Worker

## 2021-06-12 ENCOUNTER — Ambulatory Visit (INDEPENDENT_AMBULATORY_CARE_PROVIDER_SITE_OTHER): Payer: Medicare HMO | Admitting: Licensed Clinical Social Worker

## 2021-06-12 DIAGNOSIS — F419 Anxiety disorder, unspecified: Secondary | ICD-10-CM | POA: Diagnosis not present

## 2021-06-12 DIAGNOSIS — F331 Major depressive disorder, recurrent, moderate: Secondary | ICD-10-CM | POA: Diagnosis not present

## 2021-06-12 DIAGNOSIS — F4310 Post-traumatic stress disorder, unspecified: Secondary | ICD-10-CM | POA: Diagnosis not present

## 2021-06-12 NOTE — Progress Notes (Signed)
Virtual Visit via Video Note  I connected with Mark [name] on 06/12/2021 at 5:00 pm EST by a video enabled telemedicine application and verified that I am speaking with the correct person using two identifiers.   I discussed the limitations of evaluation and management by telemedicine and the availability of in person appointments. The patient expressed understanding and agreed to proceed.  LOCATION: Patient: Home Provider: home Office  History of Present Illness: [facility], depression, anxiety.  Treatment Goal Addressed:  Pt will develop the ability to recognize, accept, and cope with feelings of depression AEB self report.  Progress towards Goal:  Progressing    Observation/Objective:    Patient participated in a discussion on Memorial Day celebrations: honoring and mourning fallen soldiers, putting flags on fellow veteran's graves, attending [employer], rendering respect of fellow soldiers. Clinician honored each veteran in this group for their respective service in the [employer].   Assessment and Plan: Counselor will continue to meet with patient address treatment plan goals. Patient was given a chance for recommendations of providers and implement skills learned in session.    Follow-up instructions:I discussed the assessment and treatment plan with the patient. Patient was provided an opportunity to ask questions and all were answered. The patient agreed with the plan and demonstrated an understanding of the instructions.   The patient was advised to call back or seek an in-person evaluation if the symptoms worsen or if the condition fails to improve as anticipated.  Collaboration of Care: Other No collaboration of care was needed at this session.  Patient/Guardian was advised Release of Information must be obtained prior to any record release in order to collaborate their care with an outside provider.  Patient/Guardian was advised if they have not already done so to contact the registration department to sign all necessary forms in order for [name] to release information regarding their care.   Consent: Patient/Guardian gives verbal consent for treatment and assignment of benefits for services provided during this visit. Patient/Guardian expressed understanding and agreed to proceed.      I provided 75 minutes of non-face-to-face time during this encounter.   Omayra Tulloch S, LCAS

## 2021-06-15 ENCOUNTER — Encounter (HOSPITAL_COMMUNITY): Payer: Self-pay | Admitting: Licensed Clinical Social Worker

## 2021-06-15 ENCOUNTER — Ambulatory Visit (INDEPENDENT_AMBULATORY_CARE_PROVIDER_SITE_OTHER): Payer: Medicare HMO | Admitting: Licensed Clinical Social Worker

## 2021-06-15 DIAGNOSIS — F4310 Post-traumatic stress disorder, unspecified: Secondary | ICD-10-CM

## 2021-06-15 DIAGNOSIS — F419 Anxiety disorder, unspecified: Secondary | ICD-10-CM

## 2021-06-15 DIAGNOSIS — F331 Major depressive disorder, recurrent, moderate: Secondary | ICD-10-CM

## 2021-06-15 NOTE — Addendum Note (Signed)
Addended by: Vernona Rieger on: 06/15/2021 10:10 AM   Modules accepted: Level of Service

## 2021-06-22 ENCOUNTER — Ambulatory Visit (INDEPENDENT_AMBULATORY_CARE_PROVIDER_SITE_OTHER): Payer: Medicare HMO | Admitting: Licensed Clinical Social Worker

## 2021-06-22 DIAGNOSIS — F419 Anxiety disorder, unspecified: Secondary | ICD-10-CM

## 2021-06-22 DIAGNOSIS — F4310 Post-traumatic stress disorder, unspecified: Secondary | ICD-10-CM

## 2021-06-22 DIAGNOSIS — F331 Major depressive disorder, recurrent, moderate: Secondary | ICD-10-CM

## 2021-06-26 ENCOUNTER — Encounter (HOSPITAL_COMMUNITY): Payer: Self-pay | Admitting: Licensed Clinical Social Worker

## 2021-06-26 ENCOUNTER — Ambulatory Visit (INDEPENDENT_AMBULATORY_CARE_PROVIDER_SITE_OTHER): Payer: Medicare HMO | Admitting: Licensed Clinical Social Worker

## 2021-06-26 DIAGNOSIS — F419 Anxiety disorder, unspecified: Secondary | ICD-10-CM

## 2021-06-26 DIAGNOSIS — F4310 Post-traumatic stress disorder, unspecified: Secondary | ICD-10-CM

## 2021-06-26 DIAGNOSIS — F331 Major depressive disorder, recurrent, moderate: Secondary | ICD-10-CM

## 2021-06-26 NOTE — Progress Notes (Deleted)
Virtual Visit via Video Note  I connected with Mark [name] on 06/26/2021 at 5:00 pm EST by a video enabled telemedicine application and verified that I am speaking with the correct person using two identifiers.   I discussed the limitations of evaluation and management by telemedicine and the availability of in person appointments. The patient expressed understanding and agreed to proceed.  LOCATION: Patient: Home Provider: home Office  History of Present Illness: [facility], depression, anxiety.  Treatment Goal Addressed:  Pt will develop the ability to recognize, accept, and cope with feelings of depression AEB self report.  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 Clinician again thanked veterans for their service.    Assessment and Plan: Counselor will continue to meet with patient address treatment plan goals. Patient was given a chance for recommendations of providers and implement skills learned in session.    Follow-up instructions:I discussed the assessment and treatment plan with the patient. Patient was provided an opportunity to ask questions and all were answered. The patient agreed with the plan and demonstrated an understanding of the instructions.   The patient was advised to call back or seek an in-person evaluation if the symptoms worsen or if the condition fails to improve as anticipated.  Collaboration of Care: Other: Contact community care for re-authorization  Patient/Guardian was advised Release of Information must be obtained prior to any record release in order to collaborate their care with an outside  provider. Patient/Guardian was advised if they have not already done so to contact the registration department to sign all necessary forms in order for [name] to release information regarding their care.   Consent: Patient/Guardian gives verbal consent for treatment and assignment of benefits for services provided during this visit. Patient/Guardian expressed understanding and agreed to proceed.      I provided 75 minutes of non-face-to-face time during this encounter.   Ashby Leflore S, LCAS

## 2021-06-29 ENCOUNTER — Ambulatory Visit (INDEPENDENT_AMBULATORY_CARE_PROVIDER_SITE_OTHER): Payer: Medicare HMO | Admitting: Licensed Clinical Social Worker

## 2021-06-29 ENCOUNTER — Encounter (HOSPITAL_COMMUNITY): Payer: Self-pay | Admitting: Licensed Clinical Social Worker

## 2021-06-29 DIAGNOSIS — F331 Major depressive disorder, recurrent, moderate: Secondary | ICD-10-CM

## 2021-06-29 DIAGNOSIS — F419 Anxiety disorder, unspecified: Secondary | ICD-10-CM

## 2021-06-29 DIAGNOSIS — F4310 Post-traumatic stress disorder, unspecified: Secondary | ICD-10-CM

## 2021-06-29 NOTE — Progress Notes (Signed)
Virtual Visit via Video Note  I connected with Mark [name] on 06/29/21 at 2:00pm EST by video-enabled telemedicine with the correct person using two identifiers.   I discussed the limitations of evaluation and management by telemedicine and the availability of in person appointments. The patient expressed understanding and agreed to proceed.  LOCATION: Patient: home Provider: home office    History of Present Illness: Pt was referred to [facility] OP therapy for PTSD, depression and anxiety by the [facility].  Treatment Goal Addressed:   Pt will reduce irritability and increase normal social interaction with family and friends and will develop the ability to recognize, accept, and cope with feelings of depression by using practiced coping skills for depression 1-2x daily AEB self report.  Progress towards Goal:  Progressing    Observations/Objective: Patient presented for today's session on time and was alert, oriented x5, with no evidence or self-report of SI/HI or A/V H.  Patient reported ongoing compliance with medication. Clinician inquired about patient's current emotional ratings, as well as any significant changes in thoughts, feelings or behavior since previous session. Patient reported emotional rating scores of  8/10 for depression, 8/10 for anxiety, 6/10 for anger/irritability. Cln and pt reviewed emotional ratings and coping skills. Pt reports, "I'm in a bad place. It always begins with my family disappointing me which then affects my job which then affects my thinking capability and then I get angry. Cln and pt explored other feelings: sad, frustrated, hurt, anger is a secondary emotion. Clinician utilized CBT to process concerns about family expectations, limitations as an amputee, lack of family communication and work challenges. Clinician processed thoughts, feelings, and behaviors. Clinician discussed coping skills and options for supporting himself through these challenging times.  Cln and pt explored his gratitudes.          PLAN: future therapy at [facility] OP     Collaboration of Care: Other: [facility] for [facility]ization and [employer] questions   Patient/Guardian was advised Release of Information must be obtained prior to any record release in order to collaborate their care with an outside provider. Patient/Guardian was advised if they have not already done so to contact the registration department to sign all necessary forms in order for [name] to release information regarding their care.    Consent: Patient/Guardian gives verbal consent for treatment and assignment of benefits for services provided during this visit. Patient/Guardian expressed understanding and agreed to proceed.     Assessment and Plan: Counselor will continue to meet with patient to address treatment plan goals. Patient will continue to follow recommendations of providers and implement skills learned in session.   Follow Up Instructions I discussed the assessment and treatment plan with the patient. The patient was provided an opportunity to ask questions and all were answered. The patient agreed with the plan and demonstrated an understanding of the instructions.   The patient was advised to call back or seek an in-person evaluation if the symptoms worsen or if the condition fails to improve as anticipated.  I provided 30 minutes of non-face-to-face time during this encounter.   Jillyan Plitt S, LCAS  .

## 2021-07-03 ENCOUNTER — Ambulatory Visit (HOSPITAL_COMMUNITY): Payer: No Typology Code available for payment source | Admitting: Licensed Clinical Social Worker

## 2021-07-03 DIAGNOSIS — Z0100 Encounter for examination of eyes and vision without abnormal findings: Secondary | ICD-10-CM | POA: Diagnosis not present

## 2021-07-04 NOTE — Progress Notes (Signed)
Virtual Visit via Video Note  I connected with Mark [name] on 06/26/2021 at 5:00 pm EST by a video enabled telemedicine application and verified that I am speaking with the correct person using two identifiers.   I discussed the limitations of evaluation and management by telemedicine and the availability of in person appointments. The patient expressed understanding and agreed to proceed.  LOCATION: Patient: Home Provider: home Office  History of Present Illness: [facility], depression, anxiety.  Treatment Goal Addressed:  Pt will develop the ability to recognize, accept, and cope with feelings of depression AEB self report.  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 Clinician again thanked veteran for his service.    Assessment and Plan: Counselor will continue to meet with patient address treatment plan goals. Patient was given a chance for recommendations of providers and implement skills learned in session.    Follow-up instructions:I discussed the assessment and treatment plan with the patient. Patient was provided an opportunity to ask questions and all were answered. The patient agreed with the plan and demonstrated an understanding of the instructions.   The patient was advised to call back or seek an in-person evaluation if the symptoms worsen or if the condition fails to improve as anticipated.  Collaboration of Care: Other No collaboration of care was needed at this session.  Patient/Guardian was advised Release of Information must be obtained prior to any record release in order to collaborate their care with an  outside provider. Patient/Guardian was advised if they have not already done so to contact the registration department to sign all necessary forms in order for [name] to release information regarding their care.   Consent: Patient/Guardian gives verbal consent for treatment and assignment of benefits for services provided during this visit. Patient/Guardian expressed understanding and agreed to proceed.      I provided 75 minutes of non-face-to-face time during this encounter.   Caresse Sedivy S, LCAS

## 2021-07-06 ENCOUNTER — Ambulatory Visit (INDEPENDENT_AMBULATORY_CARE_PROVIDER_SITE_OTHER): Payer: Medicare HMO | Admitting: Licensed Clinical Social Worker

## 2021-07-06 DIAGNOSIS — F4310 Post-traumatic stress disorder, unspecified: Secondary | ICD-10-CM | POA: Diagnosis not present

## 2021-07-06 DIAGNOSIS — F419 Anxiety disorder, unspecified: Secondary | ICD-10-CM | POA: Diagnosis not present

## 2021-07-06 DIAGNOSIS — F331 Major depressive disorder, recurrent, moderate: Secondary | ICD-10-CM | POA: Diagnosis not present

## 2021-07-06 NOTE — Progress Notes (Signed)
Virtual Visit via Video Note  I connected with Mark [name] on 07/06/21 at 2:00pm EST by video-enabled telemedicine with the correct person using two identifiers.   I discussed the limitations of evaluation and management by telemedicine and the availability of in person appointments. The patient expressed understanding and agreed to proceed.  LOCATION: Patient: home Provider: home office    History of Present Illness: Pt was referred to [facility] OP therapy for PTSD, depression and anxiety by the [facility].  Treatment Goal Addressed:   Pt will reduce irritability and increase normal social interaction with family and friends and will develop the ability to recognize, accept, and cope with feelings of depression by using practiced coping skills for depression 1-2x daily AEB self report.  Progress towards Goal:  Progressing    Observations/Objective: Patient presented for today's session on time and was alert, oriented x5, with no evidence or self-report of SI/HI or A/V H.  Patient reported ongoing compliance with medication. Clinician inquired about patient's current emotional ratings, as well as any significant changes in thoughts, feelings or behavior since previous session. Patient reported emotional rating scores of  8/10 for depression, 8/10 for anxiety, 6/10 for anger/irritability. Cln and pt reviewed emotional ratings and coping skills. Pt reports, "My moods have been mixed. I have anxiety and then depression. I practice my skills for both, sometimes they work, sometimes not." Cln provided educaton on the cycles of anxiety and depression. Cln provided education on coping skills, what doesn't work, then try another one and cln suggested pt review his worksheets on coping skills. 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 Clinician processed thoughts, feelings, and behaviors. Clinician discussed  coping skills and options for supporting himself through these challenging times. Cln and pt explored his gratitudes.             Collaboration of Care: Other: Contact [facility] for reauthorization and [employer] questions   Patient/Guardian was advised Release of Information must be obtained prior to any record release in order to collaborate their care with an outside provider. Patient/Guardian was advised if they have not already done so to contact the registration department to sign all necessary forms in order for [name] to release information regarding their care.    Consent: Patient/Guardian gives verbal consent for treatment and assignment of benefits for services provided during this visit. Patient/Guardian expressed understanding and agreed to proceed.     Assessment and Plan: Counselor will continue to meet with patient to address treatment plan goals. Patient will continue to follow recommendations of providers and implement skills learned in session.   Follow Up Instructions I discussed the assessment and treatment plan with the patient. The patient was provided an opportunity to ask questions and all were answered. The patient agreed with the plan and demonstrated an understanding of the instructions.   The patient was advised to call back or seek an in-person evaluation if the symptoms worsen or if the condition fails to improve as anticipated.  I provided 30 minutes of non-face-to-face time during this encounter.   Jahzara Slattery S, LCAS  .

## 2021-07-10 ENCOUNTER — Encounter (HOSPITAL_COMMUNITY): Payer: Self-pay | Admitting: Licensed Clinical Social Worker

## 2021-07-10 ENCOUNTER — Ambulatory Visit (INDEPENDENT_AMBULATORY_CARE_PROVIDER_SITE_OTHER): Payer: Medicare HMO | Admitting: Licensed Clinical Social Worker

## 2021-07-10 DIAGNOSIS — F4310 Post-traumatic stress disorder, unspecified: Secondary | ICD-10-CM

## 2021-07-10 DIAGNOSIS — F331 Major depressive disorder, recurrent, moderate: Secondary | ICD-10-CM

## 2021-07-10 DIAGNOSIS — F419 Anxiety disorder, unspecified: Secondary | ICD-10-CM | POA: Diagnosis not present

## 2021-07-10 NOTE — Progress Notes (Signed)
Virtual Visit via Video Note  I connected with Mark [name] on 07/10/2021 at 5:00 pm EST by a video enabled telemedicine application and verified that I am speaking with the correct person using two identifiers.   I discussed the limitations of evaluation and management by telemedicine and the availability of in person appointments. The patient expressed understanding and agreed to proceed.  LOCATION: Patient: Home Provider: home Office  History of Present Illness: [facility], [facility].  Treatment Goal Addressed:  Pt will develop the ability to recognize, accept, and cope with feelings of depression AEB self report.  Progress towards Goal:  Progressing    Observation/Objective:   Patient participated in a discussion on Father's Day. It was bittersweet for some, having lost his father, not hearing from children, and remembering past Father's Days. Cln encouraged pt to remember the past bittersweet memories.    Assessment and Plan: Counselor will continue to meet with patient address treatment plan goals. Patient was given a chance for recommendations of providers and implement skills learned in session.    Follow-up instructions:I discussed the assessment and treatment plan with the patient. Patient was provided an opportunity to ask questions and all were answered. The patient agreed with the plan and demonstrated an understanding of the instructions.   The patient was advised to call back or seek an in-person evaluation if the symptoms worsen or if the condition fails to improve as anticipated.  Collaboration of Care: Other No collaboration of care was needed at this session.  Patient/Guardian was advised Release of Information must be obtained prior to any record release in order to collaborate their care with an outside provider. Patient/Guardian was advised if they have not already done so to contact the registration  department to sign all necessary forms in order for [name] to release information regarding their care.   Consent: Patient/Guardian gives verbal consent for treatment and assignment of benefits for services provided during this visit. Patient/Guardian expressed understanding and agreed to proceed.      I provided 75 minutes of non-face-to-face time during this encounter.   Lagretta Loseke S, LCAS

## 2021-07-13 ENCOUNTER — Encounter (HOSPITAL_COMMUNITY): Payer: Self-pay | Admitting: Licensed Clinical Social Worker

## 2021-07-13 ENCOUNTER — Ambulatory Visit (INDEPENDENT_AMBULATORY_CARE_PROVIDER_SITE_OTHER): Payer: Medicare HMO | Admitting: Licensed Clinical Social Worker

## 2021-07-13 DIAGNOSIS — F4310 Post-traumatic stress disorder, unspecified: Secondary | ICD-10-CM

## 2021-07-13 DIAGNOSIS — F331 Major depressive disorder, recurrent, moderate: Secondary | ICD-10-CM | POA: Diagnosis not present

## 2021-07-13 DIAGNOSIS — F419 Anxiety disorder, unspecified: Secondary | ICD-10-CM

## 2021-07-13 NOTE — Addendum Note (Signed)
Addended by: Vernona Rieger on: 07/13/2021 03:07 PM   Modules accepted: Level of Service
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
## 2021-07-17 ENCOUNTER — Encounter (HOSPITAL_COMMUNITY): Payer: Self-pay | Admitting: Licensed Clinical Social Worker

## 2021-07-17 ENCOUNTER — Ambulatory Visit (INDEPENDENT_AMBULATORY_CARE_PROVIDER_SITE_OTHER): Payer: Medicare HMO | Admitting: Licensed Clinical Social Worker

## 2021-07-17 DIAGNOSIS — F4310 Post-traumatic stress disorder, unspecified: Secondary | ICD-10-CM

## 2021-07-17 DIAGNOSIS — F419 Anxiety disorder, unspecified: Secondary | ICD-10-CM | POA: Diagnosis not present

## 2021-07-17 DIAGNOSIS — F331 Major depressive disorder, recurrent, moderate: Secondary | ICD-10-CM

## 2021-07-20 ENCOUNTER — Ambulatory Visit (HOSPITAL_COMMUNITY): Payer: Medicare HMO | Admitting: Licensed Clinical Social Worker

## 2021-07-21 ENCOUNTER — Emergency Department (HOSPITAL_COMMUNITY): Payer: No Typology Code available for payment source

## 2021-07-21 ENCOUNTER — Encounter (HOSPITAL_COMMUNITY): Payer: Self-pay

## 2021-07-21 ENCOUNTER — Inpatient Hospital Stay (HOSPITAL_COMMUNITY)
Admission: EM | Admit: 2021-07-21 | Discharge: 2021-07-25 | DRG: 637 | Disposition: A | Discharge status: Home / Self Care | Payer: No Typology Code available for payment source | Attending: Internal Medicine | Admitting: Internal Medicine

## 2021-07-21 ENCOUNTER — Other Ambulatory Visit: Payer: Self-pay

## 2021-07-21 DIAGNOSIS — E1310 Other specified diabetes mellitus with ketoacidosis without coma: Principal | ICD-10-CM

## 2021-07-21 DIAGNOSIS — F411 Generalized anxiety disorder: Secondary | ICD-10-CM | POA: Diagnosis present

## 2021-07-21 DIAGNOSIS — A059 Bacterial foodborne intoxication, unspecified: Secondary | ICD-10-CM | POA: Diagnosis present

## 2021-07-21 DIAGNOSIS — F32A Depression, unspecified: Secondary | ICD-10-CM | POA: Diagnosis present

## 2021-07-21 DIAGNOSIS — E1143 Type 2 diabetes mellitus with diabetic autonomic (poly)neuropathy: Secondary | ICD-10-CM | POA: Diagnosis present

## 2021-07-21 DIAGNOSIS — E669 Obesity, unspecified: Secondary | ICD-10-CM | POA: Diagnosis present

## 2021-07-21 DIAGNOSIS — N179 Acute kidney failure, unspecified: Secondary | ICD-10-CM | POA: Diagnosis present

## 2021-07-21 DIAGNOSIS — Z91128 Patient's intentional underdosing of medication regimen for other reason: Secondary | ICD-10-CM | POA: Diagnosis not present

## 2021-07-21 DIAGNOSIS — Z87891 Personal history of nicotine dependence: Secondary | ICD-10-CM | POA: Diagnosis not present

## 2021-07-21 DIAGNOSIS — Z794 Long term (current) use of insulin: Secondary | ICD-10-CM | POA: Diagnosis not present

## 2021-07-21 DIAGNOSIS — Z22322 Carrier or suspected carrier of Methicillin resistant Staphylococcus aureus: Secondary | ICD-10-CM

## 2021-07-21 DIAGNOSIS — Z888 Allergy status to other drugs, medicaments and biological substances status: Secondary | ICD-10-CM | POA: Diagnosis not present

## 2021-07-21 DIAGNOSIS — E785 Hyperlipidemia, unspecified: Secondary | ICD-10-CM | POA: Diagnosis present

## 2021-07-21 DIAGNOSIS — K5900 Constipation, unspecified: Secondary | ICD-10-CM | POA: Diagnosis present

## 2021-07-21 DIAGNOSIS — Z79899 Other long term (current) drug therapy: Secondary | ICD-10-CM

## 2021-07-21 DIAGNOSIS — E860 Dehydration: Secondary | ICD-10-CM | POA: Diagnosis present

## 2021-07-21 DIAGNOSIS — Z9989 Dependence on other enabling machines and devices: Secondary | ICD-10-CM | POA: Diagnosis not present

## 2021-07-21 DIAGNOSIS — K859 Acute pancreatitis without necrosis or infection, unspecified: Secondary | ICD-10-CM | POA: Diagnosis not present

## 2021-07-21 DIAGNOSIS — Z806 Family history of leukemia: Secondary | ICD-10-CM

## 2021-07-21 DIAGNOSIS — R1012 Left upper quadrant pain: Secondary | ICD-10-CM | POA: Diagnosis not present

## 2021-07-21 DIAGNOSIS — I10 Essential (primary) hypertension: Secondary | ICD-10-CM | POA: Diagnosis present

## 2021-07-21 DIAGNOSIS — Z89611 Acquired absence of right leg above knee: Secondary | ICD-10-CM

## 2021-07-21 DIAGNOSIS — F418 Other specified anxiety disorders: Secondary | ICD-10-CM | POA: Diagnosis not present

## 2021-07-21 DIAGNOSIS — R112 Nausea with vomiting, unspecified: Secondary | ICD-10-CM

## 2021-07-21 DIAGNOSIS — E1110 Type 2 diabetes mellitus with ketoacidosis without coma: Secondary | ICD-10-CM | POA: Diagnosis not present

## 2021-07-21 DIAGNOSIS — R739 Hyperglycemia, unspecified: Secondary | ICD-10-CM | POA: Diagnosis not present

## 2021-07-21 DIAGNOSIS — R000 Tachycardia, unspecified: Secondary | ICD-10-CM | POA: Diagnosis not present

## 2021-07-21 DIAGNOSIS — G4733 Obstructive sleep apnea (adult) (pediatric): Secondary | ICD-10-CM | POA: Diagnosis present

## 2021-07-21 DIAGNOSIS — F4310 Post-traumatic stress disorder, unspecified: Secondary | ICD-10-CM | POA: Diagnosis present

## 2021-07-21 DIAGNOSIS — T383X6A Underdosing of insulin and oral hypoglycemic [antidiabetic] drugs, initial encounter: Secondary | ICD-10-CM | POA: Diagnosis present

## 2021-07-21 DIAGNOSIS — Z6839 Body mass index (BMI) 39.0-39.9, adult: Secondary | ICD-10-CM | POA: Diagnosis not present

## 2021-07-21 DIAGNOSIS — R1084 Generalized abdominal pain: Secondary | ICD-10-CM | POA: Diagnosis not present

## 2021-07-21 DIAGNOSIS — K3184 Gastroparesis: Secondary | ICD-10-CM | POA: Diagnosis present

## 2021-07-21 HISTORY — DX: Sleep apnea, unspecified: G47.30

## 2021-07-21 LAB — CBC
HCT: 44.2 % (ref 39.0–52.0)
Hemoglobin: 14.3 g/dL (ref 13.0–17.0)
MCH: 27.5 pg (ref 26.0–34.0)
MCHC: 32.4 g/dL (ref 30.0–36.0)
MCV: 85 fL (ref 80.0–100.0)
Platelets: 291 10*3/uL (ref 150–400)
RBC: 5.2 MIL/uL (ref 4.22–5.81)
RDW: 14.3 % (ref 11.5–15.5)
WBC: 15.7 10*3/uL — ABNORMAL HIGH (ref 4.0–10.5)
nRBC: 0 % (ref 0.0–0.2)

## 2021-07-21 LAB — I-STAT VENOUS BLOOD GAS, ED
Acid-base deficit: 2 mmol/L (ref 0.0–2.0)
Bicarbonate: 20.6 mmol/L (ref 20.0–28.0)
Calcium, Ion: 1.08 mmol/L — ABNORMAL LOW (ref 1.15–1.40)
HCT: 45 % (ref 39.0–52.0)
Hemoglobin: 15.3 g/dL (ref 13.0–17.0)
O2 Saturation: 100 %
Potassium: 4.3 mmol/L (ref 3.5–5.1)
Sodium: 135 mmol/L (ref 135–145)
TCO2: 21 mmol/L — ABNORMAL LOW (ref 22–32)
pCO2, Ven: 28.2 mmHg — ABNORMAL LOW (ref 44–60)
pH, Ven: 7.472 — ABNORMAL HIGH (ref 7.25–7.43)
pO2, Ven: 184 mmHg — ABNORMAL HIGH (ref 32–45)

## 2021-07-21 LAB — LIPASE, BLOOD: Lipase: 40 U/L (ref 11–51)

## 2021-07-21 LAB — LACTIC ACID, PLASMA
Lactic Acid, Venous: 2.1 mmol/L (ref 0.5–1.9)
Lactic Acid, Venous: 2.5 mmol/L (ref 0.5–1.9)

## 2021-07-21 LAB — COMPREHENSIVE METABOLIC PANEL
ALT: 16 U/L (ref 0–44)
AST: 15 U/L (ref 15–41)
Albumin: 3.6 g/dL (ref 3.5–5.0)
Alkaline Phosphatase: 111 U/L (ref 38–126)
Anion gap: 22 — ABNORMAL HIGH (ref 5–15)
BUN: 25 mg/dL — ABNORMAL HIGH (ref 6–20)
CO2: 19 mmol/L — ABNORMAL LOW (ref 22–32)
Calcium: 9.3 mg/dL (ref 8.9–10.3)
Chloride: 95 mmol/L — ABNORMAL LOW (ref 98–111)
Creatinine, Ser: 1.48 mg/dL — ABNORMAL HIGH (ref 0.61–1.24)
GFR, Estimated: 54 mL/min — ABNORMAL LOW (ref >60)
Glucose, Bld: 422 mg/dL — ABNORMAL HIGH (ref 70–99)
Potassium: 4.1 mmol/L (ref 3.5–5.1)
Sodium: 136 mmol/L (ref 135–145)
Total Bilirubin: 1.8 mg/dL — ABNORMAL HIGH (ref 0.3–1.2)
Total Protein: 7.7 g/dL (ref 6.5–8.1)

## 2021-07-21 LAB — BASIC METABOLIC PANEL
Anion gap: 12 (ref 5–15)
Anion gap: 9 (ref 5–15)
BUN: 20 mg/dL (ref 6–20)
BUN: 22 mg/dL — ABNORMAL HIGH (ref 6–20)
CO2: 26 mmol/L (ref 22–32)
CO2: 28 mmol/L (ref 22–32)
Calcium: 9.3 mg/dL (ref 8.9–10.3)
Calcium: 9 mg/dL (ref 8.9–10.3)
Chloride: 100 mmol/L (ref 98–111)
Chloride: 102 mmol/L (ref 98–111)
Creatinine, Ser: 1.16 mg/dL (ref 0.61–1.24)
Creatinine, Ser: 1.1 mg/dL (ref 0.61–1.24)
GFR, Estimated: >60 mL/min (ref >60)
GFR, Estimated: >60 mL/min (ref >60)
Glucose, Bld: 188 mg/dL — ABNORMAL HIGH (ref 70–99)
Glucose, Bld: 194 mg/dL — ABNORMAL HIGH (ref 70–99)
Potassium: 3.9 mmol/L (ref 3.5–5.1)
Potassium: 4.6 mmol/L (ref 3.5–5.1)
Sodium: 138 mmol/L (ref 135–145)
Sodium: 139 mmol/L (ref 135–145)

## 2021-07-21 LAB — URINALYSIS, ROUTINE W REFLEX MICROSCOPIC
Bacteria, UA: NONE SEEN
Bilirubin Urine: NEGATIVE
Glucose, UA: >=500 mg/dL — AB
Ketones, ur: 80 mg/dL — AB
Leukocytes,Ua: NEGATIVE
Nitrite: NEGATIVE
Protein, ur: 100 mg/dL — AB
Specific Gravity, Urine: 1.031 — ABNORMAL HIGH (ref 1.005–1.030)
pH: 5 (ref 5.0–8.0)

## 2021-07-21 LAB — GLUCOSE, CAPILLARY
Glucose-Capillary: 125 mg/dL — ABNORMAL HIGH (ref 70–99)
Glucose-Capillary: 130 mg/dL — ABNORMAL HIGH (ref 70–99)
Glucose-Capillary: 137 mg/dL — ABNORMAL HIGH (ref 70–99)
Glucose-Capillary: 178 mg/dL — ABNORMAL HIGH (ref 70–99)
Glucose-Capillary: 199 mg/dL — ABNORMAL HIGH (ref 70–99)

## 2021-07-21 LAB — RETICULOCYTES
Immature Retic Fract: 11.5 % (ref 2.3–15.9)
RBC.: 4.81 MIL/uL (ref 4.22–5.81)
Retic Count, Absolute: 66.4 10*3/uL (ref 19.0–186.0)
Retic Ct Pct: 1.4 % (ref 0.4–3.1)

## 2021-07-21 LAB — CBG MONITORING, ED
Glucose-Capillary: 410 mg/dL — ABNORMAL HIGH (ref 70–99)
Glucose-Capillary: 333 mg/dL — ABNORMAL HIGH (ref 70–99)
Glucose-Capillary: 273 mg/dL — ABNORMAL HIGH (ref 70–99)
Glucose-Capillary: 248 mg/dL — ABNORMAL HIGH (ref 70–99)
Glucose-Capillary: 243 mg/dL — ABNORMAL HIGH (ref 70–99)
Glucose-Capillary: 182 mg/dL — ABNORMAL HIGH (ref 70–99)
Glucose-Capillary: 221 mg/dL — ABNORMAL HIGH (ref 70–99)
Glucose-Capillary: 191 mg/dL — ABNORMAL HIGH (ref 70–99)
Glucose-Capillary: 186 mg/dL — ABNORMAL HIGH (ref 70–99)
Glucose-Capillary: 200 mg/dL — ABNORMAL HIGH (ref 70–99)

## 2021-07-21 LAB — OSMOLALITY: Osmolality: 305 mOsm/kg — ABNORMAL HIGH (ref 275–295)

## 2021-07-21 LAB — SALICYLATE LEVEL: Salicylate Lvl: <7 mg/dL — ABNORMAL LOW (ref 7.0–30.0)

## 2021-07-21 LAB — ETHANOL: Alcohol, Ethyl (B): <10 mg/dL (ref <10)

## 2021-07-21 LAB — PROTIME-INR
INR: 1 (ref 0.8–1.2)
Prothrombin Time: 13.5 seconds (ref 11.4–15.2)

## 2021-07-21 LAB — APTT: aPTT: 29 seconds (ref 24–36)

## 2021-07-21 LAB — PHOSPHORUS: Phosphorus: 2.3 mg/dL — ABNORMAL LOW (ref 2.5–4.6)

## 2021-07-21 LAB — BETA-HYDROXYBUTYRIC ACID: Beta-Hydroxybutyric Acid: 5.77 mmol/L — ABNORMAL HIGH (ref 0.05–0.27)

## 2021-07-21 LAB — HIV ANTIBODY (ROUTINE TESTING W REFLEX): HIV Screen 4th Generation wRfx: NONREACTIVE

## 2021-07-21 LAB — MAGNESIUM: Magnesium: 2 mg/dL (ref 1.7–2.4)

## 2021-07-21 MED ORDER — ONDANSETRON 4 MG PO TBDP
4.0000 mg | ORAL_TABLET | Freq: Once | ORAL | Status: AC | PRN
  Administered 2021-07-21: 4 mg via ORAL
  Filled 2021-07-21: qty 1

## 2021-07-21 MED ORDER — LACTATED RINGERS IV SOLN: INTRAVENOUS | Status: DC

## 2021-07-21 MED ORDER — LACTATED RINGERS IV BOLUS: 20.0000 mL/kg | Freq: Once | INTRAVENOUS | Status: DC

## 2021-07-21 MED ORDER — SODIUM CHLORIDE 0.9% FLUSH
3.0000 mL | Freq: Two times a day (BID) | INTRAVENOUS | Status: DC
  Administered 2021-07-21 – 2021-07-25 (×8): 3 mL via INTRAVENOUS

## 2021-07-21 MED ORDER — IOHEXOL 300 MG/ML  SOLN
80.0000 mL | Freq: Once | INTRAMUSCULAR | Status: AC | PRN
  Administered 2021-07-21: 80 mL via INTRAVENOUS

## 2021-07-21 MED ORDER — ACETAMINOPHEN 650 MG RE SUPP: 650.0000 mg | Freq: Four times a day (QID) | RECTAL | Status: DC | PRN

## 2021-07-21 MED ORDER — DEXTROSE 50 % IV SOLN: 0.0000 mL | INTRAVENOUS | Status: DC | PRN

## 2021-07-21 MED ORDER — PRAZOSIN HCL 2 MG PO CAPS
2.0000 mg | ORAL_CAPSULE | Freq: Every day | ORAL | Status: DC
  Administered 2021-07-21 – 2021-07-24 (×4): 2 mg via ORAL
  Filled 2021-07-21 (×5): qty 1

## 2021-07-21 MED ORDER — DEXTROSE IN LACTATED RINGERS 5 % IV SOLN: INTRAVENOUS | Status: DC

## 2021-07-21 MED ORDER — ONDANSETRON HCL 4 MG PO TABS
4.0000 mg | ORAL_TABLET | Freq: Four times a day (QID) | ORAL | Status: DC | PRN
  Administered 2021-07-24: 4 mg via ORAL
  Filled 2021-07-21: qty 1

## 2021-07-21 MED ORDER — LACTATED RINGERS IV SOLN
INTRAVENOUS | Status: DC
Start: 2021-07-21 — End: 2021-07-22

## 2021-07-21 MED ORDER — TRAZODONE HCL 50 MG PO TABS
50.0000 mg | ORAL_TABLET | Freq: Every evening | ORAL | Status: DC | PRN
Start: 2021-07-21 — End: 2021-07-25
  Administered 2021-07-22 – 2021-07-24 (×4): 50 mg via ORAL
  Filled 2021-07-21 (×4): qty 1

## 2021-07-21 MED ORDER — INSULIN GLARGINE-YFGN 100 UNIT/ML ~~LOC~~ SOLN: 20.0000 [IU] | Freq: Two times a day (BID) | SUBCUTANEOUS | Status: DC

## 2021-07-21 MED ORDER — DEXTROSE IN LACTATED RINGERS 5 % IV SOLN
INTRAVENOUS | Status: DC
Start: 2021-07-21 — End: 2021-07-22

## 2021-07-21 MED ORDER — ONDANSETRON HCL 4 MG/2ML IJ SOLN
4.0000 mg | Freq: Four times a day (QID) | INTRAMUSCULAR | Status: DC | PRN
  Administered 2021-07-21 – 2021-07-25 (×9): 4 mg via INTRAVENOUS
  Filled 2021-07-21 (×9): qty 2

## 2021-07-21 MED ORDER — ACETAMINOPHEN 325 MG PO TABS
650.0000 mg | ORAL_TABLET | Freq: Four times a day (QID) | ORAL | Status: DC | PRN
  Administered 2021-07-22 – 2021-07-23 (×4): 650 mg via ORAL
  Filled 2021-07-21 (×4): qty 2

## 2021-07-21 MED ORDER — INSULIN ASPART 100 UNIT/ML IJ SOLN: 0.0000 [IU] | Freq: Every day | INTRAMUSCULAR | Status: DC

## 2021-07-21 MED ORDER — POTASSIUM CHLORIDE 10 MEQ/100ML IV SOLN
10.0000 meq | INTRAVENOUS | Status: AC
  Administered 2021-07-21 (×2): 10 meq via INTRAVENOUS
  Filled 2021-07-21 (×2): qty 100

## 2021-07-21 MED ORDER — LACTATED RINGERS IV BOLUS
1000.0000 mL | Freq: Once | INTRAVENOUS | Status: AC
  Administered 2021-07-21: 1000 mL via INTRAVENOUS

## 2021-07-21 MED ORDER — INSULIN ASPART 100 UNIT/ML IJ SOLN
0.0000 [IU] | Freq: Three times a day (TID) | INTRAMUSCULAR | Status: DC
  Administered 2021-07-22: 15 [IU] via SUBCUTANEOUS
  Administered 2021-07-22: 11 [IU] via SUBCUTANEOUS
  Administered 2021-07-23: 15 [IU] via SUBCUTANEOUS
  Administered 2021-07-23: 11 [IU] via SUBCUTANEOUS
  Administered 2021-07-23 – 2021-07-24 (×2): 3 [IU] via SUBCUTANEOUS
  Administered 2021-07-24: 4 [IU] via SUBCUTANEOUS
  Administered 2021-07-24: 3 [IU] via SUBCUTANEOUS
  Administered 2021-07-25: 4 [IU] via SUBCUTANEOUS

## 2021-07-21 MED ORDER — INSULIN GLARGINE-YFGN 100 UNIT/ML ~~LOC~~ SOLN
20.0000 [IU] | Freq: Every day | SUBCUTANEOUS | Status: DC
  Administered 2021-07-21: 20 [IU] via SUBCUTANEOUS
  Filled 2021-07-21 (×3): qty 0.2

## 2021-07-21 MED ORDER — INSULIN GLARGINE-YFGN 100 UNIT/ML ~~LOC~~ SOLN
30.0000 [IU] | Freq: Every day | SUBCUTANEOUS | Status: DC
  Administered 2021-07-22 – 2021-07-25 (×4): 30 [IU] via SUBCUTANEOUS
  Filled 2021-07-21 (×6): qty 0.3

## 2021-07-21 MED ORDER — ONDANSETRON HCL 4 MG/2ML IJ SOLN
4.0000 mg | Freq: Once | INTRAMUSCULAR | Status: AC
  Administered 2021-07-21: 4 mg via INTRAVENOUS
  Filled 2021-07-21: qty 2

## 2021-07-21 MED ORDER — INSULIN REGULAR(HUMAN) IN NACL 100-0.9 UT/100ML-% IV SOLN
INTRAVENOUS | Status: DC
  Administered 2021-07-21: 13 [IU]/h via INTRAVENOUS
  Filled 2021-07-21: qty 100

## 2021-07-21 MED ORDER — INSULIN REGULAR(HUMAN) IN NACL 100-0.9 UT/100ML-% IV SOLN
INTRAVENOUS | Status: DC
  Administered 2021-07-21: 8.5 [IU]/h via INTRAVENOUS
  Filled 2021-07-21: qty 100

## 2021-07-21 MED ORDER — LACTATED RINGERS IV BOLUS
1000.0000 mL | INTRAVENOUS | Status: AC
  Administered 2021-07-21 (×2): 1000 mL via INTRAVENOUS

## 2021-07-21 NOTE — ED Provider Notes (Signed)
[facility] EMERGENCY DEPARTMENT Provider Note   CSN: [id number] Arrival date & time: 07/21/21  8938     History  Chief Complaint  Patient presents with   Emesis   Nausea    Mark [name] is a 61 y.o. male.  Patient with a history of diabetes and hypertension here with nausea vomiting abdominal pain.  States symptoms started after eating spaghetti with meatballs 2 nights ago.  Has had persistent vomiting since that has been "dark".  He did throw up some dark red but believes it was fruit punch.  Does not think his emesis of been bloody.  Estimates he has vomited 2 many times to count over the past 2 days and having some left-sided lower abdominal pain.  No diarrhea.  No fever.  No pain with urination or blood in the urine.  No chest pain or shortness of breath.  No travel or sick contacts. Pain is left lower quadrant and worse with palpation.  Believes he is sick from eating new meatballs from the grocery store.  Denies any blood thinner use  The history is provided by the patient.  Emesis Associated symptoms: abdominal pain   Associated symptoms: no arthralgias, no cough, no fever, no headaches and no myalgias        Home Medications Prior to Admission medications   Medication Sig Start Date End Date Taking? Authorizing Provider  ARIPiprazole (ABILIFY) 10 MG tablet Take 10 mg by mouth daily.    [provider]  atorvastatin (LIPITOR) 80 MG tablet Take 80 mg by mouth daily.      buPROPion (WELLBUTRIN XL) 150 MG 24 hr tablet Take 1 tablet (150 mg total) by mouth daily. 04/27/21   Thresa Ross, MD  busPIRone (BUSPAR) 15 MG tablet Take 1 tablet (15 mg total) by mouth 3 (three) times daily. 08/19/19   Arfeen, Phillips Grout, MD  carvedilol (COREG) 25 MG tablet Take 25 mg by mouth 2 (two) times daily with a meal.    [provider]  cyclobenzaprine (FLEXERIL) 10 MG tablet Take 10 mg by mouth daily as needed for muscle spasms. 10/04/20   [provider]  diclofenac Sodium (VOLTAREN) 1 % GEL Apply 2 g topically daily as needed (pain). 10/04/20   [provider]  escitalopram (LEXAPRO) 20 MG tablet Take 20 mg by mouth daily.    [provider]  hydrOXYzine (VISTARIL) 25 MG capsule Take 25-50 mg by mouth daily as needed for anxiety.    [provider]  insulin aspart (NOVOLOG) 100 UNIT/ML injection Inject 35 Units into the skin 3 (three) times daily before meals.    [provider]  insulin glargine (LANTUS) 100 UNIT/ML injection Inject 0.4 mLs (40 Units total) into the skin daily. Patient taking differently: Inject 25-50 Units into the skin daily. Inject 50 units subcutaneous in the morning,  then inject  25 units subcutaneous at night per patient 12/23/19   Joseph Art, DO  lisinopril (ZESTRIL) 40 MG tablet Take 40 mg by mouth daily. 11/09/20   [provider]  oxyCODONE-acetaminophen (PERCOCET/ROXICET) 5-325 MG tablet Take 2 tablets by mouth every 4 (four) hours as needed for severe pain.    [provider]  pioglitazone (ACTOS) 15 MG tablet Take 15 mg by mouth daily.    [provider]  prazosin (MINIPRESS) 2 MG capsule Take 1 capsule (2 mg total) by mouth at bedtime. 03/24/21   Thresa Ross, MD  sildenafil (VIAGRA) 100 MG tablet Take  100 mg by mouth daily as needed for erectile dysfunction.    [provider]  traZODone (DESYREL) 50 MG tablet Take 1 tablet (50 mg total) by mouth at bedtime as needed. Patient taking differently: Take 50 mg by mouth at bedtime as needed for sleep. 08/19/19   Arfeen, Arlyce Harman, MD      Allergies    Ozempic (0.25 or 0.5 mg-dose) [semaglutide(0.25 or 0.5mg -dos)], Semaglutide, and Lyrica [pregabalin]    Review of Systems   Review of Systems  Constitutional:  Positive for activity change and appetite change. Negative for fever.  HENT:  Negative for congestion and rhinorrhea.   Respiratory:  Negative for cough, chest tightness and shortness of  breath.   Cardiovascular:  Negative for chest pain.  Gastrointestinal:  Positive for abdominal pain, nausea and vomiting.  Genitourinary:  Negative for dysuria.  Musculoskeletal:  Negative for arthralgias and myalgias.  Skin:  Negative for rash.  Neurological:  Negative for dizziness, weakness and headaches.   all other systems are negative except as noted in the HPI and PMH.    Physical Exam Updated Vital Signs BP (!) 174/94 (BP Location: Left Arm)   Pulse (!) 119   Temp 98.5 F (36.9 C)   Resp 14   SpO2 90%  Physical Exam Vitals and nursing note reviewed.  Constitutional:      General: He is not in acute distress.    Appearance: He is well-developed. He is ill-appearing.  HENT:     Head: Normocephalic and atraumatic.     Mouth/Throat:     Mouth: Mucous membranes are dry.     Pharynx: No oropharyngeal exudate.  Eyes:     Conjunctiva/sclera: Conjunctivae normal.     Pupils: Pupils are equal, round, and reactive to light.  Neck:     Comments: No meningismus. Cardiovascular:     Rate and Rhythm: Regular rhythm. Tachycardia present.     Heart sounds: Normal heart sounds. No murmur heard. Pulmonary:     Effort: Pulmonary effort is normal. No respiratory distress.     Breath sounds: Normal breath sounds.  Abdominal:     Palpations: Abdomen is soft.     Tenderness: There is abdominal tenderness. There is guarding. There is no rebound.     Comments: LLQ tenderness, no guarding  Musculoskeletal:        General: No tenderness. Normal range of motion.     Cervical back: Normal range of motion and neck supple.     Comments: R AKA  Skin:    General: Skin is warm.  Neurological:     Mental Status: He is alert and oriented to person, place, and time.     Cranial Nerves: No cranial nerve deficit.     Motor: No abnormal muscle tone.     Coordination: Coordination normal.     Comments:  5/5 strength throughout. CN 2-12 intact.Equal grip strength.   Psychiatric:        Behavior:  Behavior normal.     ED Results / Procedures / Treatments   Labs (all labs ordered are listed, but only abnormal results are displayed) Labs Reviewed  COMPREHENSIVE METABOLIC PANEL - Abnormal; Notable for the following components:      Result Value   Chloride 95 (*)    CO2 19 (*)    Glucose, Bld 422 (*)    BUN 25 (*)    Creatinine, Ser 1.48 (*)    Total Bilirubin 1.8 (*)    GFR, Estimated 54 (*)  Anion gap 22 (*)    All other components within normal limits  CBC - Abnormal; Notable for the following components:   WBC 15.7 (*)    All other components within normal limits  URINALYSIS, ROUTINE W REFLEX MICROSCOPIC - Abnormal; Notable for the following components:   Specific Gravity, Urine 1.031 (*)    Glucose, UA >=500 (*)    Hgb urine dipstick SMALL (*)    Ketones, ur 80 (*)    Protein, ur 100 (*)    All other components within normal limits  BETA-HYDROXYBUTYRIC ACID - Abnormal; Notable for the following components:   Beta-Hydroxybutyric Acid 5.77 (*)    All other components within normal limits  LACTIC ACID, PLASMA - Abnormal; Notable for the following components:   Lactic Acid, Venous 2.1 (*)    All other components within normal limits  CBG MONITORING, ED - Abnormal; Notable for the following components:   Glucose-Capillary 410 (*)    All other components within normal limits  I-STAT VENOUS BLOOD GAS, ED - Abnormal; Notable for the following components:   pH, Ven 7.472 (*)    pCO2, Ven 28.2 (*)    pO2, Ven 184 (*)    TCO2 21 (*)    Calcium, Ion 1.08 (*)    All other components within normal limits  CBG MONITORING, ED - Abnormal; Notable for the following components:   Glucose-Capillary 333 (*)    All other components within normal limits  CBG MONITORING, ED - Abnormal; Notable for the following components:   Glucose-Capillary 273 (*)    All other components within normal limits  CBG MONITORING, ED - Abnormal; Notable for the following components:    Glucose-Capillary 248 (*)    All other components within normal limits  CBG MONITORING, ED - Abnormal; Notable for the following components:   Glucose-Capillary 243 (*)    All other components within normal limits  LIPASE, BLOOD  RETICULOCYTES  BETA-HYDROXYBUTYRIC ACID  LACTIC ACID, PLASMA  URINALYSIS, COMPLETE (UACMP) WITH MICROSCOPIC  OCCULT BLOOD GASTRIC / DUODENUM (SPECIMEN CUP)  OSMOLALITY  HIV ANTIBODY (ROUTINE TESTING W REFLEX)  PHOSPHORUS  MAGNESIUM  PROTIME-INR  APTT  HEMOGLOBIN A1C  BASIC METABOLIC PANEL  BASIC METABOLIC PANEL  BASIC METABOLIC PANEL  BASIC METABOLIC PANEL  ETHANOL  SALICYLATE LEVEL  RAPID URINE DRUG SCREEN, HOSP PERFORMED  BETA-HYDROXYBUTYRIC ACID    EKG EKG Interpretation  Date/Time:  Friday July 21 2021 09:10:10 EDT Ventricular Rate:  117 PR Interval:  142 QRS Duration: 90 QT Interval:  326 QTC Calculation: 455 R Axis:   74 Text Interpretation: Sinus tachycardia Low voltage, precordial leads Borderline repol abnormality, diffuse leads No significant change was found Confirmed by Glynn Octave [phone]) on 07/21/2021 9:12:56 AM  Radiology CT ABDOMEN PELVIS W CONTRAST  Result Date: 07/21/2021 CLINICAL DATA:  Acute abdominal pain, nausea, and vomiting for 2 days after eating spicy food, LEFT lower quadrant pain, blood in emesis EXAM: CT ABDOMEN AND PELVIS WITH CONTRAST TECHNIQUE: Multidetector CT imaging of the abdomen and pelvis was performed using the standard protocol following bolus administration of intravenous contrast. RADIATION DOSE REDUCTION: This exam was performed according to the departmental dose-optimization program which includes automated exposure control, adjustment of the mA and/or kV according to patient size and/or use of iterative reconstruction technique. CONTRAST:  56mL OMNIPAQUE IOHEXOL 300 MG/ML  SOLN COMPARISON:  None Available. FINDINGS: Lower chest: 2 mm RIGHT lower lobe nodule image 1, incompletely visualized, present  on previous exam as well. Remaining lung  bases clear. Hepatobiliary: Dependent calculi within gallbladder. Liver unremarkable. Pancreas: Normal appearance Spleen: Normal appearance Adrenals/Urinary Tract: Adrenal glands normal appearance. Tiny BILATERAL renal cysts. No additional renal mass, hydronephrosis, or hydroureter. No urinary tract calcification. Bladder unremarkable. Stomach/Bowel: Normal appendix. Stomach and bowel loops normal appearance. Vascular/Lymphatic: Atherosclerotic calcifications aorta and iliac arteries without aneurysm. No adenopathy. Reproductive: Unremarkable prostate gland and seminal vesicles Other: No free air or free fluid. Tiny umbilical hernia containing fat. Musculoskeletal: Degenerative changes RIGHT hip joint. Minimal sclerosis of T11 vertebral body with minimal anterior height loss unchanged since 12/06/2019, uncertain etiology and significance. IMPRESSION: Cholelithiasis. Tiny umbilical hernia containing fat. Minimal sclerosis of T11 vertebral body with minimal anterior height loss unchanged since 12/06/2019, uncertain etiology and significance; if the patient has any older prior outside imaging which can demonstrate long-term stability, these would be of benefit. No acute intra-abdominal or intrapelvic abnormalities. Aortic Atherosclerosis (ICD10-I70.0). Electronically Signed   By: Ulyses Southward M.D.   On: 07/21/2021 10:02   DG Chest Portable 1 View  Result Date: 07/21/2021 CLINICAL DATA:  Nausea and vomiting EXAM: PORTABLE CHEST 1 VIEW COMPARISON:  Prior chest x-ray 12/04/2019 FINDINGS: The heart size and mediastinal contours are within normal limits. Both lungs are clear. The visualized skeletal structures are unremarkable. IMPRESSION: No active disease. Electronically Signed   By: Malachy Moan M.D.   On: 07/21/2021 08:43    Procedures .Critical Care  Performed by: Glynn Octave, MD Authorized by: Glynn Octave, MD   Critical care provider statement:     Critical care time (minutes):  45   Critical care time was exclusive of:  Separately billable procedures and treating other patients   Critical care was necessary to treat or prevent imminent or life-threatening deterioration of the following conditions:  Endocrine crisis and dehydration   Critical care was time spent personally by me on the following activities:  Development of treatment plan with patient or surrogate, discussions with consultants, evaluation of patient's response to treatment, examination of patient, ordering and review of laboratory studies, ordering and review of radiographic studies, ordering and performing treatments and interventions, pulse oximetry, re-evaluation of patient's condition and review of old charts   I assumed direction of critical care for this patient from another provider in my specialty: no     Care discussed with: admitting provider       Medications Ordered in ED Medications  insulin regular, human (MYXREDLIN) 100 units/ 100 mL infusion (has no administration in time range)  lactated ringers infusion (has no administration in time range)  dextrose 5 % in lactated ringers infusion (has no administration in time range)  dextrose 50 % solution 0-50 mL (has no administration in time range)  lactated ringers bolus 1,000 mL (has no administration in time range)  potassium chloride 10 mEq in 100 mL IVPB (has no administration in time range)  ondansetron (ZOFRAN) injection 4 mg (has no administration in time range)  ondansetron (ZOFRAN-ODT) disintegrating tablet 4 mg (4 mg Oral Given 07/21/21 0533)    ED Course/ Medical Decision Making/ A&P                           Medical Decision Making Amount and/or Complexity of Data Reviewed Labs: ordered. Decision-making details documented in ED Course. Radiology: ordered and independent interpretation performed. Decision-making details documented in ED Course. ECG/medicine tests: ordered and independent  interpretation performed. Decision-making details documented in ED Course.  Risk Prescription drug management. Decision regarding hospitalization.  Nausea vomiting abdominal pain after eating spaghetti 2 nights ago.  Tachycardic and dry appearing on arrival.  IV access established.  Abdomen soft without peritoneal signs.  Labs in triage is concerning for DKA/HHS.  Anion gap is 22.  Patient has AKI blood sugar 422.  Will initiate IV hydration and IV insulin.  Lactic acidosis noted, elevated beta hydroxybutyrate.  Patient given IV fluids and IV insulin.  Large ketones in urine.  No vomiting throughout ED course.  Abdominal imaging is obtained and reassuring without acute surgical pathology.  Does have cholelithiasis.  No evidence of cholecystitis.  No bowel obstruction.  Results reviewed and interpreted by me.  Heart rate improving was still tachycardic in the 110s.  Additional IV fluids are given.  No vomiting throughout ED course.  Admission for DKA discussed with Dr. Marianna Payment.        Final Clinical Impression(s) / ED Diagnoses Final diagnoses:  Diabetic ketoacidosis without coma associated with other specified diabetes mellitus ([place])  Nausea and vomiting, unspecified vomiting type    Rx / DC Orders ED Discharge Orders     None         Jacques Willingham, Annie Main, MD 07/21/21 1355

## 2021-07-21 NOTE — ED Notes (Signed)
Patient transported to CT 

## 2021-07-21 NOTE — ED Notes (Signed)
Dr. Evlyn Kanner reasoned to secure chat

## 2021-07-21 NOTE — H&P (Addendum)
Date: 07/21/2021     Patient Name:  Mark [name] MRN: [id number]  DOB: January 03, 1961 Age / Sex: 61 y.o., male   PCP: [name]         Medical Service: Internal Medicine Teaching Service       Attending Physician: Dr. Reymundo Poll, MD    Intern (1st Contact): Champ Mungo, DO Pager: ED [phone]  Resident (2nd Contact): Salena Saner, MD Pager: Turner Daniels [phone]       After Hours (After 5p/  First Contact Pager: [phone]  weekends / holidays): Second Contact Pager: [phone]   SUBJECTIVE:  Chief Complaint: Nausea and Emesis   History of Present Illness: Mark [name] is a functionally independent 61 y.o. male with a pertinent PMH of T2DM, HLD, HTN, PTSD and GAD, who presents to [facility] with a 3-day history of nausea and vomiting.  Patient states that his symptoms started on Tuesday when he made some spaghetti.  Shortly afterward he developed generalized abdominal pain with associated nausea and vomiting.  Patient states that his symptoms progressed over the course of the week.  He states that he felt too weak to get out of bed and therefore, did not take any of his insulin.  Patient did endorse having some dark emesis. But denies a history of hematemesis, ETOH use disorder, cirrhosis or PUD. He has noticed some associated polydipsia and polyuria without hematuria, dysuria, or urinary frequency. He denies any systemic signs or symptoms of infection including fever, chills, or myalgias.  Furthermore, he denies any chest pain, palpitations, shortness of breath or presyncopal symptoms.  He denies alcohol use, but he does occasionally use cannabis.  Otherwise no illicit drug use.  In the ED, the patient was found to have significant anion gap metabolic acidosis with positive beta hydroxybutyrate and urine ketones.  CT abdomen pelvis was within normal limits.  Medications: No current facility-administered medications on file prior to encounter.   Current Outpatient Medications on File Prior to  Encounter  Medication Sig Dispense Refill   ARIPiprazole (ABILIFY) 10 MG tablet Take 10 mg by mouth daily.     atorvastatin (LIPITOR) 80 MG tablet Take 80 mg by mouth daily.     buPROPion (WELLBUTRIN XL) 150 MG 24 hr tablet Take 1 tablet (150 mg total) by mouth daily. 30 tablet 0   busPIRone (BUSPAR) 15 MG tablet Take 1 tablet (15 mg total) by mouth 3 (three) times daily. 90 tablet 2   carvedilol (COREG) 25 MG tablet Take 25 mg by mouth 2 (two) times daily with a meal.     cyclobenzaprine (FLEXERIL) 10 MG tablet Take 10 mg by mouth daily as needed for muscle spasms.     diclofenac Sodium (VOLTAREN) 1 % GEL Apply 2 g topically daily as needed (pain).     escitalopram (LEXAPRO) 20 MG tablet Take 20 mg by mouth daily.     hydrOXYzine (VISTARIL) 25 MG capsule Take 25-50 mg by mouth daily as needed for anxiety.     insulin aspart (NOVOLOG) 100 UNIT/ML injection Inject 35 Units into the skin 3 (three) times daily before meals.     insulin glargine (LANTUS) 100 UNIT/ML injection Inject 0.4 mLs (40 Units total) into the skin daily. (Patient taking differently: Inject 25-50 Units into the skin daily. Inject 50 units subcutaneous in the morning,  then inject  25 units subcutaneous at night per patient) 10 mL 11   lisinopril (ZESTRIL) 40 MG tablet Take 40 mg by mouth daily.  oxyCODONE-acetaminophen (PERCOCET/ROXICET) 5-325 MG tablet Take 2 tablets by mouth every 4 (four) hours as needed for severe pain.     pioglitazone (ACTOS) 15 MG tablet Take 15 mg by mouth daily.     prazosin (MINIPRESS) 2 MG capsule Take 1 capsule (2 mg total) by mouth at bedtime. 30 capsule 2   sildenafil (VIAGRA) 100 MG tablet Take 100 mg by mouth daily as needed for erectile dysfunction.     traZODone (DESYREL) 50 MG tablet Take 1 tablet (50 mg total) by mouth at bedtime as needed. (Patient taking differently: Take 50 mg by mouth at bedtime as needed for sleep.) 30 tablet 2    Past Medical History: Past Medical History:   Diagnosis Date   Above knee amputation status, right    Anxiety    Depression    Diabetes mellitus, type II ([place])    Hyperlipemia    Hypertension    PTSD (post-traumatic stress disorder)    Shoulder pain     Social:  Lives - [address] [place],  [place] [address] - lives alone Support - None, lives alone.  Level of function: Independent Occupation - disability PCP - [facility] Substance use: Nonprescription/Illicit -  cannibis   ETOH -  occasionally drinks ETOH, denies withdrawal in the past Tobacco -  Former tobacco use, 20 pack year history   Family History: Family History  Problem Relation Age of Onset   Aneurysm Mother    Leukemia Father    Dementia Maternal Aunt    Seizures Paternal Uncle     Allergies: Allergies as of 07/21/2021 - Review Complete 07/21/2021  Allergen Reaction Noted   Ozempic (0.25 or 0.5 mg-dose) [semaglutide(0.25 or 0.5mg -dos)] Other (See Comments) 05/22/2018   Semaglutide Other (See Comments) 11/05/2018   Lyrica [pregabalin] Rash 06/08/2016    Review of Systems: A complete ROS was negative except as per HPI.   OBJECTIVE:  Physical Exam: Blood pressure (!) 195/90, pulse (!) 104, temperature 98.5 F (36.9 C), resp. rate (!) 24, height 6\' 2"  (1.88 m), weight 131.5 kg, SpO2 91 %. Physical Exam Constitutional:      Appearance: Normal appearance. He is obese. He is not ill-appearing.  HENT:     Head: Normocephalic and atraumatic.  Eyes:     Extraocular Movements: Extraocular movements intact.  Cardiovascular:     Rate and Rhythm: Tachycardia present.     Pulses: Normal pulses.     Heart sounds: Normal heart sounds.  Pulmonary:     Effort: Pulmonary effort is normal.     Breath sounds: Normal breath sounds.  Abdominal:     General: Bowel sounds are normal. There is no distension.     Palpations: Abdomen is soft.     Tenderness: There is abdominal tenderness (generalized).  Musculoskeletal:        General: Deformity  (right leg amputation) present. Normal range of motion.     Cervical back: Normal range of motion.     Right lower leg: No edema.     Left lower leg: No edema.  Skin:    General: Skin is warm and dry.  Neurological:     Mental Status: He is alert and oriented to person, place, and time. Mental status is at baseline.  Psychiatric:        Mood and Affect: Mood normal.     Pertinent Labs: CBC    Component Value Date/Time   WBC 15.7 (H) 07/21/2021 0544   RBC 5.20 07/21/2021 0544  HGB 15.3 07/21/2021 0840   HCT 45.0 07/21/2021 0840   PLT 291 07/21/2021 0544   MCV 85.0 07/21/2021 0544   MCH 27.5 07/21/2021 0544   MCHC 32.4 07/21/2021 0544   RDW 14.3 07/21/2021 0544   LYMPHSABS 1.5 11/30/2020 1627   MONOABS 1.2 (H) 11/30/2020 1627   EOSABS 0.2 11/30/2020 1627   BASOSABS 0.0 11/30/2020 1627     CMP     Component Value Date/Time   NA 135 07/21/2021 0840   K 4.3 07/21/2021 0840   CL 95 (L) 07/21/2021 0544   CO2 19 (L) 07/21/2021 0544   GLUCOSE 422 (H) 07/21/2021 0544   BUN 25 (H) 07/21/2021 0544   CREATININE 1.48 (H) 07/21/2021 0544   CALCIUM 9.3 07/21/2021 0544   PROT 7.7 07/21/2021 0544   ALBUMIN 3.6 07/21/2021 0544   AST 15 07/21/2021 0544   ALT 16 07/21/2021 0544   ALKPHOS 111 07/21/2021 0544   BILITOT 1.8 (H) 07/21/2021 0544   GFRNONAA 54 (L) 07/21/2021 0544   GFRAA >60 12/26/2017 2204   Lipase     Component Value Date/Time   LIPASE 40 07/21/2021 0544   Lab Results  Component Value Date   PHVEN 7.472 (H) 07/21/2021   Lab Results  Component Value Date   BHYDRXBUT 5.77 (H) 07/21/2021   Lab Results  Component Value Date   LATICACIDVEN 2.1 (HH) 07/21/2021   . Lab Results  Component Value Date   COLORURINE YELLOW 07/21/2021   LABSPEC 1.031 (H) 07/21/2021   PHURINE 5.0 07/21/2021   GLUCOSEU >=500 (A) 07/21/2021   HGBUR SMALL (A) 07/21/2021   BILIRUBINUR NEGATIVE 07/21/2021   PROTEINUR 100 (A) 07/21/2021   NITRITE NEGATIVE 07/21/2021   LEUKOCYTESUR  NEGATIVE 07/21/2021   RBCU 0-5 07/21/2021   WBCU 0-5 07/21/2021   BACTERIA NONE SEEN 07/21/2021   EPIU 0-5 07/21/2021   MUCUSUACOMP PRESENT 07/21/2021    Pertinent Imaging: CT ABDOMEN PELVIS W CONTRAST Result Date: 07/21/2021 IMPRESSION: Cholelithiasis. Tiny umbilical hernia containing fat. Minimal sclerosis of T11 vertebral body with minimal anterior height loss unchanged since 12/06/2019, uncertain etiology and significance; if the patient has any older prior outside imaging which can demonstrate long-term stability, these would be of benefit. No acute intra-abdominal or intrapelvic abnormalities. Aortic Atherosclerosis (ICD10-I70.0).    DG Chest Portable 1 View  Result Date: 07/21/2021 CLINICAL DATA:  Nausea and vomiting EXAM: PORTABLE CHEST 1 VIEW COMPARISON:  Prior chest x-ray 12/04/2019 FINDINGS: The heart size and mediastinal contours are within normal limits. Both lungs are clear. The visualized skeletal structures are unremarkable. IMPRESSION: No active disease. Electronically Signed   By: Malachy Moan M.D.   On: 07/21/2021 08:43    EKG: normal EKG, normal sinus rhythm  ASSESSMENT & PLAN:  Patient Summary: Mark [name] is a functionally independent 61 y.o. male with a pertinent PMH of T2DM, HLD, HTN, PTSD and GAD, who presents to [facility] with a 3-day history of nausea and vomiting and admit for HHS.   Assessment: Principal Problem:   DKA (diabetic ketoacidosis) (HCC)   Plan:  DKA Type II DM, insulin dependent  Patient presents with likely DKA in the setting of an acute GI illness complicated by decreased p.o. intake and lack of insulin therapy.  Patient has a VBG showing respiratory alkalosis with metabolic panel showing a significant anion gap metabolic acidosis.  He has a significantly elevated beta hydroxybutyrate with urinalysis showing urine ketones.  Patiently has some mild lactic acidosis of roughly 2.0.  He denies any significant  precipitating factors including  systemic infection, ACS, illicit drugs, or alcohol use.  Additionally, had a normal lipase and a CT abdomen pelvis that was within normal limits. Patient was started on IV insulin therapy with appropriate fluid resuscitation.  We will keep patient n.p.o. at this time and increase diet as tolerable. -Continue IV fluids -Continue IV insulin - BMP q4 hours  -Goal K > 4, Mg > 2 - EKG reassuraing - UDS pending - Osmolality pending   Acute Kidney Injury: Patient presents with a significant bump in his creatinine from a baseline of roughly 1.03-1.48 with a new low GFR of 54.  This is in the setting of osmotic diuresis from DKA/HHS.  BUN/creatinine ratio of roughly 15 suggest intrarenal etiology.  Urinalysis shows concentrated specific gravity.  Patient likely has prerenal azotemia but high risk for ATN.  We will continue to fluid replete and monitor kidney function daily. -Continue fluid resuscitation -Monitor kidney function electrolytes daily -Avoid nephrotoxic medications when possible.  Nausea/Emesis: Patient presented with concern for dark-colored emesis concerning for hematemesis.  Does have a history of anemia which is within the normal range during this admission.  That is likely due to hemoconcentration.  No obvious signs or symptoms of upper GI bleed at this time. -Monitor for signs and symptoms of hematemesis -Trend hemoglobin levels - Zofran prn for nausea -Continue SCDs transition to pharmacologic VTE prophylaxis when hematemesis ruled out  AGMA: Patient presents with a fairly significant gap metabolic acidosis.  This is likely secondary to DKA/HHS in the setting of elevated beta hydroxybutyrate acid.  Lactic acid is mildly elevated as well. -Salicylates -EtOH pending -UDS pending -Mildly pending  HTN: We will hold antihypertensive medications at this time.  PTSD/general anxiety disorder: Patient on multiple behavioral health medications.  We will restart these medications  tomorrow pending med rec.   Best Practice: Diet: NPO IVF: LR, VTE: SCDs Start: 07/21/21 1105SCD Code: Full Code AB: none Dispo: Inpatient with expected length of stay greater than 2 midnights. Anticipated Discharge Location: Home Barriers to Discharge: Medical stability Family Contact: none  Signature: Lawerance Cruel, D.O.  Internal Medicine Resident, PGY-3 [facility]  Pager #: [phone] (If no response, contact on-call pager) 12:10 PM, 07/21/2021   Please contact the on-call pager after 5 pm and on weekends at [phone].

## 2021-07-21 NOTE — ED Notes (Signed)
Provider notified through secure chat that the patient's BG is 191

## 2021-07-21 NOTE — Inpatient Diabetes Management (Addendum)
Inpatient Diabetes Program Recommendations  AACE/ADA: New Consensus Statement on Inpatient Glycemic Control (2015)  Target Ranges:  Prepandial:   less than 140 mg/dL      Peak postprandial:   less than 180 mg/dL (1-2 hours)      Critically ill patients:  140 - 180 mg/dL   Lab Results  Component Value Date   GLUCAP 221 (H) 07/21/2021   HGBA1C 9.9 (H) 10/15/2020    Review of Glycemic Control  Diabetes history: DM 2 Outpatient Diabetes medications: Lantus 50 units qam, 25 units qpm, Novolog 35 units tid Current orders for Inpatient glycemic control:  IV insulin/Endotool   At time of transition consider: -  Semglee 40 units qam, 20 units qpm -  Novolog 0-15 units tid + hs -  Novolog 10 units tid meal coverage if eating >50% of meals and glucose is at least 80 mg/dl.   Spoke with pt at bedside regarding glucose control at home. Discussed DKA with pt and why it occurs. Pt reports compliance with medication and glucose checks at home. Pt has a Freestyle Libre 2 at home and reports within the last 2 weeks his glucose trends have been in the mid 100 range, however prior to that his glucose trends were more elevated. Pt reports seeing an Endocrinologist at the [place]. Pt reports seeing his doctor every 6 months, however, he stays in close contact with the MD through the health portal messaging system. Pt stated that his A1c in May was around a 10%. Pt will follow up with Endocrinologist. Discussed the need for insulin when sick.  Thanks,  Christena Deem RN, MSN, BC-ADM Inpatient Diabetes Coordinator Team Pager [phone] (8a-5p)

## 2021-07-21 NOTE — ED Triage Notes (Addendum)
BIB [facility] after pt called to report on going N/V x 2 days. Pt reports that two days ago, pt ate spicy food and has vomited since then. PT endorses LLQ pain and blood in emesis.   HX: DM, R BKA

## 2021-07-22 LAB — COMPREHENSIVE METABOLIC PANEL
ALT: 14 U/L (ref 0–44)
AST: 14 U/L — ABNORMAL LOW (ref 15–41)
Albumin: 3.1 g/dL — ABNORMAL LOW (ref 3.5–5.0)
Alkaline Phosphatase: 85 U/L (ref 38–126)
Anion gap: 14 (ref 5–15)
BUN: 19 mg/dL (ref 6–20)
CO2: 23 mmol/L (ref 22–32)
Calcium: 8.8 mg/dL — ABNORMAL LOW (ref 8.9–10.3)
Chloride: 100 mmol/L (ref 98–111)
Creatinine, Ser: 1.12 mg/dL (ref 0.61–1.24)
GFR, Estimated: >60 mL/min (ref >60)
Glucose, Bld: 276 mg/dL — ABNORMAL HIGH (ref 70–99)
Potassium: 3.9 mmol/L (ref 3.5–5.1)
Sodium: 137 mmol/L (ref 135–145)
Total Bilirubin: 1.3 mg/dL — ABNORMAL HIGH (ref 0.3–1.2)
Total Protein: 6.4 g/dL — ABNORMAL LOW (ref 6.5–8.1)

## 2021-07-22 LAB — CBC
HCT: 39 % (ref 39.0–52.0)
Hemoglobin: 13.1 g/dL (ref 13.0–17.0)
MCH: 28.2 pg (ref 26.0–34.0)
MCHC: 33.6 g/dL (ref 30.0–36.0)
MCV: 83.9 fL (ref 80.0–100.0)
Platelets: 193 10*3/uL (ref 150–400)
RBC: 4.65 MIL/uL (ref 4.22–5.81)
RDW: 14.1 % (ref 11.5–15.5)
WBC: 11.9 10*3/uL — ABNORMAL HIGH (ref 4.0–10.5)
nRBC: 0 % (ref 0.0–0.2)

## 2021-07-22 LAB — RAPID URINE DRUG SCREEN, HOSP PERFORMED
Amphetamines: NOT DETECTED
Barbiturates: NOT DETECTED
Benzodiazepines: NOT DETECTED
Cocaine: NOT DETECTED
Opiates: NOT DETECTED
Tetrahydrocannabinol: POSITIVE — AB

## 2021-07-22 LAB — GLUCOSE, CAPILLARY
Glucose-Capillary: 323 mg/dL — ABNORMAL HIGH (ref 70–99)
Glucose-Capillary: 257 mg/dL — ABNORMAL HIGH (ref 70–99)
Glucose-Capillary: 81 mg/dL (ref 70–99)
Glucose-Capillary: 149 mg/dL — ABNORMAL HIGH (ref 70–99)
Glucose-Capillary: 174 mg/dL — ABNORMAL HIGH (ref 70–99)

## 2021-07-22 LAB — BASIC METABOLIC PANEL
Anion gap: 8 (ref 5–15)
BUN: 15 mg/dL (ref 6–20)
CO2: 25 mmol/L (ref 22–32)
Calcium: 8.7 mg/dL — ABNORMAL LOW (ref 8.9–10.3)
Chloride: 105 mmol/L (ref 98–111)
Creatinine, Ser: 0.94 mg/dL (ref 0.61–1.24)
GFR, Estimated: >60 mL/min (ref >60)
Glucose, Bld: 82 mg/dL (ref 70–99)
Potassium: 3.6 mmol/L (ref 3.5–5.1)
Sodium: 138 mmol/L (ref 135–145)

## 2021-07-22 LAB — HEMOGLOBIN A1C
Hgb A1c MFr Bld: 10 % — ABNORMAL HIGH (ref 4.8–5.6)
Mean Plasma Glucose: 240 mg/dL

## 2021-07-22 MED ORDER — POLYETHYLENE GLYCOL 3350 17 G PO PACK
17.0000 g | PACK | Freq: Every day | ORAL | Status: DC | PRN
  Administered 2021-07-22: 17 g via ORAL
  Filled 2021-07-22: qty 1

## 2021-07-22 MED ORDER — INSULIN ASPART 100 UNIT/ML IJ SOLN
10.0000 [IU] | Freq: Three times a day (TID) | INTRAMUSCULAR | Status: DC
  Administered 2021-07-22 – 2021-07-23 (×2): 10 [IU] via SUBCUTANEOUS

## 2021-07-22 MED ORDER — INSULIN GLARGINE-YFGN 100 UNIT/ML ~~LOC~~ SOLN
30.0000 [IU] | Freq: Every day | SUBCUTANEOUS | Status: DC
  Administered 2021-07-22 – 2021-07-24 (×2): 30 [IU] via SUBCUTANEOUS
  Filled 2021-07-22 (×4): qty 0.3

## 2021-07-22 MED ORDER — SENNOSIDES-DOCUSATE SODIUM 8.6-50 MG PO TABS
1.0000 | ORAL_TABLET | Freq: Every day | ORAL | Status: DC
  Filled 2021-07-22: qty 1

## 2021-07-22 MED ORDER — KETOROLAC TROMETHAMINE 15 MG/ML IJ SOLN
15.0000 mg | Freq: Once | INTRAMUSCULAR | Status: AC
  Administered 2021-07-22: 15 mg via INTRAVENOUS
  Filled 2021-07-22: qty 1

## 2021-07-22 MED ORDER — LACTATED RINGERS IV SOLN
INTRAVENOUS | Status: AC
Start: 2021-07-22 — End: 2021-07-23

## 2021-07-22 NOTE — Progress Notes (Signed)
Subjective:   Summary: Mark [name] is a 61 y.o. year old male currently admitted on the IMTS HD#1 for DKA.  Overnight Events: NAEON -continues to have LUQ abdominal discomfort, distention -endorses n/v and mentions zofran helps. Reports that he vomited partially digested food -has not had a bowel movement in 3+ days  Objective:  Vital signs in last 24 hours: Vitals:   07/21/21 1838 07/21/21 1940 07/22/21 0000 07/22/21 0450  BP: (!) 173/91 (!) 167/67 (!) 149/63 (!) 163/76  Pulse: 100 97 (!) 110 (!) 110  Resp: $Remo'19 14 16 20  'RMJoQ$ Temp: 97.9 F (36.6 C) 97.8 F (36.6 C) 97.8 F (36.6 C) 97.9 F (36.6 C)  TempSrc: Oral Oral Oral Oral  SpO2: 93% 91% 93% 96%  Weight:      Height:       Supplemental O2:  SpO2: 96 % O2 Flow Rate (L/min): 2 L/min   Physical Exam:  Constitutional:  male sitting uncomfortably in bed, in no acute distress, interactive and answers questions Cardiovascular: RRR, no murmurs, rubs or gallops Pulmonary/Chest: normal work of breathing on room air on time of examination Abdominal: distended, tender to palpation in LUQ/epigstrum Skin: warm and dry, decreased skin turgor Extremities: Above knee amputation of R leg, upper/lower extremity pulses 2+, no lower extremity edema present. Healing ulceration of sole of L second toe.   Filed Weights   07/21/21 1115  Weight: 131.5 kg     Intake/Output Summary (Last 24 hours) at 07/22/2021 0747 Last data filed at 07/22/2021 0000 Gross per 24 hour  Intake 3605.68 ml  Output 650 ml  Net 2955.68 ml   Net IO Since Admission: 2,955.68 mL [07/22/21 0747]  Pertinent Labs:    Latest Ref Rng & Units 07/22/2021    2:10 AM 07/21/2021    8:40 AM 07/21/2021    5:44 AM  CBC  WBC 4.0 - 10.5 K/uL 11.9   15.7   Hemoglobin 13.0 - 17.0 g/dL 13.1  15.3  14.3   Hematocrit 39.0 - 52.0 % 39.0  45.0  44.2   Platelets 150 - 400 K/uL 193   291        Latest Ref Rng & Units 07/22/2021    2:10 AM 07/21/2021    5:40  PM 07/21/2021    1:34 PM  CMP  Glucose 70 - 99 mg/dL 276  194  188   BUN 6 - 20 mg/dL $Remove'19  22  20   'TiEiFVy$ Creatinine 0.61 - 1.24 mg/dL 1.12  1.10  1.16   Sodium 135 - 145 mmol/L 137  139  138   Potassium 3.5 - 5.1 mmol/L 3.9  4.6  3.9   Chloride 98 - 111 mmol/L 100  102  100   CO2 22 - 32 mmol/L $RemoveB'23  28  26   'YjCnosdr$ Calcium 8.9 - 10.3 mg/dL 8.8  9.0  9.3   Total Protein 6.5 - 8.1 g/dL 6.4     Total Bilirubin 0.3 - 1.2 mg/dL 1.3     Alkaline Phos 38 - 126 U/L 85     AST 15 - 41 U/L 14     ALT 0 - 44 U/L 14       Imaging: CT ABDOMEN PELVIS W CONTRAST  Result Date: 07/21/2021 CLINICAL DATA:  Acute abdominal pain, nausea, and vomiting for 2 days after eating spicy food, LEFT lower quadrant pain, blood in emesis EXAM: CT ABDOMEN  AND PELVIS WITH CONTRAST TECHNIQUE: Multidetector CT imaging of the abdomen and pelvis was performed using the standard protocol following bolus administration of intravenous contrast. RADIATION DOSE REDUCTION: This exam was performed according to the departmental dose-optimization program which includes automated exposure control, adjustment of the mA and/or kV according to patient size and/or use of iterative reconstruction technique. CONTRAST:  36mL OMNIPAQUE IOHEXOL 300 MG/ML  SOLN COMPARISON:  None Available. FINDINGS: Lower chest: 2 mm RIGHT lower lobe nodule image 1, incompletely visualized, present on previous exam as well. Remaining lung bases clear. Hepatobiliary: Dependent calculi within gallbladder. Liver unremarkable. Pancreas: Normal appearance Spleen: Normal appearance Adrenals/Urinary Tract: Adrenal glands normal appearance. Tiny BILATERAL renal cysts. No additional renal mass, hydronephrosis, or hydroureter. No urinary tract calcification. Bladder unremarkable. Stomach/Bowel: Normal appendix. Stomach and bowel loops normal appearance. Vascular/Lymphatic: Atherosclerotic calcifications aorta and iliac arteries without aneurysm. No adenopathy. Reproductive: Unremarkable  prostate gland and seminal vesicles Other: No free air or free fluid. Tiny umbilical hernia containing fat. Musculoskeletal: Degenerative changes RIGHT hip joint. Minimal sclerosis of T11 vertebral body with minimal anterior height loss unchanged since 38/10/1749, uncertain etiology and significance. IMPRESSION: Cholelithiasis. Tiny umbilical hernia containing fat. Minimal sclerosis of T11 vertebral body with minimal anterior height loss unchanged since 02/58/5277, uncertain etiology and significance; if the patient has any older prior outside imaging which can demonstrate long-term stability, these would be of benefit. No acute intra-abdominal or intrapelvic abnormalities. Aortic Atherosclerosis (ICD10-I70.0). Electronically Signed   By: Lavonia Dana M.D.   On: 07/21/2021 10:02   DG Chest Portable 1 View  Result Date: 07/21/2021 CLINICAL DATA:  Nausea and vomiting EXAM: PORTABLE CHEST 1 VIEW COMPARISON:  Prior chest x-ray 12/04/2019 FINDINGS: The heart size and mediastinal contours are within normal limits. Both lungs are clear. The visualized skeletal structures are unremarkable. IMPRESSION: No active disease. Electronically Signed   By: Jacqulynn Cadet M.D.   On: 07/21/2021 08:43    EKG Shows sinus tachycardia without signs of prolonged QT interval  Assessment/Plan:   Principal Problem:   DKA (diabetic ketoacidosis) (HCC) T2DM Active Problems: AKI Anxiety Depression PTSD  Patient Summary: Mark [name] is a 61 y.o. with a pertinent PMH of R above knee amputation, HTN, HLD, T2DM, and multiple psychiatric diagnoses, who presented with nausea, vomiting, and abdominal pain and admitted for DKA.    #DKA with history of T2DM #GI foodborne illness with nausea/vomiting (resolving) #dehydration (resolving) Patient described likely GI foodborne illness, associated dehydration, and cessation of diabetes treatments as precipitating factors for DKA. Urine glucose >500 and urine ketones >80 on intake  with bicarb 19, anion gap 22, beta hydroxybutyrate 5.77 and serum osmolality 305 overall consistent with DKA over HHS. Anion gap closed on insulin drip and transitioned to subQ last night. Only received 20 units Semglee as of this AM, ON serum glucose 276 and AM 323 so need to increase insulin dose (home dose is lantus 50u qAM/25u QHS and novolog 35 with meals). Patient met with diabetes coordinator yesterday to discuss treatment. A1c yesterday 10.0 compared with 9.9 9 months ago. N/V continues with description of undigested food in vomit pormpting suspicion for possibly gastroparesis in the setting of undertreated T2DM. Abdominal tenderness in LUQ/epigastrium might reflect possible gastritis in setting of foodborne illness vs. PUD. Plan: -change long-acting insulin to Semglee 30u qAM/30u QHS -add novolog 10 units TID with meals in addition to novolog 0-20 units SSI and 0-5 units SSI QHS -CTM glucose, trend BMP -continue LR 178mL/hr for 10 hours -continue zofran  Q4 PRN for n/v -Tylenol $RemoveBefo'650mg'OaHnVHdRvLc$  q6 PRN for pain -consider reglan for gastric motility if warranted  #non-oliguric AKI (resolved) -continue to monitor Cr while giving fluids  #Anxiety #Depression #PTSD Continue prazosin $RemoveBeforeDEI'2mg'eKYMrdBRDQbEPSJU$  daily, trazodone 50 mg QHS  #constipation Patient reports no bowel movement for 3+ with abdominal distention and tenderness Plan: -start bowel regimen  Diet: Carb-Modified IVF: LR,100cc/hr VTE: SCDs Code: Full PT/OT recs: None, none. TOC recs:    Dispo: Anticipated discharge to Home in 2 days pending resolution of abdominal pain/tenderness.   Linus Galas, MD PGY-1 Internal Medicine Resident Please contact the on call pager after 5 pm and on weekends at [phone].

## 2021-07-23 DIAGNOSIS — R112 Nausea with vomiting, unspecified: Secondary | ICD-10-CM

## 2021-07-23 DIAGNOSIS — K859 Acute pancreatitis without necrosis or infection, unspecified: Secondary | ICD-10-CM | POA: Clinically undetermined

## 2021-07-23 DIAGNOSIS — R1012 Left upper quadrant pain: Secondary | ICD-10-CM

## 2021-07-23 LAB — BASIC METABOLIC PANEL
Anion gap: 12 (ref 5–15)
BUN: 18 mg/dL (ref 6–20)
CO2: 24 mmol/L (ref 22–32)
Calcium: 8.6 mg/dL — ABNORMAL LOW (ref 8.9–10.3)
Chloride: 100 mmol/L (ref 98–111)
Creatinine, Ser: 1.12 mg/dL (ref 0.61–1.24)
GFR, Estimated: >60 mL/min (ref >60)
Glucose, Bld: 139 mg/dL — ABNORMAL HIGH (ref 70–99)
Potassium: 3.4 mmol/L — ABNORMAL LOW (ref 3.5–5.1)
Sodium: 136 mmol/L (ref 135–145)

## 2021-07-23 LAB — GLUCOSE, CAPILLARY
Glucose-Capillary: 275 mg/dL — ABNORMAL HIGH (ref 70–99)
Glucose-Capillary: 326 mg/dL — ABNORMAL HIGH (ref 70–99)
Glucose-Capillary: 139 mg/dL — ABNORMAL HIGH (ref 70–99)
Glucose-Capillary: 71 mg/dL (ref 70–99)

## 2021-07-23 LAB — LIPASE, BLOOD: Lipase: 92 U/L — ABNORMAL HIGH (ref 11–51)

## 2021-07-23 MED ORDER — OXYCODONE-ACETAMINOPHEN 5-325 MG PO TABS
1.0000 | ORAL_TABLET | Freq: Three times a day (TID) | ORAL | Status: DC | PRN
  Administered 2021-07-23 – 2021-07-24 (×3): 1 via ORAL
  Filled 2021-07-23 (×3): qty 1

## 2021-07-23 MED ORDER — POTASSIUM CHLORIDE 20 MEQ PO PACK: 20.0000 meq | PACK | Freq: Two times a day (BID) | ORAL | Status: DC

## 2021-07-23 MED ORDER — SIMETHICONE 80 MG PO CHEW
80.0000 mg | CHEWABLE_TABLET | Freq: Four times a day (QID) | ORAL | Status: DC | PRN
Start: 2021-07-23 — End: 2021-07-25
  Administered 2021-07-25: 80 mg via ORAL
  Filled 2021-07-23: qty 1

## 2021-07-23 MED ORDER — INSULIN ASPART 100 UNIT/ML IJ SOLN
20.0000 [IU] | Freq: Three times a day (TID) | INTRAMUSCULAR | Status: DC
Start: 2021-07-23 — End: 2021-07-24
  Administered 2021-07-23 – 2021-07-24 (×2): 20 [IU] via SUBCUTANEOUS

## 2021-07-23 MED ORDER — LACTATED RINGERS IV SOLN: INTRAVENOUS | Status: DC

## 2021-07-23 MED ORDER — POLYETHYLENE GLYCOL 3350 17 G PO PACK
17.0000 g | PACK | Freq: Every day | ORAL | Status: DC
  Filled 2021-07-23 (×2): qty 1

## 2021-07-23 MED ORDER — POTASSIUM CHLORIDE 20 MEQ PO PACK
40.0000 meq | PACK | Freq: Two times a day (BID) | ORAL | Status: DC
  Administered 2021-07-23: 40 meq via ORAL
  Filled 2021-07-23: qty 2

## 2021-07-23 NOTE — Progress Notes (Signed)
Subjective:   Summary: Mark [name] is a 61 y.o. year old male currently admitted on the IMTS HD#2 for DKA.  Overnight Events: -LUQ abd pain toradol given, miralax, mentioned the miralax didn't help with pain and toradol relieved from 9/10 to 8/10. He feels like this pain is similar to a prior episode of pancreatitis. We discussed other possibilities as well including gastritis, distention, and gastroparesis and he is amenable to diagnostic and treatment options -BG 82 yesterday, short acting held, BG 139 this AM after Semglee30u -mentions he had two soft bowel movements after miralax -nausea improved with zofran, able to eat dinner last night and breakfast this morning     Objective:  Vital signs in last 24 hours: Vitals:   07/22/21 2015 07/22/21 2326 07/23/21 0335 07/23/21 0500  BP: 114/60 131/61 (!) 160/77   Pulse: (!) 102 (!) 104 85   Resp: 20 20 20    Temp:  97.8 F (36.6 C) 98.2 F (36.8 C)   TempSrc:  Oral Oral   SpO2: 93% 90% 96%   Weight:    135.5 kg  Height:       Supplemental O2: Room Air SpO2: 96 % O2 Flow Rate (L/min): 4 L/min   Physical Exam:  Constitutional: appears uncomfortable when moving, but improved from yesterday sitting in bed, in no acute distress. Interactive and answers questions Cardiovascular: RRR, no murmurs, rubs or gallops Pulmonary/Chest: normal work of breathing on room air, lungs clear to auscultation bilaterally Abdominal: soft, tenderness to palpation ins LUQ/epigastric, mildly distended Skin: warm and dry Extremities: Above knee amputation of R leg, upper/lower extremity pulses 2+, no lower extremity edema present. Healing ulceration of sole of L second toe  Filed Weights   07/21/21 1115 07/23/21 0500  Weight: 131.5 kg 135.5 kg     Intake/Output Summary (Last 24 hours) at 07/23/2021 0729 Last data filed at 07/22/2021 0900 Gross per 24 hour  Intake --  Output 625 ml  Net -625 ml   Net IO Since Admission:  2,330.68 mL [07/23/21 0729]  Pertinent Labs:    Latest Ref Rng & Units 07/22/2021    2:10 AM 07/21/2021    8:40 AM 07/21/2021    5:44 AM  CBC  WBC 4.0 - 10.5 K/uL 11.9   15.7   Hemoglobin 13.0 - 17.0 g/dL 07/23/2021  94.8  54.6   Hematocrit 39.0 - 52.0 % 39.0  45.0  44.2   Platelets 150 - 400 K/uL 193   291        Latest Ref Rng & Units 07/23/2021    2:20 AM 07/22/2021    4:08 PM 07/22/2021    2:10 AM  CMP  Glucose 70 - 99 mg/dL 09/22/2021  82  350   BUN 6 - 20 mg/dL 18  15  19    Creatinine 0.61 - 1.24 mg/dL 093   8.18   Sodium 135 - 145 mmol/L 136  138  137   Potassium 3.5 - 5.1 mmol/L 3.4  3.6  3.9   Chloride 98 - 111 mmol/L 100  105  100   CO2 22 - 32 mmol/L 24  25  23    Calcium 8.9 - 10.3 mg/dL 8.6  8.7  8.8   Total Protein 6.5 - 8.1 g/dL   6.4   Total Bilirubin 0.3 - 1.2 mg/dL   1.3   Alkaline Phos 38 - 126 U/L  85   AST 15 - 41 U/L   14   ALT 0 - 44 U/L   14    Last PM and this AM BG: 149, 174, 139  CT 6/30: IMPRESSION: Cholelithiasis.   Tiny umbilical hernia containing fat.   Minimal sclerosis of T11 vertebral body with minimal anterior height loss unchanged since 12/06/2019, uncertain etiology and significance; if the patient has any older prior outside imaging which can demonstrate long-term stability, these would be of benefit.   No acute intra-abdominal or intrapelvic abnormalities. EKG: Sinus tachycardia without prolonged QTc  Assessment/Plan:   Principal Problem:   DKA (diabetic ketoacidosis) (HCC) Active Problem: T2DM Active Problems: AKI Anxiety Depression PTSD   Patient Summary: Mark [name] is a 61 y.o. with a pertinent PMH of R above knee amputation, HTN, HLD, T2DM, and multiple psychiatric diagnoses, who presented with nausea, vomiting, and abdominal pain and admitted for DKA.      #DKA with history of T2DM #GI foodborne illness with nausea/vomiting (resolving) #dehydration (resolving) Presented with DKA and transitioned to subQ. A1c 10.0  compared with 9.9 9 months ago. Recent glucose readings range from 139-174. Patient had continued nausea and vomiting yesterday AM and zofran seemed to help. He mentioned he was able to eat dinner last night and breakfast this morning.  Description of undigested food in vomit prompting suspicion for possibly gastroparesis in the setting of undertreated T2DM. Abdominal tenderness in LUQ/epigastrium might reflect possible gastritis in setting of foodborne illness, distention, PUD, or posisbly pancreatitis as patient describes this is similar to his previous episode. Low suspicion for cholecystitis or gallstone etiology. Plan: -Semglee 30u qAM/30u QHS -novolog 10 units TID with meals in addition to novolog 0-20 units SSI and 0-5 units SSI QHS as patient is eating again. Possibly increase if subsequent BG are elevated after meals. -CTM glucose -continue LR 149mL/hr -continue zofran 4mg  Q6 PRN for n/v -Tylenol 650mg  q6 PRN for pain, consider different pain management strategy if significant pain continues. -consider reglan for gastric motility after gastric emptying study, EKG to recheck QTc -lipase to rule out pancreatitis -CMP tomorrow to check LFTs   #non-oliguric AKI (resolved) -continue to monitor Cr while giving fluids   #Anxiety #Depression #PTSD Continue prazosin 2mg  daily, trazodone 50 mg QHS   #constipation Patient reports 2 bowel movement on miralax with improving tenderness Plan: -continue bowel regimen  Diet: Carb-Modified IVF: LR,100cc/hr VTE: SCDs Code: Full PT/OT recs: Pending, none. TOC recs:  Family Update:   Dispo: d/c pending resolution of abdominal pain, diabetes  , MD PGY-1 Internal Medicine Resident Please contact the on call pager after 5 pm and on weekends at [phone].

## 2021-07-24 ENCOUNTER — Ambulatory Visit (HOSPITAL_COMMUNITY): Payer: Medicare HMO | Admitting: Licensed Clinical Social Worker

## 2021-07-24 DIAGNOSIS — F4310 Post-traumatic stress disorder, unspecified: Secondary | ICD-10-CM

## 2021-07-24 DIAGNOSIS — F418 Other specified anxiety disorders: Secondary | ICD-10-CM

## 2021-07-24 DIAGNOSIS — K859 Acute pancreatitis without necrosis or infection, unspecified: Secondary | ICD-10-CM

## 2021-07-24 DIAGNOSIS — G4733 Obstructive sleep apnea (adult) (pediatric): Secondary | ICD-10-CM

## 2021-07-24 DIAGNOSIS — Z9989 Dependence on other enabling machines and devices: Secondary | ICD-10-CM

## 2021-07-24 LAB — GLUCOSE, CAPILLARY
Glucose-Capillary: 178 mg/dL — ABNORMAL HIGH (ref 70–99)
Glucose-Capillary: 122 mg/dL — ABNORMAL HIGH (ref 70–99)
Glucose-Capillary: 121 mg/dL — ABNORMAL HIGH (ref 70–99)
Glucose-Capillary: 190 mg/dL — ABNORMAL HIGH (ref 70–99)

## 2021-07-24 LAB — COMPREHENSIVE METABOLIC PANEL
ALT: 11 U/L (ref 0–44)
AST: 12 U/L — ABNORMAL LOW (ref 15–41)
Albumin: 2.7 g/dL — ABNORMAL LOW (ref 3.5–5.0)
Alkaline Phosphatase: 76 U/L (ref 38–126)
Anion gap: 8 (ref 5–15)
BUN: 17 mg/dL (ref 6–20)
CO2: 25 mmol/L (ref 22–32)
Calcium: 8.2 mg/dL — ABNORMAL LOW (ref 8.9–10.3)
Chloride: 102 mmol/L (ref 98–111)
Creatinine, Ser: 0.99 mg/dL (ref 0.61–1.24)
GFR, Estimated: >60 mL/min (ref >60)
Glucose, Bld: 109 mg/dL — ABNORMAL HIGH (ref 70–99)
Potassium: 3.7 mmol/L (ref 3.5–5.1)
Sodium: 135 mmol/L (ref 135–145)
Total Bilirubin: 0.4 mg/dL (ref 0.3–1.2)
Total Protein: 5.6 g/dL — ABNORMAL LOW (ref 6.5–8.1)

## 2021-07-24 LAB — MRSA NEXT GEN BY PCR, NASAL: MRSA by PCR Next Gen: DETECTED — AB

## 2021-07-24 LAB — LIPASE, BLOOD: Lipase: 208 U/L — ABNORMAL HIGH (ref 11–51)

## 2021-07-24 MED ORDER — ARIPIPRAZOLE 10 MG PO TABS
10.0000 mg | ORAL_TABLET | Freq: Every day | ORAL | Status: DC
  Administered 2021-07-24 – 2021-07-25 (×2): 10 mg via ORAL
  Filled 2021-07-24 (×2): qty 1

## 2021-07-24 MED ORDER — HYDROXYZINE HCL 10 MG PO TABS: 25.0000 mg | ORAL_TABLET | Freq: Two times a day (BID) | ORAL | Status: DC | PRN

## 2021-07-24 MED ORDER — INSULIN ASPART 100 UNIT/ML IJ SOLN
15.0000 [IU] | Freq: Three times a day (TID) | INTRAMUSCULAR | Status: DC
Start: 2021-07-24 — End: 2021-07-25
  Administered 2021-07-24 – 2021-07-25 (×3): 15 [IU] via SUBCUTANEOUS

## 2021-07-24 MED ORDER — BUSPIRONE HCL 5 MG PO TABS
15.0000 mg | ORAL_TABLET | Freq: Three times a day (TID) | ORAL | Status: DC
  Administered 2021-07-24 – 2021-07-25 (×4): 15 mg via ORAL
  Filled 2021-07-24 (×4): qty 3

## 2021-07-24 MED ORDER — ESCITALOPRAM OXALATE 20 MG PO TABS
20.0000 mg | ORAL_TABLET | Freq: Every day | ORAL | Status: DC
  Administered 2021-07-24 – 2021-07-25 (×2): 20 mg via ORAL
  Filled 2021-07-24 (×2): qty 1

## 2021-07-24 MED ORDER — BUPROPION HCL ER (XL) 150 MG PO TB24
150.0000 mg | ORAL_TABLET | Freq: Every day | ORAL | Status: DC
  Administered 2021-07-24 – 2021-07-25 (×2): 150 mg via ORAL
  Filled 2021-07-24 (×2): qty 1

## 2021-07-24 NOTE — Progress Notes (Signed)
  Transition of Care [facility]) Screening Note   Patient Details  Name: Cortavius Montesinos Date of Birth: 02-21-1960   Transition of Care [facility]) CM/SW Contact:    Harriet Masson, RN Phone Number: 07/24/2021, 8:48 AM    Transition of Care Department [facility]) has reviewed patient and no TOC needs have been identified at this time. We will continue to monitor patient advancement through interdisciplinary progression rounds. If new patient transition needs arise, please place a TOC consult.

## 2021-07-24 NOTE — Progress Notes (Signed)
[facility] Hour [facility] notification completed online: Ref# [id number]  Joaquin Courts, MSW, [facility]

## 2021-07-24 NOTE — Progress Notes (Signed)
Subjective:   Summary: Mark [name] is a 61 y.o. year old male currently admitted on the IMTS HD#3 for DKA.  Overnight Events:  -Percocet taken 2x 1300 and 2200, mentions his pain was much easier to manage  -last night semglee not taken, BG down to 71 2116, 109 0000. He was symptomatic with lightheadness, weakness. -eating well yesterday, completing all meals -has been having dark brown/black cakey diarrhea -Asked about whether he could be restarted on his home psych regimen -Also mentioned he uses his CPAP at home, but has been sleeping alright -informed us of positive prior MRSA screen a couple years back   Objective:  Vital signs in last 24 hours: Vitals:   07/23/21 1535 07/23/21 2016 07/24/21 0332 07/24/21 0428  BP: 140/73 132/62 (!) 154/54   Pulse:  96 86   Resp:  18 18   Temp: 98 F (36.7 C) 98 F (36.7 C) 97.8 F (36.6 C)   TempSrc: Oral Oral Oral   SpO2:  94%    Weight:    (!) 138.2 kg  Height:       Supplemental O2: Room Air SpO2: 94 % O2 Flow Rate (L/min): 4 L/min   Physical Exam:  Constitutional: appears more comfortable than yesterday, sitting in bed, in no acute distress. Interactive and answers questions Cardiovascular: RRR, no murmurs, rubs or gallops Pulmonary/Chest: normal work of breathing on room air, lungs clear to auscultation bilaterally Abdominal: soft, minimal tenderness to palpation in LUQ/epigastric area with improvement from yesterday, mildly distended Skin: warm and dry Extremities: Above knee amputation of R leg, upper/lower extremity pulses 2+, no lower extremity edema present. Healing wound of sole of L second toe  Filed Weights   07/21/21 1115 07/23/21 0500 07/24/21 0428  Weight: 131.5 kg 135.5 kg (!) 138.2 kg     Intake/Output Summary (Last 24 hours) at 07/24/2021 0643 Last data filed at 07/23/2021 1500 Gross per 24 hour  Intake 568.25 ml  Output --  Net 568.25 ml   Net IO Since Admission: 2,898.93 mL [07/24/21  0643]  Pertinent Labs:     Latest Ref Rng & Units 07/24/2021   12:34 AM 07/23/2021    2:20 AM 07/22/2021    4:08 PM  CMP  Glucose 70 - 99 mg/dL 161  096  82   BUN 6 - 20 mg/dL 17  18  15    Creatinine 0.61 - 1.24 mg/dL  0.45  4.09   Sodium 135 - 145 mmol/L 135  136  138   Potassium 3.5 - 5.1 mmol/L 3.7  3.4  3.6   Chloride 98 - 111 mmol/L 102  100  105   CO2 22 - 32 mmol/L 25  24  25    Calcium 8.9 - 10.3 mg/dL 8.2  8.6  8.7   Total Protein 6.5 - 8.1 g/dL 5.6     Total Bilirubin 0.3 - 1.2 mg/dL 0.4     Alkaline Phos 38 - 126 U/L 76     AST 15 - 41 U/L 12     ALT 0 - 44 U/L 11     Lipase today 208, lipase yesterday 92  Last 2 BG 71, 109  Imaging: CT 6/30: IMPRESSION: Cholelithiasis.   Tiny umbilical hernia containing fat.   Minimal sclerosis of T11 vertebral body with minimal anterior height loss unchanged since 12/06/2019, uncertain etiology and significance; if the patient has any older  prior outside imaging which can demonstrate long-term stability, these would be of benefit.   No acute intra-abdominal or intrapelvic abnormalities.  EKG: Sinus tachycardia without prolonged QTc  Assessment/Plan:   Principal Problem:   DKA (diabetic ketoacidosis) (HCC) Active Problems:   Nausea and vomiting   Left upper quadrant abdominal pain Pancreatis Possible Gastroparesis AKI Anxiety Depression PTSD MRSA colonization  Patient Summary: Mark [name] is a 61 y.o. with a pertinent PMH of R above knee amputation, HTN, HLD, T2DM, and multiple psychiatric diagnoses, who presented with nausea, vomiting, and abdominal pain and admitted for DKA with new diagnosis of pancreatitis     #DKA with history of T2DM #GI foodborne illness with nausea/vomiting (resolving) #dehydration (resolving) Presented with DKA and transitioned to subQ. A1c 10.0 compared with 9.9 9 months ago. Recent glucose readings lower at 71 and 109, semglee last night refused and patient describes symptomatic  hypoglycemia. Abdominal tenderness in LUQ/epigastrium likely reflects pancreatitis.  Plan: -Semglee 30u qAM/30u QHS -novolog 15 units TID with meals in addition to novolog 0-20 units SSI and 0-5 units SSI QHS  -CTM glucose, BMP -LR 121mL/hr -continue zofran 4mg  Q6 PRN for n/v -Tylenol 650mg  q6 PRN for pain, percocet 5-325 mg q8 PRN for moderate-severe pain -consider reglan for gastric motility after gastric emptying study, EKG to recheck QTc  #Acute Pancreatitis Lipase this AM 208 with uptrend from admission, LUQ/epigastric pain similar to prior episodes of pancreatitis. Will continue to aggressively treat with fluids, pain management, encouraging PO intake. Low suspicion for gallstone pancreatitis given LFTs, AlkPhos wnl. Plan: -LR increased to 125 mL/hr from 100 mL/hr -aggressive pain management with percocet 5-325 mg Q8 PRN for moderate-severe pain, will increase regimen if appropriate  -continue PO intake  #Possible diabetic gastroparesis Description of undigested food in vomit prompting suspicion for possibly gastroparesis in the setting of undertreated T2DM. Workup deferred temporarily in context of pancreatitis treatment. Recent EKG with normal Qtc if planning to start reglan down the road. Plan: Consider gastric emptying study in the future with reglan as treatment to improve motility.   #non-oliguric AKI (resolved) -continue to monitor Cr while giving fluids   #Anxiety #Depression #PTSD -Continue prazosin 2mg  daily, trazodone 50 mg QHS -Added home regimen of hydroxyzine 25-50mg  BID PRN, lexapro 20mg  daily, buspar 15mg  TID, Wellbutrin 24 hr 150mg  daily, abilify 10mg  daily   #constipation (resolved) Bowel regimen stopped in setting of diarrhea, patient reporting symptomatic improvement  #MRSA Screen Prior positive MRSA screen in 2021, will recheck   Diet: Carb-Modified IVF: LR,125cc/hr VTE: SCDs Code: Full PT/OT recs: Pending, none. TOC recs:  Family  Update:   Dispo: Anticipated discharge to Home  pending symptomatic improvement for pancreatitis, toleration of diet advancement, BG stability  , MD PGY-1 Internal Medicine Resident Please contact the on call pager after 5 pm and on weekends at [phone].

## 2021-07-25 ENCOUNTER — Encounter (HOSPITAL_COMMUNITY): Payer: Self-pay | Admitting: Internal Medicine

## 2021-07-25 LAB — BASIC METABOLIC PANEL
Anion gap: 10 (ref 5–15)
BUN: 10 mg/dL (ref 6–20)
CO2: 26 mmol/L (ref 22–32)
Calcium: 9 mg/dL (ref 8.9–10.3)
Chloride: 105 mmol/L (ref 98–111)
Creatinine, Ser: 0.9 mg/dL (ref 0.61–1.24)
GFR, Estimated: >60 mL/min (ref >60)
Glucose, Bld: 89 mg/dL (ref 70–99)
Potassium: 3.8 mmol/L (ref 3.5–5.1)
Sodium: 141 mmol/L (ref 135–145)

## 2021-07-25 LAB — GLUCOSE, CAPILLARY
Glucose-Capillary: 85 mg/dL (ref 70–99)
Glucose-Capillary: 170 mg/dL — ABNORMAL HIGH (ref 70–99)

## 2021-07-25 MED ORDER — INSULIN ASPART 100 UNIT/ML ~~LOC~~ SOLN: 15.0000 [IU] | Freq: Three times a day (TID) | SUBCUTANEOUS | 11 refills | Status: AC

## 2021-07-25 MED ORDER — ONDANSETRON HCL 4 MG PO TABS: 4.0000 mg | ORAL_TABLET | Freq: Four times a day (QID) | ORAL | 0 refills | Status: AC | PRN

## 2021-07-25 MED ORDER — INSULIN GLARGINE 100 UNIT/ML ~~LOC~~ SOLN: 30.0000 [IU] | Freq: Two times a day (BID) | SUBCUTANEOUS | 11 refills | Status: AC

## 2021-07-25 NOTE — Evaluation (Signed)
Physical Therapy Evaluation Patient Details Name: Mark [name] MRN: [id number] DOB: [birth year] Today's Date: 07/25/2021  History of Present Illness  61 y.o. year old male presented to ED 6/30 with nausea, vomiting, and abdominal pain and admitted for DKA with new diagnosis of pancreatitis. PMH of R above knee amputation, HTN, HLD, T2DM, and multiple psychiatric diagnoses,.   Clinical Impression  Patient evaluated by Physical Therapy with no further acute PT needs identified. All education has been completed and the patient has no further questions. Pt is at a supervision level for longer distance hopping with RW. Pt is mod I with transfers which is his normal for transfer to his powerchair. Pt has no follow-up Physical Therapy or equipment needs. PT is signing off. Thank you for this referral.        Recommendations for follow up therapy are one component of a multi-disciplinary discharge planning process, led by the attending physician.  Recommendations may be updated based on patient status, additional functional criteria and insurance authorization.  Follow Up Recommendations No PT follow up      Assistance Recommended at Discharge Intermittent Supervision/Assistance  Patient can return home with the following  Assistance with cooking/housework    Equipment Recommendations None recommended by PT     Functional Status Assessment Patient has not had a recent decline in their functional status     Precautions / Restrictions Precautions Precautions: Fall Restrictions Weight Bearing Restrictions: No      Mobility  Bed Mobility               General bed mobility comments: sitting EoB on entry    Transfers Overall transfer level: Modified independent Equipment used: Rolling walker (2 wheels)               General transfer comment: good power up and self steady in RW    Ambulation/Gait Ambulation/Gait assistance: Supervision Gait Distance (Feet): 18  Feet Assistive device: Rolling walker (2 wheels) Gait Pattern/deviations: Step-to pattern Gait velocity: appropriate Gait velocity interpretation: 1.31 - 2.62 ft/sec, indicative of limited community ambulator   General Gait Details: strong, steady hop to pattern        Balance Overall balance assessment: Needs assistance   Sitting balance-Leahy Scale: Normal     Standing balance support: Bilateral upper extremity supported, Single extremity supported, Reliant on assistive device for balance Standing balance-Leahy Scale: Poor                               Pertinent Vitals/Pain Pain Assessment Pain Assessment: 0-10 Pain Score: 4  Pain Location: stomach and residual limb (phantom pains) Pain Descriptors / Indicators: Aching, Cramping Pain Intervention(s): Limited activity within patient's tolerance, Monitored during session, Repositioned    Home Living Family/patient expects to be discharged to:: Private residence Living Arrangements: Alone Available Help at Discharge: Family Type of Home: House Home Access: Ramped entrance       Home Layout: One level Home Equipment: Tub bench;Grab bars - toilet;Grab bars - tub/shower;Wheelchair - power;Hand held Air traffic controller (2 wheels)      Prior Function               Mobility Comments: lateral scoot transfer to power chair, drives L foot accelerator ADLs Comments: Aide 5x/wk, cleaning and cooking 2-2.5 hrs, independent with bathing and dressing     Hand Dominance   Dominant Hand: Right    Extremity/Trunk Assessment   Upper Extremity Assessment Upper  Extremity Assessment: Overall WFL for tasks assessed    Lower Extremity Assessment Lower Extremity Assessment: RLE deficits/detail;LLE deficits/detail RLE Deficits / Details: R AKA LLE Deficits / Details: ROM WFL, strength 4/5    Cervical / Trunk Assessment Cervical / Trunk Assessment: Normal  Communication   Communication: No  difficulties  Cognition Arousal/Alertness: Awake/alert Behavior During Therapy: WFL for tasks assessed/performed Overall Cognitive Status: Within Functional Limits for tasks assessed                                          General Comments General comments (skin integrity, edema, etc.): VSS on RA        Assessment/Plan    PT Assessment Patient does not need any further PT services         PT Goals (Current goals can be found in the Care Plan section)  Acute Rehab PT Goals Patient Stated Goal: get home to his dog PT Goal Formulation: With patient Time For Goal Achievement: 08/08/21     AM-PAC PT "6 Clicks" Mobility  Outcome Measure Help needed turning from your back to your side while in a flat bed without using bedrails?: None Help needed moving from lying on your back to sitting on the side of a flat bed without using bedrails?: None Help needed moving to and from a bed to a chair (including a wheelchair)?: None Help needed standing up from a chair using your arms (e.g., wheelchair or bedside chair)?: None Help needed to walk in hospital room?: None Help needed climbing 3-5 steps with a railing? : A Lot 6 Click Score: 22    End of Session Equipment Utilized During Treatment: Gait belt Activity Tolerance: Patient tolerated treatment well Patient left: in bed;with call bell/phone within reach Nurse Communication: Mobility status PT Visit Diagnosis: Muscle weakness (generalized) (M62.81)    Time: 9833-8250 PT Time Calculation (min) (ACUTE ONLY): 21 min   Charges:   PT Evaluation $PT Eval Moderate Complexity: 1 Mod          Younique Casad B. Beverely Risen PT, DPT Acute Rehabilitation Services Please use secure chat or  Call Office [phone]   Elon Alas [facility] 07/25/2021, 9:27 AM

## 2021-07-25 NOTE — Discharge Instructions (Addendum)
Discharge diabetes regimen reflects the amount of insulin you were taking in the hospital: 30 units of long-acting insulin in the morning and at night, and 15 units with meals. You were previously on 50 units in the morning and 25 units at night with 35 units after meals. Feel free to change your insulin regimen as appropriate according to your home blood glucose readings. Follow up with your endocrinologist to go over diabetes regimen and medication rearrangement.  You were diagnosed with pancreatitis in the hospital. In case you have worsening abdominal pain, severe nausea and vomiting, or fever, please come back to the emergency department for evaluation.

## 2021-07-25 NOTE — Discharge Summary (Signed)
Name: Mark [name] MRN: [id number] DOB: 1960-03-30 61 y.o. PCP: [name]  Date of Admission: 07/21/2021  5:12 AM Date of Discharge: 07/25/2021 Attending Physician: Inez Catalina, MD  Discharge Diagnosis: Principal Problems: 1. DKA with T2DM 2. Pancreatitis Active Problems: 1.Possible Gastroparesis 2.AKI  Discharge Medications: Allergies as of 07/25/2021       Reactions   Ozempic (0.25 Or 0.5 Mg-dose) [semaglutide(0.25 Or 0.5mg -dos)] Other (See Comments)   Caused Pancreatitis   Semaglutide Other (See Comments)   Caused pancreatitis   Lyrica [pregabalin] Rash        Medication List     STOP taking these medications    doxycycline 100 MG capsule Commonly known as: MONODOX       TAKE these medications    ARIPiprazole 10 MG tablet Commonly known as: ABILIFY Take 10 mg by mouth daily.   buPROPion 150 MG 24 hr tablet Commonly known as: WELLBUTRIN XL Take 1 tablet (150 mg total) by mouth daily.   busPIRone 15 MG tablet Commonly known as: BUSPAR Take 1 tablet (15 mg total) by mouth 3 (three) times daily.   carvedilol 6.25 MG tablet Commonly known as: COREG Take 3.125 mg by mouth 2 (two) times daily with a meal.   clindamycin 1 % external solution Commonly known as: CLEOCIN T Apply 1 Application topically 2 (two) times daily.   cyclobenzaprine 10 MG tablet Commonly known as: FLEXERIL Take 10 mg by mouth 2 (two) times daily as needed for muscle spasms.   diclofenac Sodium 1 % Gel Commonly known as: VOLTAREN Apply 2 g topically daily as needed (pain).   escitalopram 20 MG tablet Commonly known as: LEXAPRO Take 20 mg by mouth daily.   hydrOXYzine 25 MG capsule Commonly known as: VISTARIL Take 25-50 mg by mouth 2 (two) times daily as needed for anxiety.   insulin aspart 100 UNIT/ML injection Commonly known as: novoLOG Inject 15 Units into the skin 3 (three) times daily before meals. What changed: how much to take   insulin glargine  100 UNIT/ML injection Commonly known as: LANTUS Inject 0.3 mLs (30 Units total) into the skin 2 (two) times daily. What changed:  how much to take when to take this   modafinil 100 MG tablet Commonly known as: PROVIGIL Take 100 mg by mouth daily.   ondansetron 4 MG tablet Commonly known as: ZOFRAN Take 1 tablet (4 mg total) by mouth every 6 (six) hours as needed for nausea.   prazosin 2 MG capsule Commonly known as: MINIPRESS Take 1 capsule (2 mg total) by mouth at bedtime.   sildenafil 100 MG tablet Commonly known as: VIAGRA Take 100 mg by mouth daily as needed for erectile dysfunction.   silver sulfADIAZINE 1 % cream Commonly known as: SILVADENE Apply 1 Application topically daily as needed (affected area).   Thera-Gesic 1-15 % Crea Apply 1 Application topically 2 (two) times daily as needed for pain.   traZODone 50 MG tablet Commonly known as: DESYREL Take 1 tablet (50 mg total) by mouth at bedtime as needed. What changed: reasons to take this        Disposition and follow-up:   Mr.Ashur Kirchhoff was discharged from [facility] in Good condition.  At the hospital follow up visit please address:  1.  Reassess home diabetes regimen and interval blood glucose values.  Patient discharged on 30 units twice daily of long-acting insulin as well as 15 units short acting as meal coverage with minimal additions through  sliding scale. 2.  Considered gastric emptying study as evaluation of diabetic gastroparesis 3.  Labs / imaging needed at time of follow-up: Interval Home blood sugar readings   Follow-up Appointments: Informed patient to call PCP  Hospital Course by problem list: [name] is a 61 y.o. with a pertinent PMH of R above knee amputation, HTN, HLD, T2DM, and multiple psychiatric diagnoses, who presented with nausea, vomiting, and abdominal pain and admitted for DKA with additional diagnosis of pancreatitis.  1. DKA with history of  T2DM Patient presented to the ED with chief complaints of nausea, vomiting, abdominal pain following likely GI foodborne illness.  He additionally reported days of not taking his diabetes regimen.  Initial evaluation was positive for significant anion gap metabolic acidosis, elevated beta hydroxybutyrate, UA showing urine ketones, mild lactic acidosis.  Initial CT abdominal imaging was negative.  Patient started on DKA protocol with insulin drip, IV fluids and quickly transition to subcu insulin the night of presentation with 2 consecutive BMPs showing closed anion gap and resolution of acidosis.  Insulin regimen managed in the hospital to control blood sugars within acceptable range.  2.  Acute pancreatitis, left upper quadrant pain, nausea and vomiting On hospital day 1 patient complained of severe left upper quadrant abdominal pain without significant resolution with conservative pain management.  Lipase on admission was 40 with subsequent values of 92 and 208 leading to diagnosis of pancreatitis.  Managed with supportive measures including aggressive fluids pain management and increased p.o. intake during course of hospitalization.  Pain resolved without Percocet, fluids withdrawn, and patient tolerating p.o. intake on day of discharge.  3.  Possible diabetic gastroparesis Patient complaining of barely digested food and vomit in the setting of uncontrolled diabetes (A1c 10) prompting suspicion for diabetic gastroparesis in the context of abdominal pain.  Work-up deferred during inpatient hospitalization however might be appropriate upon follow-up, according to discretion of patient and provider.  4.  Acute kidney injury Creatinine upon admission 1.48 compared to baseline of approximately 1.  Assessed to be prerenal AKI in the context of severe dehydration with DKA.  Creatinine quickly normalized to baseline with aggressive fluid management and resolution of DKA and remained at baseline throughout the  remaining hospital stay.  5.  Anxiety, depression, PTSD Continued home medication regimen without any acute issues.  6.  Obstructive sleep apnea Continued CPAP  Day of discharge subjective: Patient reported minimal abdominal pain, had not taken Percocet for approximately 12 hours.  Vitals were stable patient remained nontachycardic over the past 12 hours.  He reported significantly improved p.o. intake with frequent bowel movements and improved appetite.  Minimal nausea with no vomiting which was easily managed with as needed Zofran.  No interval episodes of symptomatic hypoglycemia.  He states his symptoms have been resolved and he feels similar to how he felt prior to the hospitalization and expressed interest in returning home.  Discharge Exam:   BP (!) 178/84 (BP Location: Left Arm)   Pulse 92   Temp 98.2 F (36.8 C) (Axillary)   Resp 16   Ht 6\' 2"  (1.88 m)   Wt (!) 138.2 kg   SpO2 100%   BMI 39.12 kg/m   Constitutional: appears comfortable, sitting in bed, in no acute distress. Interactive and answers questions Cardiovascular: RRR, no murmurs, rubs or gallops Pulmonary/Chest: normal work of breathing on room air, lungs clear to auscultation bilaterally Abdominal: soft, nondistended, no tenderness to palpation in LUQ/epigastric area Skin: warm and dry Extremities: Above knee  amputation of R leg, upper/lower extremity pulses 2+, no lower extremity edema present. Healing wound on plantar surface of L second toe  Pertinent Labs, Studies, and Procedures: CT ABDOMEN PELVIS W CONTRAST  Result Date: 07/21/2021 CLINICAL DATA:  Acute abdominal pain, nausea, and vomiting for 2 days after eating spicy food, LEFT lower quadrant pain, blood in emesis EXAM: CT ABDOMEN AND PELVIS WITH CONTRAST TECHNIQUE: Multidetector CT imaging of the abdomen and pelvis was performed using the standard protocol following bolus administration of intravenous contrast. RADIATION DOSE REDUCTION: This exam was  performed according to the departmental dose-optimization program which includes automated exposure control, adjustment of the mA and/or kV according to patient size and/or use of iterative reconstruction technique. CONTRAST:  22mL OMNIPAQUE IOHEXOL 300 MG/ML  SOLN COMPARISON:  None Available. FINDINGS: Lower chest: 2 mm RIGHT lower lobe nodule image 1, incompletely visualized, present on previous exam as well. Remaining lung bases clear. Hepatobiliary: Dependent calculi within gallbladder. Liver unremarkable. Pancreas: Normal appearance Spleen: Normal appearance Adrenals/Urinary Tract: Adrenal glands normal appearance. Tiny BILATERAL renal cysts. No additional renal mass, hydronephrosis, or hydroureter. No urinary tract calcification. Bladder unremarkable. Stomach/Bowel: Normal appendix. Stomach and bowel loops normal appearance. Vascular/Lymphatic: Atherosclerotic calcifications aorta and iliac arteries without aneurysm. No adenopathy. Reproductive: Unremarkable prostate gland and seminal vesicles Other: No free air or free fluid. Tiny umbilical hernia containing fat. Musculoskeletal: Degenerative changes RIGHT hip joint. Minimal sclerosis of T11 vertebral body with minimal anterior height loss unchanged since 12/06/2019, uncertain etiology and significance. IMPRESSION: Cholelithiasis. Tiny umbilical hernia containing fat. Minimal sclerosis of T11 vertebral body with minimal anterior height loss unchanged since 12/06/2019, uncertain etiology and significance; if the patient has any older prior outside imaging which can demonstrate long-term stability, these would be of benefit. No acute intra-abdominal or intrapelvic abnormalities. Aortic Atherosclerosis (ICD10-I70.0). Electronically Signed   By: Ulyses Southward M.D.   On: 07/21/2021 10:02   DG Chest Portable 1 View  Result Date: 07/21/2021 CLINICAL DATA:  Nausea and vomiting EXAM: PORTABLE CHEST 1 VIEW COMPARISON:  Prior chest x-ray 12/04/2019 FINDINGS: The heart  size and mediastinal contours are within normal limits. Both lungs are clear. The visualized skeletal structures are unremarkable. IMPRESSION: No active disease. Electronically Signed   By: Malachy Moan M.D.   On: 07/21/2021 08:43       Latest Ref Rng & Units 07/22/2021    2:10 AM 07/21/2021    8:40 AM 07/21/2021    5:44 AM  CBC  WBC 4.0 - 10.5 K/uL 11.9   15.7   Hemoglobin 13.0 - 17.0 g/dL 20.9  47.0  96.2   Hematocrit 39.0 - 52.0 % 39.0  45.0  44.2   Platelets 150 - 400 K/uL 193   291       Latest Ref Rng & Units 07/25/2021    8:16 AM 07/24/2021   12:34 AM 07/23/2021    2:20 AM 07/22/2021    4:08 PM 07/22/2021    2:10 AM 07/21/2021    5:40 PM 07/21/2021    1:34 PM  CMP  Glucose 70 - 99 mg/dL 89  836  629  82  476  194  188   BUN 6 - 20 mg/dL 10  17  18  15  19  22  20    Creatinine 0.61 - 1.24 mg/dL  5.46  5.03  5.46  1.12  1.10  1.16   Sodium 135 - 145 mmol/L 141  135  136  138  137  139  138   Potassium 3.5 - 5.1 mmol/L 3.8  3.7  3.4  3.6  3.9  4.6  3.9   Chloride 98 - 111 mmol/L 105  102  100  105  100  102  100   CO2 22 - 32 mmol/L 26  25  24  25  23  28  26    Calcium 8.9 - 10.3 mg/dL 9.0  8.2  8.6  8.7  8.8  9.0  9.3   Total Protein 6.5 - 8.1 g/dL  5.6    6.4     Total Bilirubin 0.3 - 1.2 mg/dL  0.4    1.3     Alkaline Phos 38 - 126 U/L  76    85     AST 15 - 41 U/L  12    14     ALT 0 - 44 U/L  11    14       Latest Reference Range & Units 07/21/21 08:40  Sample type  VENOUS  pH, Ven 7.25 - 7.43  7.472 (H)  pCO2, Ven 44 - 60 mmHg 28.2 (L)  pO2, Ven 32 - 45 mmHg 184 (H)  TCO2 22 - 32 mmol/L 21 (L)  Acid-base deficit 0.0 - 2.0 mmol/L 2.0  Bicarbonate 20.0 - 28.0 mmol/L 20.6  O2 Saturation % 100  (H): Data is abnormally high (L): Data is abnormally low  Lipase: 40 (07/21/2021), 92 (07/23/2021), 208 (07/24/2021)  Beta hydroxybutyric acid 5.77 (07/21/2021)  Hemoglobin A1c 10.0 (07/21/2021)  Discharge Instructions: Discharge diabetes regimen reflects the amount of insulin  you were taking in the hospital: 30 units of long-acting insulin in the morning and at night, and 15 units with meals. You were previously on 50 units in the morning and 25 units at night with 35 units after meals. Feel free to change your insulin regimen as appropriate according to your home blood glucose readings. Follow up with your endocrinologist to go over diabetes regimen and medication rearrangement.  You were diagnosed with pancreatitis in the hospital. In case you have worsening abdominal pain, severe nausea and vomiting, or fever, please come back to the emergency department for evaluation.  Signed: 07/23/2021, MD 07/25/2021, 10:45 AM   Pager: [phone]

## 2021-07-25 NOTE — Progress Notes (Signed)
OT Cancellation Note  Patient Details Name: Marvin Grabill MRN: [id number] DOB: 1960-12-19   Cancelled Treatment:    Reason Eval/Treat Not Completed: OT screened, no needs identified, will sign off (Discussed with PT. Pt at his baseline and has assistance at home)  [facility] 07/25/2021, 9:55 AM Luisa Dago, OT/L   Acute OT Clinical Specialist Acute Rehabilitation Services Pager [phone] Office [phone]

## 2021-07-26 ENCOUNTER — Other Ambulatory Visit: Payer: Self-pay

## 2021-07-27 ENCOUNTER — Ambulatory Visit (HOSPITAL_COMMUNITY): Payer: Medicare HMO | Admitting: Licensed Clinical Social Worker

## 2021-07-31 ENCOUNTER — Ambulatory Visit (INDEPENDENT_AMBULATORY_CARE_PROVIDER_SITE_OTHER): Payer: Medicare HMO | Admitting: Licensed Clinical Social Worker

## 2021-07-31 ENCOUNTER — Encounter (HOSPITAL_COMMUNITY): Payer: Self-pay | Admitting: Licensed Clinical Social Worker

## 2021-07-31 DIAGNOSIS — F331 Major depressive disorder, recurrent, moderate: Secondary | ICD-10-CM | POA: Diagnosis not present

## 2021-07-31 DIAGNOSIS — F419 Anxiety disorder, unspecified: Secondary | ICD-10-CM | POA: Diagnosis not present

## 2021-07-31 DIAGNOSIS — F4310 Post-traumatic stress disorder, unspecified: Secondary | ICD-10-CM

## 2021-07-31 NOTE — Progress Notes (Signed)
Virtual Visit via Video Note  I connected with Mark [name] on 07/31/2021 at 5:00 pm EST by a video enabled telemedicine application and verified that I am speaking with the correct person using two identifiers.   I discussed the limitations of evaluation and management by telemedicine and the availability of in person appointments. The patient expressed understanding and agreed to proceed.  LOCATION: Patient: Home Provider: home Office  History of Present Illness: [facility], depression, [facility].  Treatment Goal Addressed:  Pt will develop the ability to recognize, accept, and cope with feelings of depression AEB self report.  Progress towards Goal:  Progressing    Observation/Objective:   Patient participated in a discussion about the fireworks at 4th of July celebrations and how it affected the effects of PTSD. Patient identified her effects and coping skills during the celebration, which including heightened reactions and memories of the trauma. Patient was encouraged to use coping skills when there will be fireworks.     Assessment and Plan: Counselor will continue to meet with patient address treatment plan goals. Patient was given a chance for recommendations of providers and implement skills learned in session.    Follow-up instructions:I discussed the assessment and treatment plan with the patient. Patient was provided an opportunity to ask questions and all were answered. The patient agreed with the plan and demonstrated an understanding of the instructions.   The patient was advised to call back or seek an in-person evaluation if the symptoms worsen or if the condition fails to improve as anticipated.  Collaboration of Care: Other No collaboration of care was needed at this session.  Patient/Guardian was advised Release of Information must be obtained prior to any record release in order to collaborate their care with  an outside provider. Patient/Guardian was advised if they have not already done so to contact the registration department to sign all necessary forms in order for [name] to release information regarding their care.   Consent: Patient/Guardian gives verbal consent for treatment and assignment of benefits for services provided during this visit. Patient/Guardian expressed understanding and agreed to proceed.      I provided 75 minutes of non-face-to-face time during this encounter.   Meziah Blasingame S, LCAS

## 2021-08-02 ENCOUNTER — Ambulatory Visit (INDEPENDENT_AMBULATORY_CARE_PROVIDER_SITE_OTHER): Payer: No Typology Code available for payment source | Admitting: Licensed Clinical Social Worker

## 2021-08-02 ENCOUNTER — Encounter (HOSPITAL_COMMUNITY): Payer: Self-pay | Admitting: Licensed Clinical Social Worker

## 2021-08-02 DIAGNOSIS — F419 Anxiety disorder, unspecified: Secondary | ICD-10-CM

## 2021-08-02 DIAGNOSIS — F4310 Post-traumatic stress disorder, unspecified: Secondary | ICD-10-CM

## 2021-08-02 DIAGNOSIS — F331 Major depressive disorder, recurrent, moderate: Secondary | ICD-10-CM | POA: Diagnosis not present

## 2021-08-02 NOTE — Progress Notes (Signed)
Virtual Visit via Video Note  I connected with Mark [name] on 08/02/21 at 2:00pm EST by video-enabled telemedicine with the correct person using two identifiers.   I discussed the limitations of evaluation and management by telemedicine and the availability of in person appointments. The patient expressed understanding and agreed to proceed.  LOCATION: Patient: home Provider: home office    History of Present Illness: Pt was referred to [facility] OP therapy for PTSD, depression and anxiety by the [facility].  Treatment Goal Addressed:   Pt will reduce irritability and increase normal social interaction with family and friends and will develop the ability to recognize, accept, and cope with feelings of depression by using practiced coping skills for depression 1-2x daily AEB self report.  Progress towards Goal:  Progressing    Observations/Objective: Patient presented for today's session on time and was alert, oriented x5, with no evidence or self-report of SI/HI or A/V H.  Patient reported ongoing compliance with medication. Clinician inquired about patient's current emotional ratings, as well as any significant changes in thoughts, feelings or behavior since previous session. Patient reported emotional rating scores of  6/10 for depression, 6/10 for anxiety, 4/10 for anger/irritability. Cln and pt reviewed emotional ratings and coping skills. Pt reports, "I'm doing better emotionally. I'm trying to find fulfillment instead of happiness. I'm finding it in different places." Clinician utilized CBT to process concerns about family expectations, limitations as an amputee, family communication,  and work challenges. Clinician processed thoughts, feelings, and behaviors. Clinician discussed coping skills and options for supporting himself through these challenging times. Cln and pt explored his gratitudes and the importance of gratitudes in his mental health.   Collaboration of Care: Other: none  needed at this session   Patient/Guardian was advised Release of Information must be obtained prior to any record release in order to collaborate their care with an outside provider. Patient/Guardian was advised if they have not already done so to contact the registration department to sign all necessary forms in order for [name] to release information regarding their care.    Consent: Patient/Guardian gives verbal consent for treatment and assignment of benefits for services provided during this visit. Patient/Guardian expressed understanding and agreed to proceed.     Assessment and Plan: Counselor will continue to meet with patient to address treatment plan goals. Patient will continue to follow recommendations of providers and implement skills learned in session.   Follow Up Instructions I discussed the assessment and treatment plan with the patient. The patient was provided an opportunity to ask questions and all were answered. The patient agreed with the plan and demonstrated an understanding of the instructions.   The patient was advised to call back or seek an in-person evaluation if the symptoms worsen or if the condition fails to improve as anticipated.  I provided 60 minutes of non-face-to-face time during this encounter.   Tarea Skillman S, LCAS  .

## 2021-08-03 ENCOUNTER — Ambulatory Visit (HOSPITAL_COMMUNITY): Payer: No Typology Code available for payment source | Admitting: Licensed Clinical Social Worker

## 2021-08-07 ENCOUNTER — Ambulatory Visit (INDEPENDENT_AMBULATORY_CARE_PROVIDER_SITE_OTHER): Payer: Medicare HMO | Admitting: Licensed Clinical Social Worker

## 2021-08-07 ENCOUNTER — Encounter (HOSPITAL_COMMUNITY): Payer: Self-pay | Admitting: Licensed Clinical Social Worker

## 2021-08-07 DIAGNOSIS — F419 Anxiety disorder, unspecified: Secondary | ICD-10-CM

## 2021-08-07 DIAGNOSIS — F331 Major depressive disorder, recurrent, moderate: Secondary | ICD-10-CM | POA: Diagnosis not present

## 2021-08-07 DIAGNOSIS — F4310 Post-traumatic stress disorder, unspecified: Secondary | ICD-10-CM | POA: Diagnosis not present

## 2021-08-07 NOTE — Progress Notes (Signed)
Virtual Visit via Video Note  I connected with Mark [name] on 08/07/2021 at 5:00 pm EST by a video enabled telemedicine application and verified that I am speaking with the correct person using two identifiers.   I discussed the limitations of evaluation and management by telemedicine and the availability of in person appointments. The patient expressed understanding and agreed to proceed.  LOCATION: Patient: Home Provider: home Office  History of Present Illness: [facility], depression, anxiety.  Treatment Goal Addressed:  Pt will develop the ability to recognize, accept, and cope with feelings of depression AEB self report.  Progress towards Goal:  Progressing    Observation/Objective:   Patient engaged in discussion on identified positives to assess how mood has been impacted. Clinician allowed space for group to further process emotions and clinician provided supportive statements throughout session for each patient. It was suggested to patient to continue to identify positives that  impact mood.    Assessment and Plan: Counselor will continue to meet with patient address treatment plan goals. Patient was given a chance for recommendations of providers and implement skills learned in session.    Follow-up instructions:I discussed the assessment and treatment plan with the patient. Patient was provided an opportunity to ask questions and all were answered. The patient agreed with the plan and demonstrated an understanding of the instructions.   The patient was advised to call back or seek an in-person evaluation if the symptoms worsen or if the condition fails to improve as anticipated.  Collaboration of Care: Other No collaboration of care was needed at this session.  Patient/Guardian was advised Release of Information must be obtained prior to any record release in order to collaborate their care with an outside provider.  Patient/Guardian was advised if they have not already done so to contact the registration department to sign all necessary forms in order for [name] to release information regarding their care.   Consent: Patient/Guardian gives verbal consent for treatment and assignment of benefits for services provided during this visit. Patient/Guardian expressed understanding and agreed to proceed.      I provided 75 minutes of non-face-to-face time during this encounter.   Mark [name] S, LCAS

## 2021-08-09 ENCOUNTER — Encounter (HOSPITAL_COMMUNITY): Payer: Self-pay | Admitting: Licensed Clinical Social Worker

## 2021-08-09 ENCOUNTER — Ambulatory Visit (INDEPENDENT_AMBULATORY_CARE_PROVIDER_SITE_OTHER): Payer: No Typology Code available for payment source | Admitting: Licensed Clinical Social Worker

## 2021-08-09 DIAGNOSIS — F331 Major depressive disorder, recurrent, moderate: Secondary | ICD-10-CM | POA: Diagnosis not present

## 2021-08-09 DIAGNOSIS — F419 Anxiety disorder, unspecified: Secondary | ICD-10-CM | POA: Diagnosis not present

## 2021-08-09 DIAGNOSIS — F4310 Post-traumatic stress disorder, unspecified: Secondary | ICD-10-CM

## 2021-08-09 NOTE — Progress Notes (Signed)
Virtual Visit via Video Note  I connected with Mark [name] on 08/09/21 at 1:00pm EST by video-enabled telemedicine with the correct person using two identifiers.   I discussed the limitations of evaluation and management by telemedicine and the availability of in person appointments. The patient expressed understanding and agreed to proceed.  LOCATION: Patient: home Provider: home office    History of Present Illness: Pt was referred to [facility] OP therapy for PTSD, depression and anxiety by the [facility].  Treatment Goal Addressed:   Pt will reduce irritability and increase normal social interaction with family and friends and will develop the ability to recognize, accept, and cope with feelings of depression by using practiced coping skills for depression 1-2x daily AEB self report.  Progress towards Goal:  Progressing    Observations/Objective: Patient presented for today's session on time and was alert, oriented x5, with no evidence or self-report of SI/HI or A/V H.  Patient reported ongoing compliance with medication. Clinician inquired about patient's current emotional ratings, as well as any significant changes in thoughts, feelings or behavior since previous session. Patient reported emotional rating scores of  6/10 for depression, 6/10 for anxiety, 4/10 for anger/irritability. Cln and pt reviewed emotional ratings and coping skills. Pt reports, "I'm getting prepared to go visit family in [place] for my birthday over the weekend." Cln and pt explored his expectations for his family while visiting, where he always has unmet expectations, which spirals into depression. Cln and pt role played possible scenarios that could trigger his depressive symptoms, identifying coping skills to use. Clinician utilized CBT to process concerns about family expectations, limitations as an amputee, family communication,  health issues and work challenges. Clinician processed thoughts, feelings, and  behaviors. Clinician discussed coping skills and options for supporting himself through these challenging times. Again, Cln suggested pt begin his day with gratitudes which can improve his mental health.   Collaboration of Care: Other: Continue seeing providers at [facility]   Patient/Guardian was advised Release of Information must be obtained prior to any record release in order to collaborate their care with an outside provider. Patient/Guardian was advised if they have not already done so to contact the registration department to sign all necessary forms in order for [name] to release information regarding their care.    Consent: Patient/Guardian gives verbal consent for treatment and assignment of benefits for services provided during this visit. Patient/Guardian expressed understanding and agreed to proceed.     Assessment and Plan: Counselor will continue to meet with patient to address treatment plan goals. Patient will continue to follow recommendations of providers and implement skills learned in session.   Follow Up Instructions I discussed the assessment and treatment plan with the patient. The patient was provided an opportunity to ask questions and all were answered. The patient agreed with the plan and demonstrated an understanding of the instructions.   The patient was advised to call back or seek an in-person evaluation if the symptoms worsen or if the condition fails to improve as anticipated.  I provided 60 minutes of non-face-to-face time during this encounter.   Eira Alpert S, LCAS  .

## 2021-08-10 ENCOUNTER — Ambulatory Visit (HOSPITAL_COMMUNITY): Payer: No Typology Code available for payment source | Admitting: Licensed Clinical Social Worker

## 2021-08-14 ENCOUNTER — Ambulatory Visit (HOSPITAL_COMMUNITY): Payer: Medicare HMO | Admitting: Licensed Clinical Social Worker

## 2021-08-16 ENCOUNTER — Ambulatory Visit (INDEPENDENT_AMBULATORY_CARE_PROVIDER_SITE_OTHER): Payer: No Typology Code available for payment source | Admitting: Licensed Clinical Social Worker

## 2021-08-16 ENCOUNTER — Encounter (HOSPITAL_COMMUNITY): Payer: Self-pay | Admitting: Licensed Clinical Social Worker

## 2021-08-16 DIAGNOSIS — F331 Major depressive disorder, recurrent, moderate: Secondary | ICD-10-CM

## 2021-08-16 DIAGNOSIS — F4310 Post-traumatic stress disorder, unspecified: Secondary | ICD-10-CM | POA: Diagnosis not present

## 2021-08-16 DIAGNOSIS — F419 Anxiety disorder, unspecified: Secondary | ICD-10-CM | POA: Diagnosis not present

## 2021-08-16 NOTE — Progress Notes (Signed)
Virtual Visit via Video Note  I connected with Mark [name] on 08/16/21 at 2:00pm EST by video-enabled telemedicine with the correct person using two identifiers.   I discussed the limitations of evaluation and management by telemedicine and the availability of in person appointments. The patient expressed understanding and agreed to proceed.  LOCATION: Patient: home Provider: home office    History of Present Illness: Pt was referred to [facility] OP therapy for PTSD, depression and anxiety by the [facility].  Treatment Goal Addressed:   Pt will reduce irritability and increase normal social interaction with family and friends and will develop the ability to recognize, accept, and cope with feelings of depression by using practiced coping skills for depression 1-2x daily AEB self report.  Progress towards Goal:  Progressing    Observations/Objective: Patient presented for today's session on time and was alert, oriented x5, with no evidence or self-report of SI/HI or A/V H.  Patient reported ongoing compliance with medication. Clinician inquired about patient's current emotional ratings, as well as any significant changes in thoughts, feelings or behavior since previous session. Patient reported emotional rating scores of  6/10 for depression, 6/10 for anxiety, 4/10 for anger/irritability. Cln and pt reviewed emotional ratings and coping skills. Pt reports on his frustration while seeing extended family for my birthday." Cln and pt reported on his expectations for his family during his visit, where I had no expectations. "I'm not spiraling into depression." Clinician utilized CBT to process concerns about family expectations, limitations as an amputee, family communication,  health issues and work challenges. Clinician processed thoughts, feelings, and behaviors. Clinician discussed coping skills and options for supporting himself through these challenging times.    Collaboration of Care: Other:  Continue seeing providers at [facility]   Patient/Guardian was advised Release of Information must be obtained prior to any record release in order to collaborate their care with an outside provider. Patient/Guardian was advised if they have not already done so to contact the registration department to sign all necessary forms in order for [name] to release information regarding their care.    Consent: Patient/Guardian gives verbal consent for treatment and assignment of benefits for services provided during this visit. Patient/Guardian expressed understanding and agreed to proceed.     Assessment and Plan: Counselor will continue to meet with patient to address treatment plan goals. Patient will continue to follow recommendations of providers and implement skills learned in session.   Follow Up Instructions I discussed the assessment and treatment plan with the patient. The patient was provided an opportunity to ask questions and all were answered. The patient agreed with the plan and demonstrated an understanding of the instructions.   The patient was advised to call back or seek an in-person evaluation if the symptoms worsen or if the condition fails to improve as anticipated.  I provided 45 minutes of non-face-to-face time during this encounter.   Rebekah Sprinkle S, LCAS  .

## 2021-08-17 ENCOUNTER — Ambulatory Visit (HOSPITAL_COMMUNITY): Payer: No Typology Code available for payment source | Admitting: Licensed Clinical Social Worker

## 2021-08-21 ENCOUNTER — Encounter (HOSPITAL_COMMUNITY): Payer: Self-pay | Admitting: Licensed Clinical Social Worker

## 2021-08-21 ENCOUNTER — Ambulatory Visit (INDEPENDENT_AMBULATORY_CARE_PROVIDER_SITE_OTHER): Payer: Medicare HMO | Admitting: Licensed Clinical Social Worker

## 2021-08-21 DIAGNOSIS — F4310 Post-traumatic stress disorder, unspecified: Secondary | ICD-10-CM | POA: Diagnosis not present

## 2021-08-21 DIAGNOSIS — F419 Anxiety disorder, unspecified: Secondary | ICD-10-CM | POA: Diagnosis not present

## 2021-08-21 DIAGNOSIS — F331 Major depressive disorder, recurrent, moderate: Secondary | ICD-10-CM

## 2021-08-21 NOTE — Progress Notes (Signed)
Virtual Visit via Video Note  I connected with Mark [name] on 08/21/2021 at 5:00 pm EST by a video enabled telemedicine application and verified that I am speaking with the correct person using two identifiers.   I discussed the limitations of evaluation and management by telemedicine and the availability of in person appointments. The patient expressed understanding and agreed to proceed.  LOCATION: Patient: Home Provider: home Office  History of Present Illness: [facility], [facility].  Treatment Goal Addressed:  Pt will develop the ability to recognize, accept, and cope with feelings of depression AEB self report.  Progress towards Goal:  Progressing    Observation/Objective:   Pt participated in a discussion on to how to redirect focus to things you can control in relationships. Cln provided psychoeducation on dynamics of healthy vs unhealthy relationships associated with power and control. Cln was encouraged to engage in healthy relationships.  Assessment and Plan: Counselor will continue to meet with patient address treatment plan goals. Patient was given a chance for recommendations of providers and implement skills learned in session.    Follow-up instructions:I discussed the assessment and treatment plan with the patient. Patient was provided an opportunity to ask questions and all were answered. The patient agreed with the plan and demonstrated an understanding of the instructions.   The patient was advised to call back or seek an in-person evaluation if the symptoms worsen or if the condition fails to improve as anticipated.  Collaboration of Care: Other No collaboration of care was needed at this session.  Patient/Guardian was advised Release of Information must be obtained prior to any record release in order to collaborate their care with an outside provider. Patient/Guardian was advised if they have not  already done so to contact the registration department to sign all necessary forms in order for [name] to release information regarding their care.   Consent: Patient/Guardian gives verbal consent for treatment and assignment of benefits for services provided during this visit. Patient/Guardian expressed understanding and agreed to proceed.      I provided 75 minutes of non-face-to-face time during this encounter.   Maryetta Shafer S, LCAS

## 2021-08-23 ENCOUNTER — Ambulatory Visit (INDEPENDENT_AMBULATORY_CARE_PROVIDER_SITE_OTHER): Payer: No Typology Code available for payment source | Admitting: Licensed Clinical Social Worker

## 2021-08-23 ENCOUNTER — Encounter (HOSPITAL_COMMUNITY): Payer: Self-pay | Admitting: Licensed Clinical Social Worker

## 2021-08-23 DIAGNOSIS — F419 Anxiety disorder, unspecified: Secondary | ICD-10-CM | POA: Diagnosis not present

## 2021-08-23 DIAGNOSIS — F331 Major depressive disorder, recurrent, moderate: Secondary | ICD-10-CM | POA: Diagnosis not present

## 2021-08-23 DIAGNOSIS — F4310 Post-traumatic stress disorder, unspecified: Secondary | ICD-10-CM | POA: Diagnosis not present

## 2021-08-23 NOTE — Progress Notes (Signed)
Virtual Visit via Video Note  I connected with Mark [name] on 08/23/21 at 2:00pm EST by video-enabled telemedicine with the correct person using two identifiers.   I discussed the limitations of evaluation and management by telemedicine and the availability of in person appointments. The patient expressed understanding and agreed to proceed.  LOCATION: Patient: home Provider: home office    History of Present Illness: Pt was referred to [facility] OP therapy for PTSD, depression and anxiety by the [facility].  Treatment Goal Addressed:   Pt will reduce irritability and increase normal social interaction with family and friends and will develop the ability to recognize, accept, and cope with feelings of depression by using practiced coping skills for depression 1-2x daily AEB self report.  Progress towards Goal:  Progressing    Observations/Objective: Patient presented for today's session on time and was alert, oriented x5, with no evidence or self-report of SI/HI or A/V H.  Patient reported ongoing compliance with medication. Clinician inquired about patient's current emotional ratings, as well as any significant changes in thoughts, feelings or behavior since previous session. Patient reported emotional rating scores of  4/10 for depression, 4/10 for anxiety, 3/10 for anger/irritability. Cln and pt reviewed emotional ratings and coping skills. Pt reports "Im going on a men's retreat tomorrow with my church [place] church[place] I'm really excited and I really need this to help with stabilizing  my moods."  Pt reports, "I'm starting an anxiety group Monday at the [place].Cln asked open ended questions. Clinician utilized CBT to process concerns and work challenges. Clinician processed thoughts, feelings, and behaviors. Clinician discussed coping skills and options for supporting himself through these challenging times.    Collaboration of Care: Other: Continue seeing providers at [facility]    Patient/Guardian was advised Release of Information must be obtained prior to any record release in order to collaborate their care with an outside provider. Patient/Guardian was advised if they have not already done so to contact the registration department to sign all necessary forms in order for [name] to release information regarding their care.    Consent: Patient/Guardian gives verbal consent for treatment and assignment of benefits for services provided during this visit. Patient/Guardian expressed understanding and agreed to proceed.     Assessment and Plan: Counselor will continue to meet with patient to address treatment plan goals. Patient will continue to follow recommendations of providers and implement skills learned in session.   Follow Up Instructions I discussed the assessment and treatment plan with the patient. The patient was provided an opportunity to ask questions and all were answered. The patient agreed with the plan and demonstrated an understanding of the instructions.   The patient was advised to call back or seek an in-person evaluation if the symptoms worsen or if the condition fails to improve as anticipated.  I provided 45 minutes of non-face-to-face time during this encounter.   Triton Heidrich S, LCAS  .

## 2021-08-24 ENCOUNTER — Ambulatory Visit (HOSPITAL_COMMUNITY): Payer: No Typology Code available for payment source | Admitting: Licensed Clinical Social Worker

## 2021-08-28 ENCOUNTER — Encounter (HOSPITAL_COMMUNITY): Payer: Self-pay | Admitting: Licensed Clinical Social Worker

## 2021-08-28 ENCOUNTER — Ambulatory Visit (INDEPENDENT_AMBULATORY_CARE_PROVIDER_SITE_OTHER): Payer: Medicare HMO | Admitting: Licensed Clinical Social Worker

## 2021-08-28 DIAGNOSIS — F419 Anxiety disorder, unspecified: Secondary | ICD-10-CM | POA: Diagnosis not present

## 2021-08-28 DIAGNOSIS — F331 Major depressive disorder, recurrent, moderate: Secondary | ICD-10-CM | POA: Diagnosis not present

## 2021-08-28 DIAGNOSIS — F4310 Post-traumatic stress disorder, unspecified: Secondary | ICD-10-CM

## 2021-08-28 NOTE — Progress Notes (Signed)
Virtual Visit via Video Note  I connected with Mark [name] on 08/28/2021 at 5:00 pm EST by a video enabled telemedicine application and verified that I am speaking with the correct person using two identifiers.   I discussed the limitations of evaluation and management by telemedicine and the availability of in person appointments. The patient expressed understanding and agreed to proceed.  LOCATION: Patient: Home Provider: home Office  History of Present Illness: [facility].  Treatment Goal Addressed:  Pt will develop the ability to recognize, accept, and cope with feelings of depression AEB self report.  Progress towards Goal:  Progressing    Observation/Objective:   Patient participated in a discussion on dealing with health issues, including chronic pain. Cln used CBT to assist patient in improving the quality of life, activities of daily living, focusing on reducing stress by modifying physical sensation, catastrophic thinking and maladaptive behaviors. Patient was encouraged to improve quality of life.  Assessment and Plan: Counselor will continue to meet with patient address treatment plan goals. Patient was given a chance for recommendations of providers and implement skills learned in session.    Follow-up instructions:I discussed the assessment and treatment plan with the patient. Patient was provided an opportunity to ask questions and all were answered. The patient agreed with the plan and demonstrated an understanding of the instructions.   The patient was advised to call back or seek an in-person evaluation if the symptoms worsen or if the condition fails to improve as anticipated.  Collaboration of Care: Other No collaboration of care was needed at this session.  Patient/Guardian was advised Release of Information must be obtained prior to any record release in order to collaborate their care with  an outside provider. Patient/Guardian was advised if they have not already done so to contact the registration department to sign all necessary forms in order for [name] to release information regarding their care.   Consent: Patient/Guardian gives verbal consent for treatment and assignment of benefits for services provided during this visit. Patient/Guardian expressed understanding and agreed to proceed.      I provided 75 minutes of non-face-to-face time during this encounter.   Oceania Noori S, LCAS

## 2021-08-29 ENCOUNTER — Ambulatory Visit (INDEPENDENT_AMBULATORY_CARE_PROVIDER_SITE_OTHER): Payer: No Typology Code available for payment source | Admitting: Licensed Clinical Social Worker

## 2021-08-29 ENCOUNTER — Encounter (HOSPITAL_COMMUNITY): Payer: Self-pay | Admitting: Licensed Clinical Social Worker

## 2021-08-29 DIAGNOSIS — F419 Anxiety disorder, unspecified: Secondary | ICD-10-CM | POA: Diagnosis not present

## 2021-08-29 DIAGNOSIS — F331 Major depressive disorder, recurrent, moderate: Secondary | ICD-10-CM

## 2021-08-29 DIAGNOSIS — F4310 Post-traumatic stress disorder, unspecified: Secondary | ICD-10-CM | POA: Diagnosis not present

## 2021-08-29 NOTE — Progress Notes (Signed)
Virtual Visit via Video Note  I connected with Mark [name] on 08/29/21 at 10:00am EST by video-enabled telemedicine with the correct person using two identifiers.   I discussed the limitations of evaluation and management by telemedicine and the availability of in person appointments. The patient expressed understanding and agreed to proceed.  LOCATION: Patient: home Provider: home office    History of Present Illness: Pt was referred to [facility] OP therapy for PTSD, depression and anxiety by the [facility].  Treatment Goal Addressed:   Pt will reduce irritability and increase normal social interaction with family and friends and will develop the ability to recognize, accept, and cope with feelings of depression by using practiced coping skills for depression 1-2x daily AEB self report.  Progress towards Goal:  Progressing    Observations/Objective: Patient presented for today's session on time and was alert, oriented x5, with no evidence or self-report of SI/HI or A/V H.  Patient reported ongoing compliance with medication. Clinician inquired about patient's current emotional ratings, as well as any significant changes in thoughts, feelings or behavior since previous session. Patient reported emotional rating scores of  4/10 for depression, 4/10 for anxiety, 3/10 for anger/irritability. Cln and pt reviewed emotional ratings and coping skills. Pt reports "I went on the men's retreat over the weekend. It was wonderful and it has made me feel so good. I'm trying to hang on to those feelings. I'm waiting for the other shoe to drop." CLn provided education on "staying in the positive, enjoying the good feelings."  Pt reports, "I'm starting an anxiety group today at the [place]. Clinician utilized CBT to process concerns and work challenges. Clinician processed thoughts, feelings, and behaviors. Clinician discussed coping skills and options for supporting himself through these challenging times.     Collaboration of Care: Other: Continue seeing providers at [facility]   Patient/Guardian was advised Release of Information must be obtained prior to any record release in order to collaborate their care with an outside provider. Patient/Guardian was advised if they have not already done so to contact the registration department to sign all necessary forms in order for [name] to release information regarding their care.    Consent: Patient/Guardian gives verbal consent for treatment and assignment of benefits for services provided during this visit. Patient/Guardian expressed understanding and agreed to proceed.     Assessment and Plan: Counselor will continue to meet with patient to address treatment plan goals. Patient will continue to follow recommendations of providers and implement skills learned in session.   Follow Up Instructions I discussed the assessment and treatment plan with the patient. The patient was provided an opportunity to ask questions and all were answered. The patient agreed with the plan and demonstrated an understanding of the instructions.   The patient was advised to call back or seek an in-person evaluation if the symptoms worsen or if the condition fails to improve as anticipated.  I provided 45 minutes of non-face-to-face time during this encounter.   Raymonde Hamblin S, LCAS  .

## 2021-08-30 ENCOUNTER — Ambulatory Visit (HOSPITAL_COMMUNITY): Payer: No Typology Code available for payment source | Admitting: Licensed Clinical Social Worker

## 2021-09-04 ENCOUNTER — Encounter (HOSPITAL_COMMUNITY): Payer: Self-pay | Admitting: Licensed Clinical Social Worker

## 2021-09-04 ENCOUNTER — Ambulatory Visit (INDEPENDENT_AMBULATORY_CARE_PROVIDER_SITE_OTHER): Payer: Medicare HMO | Admitting: Licensed Clinical Social Worker

## 2021-09-04 DIAGNOSIS — F331 Major depressive disorder, recurrent, moderate: Secondary | ICD-10-CM | POA: Diagnosis not present

## 2021-09-04 DIAGNOSIS — F4310 Post-traumatic stress disorder, unspecified: Secondary | ICD-10-CM | POA: Diagnosis not present

## 2021-09-04 DIAGNOSIS — F419 Anxiety disorder, unspecified: Secondary | ICD-10-CM

## 2021-09-04 NOTE — Progress Notes (Signed)
Virtual Visit via Video Note  I connected with Mark [name] on 09/04/2021 at 5:00 pm EST by a video enabled telemedicine application and verified that I am speaking with the correct person using two identifiers.   I discussed the limitations of evaluation and management by telemedicine and the availability of in person appointments. The patient expressed understanding and agreed to proceed.  LOCATION: Patient: Home Provider: home Office  History of Present Illness: [facility], [facility], [facility].  Treatment Goal Addressed:  Pt will develop the ability to recognize, accept, and cope with feelings of depression AEB self report.  Progress towards Goal:  Progressing    Observation/Objective:  Pt participated in a discussion on the skills of self-awareness, types and insights.  Pt was encouraged to  improved self awareness.   Assessment and Plan: Counselor will continue to meet with patient address treatment plan goals. Patient was given a chance for recommendations of providers and implement skills learned in session.    Follow-up instructions:I discussed the assessment and treatment plan with the patient. Patient was provided an opportunity to ask questions and all were answered. The patient agreed with the plan and demonstrated an understanding of the instructions.   The patient was advised to call back or seek an in-person evaluation if the symptoms worsen or if the condition fails to improve as anticipated.  Collaboration of Care: Other No collaboration of care was needed at this session.  Patient/Guardian was advised Release of Information must be obtained prior to any record release in order to collaborate their care with an outside provider. Patient/Guardian was advised if they have not already done so to contact the registration department to sign all necessary forms in order for [name] to release information regarding their care.    Consent: Patient/Guardian gives verbal consent for treatment and assignment of benefits for services provided during this visit. Patient/Guardian expressed understanding and agreed to proceed.      I provided 75 minutes of non-face-to-face time during this encounter.   Mark [name], LCAS

## 2021-09-05 ENCOUNTER — Ambulatory Visit (HOSPITAL_COMMUNITY): Payer: No Typology Code available for payment source | Admitting: Licensed Clinical Social Worker

## 2021-09-06 ENCOUNTER — Ambulatory Visit (HOSPITAL_COMMUNITY): Payer: No Typology Code available for payment source | Admitting: Licensed Clinical Social Worker

## 2021-09-11 ENCOUNTER — Encounter (HOSPITAL_COMMUNITY): Payer: Self-pay | Admitting: Licensed Clinical Social Worker

## 2021-09-11 ENCOUNTER — Ambulatory Visit (INDEPENDENT_AMBULATORY_CARE_PROVIDER_SITE_OTHER): Payer: Medicare HMO | Admitting: Licensed Clinical Social Worker

## 2021-09-11 DIAGNOSIS — F419 Anxiety disorder, unspecified: Secondary | ICD-10-CM | POA: Diagnosis not present

## 2021-09-11 DIAGNOSIS — F4310 Post-traumatic stress disorder, unspecified: Secondary | ICD-10-CM | POA: Diagnosis not present

## 2021-09-11 DIAGNOSIS — F331 Major depressive disorder, recurrent, moderate: Secondary | ICD-10-CM | POA: Diagnosis not present

## 2021-09-11 NOTE — Progress Notes (Signed)
Virtual Visit via Video Note  I connected with Mark [name] on 09/11/2021 at 5:00 pm EST by a video enabled telemedicine application and verified that I am speaking with the correct person using two identifiers.   I discussed the limitations of evaluation and management by telemedicine and the availability of in person appointments. The patient expressed understanding and agreed to proceed.  LOCATION: Patient: Home Provider: home Office  History of Present Illness: [facility], [facility], [facility].  Treatment Goal Addressed:  Pt will develop the ability to recognize, accept, and cope with feelings of depression AEB self report.  Progress towards Goal:  Progressing    Observation/Objective:  The patient participated in a discussion on the core premise of CBT is that maladaptive cognitions contribute to the maintenance of emotional distress and behavioral problems.[30] Previous studies have shown that CBT is effective for depression, anxiety, stress, and chronic pain. Patient was encouraged to use CBT for his maladaptive cognitions.   Assessment and Plan: Counselor will continue to meet with patient address treatment plan goals. Patient was given a chance for recommendations of providers and implement skills learned in session.    Follow-up instructions:I discussed the assessment and treatment plan with the patient. Patient was provided an opportunity to ask questions and all were answered. The patient agreed with the plan and demonstrated an understanding of the instructions.   The patient was advised to call back or seek an in-person evaluation if the symptoms worsen or if the condition fails to improve as anticipated.  Collaboration of Care: Other No collaboration of care was needed at this session.  Patient/Guardian was advised Release of Information must be obtained prior to any record release in order to collaborate their care  with an outside provider. Patient/Guardian was advised if they have not already done so to contact the registration department to sign all necessary forms in order for [name] to release information regarding their care.   Consent: Patient/Guardian gives verbal consent for treatment and assignment of benefits for services provided during this visit. Patient/Guardian expressed understanding and agreed to proceed.      I provided 75 minutes of non-face-to-face time during this encounter.   Shawan Corella S, LCAS

## 2021-09-13 ENCOUNTER — Ambulatory Visit (HOSPITAL_COMMUNITY): Payer: No Typology Code available for payment source | Admitting: Licensed Clinical Social Worker

## 2021-09-18 ENCOUNTER — Ambulatory Visit (INDEPENDENT_AMBULATORY_CARE_PROVIDER_SITE_OTHER): Payer: No Typology Code available for payment source | Admitting: Licensed Clinical Social Worker

## 2021-09-18 DIAGNOSIS — F419 Anxiety disorder, unspecified: Secondary | ICD-10-CM | POA: Diagnosis not present

## 2021-09-18 DIAGNOSIS — F4310 Post-traumatic stress disorder, unspecified: Secondary | ICD-10-CM

## 2021-09-18 DIAGNOSIS — F331 Major depressive disorder, recurrent, moderate: Secondary | ICD-10-CM | POA: Diagnosis not present

## 2021-09-20 ENCOUNTER — Ambulatory Visit (INDEPENDENT_AMBULATORY_CARE_PROVIDER_SITE_OTHER): Payer: Medicare HMO | Admitting: Licensed Clinical Social Worker

## 2021-09-20 DIAGNOSIS — F4310 Post-traumatic stress disorder, unspecified: Secondary | ICD-10-CM

## 2021-09-20 DIAGNOSIS — F419 Anxiety disorder, unspecified: Secondary | ICD-10-CM | POA: Diagnosis not present

## 2021-09-20 DIAGNOSIS — F331 Major depressive disorder, recurrent, moderate: Secondary | ICD-10-CM | POA: Diagnosis not present

## 2021-09-23 ENCOUNTER — Encounter (HOSPITAL_COMMUNITY): Payer: Self-pay | Admitting: Licensed Clinical Social Worker

## 2021-09-23 NOTE — Progress Notes (Signed)
Virtual Visit via Video Note  I connected with Mark [name] on 06/22/21 at 2:00pm EST by video-enabled telemedicine with the correct person using two identifiers.   I discussed the limitations of evaluation and management by telemedicine and the availability of in person appointments. The patient expressed understanding and agreed to proceed.  LOCATION: Patient: home Provider: home office    History of Present Illness: Pt was referred to [facility] OP therapy for PTSD, depression and anxiety by the [facility].  Treatment Goal Addressed:   Pt will reduce irritability and increase normal social interaction with family and friends and will develop the ability to recognize, accept, and cope with feelings of depression by using practiced coping skills for depression 1-2x daily AEB self report.  Progress towards Goal:  Progressing    Observations/Objective: Patient presented for today'[name] session on time and was alert, oriented x5, with no evidence or self-report of SI/HI or A/V H.  Patient reported ongoing compliance with medication. Clinician inquired about patient'[name] current emotional ratings, as well as any significant changes in thoughts, feelings or behavior since previous session. Patient reported emotional rating scores of  6/10 for depression, 6/10 for anxiety, 3/10 for anger/irritability. Cln and pt reviewed emotional ratings and coping skills. Pt reports, "My emotional ratings have come down some. I've been very busy this week, which has helped with the loneliness, which has helped with minimizing my depressive, anxiety and irritability symptoms. Cln and pt explored the benefits of scheduling him time to keep busy, however over-scheduling will be a detriment to his mental health. Patient provided an update: family, low frustration tolerance, health, finances, depressive symptoms, anxiety. Again, Cln and pt reviewed other coping skills that may work: Getting out of the house, going to gym, cooking.  What doesn't work: retail therapy, overeating, feeling sorry for self. Clinician validated importance of  Self-compassion and positive self talk.     PLAN: [facility] re[facility], [employer], classes at the [employer] of Care: Other: [facility] for [facility] and [employer] questions   Patient/Guardian was advised Release of Information must be obtained prior to any record release in order to collaborate their care with an outside provider. Patient/Guardian was advised if they have not already done so to contact the registration department to sign all necessary forms in order for [name] to release information regarding their care.    Consent: Patient/Guardian gives verbal consent for treatment and assignment of benefits for services provided during this visit. Patient/Guardian expressed understanding and agreed to proceed.     Assessment and Plan: Counselor will continue to meet with patient to address treatment plan goals. Patient will continue to follow recommendations of providers and implement skills learned in session.   Follow Up Instructions I discussed the assessment and treatment plan with the patient. The patient was provided an opportunity to ask questions and all were answered. The patient agreed with the plan and demonstrated an understanding of the instructions.   The patient was advised to call back or seek an in-person evaluation if the symptoms worsen or if the condition fails to improve as anticipated.  I provided 60 minutes of non-face-to-face time during this encounter.   Mark [name], LCAS  .

## 2021-09-24 NOTE — Addendum Note (Signed)
Addended by: Vernona Rieger on: 09/24/2021 01:18 PM   Modules accepted: Level of Service

## 2021-09-24 NOTE — Addendum Note (Signed)
Addended by: Vernona Rieger on: 09/24/2021 01:12 PM   Modules accepted: Level of Service

## 2021-09-24 NOTE — Addendum Note (Signed)
Addended by: Vernona Rieger on: 09/24/2021 01:11 PM   Modules accepted: Level of Service

## 2021-09-24 NOTE — Addendum Note (Signed)
Addended by: Vernona Rieger on: 09/24/2021 01:13 PM   Modules accepted: Level of Service

## 2021-09-24 NOTE — Addendum Note (Signed)
Addended by: Vernona Rieger on: 09/24/2021 01:14 PM   Modules accepted: Level of Service

## 2021-09-24 NOTE — Addendum Note (Signed)
Addended by: Vernona Rieger on: 09/24/2021 01:20 PM   Modules accepted: Level of Service

## 2021-09-25 NOTE — Progress Notes (Signed)
Virtual Visit via Video Note  I connected with Mark [name] on 06/15/21 at 2:00pm EST by video-enabled telemedicine with the correct person using two identifiers.   I discussed the limitations of evaluation and management by telemedicine and the availability of in person appointments. The patient expressed understanding and agreed to proceed.  LOCATION: Patient: home Provider: home office    History of Present Illness: Pt was referred to [facility] OP therapy for PTSD, depression and anxiety by the [facility].  Treatment Goal Addressed:   Pt will reduce irritability and increase normal social interaction with family and friends and will develop the ability to recognize, accept, and cope with feelings of depression by using practiced coping skills for depression 1-2x daily AEB self report.  Progress towards Goal:  Progressing    Observations/Objective: Patient presented for today's session on time and was alert, oriented x5, with no evidence or self-report of SI/HI or A/V H.  Patient reported ongoing compliance with medication. Clinician inquired about patient's current emotional ratings, as well as any significant changes in thoughts, feelings or behavior since previous session. Patient reported emotional rating scores of  6/10 for depression, 6/10 for anxiety, 3/10 for anger/irritability. Cln and pt reviewed emotional ratings and coping skills. Pt reports, "My emotional ratings have been mixed this past week." Patient provided an update: family, low frustration tolerance, health, finances, depressive symptoms, anxiety. Cln used CBT to assist in identifying positives to assess how his mood has been impacted. Clinician allowed space for patient to further process emotions and clinician provided supportive statements throughout session. It was suggested to patient to continue to identify positives that  impact mood daily. Cln provided education on how to to recognize the physical, cognitive,  emotional, and behavioral responses that influence his mood. Pt agreed to a mood journal. Still awaiting reauthorization from [facility] community care.       PLAN: [facility] reauthorization, [employer], classes at the [employer] of Care: Other: [facility] for [facility]ization and [employer] questions   Patient/Guardian was advised Release of Information must be obtained prior to any record release in order to collaborate their care with an outside provider. Patient/Guardian was advised if they have not already done so to contact the registration department to sign all necessary forms in order for [name] to release information regarding their care.    Consent: Patient/Guardian gives verbal consent for treatment and assignment of benefits for services provided during this visit. Patient/Guardian expressed understanding and agreed to proceed.     Assessment and Plan: Counselor will continue to meet with patient to address treatment plan goals. Patient will continue to follow recommendations of providers and implement skills learned in session.   Follow Up Instructions I discussed the assessment and treatment plan with the patient. The patient was provided an opportunity to ask questions and all were answered. The patient agreed with the plan and demonstrated an understanding of the instructions.   The patient was advised to call back or seek an in-person evaluation if the symptoms worsen or if the condition fails to improve as anticipated.  I provided 30 minutes of non-face-to-face time during this encounter.   Kendell Gammon S, LCAS  .

## 2021-09-27 ENCOUNTER — Encounter (HOSPITAL_COMMUNITY): Payer: Self-pay | Admitting: Licensed Clinical Social Worker

## 2021-09-27 ENCOUNTER — Ambulatory Visit (INDEPENDENT_AMBULATORY_CARE_PROVIDER_SITE_OTHER): Payer: Medicare HMO | Admitting: Licensed Clinical Social Worker

## 2021-09-27 DIAGNOSIS — F419 Anxiety disorder, unspecified: Secondary | ICD-10-CM | POA: Diagnosis not present

## 2021-09-27 DIAGNOSIS — F4310 Post-traumatic stress disorder, unspecified: Secondary | ICD-10-CM | POA: Diagnosis not present

## 2021-09-27 DIAGNOSIS — F331 Major depressive disorder, recurrent, moderate: Secondary | ICD-10-CM

## 2021-09-27 NOTE — Progress Notes (Signed)
Virtual Visit via Video Note  I connected with Mark [name] on 09/27/21 at 2:00 pm EST by video-enabled telemedicine with the correct person using two identifiers.   I discussed the limitations of evaluation and management by telemedicine and the availability of in person appointments. The patient expressed understanding and agreed to proceed.  LOCATION: Patient: home Provider: home office    History of Present Illness: Pt was referred to [facility] OP therapy for PTSD, depression and anxiety by the [facility].  Treatment Goal Addressed:   Pt will reduce irritability and increase normal social interaction with family and friends and will develop the ability to recognize, accept, and cope with feelings of depression by using practiced coping skills for depression 1-2x daily AEB self report.  Progress towards Goal:  Progressing    Observations/Objective: Patient presented for today's session on time and was alert, oriented x5, with no evidence or self-report of SI/HI or A/V H.  Patient reported ongoing compliance with medication. Clinician inquired about patient's current emotional ratings, as well as any significant changes in thoughts, feelings or behavior since previous session. Patient reported emotional rating scores of  5/10 for depression, 5/10 for anxiety, 3/10 for anger/irritability. Cln and pt reviewed emotional ratings and coping skills. Pt provides updates on work, depressive symptoms, family, grown children, anxiety, isolation, wheelchair lift still broken. "I'm stuck in the house because my wheelchair lift continues to be broken. I'm isolating with minimal contact with anyone. I'm sure that's exacerbated my depressive symptoms." Cln and pt reviewed anxiety worksheets/booklet provided by [facility] anxiety group.Clinician utilized CBT to process concerns and work challenges. Clinician processed thoughts, feelings, and behaviors. Clinician discussed coping skills and options for supporting himself  through these challenging times.    Collaboration of Care: Other: Continue seeing providers at [facility]   Patient/Guardian was advised Release of Information must be obtained prior to any record release in order to collaborate their care with an outside provider. Patient/Guardian was advised if they have not already done so to contact the registration department to sign all necessary forms in order for [name] to release information regarding their care.    Consent: Patient/Guardian gives verbal consent for treatment and assignment of benefits for services provided during this visit. Patient/Guardian expressed understanding and agreed to proceed.     Assessment and Plan: Counselor will continue to meet with patient to address treatment plan goals. Patient will continue to follow recommendations of providers and implement skills learned in session.   Follow Up Instructions I discussed the assessment and treatment plan with the patient. The patient was provided an opportunity to ask questions and all were answered. The patient agreed with the plan and demonstrated an understanding of the instructions.   The patient was advised to call back or seek an in-person evaluation if the symptoms worsen or if the condition fails to improve as anticipated.  I provided 45 minutes of non-face-to-face time during this encounter.   Laron Angelini S, LCAS  .

## 2021-10-02 ENCOUNTER — Ambulatory Visit (INDEPENDENT_AMBULATORY_CARE_PROVIDER_SITE_OTHER): Payer: Medicare HMO | Admitting: Licensed Clinical Social Worker

## 2021-10-02 ENCOUNTER — Encounter (HOSPITAL_COMMUNITY): Payer: Self-pay | Admitting: Licensed Clinical Social Worker

## 2021-10-02 DIAGNOSIS — F419 Anxiety disorder, unspecified: Secondary | ICD-10-CM

## 2021-10-02 DIAGNOSIS — F4310 Post-traumatic stress disorder, unspecified: Secondary | ICD-10-CM

## 2021-10-02 DIAGNOSIS — F331 Major depressive disorder, recurrent, moderate: Secondary | ICD-10-CM | POA: Diagnosis not present

## 2021-10-02 NOTE — Progress Notes (Signed)
Virtual Visit via Video Note  I connected with Mark [name] on 10/02/2021 at 5:00 pm EST by a video enabled telemedicine application and verified that I am speaking with the correct person using two identifiers.   I discussed the limitations of evaluation and management by telemedicine and the availability of in person appointments. The patient expressed understanding and agreed to proceed.  LOCATION: Patient: Home Provider: home Office  History of Present Illness: [facility], depression, anxiety.  Treatment Goal Addressed:  Pt will develop the ability to recognize, accept, and cope with feelings of depression AEB self report.  Progress towards Goal:  Progressing    Observation/Objective:  Patient participated in a discussion about the anniversary of 9/11 and how it affected veteran. [name] shared his feelings about the anniversary, how it affected him at the time and how it affects him today. 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 Cln thanks veteran for his service.     Assessment and Plan: Counselor will continue to meet with patient address treatment plan goals. Patient was given a chance for recommendations of providers and implement skills learned in session.    Follow-up instructions:I discussed the assessment and treatment plan with the patient. Patient was provided an opportunity to ask questions and all were answered. The patient agreed with the plan and demonstrated an understanding of the instructions.   The patient was advised to call back or seek an in-person evaluation if the symptoms worsen or if the condition fails to improve as anticipated.  Collaboration of Care: Other No collaboration of care was needed at this session.  Patient/Guardian was advised Release of Information must be obtained prior to any record release in order to collaborate their care with an outside provider.  Patient/Guardian was advised if they have not already done so to contact the registration department to sign all necessary forms in order for [name] to release information regarding their care.   Consent: Patient/Guardian gives verbal consent for treatment and assignment of benefits for services provided during this visit. Patient/Guardian expressed understanding and agreed to proceed.      I provided 75 minutes of non-face-to-face time during this encounter.   Ledia Hanford S, LCAS

## 2021-10-03 ENCOUNTER — Ambulatory Visit (INDEPENDENT_AMBULATORY_CARE_PROVIDER_SITE_OTHER): Payer: Medicare HMO | Admitting: Licensed Clinical Social Worker

## 2021-10-03 DIAGNOSIS — F419 Anxiety disorder, unspecified: Secondary | ICD-10-CM | POA: Diagnosis not present

## 2021-10-03 DIAGNOSIS — F331 Major depressive disorder, recurrent, moderate: Secondary | ICD-10-CM

## 2021-10-03 DIAGNOSIS — F4310 Post-traumatic stress disorder, unspecified: Secondary | ICD-10-CM

## 2021-10-04 ENCOUNTER — Ambulatory Visit (HOSPITAL_COMMUNITY): Payer: No Typology Code available for payment source | Admitting: Licensed Clinical Social Worker

## 2021-10-07 ENCOUNTER — Encounter (HOSPITAL_COMMUNITY): Payer: Self-pay | Admitting: Licensed Clinical Social Worker

## 2021-10-09 ENCOUNTER — Encounter (HOSPITAL_COMMUNITY): Payer: Self-pay | Admitting: Licensed Clinical Social Worker

## 2021-10-09 ENCOUNTER — Ambulatory Visit (INDEPENDENT_AMBULATORY_CARE_PROVIDER_SITE_OTHER): Payer: Medicare HMO | Admitting: Licensed Clinical Social Worker

## 2021-10-09 DIAGNOSIS — F419 Anxiety disorder, unspecified: Secondary | ICD-10-CM

## 2021-10-09 DIAGNOSIS — F4310 Post-traumatic stress disorder, unspecified: Secondary | ICD-10-CM

## 2021-10-09 DIAGNOSIS — F331 Major depressive disorder, recurrent, moderate: Secondary | ICD-10-CM | POA: Diagnosis not present

## 2021-10-09 NOTE — Progress Notes (Signed)
Virtual Visit via Video Note  I connected with Mark [name] on 10/09/2021 at 5:00 pm EST by a video enabled telemedicine application and verified that I am speaking with the correct person using two identifiers.   I discussed the limitations of evaluation and management by telemedicine and the availability of in person appointments. The patient expressed understanding and agreed to proceed.  LOCATION: Patient: Home Provider: home Office  History of Present Illness: [facility], depression, anxiety.  Treatment Goal Addressed:  Pt will develop the ability to recognize, accept, and cope with feelings of depression AEB self report.  Progress towards Goal:  Progressing    Observation/Objective:  Patient participated in a group discussion on depression. Clinician provided education on the cycle of depression, depression medications available and an alternative:  [place].  Patient was encouraged to know triggers, symptoms and available coping skills.  Assessment and Plan: Counselor will continue to meet with patient address treatment plan goals. Patient was given a chance for recommendations of providers and implement skills learned in session.    Follow-up instructions:I discussed the assessment and treatment plan with the patient. Patient was provided an opportunity to ask questions and all were answered. The patient agreed with the plan and demonstrated an understanding of the instructions.   The patient was advised to call back or seek an in-person evaluation if the symptoms worsen or if the condition fails to improve as anticipated.  Collaboration of Care: Other No collaboration of care was needed at this session.  Patient/Guardian was advised Release of Information must be obtained prior to any record release in order to collaborate their care with an outside provider. Patient/Guardian was advised if they have not already done so to  contact the registration department to sign all necessary forms in order for [name] to release information regarding their care.   Consent: Patient/Guardian gives verbal consent for treatment and assignment of benefits for services provided during this visit. Patient/Guardian expressed understanding and agreed to proceed.      I provided 75 minutes of non-face-to-face time during this encounter.   Almas Rake S, LCAS

## 2021-10-11 ENCOUNTER — Encounter (HOSPITAL_COMMUNITY): Payer: Self-pay | Admitting: Licensed Clinical Social Worker

## 2021-10-11 ENCOUNTER — Ambulatory Visit (INDEPENDENT_AMBULATORY_CARE_PROVIDER_SITE_OTHER): Payer: Medicare HMO | Admitting: Licensed Clinical Social Worker

## 2021-10-11 DIAGNOSIS — F331 Major depressive disorder, recurrent, moderate: Secondary | ICD-10-CM | POA: Diagnosis not present

## 2021-10-11 DIAGNOSIS — F419 Anxiety disorder, unspecified: Secondary | ICD-10-CM | POA: Diagnosis not present

## 2021-10-11 DIAGNOSIS — F4310 Post-traumatic stress disorder, unspecified: Secondary | ICD-10-CM

## 2021-10-11 NOTE — Progress Notes (Signed)
Virtual Visit via Video Note  I connected with Mark [name] on 10/11/21 at 2:00pm EST by video-enabled telemedicine with the correct person using two identifiers.   I discussed the limitations of evaluation and management by telemedicine and the availability of in person appointments. The patient expressed understanding and agreed to proceed.  LOCATION: Patient: home Provider: home office    History of Present Illness: Pt was referred to [facility] OP therapy for PTSD, depression and anxiety by the [facility].  Treatment Goal Addressed:   Pt will reduce irritability and increase normal social interaction with family and friends and will develop the ability to recognize, accept, and cope with feelings of depression by using practiced coping skills for depression 1-2x daily AEB self report.  Progress towards Goal:  Progressing    Observations/Objective: Patient presented for today's session on time and was alert, oriented x5, with no evidence or self-report of SI/HI or A/V H.  Patient reported ongoing compliance with medication. Clinician inquired about patient's current emotional ratings, as well as any significant changes in thoughts, feelings or behavior since previous session. Patient reported emotional rating scores of  5/10 for depression, 5/10 for anxiety, 4/10 for anger/irritability. Cln and pt reviewed emotional ratings and coping skills. 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 Cln and pt reviewed Bi-polar disorder, where he recognized he meets most of the criteria.  Patient participated in a discussion on using mindfulness-based stress response to assist patient in lowering the stress response. Patient was encouraged to use mindfulness skills as a coping skill to assist in lowering stress response and bipolar symptoms.Clinician utilized CBT to process concerns and challenges. Clinician processed thoughts, feelings, and behaviors. Clinician discussed coping skills and  options for supporting himself through these challenging times.    Collaboration of Care: Other: Continue seeing providers at [place] was advised Release of Information must be obtained prior to any record release in order to collaborate their care with an outside provider. Patient/Guardian was advised if they have not already done so to contact the registration department to sign all necessary forms in order for [name] to release information regarding their care.    Consent: Patient/Guardian gives verbal consent for treatment and assignment of benefits for services provided during this visit. Patient/Guardian expressed understanding and agreed to proceed.     Assessment and Plan: Counselor will continue to meet with patient to address treatment plan goals. Patient will continue to follow recommendations of providers and implement skills learned in session.   Follow Up Instructions I discussed the assessment and treatment plan with the patient. The patient was provided an opportunity to ask questions and all were answered. The patient agreed with the plan and demonstrated an understanding of the instructions.   The patient was advised to call back or seek an in-person evaluation if the symptoms worsen or if the condition fails to improve as anticipated.  I provided 60 minutes of non-face-to-face time during this encounter.   Haya Hemler S, LCAS  .

## 2021-10-14 ENCOUNTER — Encounter (HOSPITAL_COMMUNITY): Payer: Self-pay | Admitting: Licensed Clinical Social Worker

## 2021-10-14 NOTE — Progress Notes (Signed)
Virtual Visit via Video Note  I connected with Mark [name] on 10/03/21 at 4:00pm EST by video-enabled telemedicine with the correct person using two identifiers.   I discussed the limitations of evaluation and management by telemedicine and the availability of in person appointments. The patient expressed understanding and agreed to proceed.  LOCATION: Patient: home Provider: home office    History of Present Illness: Pt was referred to [facility] OP therapy for PTSD, depression and anxiety by the [facility].  Treatment Goal Addressed:   Pt will reduce irritability and increase normal social interaction with family and friends and will develop the ability to recognize, accept, and cope with feelings of depression by using practiced coping skills for depression 1-2x daily AEB self report.  Progress towards Goal:  Progressing    Observations/Objective: Patient presented for today's session on time and was alert, oriented x5, with no evidence or self-report of SI/HI or A/V H.  Patient reported ongoing compliance with medication. Clinician inquired about patient's current emotional ratings, as well as any significant changes in thoughts, feelings or behavior since previous session. Patient reported emotional rating scores of  5/10 for depression, 5/10 for anxiety, 3/10 for anger/irritability. Cln and pt reviewed emotional ratings and coping skills. Pt provides updates on his stressors: family, grown children, work, wheelchair lift broken, isolation, pain, sleep. "Due to my lift still broken I am unable to go anywhere, I find myself becoming more depressed." Cln and pt reviewed coping skills for depression using behavior activation.  Clinician also utilized CBT to process concerns and challenges. Clinician processed thoughts, feelings, and behaviors. Clinician discussed coping skills and options for supporting himself through these challenging times.    Collaboration of Care: Other: Continue seeing  providers at [place] was advised Release of Information must be obtained prior to any record release in order to collaborate their care with an outside provider. Patient/Guardian was advised if they have not already done so to contact the registration department to sign all necessary forms in order for [name] to release information regarding their care.    Consent: Patient/Guardian gives verbal consent for treatment and assignment of benefits for services provided during this visit. Patient/Guardian expressed understanding and agreed to proceed.     Assessment and Plan: Counselor will continue to meet with patient to address treatment plan goals. Patient will continue to follow recommendations of providers and implement skills learned in session.   Follow Up Instructions I discussed the assessment and treatment plan with the patient. The patient was provided an opportunity to ask questions and all were answered. The patient agreed with the plan and demonstrated an understanding of the instructions.   The patient was advised to call back or seek an in-person evaluation if the symptoms worsen or if the condition fails to improve as anticipated.  I provided 45 minutes of non-face-to-face time during this encounter.   Jahmeir Geisen S, LCAS  .

## 2021-10-15 ENCOUNTER — Encounter (HOSPITAL_COMMUNITY): Payer: Self-pay | Admitting: Licensed Clinical Social Worker

## 2021-10-15 NOTE — Progress Notes (Signed)
Virtual Visit via Video Note  I connected with Mark [name] on 09/20/21 at 2:00pm EST by video-enabled telemedicine with the correct person using two identifiers.   I discussed the limitations of evaluation and management by telemedicine and the availability of in person appointments. The patient expressed understanding and agreed to proceed.  LOCATION: Patient: home Provider: home office    History of Present Illness: Pt was referred to [facility] OP therapy for PTSD, depression and anxiety by the [facility].  Treatment Goal Addressed:   Pt will reduce irritability and increase normal social interaction with family and friends and will develop the ability to recognize, accept, and cope with feelings of depression by using practiced coping skills for depression 1-2x daily AEB self report.  Progress towards Goal:  Progressing    Observations/Objective: Patient presented for today's session on time and was alert, oriented x5, with no evidence or self-report of SI/HI or A/V H.  Patient reported ongoing compliance with medication. Clinician inquired about patient's current emotional ratings, as well as any significant changes in thoughts, feelings or behavior since previous session. Patient reported emotional rating scores of  6/10 for depression, 4/10 for anxiety, 3/10 for anger/irritability. Cln and pt reviewed emotional ratings and coping skills. Pt reports on stressors: loneliness, depressive symptoms, inability to go out due to chair lift broken, isolation, continued issues with family. CLn used The CBT triangle to show patient that his negative thoughts create negative feelings and negative feelings lead pt to act negatively, sometimes finding himself stuck in a negative cycle in life. Cln and pt explored the role that his thoughts, feelings, and actions play to help him break out of that cycle. Discussed alternative coping skills instead of his "go to" avoidance.    Collaboration of Care:  Other: Continue seeing providers at [place] was advised Release of Information must be obtained prior to any record release in order to collaborate their care with an outside provider. Patient/Guardian was advised if they have not already done so to contact the registration department to sign all necessary forms in order for [name] to release information regarding their care.    Consent: Patient/Guardian gives verbal consent for treatment and assignment of benefits for services provided during this visit. Patient/Guardian expressed understanding and agreed to proceed.     Assessment and Plan: Counselor will continue to meet with patient to address treatment plan goals. Patient will continue to follow recommendations of providers and implement skills learned in session.   Follow Up Instructions I discussed the assessment and treatment plan with the patient. The patient was provided an opportunity to ask questions and all were answered. The patient agreed with the plan and demonstrated an understanding of the instructions.   The patient was advised to call back or seek an in-person evaluation if the symptoms worsen or if the condition fails to improve as anticipated.  I provided 45 minutes of non-face-to-face time during this encounter.   Arneshia Ade S, LCAS  .

## 2021-10-15 NOTE — Progress Notes (Signed)
Virtual Visit via Video Note  I connected with Mark [name] on 09/18/2021 at 5:00 pm EST by a video enabled telemedicine application and verified that I am speaking with the correct person using two identifiers.   I discussed the limitations of evaluation and management by telemedicine and the availability of in person appointments. The patient expressed understanding and agreed to proceed.  LOCATION: Patient: Home Provider: home Office  History of Present Illness: [facility]  Treatment Goal Addressed:  Pt will develop the ability to recognize, accept, and cope with feelings of depression AEB self report.  Progress towards Goal:  Progressing    Observation/Objective:  Pt participated in a discussion on the challenge in being productive and feeling positive when physical health is poor. Clinician provided supportive feedback to group member, who is not experiencing physical wellness.      Assessment and Plan: Counselor will continue to meet with patient address treatment plan goals. Patient was given a chance for recommendations of providers and implement skills learned in session.    Follow-up instructions:I discussed the assessment and treatment plan with the patient. Patient was provided an opportunity to ask questions and all were answered. The patient agreed with the plan and demonstrated an understanding of the instructions.   The patient was advised to call back or seek an in-person evaluation if the symptoms worsen or if the condition fails to improve as anticipated.  Collaboration of Care: Other No collaboration of care was needed at this session.  Patient/Guardian was advised Release of Information must be obtained prior to any record release in order to collaborate their care with an outside provider. Patient/Guardian was advised if they have not already done so to contact the registration department  to sign all necessary forms in order for [name] to release information regarding their care.   Consent: Patient/Guardian gives verbal consent for treatment and assignment of benefits for services provided during this visit. Patient/Guardian expressed understanding and agreed to proceed.      I provided 75 minutes of non-face-to-face time during this encounter.   Hildagard Sobecki S, LCAS

## 2021-10-16 ENCOUNTER — Encounter (HOSPITAL_COMMUNITY): Payer: Self-pay | Admitting: Licensed Clinical Social Worker

## 2021-10-16 ENCOUNTER — Ambulatory Visit (INDEPENDENT_AMBULATORY_CARE_PROVIDER_SITE_OTHER): Payer: Medicare HMO | Admitting: Licensed Clinical Social Worker

## 2021-10-16 DIAGNOSIS — F419 Anxiety disorder, unspecified: Secondary | ICD-10-CM | POA: Diagnosis not present

## 2021-10-16 DIAGNOSIS — F4310 Post-traumatic stress disorder, unspecified: Secondary | ICD-10-CM | POA: Diagnosis not present

## 2021-10-16 DIAGNOSIS — F331 Major depressive disorder, recurrent, moderate: Secondary | ICD-10-CM | POA: Diagnosis not present

## 2021-10-16 NOTE — Progress Notes (Signed)
Virtual Visit via Video Note  I connected with Mark [name] on 10/16/2021 at 5:00 pm EST by a video enabled telemedicine application and verified that I am speaking with the correct person using two identifiers.   I discussed the limitations of evaluation and management by telemedicine and the availability of in person appointments. The patient expressed understanding and agreed to proceed.  LOCATION: Patient: Home Provider: home Office  History of Present Illness: [facility], [facility], [facility].  Treatment Goal Addressed:  Pt will develop the ability to recognize, accept, and cope with feelings of depression AEB self report.  Progress towards Goal:  Progressing    Observation/Objective:  Patient participated in a group discussion on depression. Clinician provided education on the cycle of depression, depression medications available and an alternative:  [place].  Patient was encouraged to know triggers, symptoms and available coping skills.  Assessment and Plan: Counselor will continue to meet with patient address treatment plan goals. Patient was given a chance for recommendations of providers and implement skills learned in session.    Follow-up instructions:I discussed the assessment and treatment plan with the patient. Patient was provided an opportunity to ask questions and all were answered. The patient agreed with the plan and demonstrated an understanding of the instructions.   The patient was advised to call back or seek an in-person evaluation if the symptoms worsen or if the condition fails to improve as anticipated.  Collaboration of Care: Other No collaboration of care was needed at this session.  Patient/Guardian was advised Release of Information must be obtained prior to any record release in order to collaborate their care with an outside provider. Patient/Guardian was advised if they have not already done so to  contact the registration department to sign all necessary forms in order for [name] to release information regarding their care.   Consent: Patient/Guardian gives verbal consent for treatment and assignment of benefits for services provided during this visit. Patient/Guardian expressed understanding and agreed to proceed.      I provided 60 minutes of non-face-to-face time during this encounter.   Keante Urizar S, LCAS

## 2021-10-18 ENCOUNTER — Encounter (HOSPITAL_COMMUNITY): Payer: Self-pay | Admitting: Licensed Clinical Social Worker

## 2021-10-18 ENCOUNTER — Ambulatory Visit (INDEPENDENT_AMBULATORY_CARE_PROVIDER_SITE_OTHER): Payer: Medicare HMO | Admitting: Licensed Clinical Social Worker

## 2021-10-18 DIAGNOSIS — F4310 Post-traumatic stress disorder, unspecified: Secondary | ICD-10-CM

## 2021-10-18 DIAGNOSIS — F419 Anxiety disorder, unspecified: Secondary | ICD-10-CM | POA: Diagnosis not present

## 2021-10-18 DIAGNOSIS — F331 Major depressive disorder, recurrent, moderate: Secondary | ICD-10-CM

## 2021-10-18 NOTE — Progress Notes (Signed)
Virtual Visit via Video Note  I connected with Mark [name] on 10/18/21 at 2:00pm EST by video-enabled telemedicine with the correct person using two identifiers.   I discussed the limitations of evaluation and management by telemedicine and the availability of in person appointments. The patient expressed understanding and agreed to proceed.  LOCATION: Patient: home Provider: home office    History of Present Illness: Pt was referred to [facility] OP therapy for PTSD, depression and anxiety by the [facility].  Treatment Goal Addressed:   Pt will reduce irritability and increase normal social interaction with family and friends and will develop the ability to recognize, accept, and cope with feelings of depression by using practiced coping skills for depression 1-2x daily AEB self report.  Progress towards Goal:  Progressing    Observations/Objective: Patient presented for today's session on time and was alert, oriented x5, with no evidence or self-report of SI/HI or A/V H.  Patient reported ongoing compliance with medication. Clinician inquired about patient's current emotional ratings, as well as any significant changes in thoughts, feelings or behavior since previous session. Patient reported emotional rating scores of  5/10 for depression, 5/10 for anxiety, 4/10 for anger/irritability. Cln and pt reviewed emotional ratings and coping skills. Pt reports "Im still unable to go out because of my wheelchair lift has still not been fixed so I'm still stuck at home.This has been going on for a month." Cln and pt explored alternatives to having it repaired. Patient participated in a discussion on using mindfulness-based stress response to assist patient in lowering his stress response. Patient was encouraged to use mindfulness skills as a coping skill to assist in lowering stress response.Clinician utilized CBT to process concerns and challenges. Clinician processed thoughts, feelings, and behaviors.  Clinician discussed coping skills.    Collaboration of Care: Other: Continue seeing providers at [place] was advised Release of Information must be obtained prior to any record release in order to collaborate their care with an outside provider. Patient/Guardian was advised if they have not already done so to contact the registration department to sign all necessary forms in order for [name] to release information regarding their care.    Consent: Patient/Guardian gives verbal consent for treatment and assignment of benefits for services provided during this visit. Patient/Guardian expressed understanding and agreed to proceed.     Assessment and Plan: Counselor will continue to meet with patient to address treatment plan goals. Patient will continue to follow recommendations of providers and implement skills learned in session.   Follow Up Instructions I discussed the assessment and treatment plan with the patient. The patient was provided an opportunity to ask questions and all were answered. The patient agreed with the plan and demonstrated an understanding of the instructions.   The patient was advised to call back or seek an in-person evaluation if the symptoms worsen or if the condition fails to improve as anticipated.  I provided 60 minutes of non-face-to-face time during this encounter.   Marc Sivertsen S, LCAS  .

## 2021-10-23 ENCOUNTER — Ambulatory Visit (INDEPENDENT_AMBULATORY_CARE_PROVIDER_SITE_OTHER): Payer: Medicare HMO | Admitting: Licensed Clinical Social Worker

## 2021-10-23 ENCOUNTER — Encounter (HOSPITAL_COMMUNITY): Payer: Self-pay | Admitting: Licensed Clinical Social Worker

## 2021-10-23 DIAGNOSIS — F419 Anxiety disorder, unspecified: Secondary | ICD-10-CM

## 2021-10-23 DIAGNOSIS — F4310 Post-traumatic stress disorder, unspecified: Secondary | ICD-10-CM | POA: Diagnosis not present

## 2021-10-23 DIAGNOSIS — F331 Major depressive disorder, recurrent, moderate: Secondary | ICD-10-CM

## 2021-10-23 NOTE — Progress Notes (Signed)
Virtual Visit via Video Note  I connected with Mark [name] on 10/23/2021 at 5:00 pm EST by a video enabled telemedicine application and verified that I am speaking with the correct person using two identifiers.   I discussed the limitations of evaluation and management by telemedicine and the availability of in person appointments. The patient expressed understanding and agreed to proceed.  LOCATION: Patient: Home Provider: home Office  History of Present Illness: [facility], [facility].  Treatment Goal Addressed:  Pt will develop the ability to recognize, accept, and cope with feelings of depression AEB self report.  Progress towards Goal:  Progressing    Observation/Objective:  Patient participated in a discussion on stress. Clinician provided psychoeducation on mindfulness, improving emotional regulation which leads to a better mood and better ability to handle stress and helps to develop personal coping strategies that target problem solving. Patient was encouraged to use mindfulness skills.    Assessment and Plan: Counselor will continue to meet with patient address treatment plan goals. Patient was given a chance for recommendations of providers and implement skills learned in session.    Follow-up instructions:I discussed the assessment and treatment plan with the patient. Patient was provided an opportunity to ask questions and all were answered. The patient agreed with the plan and demonstrated an understanding of the instructions.   The patient was advised to call back or seek an in-person evaluation if the symptoms worsen or if the condition fails to improve as anticipated.  Collaboration of Care: Other: Continue to see providers at [place] was advised Release of Information must be obtained prior to any record release in order to collaborate their care with an outside provider. Patient/Guardian  was advised if they have not already done so to contact the registration department to sign all necessary forms in order for [name] to release information regarding their care.   Consent: Patient/Guardian gives verbal consent for treatment and assignment of benefits for services provided during this visit. Patient/Guardian expressed understanding and agreed to proceed.      I provided 60 minutes of non-face-to-face time during this encounter.   Chianti Goh S, LCAS

## 2021-10-25 ENCOUNTER — Ambulatory Visit (INDEPENDENT_AMBULATORY_CARE_PROVIDER_SITE_OTHER): Payer: No Typology Code available for payment source | Admitting: Licensed Clinical Social Worker

## 2021-10-25 DIAGNOSIS — F331 Major depressive disorder, recurrent, moderate: Secondary | ICD-10-CM | POA: Diagnosis not present

## 2021-10-25 DIAGNOSIS — F419 Anxiety disorder, unspecified: Secondary | ICD-10-CM | POA: Diagnosis not present

## 2021-10-25 DIAGNOSIS — F4310 Post-traumatic stress disorder, unspecified: Secondary | ICD-10-CM

## 2021-10-29 ENCOUNTER — Encounter (HOSPITAL_COMMUNITY): Payer: Self-pay | Admitting: Licensed Clinical Social Worker

## 2021-10-29 NOTE — Progress Notes (Signed)
Virtual Visit via Video Note  I connected with Mark [name] on 10/25/21 at 2:00pm EST by video-enabled telemedicine with the correct person using two identifiers.   I discussed the limitations of evaluation and management by telemedicine and the availability of in person appointments. The patient expressed understanding and agreed to proceed.  LOCATION: Patient: home Provider: home office    History of Present Illness: Pt was referred to [name] OP therapy for PTSD, depression and anxiety by the [facility].  Treatment Goal Addressed:   Pt will reduce irritability and increase normal social interaction with family and friends and will develop the ability to recognize, accept, and cope with feelings of depression by using practiced coping skills for depression 1-2x daily AEB self report.  Progress towards Goal:  Progressing    Observations/Objective: Patient presented for today'[name] session on time and was alert, oriented x5, with no evidence or self-report of SI/HI or A/V H.  Patient reported ongoing compliance with medication. Clinician inquired about patient'[name] current emotional ratings, as well as any significant changes in thoughts, feelings or behavior since previous session. Patient reported emotional rating scores of  5/10 for depression, 5/10 for anxiety, 4/10 for anger/irritability. Cln and pt reviewed emotional ratings and coping skills. Pt reports "I had my lift fixed so I can finally go out. Patient participated in a discussion on using mindfulness-based stress response to assist patient in lowering his stress response, LFT. Patient was encouraged to use mindfulness skills as a coping skill to assist in lowering his LFT.In session pt practiced using mindfulness skills for his LFT. Clinician utilized CBT to process concerns and challenges. Clinician processed thoughts, feelings, and behaviors.   Collaboration of Care: Other: Continue seeing providers at [place] was advised  Release of Information must be obtained prior to any record release in order to collaborate their care with an outside provider. Patient/Guardian was advised if they have not already done so to contact the registration department to sign all necessary forms in order for [name] to release information regarding their care.    Consent: Patient/Guardian gives verbal consent for treatment and assignment of benefits for services provided during this visit. Patient/Guardian expressed understanding and agreed to proceed.     Plan: CLn will be going  out on FMLA for 4-6 weeks. CLn and pt explored having an interim therapist and pt decided to wait until cln returns.Cln gave pt information for crisis if needed, [facility], [facility], [facility], [place].   Follow Up Instructions I discussed the assessment and treatment plan with the patient. The patient was provided an opportunity to ask questions and all were answered. The patient agreed with the plan and demonstrated an understanding of the instructions.   The patient was advised to call back or seek an in-person evaluation if the symptoms worsen or if the condition fails to improve as anticipated.  I provided 60 minutes of non-face-to-face time during this encounter.   Mark [name], LCAS  .

## 2021-10-30 ENCOUNTER — Ambulatory Visit (HOSPITAL_COMMUNITY): Payer: Medicare HMO | Admitting: Licensed Clinical Social Worker

## 2021-11-06 ENCOUNTER — Ambulatory Visit (HOSPITAL_COMMUNITY): Payer: Medicare HMO | Admitting: Licensed Clinical Social Worker

## 2021-11-13 ENCOUNTER — Ambulatory Visit (HOSPITAL_COMMUNITY): Payer: Medicare HMO | Admitting: Licensed Clinical Social Worker

## 2021-11-17 IMAGING — CT CT ABD-PELV W/ CM
2 of 5 series · 13 of 46 positions shown, 15 images · IV contrast (omnipaque)
Comparison: CT lumbar spine 12/06/2019

CLINICAL DATA: Intrabdominal abscess evaluate size and extension of
gluteal abscess

EXAM:
CT ABDOMEN AND PELVIS WITH CONTRAST
TECHNIQUE: Multidetector CT imaging of the abdomen and pelvis was performed
using the standard protocol following bolus administration of
intravenous contrast.
CONTRAST:  80mL OMNIPAQUE IOHEXOL 350 MG/ML SOLN

[Series 2: axial st · axial · 0.95mm/px · z∈[+1131,+1551]mm · 10 of 104 slices shown, 12 images]
[im 10/104  soft-tissue]
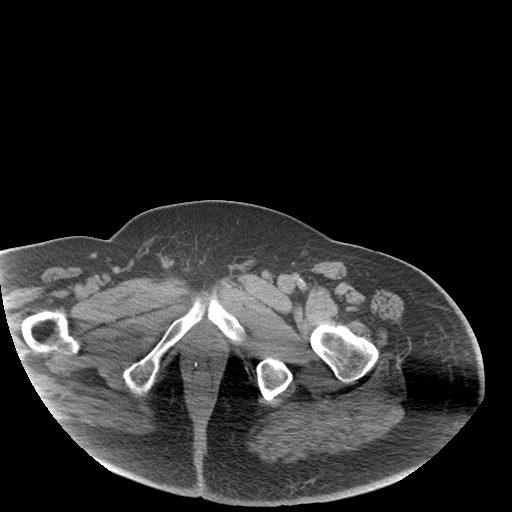
[im 10/104  bone]
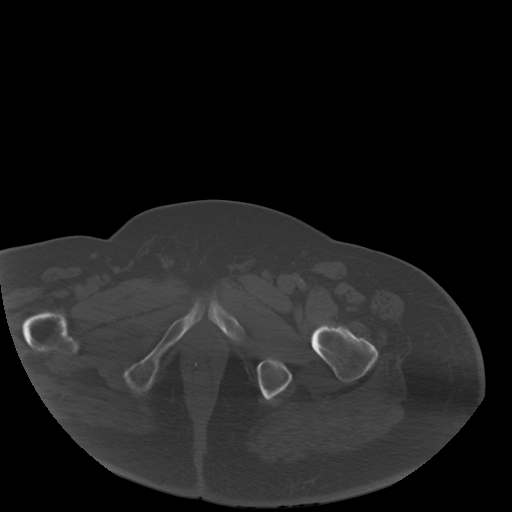
[im 19/104  soft-tissue]
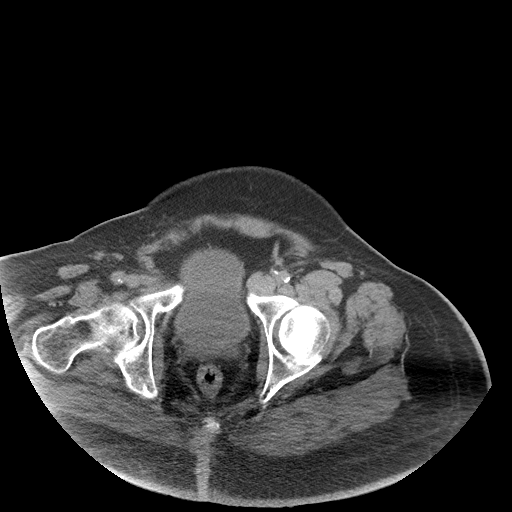
[im 29/104  soft-tissue]
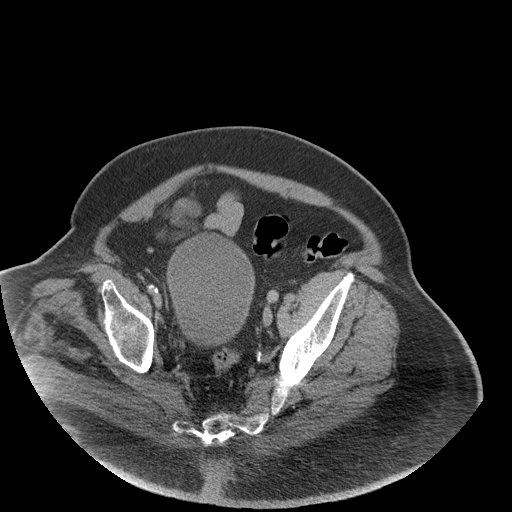
[im 38/104  soft-tissue]
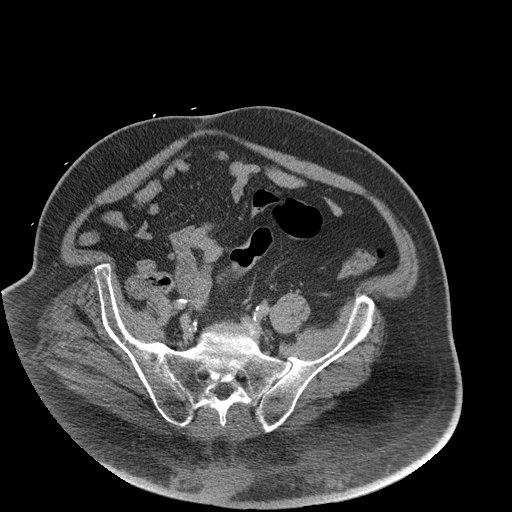
[im 47/104  soft-tissue]
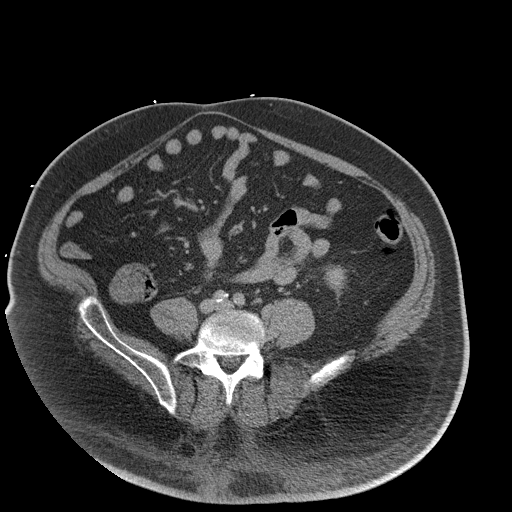
[im 57/104  soft-tissue]
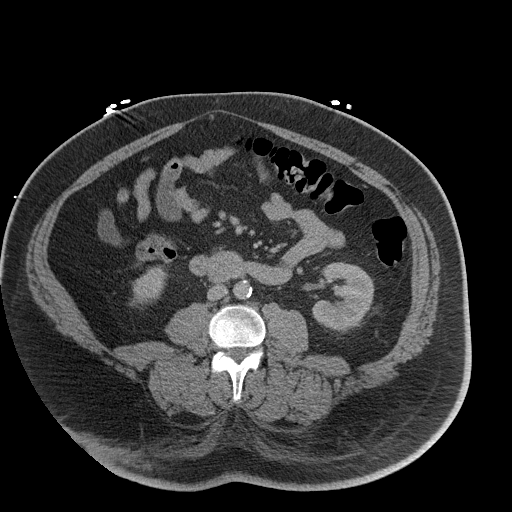
[im 66/104  soft-tissue]
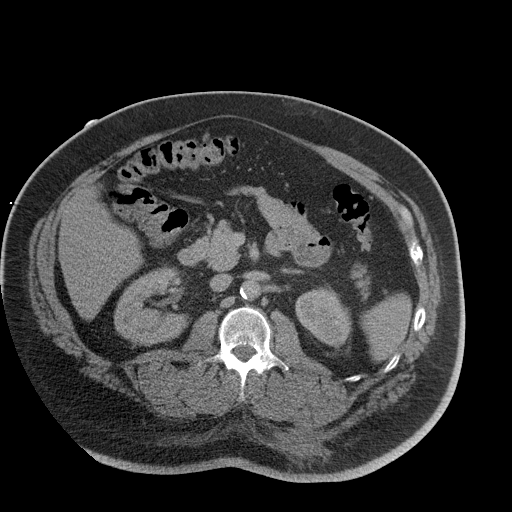
[im 75/104  soft-tissue]
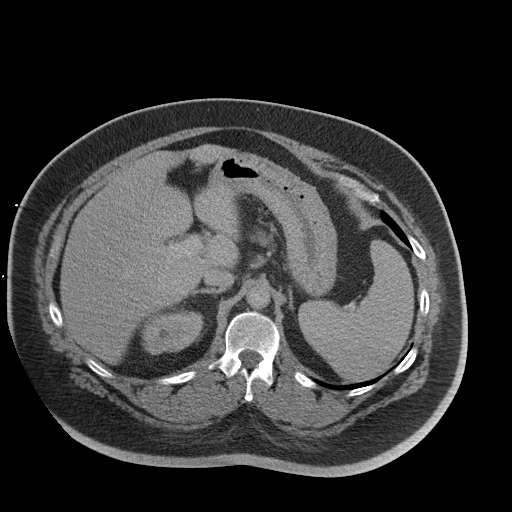
[im 85/104  soft-tissue]
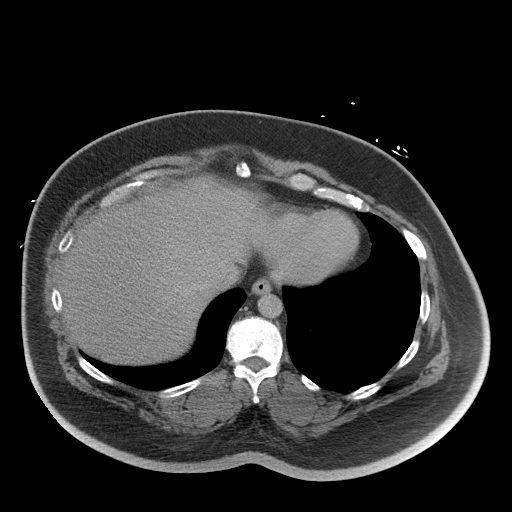
[im 85/104  bone]
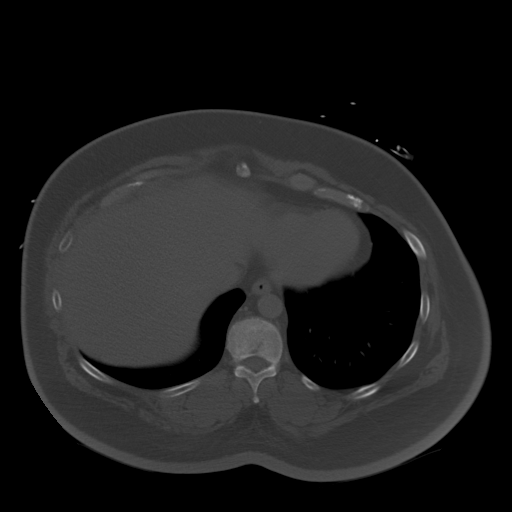
[im 94/104  soft-tissue]
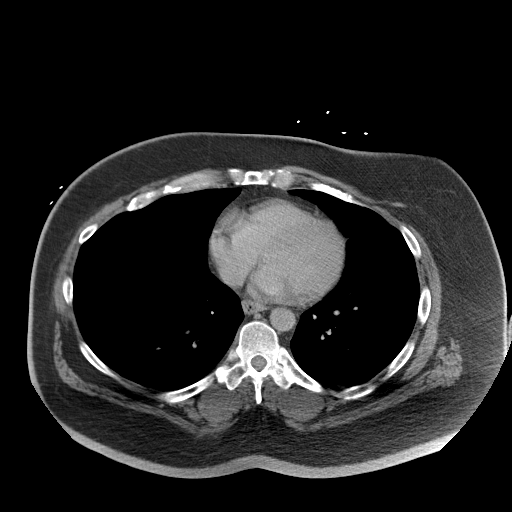

[Series 4: coronal st · coronal · 1.06mm/px · 3 of 213 slices shown]
[im 71/213  soft-tissue]
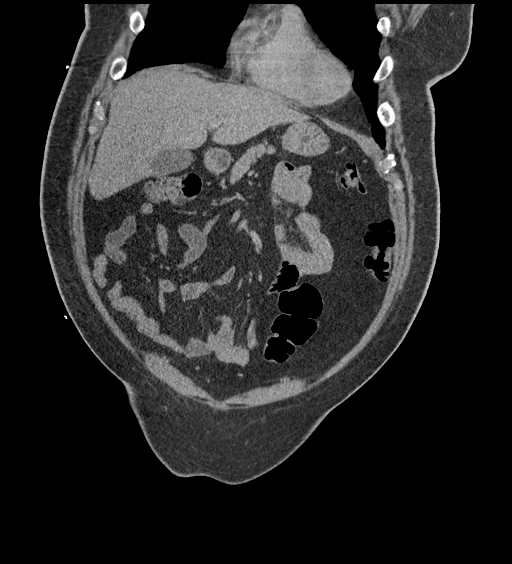
[im 95/213  soft-tissue]
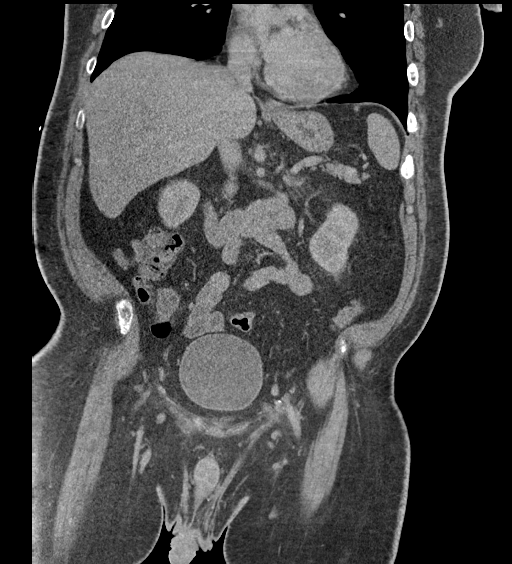
[im 118/213  soft-tissue]
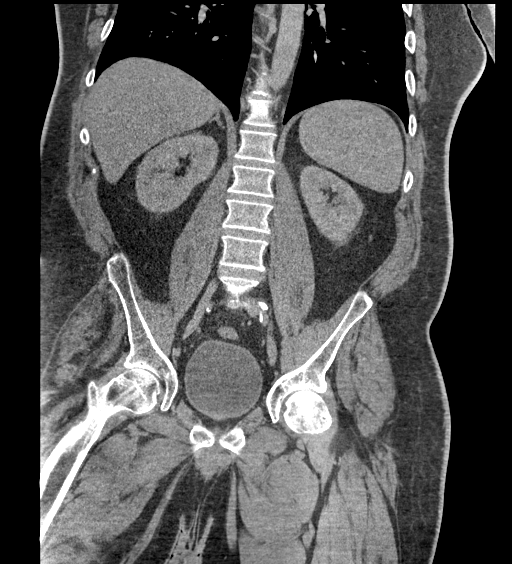

[13 of 46 positions shown; findings below may reference images not displayed]

FINDINGS: Lower chest: 3 mm site of ground-glass nodularity in the subpleural
aspect of the right lower lobe, (series 6, image 3). The included
lung bases are otherwise clear. Heart size is normal.

Hepatobiliary: Mildly decreased attenuation of the hepatic
parenchyma suggesting hepatic steatosis. No focal liver lesion
identified. Tiny stone within the gallbladder. No evidence of
gallbladder wall thickening or pericholecystic inflammatory changes
by CT. No biliary dilatation.

Pancreas: Unremarkable. No pancreatic ductal dilatation or
surrounding inflammatory changes.

Spleen: Normal in size without focal abnormality.

Adrenals/Urinary Tract: Unremarkable adrenal glands. Kidneys enhance
symmetrically without focal lesion, stone, or hydronephrosis.
Ureters are nondilated. Urinary bladder appears unremarkable.

Stomach/Bowel: Stomach is within normal limits. Appendix appears
normal. No evidence of bowel wall thickening, distention, or
inflammatory changes.

Vascular/Lymphatic: Scattered aortoiliac atherosclerotic
calcifications without aneurysm. Enlarged bilateral inguinal and
external iliac chain lymph nodes with the largest node in the left
external iliac chain measuring 2.0 cm short axis (series 2, image
86). No abdominal lymphadenopathy.

Reproductive: Prostate is unremarkable.

Other: No free fluid. No abdominopelvic fluid collection. No
pneumoperitoneum. Tiny fat containing paraumbilical hernia.

Musculoskeletal: There is prominent skin thickening of the posterior
back and gluteal region. Small probable thick-walled collection in
the region of the inferior gluteal cleft measuring up to 1.9 cm
(series 2, image 84). This area closely approximates the
sacrococcygeal junction. No definite bony destruction is identified
to suggest osteomyelitis.

A more ill-defined area of increased density in the soft tissues
along the superior aspect of the gluteal cleft appears similar in
appearance to the prior CT from 7879. Additionally there are sites
of increased density and stranding within the subcutaneous soft
tissues of the upper left gluteal region (series 2, image 63), left
flank (series 2, image 42) and lower right back (series 2, image
50), all of which were present on the prior CT lumbar spine from
12/06/2019. No soft tissue gas.

Subtly sclerotic appearance of the T11 vertebral body without
fracture or well-defined bone lesion. Findings could represent
sequela of prior trauma, or possibly a bone lesion such as a
intraosseous hemangioma.
IMPRESSION: 1. Small probable thick-walled collection in the region of the
inferior gluteal cleft measuring up to 1.9 cm may reflect a small
phlegmon or abscess. This area closely approximates the
sacrococcygeal junction. No definite bony destruction is identified
to suggest osteomyelitis.
2. Enlarged bilateral inguinal and external iliac chain lymph nodes
with the largest node in the left external iliac chain measuring up
to 2.0 cm short axis. Findings are likely reactive.
3. Multiple sites of increased density and stranding within the
subcutaneous soft tissues of the upper left gluteal region, left
flank, and lower right back. These findings were all present on the
prior CT from Saturday November, 2019 and may reflect sequela of prior
trauma or infection.
4. Cholelithiasis without evidence of acute cholecystitis.
5. Subtly sclerotic appearance of the T11 vertebral body without
fracture or well-defined bone lesion. This is unchanged from the
prior CT and felt to most likely represent sequela of prior trauma,
or possibly a bone lesion such as a intraosseous hemangioma.
6. 3 mm site of ground-glass nodularity in the subpleural aspect of
the right lower lobe, likely infectious or inflammatory in etiology.
7. Hepatic steatosis.

Aortic Atherosclerosis ([id number]).

## 2021-11-20 ENCOUNTER — Ambulatory Visit (HOSPITAL_COMMUNITY): Payer: Medicare HMO | Admitting: Licensed Clinical Social Worker

## 2022-01-08 ENCOUNTER — Ambulatory Visit (INDEPENDENT_AMBULATORY_CARE_PROVIDER_SITE_OTHER): Payer: No Typology Code available for payment source | Admitting: Licensed Clinical Social Worker

## 2022-01-08 DIAGNOSIS — F411 Generalized anxiety disorder: Secondary | ICD-10-CM | POA: Diagnosis not present

## 2022-01-08 DIAGNOSIS — F4310 Post-traumatic stress disorder, unspecified: Secondary | ICD-10-CM | POA: Diagnosis not present

## 2022-01-08 DIAGNOSIS — F331 Major depressive disorder, recurrent, moderate: Secondary | ICD-10-CM

## 2022-01-10 ENCOUNTER — Ambulatory Visit (INDEPENDENT_AMBULATORY_CARE_PROVIDER_SITE_OTHER): Payer: No Typology Code available for payment source | Admitting: Licensed Clinical Social Worker

## 2022-01-10 DIAGNOSIS — F411 Generalized anxiety disorder: Secondary | ICD-10-CM | POA: Diagnosis not present

## 2022-01-10 DIAGNOSIS — F4310 Post-traumatic stress disorder, unspecified: Secondary | ICD-10-CM | POA: Diagnosis not present

## 2022-01-10 DIAGNOSIS — F331 Major depressive disorder, recurrent, moderate: Secondary | ICD-10-CM | POA: Diagnosis not present

## 2022-01-16 ENCOUNTER — Ambulatory Visit (INDEPENDENT_AMBULATORY_CARE_PROVIDER_SITE_OTHER): Payer: No Typology Code available for payment source | Admitting: Licensed Clinical Social Worker

## 2022-01-16 DIAGNOSIS — F4310 Post-traumatic stress disorder, unspecified: Secondary | ICD-10-CM

## 2022-01-16 DIAGNOSIS — F411 Generalized anxiety disorder: Secondary | ICD-10-CM | POA: Diagnosis not present

## 2022-01-16 DIAGNOSIS — F331 Major depressive disorder, recurrent, moderate: Secondary | ICD-10-CM | POA: Diagnosis not present

## 2022-01-23 ENCOUNTER — Ambulatory Visit (INDEPENDENT_AMBULATORY_CARE_PROVIDER_SITE_OTHER): Payer: No Typology Code available for payment source | Admitting: Licensed Clinical Social Worker

## 2022-01-23 DIAGNOSIS — F4310 Post-traumatic stress disorder, unspecified: Secondary | ICD-10-CM | POA: Diagnosis not present

## 2022-01-23 DIAGNOSIS — F411 Generalized anxiety disorder: Secondary | ICD-10-CM | POA: Diagnosis not present

## 2022-01-23 DIAGNOSIS — F331 Major depressive disorder, recurrent, moderate: Secondary | ICD-10-CM | POA: Diagnosis not present

## 2022-01-24 ENCOUNTER — Encounter (HOSPITAL_COMMUNITY): Payer: Self-pay | Admitting: Licensed Clinical Social Worker

## 2022-01-24 NOTE — Progress Notes (Signed)
Virtual Visit via Video Note  I connected with Mark [name] on 01/08/22 at 2:00pm EST by video-enabled telemedicine with the correct person using two identifiers.   I discussed the limitations of evaluation and management by telemedicine and the availability of in person appointments. The patient expressed understanding and agreed to proceed.  LOCATION: Patient: home Provider: home office    History of Present Illness: Pt was referred to [facility] OP therapy for PTSD, depression and anxiety by the [facility].  Treatment Goal Addressed:   Pt will reduce irritability and increase normal social interaction with family and friends and will develop the ability to recognize, accept, and cope with feelings of depression by using practiced coping skills for depression 1-2x daily AEB self report.  Progress towards Goal:  Progressing    Observations/Objective: Today is the first day back for therapist who has been on FMLA. Pt provided an update on mental health and physical health during group, while therapist was out. Pt chose not to have an interim therapist but was given resources to use in case of crisis.         Collaboration of Care: Other: Continue seeing providers at [place] was advised Release of Information must be obtained prior to any record release in order to collaborate their care with an outside provider. Patient/Guardian was advised if they have not already done so to contact the registration department to sign all necessary forms in order for [name] to release information regarding their care.    Consent: Patient/Guardian gives verbal consent for treatment and assignment of benefits for services provided during this visit. Patient/Guardian expressed understanding and agreed to proceed.     Assessment and Plan: Counselor will continue to meet with patient address treatment plan goals. Patient was given a chance for recommendations of providers and implement skills  learned in session.   Follow Up Instructions I discussed the assessment and treatment plan with the patient. The patient was provided an opportunity to ask questions and all were answered. The patient agreed with the plan and demonstrated an understanding of the instructions.   The patient was advised to call back or seek an in-person evaluation if the symptoms worsen or if the condition fails to improve as anticipated.  I provided 60 minutes of non-face-to-face time during this encounter.   Chery Giusto S, LCAS  .

## 2022-01-29 ENCOUNTER — Encounter (HOSPITAL_COMMUNITY): Payer: Self-pay | Admitting: Licensed Clinical Social Worker

## 2022-01-29 ENCOUNTER — Ambulatory Visit (INDEPENDENT_AMBULATORY_CARE_PROVIDER_SITE_OTHER): Payer: No Typology Code available for payment source | Admitting: Licensed Clinical Social Worker

## 2022-01-29 DIAGNOSIS — F411 Generalized anxiety disorder: Secondary | ICD-10-CM

## 2022-01-29 DIAGNOSIS — F331 Major depressive disorder, recurrent, moderate: Secondary | ICD-10-CM

## 2022-01-29 DIAGNOSIS — F4310 Post-traumatic stress disorder, unspecified: Secondary | ICD-10-CM

## 2022-01-29 NOTE — Progress Notes (Signed)
Virtual Visit via Video Note  I connected with Mark [name] on 01/08/22 at 2:00pm EST by video-enabled telemedicine with the correct person using two identifiers.   I discussed the limitations of evaluation and management by telemedicine and the availability of in person appointments. The patient expressed understanding and agreed to proceed.  LOCATION: Patient: home Provider: home office    History of Present Illness: Pt was referred to [facility] OP therapy for PTSD, depression and anxiety by the [facility].  Treatment Goal Addressed:   Pt will reduce irritability and increase normal social interaction with family and friends and will develop the ability to recognize, accept, and cope with feelings of depression by using practiced coping skills for depression 1-2x daily AEB self report.  Progress towards Goal:  Progressing    Observations/Objective: Today is the first day back for therapist who has been on FMLA. Pt provided an update on mental health and physical health during group, while therapist was out. Pt chose not to have an interim therapist but was given resources to use in case of crisis.         Collaboration of Care: Other: [facility]   Patient/Guardian was advised Release of Information must be obtained prior to any record release in order to collaborate their care with an outside provider. Patient/Guardian was advised if they have not already done so to contact the registration department to sign all necessary forms in order for us to release information regarding their care.    Consent: Patient/Guardian gives verbal consent for treatment and assignment of benefits for services provided during this visit. Patient/Guardian expressed understanding and agreed to proceed.     Assessment and Plan: Counselor will continue to meet with patient address treatment plan goals. Patient was given a chance for recommendations of providers and implement skills  learned in session.   Follow Up Instructions I discussed the assessment and treatment plan with the patient. The patient was provided an opportunity to ask questions and all were answered. The patient agreed with the plan and demonstrated an understanding of the instructions.   The patient was advised to call back or seek an in-person evaluation if the symptoms worsen or if the condition fails to improve as anticipated.  I provided 60 minutes of non-face-to-face time during this encounter.   Haile Toppins S, LCAS  . 

## 2022-01-30 ENCOUNTER — Ambulatory Visit (INDEPENDENT_AMBULATORY_CARE_PROVIDER_SITE_OTHER): Payer: No Typology Code available for payment source | Admitting: Licensed Clinical Social Worker

## 2022-01-30 DIAGNOSIS — F331 Major depressive disorder, recurrent, moderate: Secondary | ICD-10-CM

## 2022-01-30 DIAGNOSIS — F411 Generalized anxiety disorder: Secondary | ICD-10-CM

## 2022-01-30 DIAGNOSIS — F4310 Post-traumatic stress disorder, unspecified: Secondary | ICD-10-CM | POA: Diagnosis not present

## 2022-02-05 ENCOUNTER — Encounter (HOSPITAL_COMMUNITY): Payer: Self-pay | Admitting: Licensed Clinical Social Worker

## 2022-02-05 ENCOUNTER — Ambulatory Visit (INDEPENDENT_AMBULATORY_CARE_PROVIDER_SITE_OTHER): Payer: No Typology Code available for payment source | Admitting: Licensed Clinical Social Worker

## 2022-02-05 DIAGNOSIS — F411 Generalized anxiety disorder: Secondary | ICD-10-CM

## 2022-02-05 DIAGNOSIS — F331 Major depressive disorder, recurrent, moderate: Secondary | ICD-10-CM

## 2022-02-05 DIAGNOSIS — F4310 Post-traumatic stress disorder, unspecified: Secondary | ICD-10-CM

## 2022-02-05 NOTE — Progress Notes (Signed)
Virtual Visit via Video Note  I connected with Mark [name] on 01/16/22 at 2:00pm EST by video-enabled telemedicine with the correct person using two identifiers.   I discussed the limitations of evaluation and management by telemedicine and the availability of in person appointments. The patient expressed understanding and agreed to proceed.  LOCATION: Patient: home Provider: home office    History of Present Illness: Pt was referred to [facility] OP therapy for PTSD, depression and anxiety by the [facility].  Treatment Goal Addressed:   Pt will reduce irritability and increase normal social interaction with family and friends and will develop the ability to recognize, accept, and cope with feelings of depression by using practiced coping skills for depression 1-2x daily AEB self report.  Progress towards Goal:  Progressing    Observations/Objective: Patient presented for today's session on time and was alert, oriented x5, with no evidence or self-report of SI/HI or A/V H.  Patient reported ongoing compliance with medication and denied any use of alcohol or illicit substances.  Clinician inquired about patient's current emotional ratings, as well as any significant changes in thoughts, feelings or behavior since previous session.  Patient reported scores of 2/10 for depression, 3/10 for anxiety, 0/10 for anger/irritability, and denied any reoccurrence of panic attacks.Pt reports of his increase in irritability due to be home alone for the holidays. "I hoped my family would come down her but they didn't. I reviewed by depressive handouts and skills reviewed in previous sessions. " Pt asked open ended questions. Pt and cln reviewed the circle of depression and Behavior Activiation.       Collaboration of Care: Other: Continue seeing providers at [place] was advised Release of Information must be obtained prior to any record release in order to collaborate their care with an  outside provider. Patient/Guardian was advised if they have not already done so to contact the registration department to sign all necessary forms in order for [name] to release information regarding their care.    Consent: Patient/Guardian gives verbal consent for treatment and assignment of benefits for services provided during this visit. Patient/Guardian expressed understanding and agreed to proceed.     Assessment and Plan: Counselor will continue to meet with patient address treatment plan goals. Patient was given a chance for recommendations of providers and implement skills learned in session.   Follow Up Instructions I discussed the assessment and treatment plan with the patient. The patient was provided an opportunity to ask questions and all were answered. The patient agreed with the plan and demonstrated an understanding of the instructions.   The patient was advised to call back or seek an in-person evaluation if the symptoms worsen or if the condition fails to improve as anticipated.  I provided 45 minutes of non-face-to-face time during this encounter.   Rohen Kimes S, LCAS  .

## 2022-02-07 ENCOUNTER — Ambulatory Visit (INDEPENDENT_AMBULATORY_CARE_PROVIDER_SITE_OTHER): Payer: No Typology Code available for payment source | Admitting: Licensed Clinical Social Worker

## 2022-02-07 DIAGNOSIS — F4310 Post-traumatic stress disorder, unspecified: Secondary | ICD-10-CM

## 2022-02-07 DIAGNOSIS — F331 Major depressive disorder, recurrent, moderate: Secondary | ICD-10-CM

## 2022-02-07 DIAGNOSIS — F411 Generalized anxiety disorder: Secondary | ICD-10-CM | POA: Diagnosis not present

## 2022-02-12 ENCOUNTER — Ambulatory Visit (INDEPENDENT_AMBULATORY_CARE_PROVIDER_SITE_OTHER): Payer: No Typology Code available for payment source | Admitting: Licensed Clinical Social Worker

## 2022-02-12 DIAGNOSIS — F411 Generalized anxiety disorder: Secondary | ICD-10-CM

## 2022-02-12 DIAGNOSIS — F331 Major depressive disorder, recurrent, moderate: Secondary | ICD-10-CM | POA: Diagnosis not present

## 2022-02-12 DIAGNOSIS — F4310 Post-traumatic stress disorder, unspecified: Secondary | ICD-10-CM | POA: Diagnosis not present

## 2022-02-14 ENCOUNTER — Ambulatory Visit (INDEPENDENT_AMBULATORY_CARE_PROVIDER_SITE_OTHER): Payer: No Typology Code available for payment source | Admitting: Licensed Clinical Social Worker

## 2022-02-14 DIAGNOSIS — F4310 Post-traumatic stress disorder, unspecified: Secondary | ICD-10-CM | POA: Diagnosis not present

## 2022-02-14 DIAGNOSIS — F331 Major depressive disorder, recurrent, moderate: Secondary | ICD-10-CM

## 2022-02-14 DIAGNOSIS — F411 Generalized anxiety disorder: Secondary | ICD-10-CM

## 2022-02-17 ENCOUNTER — Encounter (HOSPITAL_COMMUNITY): Payer: Self-pay | Admitting: Licensed Clinical Social Worker

## 2022-02-17 NOTE — Progress Notes (Signed)
Virtual Visit via Video Note  I connected with Mark [name] on 01/23/22 at 12:00pm EST by video-enabled telemedicine with the correct person using two identifiers.   I discussed the limitations of evaluation and management by telemedicine and the availability of in person appointments. The patient expressed understanding and agreed to proceed.  LOCATION: Patient: home Provider: home office    History of Present Illness: Pt was referred to [facility] OP therapy for PTSD, depression and anxiety by the [facility].  Treatment Goal Addressed:   Pt will reduce irritability and increase normal social interaction with family and friends and will develop the ability to recognize, accept, and cope with feelings of depression by using practiced coping skills for depression 1-2x daily AEB self report.  Progress towards Goal:  Progressing    Observations/Objective: Patient presented for today's session on time and was alert, oriented x5, with no evidence or self-report of SI/HI or A/V H.  Patient reported ongoing compliance with medication and denied any use of alcohol or illicit substances.  Clinician inquired about patient's current emotional ratings, as well as any significant changes in thoughts, feelings or behavior since previous session.  Patient reported scores of 2/10 for depression, 3/10 for anxiety, 0/10 for anger/irritability, and denied any reoccurrence of panic attacks. "Im still feeling angry, irritable, lonely, due to my family not coming for the holidays. Cln asked open ended questions. Pt and cln reviewed the circle of depression and Behavior Activiation. Cln recommended: self care behaviors, positive social engagements, focusing on overall work/home/life balance, and focusing on positive physical and emotional wellness       Collaboration of Care: Other: Continue seeing providers at [place] was advised Release of Information must be obtained prior to any record release in  order to collaborate their care with an outside provider. Patient/Guardian was advised if they have not already done so to contact the registration department to sign all necessary forms in order for [name] to release information regarding their care.    Consent: Patient/Guardian gives verbal consent for treatment and assignment of benefits for services provided during this visit. Patient/Guardian expressed understanding and agreed to proceed.     Assessment and Plan: Counselor will continue to meet with patient address treatment plan goals. Patient was given a chance for recommendations of providers and implement skills learned in session.   Follow Up Instructions I discussed the assessment and treatment plan with the patient. The patient was provided an opportunity to ask questions and all were answered. The patient agreed with the plan and demonstrated an understanding of the instructions.   The patient was advised to call back or seek an in-person evaluation if the symptoms worsen or if the condition fails to improve as anticipated.  I provided 25 minutes of non-face-to-face time during this encounter.   Sahej Schrieber S, LCAS  .

## 2022-02-19 ENCOUNTER — Encounter (HOSPITAL_COMMUNITY): Payer: Self-pay | Admitting: Licensed Clinical Social Worker

## 2022-02-19 ENCOUNTER — Ambulatory Visit (INDEPENDENT_AMBULATORY_CARE_PROVIDER_SITE_OTHER): Payer: No Typology Code available for payment source | Admitting: Licensed Clinical Social Worker

## 2022-02-19 DIAGNOSIS — F331 Major depressive disorder, recurrent, moderate: Secondary | ICD-10-CM | POA: Diagnosis not present

## 2022-02-19 DIAGNOSIS — F4310 Post-traumatic stress disorder, unspecified: Secondary | ICD-10-CM

## 2022-02-19 DIAGNOSIS — F411 Generalized anxiety disorder: Secondary | ICD-10-CM

## 2022-02-19 NOTE — Progress Notes (Signed)
Virtual Visit via Video Note  I connected with Mark [name] on 01/29/2022 at 5:00 pm EST by a video enabled telemedicine application and verified that I am speaking with the correct person using two identifiers.   I discussed the limitations of evaluation and management by telemedicine and the availability of in person appointments. The patient expressed understanding and agreed to proceed.  LOCATION: Patient: Home Provider: home Office  History of Present Illness: [facility].  Treatment Goal Addressed:  Pt will develop the ability to recognize, accept, and cope with feelings of depression AEB self report.  Progress towards Goal:  Progressing    Observation/Objective:  Patient participated in a group discussion on the holidays with family. Patient described his holidays and coping skills used to deal with feelings surrounding the holidays. Clinician congratulated patient on coping skills used and tolerating the holidays with family.       Assessment and Plan: Counselor will continue to meet with patient address treatment plan goals. Patient was given a chance for recommendations of providers and implement skills learned in session.    Follow-up instructions:I discussed the assessment and treatment plan with the patient. Patient was provided an opportunity to ask questions and all were answered. The patient agreed with the plan and demonstrated an understanding of the instructions.   The patient was advised to call back or seek an in-person evaluation if the symptoms worsen or if the condition fails to improve as anticipated.  Collaboration of Care: Other: Continue to see providers at [place] was advised Release of Information must be obtained prior to any record release in order to collaborate their care with an outside provider. Patient/Guardian was advised if they have not already done so to  contact the registration department to sign all necessary forms in order for [name] to release information regarding their care.   Consent: Patient/Guardian gives verbal consent for treatment and assignment of benefits for services provided during this visit. Patient/Guardian expressed understanding and agreed to proceed.      I provided 60 minutes of non-face-to-face time during this encounter.   Carmelo Reidel S, LCAS

## 2022-02-21 ENCOUNTER — Ambulatory Visit (HOSPITAL_COMMUNITY): Payer: No Typology Code available for payment source | Admitting: Licensed Clinical Social Worker

## 2022-02-23 ENCOUNTER — Ambulatory Visit (HOSPITAL_COMMUNITY): Payer: No Typology Code available for payment source | Admitting: Licensed Clinical Social Worker

## 2022-02-24 ENCOUNTER — Encounter (HOSPITAL_COMMUNITY): Payer: Self-pay | Admitting: Licensed Clinical Social Worker

## 2022-02-24 NOTE — Progress Notes (Signed)
Virtual Visit via Video Note  I connected with Mark [name] on 02/05/2022 at 5:00 pm EST by a video enabled telemedicine application and verified that I am speaking with the correct person using two identifiers.   I discussed the limitations of evaluation and management by telemedicine and the availability of in person appointments. The patient expressed understanding and agreed to proceed.  LOCATION: Patient: Home Provider: home Office  History of Present Illness: [facility], [facility], [facility].  Treatment Goal Addressed:  Pt will develop the ability to recognize, accept, and cope with feelings of depression AEB self report.  Progress towards Goal:  Progressing    Observation/Objective:   Patient participated in a group on making SMART goals for the new year of 2024. Clinician provided education on goal setting where SMART goals help focus efforts and increases the chances of achieving goals.  Patient was encouraged to identify personal goals for the new year of 2024.     Assessment and Plan: Counselor will continue to meet with patient address treatment plan goals. Patient was given a chance for recommendations of providers and implement skills learned in session.    Follow-up instructions:I discussed the assessment and treatment plan with the patient. Patient was provided an opportunity to ask questions and all were answered. The patient agreed with the plan and demonstrated an understanding of the instructions.   The patient was advised to call back or seek an in-person evaluation if the symptoms worsen or if the condition fails to improve as anticipated.  Collaboration of Care: Other: Continue to see providers at [place] was advised Release of Information must be obtained prior to any record release in order to collaborate their care with an outside provider. Patient/Guardian was advised if they have not already  done so to contact the registration department to sign all necessary forms in order for [name] to release information regarding their care.   Consent: Patient/Guardian gives verbal consent for treatment and assignment of benefits for services provided during this visit. Patient/Guardian expressed understanding and agreed to proceed.      I provided 60 minutes of non-face-to-face time during this encounter.   Abdinasir Spadafore S, LCAS

## 2022-02-26 ENCOUNTER — Ambulatory Visit (HOSPITAL_COMMUNITY): Payer: No Typology Code available for payment source | Admitting: Licensed Clinical Social Worker

## 2022-02-27 ENCOUNTER — Encounter (HOSPITAL_COMMUNITY): Payer: Self-pay | Admitting: Licensed Clinical Social Worker

## 2022-02-27 NOTE — Progress Notes (Signed)
Virtual Visit via Video Note  I connected with Mark [name] on 01/30/22 at 12:00pm EST by video-enabled telemedicine with the correct person using two identifiers.   I discussed the limitations of evaluation and management by telemedicine and the availability of in person appointments. The patient expressed understanding and agreed to proceed.  LOCATION: Patient: home Provider: home office    History of Present Illness: Pt was referred to [facility] OP therapy for PTSD, depression and anxiety by the [facility].  Treatment Goal Addressed:   Pt will reduce irritability and increase normal social interaction with family and friends and will develop the ability to recognize, accept, and cope with feelings of depression by using practiced coping skills for depression 1-2x daily AEB self report.  Progress towards Goal:  Progressing    Observations/Objective: Pt presented for today's session on time and was alert, oriented x5, with no evidence or self-report of SI/HI or A/V H.  Patient reported ongoing compliance with medication and denied any use of alcohol or illicit substances.  Clinician inquired about patient's current emotional ratings, as well as any significant changes in thoughts, feelings or behavior since previous session.  Patient reported scores of 2/10 for depression, 3/10 for anxiety, 0/10 for anger/irritability, and denied any reoccurrence of panic attacks. "My anxiety is my worse stressor now and I'm using the worksheets you've given me for coping skills." Cln and pt discussed emotional regulation skills, helping him learn to manage his feelings and to better cope with situations.  Patient was encouraged to apply emotional regulation skills and focus on the positive.        Collaboration of Care: Other: Continue seeing providers at [place] was advised Release of Information must be obtained prior to any record release in order to collaborate their care with an  outside provider. Patient/Guardian was advised if they have not already done so to contact the registration department to sign all necessary forms in order for [name] to release information regarding their care.    Consent: Patient/Guardian gives verbal consent for treatment and assignment of benefits for services provided during this visit. Patient/Guardian expressed understanding and agreed to proceed.     Assessment and Plan: Counselor will continue to meet with patient address treatment plan goals. Patient was given a chance for recommendations of providers and implement skills learned in session.   Follow Up Instructions I discussed the assessment and treatment plan with the patient. The patient was provided an opportunity to ask questions and all were answered. The patient agreed with the plan and demonstrated an understanding of the instructions.   The patient was advised to call back or seek an in-person evaluation if the symptoms worsen or if the condition fails to improve as anticipated.  I provided 25 minutes of non-face-to-face time during this encounter.   Dax Murguia S, LCAS  .

## 2022-03-02 ENCOUNTER — Ambulatory Visit (INDEPENDENT_AMBULATORY_CARE_PROVIDER_SITE_OTHER): Payer: No Typology Code available for payment source | Admitting: Licensed Clinical Social Worker

## 2022-03-02 ENCOUNTER — Ambulatory Visit (HOSPITAL_COMMUNITY): Payer: No Typology Code available for payment source | Admitting: Licensed Clinical Social Worker

## 2022-03-02 DIAGNOSIS — F4310 Post-traumatic stress disorder, unspecified: Secondary | ICD-10-CM | POA: Diagnosis not present

## 2022-03-02 DIAGNOSIS — F331 Major depressive disorder, recurrent, moderate: Secondary | ICD-10-CM

## 2022-03-02 DIAGNOSIS — F411 Generalized anxiety disorder: Secondary | ICD-10-CM

## 2022-03-05 ENCOUNTER — Ambulatory Visit (HOSPITAL_COMMUNITY): Payer: No Typology Code available for payment source | Admitting: Licensed Clinical Social Worker

## 2022-03-05 ENCOUNTER — Encounter (HOSPITAL_COMMUNITY): Payer: Self-pay | Admitting: Licensed Clinical Social Worker

## 2022-03-05 NOTE — Progress Notes (Signed)
Virtual Visit via Video Note  I connected with Mark [name] on 03/02/22 at 12:00pm EST by video-enabled telemedicine with the correct person using two identifiers.   I discussed the limitations of evaluation and management by telemedicine and the availability of in person appointments. The patient expressed understanding and agreed to proceed.  [name] Patient: home Provider: home office    History of Present Illness: Pt was referred to [facility] OP therapy for PTSD, depression and anxiety by the [facility].  Treatment Goal Addressed:   Pt will reduce irritability and increase normal social interaction with family and friends and will develop the ability to recognize, accept, and cope with feelings of depression by using practiced coping skills for depression 1-2x daily AEB self report.  Progress towards Goal:  Progressing    Observations/Objective: Pt presented for today's session on time and was alert, oriented x5, with no evidence or self-report of SI/HI or A/V H.  Patient reported ongoing compliance with medication and denied any use of alcohol or illicit substances.  Clinician inquired about patient's current emotional ratings, as well as any significant changes in thoughts, feelings or behavior since previous session.  Patient reported scores of 4/10 for depression, 7/10 for anxiety, 2/10 for anger/irritability, and denied any reoccurrence of panic attacks. "My anxiety is my worse stressor now and I'm continuing to use the worksheets you've given me for coping skills." Cln and pt discussed emotional regulation skills, helping him learn to manage his feelings and to better cope with situations.  Patient was encouraged to apply emotional regulation skills and focus on the positive. Cln provided education on the 5 emotion regulation strategies. Will discuss each individually at next session.        Collaboration of Care: Other: Continue seeing providers at [place]  was advised Release of Information must be obtained prior to any record release in order to collaborate their care with an outside provider. Patient/Guardian was advised if they have not already done so to contact the registration department to sign all necessary forms in order for [name] to release information regarding their care.    Consent: Patient/Guardian gives verbal consent for treatment and assignment of benefits for services provided during this visit. Patient/Guardian expressed understanding and agreed to proceed.     Assessment and Plan: Counselor will continue to meet with patient address treatment plan goals. Patient was given a chance for recommendations of providers and implement skills learned in session.   Follow Up Instructions I discussed the assessment and treatment plan with the patient. The patient was provided an opportunity to ask questions and all were answered. The patient agreed with the plan and demonstrated an understanding of the instructions.   The patient was advised to call back or seek an in-person evaluation if the symptoms worsen or if the condition fails to improve as anticipated.  I provided 45 minutes of non-face-to-face time during this encounter.   Ophie Burrowes S, LCAS  .

## 2022-03-07 ENCOUNTER — Ambulatory Visit (HOSPITAL_COMMUNITY): Payer: No Typology Code available for payment source | Admitting: Licensed Clinical Social Worker

## 2022-03-07 ENCOUNTER — Encounter (HOSPITAL_COMMUNITY): Payer: Self-pay | Admitting: Licensed Clinical Social Worker

## 2022-03-07 DIAGNOSIS — F4310 Post-traumatic stress disorder, unspecified: Secondary | ICD-10-CM | POA: Diagnosis not present

## 2022-03-07 DIAGNOSIS — F331 Major depressive disorder, recurrent, moderate: Secondary | ICD-10-CM | POA: Diagnosis not present

## 2022-03-07 DIAGNOSIS — F411 Generalized anxiety disorder: Secondary | ICD-10-CM

## 2022-03-07 NOTE — Progress Notes (Signed)
Virtual Visit via Video Note  I connected with Mark [name] on 03/07/22 at 12:00pm EST by video-enabled telemedicine with the correct person using two identifiers.   I discussed the limitations of evaluation and management by telemedicine and the availability of in person appointments. The patient expressed understanding and agreed to proceed.  LOCATION Patient: home Provider: home office    History of Present Illness: Pt was referred to [facility] OP therapy for PTSD, depression and anxiety by the [facility].  Treatment Goal Addressed:   Pt will reduce irritability and increase normal social interaction with family and friends and will develop the ability to recognize, accept, and cope with feelings of depression by using practiced coping skills for depression 1-2x daily AEB self report.  Progress towards Goal:  Progressing    Observations/Objective: Pt presented for today's session on time and was alert, oriented x5, with no evidence or self-report of SI/HI or A/V H.  Patient reported ongoing compliance with medication and denied any use of alcohol or illicit substances.  Clinician inquired about patient's current emotional ratings, as well as any significant changes in thoughts, feelings or behavior since previous session.  Patient reported scores of 4/10 for depression, 6/10 for anxiety, 2/10 for anger/irritability, and denied any reoccurrence of panic attacks. Pt is on his way to visit extended family for a week. Pt described his anxiety symptoms due to visiting his family. Pt identified stressors that occurred in his life over the past week. We discussed more intensive options that are brain based in targeting and reducing the impact of the triggers which lead to more stress.Cln and pt discussed emotional regulation skills, helping him learn to manage his feelings and to better cope with situations.  Patient was encouraged to apply emotional regulation skills and focus on the positive. Cln  provided education on the 1/5 emotion regulation strategies. Will continue to discuss at next session.        Collaboration of Care: Other: Continue seeing providers at [place] was advised Release of Information must be obtained prior to any record release in order to collaborate their care with an outside provider. Patient/Guardian was advised if they have not already done so to contact the registration department to sign all necessary forms in order for [name] to release information regarding their care.    Consent: Patient/Guardian gives verbal consent for treatment and assignment of benefits for services provided during this visit. Patient/Guardian expressed understanding and agreed to proceed.     Assessment and Plan: Counselor will continue to meet with patient address treatment plan goals. Patient was given a chance for recommendations of providers and implement skills learned in session.   Follow Up Instructions I discussed the assessment and treatment plan with the patient. The patient was provided an opportunity to ask questions and all were answered. The patient agreed with the plan and demonstrated an understanding of the instructions.   The patient was advised to call back or seek an in-person evaluation if the symptoms worsen or if the condition fails to improve as anticipated.  I provided 45 minutes of non-face-to-face time during this encounter.   Hollyn Stucky S, LCAS  .

## 2022-03-11 ENCOUNTER — Encounter (HOSPITAL_COMMUNITY): Payer: Self-pay | Admitting: Licensed Clinical Social Worker

## 2022-03-12 ENCOUNTER — Ambulatory Visit (HOSPITAL_COMMUNITY): Payer: No Typology Code available for payment source | Admitting: Licensed Clinical Social Worker

## 2022-03-14 ENCOUNTER — Ambulatory Visit (HOSPITAL_COMMUNITY): Payer: No Typology Code available for payment source | Admitting: Licensed Clinical Social Worker

## 2022-03-15 ENCOUNTER — Ambulatory Visit (HOSPITAL_COMMUNITY): Payer: No Typology Code available for payment source | Admitting: Licensed Clinical Social Worker

## 2022-03-15 NOTE — Progress Notes (Signed)
Virtual Visit via Video Note  I connected with Mark [name] on 02/07/22 at 2:00pm EST by video-enabled telemedicine with the correct person using two identifiers.   I discussed the limitations of evaluation and management by telemedicine and the availability of in person appointments. The patient expressed understanding and agreed to proceed.  LOCATION: Patient: home Provider: home office    History of Present Illness: Pt was referred to [facility] OP therapy for PTSD, depression and anxiety by the [facility].  Treatment Goal Addressed:   Pt will reduce irritability and increase normal social interaction with family and friends and will develop the ability to recognize, accept, and cope with feelings of depression by using practiced coping skills for depression 1-2x daily AEB self report.  Progress towards Goal:  Progressing    Observations/Objective: Pt presented for today's session on time and was alert, oriented x5, with no evidence or self-report of SI/HI or A/V H.  Patient reported ongoing compliance with medication and denied any use of alcohol or illicit substances.  Clinician inquired about patient's current emotional ratings, as well as any significant changes in thoughts, feelings or behavior since previous session.  Patient reported scores of 4/10 for depression, 6/10 for anxiety, 0/10 for anger/irritability, and denied any reoccurrence of panic attacks. "My anxiety continues to be high and I'm using my coping skills worksheets to manage my increase in anxiety." Cln and pt discussed emotional regulation skills, helping him learn to manage his low frustration tolerance and learn to better cope with situations.  Patient was encouraged to increase his low frustration tolerance which will help him maintain positive areas of his life.        Collaboration of Care: Other: Continue seeing providers at [place] was advised Release of Information must be obtained prior to  any record release in order to collaborate their care with an outside provider. Patient/Guardian was advised if they have not already done so to contact the registration department to sign all necessary forms in order for [name] to release information regarding their care.    Consent: Patient/Guardian gives verbal consent for treatment and assignment of benefits for services provided during this visit. Patient/Guardian expressed understanding and agreed to proceed.     Assessment and Plan: Counselor will continue to meet with patient address treatment plan goals. Patient was given a chance for recommendations of providers and implement skills learned in session.   Follow Up Instructions I discussed the assessment and treatment plan with the patient. The patient was provided an opportunity to ask questions and all were answered. The patient agreed with the plan and demonstrated an understanding of the instructions.   The patient was advised to call back or seek an in-person evaluation if the symptoms worsen or if the condition fails to improve as anticipated.  I provided 45 minutes of non-face-to-face time during this encounter.   Gildardo Tickner S, LCAS  .

## 2022-03-18 ENCOUNTER — Encounter (HOSPITAL_COMMUNITY): Payer: Self-pay | Admitting: Licensed Clinical Social Worker

## 2022-03-18 NOTE — Progress Notes (Signed)
Virtual Visit via Video Note  I connected with [name] on 02/12/2022 at 5:00 pm EST by a video enabled telemedicine application and verified that I am speaking with the correct person using two identifiers.   I discussed the limitations of evaluation and management by telemedicine and the availability of in person appointments. The patient expressed understanding and agreed to proceed.  LOCATION: Patient: Home Provider: home Office  History of Present Illness: [facility].  Treatment Goal Addressed:  Pt will develop the ability to recognize, accept, and cope with feelings of depression AEB self report.  Progress towards Goal:  Progressing    Observation/Objective:   Patient participated in a discussion on shaping the lens through which the brain views the world, that shapes reality. Discussed changing the lens, which can change the world of personal happiness. Patient was encouraged to changing the lens to find personal happiness.      Assessment and Plan: Counselor will continue to meet with patient address treatment plan goals. Patient was given a chance for recommendations of providers and implement skills learned in session.    Follow-up instructions:I discussed the assessment and treatment plan with the patient. Patient was provided an opportunity to ask questions and all were answered. The patient agreed with the plan and demonstrated an understanding of the instructions.   The patient was advised to call back or seek an in-person evaluation if the symptoms worsen or if the condition fails to improve as anticipated.  Collaboration of Care: Other: Continue to see providers at [place] was advised Release of Information must be obtained prior to any record release in order to collaborate their care with an outside provider. Patient/Guardian was advised if they have not already done so to  contact the registration department to sign all necessary forms in order for [name] to release information regarding their care.   Consent: Patient/Guardian gives verbal consent for treatment and assignment of benefits for services provided during this visit. Patient/Guardian expressed understanding and agreed to proceed.      I provided 60 minutes of non-face-to-face time during this encounter.   Kapena Hamme S, LCAS

## 2022-03-19 ENCOUNTER — Ambulatory Visit (HOSPITAL_COMMUNITY): Payer: No Typology Code available for payment source | Admitting: Licensed Clinical Social Worker

## 2022-03-21 ENCOUNTER — Ambulatory Visit (HOSPITAL_COMMUNITY): Payer: No Typology Code available for payment source | Admitting: Licensed Clinical Social Worker

## 2022-03-22 ENCOUNTER — Ambulatory Visit (HOSPITAL_COMMUNITY): Payer: No Typology Code available for payment source | Admitting: Licensed Clinical Social Worker

## 2022-03-23 ENCOUNTER — Ambulatory Visit (HOSPITAL_COMMUNITY): Payer: No Typology Code available for payment source | Admitting: Licensed Clinical Social Worker

## 2022-03-26 ENCOUNTER — Ambulatory Visit (HOSPITAL_COMMUNITY): Payer: No Typology Code available for payment source | Admitting: Licensed Clinical Social Worker

## 2022-03-27 ENCOUNTER — Encounter (HOSPITAL_COMMUNITY): Payer: Self-pay | Admitting: Licensed Clinical Social Worker

## 2022-03-27 ENCOUNTER — Ambulatory Visit (HOSPITAL_COMMUNITY): Payer: No Typology Code available for payment source | Admitting: Licensed Clinical Social Worker

## 2022-03-27 NOTE — Progress Notes (Signed)
Virtual Visit via Video Note  I connected with Mark [name] on 02/14/22 at 2:00pm EST by video-enabled telemedicine with the correct person using two identifiers.   I discussed the limitations of evaluation and management by telemedicine and the availability of in person appointments. The patient expressed understanding and agreed to proceed.  LOCATION: Patient: home Provider: home office    History of Present Illness: Pt was referred to [facility] OP therapy for PTSD, depression and anxiety by the [facility].  Treatment Goal Addressed:   Pt will reduce irritability and increase normal social interaction with family and friends and will develop the ability to recognize, accept, and cope with feelings of depression by using practiced coping skills for depression 1-2x daily AEB self report.  Progress towards Goal:  Progressing    Observations/Objective: Pt presented for today's session on time and was alert, oriented x5, with no evidence or self-report of SI/HI or A/V H.  Patient reported ongoing compliance with medication and denied any use of alcohol or illicit substances.  Clinician inquired about patient's current emotional ratings, as well as any significant changes in thoughts, feelings or behavior since previous session.  Patient reported scores of 4/10 for depression, 6/10 for anxiety, 2/10 for anger/irritability, and denied any reoccurrence of panic attacks. "My anxiety continues to be high." Pt relayed that he notices good moments and 'really bad ones that flare my anxiety' and identified conflict with loved ones as a significant trigger for this. Pt engaged in discussion on communication styles and was receptive to feedback regarding avoidance of using language that is accusatory and instead communicating feelings/needs by utilizing 'I Statements'. Pt discussed specific scenarios in which this could be helpful and agreed to attempt utilization of them moving forward.     Collaboration  of Care: Other: Continue seeing providers at [place] was advised Release of Information must be obtained prior to any record release in order to collaborate their care with an outside provider. Patient/Guardian was advised if they have not already done so to contact the registration department to sign all necessary forms in order for [name] to release information regarding their care.    Consent: Patient/Guardian gives verbal consent for treatment and assignment of benefits for services provided during this visit. Patient/Guardian expressed understanding and agreed to proceed.     Assessment and Plan: Counselor will continue to meet with patient address treatment plan goals. Patient was given a chance for recommendations of providers and implement skills learned in session.   Follow Up Instructions I discussed the assessment and treatment plan with the patient. The patient was provided an opportunity to ask questions and all were answered. The patient agreed with the plan and demonstrated an understanding of the instructions.   The patient was advised to call back or seek an in-person evaluation if the symptoms worsen or if the condition fails to improve as anticipated.  I provided 60 minutes of non-face-to-face time during this encounter.   Kal Chait S, LCAS  .

## 2022-04-02 ENCOUNTER — Encounter (HOSPITAL_COMMUNITY): Payer: Self-pay | Admitting: Licensed Clinical Social Worker

## 2022-04-02 ENCOUNTER — Ambulatory Visit (INDEPENDENT_AMBULATORY_CARE_PROVIDER_SITE_OTHER): Payer: No Typology Code available for payment source | Admitting: Licensed Clinical Social Worker

## 2022-04-02 DIAGNOSIS — F411 Generalized anxiety disorder: Secondary | ICD-10-CM | POA: Diagnosis not present

## 2022-04-02 DIAGNOSIS — F4310 Post-traumatic stress disorder, unspecified: Secondary | ICD-10-CM

## 2022-04-02 DIAGNOSIS — F331 Major depressive disorder, recurrent, moderate: Secondary | ICD-10-CM

## 2022-04-02 NOTE — Progress Notes (Addendum)
Virtual Visit via Video Note  I connected with [name] on 04/02/2022 at 5:00 pm EST by a video enabled telemedicine application and verified that I am speaking with the correct person using two identifiers.   I discussed the limitations of evaluation and management by telemedicine and the availability of in person appointments. The patient expressed understanding and agreed to proceed.  LOCATION: Patient: Home Provider: home Office  History of Present Illness: [facility], [facility], [facility].  Treatment Goal Addressed:  Pt will develop the ability to recognize, accept, and cope with feelings of depression AEB self report.  Progress towards Goal:  Progressing    Observation/Objective:      Patient participated in a discussion on [place], applying for VA disability and Social Security disability, hiring an attorney for better outcomes. Group members participated in the discussion offering experience and suggestions.  Assessment and Plan: Counselor will continue to meet with patient address treatment plan goals. Patient was given a chance for recommendations of providers and implement skills learned in session.    Follow-up instructions:I discussed the assessment and treatment plan with the patient. Patient was provided an opportunity to ask questions and all were answered. The patient agreed with the plan and demonstrated an understanding of the instructions.   The patient was advised to call back or seek an in-person evaluation if the symptoms worsen or if the condition fails to improve as anticipated.  Collaboration of Care: Other: Continue to see providers at [place] was advised Release of Information must be obtained prior to any record release in order to collaborate their care with an outside provider. Patient/Guardian was advised if they have not already done so to contact the registration department to sign all  necessary forms in order for [name] to release information regarding their care.   Consent: Patient/Guardian gives verbal consent for treatment and assignment of benefits for services provided during this visit. Patient/Guardian expressed understanding and agreed to proceed.      I provided 75 minutes of non-face-to-face time during this encounter.   Teddy Rebstock S, LCAS

## 2022-04-03 ENCOUNTER — Ambulatory Visit (HOSPITAL_COMMUNITY): Payer: No Typology Code available for payment source | Admitting: Licensed Clinical Social Worker

## 2022-04-03 ENCOUNTER — Encounter (HOSPITAL_COMMUNITY): Payer: Self-pay | Admitting: Licensed Clinical Social Worker

## 2022-04-03 DIAGNOSIS — F331 Major depressive disorder, recurrent, moderate: Secondary | ICD-10-CM

## 2022-04-03 DIAGNOSIS — F411 Generalized anxiety disorder: Secondary | ICD-10-CM

## 2022-04-03 DIAGNOSIS — F4310 Post-traumatic stress disorder, unspecified: Secondary | ICD-10-CM

## 2022-04-03 NOTE — Progress Notes (Incomplete)
Virtual Visit via Video Note  I connected with Mark [name] on 04/03/22 at 12:00pm EST by video-enabled telemedicine with the correct person using two identifiers.   I discussed the limitations of evaluation and management by telemedicine and the availability of in person appointments. The patient expressed understanding and agreed to proceed.  LOCATION Patient: home Provider: home office    History of Present Illness: Pt was referred to [facility] OP therapy for PTSD, depression and anxiety by the [facility]  Treatment Goal Addressed:   Pt will reduce irritability and increase normal social interaction with family and friends and will develop the ability to recognize, accept, and cope with feelings of depression by using practiced coping skills for depression 1-2x daily AEB self report.  Progress towards Goal:  Progressing    Observations/Objective: Pt presented for today's session on time and was alert, oriented x5, with no evidence or self-report of SI/HI or A/V H.  Patient reported ongoing compliance with medication and denied any use of alcohol or illicit substances.  Clinician inquired about patient's current emotional ratings, as well as any significant changes in thoughts, feelings or behavior since previous session.  Patient reported scores of 4/10 for depression, 6/10 for anxiety, 2/10 for anger/irritability, and denied any reoccurrence of panic attacks. Pt has returned from visiting hi extended family, identifying stressors that occurred in his life over the past week.    We discussed more intensive options that are brain based in targeting and reducing the impact of the triggers which lead to more stress.Cln and pt discussed emotional regulation skills, helping him learn to manage his feelings and to better cope with situations.  Patient was encouraged to apply emotional regulation skills and focus on the positive. Cln provided education on the 1/5 emotion regulation strategies. Will  continue to discuss at next session.        Collaboration of Care: Other: Continue seeing providers at [place] was advised Release of Information must be obtained prior to any record release in order to collaborate their care with an outside provider. Patient/Guardian was advised if they have not already done so to contact the registration department to sign all necessary forms in order for [name] to release information regarding their care.    Consent: Patient/Guardian gives verbal consent for treatment and assignment of benefits for services provided during this visit. Patient/Guardian expressed understanding and agreed to proceed.     Assessment and Plan: Counselor will continue to meet with patient address treatment plan goals. Patient was given a chance for recommendations of providers and implement skills learned in session.   Follow Up Instructions I discussed the assessment and treatment plan with the patient. The patient was provided an opportunity to ask questions and all were answered. The patient agreed with the plan and demonstrated an understanding of the instructions.   The patient was advised to call back or seek an in-person evaluation if the symptoms worsen or if the condition fails to improve as anticipated.  I provided 60 minutes of non-face-to-face time during this encounter.   Maurianna Benard S, LCAS  .

## 2022-04-08 ENCOUNTER — Encounter (HOSPITAL_COMMUNITY): Payer: Self-pay | Admitting: Licensed Clinical Social Worker

## 2022-04-08 NOTE — Progress Notes (Signed)
Virtual Visit via Video Note  I connected with Mark [name] on 02/19/2022 at 5:00 pm EST by a video enabled telemedicine application and verified that I am speaking with the correct person using two identifiers.   I discussed the limitations of evaluation and management by telemedicine and the availability of in person appointments. The patient expressed understanding and agreed to proceed.  LOCATION: Patient: Home Provider: home Office  History of Present Illness: [facility], [facility], [facility].  Treatment Goal Addressed:  Pt will develop the ability to recognize, accept, and cope with feelings of depression AEB self report.  Progress towards Goal:  Progressing    Observation/Objective:   Patient participated in a discussion on trauma. Clinician provided psychoeducation to help patients learn how to challenge and modify unhelpful beliefs related to the trauma. Patient was encouraged to understand and conceptualize the trauma.   Assessment and Plan: Counselor will continue to meet with patient address treatment plan goals. Patient was given a chance for recommendations of providers and implement skills learned in session.    Follow-up instructions:I discussed the assessment and treatment plan with the patient. Patient was provided an opportunity to ask questions and all were answered. The patient agreed with the plan and demonstrated an understanding of the instructions.   The patient was advised to call back or seek an in-person evaluation if the symptoms worsen or if the condition fails to improve as anticipated.  Collaboration of Care: Other: Continue to see providers at [place] was advised Release of Information must be obtained prior to any record release in order to collaborate their care with an outside provider. Patient/Guardian was advised if they have not already done so to contact the registration department  to sign all necessary forms in order for [name] to release information regarding their care.   Consent: Patient/Guardian gives verbal consent for treatment and assignment of benefits for services provided during this visit. Patient/Guardian expressed understanding and agreed to proceed.      I provided 75 minutes of non-face-to-face time during this encounter.   Ingra Rother S, LCAS

## 2022-04-09 ENCOUNTER — Encounter (HOSPITAL_COMMUNITY): Payer: Self-pay | Admitting: Licensed Clinical Social Worker

## 2022-04-09 ENCOUNTER — Ambulatory Visit (INDEPENDENT_AMBULATORY_CARE_PROVIDER_SITE_OTHER): Payer: No Typology Code available for payment source | Admitting: Licensed Clinical Social Worker

## 2022-04-09 DIAGNOSIS — F411 Generalized anxiety disorder: Secondary | ICD-10-CM

## 2022-04-09 DIAGNOSIS — F4310 Post-traumatic stress disorder, unspecified: Secondary | ICD-10-CM | POA: Diagnosis not present

## 2022-04-09 DIAGNOSIS — F331 Major depressive disorder, recurrent, moderate: Secondary | ICD-10-CM | POA: Diagnosis not present

## 2022-04-09 NOTE — Progress Notes (Signed)
Virtual Visit via Video Note  I connected with Mark [name] on 04/09/2022 at 5:00 pm EST by a video enabled telemedicine application and verified that I am speaking with the correct person using two identifiers.   I discussed the limitations of evaluation and management by telemedicine and the availability of in person appointments. The patient expressed understanding and agreed to proceed.  LOCATION: Patient: Home Provider: home Office  History of Present Illness: [facility], [facility], [facility].  Treatment Goal Addressed:  Pt will develop the ability to recognize, accept, and cope with feelings of depression AEB self report.  Progress towards Goal:  Progressing    Observation/Objective:    Patient participated in a discussion on feeling emotionally overwhelmed and stressed. Patient was encouraged to use coping skills, soothing activities and grounding to help with self-soothing. Patient was encouraged to use the self-soothing activities at home between sessions when feeling overwhelmed emotionally.   Assessment and Plan: Counselor will continue to meet with patient address treatment plan goals. Patient was given a chance for recommendations of providers and implement skills learned in session.    Follow-up instructions:I discussed the assessment and treatment plan with the patient. Patient was provided an opportunity to ask questions and all were answered. The patient agreed with the plan and demonstrated an understanding of the instructions.   The patient was advised to call back or seek an in-person evaluation if the symptoms worsen or if the condition fails to improve as anticipated.  Collaboration of Care: Other: Continue to see providers at [place] was advised Release of Information must be obtained prior to any record release in order to collaborate their care with an outside provider. Patient/Guardian was  advised if they have not already done so to contact the registration department to sign all necessary forms in order for [name] to release information regarding their care.   Consent: Patient/Guardian gives verbal consent for treatment and assignment of benefits for services provided during this visit. Patient/Guardian expressed understanding and agreed to proceed.      I provided 75 minutes of non-face-to-face time during this encounter.   Urie Loughner S, LCAS

## 2022-04-10 ENCOUNTER — Encounter (HOSPITAL_COMMUNITY): Payer: Self-pay | Admitting: Licensed Clinical Social Worker

## 2022-04-10 ENCOUNTER — Ambulatory Visit (INDEPENDENT_AMBULATORY_CARE_PROVIDER_SITE_OTHER): Payer: No Typology Code available for payment source | Admitting: Licensed Clinical Social Worker

## 2022-04-10 DIAGNOSIS — F4310 Post-traumatic stress disorder, unspecified: Secondary | ICD-10-CM | POA: Diagnosis not present

## 2022-04-10 DIAGNOSIS — F411 Generalized anxiety disorder: Secondary | ICD-10-CM | POA: Diagnosis not present

## 2022-04-10 DIAGNOSIS — F331 Major depressive disorder, recurrent, moderate: Secondary | ICD-10-CM | POA: Diagnosis not present

## 2022-04-10 NOTE — Progress Notes (Signed)
Virtual Visit via Video Note  I connected with Mark [name] on 04/10/22 at 11:00pm EST by video-enabled telemedicine with the correct person using two identifiers.   I discussed the limitations of evaluation and management by telemedicine and the availability of in person appointments. The patient expressed understanding and agreed to proceed.  LOCATION Patient: home Provider: home office    History of Present Illness: Pt was referred to [facility] OP therapy for PTSD, depression and anxiety by the [facility].  Treatment Goal Addressed:   Pt will reduce irritability and increase normal social interaction with family and friends and will develop the ability to recognize, accept, and cope with feelings of depression by using practiced coping skills for depression 1-2x daily AEB self report.  Progress towards Goal:  Progressing    Observations/Objective: Pt presented for today's session on time and was alert, oriented x5, with no evidence or self-report of SI/HI or A/V H.  Patient reported ongoing compliance with medication and denied any use of alcohol or illicit substances.  Clinician inquired about patient's current emotional ratings, as well as any significant changes in thoughts, feelings or behavior since previous session.  Patient reported scores of 4/10 for depression, 7/10 for anxiety, 4/10 for anger/irritability, and denied any reoccurrence of panic attacks.  Pt dentified stressors that occurred in his life over the past week. "I'm overwhelmed, tired, sores are returning on my back, and I stopped taking my meds." Cln asked open ended questions. Cln assisted pt in focusing on the positive by applying his emotional regulation skills and emotion regulation strategies.   Collaboration of Care: Other: Continue seeing providers at [place] was advised Release of Information must be obtained prior to any record release in order to collaborate their care with an outside provider.  Patient/Guardian was advised if they have not already done so to contact the registration department to sign all necessary forms in order for [name] to release information regarding their care.    Consent: Patient/Guardian gives verbal consent for treatment and assignment of benefits for services provided during this visit. Patient/Guardian expressed understanding and agreed to proceed.     Assessment and Plan: Counselor will continue to meet with patient address treatment plan goals. Patient was given a chance for recommendations of providers and implement skills learned in session.   Follow Up Instructions I discussed the assessment and treatment plan with the patient. The patient was provided an opportunity to ask questions and all were answered. The patient agreed with the plan and demonstrated an understanding of the instructions.   The patient was advised to call back or seek an in-person evaluation if the symptoms worsen or if the condition fails to improve as anticipated.  I provided 45 minutes of non-face-to-face time during this encounter.   Mark [name] S, LCAS  .

## 2022-04-16 ENCOUNTER — Ambulatory Visit (INDEPENDENT_AMBULATORY_CARE_PROVIDER_SITE_OTHER): Payer: No Typology Code available for payment source | Admitting: Licensed Clinical Social Worker

## 2022-04-16 DIAGNOSIS — F4310 Post-traumatic stress disorder, unspecified: Secondary | ICD-10-CM | POA: Diagnosis not present

## 2022-04-16 DIAGNOSIS — F331 Major depressive disorder, recurrent, moderate: Secondary | ICD-10-CM

## 2022-04-16 DIAGNOSIS — F411 Generalized anxiety disorder: Secondary | ICD-10-CM

## 2022-04-17 ENCOUNTER — Encounter (HOSPITAL_COMMUNITY): Payer: Self-pay | Admitting: Licensed Clinical Social Worker

## 2022-04-17 ENCOUNTER — Ambulatory Visit (HOSPITAL_COMMUNITY): Payer: No Typology Code available for payment source | Admitting: Licensed Clinical Social Worker

## 2022-04-17 NOTE — Progress Notes (Signed)
Virtual Visit via Video Note  I connected with Mark [name] on 04/16/2022 at 5:00 pm EST by a video enabled telemedicine application and verified that I am speaking with the correct person using two identifiers.   I discussed the limitations of evaluation and management by telemedicine and the availability of in person appointments. The patient expressed understanding and agreed to proceed.  LOCATION: Patient: Home Provider: home Office  History of Present Illness: [facility]  Treatment Goal Addressed:  Pt will develop the ability to recognize, accept, and cope with feelings of depression AEB self report.  Progress towards Goal:  Progressing    Observation/Objective:                           Pt participated in a discussion on changing emotional responses, regulating emotions    effectively and changing emotional responses. Encouraged patient to use his coping skills to more effectively regulate his emotions.  Assessment and Plan: Counselor will continue to meet with patient address treatment plan goals. Patient was given a chance for recommendations of providers and implement skills learned in session.    Follow-up instructions:I discussed the assessment and treatment plan with the patient. Patient was provided an opportunity to ask questions and all were answered. The patient agreed with the plan and demonstrated an understanding of the instructions.   The patient was advised to call back or seek an in-person evaluation if the symptoms worsen or if the condition fails to improve as anticipated.  Collaboration of Care: Other: Continue to see providers at [place] was advised Release of Information must be obtained prior to any record release in order to collaborate their care with an outside provider. Patient/Guardian was advised if they have not already done so to contact the registration  department to sign all necessary forms in order for [name] to release information regarding their care.   Consent: Patient/Guardian gives verbal consent for treatment and assignment of benefits for services provided during this visit. Patient/Guardian expressed understanding and agreed to proceed.      I provided 75 minutes of non-face-to-face time during this encounter.   Sylvester Salonga S, LCAS

## 2022-04-19 ENCOUNTER — Ambulatory Visit (HOSPITAL_COMMUNITY): Payer: No Typology Code available for payment source | Admitting: Licensed Clinical Social Worker

## 2022-04-23 ENCOUNTER — Encounter (HOSPITAL_COMMUNITY): Payer: Self-pay | Admitting: Licensed Clinical Social Worker

## 2022-04-23 ENCOUNTER — Ambulatory Visit (INDEPENDENT_AMBULATORY_CARE_PROVIDER_SITE_OTHER): Payer: No Typology Code available for payment source | Admitting: Licensed Clinical Social Worker

## 2022-04-23 DIAGNOSIS — F4310 Post-traumatic stress disorder, unspecified: Secondary | ICD-10-CM | POA: Diagnosis not present

## 2022-04-23 DIAGNOSIS — F411 Generalized anxiety disorder: Secondary | ICD-10-CM

## 2022-04-23 DIAGNOSIS — F331 Major depressive disorder, recurrent, moderate: Secondary | ICD-10-CM | POA: Diagnosis not present

## 2022-04-23 NOTE — Progress Notes (Signed)
Virtual Visit via Video Note  I connected with Mark [name] on 04/23/2022 at 5:00 pm EST by a video enabled telemedicine application and verified that I am speaking with the correct person using two identifiers.   I discussed the limitations of evaluation and management by telemedicine and the availability of in person appointments. The patient expressed understanding and agreed to proceed.  LOCATION: Patient: Home Provider: home Office  History of Present Illness: [facility], [facility], [facility].  Treatment Goal Addressed:  Pt will develop the ability to recognize, accept, and cope with feelings of depression AEB self report.  Progress towards Goal:  Progressing    Observation/Objective:   Patient participated in a discussion on [place], applying for disability and Social Security disability. A new veteran joined the group tonight. She introduced herself, describing her military experience, physical/mental disabilities. Other group members introduced themselves and participated in the discussion offering experience and suggestions.                          Assessment and Plan: Counselor will continue to meet with patient address treatment plan goals. Patient was given a chance for recommendations of providers and implement skills learned in session.    Follow-up instructions:I discussed the assessment and treatment plan with the patient. Patient was provided an opportunity to ask questions and all were answered. The patient agreed with the plan and demonstrated an understanding of the instructions.   The patient was advised to call back or seek an in-person evaluation if the symptoms worsen or if the condition fails to improve as anticipated.  Collaboration of Care: Other: Continue to see providers at [place] was advised Release of Information must be obtained prior to any record release in order to collaborate their care  with an outside provider. Patient/Guardian was advised if they have not already done so to contact the registration department to sign all necessary forms in order for [name] to release information regarding their care.   Consent: Patient/Guardian gives verbal consent for treatment and assignment of benefits for services provided during this visit. Patient/Guardian expressed understanding and agreed to proceed.      I provided 60 minutes of non-face-to-face time during this encounter.   Hydie Langan S, LCAS

## 2022-04-26 ENCOUNTER — Ambulatory Visit (HOSPITAL_COMMUNITY): Payer: No Typology Code available for payment source | Admitting: Licensed Clinical Social Worker

## 2022-04-26 ENCOUNTER — Encounter (HOSPITAL_COMMUNITY): Payer: Self-pay

## 2022-04-30 ENCOUNTER — Ambulatory Visit (INDEPENDENT_AMBULATORY_CARE_PROVIDER_SITE_OTHER): Payer: No Typology Code available for payment source | Admitting: Licensed Clinical Social Worker

## 2022-04-30 ENCOUNTER — Encounter (HOSPITAL_COMMUNITY): Payer: Self-pay | Admitting: Licensed Clinical Social Worker

## 2022-04-30 DIAGNOSIS — F411 Generalized anxiety disorder: Secondary | ICD-10-CM

## 2022-04-30 DIAGNOSIS — F331 Major depressive disorder, recurrent, moderate: Secondary | ICD-10-CM | POA: Diagnosis not present

## 2022-04-30 DIAGNOSIS — F4310 Post-traumatic stress disorder, unspecified: Secondary | ICD-10-CM

## 2022-04-30 NOTE — Progress Notes (Signed)
Virtual Visit via Video Note  I connected with Mark [name] on 04/30/2022 at 5:00 pm EST by a video enabled telemedicine application and verified that I am speaking with the correct person using two identifiers.   I discussed the limitations of evaluation and management by telemedicine and the availability of in person appointments. The patient expressed understanding and agreed to proceed.  LOCATION: Patient: Home Provider: home Office  History of Present Illness: [facility], [facility], [facility].  Treatment Goal Addressed:  Pt will develop the ability to recognize, accept, and cope with feelings of depression AEB self report.  Progress towards Goal:  Progressing    Observation/Objective:                           Patient participated in a discussion on overcoming life's challenges, under stress, which makes it more difficult to overcome obstacles in life. Patient was encouraged to identify life challenges and explore strategies for overcoming obstacles.   Assessment and Plan: Counselor will continue to meet with patient address treatment plan goals. Patient was given a chance for recommendations of providers and implement skills learned in session.    Follow-up instructions:I discussed the assessment and treatment plan with the patient. Patient was provided an opportunity to ask questions and all were answered. The patient agreed with the plan and demonstrated an understanding of the instructions.   The patient was advised to call back or seek an in-person evaluation if the symptoms worsen or if the condition fails to improve as anticipated.  Collaboration of Care: Other: Continue to see providers at [facility]  Patient/Guardian was advised Release of Information must be obtained prior to any record release in order to collaborate their care with an outside provider. Patient/Guardian was advised if they have not already done so to contact  the registration department to sign all necessary forms in order for [name] to release information regarding their care.   Consent: Patient/Guardian gives verbal consent for treatment and assignment of benefits for services provided during this visit. Patient/Guardian expressed understanding and agreed to proceed.      I provided 90 minutes of non-face-to-face time during this encounter.   [name], LCAS 04/30/22

## 2022-05-03 ENCOUNTER — Ambulatory Visit (HOSPITAL_COMMUNITY): Payer: No Typology Code available for payment source | Admitting: Licensed Clinical Social Worker

## 2022-05-07 ENCOUNTER — Encounter (HOSPITAL_COMMUNITY): Payer: Self-pay | Admitting: Licensed Clinical Social Worker

## 2022-05-07 ENCOUNTER — Ambulatory Visit (INDEPENDENT_AMBULATORY_CARE_PROVIDER_SITE_OTHER): Payer: No Typology Code available for payment source | Admitting: Licensed Clinical Social Worker

## 2022-05-07 DIAGNOSIS — F4310 Post-traumatic stress disorder, unspecified: Secondary | ICD-10-CM | POA: Diagnosis not present

## 2022-05-07 DIAGNOSIS — F411 Generalized anxiety disorder: Secondary | ICD-10-CM

## 2022-05-07 DIAGNOSIS — F331 Major depressive disorder, recurrent, moderate: Secondary | ICD-10-CM

## 2022-05-07 NOTE — Progress Notes (Signed)
Virtual Visit via Video Note  I connected with Mark [name] on 05/07/2022 at 5:00 pm EST by a video enabled telemedicine application and verified that I am speaking with the correct person using two identifiers.   I discussed the limitations of evaluation and management by telemedicine and the availability of in person appointments. The patient expressed understanding and agreed to proceed.  LOCATION: Patient: Home Provider: home Office  History of Present Illness: [facility], [facility], [facility].  Treatment Goal Addressed:  Pt will develop the ability to recognize, accept, and cope with feelings of depression AEB self report.  Progress towards Goal:  Progressing    Observation/Objective:       Cln provided education on diversion strategies that allows you to stop thinking about the situation contributing to distressed emotions for a period of time. Cln suggested patient use the diversion strategies when the warning signs become overwhelming emotions.                        Assessment and Plan: Counselor will continue to meet with patient address treatment plan goals. Patient was given a chance for recommendations of providers and implement skills learned in session.    Follow-up instructions:I discussed the assessment and treatment plan with the patient. Patient was provided an opportunity to ask questions and all were answered. The patient agreed with the plan and demonstrated an understanding of the instructions.   The patient was advised to call back or seek an in-person evaluation if the symptoms worsen or if the condition fails to improve as anticipated.  Collaboration of Care: Other: Continue to see providers at [facility]  Patient/Guardian was advised Release of Information must be obtained prior to any record release in order to collaborate their care with an outside provider. Patient/Guardian was advised if they have not already  done so to contact the registration department to sign all necessary forms in order for [name] to release information regarding their care.   Consent: Patient/Guardian gives verbal consent for treatment and assignment of benefits for services provided during this visit. Patient/Guardian expressed understanding and agreed to proceed.      I provided 90 minutes of non-face-to-face time during this encounter.   Braileigh Landenberger S, LCAS 05/07/22

## 2022-05-10 ENCOUNTER — Ambulatory Visit (INDEPENDENT_AMBULATORY_CARE_PROVIDER_SITE_OTHER): Payer: No Typology Code available for payment source | Admitting: Licensed Clinical Social Worker

## 2022-05-10 DIAGNOSIS — F331 Major depressive disorder, recurrent, moderate: Secondary | ICD-10-CM

## 2022-05-10 DIAGNOSIS — F4310 Post-traumatic stress disorder, unspecified: Secondary | ICD-10-CM | POA: Diagnosis not present

## 2022-05-10 DIAGNOSIS — F411 Generalized anxiety disorder: Secondary | ICD-10-CM | POA: Diagnosis not present

## 2022-05-14 ENCOUNTER — Encounter (HOSPITAL_COMMUNITY): Payer: Self-pay | Admitting: Licensed Clinical Social Worker

## 2022-05-14 ENCOUNTER — Ambulatory Visit (HOSPITAL_COMMUNITY): Payer: No Typology Code available for payment source | Admitting: Licensed Clinical Social Worker

## 2022-05-14 DIAGNOSIS — F4310 Post-traumatic stress disorder, unspecified: Secondary | ICD-10-CM

## 2022-05-14 DIAGNOSIS — F331 Major depressive disorder, recurrent, moderate: Secondary | ICD-10-CM | POA: Diagnosis not present

## 2022-05-14 DIAGNOSIS — F411 Generalized anxiety disorder: Secondary | ICD-10-CM

## 2022-05-14 NOTE — Progress Notes (Addendum)
Virtual Visit via Video Note  I connected with Mark [name] on 05/07/2022 at 5:00 pm EST by a video enabled telemedicine application and verified that I am speaking with the correct person using two identifiers.   I discussed the limitations of evaluation and management by telemedicine and the availability of in person appointments. The patient expressed understanding and agreed to proceed.  LOCATION: Patient: Home Provider: home Office  History of Present Illness: [facility], [facility], [facility].  Treatment Goal Addressed:  Pt will develop the ability to recognize, accept, and cope with feelings of depression AEB self report.  Progress towards Goal:  Progressing    Observation/Objective:       Patient participated in a discussion on making healthy habits. Clinician provided psychoeducation to the group on making healthy choices to make patients feel better and live longer.  Patient was encouraged to make healthy habits.   Assessment and Plan: Counselor will continue to meet with patient address treatment plan goals. Patient was given a chance for recommendations of providers and implement skills learned in session.    Follow-up instructions:I discussed the assessment and treatment plan with the patient. Patient was provided an opportunity to ask questions and all were answered. The patient agreed with the plan and demonstrated an understanding of the instructions.   The patient was advised to call back or seek an in-person evaluation if the symptoms worsen or if the condition fails to improve as anticipated.  Collaboration of Care: Other: Continue to see providers at [facility]  Patient/Guardian was advised Release of Information must be obtained prior to any record release in order to collaborate their care with an outside provider. Patient/Guardian was advised if they have not already done so to contact the registration department to  sign all necessary forms in order for [name] to release information regarding their care.   Consent: Patient/Guardian gives verbal consent for treatment and assignment of benefits for services provided during this visit. Patient/Guardian expressed understanding and agreed to proceed.      I provided 75 minutes of non-face-to-face time during this encounter.   [name], LCAS 05/14/22

## 2022-05-17 ENCOUNTER — Ambulatory Visit (INDEPENDENT_AMBULATORY_CARE_PROVIDER_SITE_OTHER): Payer: No Typology Code available for payment source | Admitting: Licensed Clinical Social Worker

## 2022-05-17 DIAGNOSIS — F411 Generalized anxiety disorder: Secondary | ICD-10-CM | POA: Diagnosis not present

## 2022-05-17 DIAGNOSIS — F331 Major depressive disorder, recurrent, moderate: Secondary | ICD-10-CM | POA: Diagnosis not present

## 2022-05-17 DIAGNOSIS — F4310 Post-traumatic stress disorder, unspecified: Secondary | ICD-10-CM

## 2022-05-21 ENCOUNTER — Encounter (HOSPITAL_COMMUNITY): Payer: Self-pay | Admitting: Licensed Clinical Social Worker

## 2022-05-21 ENCOUNTER — Ambulatory Visit (HOSPITAL_COMMUNITY): Payer: No Typology Code available for payment source | Admitting: Licensed Clinical Social Worker

## 2022-05-21 DIAGNOSIS — F331 Major depressive disorder, recurrent, moderate: Secondary | ICD-10-CM | POA: Diagnosis not present

## 2022-05-21 DIAGNOSIS — F411 Generalized anxiety disorder: Secondary | ICD-10-CM

## 2022-05-21 DIAGNOSIS — F4310 Post-traumatic stress disorder, unspecified: Secondary | ICD-10-CM

## 2022-05-21 NOTE — Progress Notes (Signed)
Virtual Visit via Video Group Note  I connected with Mark [name] on 05/21/2022 at 5:00 pm EST by a video enabled telemedicine application and verified that I am speaking with the correct person using two identifiers.   I discussed the limitations of evaluation and management by telemedicine and the availability of in person appointments. The patient expressed understanding and agreed to proceed.  LOCATION: Patient: Home Provider: home Office  History of Present Illness: [facility], [facility], [facility].  Treatment Goal Addressed:  Pt will develop the ability to recognize, accept, and cope with feelings of depression AEB self report.  Progress towards Goal:  Progressing    Observation/Objective:       Pt participated in a discussion on living life with optimism. If you focus on optimism, you will change your life and live a better, more positive life. Pt was encouraged to live life with more optimism.    Assessment and Plan: Counselor will continue to meet with patient address treatment plan goals. Patient was given a chance for recommendations of providers and implement skills learned in session.    Follow-up instructions:I discussed the assessment and treatment plan with the patient. Patient was provided an opportunity to ask questions and all were answered. The patient agreed with the plan and demonstrated an understanding of the instructions.   The patient was advised to call back or seek an in-person evaluation if the symptoms worsen or if the condition fails to improve as anticipated.  Collaboration of Care: Other: Continue to see providers at [facility]  Patient/Guardian was advised Release of Information must be obtained prior to any record release in order to collaborate their care with an outside provider. Patient/Guardian was advised if they have not already done so to contact the registration department to sign all necessary  forms in order for [name] to release information regarding their care.   Consent: Patient/Guardian gives verbal consent for treatment and assignment of benefits for services provided during this visit. Patient/Guardian expressed understanding and agreed to proceed.      I provided 75 minutes of non-face-to-face time during this encounter.   Joriel Streety S, LCAS 05/21/22

## 2022-05-24 ENCOUNTER — Emergency Department (HOSPITAL_COMMUNITY): Payer: No Typology Code available for payment source

## 2022-05-24 ENCOUNTER — Emergency Department (HOSPITAL_COMMUNITY)
Admission: EM | Admit: 2022-05-24 | Discharge: 2022-05-24 | Disposition: A | Discharge status: Home / Self Care | Payer: No Typology Code available for payment source | Attending: Emergency Medicine | Admitting: Emergency Medicine

## 2022-05-24 ENCOUNTER — Encounter (HOSPITAL_COMMUNITY): Payer: Self-pay | Admitting: Licensed Clinical Social Worker

## 2022-05-24 ENCOUNTER — Ambulatory Visit (HOSPITAL_COMMUNITY): Payer: No Typology Code available for payment source | Admitting: Licensed Clinical Social Worker

## 2022-05-24 ENCOUNTER — Encounter (HOSPITAL_COMMUNITY): Payer: Self-pay

## 2022-05-24 DIAGNOSIS — I4719 Other supraventricular tachycardia: Secondary | ICD-10-CM

## 2022-05-24 DIAGNOSIS — Z794 Long term (current) use of insulin: Secondary | ICD-10-CM | POA: Diagnosis not present

## 2022-05-24 DIAGNOSIS — F4310 Post-traumatic stress disorder, unspecified: Secondary | ICD-10-CM

## 2022-05-24 DIAGNOSIS — Z79899 Other long term (current) drug therapy: Secondary | ICD-10-CM | POA: Insufficient documentation

## 2022-05-24 DIAGNOSIS — R0789 Other chest pain: Secondary | ICD-10-CM | POA: Diagnosis present

## 2022-05-24 DIAGNOSIS — F331 Major depressive disorder, recurrent, moderate: Secondary | ICD-10-CM

## 2022-05-24 DIAGNOSIS — L0591 Pilonidal cyst without abscess: Secondary | ICD-10-CM | POA: Insufficient documentation

## 2022-05-24 DIAGNOSIS — E119 Type 2 diabetes mellitus without complications: Secondary | ICD-10-CM | POA: Insufficient documentation

## 2022-05-24 DIAGNOSIS — I471 Supraventricular tachycardia, unspecified: Secondary | ICD-10-CM | POA: Insufficient documentation

## 2022-05-24 DIAGNOSIS — I10 Essential (primary) hypertension: Secondary | ICD-10-CM | POA: Insufficient documentation

## 2022-05-24 DIAGNOSIS — F411 Generalized anxiety disorder: Secondary | ICD-10-CM | POA: Diagnosis not present

## 2022-05-24 LAB — CBC WITH DIFFERENTIAL/PLATELET
Abs Immature Granulocytes: 0.01 10*3/uL (ref 0.00–0.07)
Abs Immature Granulocytes: 0.04 10*3/uL (ref 0.00–0.07)
Basophils Absolute: 0 10*3/uL (ref 0.0–0.1)
Basophils Absolute: 0 10*3/uL (ref 0.0–0.1)
Basophils Relative: 0 %
Basophils Relative: 0 %
Eosinophils Absolute: 0.1 10*3/uL (ref 0.0–0.5)
Eosinophils Absolute: 0.2 10*3/uL (ref 0.0–0.5)
Eosinophils Relative: 1 %
Eosinophils Relative: 2 %
HCT: 33.2 % — ABNORMAL LOW (ref 39.0–52.0)
HCT: 35 % — ABNORMAL LOW (ref 39.0–52.0)
Hemoglobin: 10.6 g/dL — ABNORMAL LOW (ref 13.0–17.0)
Hemoglobin: 11.7 g/dL — ABNORMAL LOW (ref 13.0–17.0)
Immature Granulocytes: 0 %
Immature Granulocytes: 0 %
Lymphocytes Relative: 30 %
Lymphocytes Relative: 23 %
Lymphs Abs: 1.3 10*3/uL (ref 0.7–4.0)
Lymphs Abs: 2.1 10*3/uL (ref 0.7–4.0)
MCH: 27.7 pg (ref 26.0–34.0)
MCH: 28.1 pg (ref 26.0–34.0)
MCHC: 31.9 g/dL (ref 30.0–36.0)
MCHC: 33.4 g/dL (ref 30.0–36.0)
MCV: 86.9 fL (ref 80.0–100.0)
MCV: 83.9 fL (ref 80.0–100.0)
Monocytes Absolute: 0.3 10*3/uL (ref 0.1–1.0)
Monocytes Absolute: 0.7 10*3/uL (ref 0.1–1.0)
Monocytes Relative: 6 %
Monocytes Relative: 7 %
Neutro Abs: 2.7 10*3/uL (ref 1.7–7.7)
Neutro Abs: 6.2 10*3/uL (ref 1.7–7.7)
Neutrophils Relative %: 63 %
Neutrophils Relative %: 68 %
Platelets: 20 10*3/uL — CL (ref 150–400)
Platelets: 183 10*3/uL (ref 150–400)
RBC: 3.82 MIL/uL — ABNORMAL LOW (ref 4.22–5.81)
RBC: 4.17 MIL/uL — ABNORMAL LOW (ref 4.22–5.81)
RDW: 14.1 % (ref 11.5–15.5)
RDW: 14.2 % (ref 11.5–15.5)
WBC: 4.4 10*3/uL (ref 4.0–10.5)
WBC: 9.2 10*3/uL (ref 4.0–10.5)
nRBC: 0 % (ref 0.0–0.2)
nRBC: 0 % (ref 0.0–0.2)

## 2022-05-24 LAB — TROPONIN I (HIGH SENSITIVITY)
Troponin I (High Sensitivity): 18 ng/L — ABNORMAL HIGH (ref <18)
Troponin I (High Sensitivity): 18 ng/L — ABNORMAL HIGH (ref <18)

## 2022-05-24 LAB — COMPREHENSIVE METABOLIC PANEL
ALT: 16 U/L (ref 0–44)
AST: 22 U/L (ref 15–41)
Albumin: 3.1 g/dL — ABNORMAL LOW (ref 3.5–5.0)
Alkaline Phosphatase: 74 U/L (ref 38–126)
Anion gap: 9 (ref 5–15)
BUN: 13 mg/dL (ref 8–23)
CO2: 23 mmol/L (ref 22–32)
Calcium: 8.8 mg/dL — ABNORMAL LOW (ref 8.9–10.3)
Chloride: 103 mmol/L (ref 98–111)
Creatinine, Ser: 1.04 mg/dL (ref 0.61–1.24)
GFR, Estimated: >60 mL/min (ref >60)
Glucose, Bld: 137 mg/dL — ABNORMAL HIGH (ref 70–99)
Potassium: 4.3 mmol/L (ref 3.5–5.1)
Sodium: 135 mmol/L (ref 135–145)
Total Bilirubin: 0.6 mg/dL (ref 0.3–1.2)
Total Protein: 6.4 g/dL — ABNORMAL LOW (ref 6.5–8.1)

## 2022-05-24 LAB — TSH: TSH: 1.522 u[IU]/mL (ref 0.350–4.500)

## 2022-05-24 LAB — MAGNESIUM: Magnesium: 2 mg/dL (ref 1.7–2.4)

## 2022-05-24 MED ORDER — CARVEDILOL 3.125 MG PO TABS: 3.1250 mg | ORAL_TABLET | Freq: Two times a day (BID) | ORAL | 0 refills | Status: AC

## 2022-05-24 MED ORDER — DOXYCYCLINE HYCLATE 100 MG PO CAPS: 100.0000 mg | ORAL_CAPSULE | Freq: Two times a day (BID) | ORAL | 0 refills | Status: DC

## 2022-05-24 MED ORDER — LOSARTAN POTASSIUM 50 MG PO TABS: 50.0000 mg | ORAL_TABLET | Freq: Every day | ORAL | 0 refills | Status: AC

## 2022-05-24 MED ORDER — DOXYCYCLINE HYCLATE 100 MG PO TABS
100.0000 mg | ORAL_TABLET | Freq: Once | ORAL | Status: AC
  Administered 2022-05-24: 100 mg via ORAL
  Filled 2022-05-24: qty 1

## 2022-05-24 NOTE — ED Provider Notes (Signed)
[facility] AT [facility] Provider Note   CSN: [id number] Arrival date & time: 05/24/22  1725     History  No chief complaint on file.   Mark [name] is a 62 y.o. male.  Pt is a 62 yo male with pmhx significant for dm2, depression, HTN, anxiety, hld, PTSD, s/p R AKA.  Pt wears an Apple watch.  Today, it notified him that his HR was over 120.  Pt had associated CP.  He called the [place] and they told him to call EMS.  He decided to drive here instead.  Pt has never had problems with rapid HR.  Pt was recently put on Losartan.  He was on Coreg, but is not sure why he is not on it or why the Losartan was started.  Pt is also developing a pilonidal cyst that he's had in the past.  It is draining.       Home Medications Prior to Admission medications   Medication Sig Start Date End Date Taking? Authorizing Provider  doxycycline (VIBRAMYCIN) 100 MG capsule Take 1 capsule (100 mg total) by mouth 2 (two) times daily. 05/24/22  Yes Jacalyn Lefevre, MD  losartan (COZAAR) 50 MG tablet Take 1 tablet (50 mg total) by mouth daily. 05/24/22  Yes Jacalyn Lefevre, MD  ARIPiprazole (ABILIFY) 10 MG tablet Take 10 mg by mouth daily.    [provider]  buPROPion (WELLBUTRIN XL) 150 MG 24 hr tablet Take 1 tablet (150 mg total) by mouth daily. 04/27/21   Thresa Ross, MD  busPIRone (BUSPAR) 15 MG tablet Take 1 tablet (15 mg total) by mouth 3 (three) times daily. 08/19/19   Arfeen, Phillips Grout, MD  carvedilol (COREG) 3.125 MG tablet Take 1 tablet (3.125 mg total) by mouth 2 (two) times daily with a meal. 05/24/22   Jacalyn Lefevre, MD  clindamycin (CLEOCIN T) 1 % external solution Apply 1 Application topically 2 (two) times daily.    [provider]  cyclobenzaprine (FLEXERIL) 10 MG tablet Take 10 mg by mouth 2 (two) times daily as needed for muscle spasms. 10/04/20   [provider]  diclofenac Sodium (VOLTAREN) 1 % GEL Apply 2 g topically daily as needed (pain).  10/04/20   [provider]  escitalopram (LEXAPRO) 20 MG tablet Take 20 mg by mouth daily.    [provider]  hydrOXYzine (VISTARIL) 25 MG capsule Take 25-50 mg by mouth 2 (two) times daily as needed for anxiety.    [provider]  insulin aspart (NOVOLOG) 100 UNIT/ML injection Inject 15 Units into the skin 3 (three) times daily before meals. 07/25/21   Lyndle Herrlich, MD  insulin glargine (LANTUS) 100 UNIT/ML injection Inject 0.3 mLs (30 Units total) into the skin 2 (two) times daily. 07/25/21   Lyndle Herrlich, MD  Menthol-Methyl Salicylate (THERA-GESIC) 1-15 % CREA Apply 1 Application topically 2 (two) times daily as needed for pain. 03/23/21   [provider]  modafinil (PROVIGIL) 100 MG tablet Take 100 mg by mouth daily.    [provider]  ondansetron (ZOFRAN) 4 MG tablet Take 1 tablet (4 mg total) by mouth every 6 (six) hours as needed for nausea. 07/25/21   Lyndle Herrlich, MD  prazosin (MINIPRESS) 2 MG capsule Take 1 capsule (2 mg total) by mouth at bedtime. 03/24/21   Thresa Ross, MD  sildenafil (VIAGRA) 100 MG tablet Take 100 mg by mouth daily as needed for erectile dysfunction.    [provider]  silver sulfADIAZINE (SILVADENE) 1 % cream Apply 1 Application topically daily as needed (affected area).    [provider]  traZODone (DESYREL) 50 MG tablet Take 1 tablet (50 mg total) by mouth at bedtime as needed. Patient taking differently: Take 50 mg by mouth at bedtime as needed for sleep. 08/19/19   Arfeen, Phillips Grout, MD      Allergies    Ozempic (0.25 or 0.5 mg-dose) [semaglutide(0.25 or 0.5mg -dos)], Semaglutide, and Lyrica [pregabalin]    Review of Systems   Review of Systems  Respiratory:  Positive for chest tightness.   Cardiovascular:  Positive for palpitations.  All other systems reviewed and are negative.   Physical Exam Updated Vital Signs BP 125/60   Pulse 78   Temp 97.7 F (36.5 C) (Oral)    Resp 15   SpO2 100%  Physical Exam Vitals and nursing note reviewed.  Constitutional:      Appearance: Normal appearance. He is obese.  HENT:     Head: Normocephalic and atraumatic.     Right Ear: External ear normal.     Left Ear: External ear normal.     Nose: Nose normal.     Mouth/Throat:     Mouth: Mucous membranes are dry.  Eyes:     Extraocular Movements: Extraocular movements intact.     Conjunctiva/sclera: Conjunctivae normal.     Pupils: Pupils are equal, round, and reactive to light.  Cardiovascular:     Rate and Rhythm: Regular rhythm. Tachycardia present.     Pulses: Normal pulses.     Heart sounds: Normal heart sounds.  Pulmonary:     Effort: Pulmonary effort is normal.     Breath sounds: Normal breath sounds.  Abdominal:     General: Abdomen is flat. Bowel sounds are normal.     Palpations: Abdomen is soft.  Musculoskeletal:     Cervical back: Normal range of motion and neck supple.     Comments: Right AKA  Skin:    General: Skin is warm.     Capillary Refill: Capillary refill takes less than 2 seconds.     Comments: No abscess to drain.  Some mild d/c.  Neurological:     General: No focal deficit present.     Mental Status: He is alert and oriented to person, place, and time.  Psychiatric:        Mood and Affect: Mood normal.        Behavior: Behavior normal.     ED Results / Procedures / Treatments   Labs (all labs ordered are listed, but only abnormal results are displayed) Labs Reviewed  CBC WITH DIFFERENTIAL/PLATELET - Abnormal; Notable for the following components:      Result Value   RBC 3.82 (*)    Hemoglobin 10.6 (*)    HCT 33.2 (*)    Platelets 20 (*)    All other components within normal limits  COMPREHENSIVE METABOLIC PANEL - Abnormal; Notable for the following components:   Glucose, Bld 137 (*)    Calcium 8.8 (*)    Total Protein 6.4 (*)    Albumin 3.1 (*)    All other components within normal limits  CBC WITH  DIFFERENTIAL/PLATELET - Abnormal; Notable for the following components:   RBC 4.17 (*)    Hemoglobin 11.7 (*)    HCT 35.0 (*)    All other components within normal limits  TROPONIN I (HIGH SENSITIVITY) - Abnormal; Notable for the following components:   Troponin I (High  Sensitivity) 18 (*)    All other components within normal limits  TROPONIN I (HIGH SENSITIVITY) - Abnormal; Notable for the following components:   Troponin I (High Sensitivity) 18 (*)    All other components within normal limits  TSH  MAGNESIUM    EKG EKG Interpretation  Date/Time:  Thursday May 24 2022 18:30:03 EDT Ventricular Rate:  88 PR Interval:  161 QRS Duration: 91 QT Interval:  338 QTC Calculation: 409 R Axis:   60 Text Interpretation: Sinus rhythm Ventricular trigeminy Low voltage, precordial leads Anteroseptal infarct, old Nonspecific T abnormalities, lateral leads spontaneous change Confirmed by Jacalyn Lefevre [phone]) on 05/24/2022 6:37:56 PM  Radiology DG Chest Portable 1 View  Result Date: 05/24/2022 CLINICAL DATA:  Chest pain EXAM: PORTABLE CHEST 1 VIEW COMPARISON:  X-ray 07/21/2021 FINDINGS: Hyperinflation. No consolidation, pneumothorax or effusion. No edema. The left inferior costophrenic angle is clipped off the edge of the film. Normal cardiopericardial silhouette. Overlapping cardiac leads. IMPRESSION: Hyperinflation with chronic changes. The left inferior costophrenic angle is clipped off the edge of the film. Electronically Signed   By: Karen Kays M.D.   On: 05/24/2022 18:16    Procedures Procedures    Medications Ordered in ED Medications - No data to display  ED Course/ Medical Decision Making/ A&P                             Medical Decision Making Amount and/or Complexity of Data Reviewed Labs: ordered.   This patient presents to the ED for concern of tachycardia, this involves an extensive number of treatment options, and is a complaint that carries with it a high risk of  complications and morbidity.  The differential diagnosis includes afib, svt, vt, st   Co morbidities that complicate the patient evaluation  dm2, depression, HTN, anxiety, hld, PTSD, s/p R AKA   Additional history obtained:  Additional history obtained from epic chart review  Lab Tests:  I Ordered, and personally interpreted labs.  The pertinent results include:  cbc with hgb 11.7, mg 2.0, cmp nl, trop 18   Imaging Studies ordered:  I ordered imaging studies including cxr  I independently visualized and interpreted imaging which showed  Hyperinflation with chronic changes. The left inferior costophrenic  angle is clipped off the edge of the film.   I agree with the radiologist interpretation   Cardiac Monitoring:  The patient was maintained on a cardiac monitor.  I personally viewed and interpreted the cardiac monitored which showed an underlying rhythm of: artial tachy, now nsr   Medicines ordered and prescription drug management:  I ordered medication including doxy  for cyst  Reevaluation of the patient after these medicines showed that the patient improved I have reviewed the patients home medicines and have made adjustments as needed   Consultations Obtained:  I requested consultation with the cardiologist (Dr. Mayford Knife),  and discussed lab and imaging findings as well as pertinent plan - they recommend: BB and outpatient f/u   Problem List / ED Course:  Atrial tachycardia:  I reviewed the EKG with the cardiologist.  He thinks it was an atrial tachycardia since it converted.  He does recommend outpatient cards f/u.  Pt wants to go to the [place].  Pt will be put back on coreg and losartan dose decreased to 50 mg.  Pt is to return if worse.  Pilonidal cyst:  pt started on doxy.   Reevaluation:  After the interventions  noted above, I reevaluated the patient and found that they have :improved   Social Determinants of Health:  Lives at  home   Dispostion:  After consideration of the diagnostic results and the patients response to treatment, I feel that the patent would benefit from discharge with outpatient f/u.          Final Clinical Impression(s) / ED Diagnoses Final diagnoses:  Atrial tachycardia  Pilonidal cyst    Rx / DC Orders ED Discharge Orders          Ordered    doxycycline (VIBRAMYCIN) 100 MG capsule  2 times daily        05/24/22 2214    losartan (COZAAR) 50 MG tablet  Daily        05/24/22 2220    carvedilol (COREG) 3.125 MG tablet  2 times daily with meals        05/24/22 2220              Jacalyn Lefevre, MD 05/24/22 2223

## 2022-05-24 NOTE — ED Triage Notes (Signed)
Pt states apple watch told him resting HR was over 120; [facility]; endorses chest heaviness, some sob; denies dizziness; denies cardiac hx

## 2022-05-24 NOTE — ED Notes (Signed)
Date and time results received: 05/24/22 20:33 (use smartphrase ".now" to insert current time)  Test: platelet  Critical Value: 20  Name of Provider Notified: Julie Haviland MD  Orders Received? Or Actions Taken?: Orders Received - See Orders for details 

## 2022-05-24 NOTE — Discharge Instructions (Addendum)
You need to follow up with the cardiologist at the [facility].  Decrease the Losartan down to 50 mg and add Coreg 3.125 mg twice a day.

## 2022-05-28 ENCOUNTER — Ambulatory Visit (INDEPENDENT_AMBULATORY_CARE_PROVIDER_SITE_OTHER): Payer: No Typology Code available for payment source | Admitting: Licensed Clinical Social Worker

## 2022-05-28 ENCOUNTER — Encounter (HOSPITAL_COMMUNITY): Payer: Self-pay | Admitting: Licensed Clinical Social Worker

## 2022-05-28 DIAGNOSIS — F331 Major depressive disorder, recurrent, moderate: Secondary | ICD-10-CM | POA: Diagnosis not present

## 2022-05-28 DIAGNOSIS — F411 Generalized anxiety disorder: Secondary | ICD-10-CM

## 2022-05-28 DIAGNOSIS — F4310 Post-traumatic stress disorder, unspecified: Secondary | ICD-10-CM | POA: Diagnosis not present

## 2022-05-28 NOTE — Progress Notes (Signed)
Virtual Visit via Video Group Note  I connected with Mark [name] on 05/28/22 at 5:00 pm EST by a video enabled telemedicine application and verified that I am speaking with the correct person using two identifiers.   I discussed the limitations of evaluation and management by telemedicine and the availability of in person appointments. The patient expressed understanding and agreed to proceed.  LOCATION: Patient: Home Provider: home Office  History of Present Illness: [facility], [facility]  Treatment Goal Addressed:  Pt will develop the ability to recognize, accept, and cope with feelings of depression AEB self report.  Progress towards Goal:  Progressing    Observation/Objective:       Patient participated in a group discussion on dealing with chronic pain. Clinician used CBT to assist patients in improving their quality of life, activities of daily living, focusing on reducing pain and distress by modifying physical sensation, catastrophic thinking and maladaptive behaviors. Patient was encouraged to use CBT to assist patient in improving quality of life.    Assessment and Plan: Counselor will continue to meet with patient address treatment plan goals. Patient was given a chance for recommendations of providers and implement skills learned in session.    Follow-up instructions:I discussed the assessment and treatment plan with the patient. Patient was provided an opportunity to ask questions and all were answered. The patient agreed with the plan and demonstrated an understanding of the instructions.   The patient was advised to call back or seek an in-person evaluation if the symptoms worsen or if the condition fails to improve as anticipated.  Collaboration of Care: Other: Continue to see providers at [facility]  Patient/Guardian was advised Release of Information must be obtained prior to any record release in order to  collaborate their care with an outside provider. Patient/Guardian was advised if they have not already done so to contact the registration department to sign all necessary forms in order for [name] to release information regarding their care.   Consent: Patient/Guardian gives verbal consent for treatment and assignment of benefits for services provided during this visit. Patient/Guardian expressed understanding and agreed to proceed.      I provided 75 minutes of non-face-to-face time during this encounter.   Minal Stuller S, LCAS 05/28/22

## 2022-05-29 ENCOUNTER — Encounter (HOSPITAL_COMMUNITY): Payer: Self-pay | Admitting: Licensed Clinical Social Worker

## 2022-05-29 NOTE — Progress Notes (Signed)
Virtual Visit via Video Note  I connected with Mark [name] on 05/10/22 at 4:00 EST by video-enabled telemedicine with the correct person using two identifiers.   I discussed the limitations of evaluation and management by telemedicine and the availability of in person appointments. The patient expressed understanding and agreed to proceed.  LOCATION Patient: home Provider: home office    History of Present Illness: Pt was referred to [facility] OP therapy for PTSD, depression and anxiety by the [facility].  Treatment Goal Addressed:   Pt will reduce irritability and increase normal social interaction with family and friends and will develop the ability to recognize, accept, and cope with feelings of depression by using practiced coping skills for depression 1-2x daily AEB self report.  Progress towards Goal:  Progressing    Observations/Objective: Pt presented for today's session on time and was alert, oriented x5, with no evidence or self-report of SI/HI or A/V H.  Patient reported ongoing compliance with medication and denied any use of alcohol or illicit substances.  Clinician inquired about patient's current emotional ratings, as well as any significant changes in thoughts, feelings or behavior since previous session.  Patient reported scores of 4/10 for depression, 7/10 for anxiety, 4/10 for anger/irritability, and denied any reoccurrence of panic attacks.  Pt dentified stressors that occurred in his life over the past week. Cln and pt reviewed each stressor individually and copings skills that may work for his stressors. Cln and pt practiced some effective coping skills. Pt discussed his decision to move back to [place] to be with his family (wife, son and daughter). "They need me to come home because the atmosphere in the house is very toxic. I am the head of the family and it's my responsibility to go home and try to fix the toxicity." Cln asked open ended questions about how moving home  with affect his already fragile mental health.      "I'm overwhelmed, tired, sores are returning on my back, and I stopped taking my meds." Cln asked open ended questions. Cln assisted pt in focusing on the positive by applying his emotional regulation skills and emotion regulation strategies.   Collaboration of Care: Other: Continue seeing providers at [facility]   Patient/Guardian was advised Release of Information must be obtained prior to any record release in order to collaborate their care with an outside provider. Patient/Guardian was advised if they have not already done so to contact the registration department to sign all necessary forms in order for [name] to release information regarding their care.    Consent: Patient/Guardian gives verbal consent for treatment and assignment of benefits for services provided during this visit. Patient/Guardian expressed understanding and agreed to proceed.     Assessment and Plan: Counselor will continue to meet with patient address treatment plan goals. Patient was given a chance for recommendations of providers and implement skills learned in session.   Follow Up Instructions I discussed the assessment and treatment plan with the patient. The patient was provided an opportunity to ask questions and all were answered. The patient agreed with the plan and demonstrated an understanding of the instructions.   The patient was advised to call back or seek an in-person evaluation if the symptoms worsen or if the condition fails to improve as anticipated.  I provided 45 minutes of non-face-to-face time during this encounter.   Jamieson Hetland S, LCAS  .

## 2022-05-31 ENCOUNTER — Ambulatory Visit (HOSPITAL_COMMUNITY): Payer: No Typology Code available for payment source | Admitting: Licensed Clinical Social Worker

## 2022-06-04 ENCOUNTER — Ambulatory Visit (INDEPENDENT_AMBULATORY_CARE_PROVIDER_SITE_OTHER): Payer: No Typology Code available for payment source | Admitting: Licensed Clinical Social Worker

## 2022-06-04 ENCOUNTER — Encounter (HOSPITAL_COMMUNITY): Payer: Self-pay | Admitting: Licensed Clinical Social Worker

## 2022-06-04 DIAGNOSIS — F331 Major depressive disorder, recurrent, moderate: Secondary | ICD-10-CM

## 2022-06-04 DIAGNOSIS — F411 Generalized anxiety disorder: Secondary | ICD-10-CM

## 2022-06-04 DIAGNOSIS — F4310 Post-traumatic stress disorder, unspecified: Secondary | ICD-10-CM | POA: Diagnosis not present

## 2022-06-04 NOTE — Progress Notes (Signed)
Virtual Visit via Video Group Note  I connected with Mark [name] on 06/04/22 at 5:00 pm EST by a video enabled telemedicine application and verified that I am speaking with the correct person using two identifiers.   I discussed the limitations of evaluation and management by telemedicine and the availability of in person appointments. The patient expressed understanding and agreed to proceed.  LOCATION: Patient: Home Provider: home Office  History of Present Illness: [facility], depression, anxiety.  Treatment Goal Addressed:  Pt will develop the ability to recognize, accept, and cope with feelings of depression AEB self report.  Progress towards Goal:  Progressing    Observation/Objective:       Patient participated in a group discussion on upcoming Memorial Day celebrations: honoring and mourning fallen soldiers, putting flags on fellow veteran's graves, attending [employer], rendering respect of fellow soldiers. Clinician honored each veteran in this group for their respective service in the military  Assessment and Plan: Counselor will continue to meet with patient address treatment plan goals. Patient was given a chance for recommendations of providers and implement skills learned in session.    Follow-up instructions:I discussed the assessment and treatment plan with the patient. Patient was provided an opportunity to ask questions and all were answered. The patient agreed with the plan and demonstrated an understanding of the instructions.   The patient was advised to call back or seek an in-person evaluation if the symptoms worsen or if the condition fails to improve as anticipated.  Collaboration of Care: Other: Continue to see providers at [facility]  Patient/Guardian was advised Release of Information must be obtained prior to any record release in order to collaborate their care with an outside provider. Patient/Guardian  was advised if they have not already done so to contact the registration department to sign all necessary forms in order for [name] to release information regarding their care.   Consent: Patient/Guardian gives verbal consent for treatment and assignment of benefits for services provided during this visit. Patient/Guardian expressed understanding and agreed to proceed.      I provided 75 minutes of non-face-to-face time during this encounter.   Travis Purk S, LCAS 06/04/22

## 2022-06-05 ENCOUNTER — Encounter (HOSPITAL_COMMUNITY): Payer: Self-pay | Admitting: Licensed Clinical Social Worker

## 2022-06-05 NOTE — Progress Notes (Signed)
Virtual Visit via Video Note  I connected with Mark [name] on 05/17/22 at 4:00 EST by video-enabled telemedicine with the correct person using two identifiers.   I discussed the limitations of evaluation and management by telemedicine and the availability of in person appointments. The patient expressed understanding and agreed to proceed.  LOCATION Patient: home Provider: home office    History of Present Illness: Pt was referred to [facility] OP therapy for PTSD, depression and anxiety by the [facility].  Treatment Goal Addressed:   Pt will reduce irritability and increase normal social interaction with family and friends and will develop the ability to recognize, accept, and cope with feelings of depression by using practiced coping skills for depression 1-2x daily AEB self report.  Progress towards Goal:  Progressing    Observations/Objective: Pt presented for today's session on time and was alert, oriented x5, with no evidence or self-report of SI/HI or A/V H.  Patient reported ongoing compliance with medication and denied any use of alcohol or illicit substances.  Clinician inquired about patient's current emotional ratings, as well as any significant changes in thoughts, feelings or behavior since previous session.  Patient reported scores of 4/10 for depression, 7/10 for anxiety, 4/10 for anger/irritability, and denied any reoccurrence of panic attacks.  Pt dentified stressors that occurred in his life over the past week. Cln and pt reviewed each stressor individually and copings skills that may work for his stressors. Cln and pt practiced some effective coping skills.  Again, Pt discussed his decision to move back to [place] to be with his family (wife, son and daughter). "They need me to come home because the atmosphere in the house is very toxic. I am the head of the family and it's my responsibility to go home and try to fix the toxicity." Cln asked open ended questions about how  moving home with affect his already fragile mental health and physical health.      Collaboration of Care: Other: Continue seeing providers at [facility]   Patient/Guardian was advised Release of Information must be obtained prior to any record release in order to collaborate their care with an outside provider. Patient/Guardian was advised if they have not already done so to contact the registration department to sign all necessary forms in order for [name] to release information regarding their care.    Consent: Patient/Guardian gives verbal consent for treatment and assignment of benefits for services provided during this visit. Patient/Guardian expressed understanding and agreed to proceed.     Assessment and Plan: Counselor will continue to meet with patient address treatment plan goals. Patient was given a chance for recommendations of providers and implement skills learned in session.   Follow Up Instructions I discussed the assessment and treatment plan with the patient. The patient was provided an opportunity to ask questions and all were answered. The patient agreed with the plan and demonstrated an understanding of the instructions.   The patient was advised to call back or seek an in-person evaluation if the symptoms worsen or if the condition fails to improve as anticipated.  I provided 45 minutes of non-face-to-face time during this encounter.   Davonta Stroot S, LCAS  .

## 2022-06-07 ENCOUNTER — Ambulatory Visit (HOSPITAL_COMMUNITY): Payer: No Typology Code available for payment source | Admitting: Licensed Clinical Social Worker

## 2022-06-07 DIAGNOSIS — F411 Generalized anxiety disorder: Secondary | ICD-10-CM

## 2022-06-07 DIAGNOSIS — F331 Major depressive disorder, recurrent, moderate: Secondary | ICD-10-CM

## 2022-06-07 DIAGNOSIS — F4310 Post-traumatic stress disorder, unspecified: Secondary | ICD-10-CM

## 2022-06-09 NOTE — Progress Notes (Signed)
Virtual Visit via Video Note  I connected with Mark [name] on 05/24/22 at 11:00am EST by video-enabled telemedicine with the correct person using two identifiers.   I discussed the limitations of evaluation and management by telemedicine and the availability of in person appointments. The patient expressed understanding and agreed to proceed.  LOCATION Patient: home Provider: home office    History of Present Illness: Pt was referred to [facility] OP therapy for PTSD, depression and anxiety by the [facility].  Treatment Goal Addressed:   Pt will reduce irritability and increase normal social interaction with family and friends and will develop the ability to recognize, accept, and cope with feelings of depression by using practiced coping skills for depression 1-2x daily AEB self report.  Progress towards Goal:  Progressing    Observations/Objective: Pt presented for today's session on time and was alert, oriented x5, with no evidence or self-report of SI/HI or A/V H.  Patient reported ongoing compliance with medication and denied any use of alcohol or illicit substances.  Clinician inquired about patient's current emotional ratings, as well as any significant changes in thoughts, feelings or behavior since previous session.  Patient reported scores of 5/10 for depression, 8/10 for anxiety, 4/10 for anger/irritability, and denied any reoccurrence of panic attacks.  Pt dentified stressors that occurred in his life over the past week. Cln and pt reviewed each stressor individually and copings skills that may work for his stressors. Cln and pt practiced some effective coping skills in session. Pt reports my anxiety has increased due to his decision to move back to [place] to be with his family (wife, son and daughter). "They need me to come home because the atmosphere in the house is very toxic. I am the head of the family and it's my responsibility to go home and try to fix the toxicity." Cln asked  open ended questions about how moving home with affect his already fragile mental health and physical health. His 2 children are adults and still live in the home. Pt is concerned also when he moves back will he still get the same good care he gets at the [place] [facility] [place]. Asked open ended questions.      Collaboration of Care: Other: Continue seeing providers at [facility]   Patient/Guardian was advised Release of Information must be obtained prior to any record release in order to collaborate their care with an outside provider. Patient/Guardian was advised if they have not already done so to contact the registration department to sign all necessary forms in order for [name] to release information regarding their care.    Consent: Patient/Guardian gives verbal consent for treatment and assignment of benefits for services provided during this visit. Patient/Guardian expressed understanding and agreed to proceed.     Assessment and Plan: Counselor will continue to meet with patient address treatment plan goals. Patient was given a chance for recommendations of providers and implement skills learned in session.   Follow Up Instructions I discussed the assessment and treatment plan with the patient. The patient was provided an opportunity to ask questions and all were answered. The patient agreed with the plan and demonstrated an understanding of the instructions.   The patient was advised to call back or seek an in-person evaluation if the symptoms worsen or if the condition fails to improve as anticipated.  I provided 60 minutes of non-face-to-face time during this encounter.   Keyasia Jolliff S, LCAS  .

## 2022-06-11 ENCOUNTER — Ambulatory Visit (HOSPITAL_COMMUNITY): Payer: No Typology Code available for payment source | Admitting: Licensed Clinical Social Worker

## 2022-06-11 ENCOUNTER — Encounter (HOSPITAL_COMMUNITY): Payer: Self-pay | Admitting: Licensed Clinical Social Worker

## 2022-06-11 DIAGNOSIS — F411 Generalized anxiety disorder: Secondary | ICD-10-CM | POA: Diagnosis not present

## 2022-06-11 DIAGNOSIS — F4310 Post-traumatic stress disorder, unspecified: Secondary | ICD-10-CM

## 2022-06-11 DIAGNOSIS — F331 Major depressive disorder, recurrent, moderate: Secondary | ICD-10-CM | POA: Diagnosis not present

## 2022-06-11 NOTE — Progress Notes (Signed)
Virtual Visit via Video Group Note  I connected with [name] on 06/11/22 at 5:00 pm EST by a video enabled telemedicine application and verified that I am speaking with the correct person using two identifiers.   I discussed the limitations of evaluation and management by telemedicine and the availability of in person appointments. The patient expressed understanding and agreed to proceed.  LOCATION: Patient: Home Provider: home Office  History of Present Illness: [facility]  Treatment Goal Addressed:  Pt will develop the ability to recognize, accept, and cope with feelings of depression AEB self report.  Progress towards Goal:  Progressing    Observation/Objective:       Patient participated in a group discussion on upcoming Memorial Day celebrations: honoring and mourning fallen soldiers, putting flags on fellow veteran's graves, attending [employer], rendering respect of fellow soldiers. Clinician honored each veteran in this group for their respective service in the [employer]. Pt shared what memorial day means to him and his family.  Assessment and Plan: Counselor will continue to meet with patient address treatment plan goals. Patient was given a chance for recommendations of providers and implement skills learned in session.    Follow-up instructions:I discussed the assessment and treatment plan with the patient. Patient was provided an opportunity to ask questions and all were answered. The patient agreed with the plan and demonstrated an understanding of the instructions.   The patient was advised to call back or seek an in-person evaluation if the symptoms worsen or if the condition fails to improve as anticipated.  Collaboration of Care: Other: Continue to see providers at [facility]  Patient/Guardian was advised Release of Information must be obtained prior to any record release in order to  collaborate their care with an outside provider. Patient/Guardian was advised if they have not already done so to contact the registration department to sign all necessary forms in order for [name] to release information regarding their care.   Consent: Patient/Guardian gives verbal consent for treatment and assignment of benefits for services provided during this visit. Patient/Guardian expressed understanding and agreed to proceed.      I provided 75 minutes of non-face-to-face time during this encounter.   Guhan Bruington S, LCAS 06/11/22

## 2022-06-12 ENCOUNTER — Encounter (HOSPITAL_COMMUNITY): Payer: Self-pay | Admitting: Licensed Clinical Social Worker

## 2022-06-12 NOTE — Progress Notes (Incomplete)
Virtual Visit via Video Note  I connected with Mark [name] on 06/07/22 at 11:00am EST by video-enabled telemedicine with the correct person using two identifiers.   I discussed the limitations of evaluation and management by telemedicine and the availability of in person appointments. The patient expressed understanding and agreed to proceed.  LOCATION Patient: home Provider: home office    History of Present Illness: Pt was referred to [facility] OP therapy for PTSD, depression and anxiety by the [facility].  Treatment Goal Addressed:   Pt will reduce irritability and increase normal social interaction with family and friends and will develop the ability to recognize, accept, and cope with feelings of depression by using practiced coping skills for depression 1-2x daily AEB self report.  Progress towards Goal:  Progressing    Observations/Objective: Pt presented for today's session on time and was alert, oriented x5, with no evidence or self-report of SI/HI or A/V H.  Patient reported ongoing compliance with medication and denied any use of alcohol or illicit substances.  Clinician inquired about patient's current emotional ratings, as well as any significant changes in thoughts, feelings or behavior since previous session.  Patient reported scores of 5/10 for depression, 8/10 for anxiety, 4/10 for anger/irritability, and denied any reoccurrence of panic attacks.  Pt dentified stressors that occurred in his life over the past week. Cln and pt reviewed each stressor individually and copings skills that may work for his stressors. Cln and pt practiced some effective coping skills in session. Pt reports "my anxiety continues to increase due to my decision to move back to [place] to be with his family (wife, son and daughter). 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          Collaboration of Care: Other: Continue seeing providers at [facility]   Patient/Guardian was advised Release of Information must be obtained prior to any record release in order to collaborate their care with an outside provider. Patient/Guardian was advised if they have not already done so to contact the registration department to sign all necessary forms in order for [name] to release information regarding their care.    Consent: Patient/Guardian gives verbal consent for treatment and assignment of benefits for services provided during this visit. Patient/Guardian expressed understanding and agreed to proceed.     Assessment and Plan: Counselor will continue to meet with patient address treatment plan goals. Patient was given a chance for recommendations of providers and implement skills learned in session.   Follow Up Instructions I discussed the assessment and treatment plan with the patient. The patient was provided an opportunity to ask questions and all were answered. The patient agreed with the plan and demonstrated an understanding of the instructions.   The patient was advised to call back or seek an in-person evaluation if the symptoms worsen or if the condition fails to improve as anticipated.  I provided 60 minutes of non-face-to-face time during this encounter.   Kelsa Jaworowski S, LCAS  .

## 2022-06-14 ENCOUNTER — Ambulatory Visit (HOSPITAL_COMMUNITY): Payer: No Typology Code available for payment source | Admitting: Licensed Clinical Social Worker

## 2022-06-14 ENCOUNTER — Encounter (HOSPITAL_COMMUNITY): Payer: Self-pay | Admitting: Licensed Clinical Social Worker

## 2022-06-14 DIAGNOSIS — F4310 Post-traumatic stress disorder, unspecified: Secondary | ICD-10-CM

## 2022-06-14 DIAGNOSIS — F411 Generalized anxiety disorder: Secondary | ICD-10-CM

## 2022-06-14 DIAGNOSIS — F331 Major depressive disorder, recurrent, moderate: Secondary | ICD-10-CM

## 2022-06-14 NOTE — Progress Notes (Unsigned)
Virtual Visit via Video Note  I connected with Mark [name] on 06/07/22 at 11:00am EST by video-enabled telemedicine with the correct person using two identifiers.   I discussed the limitations of evaluation and management by telemedicine and the availability of in person appointments. The patient expressed understanding and agreed to proceed.  LOCATION Patient: home Provider: home office    History of Present Illness: Pt was referred to [facility] OP therapy for PTSD, depression and anxiety by the [facility].  Treatment Goal Addressed:   Pt will reduce irritability and increase normal social interaction with family and friends and will develop the ability to recognize, accept, and cope with feelings of depression by using practiced coping skills for depression 1-2x daily AEB self report.  Progress towards Goal:  Progressing    Observations/Objective: Pt presented for today's session on time and was alert, oriented x5, with no evidence or self-report of SI/HI or A/V H.  Patient reported ongoing compliance with medication and denied any use of alcohol or illicit substances.  Clinician inquired about patient's current emotional ratings, as well as any significant changes in thoughts, feelings or behavior since previous session.  Patient reported scores of 5/10 for depression, 8/10 for anxiety, 4/10 for anger/irritability, and denied any reoccurrence of panic attacks.  Pt dentified stressors that occurred in his life over the past week. Cln and pt reviewed each stressor individually and copings skills that may work for his stressors. Cln and pt practiced some effective coping skills in session. Pt reports "my anxiety continues to increase due to my decision to move back to [place] to be with his family (wife, son and daughter). 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          Collaboration of Care: Other: Continue seeing providers at [facility]   Patient/Guardian was advised Release of Information must be obtained prior to any record release in order to collaborate their care with an outside provider. Patient/Guardian was advised if they have not already done so to contact the registration department to sign all necessary forms in order for [name] to release information regarding their care.    Consent: Patient/Guardian gives verbal consent for treatment and assignment of benefits for services provided during this visit. Patient/Guardian expressed understanding and agreed to proceed.     Assessment and Plan: Counselor will continue to meet with patient address treatment plan goals. Patient was given a chance for recommendations of providers and implement skills learned in session.   Follow Up Instructions I discussed the assessment and treatment plan with the patient. The patient was provided an opportunity to ask questions and all were answered. The patient agreed with the plan and demonstrated an understanding of the instructions.   The patient was advised to call back or seek an in-person evaluation if the symptoms worsen or if the condition fails to improve as anticipated.  I provided 45 minutes of non-face-to-face time during this encounter.   Krystiana Fornes S, LCAS  .

## 2022-06-18 ENCOUNTER — Ambulatory Visit (HOSPITAL_COMMUNITY): Payer: No Typology Code available for payment source | Admitting: Licensed Clinical Social Worker

## 2022-06-21 ENCOUNTER — Encounter (HOSPITAL_COMMUNITY): Payer: Self-pay | Admitting: Licensed Clinical Social Worker

## 2022-06-21 ENCOUNTER — Ambulatory Visit (HOSPITAL_COMMUNITY): Payer: No Typology Code available for payment source | Admitting: Licensed Clinical Social Worker

## 2022-06-21 DIAGNOSIS — F331 Major depressive disorder, recurrent, moderate: Secondary | ICD-10-CM

## 2022-06-21 DIAGNOSIS — F411 Generalized anxiety disorder: Secondary | ICD-10-CM | POA: Diagnosis not present

## 2022-06-21 DIAGNOSIS — F4310 Post-traumatic stress disorder, unspecified: Secondary | ICD-10-CM

## 2022-06-21 NOTE — Progress Notes (Signed)
Virtual Visit via Video Note  I connected with Mark [name] on 06/21/22 at 11:00am EST by video-enabled telemedicine with the correct person using two identifiers.   I discussed the limitations of evaluation and management by telemedicine and the availability of in person appointments. The patient expressed understanding and agreed to proceed.  LOCATION Patient: home Provider: home office    History of Present Illness: Pt was referred to [facility] OP therapy for PTSD, depression and anxiety by the [facility].  Treatment Goal Addressed:   Pt will reduce irritability and increase normal social interaction with family and friends and will develop the ability to recognize, accept, and cope with feelings of depression by using practiced coping skills for depression 1-2x daily AEB self report.  Progress towards Goal:  Progressing    Observations/Objective: Pt presented for today's session on time and was alert, oriented x5, with no evidence or self-report of SI/HI or A/V H.  Patient reported ongoing compliance with medication and denied any use of alcohol or illicit substances.  Clinician inquired about patient's current emotional ratings, as well as any significant changes in thoughts, feelings or behavior since previous session.  Patient reported scores of 5/10 for depression, 8/10 for anxiety, 4/10 for anger/irritability, and denied any reoccurrence of panic attacks.  Pt dentified stressors that occurred in his life over the past week. Cln and pt reviewed each stressor individually and copings skills that may work for his stressors. 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 Clinician engaged in discussion on identified positives and  utilized MI, OARS to assess how these have impacted pt's mood. Clinician allowed space for further processing of emotions and provided supportive statements throughout session        Collaboration of Care: Other: Continue seeing providers at [facility]   Patient/Guardian was advised Release of Information must be obtained prior to any record release in order to collaborate their care with an outside provider. Patient/Guardian was advised if they have not already done so to contact the registration department to sign all necessary forms in order for [name] to release information regarding their care.    Consent: Patient/Guardian gives verbal consent for treatment and assignment of benefits for services provided during this visit. Patient/Guardian expressed understanding and agreed to proceed.     Assessment and Plan: Counselor will continue to meet with patient address treatment plan goals. Patient was given a chance for recommendations of providers and implement skills learned in session.   Follow Up Instructions I discussed the assessment and treatment plan with the patient. The patient was provided an opportunity to ask questions and all were answered. The patient agreed with the plan and demonstrated an understanding of the instructions.   The patient was advised to call back or seek an in-person evaluation if the symptoms worsen or if the condition fails to improve as anticipated.  I provided 45 minutes of non-face-to-face time during this encounter.   Cordie Beazley S, LCAS  .

## 2022-06-25 ENCOUNTER — Ambulatory Visit (INDEPENDENT_AMBULATORY_CARE_PROVIDER_SITE_OTHER): Payer: No Typology Code available for payment source | Admitting: Licensed Clinical Social Worker

## 2022-06-25 DIAGNOSIS — F411 Generalized anxiety disorder: Secondary | ICD-10-CM

## 2022-06-25 DIAGNOSIS — F4310 Post-traumatic stress disorder, unspecified: Secondary | ICD-10-CM

## 2022-06-25 DIAGNOSIS — F331 Major depressive disorder, recurrent, moderate: Secondary | ICD-10-CM

## 2022-06-26 ENCOUNTER — Encounter (HOSPITAL_COMMUNITY): Payer: Self-pay | Admitting: Licensed Clinical Social Worker

## 2022-06-26 NOTE — Progress Notes (Signed)
Virtual Visit via Video Group Note  I connected with Mark [name] on 06/25/22 at 5:00 pm EST by a video enabled telemedicine application and verified that I am speaking with the correct person using two identifiers.   I discussed the limitations of evaluation and management by telemedicine and the availability of in person appointments. The patient expressed understanding and agreed to proceed.  LOCATION: Patient: Home Provider: home Office  History of Present Illness: [facility].  Treatment Goal Addressed:  Pt will develop the ability to recognize, accept, and cope with feelings of depression AEB self report.  Progress towards Goal:  Progressing    Observation/Objective:     Pt participated in a group discussion on family dysfunction. Clinician assessed patient's family dynamics and discussed how the dynamics influence mental health symptoms in a negative way. Patient was encouraged to work to improve his family functioning.    Assessment and Plan: Counselor will continue to meet with patient address treatment plan goals. Patient was given a chance for recommendations of providers and implement skills learned in session.    Follow-up instructions:I discussed the assessment and treatment plan with the patient. Patient was provided an opportunity to ask questions and all were answered. The patient agreed with the plan and demonstrated an understanding of the instructions.   The patient was advised to call back or seek an in-person evaluation if the symptoms worsen or if the condition fails to improve as anticipated.  Collaboration of Care: Other: Continue to see providers at [facility]  Patient/Guardian was advised Release of Information must be obtained prior to any record release in order to collaborate their care with an outside provider. Patient/Guardian was advised if they have not already done so to contact the  registration department to sign all necessary forms in order for [name] to release information regarding their care.   Consent: Patient/Guardian gives verbal consent for treatment and assignment of benefits for services provided during this visit. Patient/Guardian expressed understanding and agreed to proceed.      I provided 75 minutes of non-face-to-face time during this encounter.   Alletta Mattos S, LCAS 06/25/22

## 2022-06-28 ENCOUNTER — Ambulatory Visit (HOSPITAL_COMMUNITY): Payer: No Typology Code available for payment source | Admitting: Licensed Clinical Social Worker

## 2022-07-02 ENCOUNTER — Ambulatory Visit (HOSPITAL_COMMUNITY): Payer: No Typology Code available for payment source | Admitting: Licensed Clinical Social Worker

## 2022-07-02 ENCOUNTER — Encounter (HOSPITAL_COMMUNITY): Payer: Self-pay | Admitting: Licensed Clinical Social Worker

## 2022-07-02 DIAGNOSIS — F4310 Post-traumatic stress disorder, unspecified: Secondary | ICD-10-CM | POA: Diagnosis not present

## 2022-07-02 DIAGNOSIS — F331 Major depressive disorder, recurrent, moderate: Secondary | ICD-10-CM | POA: Diagnosis not present

## 2022-07-02 DIAGNOSIS — F411 Generalized anxiety disorder: Secondary | ICD-10-CM | POA: Diagnosis not present

## 2022-07-02 NOTE — Progress Notes (Signed)
Virtual Visit via Video Group Note  I connected with Mark [name] on 07/02/22 at 5:00 pm EST by a video enabled telemedicine application and verified that I am speaking with the correct person using two identifiers.   I discussed the limitations of evaluation and management by telemedicine and the availability of in person appointments. The patient expressed understanding and agreed to proceed.  LOCATION: Patient: Home Provider: home Office  History of Present Illness: [facility], [facility].  Treatment Goal Addressed:  Pt will develop the ability to recognize, accept, and cope with feelings of depression AEB self report.  Progress towards Goal:  Progressing    Observation/Objective:     Patient participated in a group discussion on upcoming Father's Day. It will be bittersweet for some, having lost fathers, not hearing from children, and remembering past Father's Days. Cln encouraged pt to remember the past bittersweet memories.     Assessment and Plan: Counselor will continue to meet with patient address treatment plan goals. Patient was given a chance for recommendations of providers and implement skills learned in session.    Follow-up instructions:I discussed the assessment and treatment plan with the patient. Patient was provided an opportunity to ask questions and all were answered. The patient agreed with the plan and demonstrated an understanding of the instructions.   The patient was advised to call back or seek an in-person evaluation if the symptoms worsen or if the condition fails to improve as anticipated.  Collaboration of Care: Other: Continue to see providers at [facility]  Patient/Guardian was advised Release of Information must be obtained prior to any record release in order to collaborate their care with an outside provider. Patient/Guardian was advised if they have not already done so to contact the  registration department to sign all necessary forms in order for [name] to release information regarding their care.   Consent: Patient/Guardian gives verbal consent for treatment and assignment of benefits for services provided during this visit. Patient/Guardian expressed understanding and agreed to proceed.      I provided 75 minutes of non-face-to-face time during this encounter.   Ellington Greenslade S, LCAS 07/02/22

## 2022-07-03 ENCOUNTER — Telehealth (HOSPITAL_COMMUNITY): Payer: Self-pay | Admitting: Psychiatry

## 2022-07-03 NOTE — Telephone Encounter (Signed)
D:  The [place] [employer] [facility] faxed a letter approving patient to continue seeing Mark [name], LCAS for individual therapy through 06-28-23.

## 2022-07-05 ENCOUNTER — Ambulatory Visit (HOSPITAL_COMMUNITY): Payer: No Typology Code available for payment source | Admitting: Licensed Clinical Social Worker

## 2022-07-09 ENCOUNTER — Ambulatory Visit (HOSPITAL_COMMUNITY): Payer: No Typology Code available for payment source | Admitting: Licensed Clinical Social Worker

## 2022-07-09 ENCOUNTER — Encounter (HOSPITAL_COMMUNITY): Payer: Self-pay | Admitting: Licensed Clinical Social Worker

## 2022-07-09 DIAGNOSIS — F4310 Post-traumatic stress disorder, unspecified: Secondary | ICD-10-CM | POA: Diagnosis not present

## 2022-07-09 DIAGNOSIS — F411 Generalized anxiety disorder: Secondary | ICD-10-CM

## 2022-07-09 DIAGNOSIS — F32A Depression, unspecified: Secondary | ICD-10-CM | POA: Diagnosis not present

## 2022-07-09 DIAGNOSIS — F331 Major depressive disorder, recurrent, moderate: Secondary | ICD-10-CM

## 2022-07-09 NOTE — Progress Notes (Signed)
Virtual Visit via Video Group Note  I connected with Mark [name] on 07/09/22 at 5:00 pm EST by a video enabled telemedicine application and verified that I am speaking with the correct person using two identifiers.   I discussed the limitations of evaluation and management by telemedicine and the availability of in person appointments. The patient expressed understanding and agreed to proceed.  LOCATION: Patient: Home Provider: home Office  History of Present Illness: [facility], [facility]  Treatment Goal Addressed:  Pt will develop the ability to recognize, accept, and cope with feelings of depression AEB self report.  Progress towards Goal:  Progressing    Observation/Objective:     Patient participated in a group discussion on his Father's Day. It will be bittersweet for some, having lost fathers, not hearing from children, and remembering past Father's Days. Cln encouraged pt to remember the past bittersweet memories. Pt reports "My nephew and his sons came and spent the day so it wasn't so bad not being with my own family."    Assessment and Plan: Counselor will continue to meet with patient address treatment plan goals. Patient was given a chance for recommendations of providers and implement skills learned in session.    Follow-up instructions:I discussed the assessment and treatment plan with the patient. Patient was provided an opportunity to ask questions and all were answered. The patient agreed with the plan and demonstrated an understanding of the instructions.   The patient was advised to call back or seek an in-person evaluation if the symptoms worsen or if the condition fails to improve as anticipated.  Collaboration of Care: Other: Continue to see providers at [facility]  Patient/Guardian was advised Release of Information must be obtained prior to any record release in order to collaborate their care with an  outside provider. Patient/Guardian was advised if they have not already done so to contact the registration department to sign all necessary forms in order for [name] to release information regarding their care.   Consent: Patient/Guardian gives verbal consent for treatment and assignment of benefits for services provided during this visit. Patient/Guardian expressed understanding and agreed to proceed.      I provided 75 minutes of non-face-to-face time during this encounter.   Philomene Haff S, LCAS 07/09/22

## 2022-07-12 ENCOUNTER — Ambulatory Visit (HOSPITAL_COMMUNITY): Payer: No Typology Code available for payment source | Admitting: Licensed Clinical Social Worker

## 2022-07-12 ENCOUNTER — Encounter (HOSPITAL_COMMUNITY): Payer: Self-pay | Admitting: Licensed Clinical Social Worker

## 2022-07-12 DIAGNOSIS — F4310 Post-traumatic stress disorder, unspecified: Secondary | ICD-10-CM

## 2022-07-12 DIAGNOSIS — F331 Major depressive disorder, recurrent, moderate: Secondary | ICD-10-CM

## 2022-07-12 DIAGNOSIS — F411 Generalized anxiety disorder: Secondary | ICD-10-CM | POA: Diagnosis not present
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
## 2022-07-16 ENCOUNTER — Encounter (HOSPITAL_COMMUNITY): Payer: Self-pay | Admitting: Licensed Clinical Social Worker

## 2022-07-16 ENCOUNTER — Ambulatory Visit (INDEPENDENT_AMBULATORY_CARE_PROVIDER_SITE_OTHER): Payer: Medicare HMO | Admitting: Licensed Clinical Social Worker

## 2022-07-16 DIAGNOSIS — F331 Major depressive disorder, recurrent, moderate: Secondary | ICD-10-CM | POA: Diagnosis not present

## 2022-07-16 DIAGNOSIS — F411 Generalized anxiety disorder: Secondary | ICD-10-CM | POA: Diagnosis not present

## 2022-07-16 DIAGNOSIS — F4310 Post-traumatic stress disorder, unspecified: Secondary | ICD-10-CM

## 2022-07-16 NOTE — Progress Notes (Signed)
Virtual Visit via Video Group Note  I connected with Mark [name] on 07/16/22 at 5:00 pm EST by a video enabled telemedicine application and verified that I am speaking with the correct person using two identifiers.   I discussed the limitations of evaluation and management by telemedicine and the availability of in person appointments. The patient expressed understanding and agreed to proceed.  LOCATION: Patient: Home Provider: home Office  History of Present Illness: [facility], depression, anxiety.  Treatment Goal Addressed:  Pt will develop the ability to recognize, accept, and cope with feelings of depression AEB self report.  Progress towards Goal:  Progressing    Observation/Objective:     Patient participated in a discussion on believing in yourself, having faith in your own capabilities. When you believe in yourself, you can overcome self-doubt and have the confidence to take action and get things done.  Patient was encouraged to continue believing in himself.   Assessment and Plan: Counselor will continue to meet with patient address treatment plan goals. Patient was given a chance for recommendations of providers and implement skills learned in session.    Follow-up instructions:I discussed the assessment and treatment plan with the patient. Patient was provided an opportunity to ask questions and all were answered. The patient agreed with the plan and demonstrated an understanding of the instructions.   The patient was advised to call back or seek an in-person evaluation if the symptoms worsen or if the condition fails to improve as anticipated.  Collaboration of Care: Other: Continue to see providers at [facility]  Patient/Guardian was advised Release of Information must be obtained prior to any record release in order to collaborate their care with an outside provider. Patient/Guardian was advised if they have not already done  so to contact the registration department to sign all necessary forms in order for [name] to release information regarding their care.   Consent: Patient/Guardian gives verbal consent for treatment and assignment of benefits for services provided during this visit. Patient/Guardian expressed understanding and agreed to proceed.      I provided 90 minutes of non-face-to-face time during this encounter.   Kourtnei Rauber S, LCAS 07/16/22

## 2022-07-19 ENCOUNTER — Ambulatory Visit (HOSPITAL_COMMUNITY): Payer: No Typology Code available for payment source | Admitting: Licensed Clinical Social Worker

## 2022-07-19 DIAGNOSIS — F4310 Post-traumatic stress disorder, unspecified: Secondary | ICD-10-CM | POA: Diagnosis not present

## 2022-07-19 DIAGNOSIS — F411 Generalized anxiety disorder: Secondary | ICD-10-CM | POA: Diagnosis not present

## 2022-07-19 DIAGNOSIS — F331 Major depressive disorder, recurrent, moderate: Secondary | ICD-10-CM | POA: Diagnosis not present

## 2022-07-23 ENCOUNTER — Ambulatory Visit (INDEPENDENT_AMBULATORY_CARE_PROVIDER_SITE_OTHER): Payer: Medicare HMO | Admitting: Licensed Clinical Social Worker

## 2022-07-23 ENCOUNTER — Encounter (HOSPITAL_COMMUNITY): Payer: Self-pay | Admitting: Licensed Clinical Social Worker

## 2022-07-23 DIAGNOSIS — F411 Generalized anxiety disorder: Secondary | ICD-10-CM | POA: Diagnosis not present

## 2022-07-23 DIAGNOSIS — F331 Major depressive disorder, recurrent, moderate: Secondary | ICD-10-CM | POA: Diagnosis not present

## 2022-07-23 DIAGNOSIS — F4310 Post-traumatic stress disorder, unspecified: Secondary | ICD-10-CM | POA: Diagnosis not present
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
## 2022-07-24 ENCOUNTER — Ambulatory Visit (HOSPITAL_COMMUNITY): Payer: No Typology Code available for payment source | Admitting: Licensed Clinical Social Worker

## 2022-07-24 DIAGNOSIS — F411 Generalized anxiety disorder: Secondary | ICD-10-CM | POA: Diagnosis not present

## 2022-07-24 DIAGNOSIS — F4310 Post-traumatic stress disorder, unspecified: Secondary | ICD-10-CM

## 2022-07-24 DIAGNOSIS — F331 Major depressive disorder, recurrent, moderate: Secondary | ICD-10-CM | POA: Diagnosis not present

## 2022-07-30 ENCOUNTER — Ambulatory Visit (HOSPITAL_COMMUNITY): Payer: No Typology Code available for payment source | Admitting: Licensed Clinical Social Worker

## 2022-07-30 DIAGNOSIS — F4310 Post-traumatic stress disorder, unspecified: Secondary | ICD-10-CM

## 2022-07-30 DIAGNOSIS — F411 Generalized anxiety disorder: Secondary | ICD-10-CM | POA: Diagnosis not present

## 2022-07-30 DIAGNOSIS — F331 Major depressive disorder, recurrent, moderate: Secondary | ICD-10-CM

## 2022-07-31 ENCOUNTER — Ambulatory Visit (HOSPITAL_COMMUNITY): Payer: No Typology Code available for payment source | Admitting: Licensed Clinical Social Worker

## 2022-08-02 ENCOUNTER — Ambulatory Visit (HOSPITAL_COMMUNITY): Payer: No Typology Code available for payment source | Admitting: Licensed Clinical Social Worker

## 2022-08-06 ENCOUNTER — Encounter (HOSPITAL_COMMUNITY): Payer: Self-pay | Admitting: Licensed Clinical Social Worker

## 2022-08-06 ENCOUNTER — Ambulatory Visit (HOSPITAL_COMMUNITY): Payer: No Typology Code available for payment source | Admitting: Licensed Clinical Social Worker

## 2022-08-06 DIAGNOSIS — F411 Generalized anxiety disorder: Secondary | ICD-10-CM

## 2022-08-06 DIAGNOSIS — F331 Major depressive disorder, recurrent, moderate: Secondary | ICD-10-CM | POA: Diagnosis not present

## 2022-08-06 DIAGNOSIS — F4310 Post-traumatic stress disorder, unspecified: Secondary | ICD-10-CM

## 2022-08-06 NOTE — Progress Notes (Signed)
Virtual Visit via Video Group Note  I connected with Mark [name] on 07/1522 at 5:00 pm EST by a video enabled telemedicine application and verified that I am speaking with the correct person using two identifiers.   I discussed the limitations of evaluation and management by telemedicine and the availability of in person appointments. The patient expressed understanding and agreed to proceed.  LOCATION: Patient: Home Provider: home Office  History of Present Illness: [facility], [facility]  Treatment Goal Addressed:  Pt will develop the ability to recognize, accept, and cope with feelings of depression AEB self report.  Progress towards Goal:  Progressing    Observation/Objective:   Pt participated in a group discussion on change, which may mean letting go of dysfunctional relationship patterns, irrational beliefs and self-sabotaging behaviors and then replacing them with more positive beliefs and relationship patterns. Patient was encouraged to embrace change.         Assessment and Plan: Counselor will continue to meet with patient address treatment plan goals. Patient was given a chance for recommendations of providers and implement skills learned in session.    Follow-up instructions:I discussed the assessment and treatment plan with the patient. Patient was provided an opportunity to ask questions and all were answered. The patient agreed with the plan and demonstrated an understanding of the instructions.   The patient was advised to call back or seek an in-person evaluation if the symptoms worsen or if the condition fails to improve as anticipated.  Collaboration of Care: Other: Continue to see providers at [facility]  Patient/Guardian was advised Release of Information must be obtained prior to any record release in order to collaborate their care with an outside provider. Patient/Guardian was advised if they have  not already done so to contact the registration department to sign all necessary forms in order for [name] to release information regarding their care.   Consent: Patient/Guardian gives verbal consent for treatment and assignment of benefits for services provided during this visit. Patient/Guardian expressed understanding and agreed to proceed.      I provided 90 minutes of non-face-to-face time during this encounter.   Andre Gallego S, LCAS               08/06/22

## 2022-08-08 ENCOUNTER — Encounter (HOSPITAL_COMMUNITY): Payer: Self-pay | Admitting: Licensed Clinical Social Worker

## 2022-08-08 ENCOUNTER — Ambulatory Visit (INDEPENDENT_AMBULATORY_CARE_PROVIDER_SITE_OTHER): Payer: No Typology Code available for payment source | Admitting: Licensed Clinical Social Worker

## 2022-08-08 DIAGNOSIS — F4310 Post-traumatic stress disorder, unspecified: Secondary | ICD-10-CM

## 2022-08-08 DIAGNOSIS — F331 Major depressive disorder, recurrent, moderate: Secondary | ICD-10-CM | POA: Diagnosis not present

## 2022-08-08 DIAGNOSIS — F411 Generalized anxiety disorder: Secondary | ICD-10-CM

## 2022-08-08 NOTE — Progress Notes (Signed)
Virtual Visit via Video Note  I connected with Mark [name] on 07/19/22 at 11:00am EST by video-enabled telemedicine with the correct person using two identifiers.   I discussed the limitations of evaluation and management by telemedicine and the availability of in person appointments. The patient expressed understanding and agreed to proceed.  LOCATION Patient: home Provider: home office    History of Present Illness: Pt was referred to [facility] OP therapy for PTSD, depression and anxiety by the [facility].  Treatment Goal Addressed:   Pt will reduce irritability and increase normal social interaction with family and friends and will develop the ability to recognize, accept, and cope with feelings of depression by using practiced coping skills for depression 1-2x daily AEB self report.  Progress towards Goal:  Progressing    Observations/Objective: Pt presented for today's session on time and was alert, oriented x5, with no evidence or self-report of SI/HI or A/V H.  Patient reported ongoing compliance with medication and denied any use of alcohol or illicit substances.  Clinician inquired about patient's current emotional ratings, as well as any significant changes in thoughts, feelings or behavior since previous session.  Patient reported scores of 5/10 for depression, 8/10 for anxiety, 4/10 for anger/irritability, and denied any reoccurrence of panic attacks.  Pt identified stressors that occurred in his life over the past week. Cln and pt reviewed each stressor individually and copings skills that may work for his stressors. Pt reports "my anxiety continues to increase due to my decision to move back to [place] to be with my family (wife, son and daughter)." Cln asked open ended questions about possible move: risks vs benefits to his mental health. Cln used CBT to assist in identifying positives to assess how her mood has been impacted. Clinician allowed space for patient to further process  emotions and clinician provided supportive statements throughout session. It was suggested to patient to continue to identify positives that  impact mood daily. Cln provided education on how to to recognize the physical, cognitive, emotional, and behavioral responses that influence his mood.          Collaboration of Care: Other: Continue seeing providers at [facility]   Patient/Guardian was advised Release of Information must be obtained prior to any record release in order to collaborate their care with an outside provider. Patient/Guardian was advised if they have not already done so to contact the registration department to sign all necessary forms in order for [name] to release information regarding their care.    Consent: Patient/Guardian gives verbal consent for treatment and assignment of benefits for services provided during this visit. Patient/Guardian expressed understanding and agreed to proceed.     Assessment and Plan: Counselor will continue to meet with patient address treatment plan goals. Patient was given a chance for recommendations of providers and implement skills learned in session.   Follow Up Instructions I discussed the assessment and treatment plan with the patient. The patient was provided an opportunity to ask questions and all were answered. The patient agreed with the plan and demonstrated an understanding of the instructions.   The patient was advised to call back or seek an in-person evaluation if the symptoms worsen or if the condition fails to improve as anticipated.  I provided 60 minutes of non-face-to-face time during this encounter.   Mckenleigh Tarlton S, LCAS  .

## 2022-08-08 NOTE — Progress Notes (Signed)
Virtual Visit via Video Note  I connected with Mark [name] on 07/24/22 at 12:00pm EST by video-enabled telemedicine with the correct person using two identifiers.   I discussed the limitations of evaluation and management by telemedicine and the availability of in person appointments. The patient expressed understanding and agreed to proceed.  LOCATION Patient: home Provider: home office    History of Present Illness: Pt was referred to [facility] OP therapy for PTSD, depression and anxiety by the [facility].  Treatment Goal Addressed:   Pt will reduce irritability and increase normal social interaction with family and friends and will develop the ability to recognize, accept, and cope with feelings of depression by using practiced coping skills for depression 1-2x daily AEB self report.  Progress towards Goal:  Progressing    Observations/Objective: Pt presented for today's session on time and was alert, oriented x5, with no evidence or self-report of SI/HI or A/V H.  Patient reported ongoing compliance with medication and denied any use of alcohol or illicit substances.  Clinician inquired about patient's current emotional ratings, as well as any significant changes in thoughts, feelings or behavior since previous session.  Patient reported scores of 5/10 for depression, 8/10 for anxiety, 4/10 for anger/irritability, and denied any reoccurrence of panic attacks.  Pt identified stressors that occurred in his life over the past week. Cln and pt reviewed each stressor individually and copings skills that may work for his stressors. Pt practiced in session. Cln engaged Patient in discussion on identified positives to assess how her mood has been impacted. Clinician allowed space for pt to further process emotions and clinician provided supportive statements throughout session.. Cln suggested to patient continue to identify positives that  impact her mood. Pt learned how to recognize the physical,  cognitive, emotional, and behavioral responses.         Collaboration of Care: Other: Continue seeing providers at [place]. Clinician allowed space for pt to further process emotions and clinician provided supportive statements throughout session.. Cln suggested to patient continue to identify positives that  impact her mood. Pt learned how to recognize the physical, cognitive, emotional, and behavioral responses.    Patient/Guardian was advised Release of Information must be obtained prior to any record release in order to collaborate their care with an outside provider. Patient/Guardian was advised if they have not already done so to contact the registration department to sign all necessary forms in order for [name] to release information regarding their care.    Consent: Patient/Guardian gives verbal consent for treatment and assignment of benefits for services provided during this visit. Patient/Guardian expressed understanding and agreed to proceed.     Assessment and Plan: Counselor will continue to meet with patient address treatment plan goals. Patient was given a chance for recommendations of providers and implement skills learned in session.   Follow Up Instructions I discussed the assessment and treatment plan with the patient. The patient was provided an opportunity to ask questions and all were answered. The patient agreed with the plan and demonstrated an understanding of the instructions.   The patient was advised to call back or seek an in-person evaluation if the symptoms worsen or if the condition fails to improve as anticipated.  I provided 60 minutes of non-face-to-face time during this encounter.   Cayli Escajeda S, LCAS  .

## 2022-08-09 ENCOUNTER — Encounter (HOSPITAL_COMMUNITY): Payer: Self-pay | Admitting: Licensed Clinical Social Worker

## 2022-08-09 ENCOUNTER — Ambulatory Visit (HOSPITAL_COMMUNITY): Payer: No Typology Code available for payment source | Admitting: Licensed Clinical Social Worker

## 2022-08-09 NOTE — Progress Notes (Signed)
Virtual Visit via Video Group Note  I connected with Mark [name] on 7/124 at 5:00 pm EST by a video enabled telemedicine application and verified that I am speaking with the correct person using two identifiers.   I discussed the limitations of evaluation and management by telemedicine and the availability of in person appointments. The patient expressed understanding and agreed to proceed.  LOCATION: Patient: Home Provider: home Office  History of Present Illness: [facility].  Treatment Goal Addressed:  Pt will develop the ability to recognize, accept, and cope with feelings of depression AEB self report.  Progress towards Goal:  Progressing    Observation/Objective:    Patient participated in a discussion on the stressors of fireworks during the July 4th celebrations. Patient discussed how the fireworks have been going off in HIS/neighborhood for a few weeks and how it has affected HIS/mental health and PTSD symptoms. Patient was encouraged to use his identified coping skills to assist in the fireworks celebration.         Assessment and Plan: Counselor will continue to meet with patient address treatment plan goals. Patient was given a chance for recommendations of providers and implement skills learned in session.    Follow-up instructions:I discussed the assessment and treatment plan with the patient. Patient was provided an opportunity to ask questions and all were answered. The patient agreed with the plan and demonstrated an understanding of the instructions.   The patient was advised to call back or seek an in-person evaluation if the symptoms worsen or if the condition fails to improve as anticipated.  Collaboration of Care: Other: Continue to see providers at [facility]  Patient/Guardian was advised Release of Information must be obtained prior to any record release in order to collaborate their care  with an outside provider. Patient/Guardian was advised if they have not already done so to contact the registration department to sign all necessary forms in order for [name] to release information regarding their care.   Consent: Patient/Guardian gives verbal consent for treatment and assignment of benefits for services provided during this visit. Patient/Guardian expressed understanding and agreed to proceed.      I provided 90 minutes of non-face-to-face time during this encounter.   Jarelis Ehlert S, LCAS               7/824

## 2022-08-11 ENCOUNTER — Encounter (HOSPITAL_COMMUNITY): Payer: Self-pay | Admitting: Licensed Clinical Social Worker

## 2022-08-11 NOTE — Progress Notes (Signed)
Virtual Visit via Video Note  I connected with Mark [name] on 08/08/22 at 2:00pm EST by video-enabled telemedicine with the correct person using two identifiers.   I discussed the limitations of evaluation and management by telemedicine and the availability of in person appointments. The patient expressed understanding and agreed to proceed.  LOCATION Patient: home Provider: home office    History of Present Illness: Pt was referred to [facility] OP therapy for PTSD, depression and anxiety by the [facility].  Treatment Goal Addressed:   Pt will reduce irritability and increase normal social interaction with family and friends and will develop the ability to recognize, accept, and cope with feelings of depression by using practiced coping skills for depression 1-2x daily AEB self report.  Progress towards Goal:  Progressing    Observations/Objective: Pt presented for today's session on time and was alert, oriented x5, with no evidence or self-report of SI/HI or A/V H.  Patient reported ongoing compliance with medication and denied any use of alcohol or illicit substances.  Clinician inquired about patient's current emotional ratings, as well as any significant changes in thoughts, feelings or behavior since previous session.  Patient reported scores of 5/10 for depression, 8/10 for anxiety, 4/10 for anger/irritability, and denied any reoccurrence of panic attacks.  Pt identified stressors that occurred in his life over the past week. Cln and pt reviewed each stressor individually and copings skills that may work for his stressors.a; packing up house, moving back to [place], moving back to toxic environment, giving away ESA,  financial issues, pain, amputee disability. Pt practiced coping skills in session. Clinician provided thought stopping tools, as well as reality testing to provide pt with tools to think more positive about his choices. Clinician utilized CBT to address thought processes, support and  confidence in his decisions.       Collaboration of Care: Other: Continue seeing providers at [place]. Clinician allowed space for pt to further process emotions and clinician provided supportive statements throughout session.. Cln suggested to patient continue to identify positives that  impact her mood. Pt learned how to recognize the physical, cognitive, emotional, and behavioral responses.    Patient/Guardian was advised Release of Information must be obtained prior to any record release in order to collaborate their care with an outside provider. Patient/Guardian was advised if they have not already done so to contact the registration department to sign all necessary forms in order for [name] to release information regarding their care.    Consent: Patient/Guardian gives verbal consent for treatment and assignment of benefits for services provided during this visit. Patient/Guardian expressed understanding and agreed to proceed.     Assessment and Plan: Counselor will continue to meet with patient address treatment plan goals. Patient was given a chance for recommendations of providers and implement skills learned in session.   Follow Up Instructions I discussed the assessment and treatment plan with the patient. The patient was provided an opportunity to ask questions and all were answered. The patient agreed with the plan and demonstrated an understanding of the instructions.   The patient was advised to call back or seek an in-person evaluation if the symptoms worsen or if the condition fails to improve as anticipated.  I provided 60 minutes of non-face-to-face time during this encounter.   Clevie Prout S, LCAS  .

## 2022-08-13 ENCOUNTER — Encounter (HOSPITAL_COMMUNITY): Payer: Self-pay | Admitting: Licensed Clinical Social Worker

## 2022-08-13 ENCOUNTER — Ambulatory Visit (INDEPENDENT_AMBULATORY_CARE_PROVIDER_SITE_OTHER): Payer: No Typology Code available for payment source | Admitting: Licensed Clinical Social Worker

## 2022-08-13 DIAGNOSIS — F331 Major depressive disorder, recurrent, moderate: Secondary | ICD-10-CM | POA: Diagnosis not present

## 2022-08-13 DIAGNOSIS — F411 Generalized anxiety disorder: Secondary | ICD-10-CM | POA: Diagnosis not present

## 2022-08-13 DIAGNOSIS — F4310 Post-traumatic stress disorder, unspecified: Secondary | ICD-10-CM

## 2022-08-13 NOTE — Progress Notes (Signed)
Virtual Visit via Video Group Note  I connected with Mark [name] on 07/1522 at 5:00 pm EST by a video enabled telemedicine application and verified that I am speaking with the correct person using two identifiers.   I discussed the limitations of evaluation and management by telemedicine and the availability of in person appointments. The patient expressed understanding and agreed to proceed.  LOCATION: Patient: Home Provider: home Office  History of Present Illness: [facility]  Treatment Goal Addressed:  Pt will develop the ability to recognize, accept, and cope with feelings of depression AEB self report.  Progress towards Goal:  Progressing    Observation/Objective:   Patient participated in a discussion on relationships, quantity vs quality, what's important: listening, communicating with openness and honesty, sharing similar values which creates a sense of belonging and bonding. Patient was encouraged to continue to grow within his relationships.  Diagnosis: Anxiety and depression      Assessment and Plan: Counselor will continue to meet with patient address treatment plan goals. Patient was given a chance for recommendations of providers and implement skills learned in session.    Follow-up instructions:I discussed the assessment and treatment plan with the patient. Patient was provided an opportunity to ask questions and all were answered. The patient agreed with the plan and demonstrated an understanding of the instructions.   The patient was advised to call back or seek an in-person evaluation if the symptoms worsen or if the condition fails to improve as anticipated.  Collaboration of Care: Other: Continue to see providers at [facility]  Patient/Guardian was advised Release of Information must be obtained prior to any record release in order to collaborate their care with an outside provider.  Patient/Guardian was advised if they have not already done so to contact the registration department to sign all necessary forms in order for [name] to release information regarding their care.   Consent: Patient/Guardian gives verbal consent for treatment and assignment of benefits for services provided during this visit. Patient/Guardian expressed understanding and agreed to proceed.      I provided 90 minutes of non-face-to-face time during this encounter.   Azana Kiesler S, LCAS               08/13/22

## 2022-08-16 ENCOUNTER — Ambulatory Visit (INDEPENDENT_AMBULATORY_CARE_PROVIDER_SITE_OTHER): Payer: No Typology Code available for payment source | Admitting: Licensed Clinical Social Worker

## 2022-08-16 DIAGNOSIS — F419 Anxiety disorder, unspecified: Secondary | ICD-10-CM

## 2022-08-16 DIAGNOSIS — F411 Generalized anxiety disorder: Secondary | ICD-10-CM

## 2022-08-16 DIAGNOSIS — F32A Depression, unspecified: Secondary | ICD-10-CM

## 2022-08-20 ENCOUNTER — Ambulatory Visit (HOSPITAL_COMMUNITY): Payer: Medicare HMO | Admitting: Licensed Clinical Social Worker

## 2022-08-22 ENCOUNTER — Encounter (HOSPITAL_COMMUNITY): Payer: Self-pay | Admitting: Licensed Clinical Social Worker

## 2022-08-22 NOTE — Progress Notes (Signed)
Virtual Visit via Video Note  I connected with Mark [name] on 08/16/22 at 2:00pm EST by video-enabled telemedicine with the correct person using two identifiers.   I discussed the limitations of evaluation and management by telemedicine and the availability of in person appointments. The patient expressed understanding and agreed to proceed.  LOCATION Patient: home Provider: home office    History of Present Illness: Pt was referred to [facility] OP therapy for PTSD, depression and anxiety by the [facility].  Treatment Goal Addressed:   Pt will reduce irritability and increase normal social interaction with family and friends and will develop the ability to recognize, accept, and cope with feelings of depression by using practiced coping skills for depression 1-2x daily AEB self report.  Progress towards Goal:  Progressing    Observations/Objective: Pt presented for today's session on time and was alert, oriented x5, with no evidence or self-report of SI/HI or A/V H.  Patient reported ongoing compliance with medication and denied any use of alcohol or illicit substances.  Clinician inquired about patient's current emotional ratings, as well as any significant changes in thoughts, feelings or behavior since previous session.  Patient reported scores of 7/10 for depression, 8/10 for anxiety, 4/10 for anger/irritability, and denied any reoccurrence of panic attacks.  Pt identified stressors that occurred in his life over the past week. Cln and pt reviewed each stressor individually and copings skills that may work for his stressors: breathing and meditation.; packing up house, moving back to [place], moving back to toxic environment, giving away ESA,  financial issues, pain, amputee disability. Pt practiced coping skills in session. Clinician provided thought stopping tools, as well as reality testing to provide pt with tools to think more positive about his choices. Clinician utilized CBT to address  thought processes, support and confidence in his decisions.       Collaboration of Care: Other: Continue seeing providers at [place]. Clinician allowed space for pt to further process emotions and clinician provided supportive statements throughout session.. Cln suggested to patient continue to identify positives that  impact her mood. Pt learned how to recognize the physical, cognitive, emotional, and behavioral responses.    Patient/Guardian was advised Release of Information must be obtained prior to any record release in order to collaborate their care with an outside provider. Patient/Guardian was advised if they have not already done so to contact the registration department to sign all necessary forms in order for [name] to release information regarding their care.    Consent: Patient/Guardian gives verbal consent for treatment and assignment of benefits for services provided during this visit. Patient/Guardian expressed understanding and agreed to proceed.     Assessment and Plan: Counselor will continue to meet with patient and address treatment plan goals. Patient was given a chance for recommendations of providers and implement skills learned in session and encouraged to practice coping skills between sessions.  Diagnosis: anxiety and depression  Follow Up Instructions I discussed the assessment and treatment plan with the patient. The patient was provided an opportunity to ask questions and all were answered. The patient agreed with the plan and demonstrated an understanding of the instructions.   The patient was advised to call back or seek an in-person evaluation if the symptoms worsen or if the condition fails to improve as anticipated.  I provided 45 minutes of non-face-to-face time during this encounter.   Warren Lindahl S, LCAS  .

## 2022-08-27 ENCOUNTER — Encounter (HOSPITAL_COMMUNITY): Payer: Self-pay | Admitting: Licensed Clinical Social Worker

## 2022-08-27 ENCOUNTER — Ambulatory Visit (HOSPITAL_COMMUNITY): Payer: No Typology Code available for payment source | Admitting: Licensed Clinical Social Worker

## 2022-08-27 DIAGNOSIS — F4310 Post-traumatic stress disorder, unspecified: Secondary | ICD-10-CM | POA: Diagnosis not present

## 2022-08-27 DIAGNOSIS — F331 Major depressive disorder, recurrent, moderate: Secondary | ICD-10-CM

## 2022-08-27 DIAGNOSIS — F411 Generalized anxiety disorder: Secondary | ICD-10-CM | POA: Diagnosis not present

## 2022-08-27 NOTE — Progress Notes (Addendum)
Virtual Visit via Video Group Note  I connected with Mark [name] on 08/06/22 at 5:00 pm EST by a video enabled telemedicine application and verified that I am speaking with the correct person using two identifiers.   I discussed the limitations of evaluation and management by telemedicine and the availability of in person appointments. The patient expressed understanding and agreed to proceed.  LOCATION: Patient: Home Provider: home Office  History of Present Illness: [facility], [facility], [facility].  Treatment Goal Addressed:  Pt will develop the ability to recognize, accept, and cope with feelings of depression AEB self report.  Progress towards Goal:  Progressing    Observation/Objective:   Patient participated in a group discussion on relationships: communication (listening and communicating with openness and honesty), togetherness (sharing similar values and beliefs and sharing activities (things in common). Patient was encouraged to have open and positive communication, togetherness and similar activities in relationships.    Assessment and Plan: Counselor will continue to meet with patient address treatment plan goals. Patient was given a chance for recommendations of providers and implement skills learned in session practicing coping skills between sessions. Diagnosis: Anxiety and depression    Follow-up instructions:I discussed the assessment and treatment plan with the patient. Patient was provided an opportunity to ask questions and all were answered. The patient agreed with the plan and demonstrated an understanding of the instructions.   The patient was advised to call back or seek an in-person evaluation if the symptoms worsen or if the condition fails to improve as anticipated.  Collaboration of Care: Other: Continue to see providers at [facility]  Patient/Guardian was advised Release of Information must be obtained prior  to any record release in order to collaborate their care with an outside provider. Patient/Guardian was advised if they have not already done so to contact the registration department to sign all necessary forms in order for [name] to release information regarding their care.   Consent: Patient/Guardian gives verbal consent for treatment and assignment of benefits for services provided during this visit. Patient/Guardian expressed understanding and agreed to proceed.      I provided 90 minutes of non-face-to-face time during this encounter.   , S, LCAS               08/27/22

## 2022-08-30 ENCOUNTER — Ambulatory Visit (HOSPITAL_COMMUNITY): Payer: No Typology Code available for payment source | Admitting: Licensed Clinical Social Worker

## 2022-08-30 DIAGNOSIS — F411 Generalized anxiety disorder: Secondary | ICD-10-CM

## 2022-08-30 DIAGNOSIS — F4310 Post-traumatic stress disorder, unspecified: Secondary | ICD-10-CM

## 2022-08-30 DIAGNOSIS — F331 Major depressive disorder, recurrent, moderate: Secondary | ICD-10-CM

## 2022-09-03 ENCOUNTER — Ambulatory Visit (INDEPENDENT_AMBULATORY_CARE_PROVIDER_SITE_OTHER): Payer: No Typology Code available for payment source | Admitting: Licensed Clinical Social Worker

## 2022-09-03 DIAGNOSIS — F4310 Post-traumatic stress disorder, unspecified: Secondary | ICD-10-CM | POA: Diagnosis not present

## 2022-09-03 DIAGNOSIS — F411 Generalized anxiety disorder: Secondary | ICD-10-CM | POA: Diagnosis not present

## 2022-09-03 DIAGNOSIS — F331 Major depressive disorder, recurrent, moderate: Secondary | ICD-10-CM | POA: Diagnosis not present

## 2022-09-05 ENCOUNTER — Encounter (HOSPITAL_COMMUNITY): Payer: Self-pay | Admitting: Licensed Clinical Social Worker

## 2022-09-05 NOTE — Progress Notes (Signed)
Virtual Visit via Video Note  I connected with [name] on 08/30/22 at 11amEST by video-enabled telemedicine with the correct person using two identifiers.   I discussed the limitations of evaluation and management by telemedicine and the availability of in person appointments. The patient expressed understanding and agreed to proceed.  LOCATION Patient: home Provider: home office    History of Present Illness: Pt was referred to [facility] OP therapy for PTSD, depression and anxiety by the [facility].  Treatment Goal Addressed:   Pt will reduce irritability and increase normal social interaction with family and friends and will develop the ability to recognize, accept, and cope with feelings of depression by using practiced coping skills for depression 1-2x daily AEB self report.  Progress towards Goal:  Progressing    Observations/Objective: Pt presented for today's session on time and was alert, oriented x5, with no evidence or self-report of SI/HI or A/V H.  Patient reported ongoing compliance with medication and denied any use of alcohol or illicit substances.  Clinician inquired about patient's current emotional ratings, as well as any significant changes in thoughts, feelings or behavior since previous session.  Patient reported scores of 7/10 for depression, 8/10 for anxiety, 8/10 for anger/irritability, and denied any reoccurrence of panic attacks.  Pt identified stressors that occurred in his life over the past week. Cln and pt reviewed each stressor individually and copings skills that may work for his stressors: breathing and meditation.; packing up house, moving back to [place], moving back to toxic environment, giving away ESA,  financial issues, pain, amputee disability. Pt practiced coping skills in session. Pt reported, "I'm angry and frustrated which has exacerbated my depression because it's raining and I left my cover for wheelchair in [place], so now I have to go out and buy one  so that I can drive to the church's mens retreat." Cln used CBT to assist with pts confidence in his decisions.       Collaboration of Care: Other: Continue seeing providers at [place]. Clinician allowed space for pt to further process emotions and clinician provided supportive statements throughout session.. Cln suggested to patient continue to identify positives that  impact her mood. Pt learned how to recognize the physical, cognitive, emotional, and behavioral responses.    Patient/Guardian was advised Release of Information must be obtained prior to any record release in order to collaborate their care with an outside provider. Patient/Guardian was advised if they have not already done so to contact the registration department to sign all necessary forms in order for [name] to release information regarding their care.    Consent: Patient/Guardian gives verbal consent for treatment and assignment of benefits for services provided during this visit. Patient/Guardian expressed understanding and agreed to proceed.     Assessment and Plan: Counselor will continue to meet with patient and address treatment plan goals. Patient was given a chance for recommendations of providers and implement skills learned in session and encouraged to practice coping skills between sessions, practicing between sessions.Diagnosis: anxiety and depression  Follow Up Instructions I discussed the assessment and treatment plan with the patient. The patient was provided an opportunity to ask questions and all were answered. The patient agreed with the plan and demonstrated an understanding of the instructions.   The patient was advised to call back or seek an in-person evaluation if the symptoms worsen or if the condition fails to improve as anticipated.  I provided 45 minutes of non-face-to-face time during this encounter.  , S, LCAS  .

## 2022-09-06 ENCOUNTER — Ambulatory Visit (HOSPITAL_COMMUNITY): Payer: No Typology Code available for payment source | Admitting: Licensed Clinical Social Worker

## 2022-09-06 ENCOUNTER — Encounter (HOSPITAL_COMMUNITY): Payer: Self-pay | Admitting: Licensed Clinical Social Worker

## 2022-09-06 DIAGNOSIS — F411 Generalized anxiety disorder: Secondary | ICD-10-CM

## 2022-09-06 DIAGNOSIS — F32A Depression, unspecified: Secondary | ICD-10-CM | POA: Diagnosis not present

## 2022-09-06 DIAGNOSIS — F419 Anxiety disorder, unspecified: Secondary | ICD-10-CM

## 2022-09-06 DIAGNOSIS — F4310 Post-traumatic stress disorder, unspecified: Secondary | ICD-10-CM

## 2022-09-06 DIAGNOSIS — F331 Major depressive disorder, recurrent, moderate: Secondary | ICD-10-CM

## 2022-09-06 NOTE — Progress Notes (Signed)
Virtual Visit via Video Note  I connected with Mark [name] on 09/06/22 at 11am EST by video-enabled telemedicine with the correct person using two identifiers.   I discussed the limitations of evaluation and management by telemedicine and the availability of in person appointments. The patient expressed understanding and agreed to proceed.  LOCATION Patient: home Provider: home office    History of Present Illness: Pt was referred to [facility] OP therapy for PTSD, depression and anxiety by the [facility].  Treatment Goal Addressed:   Pt will reduce irritability and increase normal social interaction with family and friends and will develop the ability to recognize, accept, and cope with feelings of depression by using practiced coping skills for depression 1-2x daily AEB self report.  Progress towards Goal:  Progressing    Observations/Objective: Pt presented for today's session on time and was alert, oriented x5, with no evidence or self-report of SI/HI or A/V H.  Patient reported ongoing compliance with medication and denied any use of alcohol or illicit substances.  Clinician inquired about patient's current emotional ratings, as well as any significant changes in thoughts, feelings or behavior since previous session.  Patient reported scores of 8/10 for depression, 8/10 for anxiety, 8/10 for anger/irritability, and denied any reoccurrence of panic attacks.  Pt identified stressors that occurred in his life over the past week. Cln and pt reviewed each stressor individually and copings skills that may work for his stressors: breathing and meditation.; packing up house, moving back to [place], moving back to toxic environment, giving away ESA,  financial issues, pain, amputee disability. Pt practiced coping skills in session. Pt reported, "I continue being angry with my family My son came to town yesterday because he needed my help. He got frustrated and angry and just left town and went back to  [place]. This has really exacerbated my moods (anxiety and depression)."       Collaboration of Care: Other: Continue seeing providers at [place]. Clinician allowed space for pt to further process emotions and clinician provided supportive statements throughout session.. Cln suggested to patient continue to identify positives that  impact her mood. Pt learned how to recognize the physical, cognitive, emotional, and behavioral responses.    Patient/Guardian was advised Release of Information must be obtained prior to any record release in order to collaborate their care with an outside provider. Patient/Guardian was advised if they have not already done so to contact the registration department to sign all necessary forms in order for [name] to release information regarding their care.    Consent: Patient/Guardian gives verbal consent for treatment and assignment of benefits for services provided during this visit. Patient/Guardian expressed understanding and agreed to proceed.     Assessment and Plan: Counselor will continue to meet with patient and address treatment plan goals. Patient was given a chance for recommendations of providers and implement skills learned in session and encouraged to practice coping skills between sessions, practicing between sessions.Diagnosis: anxiety and depression  Follow Up Instructions I discussed the assessment and treatment plan with the patient. The patient was provided an opportunity to ask questions and all were answered. The patient agreed with the plan and demonstrated an understanding of the instructions.   The patient was advised to call back or seek an in-person evaluation if the symptoms worsen or if the condition fails to improve as anticipated.  I provided 45 minutes of non-face-to-face time during this encounter.   , S, LCAS  .

## 2022-09-09 ENCOUNTER — Encounter (HOSPITAL_COMMUNITY): Payer: Self-pay | Admitting: Licensed Clinical Social Worker

## 2022-09-09 NOTE — Progress Notes (Signed)
Virtual Visit via Video Group Note  I connected with Mark [name] on 09/03/22 at 5:00 pm EST by a video enabled telemedicine application and verified that I am speaking with the correct person using two identifiers.   I discussed the limitations of evaluation and management by telemedicine and the availability of in person appointments. The patient expressed understanding and agreed to proceed.  LOCATION: Patient: Home Provider: home Office  History of Present Illness: [facility], [facility], [facility].  Treatment Goal Addressed:  Pt will develop the ability to recognize, accept, and cope with feelings of depression AEB self report.  Progress towards Goal:  Progressing    Observation/Objective:   Patient participated in a group discussion on believing in yourself, having faith in your own capabilities. Cln disucssed  believing in self, you can overcome self-doubt and have the confidence to take action and get things done.  Patient was encouraged to continue believing in himself."Having self-doubt increases my ptsd symptoms, anxiety and depression.   Assessment and Plan: Counselor will continue to meet with patient address treatment plan goals. Patient was given a chance for recommendations of providers and implement skills learned in session practicing coping skills between sessions. Diagnosis: Anxiety and depression and ptsd    Follow-up instructions:I discussed the assessment and treatment plan with the patient. Patient was provided an opportunity to ask questions and all were answered. The patient agreed with the plan and demonstrated an understanding of the instructions.   The patient was advised to call back or seek an in-person evaluation if the symptoms worsen or if the condition fails to improve as anticipated.  Collaboration of Care: Other: Continue to see providers at [facility]  Patient/Guardian was advised Release of Information  must be obtained prior to any record release in order to collaborate their care with an outside provider. Patient/Guardian was advised if they have not already done so to contact the registration department to sign all necessary forms in order for [name] to release information regarding their care.   Consent: Patient/Guardian gives verbal consent for treatment and assignment of benefits for services provided during this visit. Patient/Guardian expressed understanding and agreed to proceed.      I provided 90 minutes of non-face-to-face time during this encounter.   Mark [name] S, LCAS               09/03/22

## 2022-09-10 ENCOUNTER — Ambulatory Visit (INDEPENDENT_AMBULATORY_CARE_PROVIDER_SITE_OTHER): Payer: No Typology Code available for payment source | Admitting: Licensed Clinical Social Worker

## 2022-09-10 DIAGNOSIS — F4310 Post-traumatic stress disorder, unspecified: Secondary | ICD-10-CM | POA: Diagnosis not present

## 2022-09-10 DIAGNOSIS — F411 Generalized anxiety disorder: Secondary | ICD-10-CM

## 2022-09-10 DIAGNOSIS — F331 Major depressive disorder, recurrent, moderate: Secondary | ICD-10-CM

## 2022-09-11 ENCOUNTER — Encounter (HOSPITAL_COMMUNITY): Payer: Self-pay | Admitting: Licensed Clinical Social Worker

## 2022-09-11 NOTE — Progress Notes (Signed)
Virtual Visit via Video Group Note  I connected with Mark [name] on 09/03/22 at 5:00 pm EST by a video enabled telemedicine application and verified that I am speaking with the correct person using two identifiers.   I discussed the limitations of evaluation and management by telemedicine and the availability of in person appointments. The patient expressed understanding and agreed to proceed.  LOCATION: Patient: Home Provider: home Office  History of Present Illness: [facility], [facility].  Treatment Goal Addressed:  Pt will develop the ability to recognize, accept, and cope with feelings of depression AEB self report.  Progress towards Goal:  Progressing    Observation/Objective:   Patient participated in a group discussion on emotional regulation skills, helping patient learn to manage his feelings and to better cope with situations, which exacerbate  my depressive, anxious, and trauma symptoms.  Patient was encouraged to apply emotional regulation skills and focus on the positive.      Assessment and Plan: Counselor will continue to meet with patient address treatment plan goals. Patient was given a chance for recommendations of providers and implement skills learned in session practicing coping skills between sessions. Diagnosis: Anxiety and depression and ptsd    Follow-up instructions:I discussed the assessment and treatment plan with the patient. Patient was provided an opportunity to ask questions and all were answered. The patient agreed with the plan and demonstrated an understanding of the instructions.   The patient was advised to call back or seek an in-person evaluation if the symptoms worsen or if the condition fails to improve as anticipated.  Collaboration of Care: Other: Continue to see providers at [facility]  Patient/Guardian was advised Release of Information must be obtained prior to any record release  in order to collaborate their care with an outside provider. Patient/Guardian was advised if they have not already done so to contact the registration department to sign all necessary forms in order for [name] to release information regarding their care.   Consent: Patient/Guardian gives verbal consent for treatment and assignment of benefits for services provided during this visit. Patient/Guardian expressed understanding and agreed to proceed.      I provided 90 minutes of non-face-to-face time during this encounter.   Amazin Pincock S, LCAS               09/10/22

## 2022-09-13 ENCOUNTER — Ambulatory Visit (HOSPITAL_COMMUNITY): Payer: No Typology Code available for payment source | Admitting: Licensed Clinical Social Worker

## 2022-09-13 ENCOUNTER — Encounter (HOSPITAL_COMMUNITY): Payer: Self-pay | Admitting: Licensed Clinical Social Worker

## 2022-09-13 DIAGNOSIS — F331 Major depressive disorder, recurrent, moderate: Secondary | ICD-10-CM | POA: Diagnosis not present

## 2022-09-13 DIAGNOSIS — F411 Generalized anxiety disorder: Secondary | ICD-10-CM | POA: Diagnosis not present

## 2022-09-13 DIAGNOSIS — F4310 Post-traumatic stress disorder, unspecified: Secondary | ICD-10-CM

## 2022-09-13 NOTE — Progress Notes (Signed)
Virtual Visit via Video Note  I connected with Mark [name] on 09/13/22 at 11am EST by video-enabled telemedicine with the correct person using two identifiers.   I discussed the limitations of evaluation and management by telemedicine and the availability of in person appointments. The patient expressed understanding and agreed to proceed.  LOCATION Patient: home Provider: home office    History of Present Illness: Pt was referred to [facility] OP therapy for PTSD, depression and anxiety by the [facility]  Treatment Goal Addressed:   Pt will reduce irritability and increase normal social interaction with family and friends and will develop the ability to recognize, accept, and cope with feelings of depression and anxiety by using practiced coping skills for depression 1-2x daily AEB self report.  Progress towards Goal:  Progressing    Observations/Objective: Pt presented for today's session on time and was alert, oriented x5, with no evidence or self-report of SI/HI or A/V H.  Patient reported ongoing compliance with medication and denied any use of alcohol or illicit substances.  Clinician inquired about patient's current emotional ratings, as well as any significant changes in thoughts, feelings or behavior since previous session.  Patient reported scores of 8/10 for depression, 8/10 for anxiety, 8/10 for anger/irritability, and denied any reoccurrence of panic attacks.  Pt identified stressors that occurred in his life over the past week. Cln and pt reviewed each stressor individually and copings skills that may work for his stressors: grounding and breathing exercises.; packing up house, moving back to [place], moving back to toxic environment, giving away ESA,  financial issues, pain, amputee disability. Pt practiced coping skills in session. Cln used CBT to assist in identifying positives or negatives to assess how his mood has been impacted. Clinician allowed space for patient to further process  emotions and clinician provided supportive statements throughout session. It was suggested to patient to continue to identify positives, which will hopefully be more than negatives that  impact mood daily. Cln provided education on how to to recognize the physical, cognitive, emotional, and behavioral responses that influence his mood, which makes his anxiety and depression worse or better.           Collaboration of Care: Other: Continue seeing providers at [place]. Clinician allowed space for pt to further process emotions and clinician provided supportive statements throughout session.. Cln suggested to patient continue to identify positives that  impact her mood. Pt learned how to recognize the physical, cognitive, emotional, and behavioral responses.    Patient/Guardian was advised Release of Information must be obtained prior to any record release in order to collaborate their care with an outside provider. Patient/Guardian was advised if they have not already done so to contact the registration department to sign all necessary forms in order for [name] to release information regarding their care.    Consent: Patient/Guardian gives verbal consent for treatment and assignment of benefits for services provided during this visit. Patient/Guardian expressed understanding and agreed to proceed.     Assessment and Plan: Counselor will continue to meet with patient and address treatment plan goals. Patient was given a chance for recommendations of providers and implement skills learned in session and encouraged to practice coping skills between sessions, practicing between sessions.Diagnosis: anxiety and depression  Follow Up Instructions I discussed the assessment and treatment plan with the patient. The patient was provided an opportunity to ask questions and all were answered. The patient agreed with the plan and demonstrated an understanding of the instructions.  The patient was advised to call  back or seek an in-person evaluation if the symptoms worsen or if the condition fails to improve as anticipated.  I provided 60 minutes of non-face-to-face time during this encounter.   Dalayla Aldredge S, LCAS  .

## 2022-09-17 ENCOUNTER — Ambulatory Visit (INDEPENDENT_AMBULATORY_CARE_PROVIDER_SITE_OTHER): Payer: No Typology Code available for payment source | Admitting: Licensed Clinical Social Worker

## 2022-09-17 ENCOUNTER — Encounter (HOSPITAL_COMMUNITY): Payer: Self-pay | Admitting: Licensed Clinical Social Worker

## 2022-09-17 DIAGNOSIS — F4310 Post-traumatic stress disorder, unspecified: Secondary | ICD-10-CM

## 2022-09-17 DIAGNOSIS — F331 Major depressive disorder, recurrent, moderate: Secondary | ICD-10-CM

## 2022-09-17 DIAGNOSIS — F411 Generalized anxiety disorder: Secondary | ICD-10-CM | POA: Diagnosis not present

## 2022-09-17 NOTE — Progress Notes (Signed)
Virtual Visit via Video Group Note  I connected with Mark [name] on 09/17/22 at 5:00 pm EST by a video enabled telemedicine application and verified that I am speaking with the correct person using two identifiers.   I discussed the limitations of evaluation and management by telemedicine and the availability of in person appointments. The patient expressed understanding and agreed to proceed.  LOCATION: Patient: Home Provider: home Office  History of Present Illness: [facility], [facility], [facility].  Treatment Goal Addressed:  Pt will develop the ability to recognize, accept, and cope with feelings of depression AEB self report.  Progress towards Goal:  Progressing    Observation/Objective:   Pt participated in a group discussion on current stressors. Pt identified his current stressors: family, moving back to [place], job, which led the discussion to manageable coping skills. Pt was encouraged to continue using coping techniques to deal with current stressors.    Assessment and Plan: Counselor will continue to meet with patient address treatment plan goals. Patient was given a chance for recommendations of providers and implement skills learned in session practicing coping skills between sessions. Diagnosis: Anxiety and depression and ptsd    Follow-up instructions:I discussed the assessment and treatment plan with the patient. Patient was provided an opportunity to ask questions and all were answered. The patient agreed with the plan and demonstrated an understanding of the instructions.   The patient was advised to call back or seek an in-person evaluation if the symptoms worsen or if the condition fails to improve as anticipated.  Collaboration of Care: Other: Continue to see providers at [facility]  Patient/Guardian was advised Release of Information must be obtained prior to any record release in order to collaborate their care  with an outside provider. Patient/Guardian was advised if they have not already done so to contact the registration department to sign all necessary forms in order for [name] to release information regarding their care.   Consent: Patient/Guardian gives verbal consent for treatment and assignment of benefits for services provided during this visit. Patient/Guardian expressed understanding and agreed to proceed.      I provided 90 minutes of non-face-to-face time during this encounter.   Mark [name], LCAS               09/17/22

## 2022-09-20 ENCOUNTER — Ambulatory Visit (INDEPENDENT_AMBULATORY_CARE_PROVIDER_SITE_OTHER): Payer: No Typology Code available for payment source | Admitting: Licensed Clinical Social Worker

## 2022-09-20 DIAGNOSIS — F331 Major depressive disorder, recurrent, moderate: Secondary | ICD-10-CM

## 2022-09-20 DIAGNOSIS — F4310 Post-traumatic stress disorder, unspecified: Secondary | ICD-10-CM

## 2022-09-20 DIAGNOSIS — F411 Generalized anxiety disorder: Secondary | ICD-10-CM

## 2022-09-20 DIAGNOSIS — H524 Presbyopia: Secondary | ICD-10-CM | POA: Diagnosis not present

## 2022-09-20 DIAGNOSIS — H52223 Regular astigmatism, bilateral: Secondary | ICD-10-CM | POA: Diagnosis not present

## 2022-09-24 ENCOUNTER — Ambulatory Visit (HOSPITAL_COMMUNITY): Payer: Medicare HMO | Admitting: Licensed Clinical Social Worker

## 2022-09-27 ENCOUNTER — Ambulatory Visit (INDEPENDENT_AMBULATORY_CARE_PROVIDER_SITE_OTHER): Payer: No Typology Code available for payment source | Admitting: Licensed Clinical Social Worker

## 2022-09-27 DIAGNOSIS — F331 Major depressive disorder, recurrent, moderate: Secondary | ICD-10-CM | POA: Diagnosis not present

## 2022-09-27 DIAGNOSIS — F411 Generalized anxiety disorder: Secondary | ICD-10-CM

## 2022-09-27 DIAGNOSIS — F4310 Post-traumatic stress disorder, unspecified: Secondary | ICD-10-CM

## 2022-09-30 ENCOUNTER — Encounter (HOSPITAL_COMMUNITY): Payer: Self-pay | Admitting: Licensed Clinical Social Worker

## 2022-09-30 NOTE — Progress Notes (Signed)
Virtual Visit via Video Note  I connected with Mark [name] on 09/20/22 at 11am EST by video-enabled telemedicine with the correct person using two identifiers.   I discussed the limitations of evaluation and management by telemedicine and the availability of in person appointments. The patient expressed understanding and agreed to proceed.  LOCATION Patient: home Provider: home office    History of Present Illness: Pt was referred to [facility] OP therapy for PTSD, depression and anxiety by the [facility].  Treatment Goal Addressed:   Pt will reduce irritability and increase normal social interaction with family and friends and will develop the ability to recognize, accept, and cope with feelings of depression and anxiety by using practiced coping skills for depression 1-2x daily AEB self report.  Progress towards Goal:  Progressing    Observations/Objective: Pt presented for today's session on time and was alert, oriented x5, with no evidence or self-report of SI/HI or A/V H.  Patient reported ongoing compliance with medication and denied any use of alcohol or illicit substances.  Clinician inquired about patient's current emotional ratings, as well as any significant changes in thoughts, feelings or behavior since previous session.  Patient reported scores of 8/10 for depression, 8/10 for anxiety, 8/10 for anger/irritability, and denied any reoccurrence of panic attacks.  Pt identified stressors that occurred in his life over the past week. Cln and pt reviewed each stressor individually and copings skills used over the past week:: grounding and breathing exercises.; packing up house, [place] back to [place], moving back to toxic environment, giving away ESA,  financial issues, pain, amputee disability. Pt practiced coping skills in session. Cln and pt discussed the benefits of using mindfulness-based stress response to assist patient in lowering his stress response due to the numerous continued  stressors. Patient was encouraged to use mindfulness skills as a coping skill to assist in lowering stress response, which increases his anxiety and depression as well as trauma based stressors.          Collaboration of Care: Other: Continue seeing providers at [place].    Patient/Guardian was advised Release of Information must be obtained prior to any record release in order to collaborate their care with an outside provider. Patient/Guardian was advised if they have not already done so to contact the registration department to sign all necessary forms in order for [name] to release information regarding their care.    Consent: Patient/Guardian gives verbal consent for treatment and assignment of benefits for services provided during this visit. Patient/Guardian expressed understanding and agreed to proceed.     Assessment and Plan: Counselor will continue to meet with patient and address treatment plan goals. Patient was given a chance for recommendations of providers and implement skills learned in session and encouraged to practice coping skills between sessions, practicing between sessions.Diagnosis: anxiety and depression and ptsd  Follow Up Instructions I discussed the assessment and treatment plan with the patient. The patient was provided an opportunity to ask questions and all were answered. The patient agreed with the plan and demonstrated an understanding of the instructions.   The patient was advised to call back or seek an in-person evaluation if the symptoms worsen or if the condition fails to improve as anticipated.  I provided 45 minutes of non-face-to-face time during this encounter.   Lorry Anastasi S, LCAS  .

## 2022-10-01 ENCOUNTER — Ambulatory Visit (INDEPENDENT_AMBULATORY_CARE_PROVIDER_SITE_OTHER): Payer: No Typology Code available for payment source | Admitting: Licensed Clinical Social Worker

## 2022-10-01 ENCOUNTER — Encounter (HOSPITAL_COMMUNITY): Payer: Self-pay | Admitting: Licensed Clinical Social Worker

## 2022-10-01 DIAGNOSIS — F331 Major depressive disorder, recurrent, moderate: Secondary | ICD-10-CM

## 2022-10-01 DIAGNOSIS — F4310 Post-traumatic stress disorder, unspecified: Secondary | ICD-10-CM | POA: Diagnosis not present

## 2022-10-01 DIAGNOSIS — F411 Generalized anxiety disorder: Secondary | ICD-10-CM

## 2022-10-01 NOTE — Progress Notes (Signed)
Virtual Visit via Video Group Note  I connected with Mark [name] on 10/01/22 at 5:00 pm EST by a video enabled telemedicine application and verified that I am speaking with the correct person using two identifiers.   I discussed the limitations of evaluation and management by telemedicine and the availability of in person appointments. The patient expressed understanding and agreed to proceed.  LOCATION: Patient: Home Provider: home Office  History of Present Illness: [facility]  Treatment Goal Addressed:  Pt will develop the ability to recognize, accept, and cope with feelings of depression AEB self report.  Progress towards Goal:  Progressing    Observation/Objective:   Patient participated in a discussion about the anniversary of 9/11 and how it affected veteran. [name] shared his feelings about the anniversary, how it affected him at the time and how it affects him today. 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 Cln thanks veteran for his service.  Assessment and Plan: Counselor will continue to meet with patient address treatment plan goals. Patient was given a chance for recommendations of providers and implement skills learned in session practicing coping skills between sessions. Diagnosis: Anxiety and depression and ptsd    Follow-up instructions:I discussed the assessment and treatment plan with the patient. Patient was provided an opportunity to ask questions and all were answered. The patient agreed with the plan and demonstrated an understanding of the instructions.   The patient was advised to call back or seek an in-person evaluation if the symptoms worsen or if the condition fails to improve as anticipated.  Collaboration of Care: Other: Continue to see providers at [facility]  Patient/Guardian was advised Release of Information must be obtained prior to any record release in  order to collaborate their care with an outside provider. Patient/Guardian was advised if they have not already done so to contact the registration department to sign all necessary forms in order for [name] to release information regarding their care.   Consent: Patient/Guardian gives verbal consent for treatment and assignment of benefits for services provided during this visit. Patient/Guardian expressed understanding and agreed to proceed.      I provided 90 minutes of non-face-to-face time during this encounter.   Athony Coppa S, LCAS               10/01/22

## 2022-10-04 ENCOUNTER — Encounter (HOSPITAL_COMMUNITY): Payer: Self-pay | Admitting: Licensed Clinical Social Worker

## 2022-10-04 ENCOUNTER — Ambulatory Visit (INDEPENDENT_AMBULATORY_CARE_PROVIDER_SITE_OTHER): Payer: No Typology Code available for payment source | Admitting: Licensed Clinical Social Worker

## 2022-10-04 DIAGNOSIS — F4310 Post-traumatic stress disorder, unspecified: Secondary | ICD-10-CM

## 2022-10-04 DIAGNOSIS — F331 Major depressive disorder, recurrent, moderate: Secondary | ICD-10-CM | POA: Diagnosis not present

## 2022-10-04 DIAGNOSIS — F411 Generalized anxiety disorder: Secondary | ICD-10-CM | POA: Diagnosis not present

## 2022-10-04 NOTE — Progress Notes (Signed)
Virtual Visit via Video Note  I connected with Mark [name] on 10/04/22 at 11am-11:45am EST by video-enabled telemedicine with the correct person using two identifiers.   I discussed the limitations of evaluation and management by telemedicine and the availability of in person appointments. The patient expressed understanding and agreed to proceed.  LOCATION Patient: home Provider: home office    History of Present Illness: Pt was referred to [facility] OP therapy for PTSD, depression and anxiety by the [facility].  Treatment Goal Addressed:   Pt will reduce irritability and increase normal social interaction with family and friends and will develop the ability to recognize, accept, and cope with feelings of depression and anxiety by using practiced coping skills for depression 1-2x daily AEB self report.  Progress towards Goal:  Progressing    Observations/Objective: Pt presented for today's session on time and was alert, oriented x5, with no evidence or self-report of SI/HI or A/V H.  Patient reported ongoing compliance with medication and denied any use of alcohol or illicit substances.  Clinician inquired about patient's current emotional ratings, as well as any significant changes in thoughts, feelings or behavior since previous session.  Patient reported scores of 6/10 for depression, 8/10 for anxiety, 8/10 for anger/irritability, and denied any reoccurrence of panic attacks.  Pt identified stressors that occurred in his life over the past week. Cln and pt reviewed each stressor individually and copings skills used over the past week: grounding and breathing exercises.; packing up house, moving back to [place], moving back to toxic environment, giving away ESA,  financial issues, pain, amputee disability. Pt practiced coping skills in session. Pt has begun having blisters on his backside once again. Cln asked open ended questions. "[facility], but it hurts so much I  bend to the side most of the time because of the pain. Clinician utilized CBT to process thoughts, feelings, and behaviors. Clinician noted with pt the challenge in being productive, working, and feeling positive when physical health is poor. Clinician provided supportive feedback.    Collaboration of Care: Other: Continue seeing providers at [place].    Patient/Guardian was advised Release of Information must be obtained prior to any record release in order to collaborate their care with an outside provider. Patient/Guardian was advised if they have not already done so to contact the registration department to sign all necessary forms in order for [name] to release information regarding their care.    Consent: Patient/Guardian gives verbal consent for treatment and assignment of benefits for services provided during this visit. Patient/Guardian expressed understanding and agreed to proceed.     Assessment and Plan: Counselor will continue to meet with patient and address treatment plan goals. Patient was given a chance for recommendations of providers and implement skills learned in session and encouraged to practice coping skills between sessions, practicing between sessions.Diagnosis: anxiety and depression and ptsd  Follow Up Instructions I discussed the assessment and treatment plan with the patient. The patient was provided an opportunity to ask questions and all were answered. The patient agreed with the plan and demonstrated an understanding of the instructions.   The patient was advised to call back or seek an in-person evaluation if the symptoms worsen or if the condition fails to improve as anticipated.  I provided 45 minutes of non-face-to-face time during this encounter.   Leemon Ayala S, LCAS  .

## 2022-10-07 ENCOUNTER — Encounter (HOSPITAL_COMMUNITY): Payer: Self-pay | Admitting: Licensed Clinical Social Worker

## 2022-10-07 NOTE — Progress Notes (Signed)
Virtual Visit via Video Note  I connected with Mark [name] on 09/27/22 at 11-11:45am EST by video-enabled telemedicine with the correct person using two identifiers.   I discussed the limitations of evaluation and management by telemedicine and the availability of in person appointments. The patient expressed understanding and agreed to proceed.  LOCATION Patient: home Provider: home office    History of Present Illness: Pt was referred to [facility] OP therapy for PTSD, depression and anxiety by the [facility].  Treatment Goal Addressed:   Pt will reduce irritability and increase normal social interaction with family and friends and will develop the ability to recognize, accept, and cope with feelings of depression and anxiety by using practiced coping skills for depression 1-2x daily AEB self report.  Progress towards Goal:  Progressing    Observations/Objective: Pt presented for today's session on time and was alert, oriented x5, with no evidence or self-report of SI/HI or A/V H.  Patient reported ongoing compliance with medication and denied any use of alcohol or illicit substances.  Clinician inquired about patient's current emotional ratings, as well as any significant changes in thoughts, feelings or behavior since previous session. Patient reported scores of 8/10 for depression, 8/10 for anxiety, 8/10 for anger/irritability, and denied any reoccurrence of panic attacks.  Pt identified stressors that occurred in his life over the past week. Cln and pt reviewed each stressor individually and copings skills used over the past week:: grounding and breathing exercises.; packing up house, moving back to [place], moving back to toxic environment, giving away ESA,  financial issues, pain, amputee disability. Pt practiced coping skills in session.  Cln and pt explored negative cycle of anxiety including ruminative thoughts and avoidant behaviors and discussed negative (longer-term) consequences of  avoidance. skill. Majority of skill practice was focused on identifying specific stressors and brainstorming  options/alternatives.    Collaboration of Care: Other: Continue seeing providers at [place].    Patient/Guardian was advised Release of Information must be obtained prior to any record release in order to collaborate their care with an outside provider. Patient/Guardian was advised if they have not already done so to contact the registration department to sign all necessary forms in order for [name] to release information regarding their care.    Consent: Patient/Guardian gives verbal consent for treatment and assignment of benefits for services provided during this visit. Patient/Guardian expressed understanding and agreed to proceed.     Assessment and Plan: Counselor will continue to meet with patient and address treatment plan goals. Patient was given a chance for recommendations of providers and implement skills learned in session and encouraged to practice coping skills between sessions, practicing between sessions.Diagnosis: anxiety and depression and ptsd  Follow Up Instructions I discussed the assessment and treatment plan with the patient. The patient was provided an opportunity to ask questions and all were answered. The patient agreed with the plan and demonstrated an understanding of the instructions.   The patient was advised to call back or seek an in-person evaluation if the symptoms worsen or if the condition fails to improve as anticipated.  I provided 45 minutes of non-face-to-face time during this encounter.   Osbaldo Mark S, LCAS  .

## 2022-10-08 ENCOUNTER — Encounter (HOSPITAL_COMMUNITY): Payer: Self-pay | Admitting: Licensed Clinical Social Worker

## 2022-10-08 ENCOUNTER — Ambulatory Visit (INDEPENDENT_AMBULATORY_CARE_PROVIDER_SITE_OTHER): Payer: No Typology Code available for payment source | Admitting: Licensed Clinical Social Worker

## 2022-10-08 DIAGNOSIS — F411 Generalized anxiety disorder: Secondary | ICD-10-CM | POA: Diagnosis not present

## 2022-10-08 DIAGNOSIS — F4310 Post-traumatic stress disorder, unspecified: Secondary | ICD-10-CM

## 2022-10-08 DIAGNOSIS — F331 Major depressive disorder, recurrent, moderate: Secondary | ICD-10-CM | POA: Diagnosis not present

## 2022-10-08 NOTE — Progress Notes (Signed)
Virtual Visit via Video Group Note  I connected with Mark [name] on 10/08/22 at 5:00 pm EST by a video enabled telemedicine application and verified that I am speaking with the correct person using two identifiers.   I discussed the limitations of evaluation and management by telemedicine and the availability of in person appointments. The patient expressed understanding and agreed to proceed.  LOCATION: Patient: Home Provider: home Office  History of Present Illness: [facility], depression, [facility].  Treatment Goal Addressed:  Pt will develop the ability to recognize, accept, and cope with feelings of depression AEB self report.  Progress towards Goal:  Progressing    Observation/Objective:   Pt participated in a group discussion on discovering and reconstructing an enduring sense of self as an important aspect for improvement of symptoms of depression, anxiety and trauma. Pt was encouraged to accomplish his self-determined goals that will enhance his sense of well-being. Pt was receptive to suggestions and encouragement given during the intervention.     Assessment and Plan: Counselor will continue to meet with patient address treatment plan goals. Patient was given a chance for recommendations of providers and implement skills learned in session practicing coping skills between sessions. Diagnosis: Anxiety and depression and ptsd    Follow-up instructions:I discussed the assessment and treatment plan with the patient. Patient was provided an opportunity to ask questions and all were answered. The patient agreed with the plan and demonstrated an understanding of the instructions.   The patient was advised to call back or seek an in-person evaluation if the symptoms worsen or if the condition fails to improve as anticipated.  Collaboration of Care: Other: Continue to see providers at [facility]  Patient/Guardian was advised Release of  Information must be obtained prior to any record release in order to collaborate their care with an outside provider. Patient/Guardian was advised if they have not already done so to contact the registration department to sign all necessary forms in order for [name] to release information regarding their care.   Consent: Patient/Guardian gives verbal consent for treatment and assignment of benefits for services provided during this visit. Patient/Guardian expressed understanding and agreed to proceed.      I provided 90 minutes of non-face-to-face time during this encounter.   [name] S, LCAS               10/08/22

## 2022-10-11 ENCOUNTER — Encounter (HOSPITAL_COMMUNITY): Payer: Self-pay | Admitting: Licensed Clinical Social Worker

## 2022-10-11 ENCOUNTER — Ambulatory Visit (HOSPITAL_COMMUNITY): Payer: No Typology Code available for payment source | Admitting: Licensed Clinical Social Worker

## 2022-10-11 DIAGNOSIS — F411 Generalized anxiety disorder: Secondary | ICD-10-CM | POA: Diagnosis not present

## 2022-10-11 DIAGNOSIS — F4310 Post-traumatic stress disorder, unspecified: Secondary | ICD-10-CM | POA: Diagnosis not present

## 2022-10-11 DIAGNOSIS — F331 Major depressive disorder, recurrent, moderate: Secondary | ICD-10-CM

## 2022-10-11 NOTE — Progress Notes (Signed)
Virtual Visit via Video Note  I connected with Mark [name] on 10/11/22 at 11am-12:00 pm EST by video-enabled telemedicine with the correct person using two identifiers.   I discussed the limitations of evaluation and management by telemedicine and the availability of in person appointments. The patient expressed understanding and agreed to proceed.  LOCATION Patient: home Provider: home office    History of Present Illness: Pt was referred to [facility] OP therapy for PTSD, depression and anxiety by the [facility].  Treatment Goal Addressed:   Pt will reduce irritability and increase normal social interaction with family and friends and will develop the ability to recognize, accept, and cope with feelings of depression and anxiety by using practiced coping skills for depression 1-2x daily AEB self report.  Progress towards Goal:  Progressing    Observations/Objective: Pt presented for today's session on time and was alert, oriented x5, with no evidence or self-report of SI/HI or A/V H.  Patient reported ongoing compliance with medication and denied any use of alcohol or illicit substances.  Clinician inquired about patient's current emotional ratings, as well as any significant changes in thoughts, feelings or behavior since previous session.  Patient reported scores of 7/10 for depression, 8/10 for anxiety, 6/10 for anger/irritability, and denied any reoccurrence of panic attacks.  Pt identified stressors that occurred in his life over the past week. Cln and pt reviewed each stressor individually and copings skills used over the past week: grounding and breathing exercises.; packing up house, moving back to [place], moving back to toxic environment, giving away ESA,  financial issues, pain, amputee disability. Pt practiced coping skills in session. "My anxiety and depression have increased this week." Cln asked open ended questions. Cln and pt discussed shame from his childhood. Cln provided  psychoeducation on shame. Cln emailed powerpoint on shame to pt to review.    Collaboration of Care: Other: Continue seeing providers at [place].    Patient/Guardian was advised Release of Information must be obtained prior to any record release in order to collaborate their care with an outside provider. Patient/Guardian was advised if they have not already done so to contact the registration department to sign all necessary forms in order for [name] to release information regarding their care.    Consent: Patient/Guardian gives verbal consent for treatment and assignment of benefits for services provided during this visit. Patient/Guardian expressed understanding and agreed to proceed.     Assessment and Plan: Counselor will continue to meet with patient and address treatment plan goals. Patient was given a chance for recommendations of providers and implement skills learned in session and encouraged to practice coping skills between sessions, practicing between sessions.Diagnosis: anxiety and depression and ptsd  Follow Up Instructions I discussed the assessment and treatment plan with the patient. The patient was provided an opportunity to ask questions and all were answered. The patient agreed with the plan and demonstrated an understanding of the instructions.   The patient was advised to call back or seek an in-person evaluation if the symptoms worsen or if the condition fails to improve as anticipated.  I provided 60 minutes of non-face-to-face time during this encounter.   Merriel Zinger S, LCAS  .

## 2022-10-15 ENCOUNTER — Encounter (HOSPITAL_COMMUNITY): Payer: Self-pay | Admitting: Licensed Clinical Social Worker

## 2022-10-15 ENCOUNTER — Ambulatory Visit (INDEPENDENT_AMBULATORY_CARE_PROVIDER_SITE_OTHER): Payer: No Typology Code available for payment source | Admitting: Licensed Clinical Social Worker

## 2022-10-15 DIAGNOSIS — F411 Generalized anxiety disorder: Secondary | ICD-10-CM | POA: Diagnosis not present

## 2022-10-15 DIAGNOSIS — F331 Major depressive disorder, recurrent, moderate: Secondary | ICD-10-CM | POA: Diagnosis not present

## 2022-10-15 DIAGNOSIS — F4310 Post-traumatic stress disorder, unspecified: Secondary | ICD-10-CM

## 2022-10-15 NOTE — Progress Notes (Signed)
Virtual Visit via Video Group Note  I connected with Mark [name] on 10/15/22 at 5:00 pm EST by a video enabled telemedicine application and verified that I am speaking with the correct person using two identifiers.   I discussed the limitations of evaluation and management by telemedicine and the availability of in person appointments. The patient expressed understanding and agreed to proceed.  LOCATION: Patient: Home Provider: home Office  History of Present Illness: [facility], depression, [facility].  Treatment Goal Addressed:  Pt will develop the ability to recognize, accept, and cope with feelings of depression AEB self report.  Progress towards Goal:  Progressing    Observation/Objective:   Pt participated in a group discussion on taking care of basic needs during high stress. Pt was able to identify areas he tends to neglect during stressful situations. During the discussion, Pt realized when basic needs are neglected mental well-being may deteriorate and which contributes to additional stress.    Assessment and Plan: Counselor will continue to meet with patient address treatment plan goals. Patient was given a chance for recommendations of providers and implement skills learned in session practicing coping skills between sessions. Diagnosis: Anxiety and depression and ptsd    Follow-up instructions:I discussed the assessment and treatment plan with the patient. Patient was provided an opportunity to ask questions and all were answered. The patient agreed with the plan and demonstrated an understanding of the instructions.   The patient was advised to call back or seek an in-person evaluation if the symptoms worsen or if the condition fails to improve as anticipated.  Collaboration of Care: Other: Continue to see providers at [facility]  Patient/Guardian was advised Release of Information must be obtained prior to any record release  in order to collaborate their care with an outside provider. Patient/Guardian was advised if they have not already done so to contact the registration department to sign all necessary forms in order for [name] to release information regarding their care.   Consent: Patient/Guardian gives verbal consent for treatment and assignment of benefits for services provided during this visit. Patient/Guardian expressed understanding and agreed to proceed.      I provided 90 minutes of non-face-to-face time during this encounter.   Danuel Felicetti S, LCAS               10/15/22

## 2022-10-18 ENCOUNTER — Ambulatory Visit (HOSPITAL_COMMUNITY): Payer: No Typology Code available for payment source | Admitting: Licensed Clinical Social Worker

## 2022-10-18 ENCOUNTER — Encounter (HOSPITAL_COMMUNITY): Payer: Self-pay | Admitting: Licensed Clinical Social Worker

## 2022-10-18 DIAGNOSIS — F331 Major depressive disorder, recurrent, moderate: Secondary | ICD-10-CM

## 2022-10-18 DIAGNOSIS — F4310 Post-traumatic stress disorder, unspecified: Secondary | ICD-10-CM

## 2022-10-18 DIAGNOSIS — F411 Generalized anxiety disorder: Secondary | ICD-10-CM

## 2022-10-18 NOTE — Progress Notes (Signed)
Virtual Visit via Video Note  I connected with Mark [name] on 10/18/22 at 11am-12:00 pm EST by video-enabled telemedicine with the correct person using two identifiers.   I discussed the limitations of evaluation and management by telemedicine and the availability of in person appointments. The patient expressed understanding and agreed to proceed.  LOCATION Patient: home Provider: home office    History of Present Illness: Pt was referred to [facility] OP therapy for PTSD, depression and anxiety by the [facility].  Treatment Goal Addressed:   Pt will reduce irritability and increase normal social interaction with family and friends and will develop the ability to recognize, accept, and cope with feelings of depression and anxiety by using practiced coping skills for depression 1-2x daily AEB self report.  Progress towards Goal:  Progressing    Observations/Objective: Pt presented for today's session on time and was alert, oriented x5, with no evidence or self-report of SI/HI or A/V H.  Patient reported ongoing compliance with medication and denied any use of alcohol or illicit substances.  Clinician inquired about patient's current emotional ratings, as well as any significant changes in thoughts, feelings or behavior since previous session.  Patient reported scores of 7/10 for depression, 8/10 for anxiety, 6/10 for anger/irritability, and denied any reoccurrence of panic attacks.  Pt identified stressors that occurred in his life over the past week. Cln and pt reviewed each stressor and copings skills used over the past week: grounding and breathing exercises.; packing up house, moving back to [place], moving back to toxic environment, giving away ESA,  financial issues, pain, amputee disability. Pt practiced coping skills in session. 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 This is my biggest stressor this week." . We discussed the impact stressors have had on pt  as well as how he copes with them. Cln validated pt feelings, noting the cumulative effect of multiple concurrent stressors. We worked to identify strategies pt might use to reduce stress. Also encouraged pt to make sure he is taking time for self-care and seeking support when needed. Pt briefly continues to discuss shame as a child. Cln asked open ended questions.  Collaboration of Care: Other: Continue seeing providers at [place].    Patient/Guardian was advised Release of Information must be obtained prior to any record release in order to collaborate their care with an outside provider. Patient/Guardian was advised if they have not already done so to contact the registration department to sign all necessary forms in order for [name] to release information regarding their care.    Consent: Patient/Guardian gives verbal consent for treatment and assignment of benefits for services provided during this visit. Patient/Guardian expressed understanding and agreed to proceed.     Assessment and Plan: Counselor will continue to meet with patient and address treatment plan goals. Patient was given a chance for recommendations of providers and implement skills learned in session and encouraged to practice coping skills between sessions, practicing between sessions.Diagnosis: anxiety and depression and ptsd  Follow Up Instructions I discussed the assessment and treatment plan with the patient. The patient was provided an opportunity to ask questions and all were answered. The patient agreed with the plan and demonstrated an understanding of the instructions.   The patient was advised to call back or seek an in-person evaluation if the symptoms worsen or if the condition fails to improve as anticipated.  I provided 60 minutes of non-face-to-face time during this encounter.  Mark [name] S, LCAS  .

## 2022-10-22 ENCOUNTER — Ambulatory Visit (INDEPENDENT_AMBULATORY_CARE_PROVIDER_SITE_OTHER): Payer: No Typology Code available for payment source | Admitting: Licensed Clinical Social Worker

## 2022-10-22 ENCOUNTER — Encounter (HOSPITAL_COMMUNITY): Payer: Self-pay | Admitting: Licensed Clinical Social Worker

## 2022-10-22 DIAGNOSIS — F411 Generalized anxiety disorder: Secondary | ICD-10-CM

## 2022-10-22 DIAGNOSIS — F4310 Post-traumatic stress disorder, unspecified: Secondary | ICD-10-CM

## 2022-10-22 DIAGNOSIS — F331 Major depressive disorder, recurrent, moderate: Secondary | ICD-10-CM

## 2022-10-22 NOTE — Progress Notes (Signed)
Virtual Visit via Video Group Note  I connected with Mark [name] on 10/22/22 at 5:00 pm EST by a video enabled telemedicine application and verified that I am speaking with the correct person using two identifiers.   I discussed the limitations of evaluation and management by telemedicine and the availability of in person appointments. The patient expressed understanding and agreed to proceed.  LOCATION: Patient: Home Provider: home Office  History of Present Illness: [facility], depression, [facility].  Treatment Goal Addressed:  Pt will develop the ability to recognize, accept, and cope with feelings of depression AEB self report.  Progress towards Goal:  Progressing    Observation/Objective:   Pt participated in a group discussion on childhood memories, it's effects today, how it affects mental health, choices in life and outcomes in adulthood. It was suggested to pt to continue working on mental health issues and use coping techniques.     Assessment and Plan: Counselor will continue to meet with patient address treatment plan goals. Patient was given a chance for recommendations of providers and implement skills learned in session practicing coping skills between sessions. Diagnosis: Anxiety and depression and ptsd    Follow-up instructions:I discussed the assessment and treatment plan with the patient. Patient was provided an opportunity to ask questions and all were answered. The patient agreed with the plan and demonstrated an understanding of the instructions.   The patient was advised to call back or seek an in-person evaluation if the symptoms worsen or if the condition fails to improve as anticipated.  Collaboration of Care: Other: Continue to see providers at [facility]  Patient/Guardian was advised Release of Information must be obtained prior to any record release in order to collaborate their care with an outside provider.  Patient/Guardian was advised if they have not already done so to contact the registration department to sign all necessary forms in order for [name] to release information regarding their care.   Consent: Patient/Guardian gives verbal consent for treatment and assignment of benefits for services provided during this visit. Patient/Guardian expressed understanding and agreed to proceed.      I provided 90 minutes of non-face-to-face time during this encounter.   Zuriah Bordas S, LCAS               10/22/22

## 2022-10-25 ENCOUNTER — Ambulatory Visit (INDEPENDENT_AMBULATORY_CARE_PROVIDER_SITE_OTHER): Payer: No Typology Code available for payment source | Admitting: Licensed Clinical Social Worker

## 2022-10-25 DIAGNOSIS — F411 Generalized anxiety disorder: Secondary | ICD-10-CM | POA: Diagnosis not present

## 2022-10-25 DIAGNOSIS — F4310 Post-traumatic stress disorder, unspecified: Secondary | ICD-10-CM | POA: Diagnosis not present

## 2022-10-25 DIAGNOSIS — F331 Major depressive disorder, recurrent, moderate: Secondary | ICD-10-CM

## 2022-10-29 ENCOUNTER — Encounter (HOSPITAL_COMMUNITY): Payer: Self-pay | Admitting: Licensed Clinical Social Worker

## 2022-10-29 ENCOUNTER — Ambulatory Visit (INDEPENDENT_AMBULATORY_CARE_PROVIDER_SITE_OTHER): Payer: No Typology Code available for payment source | Admitting: Licensed Clinical Social Worker

## 2022-10-29 DIAGNOSIS — F4310 Post-traumatic stress disorder, unspecified: Secondary | ICD-10-CM

## 2022-10-29 DIAGNOSIS — F411 Generalized anxiety disorder: Secondary | ICD-10-CM | POA: Diagnosis not present

## 2022-10-29 DIAGNOSIS — F331 Major depressive disorder, recurrent, moderate: Secondary | ICD-10-CM | POA: Diagnosis not present

## 2022-10-29 NOTE — Progress Notes (Signed)
Virtual Visit via Video Note  I connected with Mark [name] on 10/25/22 at 11am-12:00 pm EST by video-enabled telemedicine with the correct person using two identifiers.   I discussed the limitations of evaluation and management by telemedicine and the availability of in person appointments. The patient expressed understanding and agreed to proceed.  LOCATION Patient: home Provider: home office    History of Present Illness: Pt was referred to [facility] OP therapy for PTSD, depression and anxiety by the [facility].  Treatment Goal Addressed:   Pt will reduce irritability and increase normal social interaction with family and friends and will develop the ability to recognize, accept, and cope with feelings of depression and anxiety by using practiced coping skills for depression 1-2x daily AEB self report.  Progress towards Goal:  Progressing    Observations/Objective: Pt presented for today's session on time and was alert, oriented x5, with no evidence or self-report of SI/HI or A/V H.  Patient reported ongoing compliance with medication and denied any use of alcohol or illicit substances.  Clinician inquired about patient's current emotional ratings, as well as any significant changes in thoughts, feelings or behavior since previous session. Patient reported scores of 7/10 for depression, 8/10 for anxiety, 6/10 for anger/irritability, and denied any reoccurrence of panic attacks.  Pt identified stressors that occurred in his life over the past week. Cln and pt reviewed each stressor and copings skills used over the past week: grounding and breathing exercises:  packing up house, moving back to [place], moving back to toxic environment, giving away ESA,  financial issues, chronic pain, amputee disability. Pt practiced coping skills in session. We discussed the impact stressors have had on pt as well as how he copes with them. Cln validated pt feelings, noting the cumulative effect of multiple concurrent  stressors. Pt continues to discuss shame as a child with cln providing psychoeducation.   Collaboration of Care: Other: Continue seeing providers at [place].    Patient/Guardian was advised Release of Information must be obtained prior to any record release in order to collaborate their care with an outside provider. Patient/Guardian was advised if they have not already done so to contact the registration department to sign all necessary forms in order for [name] to release information regarding their care.    Consent: Patient/Guardian gives verbal consent for treatment and assignment of benefits for services provided during this visit. Patient/Guardian expressed understanding and agreed to proceed.     Assessment and Plan: Counselor will continue to meet with patient and address treatment plan goals. Patient was given a chance for recommendations of providers and implement skills learned in session and encouraged to practice coping skills between sessions, practicing between sessions.Diagnosis: anxiety, depression and ptsd  Follow Up Instructions I discussed the assessment and treatment plan with the patient. The patient was provided an opportunity to ask questions and all were answered. The patient agreed with the plan and demonstrated an understanding of the instructions.   The patient was advised to call back or seek an in-person evaluation if the symptoms worsen or if the condition fails to improve as anticipated.  I provided 60 minutes of non-face-to-face time during this encounter.   Selda Jalbert S, LCAS  .

## 2022-10-29 NOTE — Progress Notes (Signed)
Virtual Visit via Video Group Note  I connected with Mark [name] on 10/29/22 at 5:00 pm EST by a video enabled telemedicine application and verified that I am speaking with the correct person using two identifiers.   I discussed the limitations of evaluation and management by telemedicine and the availability of in person appointments. The patient expressed understanding and agreed to proceed.  LOCATION: Patient: Home Provider: home Office  History of Present Illness: [facility], [facility], [facility].  Treatment Goal Addressed:  Pt will develop the ability to recognize, accept, and cope with feelings of depression AEB self report.  Progress towards Goal:  Progressing    Observation/Objective:   Pt participated in a group discussion on discovering and reconstructing an enduring sense of self as an important aspect for improvement of symptoms of depression, trauma, and anxiety. Pt was encouraged to accomplish his self-determined goals that will enhance his sense of well-being. Pt was receptive to suggestions and encouragement given during the intervention.   Assessment and Plan: Counselor will continue to meet with patient address treatment plan goals. Patient was given a chance for recommendations of providers and implement skills learned in session practicing coping skills between sessions. Diagnosis: Anxiety and depression and ptsd    Follow-up instructions:I discussed the assessment and treatment plan with the patient. Patient was provided an opportunity to ask questions and all were answered. The patient agreed with the plan and demonstrated an understanding of the instructions.   The patient was advised to call back or seek an in-person evaluation if the symptoms worsen or if the condition fails to improve as anticipated.  Collaboration of Care: Other: Continue to see providers at [facility]  Patient/Guardian was advised Release of  Information must be obtained prior to any record release in order to collaborate their care with an outside provider. Patient/Guardian was advised if they have not already done so to contact the registration department to sign all necessary forms in order for [name] to release information regarding their care.   Consent: Patient/Guardian gives verbal consent for treatment and assignment of benefits for services provided during this visit. Patient/Guardian expressed understanding and agreed to proceed.      I provided 90 minutes of non-face-to-face time during this encounter.   Stephon Weathers S, LCAS              10/29/22

## 2022-11-01 ENCOUNTER — Encounter (HOSPITAL_COMMUNITY): Payer: Self-pay | Admitting: Licensed Clinical Social Worker

## 2022-11-01 ENCOUNTER — Ambulatory Visit (HOSPITAL_COMMUNITY): Payer: No Typology Code available for payment source | Admitting: Licensed Clinical Social Worker

## 2022-11-01 DIAGNOSIS — F331 Major depressive disorder, recurrent, moderate: Secondary | ICD-10-CM | POA: Diagnosis not present

## 2022-11-01 DIAGNOSIS — F4310 Post-traumatic stress disorder, unspecified: Secondary | ICD-10-CM

## 2022-11-01 DIAGNOSIS — F411 Generalized anxiety disorder: Secondary | ICD-10-CM | POA: Diagnosis not present

## 2022-11-01 NOTE — Progress Notes (Signed)
Virtual Visit via Video Note  I connected with Mark [name] on 11/01/22 at 11am-11:45am EST by video-enabled telemedicine with the correct person using two identifiers.   I discussed the limitations of evaluation and management by telemedicine and the availability of in person appointments. The patient expressed understanding and agreed to proceed.  LOCATION Patient: home Provider: home office    History of Present Illness: Pt was referred to [facility] OP therapy for PTSD, depression and anxiety by the [facility].  Treatment Goal Addressed:   Pt will reduce irritability and increase normal social interaction with family and friends and will develop the ability to recognize, accept, and cope with feelings of depression and anxiety by using practiced coping skills for depression 1-2x daily AEB self report.  Progress towards Goal:  Progressing    Observations/Objective: Pt presented for today's session on time and was alert, oriented x5, with no evidence or self-report of SI/HI or A/V H.  Patient reported ongoing compliance with medication and denied any use of alcohol or illicit substances.  Clinician inquired about patient's current emotional ratings, as well as any significant changes in thoughts, feelings or behavior since previous session. Patient reported scores of 9/10 for depression, 8/10 for anxiety, 6/10 for anger/irritability, and denied any reoccurrence of panic attacks.  Pt identified stressors that occurred in his life over the past week. Cln and pt reviewed each stressor and coping skills used over the past week: grounding and breathing exercises:  packing up house, moving back to [place], moving back to toxic environment, giving away ESA,  financial issues, chronic pain, pain in back due to blisters, (Will eventually need surgery),amputee disability. Pt practiced coping skills in session. We discussed the impact stressors have had on pt as well as how he copes with them within the past  week. [name] suggested patient continue to identify positives that  impact his mood. he learned how to recognize the physical, cognitive, emotional, and behavioral issues associated with his mood.   Collaboration of Care: Other: Continue seeing providers at [place].    Patient/Guardian was advised Release of Information must be obtained prior to any record release in order to collaborate their care with an outside provider. Patient/Guardian was advised if they have not already done so to contact the registration department to sign all necessary forms in order for [name] to release information regarding their care.    Consent: Patient/Guardian gives verbal consent for treatment and assignment of benefits for services provided during this visit. Patient/Guardian expressed understanding and agreed to proceed.     Assessment and Plan: Counselor will continue to meet with patient and address treatment plan goals. Patient was given a chance for recommendations of providers and implement skills learned in session and encouraged to practice coping skills between sessions, practicing between sessions.Diagnosis: anxiety, depression and ptsd  Follow Up Instructions I discussed the assessment and treatment plan with the patient. The patient was provided an opportunity to ask questions and all were answered. The patient agreed with the plan and demonstrated an understanding of the instructions.   The patient was advised to call back or seek an in-person evaluation if the symptoms worsen or if the condition fails to improve as anticipated.  I provided 45 minutes of non-face-to-face time during this encounter.   Letzy Gullickson S, LCAS  .

## 2022-11-05 ENCOUNTER — Ambulatory Visit (INDEPENDENT_AMBULATORY_CARE_PROVIDER_SITE_OTHER): Payer: No Typology Code available for payment source | Admitting: Licensed Clinical Social Worker

## 2022-11-05 DIAGNOSIS — F331 Major depressive disorder, recurrent, moderate: Secondary | ICD-10-CM | POA: Diagnosis not present

## 2022-11-05 DIAGNOSIS — F4310 Post-traumatic stress disorder, unspecified: Secondary | ICD-10-CM

## 2022-11-05 DIAGNOSIS — F411 Generalized anxiety disorder: Secondary | ICD-10-CM | POA: Diagnosis not present

## 2022-11-06 ENCOUNTER — Encounter (HOSPITAL_COMMUNITY): Payer: Self-pay | Admitting: Licensed Clinical Social Worker

## 2022-11-06 NOTE — Progress Notes (Addendum)
Virtual Visit via Video Group Note  I connected with Mark [name] on 11/05/22 at 5:00 pm EST by a video enabled telemedicine application and verified that I am speaking with the correct person using two identifiers.   I discussed the limitations of evaluation and management by telemedicine and the availability of in person appointments. The patient expressed understanding and agreed to proceed.  LOCATION: Patient: Home Provider: home Office  History of Present Illness: [facility], [facility], [facility].  Treatment Goal Addressed:  Pt will develop the ability to recognize, accept, and cope with feelings of depression AEB self report.  Progress towards Goal:  Progressing    Observation/Objective:   Pt participated in a group discussion on discovering and reconstructing an enduring sense of self as an important aspect for improvement of symptoms of depression, trauma, and anxiety. Pt was encouraged to accomplish his self-determined goals that will enhance his sense of well-being. Pt was receptive to suggestions and encouragement given during the intervention.   Assessment and Plan:  Pt participated in a group discussion on current source of stressors. Pt identified his biggest stressor:  moving back home to [place], which led the discussion to coping skills. Pt was encouraged to continue using his current coping techniques.     Follow-up instructions:I discussed the assessment and treatment plan with the patient. Patient was provided an opportunity to ask questions and all were answered. The patient agreed with the plan and demonstrated an understanding of the instructions.   The patient was advised to call back or seek an in-person evaluation if the symptoms worsen or if the condition fails to improve as anticipated.  Collaboration of Care: Other: Continue to see providers at [facility]  Patient/Guardian was advised Release of Information  must be obtained prior to any record release in order to collaborate their care with an outside provider. Patient/Guardian was advised if they have not already done so to contact the registration department to sign all necessary forms in order for [name] to release information regarding their care.   Consent: Patient/Guardian gives verbal consent for treatment and assignment of benefits for services provided during this visit. Patient/Guardian expressed understanding and agreed to proceed.      I provided 90 minutes of non-face-to-face time during this encounter.   Tegan Burnside S, LCAS              11/05/22

## 2022-11-08 ENCOUNTER — Ambulatory Visit (HOSPITAL_COMMUNITY): Payer: No Typology Code available for payment source | Admitting: Licensed Clinical Social Worker

## 2022-11-08 ENCOUNTER — Encounter (HOSPITAL_COMMUNITY): Payer: Self-pay | Admitting: Licensed Clinical Social Worker

## 2022-11-08 DIAGNOSIS — F411 Generalized anxiety disorder: Secondary | ICD-10-CM | POA: Diagnosis not present

## 2022-11-08 DIAGNOSIS — F331 Major depressive disorder, recurrent, moderate: Secondary | ICD-10-CM

## 2022-11-08 DIAGNOSIS — F4310 Post-traumatic stress disorder, unspecified: Secondary | ICD-10-CM

## 2022-11-08 NOTE — Progress Notes (Signed)
Virtual Visit via Video Note  I connected with Mark [name] on 11/08/22 at 11am-11:45am EST by video-enabled telemedicine with the correct person using two identifiers.   I discussed the limitations of evaluation and management by telemedicine and the availability of in person appointments. The patient expressed understanding and agreed to proceed.  LOCATION Patient: home Provider: home office    History of Present Illness: Pt was referred to [facility] OP therapy for PTSD, depression and anxiety by the [facility].  Treatment Goal Addressed:   Pt will reduce irritability and increase normal social interaction with family and friends and will develop the ability to recognize, accept, and cope with feelings of depression and anxiety by using practiced coping skills for depression 1-2x daily AEB self report.  Progress towards Goal:  Progressing    Observations/Objective: Pt presented for today's session on time and was alert, oriented x5, with no evidence or self-report of SI/HI or A/V H.  Patient reported ongoing compliance with medication and denied any use of alcohol or illicit substances.  Clinician inquired about patient's current emotional ratings, as well as any significant changes in thoughts, feelings or behavior since previous session. Patient reported scores of 7/10 for depression, 8/10 for anxiety, 4/10 for anger/irritability, and denied any reoccurrence of panic attacks.  Pt identified stressors that occurred in his life over the past week. Cln and pt reviewed each stressor and coping skills used over the past week: grounding and breathing exercises:  packing up house, [place] back to [place], moving back to toxic environment, giving away ESA,  financial issues, chronic pain, pain in back due to blisters, (Will eventually need surgery),amputee disability. "Im going to the dr tomorrow at the [facility] because my hands are swollen and I have blisters on my back which are bursting." Cln provided  psychoeducation on using mindfulness-based stress response to assist him in lowering the stress response. Patient was encouraged to use mindfulness skills as a coping skill to assist in lowering his stress response.     Collaboration of Care: Other: Continue seeing providers at [place].    Patient/Guardian was advised Release of Information must be obtained prior to any record release in order to collaborate their care with an outside provider. Patient/Guardian was advised if they have not already done so to contact the registration department to sign all necessary forms in order for [name] to release information regarding their care.    Consent: Patient/Guardian gives verbal consent for treatment and assignment of benefits for services provided during this visit. Patient/Guardian expressed understanding and agreed to proceed.     Assessment and Plan: Counselor will continue to meet with patient and address treatment plan goals. Patient was given a chance for recommendations of providers and implement skills learned in session and encouraged to practice coping skills between sessions, practicing between sessions.Diagnosis: anxiety, depression and ptsd  Follow Up Instructions I discussed the assessment and treatment plan with the patient. The patient was provided an opportunity to ask questions and all were answered. The patient agreed with the plan and demonstrated an understanding of the instructions.   The patient was advised to call back or seek an in-person evaluation if the symptoms worsen or if the condition fails to improve as anticipated.  I provided 45 minutes of non-face-to-face time during this encounter.   Sherae Santino S, LCAS  .

## 2022-11-12 ENCOUNTER — Ambulatory Visit (INDEPENDENT_AMBULATORY_CARE_PROVIDER_SITE_OTHER): Payer: No Typology Code available for payment source | Admitting: Licensed Clinical Social Worker

## 2022-11-12 DIAGNOSIS — F411 Generalized anxiety disorder: Secondary | ICD-10-CM

## 2022-11-12 DIAGNOSIS — F331 Major depressive disorder, recurrent, moderate: Secondary | ICD-10-CM | POA: Diagnosis not present

## 2022-11-12 DIAGNOSIS — F4310 Post-traumatic stress disorder, unspecified: Secondary | ICD-10-CM

## 2022-11-13 ENCOUNTER — Emergency Department (HOSPITAL_COMMUNITY): Payer: No Typology Code available for payment source

## 2022-11-13 ENCOUNTER — Emergency Department (HOSPITAL_COMMUNITY)
Admission: EM | Admit: 2022-11-13 | Discharge: 2022-11-14 | Disposition: A | Discharge status: Home / Self Care | Payer: No Typology Code available for payment source | Attending: Emergency Medicine | Admitting: Emergency Medicine

## 2022-11-13 ENCOUNTER — Other Ambulatory Visit: Payer: Self-pay

## 2022-11-13 ENCOUNTER — Encounter (HOSPITAL_COMMUNITY): Payer: Self-pay | Admitting: Licensed Clinical Social Worker

## 2022-11-13 DIAGNOSIS — Z4800 Encounter for change or removal of nonsurgical wound dressing: Secondary | ICD-10-CM | POA: Insufficient documentation

## 2022-11-13 DIAGNOSIS — M7989 Other specified soft tissue disorders: Secondary | ICD-10-CM | POA: Insufficient documentation

## 2022-11-13 DIAGNOSIS — R0602 Shortness of breath: Secondary | ICD-10-CM | POA: Insufficient documentation

## 2022-11-13 DIAGNOSIS — K8080 Other cholelithiasis without obstruction: Secondary | ICD-10-CM | POA: Diagnosis not present

## 2022-11-13 DIAGNOSIS — Z5189 Encounter for other specified aftercare: Secondary | ICD-10-CM

## 2022-11-13 LAB — COMPREHENSIVE METABOLIC PANEL
ALT: 19 U/L (ref 0–44)
AST: 23 U/L (ref 15–41)
Albumin: 3.4 g/dL — ABNORMAL LOW (ref 3.5–5.0)
Alkaline Phosphatase: 72 U/L (ref 38–126)
Anion gap: 10 (ref 5–15)
BUN: 11 mg/dL (ref 8–23)
CO2: 25 mmol/L (ref 22–32)
Calcium: 8.8 mg/dL — ABNORMAL LOW (ref 8.9–10.3)
Chloride: 104 mmol/L (ref 98–111)
Creatinine, Ser: 1.11 mg/dL (ref 0.61–1.24)
GFR, Estimated: >60 mL/min (ref >60)
Glucose, Bld: 119 mg/dL — ABNORMAL HIGH (ref 70–99)
Potassium: 4.3 mmol/L (ref 3.5–5.1)
Sodium: 139 mmol/L (ref 135–145)
Total Bilirubin: 0.4 mg/dL (ref 0.3–1.2)
Total Protein: 6.7 g/dL (ref 6.5–8.1)

## 2022-11-13 LAB — CBC WITH DIFFERENTIAL/PLATELET
Abs Immature Granulocytes: 0.05 10*3/uL (ref 0.00–0.07)
Basophils Absolute: 0 10*3/uL (ref 0.0–0.1)
Basophils Relative: 0 %
Eosinophils Absolute: 0.1 10*3/uL (ref 0.0–0.5)
Eosinophils Relative: 1 %
HCT: 40.2 % (ref 39.0–52.0)
Hemoglobin: 12.6 g/dL — ABNORMAL LOW (ref 13.0–17.0)
Immature Granulocytes: 1 %
Lymphocytes Relative: 20 %
Lymphs Abs: 2 10*3/uL (ref 0.7–4.0)
MCH: 26.5 pg (ref 26.0–34.0)
MCHC: 31.3 g/dL (ref 30.0–36.0)
MCV: 84.6 fL (ref 80.0–100.0)
Monocytes Absolute: 0.8 10*3/uL (ref 0.1–1.0)
Monocytes Relative: 8 %
Neutro Abs: 7 10*3/uL (ref 1.7–7.7)
Neutrophils Relative %: 70 %
Platelets: 223 10*3/uL (ref 150–400)
RBC: 4.75 MIL/uL (ref 4.22–5.81)
RDW: 14.3 % (ref 11.5–15.5)
WBC: 10 10*3/uL (ref 4.0–10.5)
nRBC: 0 % (ref 0.0–0.2)

## 2022-11-13 LAB — BRAIN NATRIURETIC PEPTIDE: B Natriuretic Peptide: 114.1 pg/mL — ABNORMAL HIGH (ref 0.0–100.0)

## 2022-11-13 LAB — I-STAT CG4 LACTIC ACID, ED: Lactic Acid, Venous: 1.1 mmol/L (ref 0.5–1.9)

## 2022-11-13 LAB — TROPONIN I (HIGH SENSITIVITY): Troponin I (High Sensitivity): 19 ng/L — ABNORMAL HIGH (ref <18)

## 2022-11-13 MED ORDER — MORPHINE SULFATE (PF) 4 MG/ML IV SOLN
4.0000 mg | Freq: Once | INTRAVENOUS | Status: AC
  Administered 2022-11-13: 4 mg via INTRAVENOUS
  Filled 2022-11-13: qty 1

## 2022-11-13 MED ORDER — IOHEXOL 350 MG/ML SOLN
75.0000 mL | Freq: Once | INTRAVENOUS | Status: AC | PRN
  Administered 2022-11-13: 75 mL via INTRAVENOUS

## 2022-11-13 NOTE — ED Provider Notes (Incomplete)
[facility]-EMERGENCY DEPT [facility] Emergency Department Provider Note MRN:  [id number]  Arrival date & time: 11/14/22     Chief Complaint   SOB and abscess  History of Present Illness   Mark [name] is a 62 y.o. year-old male presents to the ED with chief complaint of SOB and abscess. Was seen at [facility] and referred to the ER. He complains of gradually worsening SOB and swelling on his extremities.  He states that anytime he does anything active he gets very short of breath.  States that yesterday his heart rate was racing to the 120s while at rest.  He reports new swelling on his extremities.  He denies fever, chills, cough, or chest pain.    Additionally, he reports pilonidal abscess.  He has had multiple prior surgeries and I&Ds.  He states that it has been draining and oozing blood.  History provided by patient.   Review of Systems  Pertinent positive and negative review of systems noted in HPI.    Physical Exam   Vitals:   11/13/22 2043 11/14/22 0013  BP: (!) 178/68 (!) 173/84  Pulse: 96 93  Resp: (!) 22 15  Temp: 97.8 F (36.6 C) 97.8 F (36.6 C)  SpO2: 100% 100%    CONSTITUTIONAL:  non toxic-appearing, NAD NEURO:  Alert and oriented x 3, CN 3-12 grossly intact EYES:  eyes equal and reactive ENT/NECK:  Supple, no stridor  CARDIO:  tachycardic, regular rhythm, appears well-perfused  PULM:  No respiratory distress, diminished GI/GU:  non-distended, no focal tenderness, MSK/SPINE:  No gross deformities, no edema, moves all extremities  SKIN: there is fluctuance and tenderness to the low back, and scarring in the midline from prior surgeries   *Additional and/or pertinent findings included in MDM below  Diagnostic and Interventional Summary    EKG Interpretation Date/Time:  Tuesday November 13 2022 15:35:24 EDT Ventricular Rate:  98 PR Interval:  154 QRS Duration:  82 QT Interval:  334 QTC Calculation: 426 R Axis:   56  Text Interpretation: Sinus  rhythm with occasional Premature ventricular complexes Low voltage QRS Septal infarct , age undetermined Abnormal ECG When compared with ECG of 24-May-2022 18:30, PREVIOUS ECG IS PRESENT Confirmed by Kennis Carina [phone]) on 11/13/2022 10:59:35 PM       Labs Reviewed  CBC WITH DIFFERENTIAL/PLATELET - Abnormal; Notable for the following components:      Result Value   Hemoglobin 12.6 (*)    All other components within normal limits  BRAIN NATRIURETIC PEPTIDE - Abnormal; Notable for the following components:   B Natriuretic Peptide 114.1 (*)    All other components within normal limits  COMPREHENSIVE METABOLIC PANEL - Abnormal; Notable for the following components:   Glucose, Bld 119 (*)    Calcium 8.8 (*)    Albumin 3.4 (*)    All other components within normal limits  TROPONIN I (HIGH SENSITIVITY) - Abnormal; Notable for the following components:   Troponin I (High Sensitivity) 19 (*)    All other components within normal limits  CULTURE, BLOOD (ROUTINE X 2)  CULTURE, BLOOD (ROUTINE X 2)  I-STAT CG4 LACTIC ACID, ED  I-STAT CG4 LACTIC ACID, ED  TROPONIN I (HIGH SENSITIVITY)    CT Angio Chest PE W and/or Wo Contrast  Final Result    CT ABDOMEN PELVIS W CONTRAST  Final Result    DG Chest 2 View  Final Result      Medications  doxycycline (VIBRA-TABS) tablet 100 mg (has no administration  in time range)  morphine (PF) 4 MG/ML injection 4 mg (4 mg Intravenous Given 11/13/22 2219)  iohexol (OMNIPAQUE) 350 MG/ML injection 75 mL (75 mLs Intravenous Contrast Given 11/13/22 2247)     Procedures  /  Critical Care Procedures  ED Course and Medical Decision Making  I have reviewed the triage vital signs, the nursing notes, and pertinent available records from the EMR.  Social Determinants Affecting Complexity of Care: Patient has no clinically significant social determinants affecting this chief complaint..   ED Course:    Medical Decision Making Patient here with shortness  of breath and here for wound check for pilonidal abscess.  He was seen at the [facility] and sent to the emergency department for further evaluation of his extremity swelling.  He denies history of CHF.  He states that he does feel short of breath with activity.  I ordered CT PE study which was negative for PE.  There was an incidental finding of pulmonary nodule and cholelithiasis.  I discussed these results with the patient.  He is not hypoxic.  He is in no distress.  Will prescribe some Lasix for home diuresis.  Patient advised to return for new or worsening symptoms.  I do not see any draining pus from his low back near where he has had pilonidal abscess in the past.  There is a small amount of blood, and he might of had an abrasion.  There was no evidence of fluid collection on CT.  Given his history and reported pain, I will start him on antibiotic, but at this time I do not think that there is any drainable abscess.  I discussed all the patient's results and have [facility] or to return here for new or worsening symptoms.  He is agreeable with discharge plan.  Amount and/or Complexity of Data Reviewed Labs: ordered.    Details: BNP slightly elevated at 114, no recent baseline to compare to.  Initial troponin is 19, which is similar to where patient has been in the past.  Repeat pending.  Lactic 1.1, no leukocytosis, no sign of significant infection Radiology: ordered and independent interpretation performed. Decision-making details documented in ED Course.    Details: No visible pilonidal abscess  Risk Prescription drug management.    Repeat trop is 26.  No chest pain.  No ischemic EKG changes.  Doubt ACS.     Consultants: No consultations were needed in caring for this patient.   Treatment and Plan: I considered admission due to patient's initial presentation, but after considering the examination and diagnostic results, patient will not require  admission and can be discharged with outpatient follow-up.  I discussed the case, labs including repeat trop, and imaging results with Dr. Pilar Plate, who agrees with plan for discharge.  Final Clinical Impressions(s) / ED Diagnoses     ICD-10-CM   1. SOB (shortness of breath)  R06.02     2. Leg swelling  M79.89     3. Wound check, abscess  Z51.89       ED Discharge Orders          Ordered    doxycycline (VIBRAMYCIN) 100 MG capsule  2 times daily        11/14/22 0054    furosemide (LASIX) 40 MG tablet  Daily        11/14/22 0054              Discharge Instructions Discussed with and Provided to  Patient:   Discharge Instructions   None      Roxy Horseman, PA-C 11/14/22 0528    Roxy Horseman, PA-C 11/14/22 8295    Sabas Sous, MD 11/14/22 [phone]

## 2022-11-13 NOTE — ED Triage Notes (Signed)
Patient arrives from [facility] for eval of swelling over entire L side x 2 days. Also states had a cyst on his lower back/upper buttocks burst on Sunday but has not drained all of the way.

## 2022-11-13 NOTE — Progress Notes (Signed)
Virtual Visit via Video Group Note  I connected with Mark [name] on 11/05/22 at 5:00 pm EST by a video enabled telemedicine application and verified that I am speaking with the correct person using two identifiers.   I discussed the limitations of evaluation and management by telemedicine and the availability of in person appointments. The patient expressed understanding and agreed to proceed.  LOCATION: Patient: Home Provider: home Office  History of Present Illness: [facility], [facility], [facility].  Treatment Goal Addressed:  Pt will develop the ability to recognize, accept, and cope with feelings of depression AEB self report.  Progress towards Goal:  Progressing    Observation/Objective:   Pt participated in a group discussion on discovering and reconstructing an enduring sense of self as an important aspect for improvement of symptoms of depression, trauma, and anxiety. Pt was encouraged to accomplish his self-determined goals that will enhance his sense of well-being. Pt was receptive to suggestions and encouragement given during the intervention.   Assessment and Plan:  Pt participated in a group discussion on family dysfunction. Clinician assessed patient's family dynamics and discussed how the dynamics influence mental health symptoms in a negative way. Patient was encouraged to work to improve family function.      Follow-up instructions:I discussed the assessment and treatment plan with the patient. Patient was provided an opportunity to ask questions and all were answered. The patient agreed with the plan and demonstrated an understanding of the instructions.   The patient was advised to call back or seek an in-person evaluation if the symptoms worsen or if the condition fails to improve as anticipated.  Collaboration of Care: Other: Continue to see providers at [facility]  Patient/Guardian was advised Release of Information  must be obtained prior to any record release in order to collaborate their care with an outside provider. Patient/Guardian was advised if they have not already done so to contact the registration department to sign all necessary forms in order for [name] to release information regarding their care.   Consent: Patient/Guardian gives verbal consent for treatment and assignment of benefits for services provided during this visit. Patient/Guardian expressed understanding and agreed to proceed.      I provided 60 minutes of non-face-to-face time during this encounter.   Elenor Wildes S, LCAS              11/12/22

## 2022-11-13 NOTE — ED Provider Triage Note (Signed)
Emergency Medicine Provider Triage Evaluation Note  Mathayus Heinig , a 62 y.o. male  was evaluated in triage.  Pt complains of SHOB with left side body swelling onset yesterday. No hx CHF. Right AKA. Pilonidal cyst opened and draining. Seen by [name] and sent to ER.   Review of Systems  Positive:  Negative:   Physical Exam  BP (!) 172/80 (BP Location: Left Arm)   Pulse (!) 105   Temp 98 F (36.7 C)   Resp 18   SpO2 94%  Gen:   Awake, no distress   Resp:  Normal effort  MSK:   Moves extremities without difficulty  Other:    Medical Decision Making  Medically screening exam initiated at 3:59 PM.  Appropriate orders placed.  Shihab Baechle was informed that the remainder of the evaluation will be completed by another provider, this initial triage assessment does not replace that evaluation, and the importance of remaining in the ED until their evaluation is complete.     Jeannie Fend, PA-C 11/13/22 1600

## 2022-11-14 LAB — TROPONIN I (HIGH SENSITIVITY): Troponin I (High Sensitivity): 26 ng/L — ABNORMAL HIGH (ref <18)

## 2022-11-14 MED ORDER — DOXYCYCLINE HYCLATE 100 MG PO CAPS: 100.0000 mg | ORAL_CAPSULE | Freq: Two times a day (BID) | ORAL | 0 refills | Status: DC

## 2022-11-14 MED ORDER — FUROSEMIDE 40 MG PO TABS: 40.0000 mg | ORAL_TABLET | Freq: Every day | ORAL | 0 refills | Status: DC

## 2022-11-14 MED ORDER — FUROSEMIDE 40 MG PO TABS: 40.0000 mg | ORAL_TABLET | Freq: Every day | ORAL | 0 refills | Status: AC

## 2022-11-14 MED ORDER — DOXYCYCLINE HYCLATE 100 MG PO TABS
100.0000 mg | ORAL_TABLET | Freq: Once | ORAL | Status: AC
  Administered 2022-11-14: 100 mg via ORAL
  Filled 2022-11-14: qty 1

## 2022-11-15 ENCOUNTER — Ambulatory Visit (HOSPITAL_COMMUNITY): Payer: No Typology Code available for payment source | Admitting: Licensed Clinical Social Worker

## 2022-11-15 DIAGNOSIS — F411 Generalized anxiety disorder: Secondary | ICD-10-CM | POA: Diagnosis not present

## 2022-11-15 DIAGNOSIS — F331 Major depressive disorder, recurrent, moderate: Secondary | ICD-10-CM | POA: Diagnosis not present

## 2022-11-15 DIAGNOSIS — F4310 Post-traumatic stress disorder, unspecified: Secondary | ICD-10-CM | POA: Diagnosis not present

## 2022-11-18 LAB — CULTURE, BLOOD (ROUTINE X 2)
Culture: NO GROWTH
Special Requests: ADEQUATE

## 2022-11-19 ENCOUNTER — Ambulatory Visit (INDEPENDENT_AMBULATORY_CARE_PROVIDER_SITE_OTHER): Payer: No Typology Code available for payment source | Admitting: Licensed Clinical Social Worker

## 2022-11-19 ENCOUNTER — Encounter (HOSPITAL_COMMUNITY): Payer: Self-pay | Admitting: Licensed Clinical Social Worker

## 2022-11-19 DIAGNOSIS — F331 Major depressive disorder, recurrent, moderate: Secondary | ICD-10-CM

## 2022-11-19 DIAGNOSIS — F4310 Post-traumatic stress disorder, unspecified: Secondary | ICD-10-CM

## 2022-11-19 DIAGNOSIS — F32A Depression, unspecified: Secondary | ICD-10-CM

## 2022-11-19 DIAGNOSIS — F411 Generalized anxiety disorder: Secondary | ICD-10-CM

## 2022-11-19 LAB — CULTURE, BLOOD (ROUTINE X 2): Culture: NO GROWTH

## 2022-11-19 NOTE — Progress Notes (Signed)
Virtual Visit via Video Group Note  I connected with Mark [name] on 11/19/22 at 5:00 pm EST by a video enabled telemedicine application and verified that I am speaking with the correct person using two identifiers.   I discussed the limitations of evaluation and management by telemedicine and the availability of in person appointments. The patient expressed understanding and agreed to proceed.  LOCATION: Patient: Home Provider: home Office  History of Present Illness: [facility], depression, [facility].  Treatment Goal Addressed:  Pt will develop the ability to recognize, accept, and cope with feelings of depression AEB self report.  Progress towards Goal:  Progressing    Observation/Objective:   Patient participated in a group discussion on introspection, looking inward, self-examination, reflection, and inspection of thoughts. Patient described self-reflection to describe his reflective thoughts. Patient was encouraged to continue using introspection.      Assessment and Plan:  Pt participated in a group discussion on family dysfunction. Clinician assessed patient's family dynamics and discussed how the dynamics influence mental health symptoms in a negative way. Patient was encouraged to work to improve family function.      Follow-up instructions:I discussed the assessment and treatment plan with the patient. Patient was provided an opportunity to ask questions and all were answered. The patient agreed with the plan and demonstrated an understanding of the instructions.   The patient was advised to call back or seek an in-person evaluation if the symptoms worsen or if the condition fails to improve as anticipated.  Collaboration of Care: Other: Continue to see providers at [facility]  Patient/Guardian was advised Release of Information must be obtained prior to any record release in order to collaborate their care with an outside  provider. Patient/Guardian was advised if they have not already done so to contact the registration department to sign all necessary forms in order for [name] to release information regarding their care.   Consent: Patient/Guardian gives verbal consent for treatment and assignment of benefits for services provided during this visit. Patient/Guardian expressed understanding and agreed to proceed.      I provided 90 minutes of non-face-to-face time during this encounter.   Mark [name] S, LCAS              11/19/22

## 2022-11-21 ENCOUNTER — Encounter (HOSPITAL_COMMUNITY): Payer: Self-pay | Admitting: Licensed Clinical Social Worker

## 2022-11-21 ENCOUNTER — Ambulatory Visit (HOSPITAL_COMMUNITY): Payer: No Typology Code available for payment source | Admitting: Licensed Clinical Social Worker

## 2022-11-21 DIAGNOSIS — F4310 Post-traumatic stress disorder, unspecified: Secondary | ICD-10-CM | POA: Diagnosis not present

## 2022-11-21 DIAGNOSIS — F411 Generalized anxiety disorder: Secondary | ICD-10-CM | POA: Diagnosis not present

## 2022-11-21 DIAGNOSIS — F331 Major depressive disorder, recurrent, moderate: Secondary | ICD-10-CM

## 2022-11-21 NOTE — Progress Notes (Signed)
Virtual Visit via Video Note  I connected with Mark [name] on 11/15/22 at 12-1pm EST by video-enabled telemedicine with the correct person using two identifiers.   I discussed the limitations of evaluation and management by telemedicine and the availability of in person appointments. The patient expressed understanding and agreed to proceed.  LOCATION Patient: home Provider: home office    History of Present Illness: Pt was referred to [facility] OP therapy for PTSD, depression and anxiety by the [facility].  Treatment Goal Addressed:   Pt will reduce irritability and increase normal social interaction with family and friends and will develop the ability to recognize, accept, and cope with feelings of depression and anxiety by using practiced coping skills for depression 1-2x daily AEB self report.  Progress towards Goal:  Progressing    Observations/Objective: Pt presented for today's session on time and was alert, oriented x5, with no evidence or self-report of SI/HI or A/V H.  Patient reported ongoing compliance with medication and denied any use of alcohol or illicit substances.  Clinician inquired about patient's current emotional ratings, as well as any significant changes in thoughts, feelings or behavior since previous session. Patient reported scores of 7/10 for depression, 8/10 for anxiety, 4/10 for anger/irritability, and denied any reoccurrence of panic attacks.  Pt identified stressors that occurred in his life over the past week. Cln and pt reviewed each stressor and coping skills used over the past week: grounding and breathing exercises:  packing up house, moving back to [place], moving back to toxic environment, giving away ESA,  financial issues, chronic pain, pain in back due to blisters, (Will eventually need surgery),amputee disability. "I ended up in the ED at [facility] for swelling and blisters that have burst on my back.I stayed in the Ed for over 12 hours, sent home with new meds."  Cln asked open ended questions about next steps for his health. Cln used CBT to assist in identifying positives to assess how his mood has been impacted. Clinician allowed space for patient to further process emotions and clinician provided supportive statements throughout session.      Collaboration of Care: Other: Continue seeing providers at [place].    Patient/Guardian was advised Release of Information must be obtained prior to any record release in order to collaborate their care with an outside provider. Patient/Guardian was advised if they have not already done so to contact the registration department to sign all necessary forms in order for [name] to release information regarding their care.    Consent: Patient/Guardian gives verbal consent for treatment and assignment of benefits for services provided during this visit. Patient/Guardian expressed understanding and agreed to proceed.     Assessment and Plan: Counselor will continue to meet with patient and address treatment plan goals. Patient was given a chance for recommendations of providers and implement skills learned in session and encouraged to practice coping skills between sessions, practicing between sessions.Diagnosis: anxiety, depression and ptsd  Follow Up Instructions I discussed the assessment and treatment plan with the patient. The patient was provided an opportunity to ask questions and all were answered. The patient agreed with the plan and demonstrated an understanding of the instructions.   The patient was advised to call back or seek an in-person evaluation if the symptoms worsen or if the condition fails to improve as anticipated.  I provided 60 minutes of non-face-to-face time during this encounter.   Lynnette Pote S, LCAS  .

## 2022-11-21 NOTE — Progress Notes (Signed)
Virtual Visit via Video Note  I connected with Mark [name] on 11/21/22 at 3-4pm EST by video-enabled telemedicine with the correct person using two identifiers.   I discussed the limitations of evaluation and management by telemedicine and the availability of in person appointments. The patient expressed understanding and agreed to proceed.  LOCATION Patient: home Provider: home office    History of Present Illness: Pt was referred to [facility] OP therapy for PTSD, depression and anxiety by the [facility].  Treatment Goal Addressed:   Pt will reduce irritability and increase normal social interaction with family and friends and will develop the ability to recognize, accept, and cope with feelings of depression and anxiety by using practiced coping skills for depression 1-2x daily AEB self report.  Progress towards Goal:  Progressing    Observations/Objective: Pt presented for today's session on time and was alert, oriented x5, with no evidence or self-report of SI/HI or A/V H.  Patient reported ongoing compliance with medication and denied any use of alcohol or illicit substances.  Clinician inquired about patient's current emotional ratings, as well as any significant changes in thoughts, feelings or behavior since previous session. Patient reported scores of 7/10 for depression, 8/10 for anxiety, 4/10 for anger/irritability, and denied any reoccurrence of panic attacks.  Pt identified stressors that occurred in his life over the past week. Cln and pt reviewed each stressor and coping skills used over the past week: grounding and breathing exercises:  packing up house, moving back to [place], moving back to toxic environment, giving away ESA,  financial issues, chronic pain, $ for car repair, pain in back due to blisters, (Will eventually need surgery),amputee disability. "Im very stressed today because I found out that my car needs more work which is putting me behind in moving back to [place]." Cln  asked open ended questions. Cln provided psychoeducation on using mindfulness-based stress response to assist him in lowering the stress response. Patient was encouraged to use mindfulness skills as a coping skill to assist in lowering his stress response.     Collaboration of Care: Other: Continue seeing providers at [place].    Patient/Guardian was advised Release of Information must be obtained prior to any record release in order to collaborate their care with an outside provider. Patient/Guardian was advised if they have not already done so to contact the registration department to sign all necessary forms in order for [name] to release information regarding their care.    Consent: Patient/Guardian gives verbal consent for treatment and assignment of benefits for services provided during this visit. Patient/Guardian expressed understanding and agreed to proceed.     Assessment and Plan: Counselor will continue to meet with patient and address treatment plan goals. Patient was given a chance for recommendations of providers and implement skills learned in session and encouraged to practice coping skills between sessions, practicing between sessions.Diagnosis: anxiety, depression and ptsd  Follow Up Instructions I discussed the assessment and treatment plan with the patient. The patient was provided an opportunity to ask questions and all were answered. The patient agreed with the plan and demonstrated an understanding of the instructions.   The patient was advised to call back or seek an in-person evaluation if the symptoms worsen or if the condition fails to improve as anticipated.  I provided 60 minutes of non-face-to-face time during this encounter.   Mark [name] S, LCAS  .

## 2022-11-26 ENCOUNTER — Encounter (HOSPITAL_COMMUNITY): Payer: Self-pay | Admitting: Licensed Clinical Social Worker

## 2022-11-26 ENCOUNTER — Ambulatory Visit (HOSPITAL_COMMUNITY): Payer: No Typology Code available for payment source | Admitting: Licensed Clinical Social Worker

## 2022-11-26 DIAGNOSIS — F4310 Post-traumatic stress disorder, unspecified: Secondary | ICD-10-CM

## 2022-11-26 DIAGNOSIS — F419 Anxiety disorder, unspecified: Secondary | ICD-10-CM

## 2022-11-26 DIAGNOSIS — F331 Major depressive disorder, recurrent, moderate: Secondary | ICD-10-CM

## 2022-11-26 DIAGNOSIS — F411 Generalized anxiety disorder: Secondary | ICD-10-CM

## 2022-11-26 DIAGNOSIS — F32A Depression, unspecified: Secondary | ICD-10-CM

## 2022-11-26 NOTE — Progress Notes (Signed)
Virtual Visit via Video Group Note  I connected with Mark [name] on 11/26/22 at 5:00 pm EST by a video enabled telemedicine application and verified that I am speaking with the correct person using two identifiers.   I discussed the limitations of evaluation and management by telemedicine and the availability of in person appointments. The patient expressed understanding and agreed to proceed.  LOCATION: Patient: Home Provider: home Office  History of Present Illness: [facility], depression, anxiety.  Treatment Goal Addressed:  Pt will develop the ability to recognize, accept, and cope with feelings of depression AEB self report.  Progress towards Goal:  Progressing    Observation/Objective:   Pt participated in a group discussion about being productive even when physical health is poor. Cln used CBT to help pt process thoughts, feelings, and behaviors. Cln provided supportive feedback.   Assessment and Plan:  Counselor will continue to meet with patient to address treatment plan goals. Patient will continue to follow recommendations of providers and implement skills learned in session.    Follow-up instructions:I discussed the assessment and treatment plan with the patient. Patient was provided an opportunity to ask questions and all were answered. The patient agreed with the plan and demonstrated an understanding of the instructions.   The patient was advised to call back or seek an in-person evaluation if the symptoms worsen or if the condition fails to improve as anticipated.  Collaboration of Care: Other: Continue to see providers at [facility]  Patient/Guardian was advised Release of Information must be obtained prior to any record release in order to collaborate their care with an outside provider. Patient/Guardian was advised if they have not already done so to contact the registration department to sign all necessary forms in  order for [name] to release information regarding their care.   Consent: Patient/Guardian gives verbal consent for treatment and assignment of benefits for services provided during this visit. Patient/Guardian expressed understanding and agreed to proceed.      I provided 90 minutes of non-face-to-face time during this encounter.   Candelaria Pies S, LCAS              11/26/22

## 2022-11-29 ENCOUNTER — Ambulatory Visit (INDEPENDENT_AMBULATORY_CARE_PROVIDER_SITE_OTHER): Payer: No Typology Code available for payment source | Admitting: Licensed Clinical Social Worker

## 2022-11-29 DIAGNOSIS — F419 Anxiety disorder, unspecified: Secondary | ICD-10-CM | POA: Diagnosis not present

## 2022-11-29 DIAGNOSIS — F411 Generalized anxiety disorder: Secondary | ICD-10-CM

## 2022-11-29 DIAGNOSIS — F32A Depression, unspecified: Secondary | ICD-10-CM

## 2022-11-29 DIAGNOSIS — F4310 Post-traumatic stress disorder, unspecified: Secondary | ICD-10-CM | POA: Diagnosis not present

## 2022-11-29 DIAGNOSIS — F331 Major depressive disorder, recurrent, moderate: Secondary | ICD-10-CM

## 2022-12-03 ENCOUNTER — Ambulatory Visit (HOSPITAL_COMMUNITY): Payer: Medicare HMO | Admitting: Licensed Clinical Social Worker

## 2022-12-06 ENCOUNTER — Ambulatory Visit (HOSPITAL_COMMUNITY): Payer: No Typology Code available for payment source | Admitting: Licensed Clinical Social Worker

## 2022-12-06 DIAGNOSIS — F331 Major depressive disorder, recurrent, moderate: Secondary | ICD-10-CM

## 2022-12-06 DIAGNOSIS — F4310 Post-traumatic stress disorder, unspecified: Secondary | ICD-10-CM

## 2022-12-06 DIAGNOSIS — F411 Generalized anxiety disorder: Secondary | ICD-10-CM | POA: Diagnosis not present

## 2022-12-10 ENCOUNTER — Ambulatory Visit (INDEPENDENT_AMBULATORY_CARE_PROVIDER_SITE_OTHER): Payer: No Typology Code available for payment source | Admitting: Licensed Clinical Social Worker

## 2022-12-10 DIAGNOSIS — F411 Generalized anxiety disorder: Secondary | ICD-10-CM

## 2022-12-10 DIAGNOSIS — F4310 Post-traumatic stress disorder, unspecified: Secondary | ICD-10-CM | POA: Diagnosis not present

## 2022-12-10 DIAGNOSIS — F331 Major depressive disorder, recurrent, moderate: Secondary | ICD-10-CM

## 2022-12-10 DIAGNOSIS — F32A Depression, unspecified: Secondary | ICD-10-CM | POA: Diagnosis not present

## 2022-12-13 ENCOUNTER — Ambulatory Visit (INDEPENDENT_AMBULATORY_CARE_PROVIDER_SITE_OTHER): Payer: No Typology Code available for payment source | Admitting: Licensed Clinical Social Worker

## 2022-12-13 DIAGNOSIS — F331 Major depressive disorder, recurrent, moderate: Secondary | ICD-10-CM

## 2022-12-13 DIAGNOSIS — F4310 Post-traumatic stress disorder, unspecified: Secondary | ICD-10-CM

## 2022-12-13 DIAGNOSIS — F411 Generalized anxiety disorder: Secondary | ICD-10-CM

## 2022-12-16 ENCOUNTER — Encounter (HOSPITAL_COMMUNITY): Payer: Self-pay | Admitting: Licensed Clinical Social Worker

## 2022-12-16 NOTE — Progress Notes (Signed)
Virtual Visit via Video Note  I connected with Mark [name] on 11/29/22 at 3-4pm EST by video-enabled telemedicine with the correct person using two identifiers.   I discussed the limitations of evaluation and management by telemedicine and the availability of in person appointments. The patient expressed understanding and agreed to proceed.  LOCATION Patient: home Provider: home office    History of Present Illness: Pt was referred to [facility] OP therapy for PTSD, depression and anxiety by the [facility].  Treatment Goal Addressed:   Pt will reduce irritability and increase normal social interaction with family and friends and will develop the ability to recognize, accept, and cope with feelings of depression and anxiety by using practiced coping skills for depression 1-2x daily AEB self report.  Progress towards Goal:  Progressing    Observations/Objective: Pt presented for today's session on time and was alert, oriented x5, with no evidence or self-report of SI/HI or A/V H.  Patient reported ongoing compliance with medication and denied any use of alcohol or illicit substances.  Clinician inquired about patient's current emotional ratings, as well as any significant changes in thoughts, feelings or behavior since previous session. Patient reported scores of 7/10 for depression, 8/10 for anxiety, 4/10 for anger/irritability, and denied any reoccurrence of panic attacks.  Pt identified stressors that occurred in his life over the past week. Cln and pt reviewed each stressor and coping skills used over the past week: grounding and breathing exercises:  packing up house, moving back to [place], moving back to toxic environment, giving away ESA,  financial issues, chronic pain, $ for car repair, pain in back due to blisters, (Will eventually need surgery),amputee disability.  Next Monday is veteran's day. 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 Cln allowed  pt to discuss his time serving in the [employer]. Cln thanked veteran for his service.Marland Kitchen      Collaboration of Care: Other: Continue seeing providers at [place].    Patient/Guardian was advised Release of Information must be obtained prior to any record release in order to collaborate their care with an outside provider. Patient/Guardian was advised if they have not already done so to contact the registration department to sign all necessary forms in order for [name] to release information regarding their care.    Consent: Patient/Guardian gives verbal consent for treatment and assignment of benefits for services provided during this visit. Patient/Guardian expressed understanding and agreed to proceed.     Assessment and Plan: Counselor will continue to meet with patient and address treatment plan goals. Patient was given a chance for recommendations of providers and implement skills learned in session and encouraged to practice coping skills between sessions, practicing between sessions.Diagnosis: anxiety, depression and ptsd  Follow Up Instructions I discussed the assessment and treatment plan with the patient. The patient was provided an opportunity to ask questions and all were answered. The patient agreed with the plan and demonstrated an understanding of the instructions.   The patient was advised to call back or seek an in-person evaluation if the symptoms worsen or if the condition fails to improve as anticipated.  I provided 60 minutes of non-face-to-face time during this encounter.   Mark [name] S, LCAS  .

## 2022-12-17 ENCOUNTER — Ambulatory Visit (HOSPITAL_COMMUNITY): Payer: No Typology Code available for payment source | Admitting: Licensed Clinical Social Worker

## 2022-12-17 ENCOUNTER — Encounter (HOSPITAL_COMMUNITY): Payer: Self-pay | Admitting: Licensed Clinical Social Worker

## 2022-12-17 DIAGNOSIS — F32A Depression, unspecified: Secondary | ICD-10-CM | POA: Diagnosis not present

## 2022-12-17 DIAGNOSIS — F411 Generalized anxiety disorder: Secondary | ICD-10-CM | POA: Diagnosis not present

## 2022-12-17 DIAGNOSIS — F4310 Post-traumatic stress disorder, unspecified: Secondary | ICD-10-CM

## 2022-12-17 DIAGNOSIS — F331 Major depressive disorder, recurrent, moderate: Secondary | ICD-10-CM

## 2022-12-17 NOTE — Progress Notes (Signed)
Virtual Visit via Video Group Note  I connected with Mark [name] on 12/17/22 at 5:00 pm EST by a video enabled telemedicine application and verified that I am speaking with the correct person using two identifiers.   I discussed the limitations of evaluation and management by telemedicine and the availability of in person appointments. The patient expressed understanding and agreed to proceed.  LOCATION: Patient: Home Provider: home Office  History of Present Illness: [facility]  Treatment Goal Addressed:  Pt will develop the ability to recognize, accept, and cope with feelings of depression AEB self report.  Progress towards Goal:  Progressing    Observation/Objective:   Patient participated in a group discussion on celebrating the holidays while serving in the [employer]. Patient described his holidays and coping skills used to deal with feelings surrounding the holidays. Clinician congratulated patient on coping skills used and tolerating the holidays with or without family.     Assessment and Plan:  Counselor will continue to meet with patient to address treatment plan goals. Patient will continue to follow recommendations of providers and implement skills learned in session.    Follow-up instructions:I discussed the assessment and treatment plan with the patient. Patient was provided an opportunity to ask questions and all were answered. The patient agreed with the plan and demonstrated an understanding of the instructions.   The patient was advised to call back or seek an in-person evaluation if the symptoms worsen or if the condition fails to improve as anticipated.  Collaboration of Care: Other: Continue to see providers at [facility]  Patient/Guardian was advised Release of Information must be obtained prior to any record release in order to collaborate their care with an outside provider. Patient/Guardian  was advised if they have not already done so to contact the registration department to sign all necessary forms in order for [name] to release information regarding their care.   Consent: Patient/Guardian gives verbal consent for treatment and assignment of benefits for services provided during this visit. Patient/Guardian expressed understanding and agreed to proceed.      I provided 90 minutes of non-face-to-face time during this encounter.   Shiron Whetsel S, LCAS              12/17/22

## 2022-12-20 ENCOUNTER — Encounter (HOSPITAL_COMMUNITY): Payer: Self-pay | Admitting: Licensed Clinical Social Worker

## 2022-12-20 NOTE — Progress Notes (Signed)
Virtual Visit via Video Note  I connected with Mark [name] on 12/06/22 at 11-12pm EST by video-enabled telemedicine with the correct person using two identifiers.   I discussed the limitations of evaluation and management by telemedicine and the availability of in person appointments. The patient expressed understanding and agreed to proceed.  LOCATION Patient: home Provider: home office    History of Present Illness: Pt was referred to [facility] OP therapy for PTSD, depression and anxiety by the [facility].  Treatment Goal Addressed:   Pt will reduce irritability and increase normal social interaction with family and friends and will develop the ability to recognize, accept, and cope with feelings of depression and anxiety by using practiced coping skills for depression 1-2x daily AEB self report.  Progress towards Goal:  Progressing    Observations/Objective: Pt presented for today's session on time and was alert, oriented x5, with no evidence or self-report of SI/HI or A/V H.  Patient reported ongoing compliance with medication and denied any use of alcohol or illicit substances.  Clinician inquired about patient's current emotional ratings, as well as any significant changes in thoughts, feelings or behavior since previous session. Patient reported scores of 7/10 for depression, 8/10 for anxiety, 4/10 for anger/irritability, and denied any reoccurrence of panic attacks.  Pt identified stressors that occurred in his life over the past week. Cln and pt reviewed each stressor and coping skills used over the past week: grounding and breathing exercises:  packing up house, moving back to [place], moving back to toxic environment, giving away ESA,  financial issues, chronic pain, $ for car repair, pain in back due to blisters, (Will eventually need surgery),amputee disability. Monday was veteran's day. 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 Cln allowed pt to  discuss his time  in the [employer]. Cln thanked him for his service.         Collaboration of Care: Other: Continue seeing providers at [place].    Patient/Guardian was advised Release of Information must be obtained prior to any record release in order to collaborate their care with an outside provider. Patient/Guardian was advised if they have not already done so to contact the registration department to sign all necessary forms in order for [name] to release information regarding their care.    Consent: Patient/Guardian gives verbal consent for treatment and assignment of benefits for services provided during this visit. Patient/Guardian expressed understanding and agreed to proceed.     Assessment and Plan: Counselor will continue to meet with patient and address treatment plan goals. Patient was given a chance for recommendations of providers and implement skills learned in session and encouraged to practice coping skills between sessions, practicing between sessions.Diagnosis: anxiety, depression and ptsd  Follow Up Instructions I discussed the assessment and treatment plan with the patient. The patient was provided an opportunity to ask questions and all were answered. The patient agreed with the plan and demonstrated an understanding of the instructions.   The patient was advised to call back or seek an in-person evaluation if the symptoms worsen or if the condition fails to improve as anticipated.  I provided 60 minutes of non-face-to-face time during this encounter.   Chelse Matas S, LCAS  .

## 2022-12-21 ENCOUNTER — Encounter (HOSPITAL_COMMUNITY): Payer: Self-pay | Admitting: Licensed Clinical Social Worker

## 2022-12-21 NOTE — Progress Notes (Signed)
Virtual Visit via Video Group Note  I connected with Mark [name] on 12/10/22 at 5:00 pm EST by a video enabled telemedicine application and verified that I am speaking with the correct person using two identifiers.   I discussed the limitations of evaluation and management by telemedicine and the availability of in person appointments. The patient expressed understanding and agreed to proceed.  LOCATION: Patient: Home Provider: home Office  History of Present Illness: [facility], [facility], [facility].  Treatment Goal Addressed:  Pt will develop the ability to recognize, accept, and cope with feelings of depression AEB self report.  Progress towards Goal:  Progressing    Observation/Objective:   .Patient participated in a discussion on relationships (spousal, parent/child, intimate, partnership) where the group discussed effective communication, I-statements and fair fighting. Pt was encouraged to work on his current relationship using effective communication including I statements and fair fighting rules.    Assessment and Plan:  Counselor will continue to meet with patient to address treatment plan goals. Patient will continue to follow recommendations of providers and implement skills learned in session.    Follow-up instructions:I discussed the assessment and treatment plan with the patient. Patient was provided an opportunity to ask questions and all were answered. The patient agreed with the plan and demonstrated an understanding of the instructions.   The patient was advised to call back or seek an in-person evaluation if the symptoms worsen or if the condition fails to improve as anticipated.  Collaboration of Care: Other: Continue to see providers at [facility]  Patient/Guardian was advised Release of Information must be obtained prior to any record release in order to collaborate their care with an outside provider.  Patient/Guardian was advised if they have not already done so to contact the registration department to sign all necessary forms in order for [name] to release information regarding their care.   Consent: Patient/Guardian gives verbal consent for treatment and assignment of benefits for services provided during this visit. Patient/Guardian expressed understanding and agreed to proceed.      I provided 90 minutes of non-face-to-face time during this encounter.   Mark [name], LCAS              12/10/22

## 2022-12-22 NOTE — Progress Notes (Signed)
Virtual Visit via Video Note  I connected with Mark [name] on 12/13/22 at 11-12pm EST by video-enabled telemedicine with the correct person using two identifiers.   I discussed the limitations of evaluation and management by telemedicine and the availability of in person appointments. The patient expressed understanding and agreed to proceed.  LOCATION Patient: home Provider: home office    History of Present Illness: Pt was referred to [facility] OP therapy for PTSD, depression and anxiety by the [facility]  Treatment Goal Addressed:   Pt will reduce irritability and increase normal social interaction with family and friends and will develop the ability to recognize, accept, and cope with feelings of depression and anxiety by using practiced coping skills for depression 1-2x daily AEB self report.  Progress towards Goal:  Progressing    Observations/Objective: Pt presented for today's session on time and was alert, oriented x5, with no evidence or self-report of SI/HI or A/V H.  Patient reported ongoing compliance with medication and denied any use of alcohol or illicit substances.  Clinician inquired about patient's current emotional ratings, as well as any significant changes in thoughts, feelings or behavior since previous session. Patient reported scores of 7/10 for depression, 8/10 for anxiety, 4/10 for anger/irritability, and denied any reoccurrence of panic attacks.  Pt identified stressors that occurred in his life over the past week. Cln and pt reviewed each stressor and coping skills used over the past week: grounding and breathing exercises:  packing up house, moving back to [place], moving back to toxic environment, giving away ESA,  financial issues, chronic pain, $ for car repair, pain in back due to blisters, (Will eventually need surgery),amputee disability. Pt reports, "I'm not going home for the holidays." Cln asked open ended questions. "It's too much to drive to [place], drive  back to [place] and then drive to [place] for business meeting." Cln used CBT to help pt understand the rationale for first improving awareness and understanding of factors triggering and influencing emotional reactions and behaviors. Cln and pt reviewed alternatives for travel for the holidays.         Collaboration of Care: Other: Continue seeing providers at [place].    Patient/Guardian was advised Release of Information must be obtained prior to any record release in order to collaborate their care with an outside provider. Patient/Guardian was advised if they have not already done so to contact the registration department to sign all necessary forms in order for [name] to release information regarding their care.    Consent: Patient/Guardian gives verbal consent for treatment and assignment of benefits for services provided during this visit. Patient/Guardian expressed understanding and agreed to proceed.     Assessment and Plan: Counselor will continue to meet with patient and address treatment plan goals. Patient was given a chance for recommendations of providers and implement skills learned in session and encouraged to practice coping skills between sessions, practicing between sessions.Diagnosis: anxiety, depression and ptsd  Follow Up Instructions I discussed the assessment and treatment plan with the patient. The patient was provided an opportunity to ask questions and all were answered. The patient agreed with the plan and demonstrated an understanding of the instructions.   The patient was advised to call back or seek an in-person evaluation if the symptoms worsen or if the condition fails to improve as anticipated.  I provided 60 minutes of non-face-to-face time during this encounter.   Tiarrah Saville S, LCAS  .

## 2022-12-24 ENCOUNTER — Ambulatory Visit (INDEPENDENT_AMBULATORY_CARE_PROVIDER_SITE_OTHER): Payer: No Typology Code available for payment source | Admitting: Licensed Clinical Social Worker

## 2022-12-24 DIAGNOSIS — F4310 Post-traumatic stress disorder, unspecified: Secondary | ICD-10-CM

## 2022-12-24 DIAGNOSIS — F411 Generalized anxiety disorder: Secondary | ICD-10-CM

## 2022-12-24 DIAGNOSIS — F419 Anxiety disorder, unspecified: Secondary | ICD-10-CM

## 2022-12-24 DIAGNOSIS — F32A Depression, unspecified: Secondary | ICD-10-CM | POA: Diagnosis not present

## 2022-12-24 DIAGNOSIS — F331 Major depressive disorder, recurrent, moderate: Secondary | ICD-10-CM

## 2022-12-27 ENCOUNTER — Ambulatory Visit (HOSPITAL_COMMUNITY): Payer: No Typology Code available for payment source | Admitting: Licensed Clinical Social Worker

## 2022-12-27 DIAGNOSIS — F411 Generalized anxiety disorder: Secondary | ICD-10-CM

## 2022-12-27 DIAGNOSIS — F331 Major depressive disorder, recurrent, moderate: Secondary | ICD-10-CM

## 2022-12-27 DIAGNOSIS — F4310 Post-traumatic stress disorder, unspecified: Secondary | ICD-10-CM

## 2022-12-31 ENCOUNTER — Encounter (HOSPITAL_COMMUNITY): Payer: Self-pay | Admitting: Licensed Clinical Social Worker

## 2022-12-31 ENCOUNTER — Ambulatory Visit (INDEPENDENT_AMBULATORY_CARE_PROVIDER_SITE_OTHER): Payer: No Typology Code available for payment source | Admitting: Licensed Clinical Social Worker

## 2022-12-31 DIAGNOSIS — F4310 Post-traumatic stress disorder, unspecified: Secondary | ICD-10-CM | POA: Diagnosis not present

## 2022-12-31 DIAGNOSIS — F411 Generalized anxiety disorder: Secondary | ICD-10-CM

## 2022-12-31 DIAGNOSIS — F331 Major depressive disorder, recurrent, moderate: Secondary | ICD-10-CM

## 2022-12-31 DIAGNOSIS — F32A Depression, unspecified: Secondary | ICD-10-CM

## 2022-12-31 DIAGNOSIS — F419 Anxiety disorder, unspecified: Secondary | ICD-10-CM

## 2022-12-31 NOTE — Progress Notes (Signed)
Virtual Visit via Video Group Note  I connected with Mark [name] on 12/24/22 at 5:00 pm EST by a video enabled telemedicine application and verified that I am speaking with the correct person using two identifiers.   I discussed the limitations of evaluation and management by telemedicine and the availability of in person appointments. The patient expressed understanding and agreed to proceed.  LOCATION: Patient: Home Provider: home Office  History of Present Illness: [facility], [facility], [facility].  Treatment Goal Addressed:  Pt will develop the ability to recognize, accept, and cope with feelings of depression AEB self report.  Progress towards Goal:  Progressing    Observation/Objective:    Patient participated in a group discussion on Thanksgiving celebration and gratitudes. Pt shared current gratitudes. Pt was encouraged to identify gratitudes daily.     Assessment and Plan:  Counselor will continue to meet with patient to address treatment plan goals. Patient will continue to follow recommendations of providers and implement skills learned in session.    Follow-up instructions:I discussed the assessment and treatment plan with the patient. Patient was provided an opportunity to ask questions and all were answered. The patient agreed with the plan and demonstrated an understanding of the instructions.   The patient was advised to call back or seek an in-person evaluation if the symptoms worsen or if the condition fails to improve as anticipated.  Collaboration of Care: Other: Continue to see providers at [facility]  Patient/Guardian was advised Release of Information must be obtained prior to any record release in order to collaborate their care with an outside provider. Patient/Guardian was advised if they have not already done so to contact the registration department to sign all necessary forms in order for [name] to release  information regarding their care.   Consent: Patient/Guardian gives verbal consent for treatment and assignment of benefits for services provided during this visit. Patient/Guardian expressed understanding and agreed to proceed.      I provided 90 minutes of non-face-to-face time during this encounter.   Doniel Maiello S, LCAS              12/24/22

## 2022-12-31 NOTE — Progress Notes (Signed)
Virtual Visit via Video Group Note  I connected with Mark [name] on 12/31/22 at 5:00 pm EST by a video enabled telemedicine application and verified that I am speaking with the correct person using two identifiers.   I discussed the limitations of evaluation and management by telemedicine and the availability of in person appointments. The patient expressed understanding and agreed to proceed.  LOCATION: Patient: Home Provider: home Office  History of Present Illness: [facility], depression, anxiety.  Treatment Goal Addressed:  Pt will develop the ability to recognize, accept, and cope with feelings of depression AEB self report.  Progress towards Goal:  Progressing    Observation/Objective:    Patient participated in a group discussion on "tolerating the holidays" with family members. Patient described his holiday plans and coping skills to deal with feelings surrounding the holidays. Patient was encouraged to express his feelings in positive ways.  Assessment and Plan:  Counselor will continue to meet with patient to address treatment plan goals. Patient will continue to follow recommendations of providers and implement skills learned in session.    Follow-up instructions:I discussed the assessment and treatment plan with the patient. Patient was provided an opportunity to ask questions and all were answered. The patient agreed with the plan and demonstrated an understanding of the instructions.   The patient was advised to call back or seek an in-person evaluation if the symptoms worsen or if the condition fails to improve as anticipated.  Collaboration of Care: Other: Continue to see providers at [facility]  Patient/Guardian was advised Release of Information must be obtained prior to any record release in order to collaborate their care with an outside provider. Patient/Guardian was advised if they have not already done so to contact  the registration department to sign all necessary forms in order for [name] to release information regarding their care.   Consent: Patient/Guardian gives verbal consent for treatment and assignment of benefits for services provided during this visit. Patient/Guardian expressed understanding and agreed to proceed.      I provided 90 minutes of non-face-to-face time during this encounter.   Camrynn Mcclintic S, LCAS              12/31/22

## 2023-01-03 ENCOUNTER — Encounter (HOSPITAL_COMMUNITY): Payer: Self-pay | Admitting: Licensed Clinical Social Worker

## 2023-01-03 ENCOUNTER — Ambulatory Visit (INDEPENDENT_AMBULATORY_CARE_PROVIDER_SITE_OTHER): Payer: No Typology Code available for payment source | Admitting: Licensed Clinical Social Worker

## 2023-01-03 DIAGNOSIS — F411 Generalized anxiety disorder: Secondary | ICD-10-CM

## 2023-01-03 DIAGNOSIS — F4310 Post-traumatic stress disorder, unspecified: Secondary | ICD-10-CM

## 2023-01-03 DIAGNOSIS — F331 Major depressive disorder, recurrent, moderate: Secondary | ICD-10-CM | POA: Diagnosis not present

## 2023-01-03 NOTE — Progress Notes (Signed)
Virtual Visit via Video Note  I connected with Mark [name] on 01/09/23 at 11-12pm EST by video-enabled telemedicine with the correct person using two identifiers.   I discussed the limitations of evaluation and management by telemedicine and the availability of in person appointments. The patient expressed understanding and agreed to proceed.  LOCATION Patient: home Provider: home office    History of Present Illness: Pt was referred to [facility] OP therapy for PTSD, depression and anxiety by the [facility].  Treatment Goal Addressed:   Pt will reduce irritability and increase normal social interaction with family and friends and will develop the ability to recognize, accept, and cope with feelings of depression and anxiety by using practiced coping skills for depression 1-2x daily AEB self report.  Progress towards Goal:  Progressing    Observations/Objective: Pt presented for today's session on time and was alert, oriented x5, with no evidence or self-report of SI/HI or A/V H.  Patient reported ongoing compliance with medication and denied any use of alcohol or illicit substances.  Clinician inquired about patient's current emotional ratings, as well as any significant changes in thoughts, feelings or behavior since previous session. Patient reported scores of 7/10 for depression, 8/10 for anxiety, 4/10 for anger/irritability, and denied any reoccurrence of panic attacks.  Pt identified stressors that occurred in his life over the past week. Cln and pt reviewed each stressor and coping skills used over the past week: grounding and breathing exercises:  packing up house, moving back to [place], moving back to toxic environment, giving away ESA,  financial issues, chronic pain, $ for car repair, pain in back due to blisters, (Will eventually need surgery),amputee disability. Pt reports, "I' didn't go home for Thanksgiving but I'm going for Christmas." Cln asked open ended questions about spending  Thanksgiving at home alone and upcoming Christmas vacation. "I am just returning from [place] work conference, where my son drove me down there." Cln asked open ended questions. Clinician utilized CBT to process concerns about going back to [place] for holidays. Clinician processed thoughts, feelings, and behaviors. Clinician discussed coping skills and options for supporting himself through these challenging times.   Marland Kitchen       Collaboration of Care: Other: Continue seeing providers at [place].    Patient/Guardian was advised Release of Information must be obtained prior to any record release in order to collaborate their care with an outside provider. Patient/Guardian was advised if they have not already done so to contact the registration department to sign all necessary forms in order for [name] to release information regarding their care.    Consent: Patient/Guardian gives verbal consent for treatment and assignment of benefits for services provided during this visit. Patient/Guardian expressed understanding and agreed to proceed.     Assessment and Plan: Counselor will continue to meet with patient and address treatment plan goals. Patient was given a chance for recommendations of providers and implement skills learned in session and encouraged to practice coping skills between sessions, practicing between sessions.Diagnosis: anxiety, depression and ptsd  Follow Up Instructions I discussed the assessment and treatment plan with the patient. The patient was provided an opportunity to ask questions and all were answered. The patient agreed with the plan and demonstrated an understanding of the instructions.   The patient was advised to call back or seek an in-person evaluation if the symptoms worsen or if the condition fails to improve as anticipated.  I provided 60 minutes of non-face-to-face time during this encounter.  [name], LCAS  .

## 2023-01-07 ENCOUNTER — Ambulatory Visit (HOSPITAL_COMMUNITY): Payer: No Typology Code available for payment source | Admitting: Licensed Clinical Social Worker

## 2023-01-07 DIAGNOSIS — F4310 Post-traumatic stress disorder, unspecified: Secondary | ICD-10-CM

## 2023-01-07 DIAGNOSIS — F411 Generalized anxiety disorder: Secondary | ICD-10-CM

## 2023-01-07 DIAGNOSIS — F32A Depression, unspecified: Secondary | ICD-10-CM | POA: Diagnosis not present

## 2023-01-07 DIAGNOSIS — F419 Anxiety disorder, unspecified: Secondary | ICD-10-CM

## 2023-01-07 DIAGNOSIS — F331 Major depressive disorder, recurrent, moderate: Secondary | ICD-10-CM

## 2023-01-10 ENCOUNTER — Ambulatory Visit (HOSPITAL_COMMUNITY): Payer: No Typology Code available for payment source | Admitting: Licensed Clinical Social Worker

## 2023-01-14 ENCOUNTER — Ambulatory Visit (HOSPITAL_COMMUNITY): Payer: No Typology Code available for payment source | Admitting: Licensed Clinical Social Worker

## 2023-01-17 ENCOUNTER — Encounter (HOSPITAL_COMMUNITY): Payer: Self-pay

## 2023-01-17 ENCOUNTER — Ambulatory Visit (HOSPITAL_COMMUNITY): Payer: No Typology Code available for payment source | Admitting: Licensed Clinical Social Worker

## 2023-01-21 ENCOUNTER — Encounter (HOSPITAL_COMMUNITY): Payer: Self-pay | Admitting: Licensed Clinical Social Worker

## 2023-01-21 ENCOUNTER — Ambulatory Visit (INDEPENDENT_AMBULATORY_CARE_PROVIDER_SITE_OTHER): Payer: No Typology Code available for payment source | Admitting: Licensed Clinical Social Worker

## 2023-01-21 DIAGNOSIS — F4310 Post-traumatic stress disorder, unspecified: Secondary | ICD-10-CM | POA: Diagnosis not present

## 2023-01-21 DIAGNOSIS — F411 Generalized anxiety disorder: Secondary | ICD-10-CM

## 2023-01-21 DIAGNOSIS — F331 Major depressive disorder, recurrent, moderate: Secondary | ICD-10-CM | POA: Diagnosis not present

## 2023-01-21 NOTE — Progress Notes (Signed)
Virtual Visit via Video Note  I connected with Mark [name] on 01/09/23 at 11-12pm EST by video-enabled telemedicine with the correct person using two identifiers.   I discussed the limitations of evaluation and management by telemedicine and the availability of in person appointments. The patient expressed understanding and agreed to proceed.  LOCATION Patient: home Provider: home office    History of Present Illness: Pt was referred to [facility] OP therapy for PTSD, depression and anxiety by the [facility].  Treatment Goal Addressed:   Pt will reduce irritability and increase normal social interaction with family and friends and will develop the ability to recognize, accept, and cope with feelings of depression and anxiety by using practiced coping skills for depression 1-2x daily AEB self report.  Progress towards Goal:  Progressing    Observations/Objective: Pt presented for today's session on time and was alert, oriented x5, with no evidence or self-report of SI/HI or A/V H.  Patient reported ongoing compliance with medication and denied any use of alcohol or illicit substances.  Clinician inquired about patient's current emotional ratings, as well as any significant changes in thoughts, feelings or behavior since previous session. Patient reported scores of 7/10 for depression, 8/10 for anxiety, 4/10 for anger/irritability, and denied any reoccurrence of panic attacks.  Pt identified stressors that occurred in his life over the past week. Cln and pt reviewed each stressor and coping skills used over the past week: grounding and breathing exercises:  packing up house, moving back to [place], moving back to toxic environment, giving away ESA,  financial issues, chronic pain, $ for car repair, pain in back due to blisters, (Will eventually need surgery),amputee disability. Pt reports, " I'm going for Christmas. Saturday." Cln asked open ended questions about expectations of spending time with his  toxic family. Cln and pt role played negative emotions displayed, anger displayed, frustration and disrespect displayed by his family and coping skills to deal with negative behaviors. Clinician utilized CBT to process concerns about going back to [place] for holidays. Clinician processed thoughts, feelings, and behaviors. Clinician discussed coping skills and options for supporting himself through these challenging times.   Marland Kitchen       Collaboration of Care: Other: Continue seeing providers at [place].    Patient/Guardian was advised Release of Information must be obtained prior to any record release in order to collaborate their care with an outside provider. Patient/Guardian was advised if they have not already done so to contact the registration department to sign all necessary forms in order for [name] to release information regarding their care.    Consent: Patient/Guardian gives verbal consent for treatment and assignment of benefits for services provided during this visit. Patient/Guardian expressed understanding and agreed to proceed.     Assessment and Plan: Counselor will continue to meet with patient and address treatment plan goals. Patient was given a chance for recommendations of providers and implement skills learned in session and encouraged to practice coping skills between sessions, practicing between sessions.Diagnosis: anxiety, depression and ptsd  Follow Up Instructions I discussed the assessment and treatment plan with the patient. The patient was provided an opportunity to ask questions and all were answered. The patient agreed with the plan and demonstrated an understanding of the instructions.   The patient was advised to call back or seek an in-person evaluation if the symptoms worsen or if the condition fails to improve as anticipated.  I provided 60 minutes of non-face-to-face time during this encounter.   Mark [name]  S, LCAS  .

## 2023-01-22 ENCOUNTER — Encounter (HOSPITAL_COMMUNITY): Payer: Self-pay | Admitting: Licensed Clinical Social Worker

## 2023-01-22 NOTE — Progress Notes (Signed)
Virtual Visit via Video Group Note  I connected with Mark [name] on 12/31/22 at 5:00 pm EST by a video enabled telemedicine application and verified that I am speaking with the correct person using two identifiers.   I discussed the limitations of evaluation and management by telemedicine and the availability of in person appointments. The patient expressed understanding and agreed to proceed.  LOCATION: Patient: Home Provider: home Office  History of Present Illness: [facility], [facility], [facility].  Treatment Goal Addressed:  Pt will develop the ability to recognize, accept, and cope with feelings of depression AEB self report.  Progress towards Goal:  Progressing    Observation/Objective:Patient participated in a group discussion on the holidays with family and friends. Patient described his holidays and coping skills used to deal with feelings surrounding the holidays. Clinician congratulated patient on coping skills used and tolerating the holidays with friends and family.     Assessment and Plan:  Counselor will continue to meet with patient to address treatment plan goals. Patient will continue to follow recommendations of providers and implement skills learned in session.    Follow-up instructions:I discussed the assessment and treatment plan with the patient. Patient was provided an opportunity to ask questions and all were answered. The patient agreed with the plan and demonstrated an understanding of the instructions.   The patient was advised to call back or seek an in-person evaluation if the symptoms worsen or if the condition fails to improve as anticipated.  Collaboration of Care: Other: Continue to see providers at [facility]  Patient/Guardian was advised Release of Information must be obtained prior to any record release in order to collaborate their care with an outside provider. Patient/Guardian was advised if they  have not already done so to contact the registration department to sign all necessary forms in order for [name] to release information regarding their care.   Consent: Patient/Guardian gives verbal consent for treatment and assignment of benefits for services provided during this visit. Patient/Guardian expressed understanding and agreed to proceed.      I provided 90 minutes of non-face-to-face time during this encounter.   Arianie Couse S, LCAS              1230/24

## 2023-01-24 ENCOUNTER — Ambulatory Visit (INDEPENDENT_AMBULATORY_CARE_PROVIDER_SITE_OTHER): Payer: No Typology Code available for payment source | Admitting: Licensed Clinical Social Worker

## 2023-01-24 ENCOUNTER — Encounter (HOSPITAL_COMMUNITY): Payer: Self-pay | Admitting: Licensed Clinical Social Worker

## 2023-01-24 DIAGNOSIS — F419 Anxiety disorder, unspecified: Secondary | ICD-10-CM

## 2023-01-24 DIAGNOSIS — F32A Depression, unspecified: Secondary | ICD-10-CM

## 2023-01-24 DIAGNOSIS — F4310 Post-traumatic stress disorder, unspecified: Secondary | ICD-10-CM

## 2023-01-24 DIAGNOSIS — F331 Major depressive disorder, recurrent, moderate: Secondary | ICD-10-CM

## 2023-01-26 ENCOUNTER — Encounter (HOSPITAL_COMMUNITY): Payer: Self-pay | Admitting: Licensed Clinical Social Worker

## 2023-01-28 ENCOUNTER — Ambulatory Visit (HOSPITAL_COMMUNITY): Payer: No Typology Code available for payment source | Admitting: Licensed Clinical Social Worker

## 2023-01-28 ENCOUNTER — Encounter (HOSPITAL_COMMUNITY): Payer: Self-pay | Admitting: Licensed Clinical Social Worker

## 2023-01-28 DIAGNOSIS — F32A Depression, unspecified: Secondary | ICD-10-CM

## 2023-01-28 DIAGNOSIS — F4310 Post-traumatic stress disorder, unspecified: Secondary | ICD-10-CM | POA: Diagnosis not present

## 2023-01-28 DIAGNOSIS — F411 Generalized anxiety disorder: Secondary | ICD-10-CM | POA: Diagnosis not present

## 2023-01-28 DIAGNOSIS — F331 Major depressive disorder, recurrent, moderate: Secondary | ICD-10-CM

## 2023-01-28 NOTE — Progress Notes (Signed)
 Virtual Visit via Video Group Note  I connected with Mark [name] on 12/31/22 at 5:00 pm EST by a video enabled telemedicine application and verified that I am speaking with the correct person using two identifiers.   I discussed the limitations of evaluation and management by telemedicine and the availability of in person appointments. The patient expressed understanding and agreed to proceed.  LOCATION: Patient: Home Provider: home Office  History of Present Illness: [facility], depression, [facility].  Treatment Goal Addressed:  Pt will develop the ability to recognize, accept, and cope with feelings of depression AEB self report.  Progress towards Goal:  Progressing    Observation/Objective: Today is the first day of 2025. Pt participated in a group on motivational words for the year to be used in our everyday language, perhaps a goal, encouragement, thoughtfulness, change, create are just some examples. Pt was given his/her motivational word for the new year 2025 WORD: INTENTIONAL:    Assessment and Plan:  Counselor will continue to meet with patient to address treatment plan goals. Patient will continue to follow recommendations of providers and implement skills learned in session.    Follow-up instructions:I discussed the assessment and treatment plan with the patient. Patient was provided an opportunity to ask questions and all were answered. The patient agreed with the plan and demonstrated an understanding of the instructions.   The patient was advised to call back or seek an in-person evaluation if the symptoms worsen or if the condition fails to improve as anticipated.  Collaboration of Care: Other: Continue to see providers at [facility]  Patient/Guardian was advised Release of Information must be obtained prior to any record release in order to collaborate their care with an outside provider. Patient/Guardian was advised if they  have not already done so to contact the registration department to sign all necessary forms in order for us  to release information regarding their care.   Consent: Patient/Guardian gives verbal consent for treatment and assignment of benefits for services provided during this visit. Patient/Guardian expressed understanding and agreed to proceed.      I provided 90 minutes of non-face-to-face time during this encounter.   Meztli Llanas S, LCAS              01/28/23

## 2023-01-31 ENCOUNTER — Ambulatory Visit (HOSPITAL_COMMUNITY): Payer: No Typology Code available for payment source | Admitting: Licensed Clinical Social Worker

## 2023-01-31 ENCOUNTER — Encounter (HOSPITAL_COMMUNITY): Payer: Self-pay | Admitting: Licensed Clinical Social Worker

## 2023-01-31 DIAGNOSIS — F4310 Post-traumatic stress disorder, unspecified: Secondary | ICD-10-CM | POA: Diagnosis not present

## 2023-01-31 DIAGNOSIS — F331 Major depressive disorder, recurrent, moderate: Secondary | ICD-10-CM

## 2023-01-31 DIAGNOSIS — F32A Depression, unspecified: Secondary | ICD-10-CM

## 2023-01-31 DIAGNOSIS — F419 Anxiety disorder, unspecified: Secondary | ICD-10-CM | POA: Diagnosis not present

## 2023-01-31 NOTE — Progress Notes (Addendum)
 Virtual Visit via Video Note  I connected with Mark [name] on 01/31/23 at 11-12pm EST by video-enabled telemedicine with the correct person using two identifiers.   I discussed the limitations of evaluation and management by telemedicine and the availability of in person appointments. The patient expressed understanding and agreed to proceed.  LOCATION Patient: home Provider: home office    History of Present Illness: Pt was referred to [facility] OP therapy for PTSD, depression and anxiety by the [facility].  Treatment Goal Addressed:   Pt will reduce irritability and increase normal social interaction with family and friends and will develop the ability to recognize, accept, and cope with feelings of depression and anxiety by using practiced coping skills for depression 1-2x daily AEB self report.  Progress towards Goal:  Progressing    Observations/Objective: Pt presented for today's session on time and was alert, oriented x5, with no evidence or self-report of SI/HI or A/V H.  Patient reported ongoing compliance with medication and denied any use of alcohol or illicit substances.  Clinician inquired about patient's current emotional ratings, as well as any significant changes in thoughts, feelings or behavior since previous session. Patient reported scores of 7/10 for depression, 8/10 for anxiety, 4/10 for anger/irritability, and denied any reoccurrence of panic attacks.  Pt identified stressors that occurred in his life over the past week. Cln and pt reviewed each stressor and coping skills used over the past week: grounding and breathing exercises:  packing up house, moving back to [place], moving back to toxic environment, giving away ESA,  financial issues, chronic pain, $ for car repair, pain in back due to blisters, (Will eventually need surgery),amputee disability. Pt reports, on his time being home for Christmas. I had an ok time, family disappointed me as usual. I still don't know if I'm  going to move back to [name]  to live. Cln asked open ended questions. Cln used CBT to assist in identifying positives to assess how his mood has been impacted. Clinician allowed space for patient to further process emotions and clinician provided supportive statements throughout session         Collaboration of Care: Other: Continue seeing providers at [place].    Patient/Guardian was advised Release of Information must be obtained prior to any record release in order to collaborate their care with an outside provider. Patient/Guardian was advised if they have not already done so to contact the registration department to sign all necessary forms in order for us  to release information regarding their care.    Consent: Patient/Guardian gives verbal consent for treatment and assignment of benefits for services provided during this visit. Patient/Guardian expressed understanding and agreed to proceed.     Assessment and Plan: Counselor will continue to meet with patient and address treatment plan goals. Patient was given a chance for recommendations of providers and implement skills learned in session and encouraged to practice coping skills between sessions, practicing between sessions.Diagnosis: anxiety, depression and ptsd  Follow Up Instructions I discussed the assessment and treatment plan with the patient. The patient was provided an opportunity to ask questions and all were answered. The patient agreed with the plan and demonstrated an understanding of the instructions.   The patient was advised to call back or seek an in-person evaluation if the symptoms worsen or if the condition fails to improve as anticipated.  I provided 60 minutes of non-face-to-face time during this encounter.   Azhar Yogi S, LCAS  .

## 2023-02-04 ENCOUNTER — Ambulatory Visit (HOSPITAL_COMMUNITY): Payer: No Typology Code available for payment source | Admitting: Licensed Clinical Social Worker

## 2023-02-04 ENCOUNTER — Encounter (HOSPITAL_COMMUNITY): Payer: Self-pay | Admitting: Licensed Clinical Social Worker

## 2023-02-04 DIAGNOSIS — F32A Depression, unspecified: Secondary | ICD-10-CM

## 2023-02-04 DIAGNOSIS — F4310 Post-traumatic stress disorder, unspecified: Secondary | ICD-10-CM | POA: Diagnosis not present

## 2023-02-04 DIAGNOSIS — F419 Anxiety disorder, unspecified: Secondary | ICD-10-CM | POA: Diagnosis not present

## 2023-02-04 DIAGNOSIS — F331 Major depressive disorder, recurrent, moderate: Secondary | ICD-10-CM

## 2023-02-04 NOTE — Progress Notes (Signed)
 Virtual Visit via Video Note  I connected with Mark [name] on 01/24/23 at 11-12pm EST by video-enabled telemedicine with the correct person using two identifiers.   I discussed the limitations of evaluation and management by telemedicine and the availability of in person appointments. The patient expressed understanding and agreed to proceed.  LOCATION Patient: home Provider: home office    History of Present Illness: Pt was referred to [facility] OP therapy for PTSD, depression and anxiety by the [facility].  Treatment Goal Addressed:   Pt will reduce irritability and increase normal social interaction with family and friends and will develop the ability to recognize, accept, and cope with feelings of depression and anxiety by using practiced coping skills for depression 1-2x daily AEB self report.  Progress towards Goal:  Progressing    Observations/Objective: Pt presented for today's session on time and was alert, oriented x5, with no evidence or self-report of SI/HI or A/V H.  Patient reported ongoing compliance with medication and denied any use of alcohol or illicit substances.  Clinician inquired about patient's current emotional ratings, as well as any significant changes in thoughts, feelings or behavior since previous session. Patient reported scores of 7/10 for depression, 8/10 for anxiety, 4/10 for anger/irritability, and denied any reoccurrence of panic attacks.  Pt identified stressors that occurred in his life over the past week. Cln and pt reviewed each stressor and coping skills used over the past week: grounding and breathing exercises:  packing up house, moving back to [place], moving back to toxic environment, giving away ESA,  financial issues, chronic pain, $ for car repair, pain in back due to blisters, (Will eventually need surgery),amputee disability. Cln and pt role played negative emotions displayed, anger displayed, frustration and disrespect displayed by his family and coping  skills he used to deal with negative behaviors. Cln used CBT to assist in identifying positives to assess how her mood has been impacted. Clinician allowed space for patient to further process emotions and clinician provided supportive statements throughout session. SABRA       Collaboration of Care: Other: Continue seeing providers at [place].    Patient/Guardian was advised Release of Information must be obtained prior to any record release in order to collaborate their care with an outside provider. Patient/Guardian was advised if they have not already done so to contact the registration department to sign all necessary forms in order for us  to release information regarding their care.    Consent: Patient/Guardian gives verbal consent for treatment and assignment of benefits for services provided during this visit. Patient/Guardian expressed understanding and agreed to proceed.     Assessment and Plan: Counselor will continue to meet with patient and address treatment plan goals. Patient was given a chance for recommendations of providers and implement skills learned in session and encouraged to practice coping skills between sessions, practicing between sessions.Diagnosis: anxiety, depression and ptsd  Follow Up Instructions I discussed the assessment and treatment plan with the patient. The patient was provided an opportunity to ask questions and all were answered. The patient agreed with the plan and demonstrated an understanding of the instructions.   The patient was advised to call back or seek an in-person evaluation if the symptoms worsen or if the condition fails to improve as anticipated.  I provided 60 minutes of non-face-to-face time during this encounter.   Aurilla Coulibaly S, LCAS  .

## 2023-02-04 NOTE — Progress Notes (Signed)
 Virtual Visit via Video Group Note  I connected with Mark [name] on 02/03/22 at 5:00 pm EST by a video enabled telemedicine application and verified that I am speaking with the correct person using two identifiers.   I discussed the limitations of evaluation and management by telemedicine and the availability of in person appointments. The patient expressed understanding and agreed to proceed.  LOCATION: Patient: Home Provider: home Office  History of Present Illness: [facility], [facility], [facility].  Treatment Goal Addressed:  Pt will develop the ability to recognize, accept, and cope with feelings of depression AEB self report.  Progress towards Goal:  Progressing    Observation/Objective: Patient participated in a group on making SMART goals for the new year of 2025. Clinician provided education on goal setting where SMART goals help focus efforts and increases the chances of achieving goals.  Patient was encouraged to identify personal goals for the new year of 2025.     2025 WORD: INTENTIONAL:    Assessment and Plan:  Counselor will continue to meet with patient to address treatment plan goals. Patient will continue to follow recommendations of providers and implement skills learned in session.    Follow-up instructions:I discussed the assessment and treatment plan with the patient. Patient was provided an opportunity to ask questions and all were answered. The patient agreed with the plan and demonstrated an understanding of the instructions.   The patient was advised to call back or seek an in-person evaluation if the symptoms worsen or if the condition fails to improve as anticipated.  Collaboration of Care: Other: Continue to see providers at [facility]  Patient/Guardian was advised Release of Information must be obtained prior to any record release in order to collaborate their care with an outside provider. Patient/Guardian  was advised if they have not already done so to contact the registration department to sign all necessary forms in order for us  to release information regarding their care.   Consent: Patient/Guardian gives verbal consent for treatment and assignment of benefits for services provided during this visit. Patient/Guardian expressed understanding and agreed to proceed.      I provided 90 minutes of non-face-to-face time during this encounter.   Crislyn Willbanks S, LCAS              02/04/23

## 2023-02-07 ENCOUNTER — Encounter (HOSPITAL_COMMUNITY): Payer: Self-pay | Admitting: Licensed Clinical Social Worker

## 2023-02-07 ENCOUNTER — Ambulatory Visit (HOSPITAL_COMMUNITY): Payer: No Typology Code available for payment source | Admitting: Licensed Clinical Social Worker

## 2023-02-07 DIAGNOSIS — F419 Anxiety disorder, unspecified: Secondary | ICD-10-CM | POA: Diagnosis not present

## 2023-02-07 DIAGNOSIS — F4310 Post-traumatic stress disorder, unspecified: Secondary | ICD-10-CM | POA: Diagnosis not present

## 2023-02-07 DIAGNOSIS — F331 Major depressive disorder, recurrent, moderate: Secondary | ICD-10-CM | POA: Diagnosis not present

## 2023-02-11 ENCOUNTER — Ambulatory Visit (INDEPENDENT_AMBULATORY_CARE_PROVIDER_SITE_OTHER): Payer: No Typology Code available for payment source | Admitting: Licensed Clinical Social Worker

## 2023-02-11 ENCOUNTER — Encounter (HOSPITAL_COMMUNITY): Payer: Self-pay | Admitting: Licensed Clinical Social Worker

## 2023-02-11 DIAGNOSIS — F419 Anxiety disorder, unspecified: Secondary | ICD-10-CM

## 2023-02-11 DIAGNOSIS — F4310 Post-traumatic stress disorder, unspecified: Secondary | ICD-10-CM | POA: Diagnosis not present

## 2023-02-11 DIAGNOSIS — F32A Depression, unspecified: Secondary | ICD-10-CM | POA: Diagnosis not present

## 2023-02-11 DIAGNOSIS — F331 Major depressive disorder, recurrent, moderate: Secondary | ICD-10-CM

## 2023-02-11 NOTE — Progress Notes (Signed)
Virtual Visit via Video Group Note  I connected with Mark [name] on 02/10/22 at 5:00-6:30pm EST by a video enabled telemedicine application and verified that I am speaking with the correct person using two identifiers.   I discussed the limitations of evaluation and management by telemedicine and the availability of in person appointments. The patient expressed understanding and agreed to proceed.  LOCATION: Patient: Home Provider: home Office  History of Present Illness: [facility], [facility], [facility].  Treatment Goal Addressed:  Pt will develop the ability to recognize, accept, and cope with feelings of depression AEB self report.  Progress towards Goal:  Progressing    Observation/Objective: Patient participated in a group discussion on sense of self-worth, valuing self, and having a sense of feeling worthy. Patient was encouraged to acknowledge the inner critic and practice positive affirmations.     2025 WORD: INTENTIONAL:    Assessment and Plan:  Counselor will continue to meet with patient to address treatment plan goals. Patient will continue to follow recommendations of providers and implement skills learned in session.    Follow-up instructions:I discussed the assessment and treatment plan with the patient. Patient was provided an opportunity to ask questions and all were answered. The patient agreed with the plan and demonstrated an understanding of the instructions.   The patient was advised to call back or seek an in-person evaluation if the symptoms worsen or if the condition fails to improve as anticipated.  Collaboration of Care: Other: Continue to see providers at [facility]  Patient/Guardian was advised Release of Information must be obtained prior to any record release in order to collaborate their care with an outside provider. Patient/Guardian was advised if they have not already done so to contact the registration  department to sign all necessary forms in order for [name] to release information regarding their care.   Consent: Patient/Guardian gives verbal consent for treatment and assignment of benefits for services provided during this visit. Patient/Guardian expressed understanding and agreed to proceed.      I provided 90 minutes of non-face-to-face time during this encounter.   Mansel Strother S, LCAS              02/11/23

## 2023-02-12 NOTE — Progress Notes (Signed)
Virtual Visit via Video Note  I connected with Mark [name] on 02/07/23 at 11-12pm EST by video-enabled telemedicine with the correct person using two identifiers.   I discussed the limitations of evaluation and management by telemedicine and the availability of in person appointments. The patient expressed understanding and agreed to proceed.  LOCATION Patient: home Provider: home office    History of Present Illness: Pt was referred to [facility] OP therapy for PTSD, depression and anxiety by the [facility].  Treatment Goal Addressed:   Pt will reduce irritability and increase normal social interaction with family and friends and will develop the ability to recognize, accept, and cope with feelings of depression and anxiety by using practiced coping skills for depression 1-2x daily AEB self report.  Progress towards Goal:  Progressing    Observations/Objective: Pt presented for today's session on time and was alert, oriented x5, with no evidence or self-report of SI/HI or A/V H.  Patient reported ongoing compliance with medication and denied any use of alcohol or illicit substances.  Clinician inquired about patient's current emotional ratings, as well as any significant changes in thoughts, feelings or behavior since previous session. Patient reported scores of 7/10 for depression, 8/10 for anxiety, 4/10 for anger/irritability, and denied any reoccurrence of panic attacks.  Pt identified stressors that occurred in his life over the past week. Cln and pt reviewed each stressor and coping skills used over the past week: grounding and breathing exercises:  packing up house, moving back to [place], moving back to toxic environment, giving away ESA,  financial issues, chronic pain, $ for car repair, pain in back due to blisters, (Will eventually need surgery),amputee disability. Pt wanted to process his decision making: moving back home with family/staying in [place]. Cln explored with pt his negative  cycle of anxiety including ruminative  thoughts and avoidant behaviors and discussed negative (longer-term)  consequences of avoidance. skill. Majority of skill practice was focused on  identifying specific problem (and priority/goal) and brainstorming  options/alternatives.        Collaboration of Care: Other: Continue seeing providers at [place].    Patient/Guardian was advised Release of Information must be obtained prior to any record release in order to collaborate their care with an outside provider. Patient/Guardian was advised if they have not already done so to contact the registration department to sign all necessary forms in order for [name] to release information regarding their care.    Consent: Patient/Guardian gives verbal consent for treatment and assignment of benefits for services provided during this visit. Patient/Guardian expressed understanding and agreed to proceed.     Assessment and Plan: Counselor will continue to meet with patient and address treatment plan goals. Patient was given a chance for recommendations of providers and implement skills learned in session and encouraged to practice coping skills between sessions, practicing between sessions.Diagnosis: anxiety, depression and ptsd  Follow Up Instructions I discussed the assessment and treatment plan with the patient. The patient was provided an opportunity to ask questions and all were answered. The patient agreed with the plan and demonstrated an understanding of the instructions.   The patient was advised to call back or seek an in-person evaluation if the symptoms worsen or if the condition fails to improve as anticipated.  I provided 60 minutes of non-face-to-face time during this encounter.   Taria Castrillo S, LCAS  .

## 2023-02-14 ENCOUNTER — Ambulatory Visit (HOSPITAL_COMMUNITY): Payer: No Typology Code available for payment source | Admitting: Licensed Clinical Social Worker

## 2023-02-14 DIAGNOSIS — F4310 Post-traumatic stress disorder, unspecified: Secondary | ICD-10-CM | POA: Diagnosis not present

## 2023-02-14 DIAGNOSIS — F419 Anxiety disorder, unspecified: Secondary | ICD-10-CM

## 2023-02-14 DIAGNOSIS — F331 Major depressive disorder, recurrent, moderate: Secondary | ICD-10-CM | POA: Diagnosis not present

## 2023-02-18 ENCOUNTER — Ambulatory Visit (HOSPITAL_COMMUNITY): Payer: No Typology Code available for payment source | Admitting: Licensed Clinical Social Worker

## 2023-02-18 ENCOUNTER — Encounter (HOSPITAL_COMMUNITY): Payer: Self-pay | Admitting: Licensed Clinical Social Worker

## 2023-02-18 DIAGNOSIS — F419 Anxiety disorder, unspecified: Secondary | ICD-10-CM

## 2023-02-18 DIAGNOSIS — F331 Major depressive disorder, recurrent, moderate: Secondary | ICD-10-CM | POA: Diagnosis not present

## 2023-02-18 DIAGNOSIS — F4310 Post-traumatic stress disorder, unspecified: Secondary | ICD-10-CM | POA: Diagnosis not present

## 2023-02-18 NOTE — Progress Notes (Signed)
Virtual Visit via Video Group Note  I connected with Mark [name] on 02/17/22 at 5:00-6:30pm EST by a video enabled telemedicine application and verified that I am speaking with the correct person using two identifiers.   I discussed the limitations of evaluation and management by telemedicine and the availability of in person appointments. The patient expressed understanding and agreed to proceed.  LOCATION: Patient: Home Provider: home Office  History of Present Illness: [facility], [facility], [facility].  Treatment Goal Addressed:  Pt will develop the ability to recognize, accept, and cope with feelings of depression AEB self report.  Progress towards Goal:  Progressing    Observation/Objective: Patient participated in a discussion on relationships, quantity vs quality, what's important: listening, communicating with openness and honesty, sharing similar values which creates a sense of belonging and bonding. Patient was encouraged to continue to grow within relationships.     2025 WORD: INTENTIONAL:    Assessment and Plan:  Counselor will continue to meet with patient to address treatment plan goals. Patient will continue to follow recommendations of providers and implement skills learned in session.    Follow-up instructions:I discussed the assessment and treatment plan with the patient. Patient was provided an opportunity to ask questions and all were answered. The patient agreed with the plan and demonstrated an understanding of the instructions.   The patient was advised to call back or seek an in-person evaluation if the symptoms worsen or if the condition fails to improve as anticipated.  Collaboration of Care: Other: Continue to see providers at [facility]  Patient/Guardian was advised Release of Information must be obtained prior to any record release in order to collaborate their care with an outside provider. Patient/Guardian  was advised if they have not already done so to contact the registration department to sign all necessary forms in order for [name] to release information regarding their care.   Consent: Patient/Guardian gives verbal consent for treatment and assignment of benefits for services provided during this visit. Patient/Guardian expressed understanding and agreed to proceed.      I provided 90 minutes of non-face-to-face time during this encounter.   Magdaline Zollars S, LCAS              02/18/23

## 2023-02-20 ENCOUNTER — Encounter (HOSPITAL_COMMUNITY): Payer: Self-pay | Admitting: Licensed Clinical Social Worker

## 2023-02-20 NOTE — Progress Notes (Signed)
Virtual Visit via Video Note  I connected with Mark [name] on 02/14/23 at 11-12pm EST by video-enabled telemedicine with the correct person using two identifiers.   I discussed the limitations of evaluation and management by telemedicine and the availability of in person appointments. The patient expressed understanding and agreed to proceed.  LOCATION Patient: home Provider: home office    History of Present Illness: Pt was referred to [facility] OP therapy for PTSD, depression and anxiety by the [facility].  Treatment Goal Addressed:   Pt will reduce irritability and increase normal social interaction with family and friends and will develop the ability to recognize, accept, and cope with feelings of depression and anxiety by using practiced coping skills for depression 1-2x daily AEB self report.  Progress towards Goal:  Progressing    Observations/Objective: Pt presented for today's session on time and was alert, oriented x5, with no evidence or self-report of SI/HI or A/V H.  Patient reported ongoing compliance with medication and denied any use of alcohol or illicit substances.  Clinician inquired about patient's current emotional ratings, as well as any significant changes in thoughts, feelings or behavior since previous session. Patient reported scores of 7/10 for depression, 8/10 for anxiety, 4/10 for anger/irritability, and denied any reoccurrence of panic attacks.  Pt identified stressors that occurred in his life over the past week. Cln and pt reviewed each stressor and coping skills used over the past week: grounding and breathing exercises:  packing up house, moving back to [place], moving back to toxic environment, giving away ESA,  financial issues, chronic pain, $ for car repair, pain in back due to blisters, (Will eventually need surgery),amputee disability. Pt wanted to process his decision making: moving back home with family/staying in [place]. Pt participated in a discussion on  relationships. Cln provided psychoeducation on dynamics of healthy vs unhealthy relationships associated with power and control. Pt was encouraged to engage in healthy relationships, not toxic relationships.   Collaboration of Care: Other: Continue seeing providers at [place].    Patient/Guardian was advised Release of Information must be obtained prior to any record release in order to collaborate their care with an outside provider. Patient/Guardian was advised if they have not already done so to contact the registration department to sign all necessary forms in order for [name] to release information regarding their care.    Consent: Patient/Guardian gives verbal consent for treatment and assignment of benefits for services provided during this visit. Patient/Guardian expressed understanding and agreed to proceed.     Assessment and Plan: Counselor will continue to meet with patient and address treatment plan goals. Patient was given a chance for recommendations of providers and implement skills learned in session and encouraged to practice coping skills between sessions, practicing between sessions.Diagnosis: anxiety, depression and ptsd  Follow Up Instructions I discussed the assessment and treatment plan with the patient. The patient was provided an opportunity to ask questions and all were answered. The patient agreed with the plan and demonstrated an understanding of the instructions.   The patient was advised to call back or seek an in-person evaluation if the symptoms worsen or if the condition fails to improve as anticipated.  I provided 60 minutes of non-face-to-face time during this encounter.   Delfin Squillace S, LCAS  .

## 2023-02-21 ENCOUNTER — Ambulatory Visit (INDEPENDENT_AMBULATORY_CARE_PROVIDER_SITE_OTHER): Payer: No Typology Code available for payment source | Admitting: Licensed Clinical Social Worker

## 2023-02-21 DIAGNOSIS — F419 Anxiety disorder, unspecified: Secondary | ICD-10-CM

## 2023-02-21 DIAGNOSIS — F4310 Post-traumatic stress disorder, unspecified: Secondary | ICD-10-CM

## 2023-02-21 DIAGNOSIS — F331 Major depressive disorder, recurrent, moderate: Secondary | ICD-10-CM | POA: Diagnosis not present

## 2023-02-24 ENCOUNTER — Encounter (HOSPITAL_COMMUNITY): Payer: Self-pay | Admitting: Licensed Clinical Social Worker

## 2023-02-24 NOTE — Progress Notes (Signed)
Virtual Visit via Video Note  I connected with Mark [name] on 02/21/23 at 11-12pm EST by video-enabled telemedicine with the correct person using two identifiers.   I discussed the limitations of evaluation and management by telemedicine and the availability of in person appointments. The patient expressed understanding and agreed to proceed.  LOCATION Patient: home Provider: home office    History of Present Illness: Pt was referred to [facility] OP therapy for PTSD, depression and anxiety by the [facility].  Treatment Goal Addressed:   Pt will reduce irritability and increase normal social interaction with family and friends and will develop the ability to recognize, accept, and cope with feelings of depression and anxiety by using practiced coping skills for depression 1-2x daily AEB self report.  Progress towards Goal:  Progressing    Observations/Objective: Pt presented for today's session on time and was alert, oriented x5, with no evidence or self-report of SI/HI or A/V H.  Patient reported ongoing compliance with medication and denied any use of alcohol or illicit substances.  Clinician inquired about patient's current emotional ratings, as well as any significant changes in thoughts, feelings or behavior since previous session. Patient reported scores of 7/10 for depression, 8/10 for anxiety, 4/10 for anger/irritability, and denied any reoccurrence of panic attacks.  Pt identified stressors that occurred in his life over the past week. Cln and pt reviewed each stressor and coping skills used over the past week: grounding and breathing exercises:  packing up house, moving back to [place], moving back to toxic environment, giving away ESA,  financial issues, chronic pain,  pain in back due to blisters, (Will eventually need surgery),amputee disability. Pt was angry and displaying signs of low frustration tolerance. Cln asked open ended questions and pt reported all the things that had gone wrong  in this life this morning. Cln provided psychoeducation increasing frustration tolerance which will make his life easier when small things happen in his life that he finds to be overwhelming.    Collaboration of Care: Other: Continue seeing providers at [place].    Patient/Guardian was advised Release of Information must be obtained prior to any record release in order to collaborate their care with an outside provider. Patient/Guardian was advised if they have not already done so to contact the registration department to sign all necessary forms in order for [name] to release information regarding their care.    Consent: Patient/Guardian gives verbal consent for treatment and assignment of benefits for services provided during this visit. Patient/Guardian expressed understanding and agreed to proceed.     Assessment and Plan: Counselor will continue to meet with patient and address treatment plan goals. Patient was given a chance for recommendations of providers and implement skills learned in session and encouraged to practice coping skills between sessions, practicing between sessions.Diagnosis: anxiety, depression and ptsd  Follow Up Instructions I discussed the assessment and treatment plan with the patient. The patient was provided an opportunity to ask questions and all were answered. The patient agreed with the plan and demonstrated an understanding of the instructions.   The patient was advised to call back or seek an in-person evaluation if the symptoms worsen or if the condition fails to improve as anticipated.  I provided 60 minutes of non-face-to-face time during this encounter.   Brae Gartman S, LCAS  .

## 2023-02-25 ENCOUNTER — Encounter (HOSPITAL_COMMUNITY): Payer: Self-pay | Admitting: Licensed Clinical Social Worker

## 2023-02-25 ENCOUNTER — Ambulatory Visit (INDEPENDENT_AMBULATORY_CARE_PROVIDER_SITE_OTHER): Payer: No Typology Code available for payment source | Admitting: Licensed Clinical Social Worker

## 2023-02-25 DIAGNOSIS — F32A Depression, unspecified: Secondary | ICD-10-CM | POA: Diagnosis not present

## 2023-02-25 DIAGNOSIS — F331 Major depressive disorder, recurrent, moderate: Secondary | ICD-10-CM

## 2023-02-25 DIAGNOSIS — F4310 Post-traumatic stress disorder, unspecified: Secondary | ICD-10-CM | POA: Diagnosis not present

## 2023-02-25 DIAGNOSIS — F419 Anxiety disorder, unspecified: Secondary | ICD-10-CM

## 2023-02-25 NOTE — Progress Notes (Signed)
Virtual Visit via Video Group Note  I connected with Mark [name] on 02/17/22 at 5:00-6:30pm EST by a video enabled telemedicine application and verified that I am speaking with the correct person using two identifiers.   I discussed the limitations of evaluation and management by telemedicine and the availability of in person appointments. The patient expressed understanding and agreed to proceed.  LOCATION: Patient: Home Provider: home Office  History of Present Illness: [facility], [facility].  Treatment Goal Addressed:  Pt will develop the ability to recognize, accept, and cope with feelings of depression AEB self report.  Progress towards Goal:  Progressing    Observation/Objective: Patient engaged in discussion on identified positives to assess how mood has been impacted. Clinician allowed space for group to further process emotions and clinician provided supportive statements throughout session for each patient. It was suggested to patient to continue to identify positives that  impact his mood.    2025 WORD: INTENTIONAL:    Assessment and Plan:  Counselor will continue to meet with patient to address treatment plan goals. Patient will continue to follow recommendations of providers and implement skills learned in session.    Follow-up instructions:I discussed the assessment and treatment plan with the patient. Patient was provided an opportunity to ask questions and all were answered. The patient agreed with the plan and demonstrated an understanding of the instructions.   The patient was advised to call back or seek an in-person evaluation if the symptoms worsen or if the condition fails to improve as anticipated.  Collaboration of Care: Other: Continue to see providers at [facility]  Patient/Guardian was advised Release of Information must be obtained prior to any record release in order to collaborate their care with  an outside provider. Patient/Guardian was advised if they have not already done so to contact the registration department to sign all necessary forms in order for [name] to release information regarding their care.   Consent: Patient/Guardian gives verbal consent for treatment and assignment of benefits for services provided during this visit. Patient/Guardian expressed understanding and agreed to proceed.      I provided 90 minutes of non-face-to-face time during this encounter.   Andy Moye S, LCAS              02/25/23

## 2023-02-28 ENCOUNTER — Ambulatory Visit (HOSPITAL_COMMUNITY): Payer: No Typology Code available for payment source | Admitting: Licensed Clinical Social Worker

## 2023-02-28 ENCOUNTER — Encounter (HOSPITAL_COMMUNITY): Payer: Self-pay | Admitting: Licensed Clinical Social Worker

## 2023-02-28 DIAGNOSIS — F331 Major depressive disorder, recurrent, moderate: Secondary | ICD-10-CM

## 2023-02-28 DIAGNOSIS — F4310 Post-traumatic stress disorder, unspecified: Secondary | ICD-10-CM

## 2023-02-28 DIAGNOSIS — F32A Depression, unspecified: Secondary | ICD-10-CM

## 2023-02-28 DIAGNOSIS — F419 Anxiety disorder, unspecified: Secondary | ICD-10-CM

## 2023-02-28 NOTE — Progress Notes (Signed)
 Virtual Visit via Video Note  I connected with Mark [name] on 02/28/23 at 11-12pm EST by video-enabled telemedicine with the correct person using two identifiers.   I discussed the limitations of evaluation and management by telemedicine and the availability of in person appointments. The patient expressed understanding and agreed to proceed.  LOCATION Patient: home Provider: home office    History of Present Illness: Pt was referred to [facility] OP therapy for PTSD, depression and anxiety by the [facility].  Treatment Goal Addressed:   Pt will reduce irritability and increase normal social interaction with family and friends and will develop the ability to recognize, accept, and cope with feelings of depression and anxiety by using practiced coping skills for depression 1-2x daily AEB self report.  Progress towards Goal:  Progressing    Observations/Objective: Pt presented for today's session on time and was alert, oriented x5, with no evidence or self-report of SI/HI or A/V H.  Patient reported ongoing compliance with medication and denied any use of alcohol or illicit substances.  Clinician inquired about patient's current emotional ratings, as well as any significant changes in thoughts, feelings or behavior since previous session. Patient reported scores of 7/10 for depression, 8/10 for anxiety, 4/10 for anger/irritability, and denied any reoccurrence of panic attacks.  Pt identified stressors that occurred in his life over the past week. Cln and pt reviewed each stressor and coping skills used over the past week: grounding and breathing exercises:  packing up house, moving back to [place], moving back to toxic environment, giving away ESA,  financial issues, chronic pain,  pain in back due to blisters, (Will eventually need surgery),amputee disability. Cln asked open ended questions. Pt reports the sores on his back have gotten worse, I may go to the ER this weekend. Cln asked open ended  questions. Clinician utilized CBT to address thought processes, support, and confidence in his decisions.      Collaboration of Care: Other: Continue seeing providers at [place].    Patient/Guardian was advised Release of Information must be obtained prior to any record release in order to collaborate their care with an outside provider. Patient/Guardian was advised if they have not already done so to contact the registration department to sign all necessary forms in order for us  to release information regarding their care.    Consent: Patient/Guardian gives verbal consent for treatment and assignment of benefits for services provided during this visit. Patient/Guardian expressed understanding and agreed to proceed.     Assessment and Plan: Counselor will continue to meet with patient and address treatment plan goals. Patient was given a chance for recommendations of providers and implement skills learned in session and encouraged to practice coping skills between sessions, practicing between sessions.Diagnosis: anxiety, depression and ptsd  Follow Up Instructions I discussed the assessment and treatment plan with the patient. The patient was provided an opportunity to ask questions and all were answered. The patient agreed with the plan and demonstrated an understanding of the instructions.   The patient was advised to call back or seek an in-person evaluation if the symptoms worsen or if the condition fails to improve as anticipated.  I provided 60 minutes of non-face-to-face time during this encounter.   Cindie Rajagopalan S, LCAS  .

## 2023-03-04 ENCOUNTER — Ambulatory Visit (INDEPENDENT_AMBULATORY_CARE_PROVIDER_SITE_OTHER): Payer: No Typology Code available for payment source | Admitting: Licensed Clinical Social Worker

## 2023-03-04 ENCOUNTER — Encounter (HOSPITAL_COMMUNITY): Payer: Self-pay | Admitting: Licensed Clinical Social Worker

## 2023-03-04 DIAGNOSIS — F32A Depression, unspecified: Secondary | ICD-10-CM | POA: Diagnosis not present

## 2023-03-04 DIAGNOSIS — F419 Anxiety disorder, unspecified: Secondary | ICD-10-CM

## 2023-03-04 DIAGNOSIS — F331 Major depressive disorder, recurrent, moderate: Secondary | ICD-10-CM

## 2023-03-04 DIAGNOSIS — F4310 Post-traumatic stress disorder, unspecified: Secondary | ICD-10-CM

## 2023-03-04 NOTE — Progress Notes (Signed)
 Virtual Visit via Video Group Note  I connected with Mark [name] on 03/04/23 at 5:00-6:30pm EST by a video enabled telemedicine application and verified that I am speaking with the correct person using two identifiers.   I discussed the limitations of evaluation and management by telemedicine and the availability of in person appointments. The patient expressed understanding and agreed to proceed.  LOCATION: Patient: Home Provider: home Office  History of Present Illness: [facility], [facility], [facility].  Treatment Goal Addressed:  Pt will develop the ability to recognize, accept, and cope with feelings of depression AEB self report.  Progress towards Goal:  Progressing    Observation/Objective:  Patient participated in a discussion on fulfillment vs happiness. "Happiness comes from within ourselves. The group defined: Happiness comes from what we do; Fulfillment comes from why we do it. Pt was encouraged to seek fulfillment which is more sustainable than happiness.     2025 WORD: INTENTIONAL:    Assessment and Plan:  Counselor will continue to meet with patient to address treatment plan goals. Patient will continue to follow recommendations of providers and implement skills learned in session.    Follow-up instructions:I discussed the assessment and treatment plan with the patient. Patient was provided an opportunity to ask questions and all were answered. The patient agreed with the plan and demonstrated an understanding of the instructions.   The patient was advised to call back or seek an in-person evaluation if the symptoms worsen or if the condition fails to improve as anticipated.  Collaboration of Care: Other: Continue to see providers at [facility]  Patient/Guardian was advised Release of Information must be obtained prior to any record release in order to collaborate their care with an outside provider. Patient/Guardian was  advised if they have not already done so to contact the registration department to sign all necessary forms in order for us  to release information regarding their care.   Consent: Patient/Guardian gives verbal consent for treatment and assignment of benefits for services provided during this visit. Patient/Guardian expressed understanding and agreed to proceed.      I provided 90 minutes of non-face-to-face time during this encounter.   Alline Pio S, LCAS              03/04/23

## 2023-03-06 ENCOUNTER — Other Ambulatory Visit: Payer: Self-pay

## 2023-03-06 ENCOUNTER — Ambulatory Visit (INDEPENDENT_AMBULATORY_CARE_PROVIDER_SITE_OTHER): Payer: No Typology Code available for payment source | Admitting: Licensed Clinical Social Worker

## 2023-03-06 DIAGNOSIS — F32A Depression, unspecified: Secondary | ICD-10-CM | POA: Diagnosis not present

## 2023-03-06 DIAGNOSIS — F4310 Post-traumatic stress disorder, unspecified: Secondary | ICD-10-CM

## 2023-03-06 DIAGNOSIS — F331 Major depressive disorder, recurrent, moderate: Secondary | ICD-10-CM

## 2023-03-06 DIAGNOSIS — F419 Anxiety disorder, unspecified: Secondary | ICD-10-CM | POA: Diagnosis not present

## 2023-03-07 ENCOUNTER — Ambulatory Visit (HOSPITAL_COMMUNITY): Payer: No Typology Code available for payment source | Admitting: Licensed Clinical Social Worker

## 2023-03-11 ENCOUNTER — Ambulatory Visit (INDEPENDENT_AMBULATORY_CARE_PROVIDER_SITE_OTHER): Payer: No Typology Code available for payment source | Admitting: Licensed Clinical Social Worker

## 2023-03-11 ENCOUNTER — Encounter (HOSPITAL_COMMUNITY): Payer: Self-pay | Admitting: Licensed Clinical Social Worker

## 2023-03-11 DIAGNOSIS — F331 Major depressive disorder, recurrent, moderate: Secondary | ICD-10-CM

## 2023-03-11 DIAGNOSIS — F4310 Post-traumatic stress disorder, unspecified: Secondary | ICD-10-CM | POA: Diagnosis not present

## 2023-03-11 DIAGNOSIS — F32A Depression, unspecified: Secondary | ICD-10-CM | POA: Diagnosis not present

## 2023-03-11 DIAGNOSIS — F419 Anxiety disorder, unspecified: Secondary | ICD-10-CM

## 2023-03-11 NOTE — Progress Notes (Signed)
Virtual Visit via Video Group Note  I connected with Mark [name] on 03/11/23 at 5:00-6:30pm EST by a video enabled telemedicine application and verified that I am speaking with the correct person using two identifiers.   I discussed the limitations of evaluation and management by telemedicine and the availability of in person appointments. The patient expressed understanding and agreed to proceed.  LOCATION: Patient: Home Provider: home Office  History of Present Illness: [facility], [facility], [facility].  Treatment Goal Addressed:  Pt will develop the ability to recognize, accept, and cope with feelings of depression AEB self report.  Progress towards Goal:  Progressing    Observation/Objective: Patient participated in a discussion on reducing stress to improve mental health, by using EFT (Emotional Freedom Technique). The basic tenets of EFT were taught in the group, while patient participated in the technique. Patient was allowed to ask questions about the principles of the healing technique. Patient practiced during the session and was encouraged to continue using EFT between session to feel address the root cause of distress to feel more relaxed and in control.      2025 WORD: INTENTIONAL:    Assessment and Plan:  Counselor will continue to meet with patient to address treatment plan goals. Patient will continue to follow recommendations of providers and implement skills learned in session.    Follow-up instructions:I discussed the assessment and treatment plan with the patient. Patient was provided an opportunity to ask questions and all were answered. The patient agreed with the plan and demonstrated an understanding of the instructions.   The patient was advised to call back or seek an in-person evaluation if the symptoms worsen or if the condition fails to improve as anticipated.  Collaboration of Care: Other: Continue to  see providers at [facility]  Patient/Guardian was advised Release of Information must be obtained prior to any record release in order to collaborate their care with an outside provider. Patient/Guardian was advised if they have not already done so to contact the registration department to sign all necessary forms in order for [name] to release information regarding their care.   Consent: Patient/Guardian gives verbal consent for treatment and assignment of benefits for services provided during this visit. Patient/Guardian expressed understanding and agreed to proceed.      I provided 90 minutes of non-face-to-face time during this encounter.   Loney Domingo S, LCAS              03/11/23

## 2023-03-13 ENCOUNTER — Ambulatory Visit (INDEPENDENT_AMBULATORY_CARE_PROVIDER_SITE_OTHER): Payer: No Typology Code available for payment source | Admitting: Licensed Clinical Social Worker

## 2023-03-13 DIAGNOSIS — F32A Depression, unspecified: Secondary | ICD-10-CM | POA: Diagnosis not present

## 2023-03-13 DIAGNOSIS — F331 Major depressive disorder, recurrent, moderate: Secondary | ICD-10-CM

## 2023-03-13 DIAGNOSIS — F419 Anxiety disorder, unspecified: Secondary | ICD-10-CM

## 2023-03-13 DIAGNOSIS — F4310 Post-traumatic stress disorder, unspecified: Secondary | ICD-10-CM

## 2023-03-14 ENCOUNTER — Ambulatory Visit (HOSPITAL_COMMUNITY): Payer: No Typology Code available for payment source | Admitting: Licensed Clinical Social Worker

## 2023-03-17 ENCOUNTER — Encounter (HOSPITAL_COMMUNITY): Payer: Self-pay | Admitting: Licensed Clinical Social Worker

## 2023-03-17 NOTE — Progress Notes (Signed)
 Virtual Visit via Video Note  I connected with Mark [name] on 03/06/23 at 1-2pm EST by video-enabled telemedicine with the correct person using two identifiers.   I discussed the limitations of evaluation and management by telemedicine and the availability of in person appointments. The patient expressed understanding and agreed to proceed.  LOCATION Patient: home Provider: home office    History of Present Illness: Pt was referred to [facility] OP therapy for PTSD, depression and anxiety by the [facility].  Treatment Goal Addressed:   Pt will reduce irritability and increase normal social interaction with family and friends and will develop the ability to recognize, accept, and cope with feelings of depression and anxiety by using practiced coping skills for depression 1-2x daily AEB self report.  Progress towards Goal:  Progressing    Observations/Objective: Pt presented for today's session on time and was alert, oriented x5, with no evidence or self-report of SI/HI or A/V H.  Patient reported ongoing compliance with medication and denied any use of alcohol or illicit substances.  Clinician inquired about patient's current emotional ratings, as well as any significant changes in thoughts, feelings or behavior since previous session. Patient reported scores of 7/10 for depression, 8/10 for anxiety, 4/10 for anger/irritability, and denied any reoccurrence of panic attacks.  Pt identified stressors that occurred in his life over the past week. Cln and pt reviewed each stressor and coping skills used over the past week: grounding and breathing exercises:  packing up house, moving back to [place], family, moving back to toxic environment, giving away ESA,  financial issues, chronic pain,  pain in back due to blisters, (Will eventually need surgery), amputee disability. Cln asked open ended questions. Pt reports "I've been overwhelmed this week with work and family issues." Cln asked open ended questions  and provided education on compartmentalization to assist with his feelings of being overwhelmed.     Collaboration of Care: Other: Continue seeing providers at [place].    Patient/Guardian was advised Release of Information must be obtained prior to any record release in order to collaborate their care with an outside provider. Patient/Guardian was advised if they have not already done so to contact the registration department to sign all necessary forms in order for [name] to release information regarding their care.    Consent: Patient/Guardian gives verbal consent for treatment and assignment of benefits for services provided during this visit. Patient/Guardian expressed understanding and agreed to proceed.     Assessment and Plan: Counselor will continue to meet with patient and address treatment plan goals. Patient was given a chance for recommendations of providers and implement skills learned in session and encouraged to practice coping skills between sessions, practicing between sessions.Diagnosis: anxiety, depression and ptsd  Follow Up Instructions I discussed the assessment and treatment plan with the patient. The patient was provided an opportunity to ask questions and all were answered. The patient agreed with the plan and demonstrated an understanding of the instructions.   The patient was advised to call back or seek an in-person evaluation if the symptoms worsen or if the condition fails to improve as anticipated.  I provided 60 minutes of non-face-to-face time during this encounter.   Marvell Stavola S, LCAS  .

## 2023-03-18 ENCOUNTER — Ambulatory Visit (INDEPENDENT_AMBULATORY_CARE_PROVIDER_SITE_OTHER): Payer: No Typology Code available for payment source | Admitting: Licensed Clinical Social Worker

## 2023-03-18 ENCOUNTER — Encounter (HOSPITAL_COMMUNITY): Payer: Self-pay | Admitting: Licensed Clinical Social Worker

## 2023-03-18 DIAGNOSIS — F32A Depression, unspecified: Secondary | ICD-10-CM | POA: Diagnosis not present

## 2023-03-18 DIAGNOSIS — F419 Anxiety disorder, unspecified: Secondary | ICD-10-CM

## 2023-03-18 DIAGNOSIS — F4310 Post-traumatic stress disorder, unspecified: Secondary | ICD-10-CM

## 2023-03-18 DIAGNOSIS — F331 Major depressive disorder, recurrent, moderate: Secondary | ICD-10-CM

## 2023-03-18 NOTE — Progress Notes (Signed)
 Virtual Visit via Video Group Note  I connected with Mark [name] on 03/18/23 at 5:00-6:30pm EST by a video enabled telemedicine application and verified that I am speaking with the correct person using two identifiers.   I discussed the limitations of evaluation and management by telemedicine and the availability of in person appointments. The patient expressed understanding and agreed to proceed.  LOCATION: Patient: Home Provider: home Office  History of Present Illness: [facility], [facility], [facility].  Treatment Goal Addressed:  Pt will develop the ability to recognize, accept, and cope with feelings of depression AEB self report.  Progress towards Goal:  Progressing    Observation/Objective:  Patient participated in a discussion on relationships, quantity vs quality, what's important: listening, communicating with openness and honesty, sharing similar values which creates a sense of belonging and bonding. Patient was encouraged to continue to grow within relationships.    2025 WORD: INTENTIONAL:    Assessment and Plan:  Counselor will continue to meet with patient to address treatment plan goals. Patient will continue to follow recommendations of providers and implement skills learned in session.    Follow-up instructions:I discussed the assessment and treatment plan with the patient. Patient was provided an opportunity to ask questions and all were answered. The patient agreed with the plan and demonstrated an understanding of the instructions.   The patient was advised to call back or seek an in-person evaluation if the symptoms worsen or if the condition fails to improve as anticipated.  Collaboration of Care: Other: Continue to see providers at [place] [facility]  Patient/Guardian was advised Release of Information must be obtained prior to any record release in order to collaborate their care with an outside provider. Patient/Guardian  was advised if they have not already done so to contact the registration department to sign all necessary forms in order for [name] to release information regarding their care.   Consent: Patient/Guardian gives verbal consent for treatment and assignment of benefits for services provided during this visit. Patient/Guardian expressed understanding and agreed to proceed.      I provided 90 minutes of non-face-to-face time during this encounter.   Reygan Heagle S, LCAS              03/18/23

## 2023-03-19 ENCOUNTER — Encounter (HOSPITAL_COMMUNITY): Payer: Self-pay | Admitting: Licensed Clinical Social Worker

## 2023-03-19 NOTE — Progress Notes (Signed)
 Virtual Visit via Video Note  I connected with Mark [name] on 03/13/23 at 1-2pm EST by video-enabled telemedicine with the correct person using two identifiers.   I discussed the limitations of evaluation and management by telemedicine and the availability of in person appointments. The patient expressed understanding and agreed to proceed.  LOCATION Patient: home Provider: home office    History of Present Illness: Pt was referred to [facility] OP therapy for PTSD, depression and anxiety by the [facility].  Treatment Goal Addressed:   Pt will reduce irritability and increase normal social interaction with family and friends and will develop the ability to recognize, accept, and cope with feelings of depression and anxiety by using practiced coping skills for depression 1-2x daily AEB self report.  Progress towards Goal:  Progressing    Observations/Objective: Pt presented for today's session on time and was alert, oriented x5, with no evidence or self-report of SI/HI or A/V H.  Patient reported ongoing compliance with medication and denied any use of alcohol or illicit substances.  Clinician inquired about patient's current emotional ratings, as well as any significant changes in thoughts, feelings or behavior since previous session. Patient reported scores of 7/10 for depression, 8/10 for anxiety, 3/10 for anger/irritability, and denied any reoccurrence of panic attacks.  Pt identified stressors that occurred in his life over the past week. Cln and pt reviewed each stressor and coping skills used over the past week: grounding and breathing exercises:  packing up house, moving back to [place], family, moving back to toxic environment, giving away ESA,  financial issues, chronic pain,  pain in back due to blisters, (Will eventually need surgery), amputee disability. Cln asked open ended questions. Pt reports "I continue feeling overwhelmed this week with work and family issues." Cln provided  psyhoeducaction about being productive even when physical health is poor. Cln used CBT to help pt process thoughts, feelings, and behaviors. Cln provided supportive feedback.    Collaboration of Care: Other: Continue seeing providers at [place].    Patient/Guardian was advised Release of Information must be obtained prior to any record release in order to collaborate their care with an outside provider. Patient/Guardian was advised if they have not already done so to contact the registration department to sign all necessary forms in order for [name] to release information regarding their care.    Consent: Patient/Guardian gives verbal consent for treatment and assignment of benefits for services provided during this visit. Patient/Guardian expressed understanding and agreed to proceed.     Assessment and Plan: Counselor will continue to meet with patient and address treatment plan goals. Patient was given a chance for recommendations of providers and implement skills learned in session and encouraged to practice coping skills between sessions, practicing between sessions.Diagnosis: anxiety, depression and ptsd  Follow Up Instructions I discussed the assessment and treatment plan with the patient. The patient was provided an opportunity to ask questions and all were answered. The patient agreed with the plan and demonstrated an understanding of the instructions.   The patient was advised to call back or seek an in-person evaluation if the symptoms worsen or if the condition fails to improve as anticipated.  I provided 60 minutes of non-face-to-face time during this encounter.   Nerissa Constantin S, LCAS  .

## 2023-03-20 ENCOUNTER — Ambulatory Visit (HOSPITAL_COMMUNITY): Payer: No Typology Code available for payment source | Admitting: Licensed Clinical Social Worker

## 2023-03-20 DIAGNOSIS — F419 Anxiety disorder, unspecified: Secondary | ICD-10-CM

## 2023-03-20 DIAGNOSIS — F4310 Post-traumatic stress disorder, unspecified: Secondary | ICD-10-CM

## 2023-03-20 DIAGNOSIS — F331 Major depressive disorder, recurrent, moderate: Secondary | ICD-10-CM | POA: Diagnosis not present

## 2023-03-21 ENCOUNTER — Ambulatory Visit (HOSPITAL_COMMUNITY): Payer: No Typology Code available for payment source | Admitting: Licensed Clinical Social Worker

## 2023-03-22 ENCOUNTER — Encounter (HOSPITAL_COMMUNITY): Payer: Self-pay | Admitting: Licensed Clinical Social Worker

## 2023-03-22 NOTE — Progress Notes (Signed)
 Virtual Visit via Video Note  I connected with Mark [name] on 03/20/23 at 1-1:45pm EST by video-enabled telemedicine with the correct person using two identifiers.   I discussed the limitations of evaluation and management by telemedicine and the availability of in person appointments. The patient expressed understanding and agreed to proceed.  LOCATION Patient: home Provider: home office    History of Present Illness: Pt was referred to [facility] OP therapy for PTSD, depression and anxiety by the [facility].  Treatment Goal Addressed:   Pt will reduce irritability and increase normal social interaction with family and friends and will develop the ability to recognize, accept, and cope with feelings of depression and anxiety by using practiced coping skills for depression 1-2x daily AEB self report.  Progress towards Goal:  Progressing    Observations/Objective: Pt presented for today's session on time and was alert, oriented x5, with no evidence or self-report of SI/HI or A/V H.  Patient reported ongoing compliance with medication and denied any use of alcohol or illicit substances.  Clinician inquired about patient's current emotional ratings, as well as any significant changes in thoughts, feelings or behavior since previous session. Patient reported scores of 7/10 for depression, 8/10 for anxiety, 3/10 for anger/irritability, and denied any reoccurrence of panic attacks.  Pt identified stressors that occurred in his life over the past week. Cln and pt reviewed each stressor and coping skills used over the past week: grounding and breathing exercises:  packing up house, moving back to [place], family, moving back to toxic environment, giving away ESA,  financial issues, chronic pain,  pain in back due to blisters, amputee disability. Cln asked open ended questions.  Pt presents sick today. "Im sick on my stomach."  Cln provided psyhoeducaction about being productive even when physical health is  poor. Cln pt take the rest of the day off for self care.  Collaboration of Care: Other: Continue seeing providers at [place].    Patient/Guardian was advised Release of Information must be obtained prior to any record release in order to collaborate their care with an outside provider. Patient/Guardian was advised if they have not already done so to contact the registration department to sign all necessary forms in order for [name] to release information regarding their care.    Consent: Patient/Guardian gives verbal consent for treatment and assignment of benefits for services provided during this visit. Patient/Guardian expressed understanding and agreed to proceed.     Assessment and Plan: Counselor will continue to meet with patient and address treatment plan goals. Patient was given a chance for recommendations of providers and implement skills learned in session and encouraged to practice coping skills between sessions, practicing between sessions.Diagnosis: anxiety, depression and ptsd  Follow Up Instructions I discussed the assessment and treatment plan with the patient. The patient was provided an opportunity to ask questions and all were answered. The patient agreed with the plan and demonstrated an understanding of the instructions.   The patient was advised to call back or seek an in-person evaluation if the symptoms worsen or if the condition fails to improve as anticipated.  I provided 45 minutes of non-face-to-face time during this encounter.   Gilmar Bua S, LCAS  .

## 2023-03-25 ENCOUNTER — Ambulatory Visit (INDEPENDENT_AMBULATORY_CARE_PROVIDER_SITE_OTHER): Payer: No Typology Code available for payment source | Admitting: Licensed Clinical Social Worker

## 2023-03-25 ENCOUNTER — Encounter (HOSPITAL_COMMUNITY): Payer: Self-pay | Admitting: Licensed Clinical Social Worker

## 2023-03-25 DIAGNOSIS — F331 Major depressive disorder, recurrent, moderate: Secondary | ICD-10-CM

## 2023-03-25 DIAGNOSIS — F4310 Post-traumatic stress disorder, unspecified: Secondary | ICD-10-CM

## 2023-03-25 DIAGNOSIS — F419 Anxiety disorder, unspecified: Secondary | ICD-10-CM

## 2023-03-25 DIAGNOSIS — F32A Depression, unspecified: Secondary | ICD-10-CM

## 2023-03-25 NOTE — Progress Notes (Signed)
 Virtual Visit via Video Group Note  I connected with Mark [name] on 03/25/23 at 5:00-6:30pm EST by a video enabled telemedicine application and verified that I am speaking with the correct person using two identifiers.   I discussed the limitations of evaluation and management by telemedicine and the availability of in person appointments. The patient expressed understanding and agreed to proceed.  LOCATION: Patient: Home Provider: home Office  History of Present Illness: [facility], [facility], [facility].  Treatment Goal Addressed:  Pt will develop the ability to recognize, accept, and cope with feelings of depression AEB self report.  Progress towards Goal:  Progressing    Observation/Objective:  Patient participated in a group discussion on feeling emotionally overwhelmed. Patient was encouraged to use coping skills, soothing activities and grounding to help with self-soothing. Patient was encouraged to use the self-soothing activities at home between sessions when feeling overwhelmed emotionally.   2025 WORD: INTENTIONAL:    Assessment and Plan:  Counselor will continue to meet with patient to address treatment plan goals. Patient will continue to follow recommendations of providers and implement skills learned in session.    Follow-up instructions:I discussed the assessment and treatment plan with the patient. Patient was provided an opportunity to ask questions and all were answered. The patient agreed with the plan and demonstrated an understanding of the instructions.   The patient was advised to call back or seek an in-person evaluation if the symptoms worsen or if the condition fails to improve as anticipated.  Collaboration of Care: Other: Continue to see providers at [facility]  Patient/Guardian was advised Release of Information must be obtained prior to any record release in order to collaborate their care with an outside  provider. Patient/Guardian was advised if they have not already done so to contact the registration department to sign all necessary forms in order for [name] to release information regarding their care.   Consent: Patient/Guardian gives verbal consent for treatment and assignment of benefits for services provided during this visit. Patient/Guardian expressed understanding and agreed to proceed.      I provided 90 minutes of non-face-to-face time during this encounter.   Markie Heffernan S, LCAS              03/25/23

## 2023-03-27 ENCOUNTER — Telehealth: Payer: Self-pay | Admitting: *Deleted

## 2023-03-27 ENCOUNTER — Ambulatory Visit (INDEPENDENT_AMBULATORY_CARE_PROVIDER_SITE_OTHER): Payer: No Typology Code available for payment source | Admitting: Licensed Clinical Social Worker

## 2023-03-27 DIAGNOSIS — F4310 Post-traumatic stress disorder, unspecified: Secondary | ICD-10-CM | POA: Diagnosis not present

## 2023-03-27 DIAGNOSIS — F331 Major depressive disorder, recurrent, moderate: Secondary | ICD-10-CM

## 2023-03-27 DIAGNOSIS — F419 Anxiety disorder, unspecified: Secondary | ICD-10-CM

## 2023-03-27 NOTE — Telephone Encounter (Signed)
 Patient was identified as falling into the [place] Measure - Diabetes.   Patient was: Patient refuses intervention. . Patient is not a patient in [facility] has transferred care to [facility].

## 2023-04-01 ENCOUNTER — Ambulatory Visit (INDEPENDENT_AMBULATORY_CARE_PROVIDER_SITE_OTHER): Payer: No Typology Code available for payment source | Admitting: Licensed Clinical Social Worker

## 2023-04-01 ENCOUNTER — Encounter (HOSPITAL_COMMUNITY): Payer: Self-pay | Admitting: Licensed Clinical Social Worker

## 2023-04-01 DIAGNOSIS — F331 Major depressive disorder, recurrent, moderate: Secondary | ICD-10-CM

## 2023-04-01 DIAGNOSIS — F419 Anxiety disorder, unspecified: Secondary | ICD-10-CM

## 2023-04-01 DIAGNOSIS — F4310 Post-traumatic stress disorder, unspecified: Secondary | ICD-10-CM

## 2023-04-01 NOTE — Progress Notes (Signed)
 Virtual Visit via Video Group Note  I connected with Mark [name] on 04/01/23 at 5:00-6:30pm EST by a video enabled telemedicine application and verified that I am speaking with the correct person using two identifiers.   I discussed the limitations of evaluation and management by telemedicine and the availability of in person appointments. The patient expressed understanding and agreed to proceed.  LOCATION: Patient: Home Provider: home Office  History of Present Illness: [facility], depression, anxiety.  Treatment Goal Addressed:  Pt will develop the ability to recognize, accept, and cope with feelings of depression AEB self report.  Progress towards Goal:  Progressing    Observation/Objective:  Patient participated in a discussion on dealing with chronic pain. Clinician used CBT to assist patient in improving quality of life, activities of daily living, focusing on reducing pain and distress by modifying physical sensation, catastrophic thinking and maladaptive behaviors. Patient was encouraged to use CBT to assist in improving quality of life.          2025 WORD: INTENTIONAL:    Assessment and Plan:  Counselor will continue to meet with patient to address treatment plan goals. Patient will continue to follow recommendations of providers and implement skills learned in session.    Follow-up instructions:I discussed the assessment and treatment plan with the patient. Patient was provided an opportunity to ask questions and all were answered. The patient agreed with the plan and demonstrated an understanding of the instructions.   The patient was advised to call back or seek an in-person evaluation if the symptoms worsen or if the condition fails to improve as anticipated.  Collaboration of Care: Other: Continue to see providers at [facility]  Patient/Guardian was advised Release of Information must be obtained prior to any  record release in order to collaborate their care with an outside provider. Patient/Guardian was advised if they have not already done so to contact the registration department to sign all necessary forms in order for [name] to release information regarding their care.   Consent: Patient/Guardian gives verbal consent for treatment and assignment of benefits for services provided during this visit. Patient/Guardian expressed understanding and agreed to proceed.      I provided 90 minutes of non-face-to-face time during this encounter.   Bharat Antillon S, LCAS              04/01/23

## 2023-04-01 NOTE — Progress Notes (Signed)
 Virtual Visit via Video Note  I connected with Mark [name] on 03/27/23 at 1-2pm EST by video-enabled telemedicine with the correct person using two identifiers.   I discussed the limitations of evaluation and management by telemedicine and the availability of in person appointments. The patient expressed understanding and agreed to proceed.  LOCATION Patient: home Provider: home office    History of Present Illness: Pt was referred to [facility] OP therapy for PTSD, depression and anxiety by the [facility].  Treatment Goal Addressed:   Pt will reduce irritability and increase normal social interaction with family and friends and will develop the ability to recognize, accept, and cope with feelings of depression and anxiety by using practiced coping skills for depression 1-2x daily AEB self report.  Progress towards Goal:  Progressing    Observations/Objective: Pt presented for today's session on time and was alert, oriented x5, with no evidence or self-report of SI/HI or A/V H.  Patient reported ongoing compliance with medication and denied any use of alcohol or illicit substances.  Clinician inquired about patient's current emotional ratings, as well as any significant changes in thoughts, feelings or behavior since previous session. Patient reported scores of 7/10 for depression, 8/10 for anxiety, 3/10 for anger/irritability, and denied any reoccurrence of panic attacks.  Pt identified stressors that occurred in his life over the past week. Cln and pt reviewed each stressor and coping skills used over the past week: grounding and breathing exercises:  packing up house, moving back to [place], family, moving back to toxic environment, giving away ESA,  financial issues, chronic pain,  pain in back due to blisters, amputee disability. .Cln provided thought stopping tools, as well as reality testing to provide Cln utilized CBT to address thought processes support and confidence in his decisions.      Collaboration of Care: Other: Continue seeing providers at [place].    Patient/Guardian was advised Release of Information must be obtained prior to any record release in order to collaborate their care with an outside provider. Patient/Guardian was advised if they have not already done so to contact the registration department to sign all necessary forms in order for [name] to release information regarding their care.    Consent: Patient/Guardian gives verbal consent for treatment and assignment of benefits for services provided during this visit. Patient/Guardian expressed understanding and agreed to proceed.     Assessment and Plan: Counselor will continue to meet with patient and address treatment plan goals. Patient was given a chance for recommendations of providers and implement skills learned in session and encouraged to practice coping skills between sessions, practicing between sessions.Diagnosis: anxiety, depression and ptsd  Follow Up Instructions I discussed the assessment and treatment plan with the patient. The patient was provided an opportunity to ask questions and all were answered. The patient agreed with the plan and demonstrated an understanding of the instructions.   The patient was advised to call back or seek an in-person evaluation if the symptoms worsen or if the condition fails to improve as anticipated.  I provided 60 minutes of non-face-to-face time during this encounter.   Mark [name] S, LCAS  .

## 2023-04-03 ENCOUNTER — Ambulatory Visit (HOSPITAL_COMMUNITY): Payer: No Typology Code available for payment source | Admitting: Licensed Clinical Social Worker

## 2023-04-03 DIAGNOSIS — F419 Anxiety disorder, unspecified: Secondary | ICD-10-CM | POA: Diagnosis not present

## 2023-04-03 DIAGNOSIS — F4310 Post-traumatic stress disorder, unspecified: Secondary | ICD-10-CM

## 2023-04-03 DIAGNOSIS — F331 Major depressive disorder, recurrent, moderate: Secondary | ICD-10-CM

## 2023-04-05 ENCOUNTER — Emergency Department (HOSPITAL_COMMUNITY)
Admission: EM | Admit: 2023-04-05 | Discharge: 2023-04-05 | Disposition: A | Discharge status: Home / Self Care | Attending: Emergency Medicine | Admitting: Emergency Medicine

## 2023-04-05 ENCOUNTER — Other Ambulatory Visit: Payer: Self-pay

## 2023-04-05 DIAGNOSIS — L0501 Pilonidal cyst with abscess: Secondary | ICD-10-CM | POA: Insufficient documentation

## 2023-04-05 DIAGNOSIS — Z794 Long term (current) use of insulin: Secondary | ICD-10-CM | POA: Diagnosis not present

## 2023-04-05 MED ORDER — MORPHINE SULFATE 20 MG/5ML PO SOLN
10.0000 mg | ORAL | 0 refills | Status: AC | PRN
Start: 2023-04-05 — End: ?

## 2023-04-05 MED ORDER — LIDOCAINE HCL 2 % IJ SOLN
20.0000 mL | Freq: Once | INTRAMUSCULAR | Status: AC
  Administered 2023-04-05: 400 mg
  Filled 2023-04-05: qty 20

## 2023-04-05 MED ORDER — DOXYCYCLINE HYCLATE 100 MG PO TABS
100.0000 mg | ORAL_TABLET | Freq: Once | ORAL | Status: AC
  Administered 2023-04-05: 100 mg via ORAL
  Filled 2023-04-05: qty 1

## 2023-04-05 MED ORDER — OXYCODONE-ACETAMINOPHEN 5-325 MG PO TABS
2.0000 | ORAL_TABLET | Freq: Once | ORAL | Status: AC
  Administered 2023-04-05: 2 via ORAL
  Filled 2023-04-05: qty 2

## 2023-04-05 MED ORDER — DOXYCYCLINE HYCLATE 100 MG PO CAPS: 100.0000 mg | ORAL_CAPSULE | Freq: Two times a day (BID) | ORAL | 0 refills | Status: AC

## 2023-04-05 NOTE — ED Provider Notes (Signed)
 [place] EMERGENCY DEPARTMENT AT [facility] Provider Note   CSN: [id number] Arrival date & time: 04/05/23  1517     History  Chief Complaint  Patient presents with   Cyst    Mark [name] is a 63 y.o. male here presenting with concern for possible pilonidal abscess versus cyst.  Patient has a history of pilonidal cyst that required multiple surgeries.  At one point patient even had a wound VAC.  He states that he is wheelchair-bound.  He noticed pain in the pilonidal area.  Patient denies any fevers.  He states that he is checked his sugar today with around 90.  The history is provided by the patient.       Home Medications Prior to Admission medications   Medication Sig Start Date End Date Taking? Authorizing Provider  ARIPiprazole (ABILIFY) 10 MG tablet Take 10 mg by mouth daily.    [provider]  buPROPion (WELLBUTRIN XL) 150 MG 24 hr tablet Take 1 tablet (150 mg total) by mouth daily. 04/27/21   Thresa Ross, MD  busPIRone (BUSPAR) 15 MG tablet Take 1 tablet (15 mg total) by mouth 3 (three) times daily. 08/19/19   Arfeen, Phillips Grout, MD  carvedilol (COREG) 3.125 MG tablet Take 1 tablet (3.125 mg total) by mouth 2 (two) times daily with a meal. 05/24/22   Jacalyn Lefevre, MD  clindamycin (CLEOCIN T) 1 % external solution Apply 1 Application topically 2 (two) times daily.    [provider]  cyclobenzaprine (FLEXERIL) 10 MG tablet Take 10 mg by mouth 2 (two) times daily as needed for muscle spasms. 10/04/20   [provider]  diclofenac Sodium (VOLTAREN) 1 % GEL Apply 2 g topically daily as needed (pain). 10/04/20   [provider]  doxycycline (VIBRAMYCIN) 100 MG capsule Take 1 capsule (100 mg total) by mouth 2 (two) times daily. 11/14/22   Sabas Sous, MD  escitalopram (LEXAPRO) 20 MG tablet Take 20 mg by mouth daily.    [provider]  furosemide (LASIX) 40 MG tablet Take 1 tablet (40 mg total) by mouth daily. 11/14/22    Sabas Sous, MD  hydrOXYzine (VISTARIL) 25 MG capsule Take 25-50 mg by mouth 2 (two) times daily as needed for anxiety.    [provider]  insulin aspart (NOVOLOG) 100 UNIT/ML injection Inject 15 Units into the skin 3 (three) times daily before meals. 07/25/21   Lyndle Herrlich, MD  insulin glargine (LANTUS) 100 UNIT/ML injection Inject 0.3 mLs (30 Units total) into the skin 2 (two) times daily. 07/25/21   Lyndle Herrlich, MD  losartan (COZAAR) 50 MG tablet Take 1 tablet (50 mg total) by mouth daily. 05/24/22   Jacalyn Lefevre, MD  Menthol-Methyl Salicylate (THERA-GESIC) 1-15 % CREA Apply 1 Application topically 2 (two) times daily as needed for pain. 03/23/21   [provider]  modafinil (PROVIGIL) 100 MG tablet Take 100 mg by mouth daily.    [provider]  ondansetron (ZOFRAN) 4 MG tablet Take 1 tablet (4 mg total) by mouth every 6 (six) hours as needed for nausea. 07/25/21   Lyndle Herrlich, MD  prazosin (MINIPRESS) 2 MG capsule Take 1 capsule (2 mg total) by mouth at bedtime. 03/24/21   Thresa Ross, MD  sildenafil (VIAGRA) 100 MG tablet Take 100 mg by mouth daily as needed for erectile dysfunction.    [provider]  silver sulfADIAZINE (SILVADENE) 1 % cream Apply 1 Application topically daily as needed (affected area).  [provider]  traZODone (DESYREL) 50 MG tablet Take 1 tablet (50 mg total) by mouth at bedtime as needed. Patient taking differently: Take 50 mg by mouth at bedtime as needed for sleep. 08/19/19   Arfeen, Phillips Grout, MD      Allergies    Ozempic (0.25 or 0.5 mg-dose) [semaglutide(0.25 or 0.5mg -dos)], Semaglutide, and Lyrica [pregabalin]    Review of Systems   Review of Systems  Skin:  Positive for wound.  All other systems reviewed and are negative.   Physical Exam Updated Vital Signs BP (!) 137/91 (BP Location: Right Arm)   Pulse 84   Temp 98.9 F (37.2 C) (Oral)   Resp 20   Ht 6\' 1"  (1.854 m)   Wt  (!) 146.5 kg   SpO2 99%   BMI 42.61 kg/m  Physical Exam Vitals and nursing note reviewed.  Constitutional:      Comments: Chronically ill  HENT:     Head: Normocephalic.     Nose: Nose normal.     Mouth/Throat:     Mouth: Mucous membranes are moist.  Eyes:     Extraocular Movements: Extraocular movements intact.     Pupils: Pupils are equal, round, and reactive to light.  Cardiovascular:     Rate and Rhythm: Normal rate.     Pulses: Normal pulses.     Heart sounds: Normal heart sounds.  Pulmonary:     Effort: Pulmonary effort is normal.     Breath sounds: Normal breath sounds.  Abdominal:     General: Abdomen is flat.     Palpations: Abdomen is soft.  Musculoskeletal:     Cervical back: Normal range of motion.  Skin:    Comments: Patient has complicated pilonidal cyst removal scar in the midline.  Patient does have Purulent drainage on the inferior aspect of it.  There are also some swelling on the superior aspect of it.  Some localized erythema as well  Neurological:     General: No focal deficit present.     Mental Status: He is oriented to person, place, and time.     Comments: Patient is wheelchair-bound which is baseline  Psychiatric:        Mood and Affect: Mood normal.        Behavior: Behavior normal.     ED Results / Procedures / Treatments   Labs (all labs ordered are listed, but only abnormal results are displayed) Labs Reviewed - No data to display  EKG None  Radiology No results found.  Procedures Procedures    INCISION AND DRAINAGE Performed by: Richardean Canal Consent: Verbal consent obtained. Risks and benefits: risks, benefits and alternatives were discussed Type: abscess  Body area: Pilonidal area  Anesthesia: local infiltration  Incision was made with a scalpel.  Local anesthetic: lidocaine 2% no epinephrine  Anesthetic total: 10 ml  Complexity: complex Blunt dissection to break up loculations  Drainage: purulent  Drainage  amount: scant  Packing material: none  Patient tolerance: Patient tolerated the procedure well with no immediate complications.    Medications Ordered in ED Medications  lidocaine (XYLOCAINE) 2 % (with pres) injection 400 mg (has no administration in time range)  oxyCODONE-acetaminophen (PERCOCET/ROXICET) 5-325 MG per tablet 2 tablet (2 tablets Oral Given 04/05/23 1744)  doxycycline (VIBRA-TABS) tablet 100 mg (100 mg Oral Given 04/05/23 1744)    ED Course/ Medical Decision Making/ A&P  Medical Decision Making Mark [name] is a 63 y.o. male history of pilonidal abscess here presenting with swelling around the incision area.  Patient had a small purulent drainage on the inferior aspect and some swelling on the superior aspect.  Bedside ultrasound showed possible abscess.  I attempted I&D on the superior aspect and nothing came out so likely just scar tissue.  On the inferior aspect, I also performed I&D and there is some purulent discharge. Patient states that he usually gets doxycycline.  At this point I do not think he has a deep abscess.  He is well-appearing and I applied dressing on it.  Patient will be discharged with 2 weeks of doxycycline and surgery follow-up.  Gave strict return precautions.  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    Final Clinical Impression(s) / ED Diagnoses Final diagnoses:  None    Rx / DC Orders ED Discharge Orders          Ordered    Home Health        04/05/23 1829    Face-to-face encounter (required for Medicare/Medicaid patients)       Comments: I Richardean Canal certify that this patient is under my care and that I, or a nurse practitioner or physician's assistant working with me, had a face-to-face encounter that  meets the physician face-to-face encounter requirements with this patient on 04/05/2023. The encounter with the patient was in whole, or in part for the following medical condition(s) which is the primary reason for home health care (List medical condition): pilonidal abscess, further orders from PCP   04/05/23 1829              Charlynne Pander, MD 04/05/23 [id number]

## 2023-04-05 NOTE — Care Management (Signed)
 Transition of Care [facility]) - Emergency Department Mini Assessment   Patient Details  Name: Mark [name] MRN: [id number] Date of Birth: 12-Aug-1960  [facility] CM/SW Contact:    Lavenia Atlas, RN Phone Number: 04/05/2023, 6:47 PM   Clinical Narrative: Received TOC consult for [facility] services. Per chart review patient presented to [facility] ED who is wheelchair bound. This RNCM spoke with patient who reports he has used a HH agency in the past, unsure which agency.  This RNCM notified  -Lynette with [facility], awaiting a response.  - Amy w/ [facility], awaiting a response - Cheryl w/ [facility], awaiting a response  Received response from Reardan w/[facility] who will follow patient post discharge for [facility] dressing change/woundcare.  Transportation at discharge: patient reports has transportation.   TOC following for discharge   ED Mini Assessment: What brought you to the Emergency Department? : Patient c/o pilonidal cyst that is bleeding and causing pain  Barriers to Discharge: No Barriers Identified  Barrier interventions: coordinating HH services  Means of departure: Car  Interventions which prevented an admission or readmission: Home Health Consult or Services    Patient Contact and Communications        ,          Patient states their goals for this hospitalization and ongoing recovery are:: to feel better CMS Medicare.gov Compare Post Acute Care list provided to:: Patient Choice offered to / list presented to : Patient  Admission diagnosis:  Cyst with Bleeding Patient Active Problem List   Diagnosis Date Noted   Nausea and vomiting    Left upper quadrant abdominal pain    DKA (diabetic ketoacidosis) (HCC) 07/21/2021   Executive function deficit 02/16/2021   Cellulitis 12/01/2020   Sepsis due to cellulitis (HCC) 11/30/2020   Cellulitis and abscess of buttock 10/14/2020   Sepsis (HCC) 10/14/2020   Hyperglycemia 10/14/2020   Abscess    Pressure injury of  skin 12/08/2019   Diabetic ketoacidosis associated with type 2 diabetes mellitus (HCC) 12/04/2019   AKI (acute kidney injury) (HCC) 12/04/2019   Depression with anxiety 12/04/2019   Hypertension associated with diabetes (HCC) 12/04/2019   Hyperlipidemia associated with type 2 diabetes mellitus (HCC) 12/04/2019   Cellulitis of left lower extremity 12/04/2019   Cellulitis of lower back 12/04/2019   PTSD (post-traumatic stress disorder) 04/15/2017   Major depressive disorder, recurrent episode, moderate (HCC) 04/30/2016   Generalized anxiety disorder 04/30/2016   PCP:  Clinic, [name] Pharmacy:   [facility] [phone] - [place], [place] - [address] AT [facility] [address] [facility] [address] [place] [address] Phone: [phone] Fax: [phone]  [facility] [address] [place] [address] Phone: [phone] Fax: [phone]

## 2023-04-05 NOTE — Discharge Instructions (Addendum)
 As we discussed, you likely have skin infection around your pilonidal area  I have drained the abscess but you have some complicated scars.  I recommend you take doxycycline twice a day for 2 weeks.  I have also prescribed morphine for pain  Please call surgery office for follow-up  I have contacted the case manager and they will try and send someone to your home for wound care  If the dressing gets soaked, I recommend that you change it and apply bandage onto it  Return to ER if you have severe pain, vomiting, fever, purulent drainage

## 2023-04-05 NOTE — ED Triage Notes (Signed)
 Pt has a pilonidal cyst that is bleeding and causing pain-

## 2023-04-08 ENCOUNTER — Ambulatory Visit (HOSPITAL_COMMUNITY): Payer: No Typology Code available for payment source | Admitting: Licensed Clinical Social Worker

## 2023-04-08 ENCOUNTER — Encounter (HOSPITAL_COMMUNITY): Payer: Self-pay | Admitting: Licensed Clinical Social Worker

## 2023-04-08 DIAGNOSIS — F331 Major depressive disorder, recurrent, moderate: Secondary | ICD-10-CM

## 2023-04-08 DIAGNOSIS — F32A Depression, unspecified: Secondary | ICD-10-CM | POA: Diagnosis not present

## 2023-04-08 DIAGNOSIS — F4310 Post-traumatic stress disorder, unspecified: Secondary | ICD-10-CM

## 2023-04-08 DIAGNOSIS — F419 Anxiety disorder, unspecified: Secondary | ICD-10-CM | POA: Diagnosis not present

## 2023-04-08 NOTE — Progress Notes (Signed)
 Virtual Visit via Video Group Note  I connected with Mark [name] on 04/08/23 at 5:00-6:30pm EST by a video enabled telemedicine application and verified that I am speaking with the correct person using two identifiers.   I discussed the limitations of evaluation and management by telemedicine and the availability of in person appointments. The patient expressed understanding and agreed to proceed.  LOCATION: Patient: Home Provider: home Office  History of Present Illness: [facility]  Treatment Goal Addressed:  Pt will develop the ability to recognize, accept, and cope with feelings of anxiety AEB self report.  Progress towards Goal:  Progressing    Observation/Objective:   Patient participated in a group discussion on anxiety. Clinician provided education on the cycle of anxiety, anxiety medications available and  alternatives for anxiety.  Patient was encouraged to know triggers, symptoms and available coping skills.       2025 WORD: INTENTIONAL:    Assessment and Plan:  Counselor will continue to meet with patient to address treatment plan goals. Patient will continue to follow recommendations of providers and implement skills learned in session.    Follow-up instructions:I discussed the assessment and treatment plan with the patient. Patient was provided an opportunity to ask questions and all were answered. The patient agreed with the plan and demonstrated an understanding of the instructions.   The patient was advised to call back or seek an in-person evaluation if the symptoms worsen or if the condition fails to improve as anticipated.  Collaboration of Care: Other: Continue to see providers at [facility]  Patient/Guardian was advised Release of Information must be obtained prior to any record release in order to collaborate their care with an outside provider. Patient/Guardian was advised if they have  not already done so to contact the registration department to sign all necessary forms in order for [name] to release information regarding their care.   Consent: Patient/Guardian gives verbal consent for treatment and assignment of benefits for services provided during this visit. Patient/Guardian expressed understanding and agreed to proceed.      I provided 90 minutes of non-face-to-face time during this encounter.   Dellia Donnelly S, LCAS              04/08/23

## 2023-04-10 ENCOUNTER — Ambulatory Visit (INDEPENDENT_AMBULATORY_CARE_PROVIDER_SITE_OTHER): Payer: No Typology Code available for payment source | Admitting: Licensed Clinical Social Worker

## 2023-04-10 ENCOUNTER — Encounter (HOSPITAL_COMMUNITY): Payer: Self-pay | Admitting: Licensed Clinical Social Worker

## 2023-04-10 DIAGNOSIS — F4310 Post-traumatic stress disorder, unspecified: Secondary | ICD-10-CM

## 2023-04-10 DIAGNOSIS — F411 Generalized anxiety disorder: Secondary | ICD-10-CM

## 2023-04-10 DIAGNOSIS — F331 Major depressive disorder, recurrent, moderate: Secondary | ICD-10-CM

## 2023-04-10 NOTE — Progress Notes (Signed)
 Virtual Visit via Video Note  I connected with Mark [name] on 04/03/23 at 1-2pm EST by video-enabled telemedicine with the correct person using two identifiers.   I discussed the limitations of evaluation and management by telemedicine and the availability of in person appointments. The patient expressed understanding and agreed to proceed.  LOCATION Patient: home Provider: home office    History of Present Illness: Pt was referred to [facility] OP therapy for PTSD, depression and anxiety by the [facility].  Treatment Goal Addressed:   Pt will reduce irritability and increase normal social interaction with family and friends and will develop the ability to recognize, accept, and cope with feelings of depression and anxiety by using practiced coping skills for depression 1-2x daily AEB self report.  Progress towards Goal:  Progressing    Observations/Objective: Pt presented for today's session on time and was alert, oriented x5, with no evidence or self-report of SI/HI or A/V H.  Patient reported ongoing compliance with medication and denied any use of alcohol or illicit substances.  Clinician inquired about patient's current emotional ratings, as well as any significant changes in thoughts, feelings or behavior since previous session. Patient reported scores of 7/10 for depression, 8/10 for anxiety, 3/10 for anger/irritability, and denied any panic attacks.  Pt identified stressors that occurred in his life over the past week. Cln and pt reviewed each stressor and coping skills used over the past week: grounding and breathing exercises:  packing up house, moving back to [place], family, moving back to toxic environment, giving away ESA,  financial issues, chronic pain,  pain in back due to blisters, amputee disability. Cln provided psychoeducation  on using mindfulness-based stress response to assist patient in lowering the stress response. Patient was encouraged to use mindfulness skills as a coping  skill to assist in lowering stress response.      Collaboration of Care: Other: Continue seeing providers at [place].    Patient/Guardian was advised Release of Information must be obtained prior to any record release in order to collaborate their care with an outside provider. Patient/Guardian was advised if they have not already done so to contact the registration department to sign all necessary forms in order for [name] to release information regarding their care.    Consent: Patient/Guardian gives verbal consent for treatment and assignment of benefits for services provided during this visit. Patient/Guardian expressed understanding and agreed to proceed.     Assessment and Plan: Counselor will continue to meet with patient and address treatment plan goals. Patient was given a chance for recommendations of providers and implement skills learned in session and encouraged to practice coping skills between sessions, practicing between sessions.Diagnosis: anxiety, depression and ptsd  Follow Up Instructions I discussed the assessment and treatment plan with the patient. The patient was provided an opportunity to ask questions and all were answered. The patient agreed with the plan and demonstrated an understanding of the instructions.   The patient was advised to call back or seek an in-person evaluation if the symptoms worsen or if the condition fails to improve as anticipated.  I provided 60 minutes of non-face-to-face time during this encounter.   Aurora Rody S, LCAS  .

## 2023-04-15 ENCOUNTER — Ambulatory Visit (HOSPITAL_COMMUNITY): Payer: No Typology Code available for payment source | Admitting: Licensed Clinical Social Worker

## 2023-04-17 ENCOUNTER — Ambulatory Visit (HOSPITAL_COMMUNITY): Payer: No Typology Code available for payment source | Admitting: Licensed Clinical Social Worker

## 2023-04-17 ENCOUNTER — Encounter (HOSPITAL_COMMUNITY): Payer: Self-pay | Admitting: Licensed Clinical Social Worker

## 2023-04-17 ENCOUNTER — Encounter: Payer: Self-pay | Admitting: Dietician

## 2023-04-17 ENCOUNTER — Encounter: Attending: Internal Medicine | Admitting: Dietician

## 2023-04-17 VITALS — Wt 340.0 lb

## 2023-04-17 DIAGNOSIS — Z794 Long term (current) use of insulin: Secondary | ICD-10-CM | POA: Diagnosis present

## 2023-04-17 DIAGNOSIS — F419 Anxiety disorder, unspecified: Secondary | ICD-10-CM | POA: Diagnosis not present

## 2023-04-17 DIAGNOSIS — E1165 Type 2 diabetes mellitus with hyperglycemia: Secondary | ICD-10-CM | POA: Diagnosis present

## 2023-04-17 DIAGNOSIS — F4310 Post-traumatic stress disorder, unspecified: Secondary | ICD-10-CM | POA: Diagnosis not present

## 2023-04-17 DIAGNOSIS — F331 Major depressive disorder, recurrent, moderate: Secondary | ICD-10-CM | POA: Diagnosis not present

## 2023-04-17 NOTE — Progress Notes (Signed)
 Diabetes Self-Management Education  Visit Type: First/Initial  Appt. Start Time: 1400 Appt. End Time: 1500  04/17/2023  Mr. Mark [name], identified by name and date of birth, is a 63 y.o. male with a diagnosis of Diabetes: Type 2.   ASSESSMENT  History includes: anxiety, depression, type 2 diabetes, sleep apnea, HLD, HTN Labs noted: 03/15/23: A1c 9.3%, LDL 145,  Medications include: lantus, novolog, synjardy Supplements: ginger, turmeric  CGM: Freestyle Libre 3 Time in range (70-180 mg/dL): 55 % (Goal >40%) Time High (181-250 mg/dL) 30 % (Goal < 98%) Time Very High (>250 mg/dl) 13 % (Goal < 5%) Time Low (54-69 mg/dL): 2 % (Goal is <1%) Time Very Low (<54) 0%  (Goal <1%)  Pt states he bought a juicer and has been juicing over the last month. Pt reports drinking 10-16 oz juice daily made of carrots, cucumber, blackberries, strawberries, apple, and pineapple. Pt states he also bought ginger, maca, turmeric, and nitric oxide supplements.   Pt states he has dealt with weight gain over the last 4 months. Pt reports he has been struggling with depression more over this time. Pt states he feels depression effects his eating habits. Pt states prior to covid (2020) he feels he was doing well, working out at [employer] 2 days per week, feeling good in general, but states since the pandemic he feels things have gone downhill. Pt reports he would like to get back into [employer] and [facility], and potentially adaptive sports (pt with above knee amputation).   Pt states he often gets [employer] for dinner so he can talk with the delivery person, because as of right now the only day he leaves his house is Sundays for church. Pt reports he also enjoys cooking and finds it therapeutic. Pt has german shepard emotional support dog at home.   Pt current blood glucose is 92mg /dL.   Weight (!) 340 lb (154.2 kg). Body mass index is 44.86 kg/m.   Diabetes Self-Management  Education - 04/17/23 1402       Visit Information   Visit Type First/Initial      Initial Visit   Diabetes Type Type 2    Date Diagnosed 26 years ago    Are you currently following a meal plan? No    Are you taking your medications as prescribed? Yes      Health Coping   How would you rate your overall health? Poor      Psychosocial Assessment   Patient Belief/Attitude about Diabetes Defeat/Burnout    What is the hardest part about your diabetes right now, causing you the most concern, or is the most worrisome to you about your diabetes?   Making healty food and beverage choices;Being active    Self-care barriers None    Self-management support Doctor's office    Other persons present Patient    Patient Concerns Healthy Lifestyle    Special Needs None    Preferred Learning Style No preference indicated;Visual    Learning Readiness Ready    How often do you need to have someone help you when you read instructions, pamphlets, or other written materials from your doctor or pharmacy? 1 - Never    What is the last grade level you completed in school? masters      Pre-Education Assessment   Patient understands the diabetes disease and treatment process. Needs Instruction    Patient understands incorporating nutritional management into lifestyle. Needs Instruction    Patient undertands  incorporating physical activity into lifestyle. Needs Instruction    Patient understands using medications safely. Needs Instruction    Patient understands monitoring blood glucose, interpreting and using results Needs Instruction    Patient understands prevention, detection, and treatment of acute complications. Needs Instruction    Patient understands prevention, detection, and treatment of chronic complications. Needs Instruction    Patient understands how to develop strategies to address psychosocial issues. Needs Instruction    Patient understands how to develop strategies to promote health/change  behavior. Needs Instruction      Complications   Last HgB A1C per patient/outside source 9.3 %    How often do you check your blood sugar? > 4 times/day    Have you had a dilated eye exam in the past 12 months? Yes    Have you had a dental exam in the past 12 months? No    Are you checking your feet? Yes    How many days per week are you checking your feet? 7      Dietary Intake   Breakfast boost shake and 1 slice bread 7-10 strips    Snack (morning) none    Lunch bologna or roast beef sandwich    Snack (afternoon) cashews or peanuts    Dinner roast beef and pasta and collard greens OR cheesburger and fries OR general tso chicken and rice and spring rolls    Snack (evening) cashews or peanuts    Beverage(s) 16 oz homemade juice, 48 oz diet mt dew, zero sugar lemonade, 16 oz water      Activity / Exercise   Activity / Exercise Type ADL's    How many days per week do you exercise? 0    How many minutes per day do you exercise? 0    Total minutes per week of exercise 0      Patient Education   Previous Diabetes Education No    Disease Pathophysiology Definition of diabetes, type 1 and 2, and the diagnosis of diabetes;Factors that contribute to the development of diabetes;Explored patient's options for treatment of their diabetes    Healthy Eating Role of diet in the treatment of diabetes and the relationship between the three main macronutrients and blood glucose level;Plate Method;Meal timing in regards to the patients' current diabetes medication.;Information on hints to eating out and maintain blood glucose control.;Meal options for control of blood glucose level and chronic complications.    Being Active Role of exercise on diabetes management, blood pressure control and cardiac health.;Helped patient identify appropriate exercises in relation to his/her diabetes, diabetes complications and other health issue.    Medications Reviewed patients medication for diabetes, action, purpose,  timing of dose and side effects.    Monitoring Taught/evaluated CGM (comment)    Acute complications Taught prevention, symptoms, and  treatment of hypoglycemia - the 15 rule.    Chronic complications Relationship between chronic complications and blood glucose control;Identified and discussed with patient  current chronic complications    Diabetes Stress and Support Identified and addressed patients feelings and concerns about diabetes;Worked with patient to identify barriers to care and solutions;Role of stress on diabetes    Lifestyle and Health Coping Lifestyle issues that need to be addressed for better diabetes care      Individualized Goals (developed by patient)   Nutrition General guidelines for healthy choices and portions discussed    Physical Activity Exercise 1-2 times per week;45 minutes per day    Medications take my medication as prescribed  Monitoring  Test my blood glucose as discussed    Problem Solving Eating Pattern    Reducing Risk examine blood glucose patterns;do foot checks daily;treat hypoglycemia with 15 grams of carbs if blood glucose less than 70mg /dL    Health Coping Ask for help with psychological, social, or emotional issues      Post-Education Assessment   Patient understands the diabetes disease and treatment process. Comprehends key points    Patient understands incorporating nutritional management into lifestyle. Comprehends key points    Patient undertands incorporating physical activity into lifestyle. Comprehends key points    Patient understands using medications safely. Comphrehends key points    Patient understands monitoring blood glucose, interpreting and using results Comprehends key points    Patient understands prevention, detection, and treatment of acute complications. Comprehends key points    Patient understands prevention, detection, and treatment of chronic complications. Comprehends key points    Patient understands how to develop  strategies to address psychosocial issues. Comprehends key points    Patient understands how to develop strategies to promote health/change behavior. Comprehends key points      Outcomes   Expected Outcomes Demonstrated interest in learning. Expect positive outcomes    Future DMSE 2 months    Program Status Not Completed             Individualized Plan for Diabetes Self-Management Training:   Learning Objective:  Patient will have a greater understanding of diabetes self-management. Patient education plan is to attend individual and/or group sessions per assessed needs and concerns.   Plan:   Patient Instructions  Goals Established by Patient:  Goal 1: get [employer] membership again. Go 1-2 times per week.   Goal 2: drink 1-2 16 oz glasses of water.   Goal 3: get out of the house at least once per week.   Expected Outcomes:  Demonstrated interest in learning. Expect positive outcomes  Education material provided: ADA - How to Thrive: A Guide for Your Journey with Diabetes, My Plate, and Snack sheet  If problems or questions, patient to contact team via:  Phone  Future DSME appointment: 2 months

## 2023-04-17 NOTE — Progress Notes (Signed)
 Virtual Visit via Video Note  I connected with Mark [name] on 04/17/23 at 1-1:45pm EST by video-enabled telemedicine with the correct person using two identifiers.   I discussed the limitations of evaluation and management by telemedicine and the availability of in person appointments. The patient expressed understanding and agreed to proceed.  LOCATION Patient: home Provider: home office    History of Present Illness: Pt was referred to [facility] OP therapy for PTSD, depression and anxiety by the [facility].  Treatment Goal Addressed:   Pt will reduce irritability and increase normal social interaction with family and friends and will develop the ability to recognize, accept, and cope with feelings of depression and anxiety by using practiced coping skills for depression 1-2x daily AEB self report.  Progress towards Goal:  Progressing    Observations/Objective: Pt presented for today's session on time and was alert, oriented x5, with no evidence or self-report of SI/HI or A/V H.  Patient reported ongoing compliance with medication.  Clinician inquired about patient's current emotional ratings, as well as any significant changes in thoughts, feelings or behavior since previous session. Patient reported scores of 8/10 for depression, 7/10 for anxiety, 3/10 for anger/irritability, and denied any panic attacks.  Pt identified stressors that occurred in his life over the past week. Cln and pt reviewed each stressor and coping skills used over the past week: grounding and breathing exercises:  packing up house, [place] back to [place], family, moving back to toxic environment, giving away ESA,  financial issues, chronic pain,  pain in back due to blisters, amputee disability. Pt has an appt this afternoon with [facility]. Cln asked open ended questions about the goals of working with a nutritionist. Pt presented with low frustration tolerance. Cln provided psychoeducation on increasing frustration  tolerance.    Collaboration of Care: Other: Continue seeing providers at [place].    Patient/Guardian was advised Release of Information must be obtained prior to any record release in order to collaborate their care with an outside provider. Patient/Guardian was advised if they have not already done so to contact the registration department to sign all necessary forms in order for [name] to release information regarding their care.    Consent: Patient/Guardian gives verbal consent for treatment and assignment of benefits for services provided during this visit. Patient/Guardian expressed understanding and agreed to proceed.     Assessment and Plan: Counselor will continue to meet with patient and address treatment plan goals. Patient was given a chance for recommendations of providers and implement skills learned in session and encouraged to practice coping skills between sessions, practicing between sessions.Diagnosis: anxiety, depression and ptsd  Follow Up Instructions I discussed the assessment and treatment plan with the patient. The patient was provided an opportunity to ask questions and all were answered. The patient agreed with the plan and demonstrated an understanding of the instructions.   The patient was advised to call back or seek an in-person evaluation if the symptoms worsen or if the condition fails to improve as anticipated.  I provided 45 minutes of non-face-to-face time during this encounter.   Abuk Selleck S, LCAS  .

## 2023-04-17 NOTE — Patient Instructions (Addendum)
 Goals Established by Patient:  Goal 1: get [employer] membership again. Go 1-2 times per week.   Goal 2: drink 1-2 16 oz glasses of water.   Goal 3: get out of the house at least once per week.

## 2023-04-21 ENCOUNTER — Encounter (HOSPITAL_COMMUNITY): Payer: Self-pay | Admitting: Licensed Clinical Social Worker

## 2023-04-21 NOTE — Progress Notes (Signed)
 Virtual Visit via Video Note  I connected with Mark [name] on 04/10/23 at 1-2pm EST by video-enabled telemedicine with the correct person using two identifiers.   I discussed the limitations of evaluation and management by telemedicine and the availability of in person appointments. The patient expressed understanding and agreed to proceed.  LOCATION Patient: home Provider: home office    History of Present Illness: Pt was referred to [facility] OP therapy for PTSD, depression and anxiety by the [facility].  Treatment Goal Addressed:   Pt will reduce irritability and increase normal social interaction with family and friends and will develop the ability to recognize, accept, and cope with feelings of PTSD, depression and anxiety by using practiced coping skills f1-2x daily AEB self report.  Progress towards Goal:  Progressing    Observations/Objective: Pt presented for today's session on time and was alert, oriented x5, with no evidence or self-report of SI/HI or A/V H.  Patient reported ongoing compliance with medication and denied any use of alcohol or illicit substances.  Clinician inquired about patient's current emotional ratings, as well as any significant changes in thoughts, feelings or behavior since previous session. Patient reported scores of 8/10 for depression, 6/10 for anxiety, 3/10 for anger/irritability, and denied any panic attacks.  Pt identified stressors that occurred in his life over the past week. Cln and pt reviewed each stressor and coping skills used over the past week: grounding and breathing exercises. Stressors:  packing up house, moving back to [place], family, moving back to toxic environment, giving away ESA,  financial issues, chronic pain,  pain in back due to blisters, amputee disability. "My depression has gotten worse, more than my anxiety." Cln asked open ended questions about reasons for deepening depression. Cln provided psychoeducation on the cycle of depression.  "I don't feel helpless or hopeless, just more depressed." Cln reviewed safety plan with pt.   Collaboration of Care: Other: Continue seeing providers at [place].    Patient/Guardian was advised Release of Information must be obtained prior to any record release in order to collaborate their care with an outside provider. Patient/Guardian was advised if they have not already done so to contact the registration department to sign all necessary forms in order for [name] to release information regarding their care.    Consent: Patient/Guardian gives verbal consent for treatment and assignment of benefits for services provided during this visit. Patient/Guardian expressed understanding and agreed to proceed.     Assessment and Plan: Counselor will continue to meet with patient and address treatment plan goals. Patient was given a chance for recommendations of providers and implement skills learned in session and encouraged to practice coping skills between sessions, practicing between sessions.Diagnosis: anxiety, depression and ptsd  Follow Up Instructions I discussed the assessment and treatment plan with the patient. The patient was provided an opportunity to ask questions and all were answered. The patient agreed with the plan and demonstrated an understanding of the instructions.   The patient was advised to call back or seek an in-person evaluation if the symptoms worsen or if the condition fails to improve as anticipated.  I provided 60 minutes of non-face-to-face time during this encounter.   [name] S, LCAS  .

## 2023-04-22 ENCOUNTER — Encounter (HOSPITAL_COMMUNITY): Payer: Self-pay | Admitting: Licensed Clinical Social Worker

## 2023-04-22 ENCOUNTER — Ambulatory Visit (INDEPENDENT_AMBULATORY_CARE_PROVIDER_SITE_OTHER): Payer: No Typology Code available for payment source | Admitting: Licensed Clinical Social Worker

## 2023-04-22 DIAGNOSIS — F32A Depression, unspecified: Secondary | ICD-10-CM | POA: Diagnosis not present

## 2023-04-22 DIAGNOSIS — F4310 Post-traumatic stress disorder, unspecified: Secondary | ICD-10-CM | POA: Diagnosis not present

## 2023-04-22 DIAGNOSIS — F419 Anxiety disorder, unspecified: Secondary | ICD-10-CM

## 2023-04-22 DIAGNOSIS — F331 Major depressive disorder, recurrent, moderate: Secondary | ICD-10-CM

## 2023-04-22 NOTE — Progress Notes (Signed)
 Virtual Visit via Video Group Note  I connected with Mark [name] on 04/22/23 at 5:00-7:00pm EST by a video enabled telemedicine application and verified that I am speaking with the correct person using two identifiers.   I discussed the limitations of evaluation and management by telemedicine and the availability of in person appointments. The patient expressed understanding and agreed to proceed.  LOCATION: Patient: Home Provider: home Office  History of Present Illness: [facility] depression, [facility].  Treatment Goal Addressed:  Pt will develop the ability to recognize, accept, and cope with feelings of anxiety AEB self report.  Progress towards Goal:  Progressing    Observation/Objective:   Pt participated in a discussion on family dysfunction. Clinician assessed patient'[name] family dynamics and discussed how the dynamics influence mental health symptoms in a negative way. Patient was encouraged to work to improve family function.       2025 WORD: INTENTIONAL:    Assessment and Plan:  Counselor will continue to meet with patient to address treatment plan goals. Patient will continue to follow recommendations of providers and implement skills learned in session.    Follow-up instructions:I discussed the assessment and treatment plan with the patient. Patient was provided an opportunity to ask questions and all were answered. The patient agreed with the plan and demonstrated an understanding of the instructions.   The patient was advised to call back or seek an in-person evaluation if the symptoms worsen or if the condition fails to improve as anticipated.  Collaboration of Care: Other: Continue to see providers at [facility]  Patient/Guardian was advised Release of Information must be obtained prior to any record release in order to collaborate their care with an outside provider. Patient/Guardian was advised if they have not  already done so to contact the registration department to sign all necessary forms in order for [name] to release information regarding their care.   Consent: Patient/Guardian gives verbal consent for treatment and assignment of benefits for services provided during this visit. Patient/Guardian expressed understanding and agreed to proceed.      I provided 120 minutes of non-face-to-face time during this encounter.   Mark [name], LCAS              04/22/23

## 2023-04-24 ENCOUNTER — Ambulatory Visit (INDEPENDENT_AMBULATORY_CARE_PROVIDER_SITE_OTHER): Admitting: Licensed Clinical Social Worker

## 2023-04-24 ENCOUNTER — Encounter (HOSPITAL_COMMUNITY): Payer: Self-pay | Admitting: Licensed Clinical Social Worker

## 2023-04-24 DIAGNOSIS — F32A Depression, unspecified: Secondary | ICD-10-CM | POA: Diagnosis not present

## 2023-04-24 DIAGNOSIS — F4310 Post-traumatic stress disorder, unspecified: Secondary | ICD-10-CM | POA: Diagnosis not present

## 2023-04-24 DIAGNOSIS — F331 Major depressive disorder, recurrent, moderate: Secondary | ICD-10-CM

## 2023-04-24 DIAGNOSIS — F419 Anxiety disorder, unspecified: Secondary | ICD-10-CM

## 2023-04-24 NOTE — Progress Notes (Signed)
 Virtual Visit via Video Note  I connected with Mark [name] on 04/24/23 at 1-2pm EST by video-enabled telemedicine with the correct person using two identifiers.   I discussed the limitations of evaluation and management by telemedicine and the availability of in person appointments. The patient expressed understanding and agreed to proceed.  LOCATION Patient: home Provider: home office    History of Present Illness: Pt was referred to [facility] OP therapy for PTSD, depression and anxiety by the [facility]  Treatment Goal Addressed:   Pt will reduce irritability and increase normal social interaction with family and friends and will develop the ability to recognize, accept, and cope with feelings of depression and anxiety by using practiced coping skills for depression 1-2x daily AEB self report.  Progress towards Goal:  Progressing    Observations/Objective: Pt presented for today's session on time and alert, oriented x5, with no evidence or self-report of SI/HI or A/V H.  Patient reported ongoing compliance with medication.  Clinician inquired about patient's current emotional ratings, as well as any significant changes in thoughts, feelings or behavior since previous session. Patient reported scores of 8/10 for depression, 7/10 for anxiety, 3/10 for anger/irritability, and denied any panic attacks.  Pt identified stressors that occurred in his life over the past week. Cln and pt reviewed each stressor and coping skills used over the past week: grounding and breathing exercises:  packing up house, [place] back to [place], family, moving back to toxic environment, giving away ESA,  financial issues, chronic pain,  pain in back due to blisters, amputee disability. Pt met with [facility] discussing his diabetes and using juicing as a health benefit. Cln asked open ended questions about the goals of working with a nutritionist. Clinician engaged in discussion on identified positives and utilized MI,  OARS to assess how these have impacted pt's mood. Clinician allowed space for further processing of emotions and provided supportive statements throughout session.    Collaboration of Care: Other: Continue seeing providers at [place].    Patient/Guardian was advised Release of Information must be obtained prior to any record release in order to collaborate their care with an outside provider. Patient/Guardian was advised if they have not already done so to contact the registration department to sign all necessary forms in order for [name] to release information regarding their care.    Consent: Patient/Guardian gives verbal consent for treatment and assignment of benefits for services provided during this visit. Patient/Guardian expressed understanding and agreed to proceed.     Assessment and Plan: Counselor will continue to meet with patient and address treatment plan goals. Patient was given a chance for recommendations of providers and implement skills learned in session and encouraged to practice coping skills between sessions, practicing between sessions.Diagnosis: anxiety, depression and ptsd  Follow Up Instructions I discussed the assessment and treatment plan with the patient. The patient was provided an opportunity to ask questions and all were answered. The patient agreed with the plan and demonstrated an understanding of the instructions.   The patient was advised to call back or seek an in-person evaluation if the symptoms worsen or if the condition fails to improve as anticipated.  I provided 60 minutes of non-face-to-face time during this encounter.   Versie Fleener S, LCAS  .

## 2023-04-29 ENCOUNTER — Ambulatory Visit (INDEPENDENT_AMBULATORY_CARE_PROVIDER_SITE_OTHER): Payer: No Typology Code available for payment source | Admitting: Licensed Clinical Social Worker

## 2023-04-29 ENCOUNTER — Encounter (HOSPITAL_COMMUNITY): Payer: Self-pay | Admitting: Licensed Clinical Social Worker

## 2023-04-29 DIAGNOSIS — F4310 Post-traumatic stress disorder, unspecified: Secondary | ICD-10-CM

## 2023-04-29 DIAGNOSIS — F32A Depression, unspecified: Secondary | ICD-10-CM

## 2023-04-29 DIAGNOSIS — F419 Anxiety disorder, unspecified: Secondary | ICD-10-CM | POA: Diagnosis not present

## 2023-04-29 DIAGNOSIS — F331 Major depressive disorder, recurrent, moderate: Secondary | ICD-10-CM

## 2023-04-29 NOTE — Progress Notes (Signed)
 Virtual Visit via Video Group Note  I connected with [name] on 04/29/23 at 5:00-7:00pm EST by a video enabled telemedicine application and verified that I am speaking with the correct person using two identifiers.   I discussed the limitations of evaluation and management by telemedicine and the availability of in person appointments. The patient expressed understanding and agreed to proceed.  LOCATION: Patient: Home Provider: home Office  History of Present Illness: [facility], [facility].  Treatment Goal Addressed:  Pt will develop the ability to recognize, accept, and cope with feelings of anxiety AEB self report.  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 Pt discussed reasons to join the [employer] (family pressure, no place to live, family military history, not going to college, no employment available. Pt was encouraged to feel the emotions experienced from the memories of serving in the [employer].        2025 WORD: INTENTIONAL:    Assessment and Plan:  Counselor will continue to meet with patient to address treatment plan goals. Patient will continue to follow recommendations of providers and implement skills learned in session.    Follow-up instructions:I discussed the assessment and treatment plan with the patient. Patient was provided an opportunity to ask questions and all were answered. The patient agreed with the plan and demonstrated an understanding of the instructions.   The patient was advised to call back or seek an in-person evaluation if the symptoms worsen or if the condition fails to improve as anticipated.  Collaboration of Care: Other: Continue to see providers at [facility]  Patient/Guardian was advised Release of Information must be obtained  prior to any record release in order to collaborate their care with an outside provider. Patient/Guardian was advised if they have not already done so to contact the registration department to sign all necessary forms in order for [name] to release information regarding their care.   Consent: Patient/Guardian gives verbal consent for treatment and assignment of benefits for services provided during this visit. Patient/Guardian expressed understanding and agreed to proceed.      I provided 120 minutes of non-face-to-face time during this encounter.   Nohely Whitehorn S, LCAS              04/29/23

## 2023-05-01 ENCOUNTER — Ambulatory Visit (INDEPENDENT_AMBULATORY_CARE_PROVIDER_SITE_OTHER): Admitting: Licensed Clinical Social Worker

## 2023-05-01 DIAGNOSIS — F4310 Post-traumatic stress disorder, unspecified: Secondary | ICD-10-CM | POA: Diagnosis not present

## 2023-05-01 DIAGNOSIS — F32A Depression, unspecified: Secondary | ICD-10-CM

## 2023-05-01 DIAGNOSIS — F419 Anxiety disorder, unspecified: Secondary | ICD-10-CM | POA: Diagnosis not present

## 2023-05-01 DIAGNOSIS — F331 Major depressive disorder, recurrent, moderate: Secondary | ICD-10-CM

## 2023-05-06 ENCOUNTER — Encounter (HOSPITAL_COMMUNITY): Payer: Self-pay | Admitting: Licensed Clinical Social Worker

## 2023-05-06 ENCOUNTER — Ambulatory Visit (INDEPENDENT_AMBULATORY_CARE_PROVIDER_SITE_OTHER): Payer: No Typology Code available for payment source | Admitting: Licensed Clinical Social Worker

## 2023-05-06 DIAGNOSIS — F331 Major depressive disorder, recurrent, moderate: Secondary | ICD-10-CM

## 2023-05-06 DIAGNOSIS — F4310 Post-traumatic stress disorder, unspecified: Secondary | ICD-10-CM

## 2023-05-06 DIAGNOSIS — F419 Anxiety disorder, unspecified: Secondary | ICD-10-CM

## 2023-05-06 DIAGNOSIS — F32A Depression, unspecified: Secondary | ICD-10-CM

## 2023-05-06 NOTE — Progress Notes (Signed)
 Virtual Visit via Video Note  I connected with Mark [name] on 05/01/23 at 1-2pm EST by video-enabled telemedicine with the correct person using two identifiers.   I discussed the limitations of evaluation and management by telemedicine and the availability of in person appointments. The patient expressed understanding and agreed to proceed.  LOCATION Patient: home Provider: home office    History of Present Illness: Pt was referred to [facility] OP therapy for PTSD, depression and anxiety by the [facility]  Treatment Goal Addressed:   Pt will reduce irritability and increase normal social interaction with family and friends and will develop the ability to recognize, accept, and cope with feelings of depression and anxiety by using practiced coping skills for depression 1-2x daily AEB self report.  Progress towards Goal:  Progressing    Observations/Objective: Pt presented for today's session on time and alert, oriented x5, with no evidence or self-report of SI/HI or A/V H.  Patient reported ongoing compliance with medication.  Clinician inquired about patient's current emotional ratings, as well as any significant changes in thoughts, feelings or behavior since previous session. Patient reported scores of 8/10 for depression, 7/10 for anxiety, 3/10 for anger/irritability, and denied any panic attacks.  Pt identified stressors that occurred in his life over the past week. Cln and pt reviewed each stressor and coping skills used over the past week: grounding and breathing exercises:  packing up house, moving back to [place], family, moving back to toxic environment, giving away ESA,  financial issues, chronic pain,  pain in back due to blisters, amputee disability. Pt continues with juicing as a health benefit. Again, cln asked open ended questions about the goal of working with a nutritionist. Clinician utilized CBT to address thought processes support and confidence in his decisions.         Collaboration of Care: Other: Continue seeing providers at [place].    Patient/Guardian was advised Release of Information must be obtained prior to any record release in order to collaborate their care with an outside provider. Patient/Guardian was advised if they have not already done so to contact the registration department to sign all necessary forms in order for us  to release information regarding their care.    Consent: Patient/Guardian gives verbal consent for treatment and assignment of benefits for services provided during this visit. Patient/Guardian expressed understanding and agreed to proceed.     Assessment and Plan: Counselor will continue to meet with patient and address treatment plan goals. Patient was given a chance for recommendations of providers and implement skills learned in session and encouraged to practice coping skills between sessions, practicing between sessions.Diagnosis: anxiety, depression and ptsd  Follow Up Instructions I discussed the assessment and treatment plan with the patient. The patient was provided an opportunity to ask questions and all were answered. The patient agreed with the plan and demonstrated an understanding of the instructions.   The patient was advised to call back or seek an in-person evaluation if the symptoms worsen or if the condition fails to improve as anticipated.  I provided 60 minutes of non-face-to-face time during this encounter.   Edna Rede S, LCAS  .

## 2023-05-06 NOTE — Progress Notes (Signed)
 Virtual Visit via Video Group Note  I connected with Mark [name] on 05/06/23 at 5:00-7:00pm EST by a video enabled telemedicine application and verified that I am speaking with the correct person using two identifiers.   I discussed the limitations of evaluation and management by telemedicine and the availability of in person appointments. The patient expressed understanding and agreed to proceed.  LOCATION: Patient: Home Provider: home Office  History of Present Illness: [facility], [facility], [facility].  Treatment Goal Addressed:  Pt will develop the ability to recognize, accept, and cope with feelings of anxiety AEB self report.  Progress towards Goal:  Progressing    Observation/Objective:  Patient participated in a discussion on the benefit of being vulnerable while sharing in group: Ask for what you need, be willing to expose your feelings, say what you want, express your emotions and be present. Patient was encouraged to show vulnerability while sharing in group.         2025 WORD: INTENTIONAL:    Assessment and Plan:  Counselor will continue to meet with patient to address treatment plan goals. Patient will continue to follow recommendations of providers and implement skills learned in session.    Follow-up instructions:I discussed the assessment and treatment plan with the patient. Patient was provided an opportunity to ask questions and all were answered. The patient agreed with the plan and demonstrated an understanding of the instructions.   The patient was advised to call back or seek an in-person evaluation if the symptoms worsen or if the condition fails to improve as anticipated.  Collaboration of Care: Other: Continue to see providers at [facility]  Patient/Guardian was advised Release of Information must be obtained prior to any record release in order to collaborate their care with an outside provider.  Patient/Guardian was advised if they have not already done so to contact the registration department to sign all necessary forms in order for us  to release information regarding their care.   Consent: Patient/Guardian gives verbal consent for treatment and assignment of benefits for services provided during this visit. Patient/Guardian expressed understanding and agreed to proceed.      I provided 120 minutes of non-face-to-face time during this encounter.   Darneisha Windhorst S, LCAS              05/06/23

## 2023-05-08 ENCOUNTER — Ambulatory Visit (INDEPENDENT_AMBULATORY_CARE_PROVIDER_SITE_OTHER): Admitting: Licensed Clinical Social Worker

## 2023-05-08 DIAGNOSIS — F4310 Post-traumatic stress disorder, unspecified: Secondary | ICD-10-CM | POA: Diagnosis not present

## 2023-05-08 DIAGNOSIS — F32A Depression, unspecified: Secondary | ICD-10-CM | POA: Diagnosis not present

## 2023-05-08 DIAGNOSIS — F419 Anxiety disorder, unspecified: Secondary | ICD-10-CM

## 2023-05-08 DIAGNOSIS — F331 Major depressive disorder, recurrent, moderate: Secondary | ICD-10-CM

## 2023-05-13 ENCOUNTER — Ambulatory Visit (INDEPENDENT_AMBULATORY_CARE_PROVIDER_SITE_OTHER): Payer: No Typology Code available for payment source | Admitting: Licensed Clinical Social Worker

## 2023-05-13 ENCOUNTER — Encounter (HOSPITAL_COMMUNITY): Payer: Self-pay | Admitting: Licensed Clinical Social Worker

## 2023-05-13 DIAGNOSIS — F4310 Post-traumatic stress disorder, unspecified: Secondary | ICD-10-CM

## 2023-05-13 DIAGNOSIS — F331 Major depressive disorder, recurrent, moderate: Secondary | ICD-10-CM

## 2023-05-13 DIAGNOSIS — F419 Anxiety disorder, unspecified: Secondary | ICD-10-CM | POA: Diagnosis not present

## 2023-05-13 DIAGNOSIS — F32A Depression, unspecified: Secondary | ICD-10-CM

## 2023-05-13 NOTE — Progress Notes (Signed)
 Virtual Visit via Video Note  I connected with Mark [name] on 05/08/23 at 1-2pm EST by video-enabled telemedicine with the correct person using two identifiers.   I discussed the limitations of evaluation and management by telemedicine and the availability of in person appointments. The patient expressed understanding and agreed to proceed.  LOCATION Patient: home Provider: home office    History of Present Illness: Pt was referred to [facility] OP therapy for PTSD, depression and anxiety by the [facility].  Treatment Goal Addressed:   Pt will reduce irritability and increase normal social interaction with family and friends and will develop the ability to recognize, accept, and cope with feelings of depression and anxiety by using practiced coping skills for depression 1-2x daily AEB self report.  Progress towards Goal:  Progressing    Observations/Objective: Pt presented for today's session on time and alert, oriented x5, with no evidence or self-report of SI/HI or A/V H.  Patient reported ongoing compliance with medication.  Clinician inquired about patient's current emotional ratings, as well as any significant changes in thoughts, feelings or behavior since previous session. Patient reported scores of 8/10 for depression, 7/10 for anxiety, 3/10 for anger/irritability, and denied any panic attacks.  Pt identified stressors that occurred in his life over the past week. Cln and pt reviewed each stressor and coping skills used over the past week: grounding and breathing exercises:  packing up house, [place] back to [place], family, moving back to toxic environment, giving away ESA,  financial issues, chronic pain,  pain in back due to blisters, amputee disability. Clinician utilized CBT to address thought processes support and confidence in his decisions.        Collaboration of Care: Other: Continue seeing providers at [place].    Patient/Guardian was advised Release of Information must be obtained  prior to any record release in order to collaborate their care with an outside provider. Patient/Guardian was advised if they have not already done so to contact the registration department to sign all necessary forms in order for us  to release information regarding their care.    Consent: Patient/Guardian gives verbal consent for treatment and assignment of benefits for services provided during this visit. Patient/Guardian expressed understanding and agreed to proceed.     Assessment and Plan: Counselor will continue to meet with patient and address treatment plan goals. Patient was given a chance for recommendations of providers and implement skills learned in session and encouraged to practice coping skills between sessions, practicing between sessions.Diagnosis: anxiety, depression and ptsd  Follow Up Instructions I discussed the assessment and treatment plan with the patient. The patient was provided an opportunity to ask questions and all were answered. The patient agreed with the plan and demonstrated an understanding of the instructions.   The patient was advised to call back or seek an in-person evaluation if the symptoms worsen or if the condition fails to improve as anticipated.  I provided 60 minutes of non-face-to-face time during this encounter.   Nethra Mehlberg S, LCAS  .

## 2023-05-13 NOTE — Progress Notes (Signed)
 Virtual Visit via Video Group Note  I connected with Mark [name] on 05/13/23 at 5:00-7:00pm EST by a video enabled telemedicine application and verified that I am speaking with the correct person using two identifiers.   I discussed the limitations of evaluation and management by telemedicine and the availability of in person appointments. The patient expressed understanding and agreed to proceed.  LOCATION: Patient: Home Provider: home Office  History of Present Illness: [facility], [facility], [facility].  Treatment Goal Addressed:  Pt will develop the ability to recognize, accept, and cope with feelings of anxiety AEB self report.  Progress towards Goal:  Progressing    Observation/Objective: Patient participated in a group discussion on "tolerating the holidays - Easter" with family members. Patient described his holiday plans and coping skills to deal with feelings surrounding the holidays. Patient was encouraged to express his feelings in positive ways.       2025 WORD: INTENTIONAL:    Assessment and Plan:  Counselor will continue to meet with patient to address treatment plan goals. Patient will continue to follow recommendations of providers and implement skills learned in session.    Follow-up instructions:I discussed the assessment and treatment plan with the patient. Patient was provided an opportunity to ask questions and all were answered. The patient agreed with the plan and demonstrated an understanding of the instructions.   The patient was advised to call back or seek an in-person evaluation if the symptoms worsen or if the condition fails to improve as anticipated.  Collaboration of Care: Other: Continue to see providers at [facility]  Patient/Guardian was advised Release of Information must be obtained prior to any record release in order to collaborate their care with an outside provider. Patient/Guardian was advised  if they have not already done so to contact the registration department to sign all necessary forms in order for us  to release information regarding their care.   Consent: Patient/Guardian gives verbal consent for treatment and assignment of benefits for services provided during this visit. Patient/Guardian expressed understanding and agreed to proceed.      I provided 120 minutes of non-face-to-face time during this encounter.   Ilanna Deihl S, LCAS              05/13/23

## 2023-05-15 ENCOUNTER — Ambulatory Visit (HOSPITAL_COMMUNITY): Admitting: Licensed Clinical Social Worker

## 2023-05-15 DIAGNOSIS — F419 Anxiety disorder, unspecified: Secondary | ICD-10-CM

## 2023-05-15 DIAGNOSIS — F4310 Post-traumatic stress disorder, unspecified: Secondary | ICD-10-CM

## 2023-05-15 DIAGNOSIS — F331 Major depressive disorder, recurrent, moderate: Secondary | ICD-10-CM

## 2023-05-20 ENCOUNTER — Encounter (HOSPITAL_COMMUNITY): Payer: Self-pay | Admitting: Licensed Clinical Social Worker

## 2023-05-20 ENCOUNTER — Ambulatory Visit (HOSPITAL_COMMUNITY): Payer: No Typology Code available for payment source | Admitting: Licensed Clinical Social Worker

## 2023-05-20 DIAGNOSIS — F419 Anxiety disorder, unspecified: Secondary | ICD-10-CM | POA: Diagnosis not present

## 2023-05-20 DIAGNOSIS — F331 Major depressive disorder, recurrent, moderate: Secondary | ICD-10-CM

## 2023-05-20 DIAGNOSIS — F4310 Post-traumatic stress disorder, unspecified: Secondary | ICD-10-CM

## 2023-05-20 NOTE — Progress Notes (Signed)
 Virtual Visit via Video Group Note  I connected with Mark [name] on 05/20/23 at 5:00-7:00pm EST by a video enabled telemedicine application and verified that I am speaking with the correct person using two identifiers.   I discussed the limitations of evaluation and management by telemedicine and the availability of in person appointments. The patient expressed understanding and agreed to proceed.  LOCATION: Patient: Home Provider: home Office  History of Present Illness: [facility], [facility], [facility].  Treatment Goal Addressed:  Pt will develop the ability to recognize, accept, and cope with feelings of anxiety AEB self report.  Progress towards Goal:  Progressing    Observation/Objective:   Patient participated in a discussion on dealing with health issues, including chronic pain. Cln used CBT to assist patient in improving the quality of life, activities of daily living, focusing on reducing stress by modifying physical sensation, catastrophic thinking and maladaptive behaviors. Patient was encouraged to improve quality of life.         2025 WORD: INTENTIONAL:    Assessment and Plan:  Counselor will continue to meet with patient to address treatment plan goals. Patient will continue to follow recommendations of providers and implement skills learned in session.    Follow-up instructions:I discussed the assessment and treatment plan with the patient. Patient was provided an opportunity to ask questions and all were answered. The patient agreed with the plan and demonstrated an understanding of the instructions.   The patient was advised to call back or seek an in-person evaluation if the symptoms worsen or if the condition fails to improve as anticipated.  Collaboration of Care: Other: Continue to see providers at [facility]  Patient/Guardian was advised Release of Information must be obtained prior to any record release in  order to collaborate their care with an outside provider. Patient/Guardian was advised if they have not already done so to contact the registration department to sign all necessary forms in order for us  to release information regarding their care.   Consent: Patient/Guardian gives verbal consent for treatment and assignment of benefits for services provided during this visit. Patient/Guardian expressed understanding and agreed to proceed.      I provided 120 minutes of non-face-to-face time during this encounter.   Jahlon Baines S, LCAS              05/20/23

## 2023-05-20 NOTE — Progress Notes (Signed)
 Virtual Visit via Video Note  I connected with Mark [name] on 05/15/23 at 1-1:30pm EST by video-enabled telemedicine with the correct person using two identifiers.   I discussed the limitations of evaluation and management by telemedicine and the availability of in person appointments. The patient expressed understanding and agreed to proceed.  LOCATION Patient: hospital Provider: home office    History of Present Illness: Pt was referred to [facility] OP therapy for PTSD, depression and anxiety by the [facility].  Treatment Goal Addressed:   Pt will reduce irritability and increase normal social interaction with family and friends and will develop the ability to recognize, accept, and cope with feelings of depression and anxiety by using practiced coping skills for depression 1-2x daily AEB self report.  Progress towards Goal:  Progressing    Observations/Objective: Pt presented for today's session in the hospital for prep time for colonoscopy at [facility], on time and alert, oriented x5, with no evidence or self-report of SI/HI or A/V H.  Patient reported ongoing compliance with medication.  Clinician inquired about patient's current emotional ratings, as well as any significant changes in thoughts, feelings or behavior since previous session. Patient reported scores of 8/10 for depression, 7/10 for anxiety, 3/10 for anger/irritability, and denied any panic attacks.  Pt identified stressors that occurred in his life over the past week. Cln and pt reviewed each stressor and coping skills used over the past week: grounding and breathing exercises:  packing up house, moving back to [place], family, moving back to toxic environment, giving away ESA,  financial issues, chronic pain,  pain in back due to blisters, amputee disability. Cln asked open ended questions about the upcoming procedure, his concerns and benefits of health prevention.     Collaboration of Care: Other: Continue seeing providers  at [place].    Patient/Guardian was advised Release of Information must be obtained prior to any record release in order to collaborate their care with an outside provider. Patient/Guardian was advised if they have not already done so to contact the registration department to sign all necessary forms in order for us  to release information regarding their care.    Consent: Patient/Guardian gives verbal consent for treatment and assignment of benefits for services provided during this visit. Patient/Guardian expressed understanding and agreed to proceed.     Assessment and Plan: Counselor will continue to meet with patient and address treatment plan goals. Patient was given a chance for recommendations of providers and implement skills learned in session and encouraged to practice coping skills between sessions, practicing between sessions.Diagnosis: anxiety, depression and ptsd  Follow Up Instructions I discussed the assessment and treatment plan with the patient. The patient was provided an opportunity to ask questions and all were answered. The patient agreed with the plan and demonstrated an understanding of the instructions.   The patient was advised to call back or seek an in-person evaluation if the symptoms worsen or if the condition fails to improve as anticipated.  I provided 30 minutes of non-face-to-face time during this encounter.   Mark [name] S, LCAS  .

## 2023-05-22 ENCOUNTER — Encounter (HOSPITAL_COMMUNITY): Payer: Self-pay | Admitting: Licensed Clinical Social Worker

## 2023-05-22 ENCOUNTER — Encounter (HOSPITAL_COMMUNITY): Payer: Self-pay | Admitting: Physician Assistant

## 2023-05-22 ENCOUNTER — Ambulatory Visit (HOSPITAL_COMMUNITY): Admitting: Licensed Clinical Social Worker

## 2023-05-22 DIAGNOSIS — F4310 Post-traumatic stress disorder, unspecified: Secondary | ICD-10-CM

## 2023-05-22 DIAGNOSIS — F331 Major depressive disorder, recurrent, moderate: Secondary | ICD-10-CM | POA: Diagnosis not present

## 2023-05-22 DIAGNOSIS — F411 Generalized anxiety disorder: Secondary | ICD-10-CM

## 2023-05-22 NOTE — Progress Notes (Signed)
 Virtual Visit via Video Note  I connected with Mark [name] on 05/22/23 at 1:30-2pm EST by video-enabled telemedicine with the correct person using two identifiers.   I discussed the limitations of evaluation and management by telemedicine and the availability of in person appointments. The patient expressed understanding and agreed to proceed.  LOCATION Patient: hospital Provider: home office    History of Present Illness: Pt was referred to [facility] OP therapy for PTSD, depression and anxiety by the [facility].  Treatment Goal Addressed:   Pt will reduce irritability and increase normal social interaction with family and friends and will develop the ability to recognize, accept, and cope with feelings of depression and anxiety by using practiced coping skills for depression 1-2x daily AEB self report.  Progress towards Goal:  Progressing    Observations/Objective: Pt presented for today's session late due to a work meeting, oriented x5, with no evidence or self-report of SI/HI or A/V H.  Patient reported ongoing compliance with medication. Clinician inquired about patient's current emotional ratings, as well as any significant changes in thoughts, feelings or behavior since previous session. Patient reported scores of 8/10 for depression, 8/10 for anxiety, 5/10 for anger/irritability, and denied any panic attacks.  Pt identified stressors that occurred in his life over the past week. Cln and pt reviewed each stressor and coping skills used over the past week: grounding and breathing exercises:  packing up house, moving back to [place], family, moving back to toxic environment, giving away ESA,  financial issues, chronic pain,  pain in back due to blisters, amputee disability. Pt reports, "my daughters graduation in [place], next week. Cln asked open ended questions. The majority of skill practice was focused on identifying specific problem (and priority/goal) and brainstorming  options/alternatives.           Collaboration of Care: Other: Continue seeing providers at [place].    Patient/Guardian was advised Release of Information must be obtained prior to any record release in order to collaborate their care with an outside provider. Patient/Guardian was advised if they have not already done so to contact the registration department to sign all necessary forms in order for us  to release information regarding their care.    Consent: Patient/Guardian gives verbal consent for treatment and assignment of benefits for services provided during this visit. Patient/Guardian expressed understanding and agreed to proceed.     Assessment and Plan: Counselor will continue to meet with patient and address treatment plan goals. Patient was given a chance for recommendations of providers and implement skills learned in session and encouraged to practice coping skills between sessions, practicing between sessions.Diagnosis: anxiety, depression and ptsd  Follow Up Instructions I discussed the assessment and treatment plan with the patient. The patient was provided an opportunity to ask questions and all were answered. The patient agreed with the plan and demonstrated an understanding of the instructions.   The patient was advised to call back or seek an in-person evaluation if the symptoms worsen or if the condition fails to improve as anticipated.  I provided 30 minutes of non-face-to-face time during this encounter.   Kymoni Lesperance S, LCAS  .

## 2023-05-23 ENCOUNTER — Other Ambulatory Visit (HOSPITAL_COMMUNITY): Payer: Self-pay | Admitting: Physician Assistant

## 2023-05-23 DIAGNOSIS — Z87891 Personal history of nicotine dependence: Secondary | ICD-10-CM

## 2023-05-27 ENCOUNTER — Ambulatory Visit (HOSPITAL_COMMUNITY): Payer: No Typology Code available for payment source | Admitting: Licensed Clinical Social Worker

## 2023-05-27 ENCOUNTER — Encounter (HOSPITAL_COMMUNITY): Payer: Self-pay | Admitting: Licensed Clinical Social Worker

## 2023-05-27 DIAGNOSIS — F4310 Post-traumatic stress disorder, unspecified: Secondary | ICD-10-CM | POA: Diagnosis not present

## 2023-05-27 DIAGNOSIS — F331 Major depressive disorder, recurrent, moderate: Secondary | ICD-10-CM | POA: Diagnosis not present

## 2023-05-27 DIAGNOSIS — F411 Generalized anxiety disorder: Secondary | ICD-10-CM | POA: Diagnosis not present

## 2023-05-27 NOTE — Progress Notes (Signed)
 Virtual Visit via Video Group Note  I connected with Mark [name] on 05/27/23 at 5:00-7:00pm EST by a video enabled telemedicine application and verified that I am speaking with the correct person using two identifiers.   I discussed the limitations of evaluation and management by telemedicine and the availability of in person appointments. The patient expressed understanding and agreed to proceed.  LOCATION: Patient: Home Provider: home Office  History of Present Illness: [facility], [facility], [facility].  Treatment Goal Addressed:  Pt will develop the ability to recognize, accept, and cope with feelings of anxiety AEB self report.  Progress towards Goal:  Progressing    Observation/Objective:   Patient participated in a group discussion on overcoming life's challenges, under stress, which makes it more difficult to overcome obstacles in life. Patient was encouraged to identify life challenges and explored strategies for overcoming obstacles.        2025 WORD: INTENTIONAL:    Assessment and Plan:  Counselor will continue to meet with patient to address treatment plan goals. Patient will continue to follow recommendations of providers and implement skills learned in session.    Follow-up instructions:I discussed the assessment and treatment plan with the patient. Patient was provided an opportunity to ask questions and all were answered. The patient agreed with the plan and demonstrated an understanding of the instructions.   The patient was advised to call back or seek an in-person evaluation if the symptoms worsen or if the condition fails to improve as anticipated.  Collaboration of Care: Other: Continue to see providers at [facility]  Patient/Guardian was advised Release of Information must be obtained prior to any record release in order to collaborate their care with an outside provider. Patient/Guardian was advised if they have  not already done so to contact the registration department to sign all necessary forms in order for us  to release information regarding their care.   Consent: Patient/Guardian gives verbal consent for treatment and assignment of benefits for services provided during this visit. Patient/Guardian expressed understanding and agreed to proceed.      I provided 120 minutes of non-face-to-face time during this encounter.   Lenola Lockner S, LCAS              05/27/23

## 2023-05-29 ENCOUNTER — Ambulatory Visit (HOSPITAL_COMMUNITY): Admitting: Licensed Clinical Social Worker

## 2023-05-29 DIAGNOSIS — F4310 Post-traumatic stress disorder, unspecified: Secondary | ICD-10-CM

## 2023-05-29 DIAGNOSIS — F411 Generalized anxiety disorder: Secondary | ICD-10-CM | POA: Diagnosis not present

## 2023-05-29 DIAGNOSIS — F331 Major depressive disorder, recurrent, moderate: Secondary | ICD-10-CM | POA: Diagnosis not present

## 2023-05-30 ENCOUNTER — Encounter (HOSPITAL_COMMUNITY): Payer: Self-pay | Admitting: Licensed Clinical Social Worker

## 2023-05-30 NOTE — Progress Notes (Signed)
 Virtual Visit via Video Note  I connected with Mark [name] on 05/28/23 at 1:00-2pm EST by video-enabled telemedicine with the correct person using two identifiers.   I discussed the limitations of evaluation and management by telemedicine and the availability of in person appointments. The patient expressed understanding and agreed to proceed.  LOCATION Patient: home Provider: home office    History of Present Illness: Pt was referred to [facility] OP therapy for PTSD, depression and anxiety by the [facility].  Treatment Goal Addressed:   Pt will reduce irritability and increase normal social interaction with family and friends and will develop the ability to recognize, accept, and cope with feelings of depression and anxiety by using practiced coping skills for depression 1-2x daily AEB self report.  Progress towards Goal:  Progressing    Observations/Objective: Pt presented for today's session late due to a work meeting, oriented x5, with no evidence or self-report of SI/HI or A/V H.  Patient reported ongoing compliance with medication. Clinician inquired about patient's current emotional ratings, as well as any significant changes in thoughts, feelings or behavior since previous session. Patient reported scores of 8/10 for depression, 8/10 for anxiety, 5/10 for anger/irritability, and denied any panic attacks.  Pt identified stressors that occurred in his life over the past week. Cln and pt reviewed each stressor and coping skills used over the past week: grounding and breathing exercises:  packing up house, moving back to [place], family, moving back to toxic environment, giving away ESA,  financial issues, chronic pain,  pain in back due to blisters, amputee disability. Pt reports, "my daughters graduation in [place] tomorrow. Cln asked open ended questions about concerns of family environment during the celebration. Cln and pt role played possible  producing scenarios and possible coping skills.  Cln used CBT to assist patient with managing his concerns about the family weekend. Cln explored with patient his thoughts, beliefs, feelings, and behaviors, counteracting his thought patterns. Patient was encouraged to counteract thought patterns.           Collaboration of Care: Other: Continue seeing providers at [place].    Patient/Guardian was advised Release of Information must be obtained prior to any record release in order to collaborate their care with an outside provider. Patient/Guardian was advised if they have not already done so to contact the registration department to sign all necessary forms in order for us  to release information regarding their care.    Consent: Patient/Guardian gives verbal consent for treatment and assignment of benefits for services provided during this visit. Patient/Guardian expressed understanding and agreed to proceed.     Assessment and Plan: Counselor will continue to meet with patient and address treatment plan goals. Patient was given a chance for recommendations of providers and implement skills learned in session and encouraged to practice coping skills between sessions, practicing between sessions.Diagnosis: anxiety, depression and ptsd  Follow Up Instructions I discussed the assessment and treatment plan with the patient. The patient was provided an opportunity to ask questions and all were answered. The patient agreed with the plan and demonstrated an understanding of the instructions.   The patient was advised to call back or seek an in-person evaluation if the symptoms worsen or if the condition fails to improve as anticipated.  I provided 60 minutes of non-face-to-face time during this encounter.   Mark [name] S, LCAS  .

## 2023-06-03 ENCOUNTER — Encounter (HOSPITAL_COMMUNITY): Payer: Self-pay | Admitting: Licensed Clinical Social Worker

## 2023-06-03 ENCOUNTER — Encounter (HOSPITAL_COMMUNITY): Payer: Self-pay

## 2023-06-03 ENCOUNTER — Ambulatory Visit (HOSPITAL_COMMUNITY): Payer: No Typology Code available for payment source | Admitting: Licensed Clinical Social Worker

## 2023-06-03 ENCOUNTER — Ambulatory Visit (HOSPITAL_COMMUNITY)

## 2023-06-03 DIAGNOSIS — F4310 Post-traumatic stress disorder, unspecified: Secondary | ICD-10-CM | POA: Diagnosis not present

## 2023-06-03 DIAGNOSIS — F411 Generalized anxiety disorder: Secondary | ICD-10-CM

## 2023-06-03 DIAGNOSIS — F331 Major depressive disorder, recurrent, moderate: Secondary | ICD-10-CM

## 2023-06-03 NOTE — Progress Notes (Signed)
 Virtual Visit via Video Group Note  I connected with Mark [name] on 06/03/23 at 5:00-7:00pm EST by a video enabled telemedicine application and verified that I am speaking with the correct person using two identifiers.   I discussed the limitations of evaluation and management by telemedicine and the availability of in person appointments. The patient expressed understanding and agreed to proceed.  LOCATION: Patient: Home Provider: home Office  History of Present Illness: [facility], [facility], [facility].  Treatment Goal Addressed:  Pt will develop the ability to recognize, accept, and cope with feelings of anxiety AEB self report.  Progress towards Goal:  Progressing    Observation/Objective:  Pt participated in a discussion on Radical acceptance, which is defined as accepting something fully, both mentally and emotionally. It does not require liking or approving of something, just accepting the facts of the situation. Pt was encouraged to use Radical Acceptance to improve pt's mental health.       2025 WORD: INTENTIONAL:    Assessment and Plan:  Counselor will continue to meet with patient to address treatment plan goals. Patient will continue to follow recommendations of providers and implement skills learned in session.    Follow-up instructions:I discussed the assessment and treatment plan with the patient. Patient was provided an opportunity to ask questions and all were answered. The patient agreed with the plan and demonstrated an understanding of the instructions.   The patient was advised to call back or seek an in-person evaluation if the symptoms worsen or if the condition fails to improve as anticipated.  Collaboration of Care: Other: Continue to see providers at [facility]  Patient/Guardian was advised Release of Information must be obtained prior to any record release in order to collaborate their care with an outside  provider. Patient/Guardian was advised if they have not already done so to contact the registration department to sign all necessary forms in order for us  to release information regarding their care.   Consent: Patient/Guardian gives verbal consent for treatment and assignment of benefits for services provided during this visit. Patient/Guardian expressed understanding and agreed to proceed.      I provided 120 minutes of non-face-to-face time during this encounter.   Christy Friede S, LCAS              06/03/23

## 2023-06-05 ENCOUNTER — Ambulatory Visit (INDEPENDENT_AMBULATORY_CARE_PROVIDER_SITE_OTHER): Admitting: Licensed Clinical Social Worker

## 2023-06-05 DIAGNOSIS — F331 Major depressive disorder, recurrent, moderate: Secondary | ICD-10-CM

## 2023-06-05 DIAGNOSIS — F411 Generalized anxiety disorder: Secondary | ICD-10-CM

## 2023-06-05 DIAGNOSIS — F4310 Post-traumatic stress disorder, unspecified: Secondary | ICD-10-CM | POA: Diagnosis not present

## 2023-06-05 DIAGNOSIS — F419 Anxiety disorder, unspecified: Secondary | ICD-10-CM

## 2023-06-05 DIAGNOSIS — F32A Depression, unspecified: Secondary | ICD-10-CM | POA: Diagnosis not present

## 2023-06-10 ENCOUNTER — Ambulatory Visit (HOSPITAL_COMMUNITY): Payer: No Typology Code available for payment source | Admitting: Licensed Clinical Social Worker

## 2023-06-10 DIAGNOSIS — F419 Anxiety disorder, unspecified: Secondary | ICD-10-CM | POA: Diagnosis not present

## 2023-06-10 DIAGNOSIS — F32A Depression, unspecified: Secondary | ICD-10-CM | POA: Diagnosis not present

## 2023-06-10 DIAGNOSIS — F4310 Post-traumatic stress disorder, unspecified: Secondary | ICD-10-CM

## 2023-06-10 DIAGNOSIS — F411 Generalized anxiety disorder: Secondary | ICD-10-CM

## 2023-06-10 DIAGNOSIS — F331 Major depressive disorder, recurrent, moderate: Secondary | ICD-10-CM

## 2023-06-12 ENCOUNTER — Encounter (HOSPITAL_COMMUNITY): Payer: Self-pay | Admitting: Licensed Clinical Social Worker

## 2023-06-12 ENCOUNTER — Ambulatory Visit (INDEPENDENT_AMBULATORY_CARE_PROVIDER_SITE_OTHER): Admitting: Licensed Clinical Social Worker

## 2023-06-12 DIAGNOSIS — F32A Depression, unspecified: Secondary | ICD-10-CM | POA: Diagnosis not present

## 2023-06-12 DIAGNOSIS — F4310 Post-traumatic stress disorder, unspecified: Secondary | ICD-10-CM | POA: Diagnosis not present

## 2023-06-12 DIAGNOSIS — F331 Major depressive disorder, recurrent, moderate: Secondary | ICD-10-CM

## 2023-06-12 DIAGNOSIS — F411 Generalized anxiety disorder: Secondary | ICD-10-CM

## 2023-06-12 NOTE — Progress Notes (Signed)
 Virtual Visit via Video Note  I connected with Mark [name] on 06/05/23 at 1:00-2pm EST by video-enabled telemedicine with the correct person using two identifiers.   I discussed the limitations of evaluation and management by telemedicine and the availability of in person appointments. The patient expressed understanding and agreed to proceed.  LOCATION Patient: home Provider: home office    History of Present Illness: Pt was referred to [facility] OP therapy for PTSD, depression and anxiety by the [facility].  Treatment Goal Addressed:   Pt will reduce irritability and increase normal social interaction with family and friends and will develop the ability to recognize, accept, and cope with feelings of depression and anxiety by using practiced coping skills for depression 1-2x daily AEB self report.  Progress towards Goal:  Progressing    Observations/Objective: Pt presented for today's session late due to a work meeting, oriented x5, with no evidence or self-report of SI/HI or A/V H.  Patient reported ongoing compliance with medication. Clinician inquired about patient's current emotional ratings, as well as any significant changes in thoughts, feelings or behavior since previous session. Patient reported scores of 8/10 for depression, 8/10 for anxiety, 5/10 for anger/irritability, and denied any panic attacks.  Pt identified stressors that occurred in his life over the past week. Cln and pt reviewed each stressor and coping skills used over the past week: grounding and breathing exercises:  packing up house, [place] back to [place], family, moving back to toxic environment, giving away ESA,  financial issues, chronic pain,  pain in back due to blisters, amputee disability. Pt reports on his my daughters graduation in [place] with family. Cln asked open ended questions about his previoys concerns of family environment during the celebration. Clinician utilized CBT to address thought processes support  and confidence in his decisions.       Collaboration of Care: Other: Continue seeing providers at [place].    Patient/Guardian was advised Release of Information must be obtained prior to any record release in order to collaborate their care with an outside provider. Patient/Guardian was advised if they have not already done so to contact the registration department to sign all necessary forms in order for us  to release information regarding their care.    Consent: Patient/Guardian gives verbal consent for treatment and assignment of benefits for services provided during this visit. Patient/Guardian expressed understanding and agreed to proceed.     Assessment and Plan: Counselor will continue to meet with patient and address treatment plan goals. Patient was given a chance for recommendations of providers and implement skills learned in session and encouraged to practice coping skills between sessions, practicing between sessions.Diagnosis: anxiety, depression and ptsd  Follow Up Instructions I discussed the assessment and treatment plan with the patient. The patient was provided an opportunity to ask questions and all were answered. The patient agreed with the plan and demonstrated an understanding of the instructions.   The patient was advised to call back or seek an in-person evaluation if the symptoms worsen or if the condition fails to improve as anticipated.  I provided 60 minutes of non-face-to-face time during this encounter.   Erine Phenix S, LCAS  .

## 2023-06-16 ENCOUNTER — Encounter (HOSPITAL_COMMUNITY): Payer: Self-pay | Admitting: Licensed Clinical Social Worker

## 2023-06-16 NOTE — Progress Notes (Signed)
 Virtual Visit via Video Group Note  I connected with Mark [name] on 06/10/23 at 5:00-7:00pm EST by a video enabled telemedicine application and verified that I am speaking with the correct person using two identifiers.   I discussed the limitations of evaluation and management by telemedicine and the availability of in person appointments. The patient expressed understanding and agreed to proceed.  LOCATION: Patient: Home Provider: home Office  History of Present Illness: [facility], [facility], [facility].  Treatment Goal Addressed:  Pt will develop the ability to recognize, accept, and cope with feelings of anxiety AEB self report.  Progress towards Goal:  Progressing    Observation/Objective:  .Patient participated in a discussion on Memorial Day celebrations: honoring and mourning fallen soldiers, putting flags on fellow veteran's graves, attending [employer], rendering respect of fellow soldiers. Clinician honored each veteran in this group for their respective service in the [employer].         2025 WORD: INTENTIONAL:    Assessment and Plan:  Counselor will continue to meet with patient to address treatment plan goals. Patient will continue to follow recommendations of providers and implement skills learned in session.    Follow-up instructions:I discussed the assessment and treatment plan with the patient. Patient was provided an opportunity to ask questions and all were answered. The patient agreed with the plan and demonstrated an understanding of the instructions.   The patient was advised to call back or seek an in-person evaluation if the symptoms worsen or if the condition fails to improve as anticipated.  Collaboration of Care: Other: Continue to see providers at [facility]  Patient/Guardian was advised Release of Information must be obtained prior to any record release in order to collaborate their care with an  outside provider. Patient/Guardian was advised if they have not already done so to contact the registration department to sign all necessary forms in order for us  to release information regarding their care.   Consent: Patient/Guardian gives verbal consent for treatment and assignment of benefits for services provided during this visit. Patient/Guardian expressed understanding and agreed to proceed.      I provided 120 minutes of non-face-to-face time during this encounter.   Mark [name] S, LCAS              06/10/23

## 2023-06-17 ENCOUNTER — Ambulatory Visit (HOSPITAL_COMMUNITY): Payer: No Typology Code available for payment source | Admitting: Licensed Clinical Social Worker

## 2023-06-18 ENCOUNTER — Encounter (HOSPITAL_COMMUNITY): Payer: Self-pay | Admitting: Licensed Clinical Social Worker

## 2023-06-18 ENCOUNTER — Ambulatory Visit: Admitting: Dietician

## 2023-06-18 NOTE — Progress Notes (Signed)
 Virtual Visit via Video Note  I connected with Mark [name] on 06/12/23 at 1:00-2pm EST by video-enabled telemedicine with the correct person using two identifiers.   I discussed the limitations of evaluation and management by telemedicine and the availability of in person appointments. The patient expressed understanding and agreed to proceed.  LOCATION Patient: home Provider: home office    History of Present Illness: Pt was referred to [facility] OP therapy for PTSD, depression and anxiety by the [facility].  Treatment Goal Addressed:   Pt will reduce irritability and increase normal social interaction with family and friends and will develop the ability to recognize, accept, and cope with feelings of depression and anxiety by using practiced coping skills for depression 1-2x daily AEB self report.  Progress towards Goal:  Progressing    Observations/Objective: Pt presented for today's session late due to a work meeting, oriented x5, with no evidence or self-report of SI/HI or A/V H.  Patient reported ongoing compliance with medication. Clinician inquired about patient's current emotional ratings, as well as any significant changes in thoughts, feelings or behavior since previous session. Patient reported scores of 8/10 for depression, 8/10 for anxiety, 5/10 for anger/irritability, and denied any panic attacks.  Pt identified stressors that occurred in his life over the past week. Cln and pt reviewed each stressor and coping skills used over the past week: grounding and breathing exercises:  packing up house, moving back to [place], family, moving back to toxic environment, giving away ESA,  financial issues, chronic pain,  pain in back due to blisters, amputee disability. Cln and pt discussed his disability and how it affects his life. Clinician utilized [employer] OARS to reflect and summarize thoughts and feelings about this new normal life he is experiencing.       Collaboration of Care: Other:  Continue seeing providers at [place].    Patient/Guardian was advised Release of Information must be obtained prior to any record release in order to collaborate their care with an outside provider. Patient/Guardian was advised if they have not already done so to contact the registration department to sign all necessary forms in order for us  to release information regarding their care.    Consent: Patient/Guardian gives verbal consent for treatment and assignment of benefits for services provided during this visit. Patient/Guardian expressed understanding and agreed to proceed.     Assessment and Plan: Counselor will continue to meet with patient and address treatment plan goals. Patient was given a chance for recommendations of providers and implement skills learned in session and encouraged to practice coping skills between sessions, practicing between sessions.Diagnosis: anxiety, depression and ptsd  Follow Up Instructions I discussed the assessment and treatment plan with the patient. The patient was provided an opportunity to ask questions and all were answered. The patient agreed with the plan and demonstrated an understanding of the instructions.   The patient was advised to call back or seek an in-person evaluation if the symptoms worsen or if the condition fails to improve as anticipated.  I provided 60 minutes of non-face-to-face time during this encounter.   Yanci Bachtell S, LCAS  .

## 2023-06-19 ENCOUNTER — Ambulatory Visit (HOSPITAL_COMMUNITY): Admitting: Licensed Clinical Social Worker

## 2023-06-19 ENCOUNTER — Encounter (HOSPITAL_COMMUNITY): Payer: Self-pay | Admitting: Licensed Clinical Social Worker

## 2023-06-19 DIAGNOSIS — F419 Anxiety disorder, unspecified: Secondary | ICD-10-CM

## 2023-06-19 DIAGNOSIS — F32A Depression, unspecified: Secondary | ICD-10-CM | POA: Diagnosis not present

## 2023-06-19 DIAGNOSIS — F4310 Post-traumatic stress disorder, unspecified: Secondary | ICD-10-CM | POA: Diagnosis not present

## 2023-06-19 DIAGNOSIS — F331 Major depressive disorder, recurrent, moderate: Secondary | ICD-10-CM

## 2023-06-19 DIAGNOSIS — F411 Generalized anxiety disorder: Secondary | ICD-10-CM

## 2023-06-19 NOTE — Progress Notes (Signed)
 Virtual Visit via Video Note  I connected with Mark [name] on 06/19/23 at 1:00-2pm EST by video-enabled telemedicine with the correct person using two identifiers.   I discussed the limitations of evaluation and management by telemedicine and the availability of in person appointments. The patient expressed understanding and agreed to proceed.  LOCATION Patient: home Provider: home office    History of Present Illness: Pt was referred to [facility] OP therapy for PTSD, depression and anxiety by the [facility].  Treatment Goal Addressed:   Pt will reduce irritability and increase normal social interaction with family and friends and will develop the ability to recognize, accept, and cope with feelings of depression and anxiety by using practiced coping skills for depression 1-2x daily AEB self report.  Progress towards Goal:  Progressing    Observations/Objective: Pt presented for today's session depressed, oriented x5, with no evidence or self-report of SI/HI or A/V H.  Patient reported ongoing compliance with medication. Clinician inquired about patient's current emotional ratings, as well as any significant changes in thoughts, feelings or behavior since previous session. Patient reported scores of 8/10 for depression, 8/10 for anxiety, 4/10 for anger/irritability, and denied any panic attacks. Pt reports, "My wheelchair and lift are broken and will not be fixed for 45 days, which means I'm stuck in the house until then." Cln and pt discussed alternatives: having other veterans to come over and sit outside of porch, pt riding around in his car and coming back home." Pt identified other stressors that occurred in his life over the past week. Cln and pt reviewed each stressor and coping skills used over the past week: grounding and breathing exercises:  packing up house, moving back to [place], family, moving back to toxic environment, giving away ESA,  financial issues, chronic pain,  pain in back due  to blisters, amputee disability. Clinician used CBT to help pt process thoughts, feelings, and behaviors     Collaboration of Care: Other: Continue seeing providers at [place].    Patient/Guardian was advised Release of Information must be obtained prior to any record release in order to collaborate their care with an outside provider. Patient/Guardian was advised if they have not already done so to contact the registration department to sign all necessary forms in order for us  to release information regarding their care.    Consent: Patient/Guardian gives verbal consent for treatment and assignment of benefits for services provided during this visit. Patient/Guardian expressed understanding and agreed to proceed.     Assessment and Plan: Counselor will continue to meet with patient and address treatment plan goals. Patient was given a chance for recommendations of providers and implement skills learned in session and encouraged to practice coping skills between sessions, practicing between sessions.Diagnosis: anxiety, depression and ptsd  Follow Up Instructions I discussed the assessment and treatment plan with the patient. The patient was provided an opportunity to ask questions and all were answered. The patient agreed with the plan and demonstrated an understanding of the instructions.   The patient was advised to call back or seek an in-person evaluation if the symptoms worsen or if the condition fails to improve as anticipated.  I provided 60 minutes of non-face-to-face time during this encounter.   Jurnei Latini S, LCAS  .

## 2023-06-24 ENCOUNTER — Ambulatory Visit (HOSPITAL_COMMUNITY): Admitting: Licensed Clinical Social Worker

## 2023-06-24 ENCOUNTER — Encounter (HOSPITAL_COMMUNITY): Payer: Self-pay | Admitting: Licensed Clinical Social Worker

## 2023-06-24 DIAGNOSIS — F4310 Post-traumatic stress disorder, unspecified: Secondary | ICD-10-CM | POA: Diagnosis not present

## 2023-06-24 DIAGNOSIS — F419 Anxiety disorder, unspecified: Secondary | ICD-10-CM | POA: Diagnosis not present

## 2023-06-24 DIAGNOSIS — F32A Depression, unspecified: Secondary | ICD-10-CM

## 2023-06-24 DIAGNOSIS — F331 Major depressive disorder, recurrent, moderate: Secondary | ICD-10-CM

## 2023-06-24 DIAGNOSIS — F411 Generalized anxiety disorder: Secondary | ICD-10-CM

## 2023-06-24 NOTE — Progress Notes (Signed)
 Virtual Visit via Video Group Note  I connected with Mark [name] on 06/24/23 at 5:00-7:00pm EST by a video enabled telemedicine application and verified that I am speaking with the correct person using two identifiers.   I discussed the limitations of evaluation and management by telemedicine and the availability of in person appointments. The patient expressed understanding and agreed to proceed.  LOCATION: Patient: Home Provider: home Office  History of Present Illness: [facility], [facility], [facility].  Treatment Goal Addressed:  Pt will develop the ability to recognize, accept, and cope with feelings of anxiety AEB self report.  Progress towards Goal:  Progressing    Observation/Objective:  .Patient participated in a group discussion on dealing with health issues, including chronic pain. Cln used CBT to assist patient in improving the quality of life, activities of daily living, focusing on reducing stress by modifying physical sensation, catastrophic thinking and maladaptive behaviors. Patient was encouraged to improve quality of life.      2025 WORD: INTENTIONAL:    Assessment and Plan:  Counselor will continue to meet with patient to address treatment plan goals. Patient will continue to follow recommendations of providers and implement skills learned in session.    Follow-up instructions:I discussed the assessment and treatment plan with the patient. Patient was provided an opportunity to ask questions and all were answered. The patient agreed with the plan and demonstrated an understanding of the instructions.   The patient was advised to call back or seek an in-person evaluation if the symptoms worsen or if the condition fails to improve as anticipated.  Collaboration of Care: Other: Continue to see providers at [facility]  Patient/Guardian was advised Release of Information must be obtained prior to any record release in  order to collaborate their care with an outside provider. Patient/Guardian was advised if they have not already done so to contact the registration department to sign all necessary forms in order for us  to release information regarding their care.   Consent: Patient/Guardian gives verbal consent for treatment and assignment of benefits for services provided during this visit. Patient/Guardian expressed understanding and agreed to proceed.      I provided 120 minutes of non-face-to-face time during this encounter.   Noemi Ishmael S, LCAS              06/24/23

## 2023-06-26 ENCOUNTER — Ambulatory Visit (HOSPITAL_COMMUNITY): Admitting: Licensed Clinical Social Worker

## 2023-06-26 DIAGNOSIS — F419 Anxiety disorder, unspecified: Secondary | ICD-10-CM

## 2023-06-26 DIAGNOSIS — F32A Depression, unspecified: Secondary | ICD-10-CM | POA: Diagnosis not present

## 2023-06-26 DIAGNOSIS — F4310 Post-traumatic stress disorder, unspecified: Secondary | ICD-10-CM

## 2023-06-26 DIAGNOSIS — F331 Major depressive disorder, recurrent, moderate: Secondary | ICD-10-CM

## 2023-06-26 DIAGNOSIS — F411 Generalized anxiety disorder: Secondary | ICD-10-CM

## 2023-06-27 ENCOUNTER — Encounter (HOSPITAL_COMMUNITY): Payer: Self-pay | Admitting: Licensed Clinical Social Worker

## 2023-06-27 NOTE — Progress Notes (Signed)
 Virtual Visit via Video Note  I connected with Mark [name] on 06/26/23 at 1:00-2pm EST by video-enabled telemedicine with the correct person using two identifiers.   I discussed the limitations of evaluation and management by telemedicine and the availability of in person appointments. The patient expressed understanding and agreed to proceed.  LOCATION Patient: home Provider: home office    History of Present Illness: Pt was referred to [facility] OP therapy for PTSD, depression and anxiety by the [facility].  Treatment Goal Addressed:   Pt will reduce irritability and increase normal social interaction with family and friends and will develop the ability to recognize, accept, and cope with feelings of depression and anxiety by using practiced coping skills for depression 1-2x daily AEB self report.  Progress towards Goal:  Progressing    Observations/Objective: Pt presented for today's session depressed, oriented x5, with no evidence or self-report of SI/HI or A/V H.  Patient reported ongoing compliance with medication. Clinician inquired about patient's current emotional ratings, as well as any significant changes in thoughts, feelings or behavior since previous session. Patient reported scores of 8/10 for depression, 8/10 for anxiety, 4/10 for anger/irritability, and denied any panic attacks. Pt reports, "My wheelchair and lift are still broken and will not be fixed for 45 days, which means I'm stuck in the house until then." Cln asked open ended questions. Pt identified other stressors that occurred in his life over the past week. Cln and pt reviewed each stressor and coping skills used over the past week: grounding and breathing exercises:  packing up house, moving back to [place], family, moving back to toxic environment, giving away ESA,  financial issues, chronic pain,  pain in back due to blisters, amputee disability. Clinician utilized CBT to process concerns about health, worries about  wheelchair, and work challenges. Clinician processed thoughts, feelings, and behaviors.      Collaboration of Care: Other: Continue seeing providers at [place].    Patient/Guardian was advised Release of Information must be obtained prior to any record release in order to collaborate their care with an outside provider. Patient/Guardian was advised if they have not already done so to contact the registration department to sign all necessary forms in order for us  to release information regarding their care.    Consent: Patient/Guardian gives verbal consent for treatment and assignment of benefits for services provided during this visit. Patient/Guardian expressed understanding and agreed to proceed.     Assessment and Plan: Counselor will continue to meet with patient and address treatment plan goals. Patient was given a chance for recommendations of providers and implement skills learned in session and encouraged to practice coping skills between sessions, practicing between sessions.Diagnosis: anxiety, depression and ptsd  Follow Up Instructions I discussed the assessment and treatment plan with the patient. The patient was provided an opportunity to ask questions and all were answered. The patient agreed with the plan and demonstrated an understanding of the instructions.   The patient was advised to call back or seek an in-person evaluation if the symptoms worsen or if the condition fails to improve as anticipated.  I provided 60 minutes of non-face-to-face time during this encounter.   Lema Heinkel S, LCAS  .

## 2023-07-01 ENCOUNTER — Ambulatory Visit (HOSPITAL_COMMUNITY): Admitting: Licensed Clinical Social Worker

## 2023-07-01 ENCOUNTER — Encounter (HOSPITAL_COMMUNITY): Payer: Self-pay | Admitting: Licensed Clinical Social Worker

## 2023-07-01 ENCOUNTER — Ambulatory Visit (INDEPENDENT_AMBULATORY_CARE_PROVIDER_SITE_OTHER): Admitting: Licensed Clinical Social Worker

## 2023-07-01 DIAGNOSIS — F4310 Post-traumatic stress disorder, unspecified: Secondary | ICD-10-CM | POA: Diagnosis not present

## 2023-07-01 DIAGNOSIS — F419 Anxiety disorder, unspecified: Secondary | ICD-10-CM | POA: Diagnosis not present

## 2023-07-01 DIAGNOSIS — F32A Depression, unspecified: Secondary | ICD-10-CM

## 2023-07-01 DIAGNOSIS — F411 Generalized anxiety disorder: Secondary | ICD-10-CM

## 2023-07-01 DIAGNOSIS — F331 Major depressive disorder, recurrent, moderate: Secondary | ICD-10-CM

## 2023-07-01 NOTE — Progress Notes (Signed)
 Virtual Visit via Video Group Note  I connected with Mark [name] on 07/01/23 at 5:00-7:00pm EST by a video enabled telemedicine application and verified that I am speaking with the correct person using two identifiers.   I discussed the limitations of evaluation and management by telemedicine and the availability of in person appointments. The patient expressed understanding and agreed to proceed.  LOCATION: Patient: Home Provider: home Office  History of Present Illness: [facility], depression, [facility].  Treatment Goal Addressed:  Pt will develop the ability to recognize, accept, and cope with feelings of anxiety AEB self report.  Progress towards Goal:  Progressing    Observation/Objective:  . Patient participated in a group discussion on upcoming Father's Day. It was bittersweet for some, having lost their father, not hearing from children, and remembering past Father's Days. Cln encouraged pt to remember the past bittersweet memories.     2025 WORD: INTENTIONAL:    Assessment and Plan:  Counselor will continue to meet with patient to address treatment plan goals. Patient will continue to follow recommendations of providers and implement skills learned in session.    Follow-up instructions:I discussed the assessment and treatment plan with the patient. Patient was provided an opportunity to ask questions and all were answered. The patient agreed with the plan and demonstrated an understanding of the instructions.   The patient was advised to call back or seek an in-person evaluation if the symptoms worsen or if the condition fails to improve as anticipated.  Collaboration of Care: Other: Continue to see providers at [facility]  Patient/Guardian was advised Release of Information must be obtained prior to any record release in order to collaborate their care with an outside provider. Patient/Guardian was advised if they have not  already done so to contact the registration department to sign all necessary forms in order for us  to release information regarding their care.   Consent: Patient/Guardian gives verbal consent for treatment and assignment of benefits for services provided during this visit. Patient/Guardian expressed understanding and agreed to proceed.      I provided 120 minutes of non-face-to-face time during this encounter.   Willman Cuny S, LCAS              07/01/23

## 2023-07-03 ENCOUNTER — Ambulatory Visit (INDEPENDENT_AMBULATORY_CARE_PROVIDER_SITE_OTHER): Admitting: Licensed Clinical Social Worker

## 2023-07-03 DIAGNOSIS — F419 Anxiety disorder, unspecified: Secondary | ICD-10-CM

## 2023-07-03 DIAGNOSIS — F32A Depression, unspecified: Secondary | ICD-10-CM

## 2023-07-03 DIAGNOSIS — F411 Generalized anxiety disorder: Secondary | ICD-10-CM

## 2023-07-03 DIAGNOSIS — F331 Major depressive disorder, recurrent, moderate: Secondary | ICD-10-CM

## 2023-07-03 DIAGNOSIS — F4310 Post-traumatic stress disorder, unspecified: Secondary | ICD-10-CM | POA: Diagnosis not present

## 2023-07-07 ENCOUNTER — Encounter (HOSPITAL_COMMUNITY): Payer: Self-pay | Admitting: Licensed Clinical Social Worker

## 2023-07-07 NOTE — Progress Notes (Signed)
 Virtual Visit via Video Note  I connected with Mark [name] on 07/03/23 at 1:00-2pm EST by video-enabled telemedicine with the correct person using two identifiers.   I discussed the limitations of evaluation and management by telemedicine and the availability of in person appointments. The patient expressed understanding and agreed to proceed.  LOCATION Patient: home Provider: home office    History of Present Illness: Pt was referred to [facility] OP therapy for PTSD, depression and anxiety by the [facility].  Treatment Goal Addressed:   Pt will reduce irritability and increase normal social interaction with family and friends and will develop the ability to recognize, accept, and cope with feelings of depression and anxiety by using practiced coping skills for depression 1-2x daily AEB self report.  Progress towards Goal:  Progressing    Observations/Objective: Pt presented for today'[name] session depressed, oriented x5, with no evidence or self-report of SI/HI or A/V H.  Patient reported ongoing compliance with medication. Clinician inquired about patient'[name] current emotional ratings, as well as any significant changes in thoughts, feelings or behavior since previous session. Patient reported scores of 8/10 for depression, 8/10 for anxiety, 4/10 for anger/irritability, and denied any panic attacks. Pt reports, My wheelchair is still broken and will not be fixed for 45 days, which means I'm stuck in the house until then. My lift if fixedI go to the [place] Monday to be fitted for a new wheelchair. It should be 3-4 more weeks for a new one. Cln asked open ended questions. Pt identified other stressors that occurred in his life over the past week. Cln and pt reviewed each stressor and coping skills used over the past week: grounding and breathing exercises:  packing up house, moving back to [place], family, moving back to toxic environment, giving away ESA,  financial issues, chronic pain,  pain in back due to  blisters, amputee disability. My family is not coming down for father'[name] day. Cln asked open ended questions. Again, clinician utilized CBT to process concerns about health, worries about wheelchair, and work challenges. Clinician processed thoughts, feelings, and behaviors.      Collaboration of Care: Other: Continue seeing providers at [place].    Patient/Guardian was advised Release of Information must be obtained prior to any record release in order to collaborate their care with an outside provider. Patient/Guardian was advised if they have not already done so to contact the registration department to sign all necessary forms in order for us  to release information regarding their care.    Consent: Patient/Guardian gives verbal consent for treatment and assignment of benefits for services provided during this visit. Patient/Guardian expressed understanding and agreed to proceed.     Assessment and Plan: Counselor will continue to meet with patient and address treatment plan goals. Patient was given a chance for recommendations of providers and implement skills learned in session and encouraged to practice coping skills between sessions, practicing between sessions.Diagnosis: anxiety, depression and ptsd  Follow Up Instructions I discussed the assessment and treatment plan with the patient. The patient was provided an opportunity to ask questions and all were answered. The patient agreed with the plan and demonstrated an understanding of the instructions.   The patient was advised to call back or seek an in-person evaluation if the symptoms worsen or if the condition fails to improve as anticipated.  I provided 60 minutes of non-face-to-face time during this encounter.   Mark [name], LCAS  .

## 2023-07-08 ENCOUNTER — Ambulatory Visit (HOSPITAL_COMMUNITY): Admitting: Licensed Clinical Social Worker

## 2023-07-08 ENCOUNTER — Ambulatory Visit (INDEPENDENT_AMBULATORY_CARE_PROVIDER_SITE_OTHER): Admitting: Licensed Clinical Social Worker

## 2023-07-08 ENCOUNTER — Encounter (HOSPITAL_COMMUNITY): Payer: Self-pay | Admitting: Licensed Clinical Social Worker

## 2023-07-08 DIAGNOSIS — F4310 Post-traumatic stress disorder, unspecified: Secondary | ICD-10-CM

## 2023-07-08 DIAGNOSIS — F32A Depression, unspecified: Secondary | ICD-10-CM | POA: Diagnosis not present

## 2023-07-08 DIAGNOSIS — F331 Major depressive disorder, recurrent, moderate: Secondary | ICD-10-CM

## 2023-07-08 DIAGNOSIS — F411 Generalized anxiety disorder: Secondary | ICD-10-CM

## 2023-07-08 DIAGNOSIS — F419 Anxiety disorder, unspecified: Secondary | ICD-10-CM | POA: Diagnosis not present

## 2023-07-08 NOTE — Progress Notes (Signed)
 Virtual Visit via Video Group Note  I connected with Mark [name] on 07/01/23 at 5:00-7:00pm EST by a video enabled telemedicine application and verified that I am speaking with the correct person using two identifiers.   I discussed the limitations of evaluation and management by telemedicine and the availability of in person appointments. The patient expressed understanding and agreed to proceed.  LOCATION: Patient: Home Provider: home Office  History of Present Illness: [facility], [facility], [facility].  Treatment Goal Addressed:  Pt will develop the ability to recognize, accept, and cope with feelings of anxiety AEB self report.  Progress towards Goal:  Progressing    Observation/Objective:  .Patient participated in a discussion on Father's Day last weekend. It will be bittersweet for some, having lost fathers, not hearing from children, and remembering past Father's Days. Cln encouraged pt to remember the past bittersweet memories.         2025 WORD: INTENTIONAL:    Assessment and Plan:  Counselor will continue to meet with patient to address treatment plan goals. Patient will continue to follow recommendations of providers and implement skills learned in session.    Follow-up instructions:I discussed the assessment and treatment plan with the patient. Patient was provided an opportunity to ask questions and all were answered. The patient agreed with the plan and demonstrated an understanding of the instructions.   The patient was advised to call back or seek an in-person evaluation if the symptoms worsen or if the condition fails to improve as anticipated.  Collaboration of Care: Other: Continue to see providers at [facility]  Patient/Guardian was advised Release of Information must be obtained prior to any record release in order to collaborate their care with an outside provider. Patient/Guardian was advised if they have not  already done so to contact the registration department to sign all necessary forms in order for us  to release information regarding their care.   Consent: Patient/Guardian gives verbal consent for treatment and assignment of benefits for services provided during this visit. Patient/Guardian expressed understanding and agreed to proceed.      I provided 120 minutes of non-face-to-face time during this encounter.   Zakia Sainato S, LCAS              07/08/23

## 2023-07-10 ENCOUNTER — Ambulatory Visit (INDEPENDENT_AMBULATORY_CARE_PROVIDER_SITE_OTHER): Admitting: Licensed Clinical Social Worker

## 2023-07-10 DIAGNOSIS — F411 Generalized anxiety disorder: Secondary | ICD-10-CM

## 2023-07-10 DIAGNOSIS — F32A Depression, unspecified: Secondary | ICD-10-CM

## 2023-07-10 DIAGNOSIS — F419 Anxiety disorder, unspecified: Secondary | ICD-10-CM | POA: Diagnosis not present

## 2023-07-10 DIAGNOSIS — F4310 Post-traumatic stress disorder, unspecified: Secondary | ICD-10-CM | POA: Diagnosis not present

## 2023-07-10 DIAGNOSIS — F331 Major depressive disorder, recurrent, moderate: Secondary | ICD-10-CM

## 2023-07-11 ENCOUNTER — Ambulatory Visit (HOSPITAL_COMMUNITY)

## 2023-07-15 ENCOUNTER — Ambulatory Visit (HOSPITAL_COMMUNITY): Admitting: Licensed Clinical Social Worker

## 2023-07-15 ENCOUNTER — Encounter (HOSPITAL_COMMUNITY): Payer: Self-pay | Admitting: Licensed Clinical Social Worker

## 2023-07-15 ENCOUNTER — Ambulatory Visit (INDEPENDENT_AMBULATORY_CARE_PROVIDER_SITE_OTHER): Admitting: Licensed Clinical Social Worker

## 2023-07-15 DIAGNOSIS — F32A Depression, unspecified: Secondary | ICD-10-CM

## 2023-07-15 DIAGNOSIS — F4310 Post-traumatic stress disorder, unspecified: Secondary | ICD-10-CM | POA: Diagnosis not present

## 2023-07-15 DIAGNOSIS — F419 Anxiety disorder, unspecified: Secondary | ICD-10-CM

## 2023-07-15 DIAGNOSIS — F331 Major depressive disorder, recurrent, moderate: Secondary | ICD-10-CM

## 2023-07-15 DIAGNOSIS — F411 Generalized anxiety disorder: Secondary | ICD-10-CM

## 2023-07-15 NOTE — Progress Notes (Signed)
 Virtual Visit via Video Group Note  I connected with Mark [name] on 07/15/23 at 5:00-6:00pm EST by a video enabled telemedicine application and verified that I am speaking with the correct person using two identifiers.   I discussed the limitations of evaluation and management by telemedicine and the availability of in person appointments. The patient expressed understanding and agreed to proceed.  LOCATION: Patient: Home Provider: home Office  History of Present Illness: [facility], [facility], [facility].  Treatment Goal Addressed:  Pt will develop the ability to recognize, accept, and cope with feelings of anxiety AEB self report.  Progress towards Goal:  Progressing    Observation/Objective:  .Patient participated in a discussion on overcoming life's challenges, under stress, which makes it more difficult to overcome obstacles in life. Patient was encouraged to identify life challenges and explore strategies for overcoming obstacles.       2025 WORD: INTENTIONAL:    Assessment and Plan:  Counselor will continue to meet with patient to address treatment plan goals. Patient will continue to follow recommendations of providers and implement skills learned in session.    Follow-up instructions:I discussed the assessment and treatment plan with the patient. Patient was provided an opportunity to ask questions and all were answered. The patient agreed with the plan and demonstrated an understanding of the instructions.   The patient was advised to call back or seek an in-person evaluation if the symptoms worsen or if the condition fails to improve as anticipated.  Collaboration of Care: Other: Continue to see providers at [facility]  Patient/Guardian was advised Release of Information must be obtained prior to any record release in order to collaborate their care with an outside provider. Patient/Guardian was advised if they have not  already done so to contact the registration department to sign all necessary forms in order for us  to release information regarding their care.   Consent: Patient/Guardian gives verbal consent for treatment and assignment of benefits for services provided during this visit. Patient/Guardian expressed understanding and agreed to proceed.      I provided 60 minutes of non-face-to-face time during this encounter.   Cinsere Mizrahi S, LCAS              07/15/23

## 2023-07-16 ENCOUNTER — Encounter (HOSPITAL_COMMUNITY): Payer: Self-pay | Admitting: Licensed Clinical Social Worker

## 2023-07-16 NOTE — Progress Notes (Signed)
 Virtual Visit via Video Note  I connected with Mark [name] on 07/10/23 at 1:00-2pm EST by video-enabled telemedicine with the correct person using two identifiers.   I discussed the limitations of evaluation and management by telemedicine and the availability of in person appointments. The patient expressed understanding and agreed to proceed.  LOCATION Patient: home Provider: home office    History of Present Illness: Pt was referred to [facility] OP therapy for PTSD, depression and anxiety by the [facility].  Treatment Goal Addressed:   Pt will reduce irritability and increase normal social interaction with family and friends and will develop the ability to recognize, accept, and cope with feelings of depression and anxiety by using practiced coping skills for depression 1-2x daily AEB self report.  Progress towards Goal:  Progressing    Observations/Objective: Pt presented for today's session depressed, oriented x5, with no evidence or self-report of SI/HI or A/V H.  Patient reported ongoing compliance with medication. Clinician inquired about patient's current emotional ratings, as well as any significant changes in thoughts, feelings or behavior since previous session. Patient reported scores of 8/10 for depression, 8/10 for anxiety, 4/10 for anger/irritability, and denied any panic attacks. Pt reports, My wheelchair is still broken and will not be fixed for 45 days, which means I'm stuck in the house until then. My lift if fixed. It should be 3-4 more weeks for a new one. Cln asked open ended questions. Pt identified other stressors that occurred in his life over the past week. Cln and pt reviewed each stressor and coping skills used over the past week: grounding and breathing exercises:  packing up house, moving back to [place], family, moving back to toxic environment, giving away ESA,  financial issues, chronic pain,  pain in back due to blisters, amputee disability. My family didn't t come  down for father's day. I knew they weren't coming but I thought they may surprise me. Cln asked open ended questions. Clinician utilized CBT to process concerns about his health, worries about wheelchair, and work challenges. Clinician processed thoughts, feelings, and behaviors with pt.      Collaboration of Care: Other: Continue seeing providers at [place].    Patient/Guardian was advised Release of Information must be obtained prior to any record release in order to collaborate their care with an outside provider. Patient/Guardian was advised if they have not already done so to contact the registration department to sign all necessary forms in order for us  to release information regarding their care.    Consent: Patient/Guardian gives verbal consent for treatment and assignment of benefits for services provided during this visit. Patient/Guardian expressed understanding and agreed to proceed.     Assessment and Plan: Counselor will continue to meet with patient and address treatment plan goals. Patient was given a chance for recommendations of providers and implement skills learned in session and encouraged to practice coping skills between sessions, practicing between sessions.Diagnosis: anxiety, depression and ptsd  Follow Up Instructions I discussed the assessment and treatment plan with the patient. The patient was provided an opportunity to ask questions and all were answered. The patient agreed with the plan and demonstrated an understanding of the instructions.   The patient was advised to call back or seek an in-person evaluation if the symptoms worsen or if the condition fails to improve as anticipated.  I provided 60 minutes of non-face-to-face time during this encounter.   Jesalyn Finazzo S, LCAS  .

## 2023-07-17 ENCOUNTER — Ambulatory Visit (INDEPENDENT_AMBULATORY_CARE_PROVIDER_SITE_OTHER): Admitting: Licensed Clinical Social Worker

## 2023-07-17 DIAGNOSIS — F331 Major depressive disorder, recurrent, moderate: Secondary | ICD-10-CM

## 2023-07-17 DIAGNOSIS — F419 Anxiety disorder, unspecified: Secondary | ICD-10-CM | POA: Diagnosis not present

## 2023-07-17 DIAGNOSIS — F32A Depression, unspecified: Secondary | ICD-10-CM

## 2023-07-17 DIAGNOSIS — F411 Generalized anxiety disorder: Secondary | ICD-10-CM

## 2023-07-17 DIAGNOSIS — F4310 Post-traumatic stress disorder, unspecified: Secondary | ICD-10-CM

## 2023-07-20 ENCOUNTER — Encounter (HOSPITAL_COMMUNITY): Payer: Self-pay | Admitting: Licensed Clinical Social Worker

## 2023-07-20 NOTE — Progress Notes (Signed)
 Virtual Visit via Video Note  I connected with Mark [name] on 6/125/25 at 1:00-2pm EST by video-enabled telemedicine with the correct person using two identifiers.   I discussed the limitations of evaluation and management by telemedicine and the availability of in person appointments. The patient expressed understanding and agreed to proceed.  LOCATION Patient: home Provider: home office    History of Present Illness: Pt was referred to [facility] OP therapy for PTSD, depression and anxiety by the [facility].  Treatment Goal Addressed:   Pt will reduce irritability and increase normal social interaction with family and friends and will develop the ability to recognize, accept, and cope with feelings of depression and anxiety by using practiced coping skills for depression 1-2x daily AEB self report.  Progress towards Goal:  Progressing    Observations/Objective: Pt presented for today's session depressed, oriented x5, with no evidence or self-report of SI/HI or A/V H.  Patient reported ongoing compliance with medication. Clinician inquired about patient's current emotional ratings, as well as any significant changes in thoughts, feelings or behavior since previous session. Patient reported scores of 8/10 for depression, 8/10 for anxiety, 4/10 for anger/irritability, and denied any panic attacks. Pt reports, My wheelchair is still broken and will not be fixed for 35 more days, which means I'm stuck in the house until then. My lift if fixed.  Cln asked open ended questions about alternatives to waiting, which is increasing his depressive symptoms.Pt identified other stressors that occurred in his life,  along with coping skills used over the past week:  grounding and breathing exercises:  Stressors: packing up house, moving back to [place], family, moving back to toxic environment, giving away ESA,  financial issues, chronic pain,  pain in back due to blisters, amputee disability. Cln provided  psychoeducation on the cycle of depression, pointing out the positives in his life currently.        Collaboration of Care: Other: Continue seeing providers at [facility].    Patient/Guardian was advised Release of Information must be obtained prior to any record release in order to collaborate their care with an outside provider. Patient/Guardian was advised if they have not already done so to contact the registration department to sign all necessary forms in order for us  to release information regarding their care.    Consent: Patient/Guardian gives verbal consent for treatment and assignment of benefits for services provided during this visit. Patient/Guardian expressed understanding and agreed to proceed.     Assessment and Plan: Counselor will continue to meet with patient and address treatment plan goals. Patient was given a chance for recommendations of providers and implement skills learned in session and encouraged to practice coping skills between sessions, practicing between sessions.Diagnosis: anxiety, depression and ptsd  Follow Up Instructions I discussed the assessment and treatment plan with the patient. The patient was provided an opportunity to ask questions and all were answered. The patient agreed with the plan and demonstrated an understanding of the instructions.   The patient was advised to call back or seek an in-person evaluation if the symptoms worsen or if the condition fails to improve as anticipated.  I provided 60 minutes of non-face-to-face time during this encounter.   Darvell Monteforte S, LCAS  .

## 2023-07-22 ENCOUNTER — Ambulatory Visit (HOSPITAL_COMMUNITY): Admitting: Licensed Clinical Social Worker

## 2023-07-22 ENCOUNTER — Encounter (HOSPITAL_COMMUNITY): Payer: Self-pay | Admitting: Licensed Clinical Social Worker

## 2023-07-22 ENCOUNTER — Ambulatory Visit (INDEPENDENT_AMBULATORY_CARE_PROVIDER_SITE_OTHER): Admitting: Licensed Clinical Social Worker

## 2023-07-22 DIAGNOSIS — F411 Generalized anxiety disorder: Secondary | ICD-10-CM

## 2023-07-22 DIAGNOSIS — F419 Anxiety disorder, unspecified: Secondary | ICD-10-CM | POA: Diagnosis not present

## 2023-07-22 DIAGNOSIS — F4310 Post-traumatic stress disorder, unspecified: Secondary | ICD-10-CM | POA: Diagnosis not present

## 2023-07-22 DIAGNOSIS — F331 Major depressive disorder, recurrent, moderate: Secondary | ICD-10-CM

## 2023-07-22 DIAGNOSIS — F32A Depression, unspecified: Secondary | ICD-10-CM | POA: Diagnosis not present

## 2023-07-22 NOTE — Progress Notes (Addendum)
 Virtual Visit via Video Group Note  I connected with Camellia Fickle on 630/25 at 5:00-6:15pm EST by a video enabled telemedicine application and verified that I am speaking with the correct person using two identifiers.   I discussed the limitations of evaluation and management by telemedicine and the availability of in person appointments. The patient expressed understanding and agreed to proceed.  LOCATION: Patient: Home Provider: home Office  History of Present Illness: [facility], [facility].  Treatment Goal Addressed:  Pt will develop the ability to recognize, accept, and cope with feelings of anxiety AEB self report.  Progress towards Goal:  Progressing    Observation/Objective:   Patient participated in a group discussion on changing emotional responses, due to upcoming 4th of July holiday, including the possibility of fireworks, which can cause symptoms of PTSD.  Cln encouraged patient to pause and check emotional responses.     2025 WORD: INTENTIONAL:    Assessment and Plan:  Counselor will continue to meet with patient to address treatment plan goals. Patient will continue to follow recommendations of providers and implement skills learned in session.    Follow-up instructions:I discussed the assessment and treatment plan with the patient. Patient was provided an opportunity to ask questions and all were answered. The patient agreed with the plan and demonstrated an understanding of the instructions.   The patient was advised to call back or seek an in-person evaluation if the symptoms worsen or if the condition fails to improve as anticipated.  Collaboration of Care: Other: Continue to see providers at [facility]  Patient/Guardian was advised Release of Information must be obtained prior to any record release in order to collaborate their care with an outside provider. Patient/Guardian was advised if they have not  already done so to contact the registration department to sign all necessary forms in order for us  to release information regarding their care.   Consent: Patient/Guardian gives verbal consent for treatment and assignment of benefits for services provided during this visit. Patient/Guardian expressed understanding and agreed to proceed.      I provided 75 minutes of non-face-to-face time during this encounter.   Catha Ontko S, LCAS              07/22/23

## 2023-07-24 ENCOUNTER — Ambulatory Visit (HOSPITAL_COMMUNITY): Admitting: Licensed Clinical Social Worker

## 2023-07-24 ENCOUNTER — Encounter (HOSPITAL_COMMUNITY): Payer: Self-pay | Admitting: Licensed Clinical Social Worker

## 2023-07-24 DIAGNOSIS — F4310 Post-traumatic stress disorder, unspecified: Secondary | ICD-10-CM

## 2023-07-24 DIAGNOSIS — F411 Generalized anxiety disorder: Secondary | ICD-10-CM

## 2023-07-24 DIAGNOSIS — F331 Major depressive disorder, recurrent, moderate: Secondary | ICD-10-CM

## 2023-07-24 DIAGNOSIS — F419 Anxiety disorder, unspecified: Secondary | ICD-10-CM

## 2023-07-24 DIAGNOSIS — F32A Depression, unspecified: Secondary | ICD-10-CM

## 2023-07-24 NOTE — Progress Notes (Signed)
 Virtual Visit via Video Note  I connected with Mark [name] on 07/24/23 at 1:00-2pm EST by video-enabled telemedicine with the correct person using two identifiers.   I discussed the limitations of evaluation and management by telemedicine and the availability of in person appointments. The patient expressed understanding and agreed to proceed.  LOCATION Patient: home Provider: home office    History of Present Illness: Pt was referred to [facility] OP therapy for PTSD, depression and anxiety by the [facility].  Treatment Goal Addressed:   Pt will reduce irritability and increase normal social interaction with family and friends and will develop the ability to recognize, accept, and cope with feelings of depression and anxiety by using practiced coping skills for depression 1-2x daily AEB self report.  Progress towards Goal:  Progressing    Observations/Objective: Pt presented for today's session depressed, oriented x5, with no evidence or self-report of SI/HI or A/V H.  Patient reported ongoing compliance with medication. Clinician inquired about patient's current emotional ratings, as well as any significant changes in thoughts, feelings or behavior since previous session. Patient reported scores of 8/10 for depression, 8/10 for anxiety, 4/10 for anger/irritability, and denied any panic attacks. Pt reports, My wheelchair is still broken and will not be fixed for 25 more days, which means I'm stuck in the house until then. My lift if fixed.  Cln asked open ended questions about alternatives to waiting, which is increasing his depressive symptoms. Pt identified other stressors that occurred in his life,  along with coping skills used over the past week:  grounding and breathing exercises:  Stressors: packing up house, moving back to [place], family, moving back to toxic environment, giving away ESA,  financial issues, chronic pain,  pain in back due to blisters, amputee disability. Again, cln provided  psychoeducation on the cycle of depression, pointing out the positives in his life currently, which should assist in pt lowering his depressive symptoms.        Collaboration of Care: Other: Continue seeing providers at [facility].    Patient/Guardian was advised Release of Information must be obtained prior to any record release in order to collaborate their care with an outside provider. Patient/Guardian was advised if they have not already done so to contact the registration department to sign all necessary forms in order for us  to release information regarding their care.    Consent: Patient/Guardian gives verbal consent for treatment and assignment of benefits for services provided during this visit. Patient/Guardian expressed understanding and agreed to proceed.     Assessment and Plan: Counselor will continue to meet with patient and address treatment plan goals. Patient was given a chance for recommendations of providers and implement skills learned in session and encouraged to practice coping skills between sessions, practicing between sessions.Diagnosis: anxiety, depression and ptsd  Follow Up Instructions I discussed the assessment and treatment plan with the patient. The patient was provided an opportunity to ask questions and all were answered. The patient agreed with the plan and demonstrated an understanding of the instructions.   The patient was advised to call back or seek an in-person evaluation if the symptoms worsen or if the condition fails to improve as anticipated.  I provided 60 minutes of non-face-to-face time during this encounter.   Elam Ellis S, LCAS  .

## 2023-07-29 ENCOUNTER — Ambulatory Visit (HOSPITAL_COMMUNITY): Admitting: Licensed Clinical Social Worker

## 2023-07-29 ENCOUNTER — Encounter (HOSPITAL_COMMUNITY): Payer: Self-pay | Admitting: Licensed Clinical Social Worker

## 2023-07-29 ENCOUNTER — Ambulatory Visit (INDEPENDENT_AMBULATORY_CARE_PROVIDER_SITE_OTHER): Admitting: Licensed Clinical Social Worker

## 2023-07-29 DIAGNOSIS — F411 Generalized anxiety disorder: Secondary | ICD-10-CM

## 2023-07-29 DIAGNOSIS — F32A Depression, unspecified: Secondary | ICD-10-CM

## 2023-07-29 DIAGNOSIS — F4310 Post-traumatic stress disorder, unspecified: Secondary | ICD-10-CM | POA: Diagnosis not present

## 2023-07-29 DIAGNOSIS — F419 Anxiety disorder, unspecified: Secondary | ICD-10-CM | POA: Diagnosis not present

## 2023-07-29 DIAGNOSIS — F331 Major depressive disorder, recurrent, moderate: Secondary | ICD-10-CM

## 2023-07-29 NOTE — Progress Notes (Signed)
 Virtual Visit via Video Group Note  I connected with Mark [name] on 07/29/23 at 5:00-6:30 pm EST by a video enabled telemedicine application and verified that I am speaking with the correct person using two identifiers.   I discussed the limitations of evaluation and management by telemedicine and the availability of in person appointments. The patient expressed understanding and agreed to proceed.  LOCATION: Patient: Home Provider: home Office  History of Present Illness: [facility], [facility], [facility].  Treatment Goal Addressed:  Pt will develop the ability to recognize, accept, and cope with feelings of anxiety AEB self report.  Progress towards Goal:  Progressing    Observation/Objective:  Patient participated in a group discussion on the stressors of fireworks during the July 4th celebration over the weekend. Patient discussed how the fireworks affected him during the holiday, affecting his mental health and PTSD symptoms. Patient was encouraged to use his identified coping skills to assist during the fireworks celebrations.    2025 WORD: INTENTIONAL:    Assessment and Plan:  Counselor will continue to meet with patient to address treatment plan goals. Patient will continue to follow recommendations of providers and implement skills learned in session.    Follow-up instructions:I discussed the assessment and treatment plan with the patient. Patient was provided an opportunity to ask questions and all were answered. The patient agreed with the plan and demonstrated an understanding of the instructions.   The patient was advised to call back or seek an in-person evaluation if the symptoms worsen or if the condition fails to improve as anticipated.  Collaboration of Care: Other: Continue to see providers at [facility]  Patient/Guardian was advised Release of Information must be obtained prior to any record release in order to  collaborate their care with an outside provider. Patient/Guardian was advised if they have not already done so to contact the registration department to sign all necessary forms in order for us  to release information regarding their care.   Consent: Patient/Guardian gives verbal consent for treatment and assignment of benefits for services provided during this visit. Patient/Guardian expressed understanding and agreed to proceed.      I provided 90 minutes of non-face-to-face time during this encounter.   Jerzee Jerome S, LCAS              07/29/23

## 2023-07-31 ENCOUNTER — Ambulatory Visit (INDEPENDENT_AMBULATORY_CARE_PROVIDER_SITE_OTHER): Admitting: Licensed Clinical Social Worker

## 2023-07-31 DIAGNOSIS — F331 Major depressive disorder, recurrent, moderate: Secondary | ICD-10-CM | POA: Diagnosis not present

## 2023-07-31 DIAGNOSIS — F411 Generalized anxiety disorder: Secondary | ICD-10-CM

## 2023-07-31 DIAGNOSIS — F4310 Post-traumatic stress disorder, unspecified: Secondary | ICD-10-CM

## 2023-08-04 ENCOUNTER — Encounter (HOSPITAL_COMMUNITY): Payer: Self-pay | Admitting: Licensed Clinical Social Worker

## 2023-08-04 NOTE — Progress Notes (Signed)
 Virtual Visit via Video Note  I connected with Mark [name] on 79/25 at 4-5pm EST by video-enabled telemedicine with the correct person using two identifiers.   I discussed the limitations of evaluation and management by telemedicine and the availability of in person appointments. The patient expressed understanding and agreed to proceed.  LOCATION Patient: home Provider: home office    History of Present Illness: Pt was referred to [facility] OP therapy for PTSD, depression and anxiety by the [facility].  Treatment Goal Addressed:   Pt will reduce irritability and increase normal social interaction with family and friends and will develop the ability to recognize, accept, and cope with feelings of depression and anxiety by using practiced coping skills for depression 1-2x daily AEB self report.  Progress towards Goal:  Progressing    Observations/Objective: Pt presented for today's session depressed, oriented x5, with no evidence or self-report of SI/HI or A/V H.  Patient reported ongoing compliance with medication. Clinician inquired about patient's current emotional ratings, as well as any significant changes in thoughts, feelings or behavior since previous session. Patient reported scores of 8/10 for depression, 8/10 for anxiety, 4/10 for anger/irritability, and denied any panic attacks. Pt reports, My wheelchair is still broken and will not be fixed for about 2 more weeks hopefully., which means I'm stuck in the house until then. Its been broken 2 months.  Again, cln asked open ended questions about alternatives to waiting, which is increasing his depressive symptoms. Pt identified other stressors that occurred in his life, along with coping skills used over the past week:  grounding and breathing exercises:  Stressors: packing up house, moving back to [place], family, moving back to toxic environment, giving away ESA,  financial issues, chronic pain,  pain in back due to blisters, amputee  disability. Clinician utilized MI OARS to reflect and summarize thoughts and feelings about this new normal life he is experiencing.          Collaboration of Care: Other: Continue seeing providers at [facility].    Patient/Guardian was advised Release of Information must be obtained prior to any record release in order to collaborate their care with an outside provider. Patient/Guardian was advised if they have not already done so to contact the registration department to sign all necessary forms in order for us  to release information regarding their care.    Consent: Patient/Guardian gives verbal consent for treatment and assignment of benefits for services provided during this visit. Patient/Guardian expressed understanding and agreed to proceed.     Assessment and Plan: Counselor will continue to meet with patient and address treatment plan goals. Patient was given a chance for recommendations of providers and implement skills learned in session and encouraged to practice coping skills between sessions, practicing between sessions.Diagnosis: anxiety, depression and ptsd  Follow Up Instructions I discussed the assessment and treatment plan with the patient. The patient was provided an opportunity to ask questions and all were answered. The patient agreed with the plan and demonstrated an understanding of the instructions.   The patient was advised to call back or seek an in-person evaluation if the symptoms worsen or if the condition fails to improve as anticipated.  I provided 60 minutes of non-face-to-face time during this encounter.   Kashay Cavenaugh S, LCAS  .

## 2023-08-05 ENCOUNTER — Ambulatory Visit (HOSPITAL_COMMUNITY): Admitting: Licensed Clinical Social Worker

## 2023-08-05 ENCOUNTER — Ambulatory Visit (HOSPITAL_COMMUNITY): Attending: Physician Assistant

## 2023-08-05 ENCOUNTER — Encounter (HOSPITAL_COMMUNITY): Payer: Self-pay | Admitting: Licensed Clinical Social Worker

## 2023-08-05 ENCOUNTER — Ambulatory Visit (INDEPENDENT_AMBULATORY_CARE_PROVIDER_SITE_OTHER): Admitting: Licensed Clinical Social Worker

## 2023-08-05 DIAGNOSIS — F411 Generalized anxiety disorder: Secondary | ICD-10-CM

## 2023-08-05 DIAGNOSIS — F419 Anxiety disorder, unspecified: Secondary | ICD-10-CM

## 2023-08-05 DIAGNOSIS — F331 Major depressive disorder, recurrent, moderate: Secondary | ICD-10-CM

## 2023-08-05 DIAGNOSIS — F4310 Post-traumatic stress disorder, unspecified: Secondary | ICD-10-CM

## 2023-08-05 DIAGNOSIS — F32A Depression, unspecified: Secondary | ICD-10-CM

## 2023-08-05 NOTE — Progress Notes (Signed)
 Virtual Visit via Video Group Note  I connected with Mark [name] on 08/05/23 at 5:00-6:30 pm EST by a video enabled telemedicine application and verified that I am speaking with the correct person using two identifiers.   I discussed the limitations of evaluation and management by telemedicine and the availability of in person appointments. The patient expressed understanding and agreed to proceed.  LOCATION: Patient: Home Provider: home Office  History of Present Illness: [facility], [facility], [facility].  Treatment Goal Addressed:  Pt will develop the ability to recognize, accept, and cope with feelings of anxiety AEB self report.  Progress towards Goal:  Progressing    Observation/Objective:  Patient participated in a grou[p discussion on dealing with chronic pain. Clinician used CBT to assist patients in improving their quality of life, activities of daily living, focusing on reducing pain and distress by modifying physical sensation, catastrophic thinking and maladaptive behaviors. Patient was encouraged to improve quality of life.     2025 WORD: INTENTIONAL:    Assessment and Plan:  Counselor will continue to meet with patient to address treatment plan goals. Patient will continue to follow recommendations of providers and implement skills learned in session.    Follow-up instructions:I discussed the assessment and treatment plan with the patient. Patient was provided an opportunity to ask questions and all were answered. The patient agreed with the plan and demonstrated an understanding of the instructions.   The patient was advised to call back or seek an in-person evaluation if the symptoms worsen or if the condition fails to improve as anticipated.  Collaboration of Care: Other: Continue to see providers at [facility]  Patient/Guardian was advised Release of Information must be obtained prior to any record release in order to  collaborate their care with an outside provider. Patient/Guardian was advised if they have not already done so to contact the registration department to sign all necessary forms in order for us  to release information regarding their care.   Consent: Patient/Guardian gives verbal consent for treatment and assignment of benefits for services provided during this visit. Patient/Guardian expressed understanding and agreed to proceed.      I provided 90 minutes of non-face-to-face time during this encounter.   Mark [name]              08/05/23

## 2023-08-07 ENCOUNTER — Ambulatory Visit (HOSPITAL_COMMUNITY): Admitting: Licensed Clinical Social Worker

## 2023-08-08 ENCOUNTER — Encounter (HOSPITAL_COMMUNITY): Payer: Self-pay | Admitting: Licensed Clinical Social Worker

## 2023-08-08 ENCOUNTER — Ambulatory Visit (HOSPITAL_COMMUNITY): Admitting: Licensed Clinical Social Worker

## 2023-08-08 DIAGNOSIS — F331 Major depressive disorder, recurrent, moderate: Secondary | ICD-10-CM

## 2023-08-08 DIAGNOSIS — F4310 Post-traumatic stress disorder, unspecified: Secondary | ICD-10-CM

## 2023-08-08 DIAGNOSIS — F411 Generalized anxiety disorder: Secondary | ICD-10-CM

## 2023-08-08 DIAGNOSIS — F32A Depression, unspecified: Secondary | ICD-10-CM | POA: Diagnosis not present

## 2023-08-08 DIAGNOSIS — F419 Anxiety disorder, unspecified: Secondary | ICD-10-CM | POA: Diagnosis not present

## 2023-08-08 NOTE — Progress Notes (Signed)
 Virtual Visit via Video Note  I connected with Mark [name] on 08/08/23 at 1-2pm EST by video-enabled telemedicine with the correct person using two identifiers.   I discussed the limitations of evaluation and management by telemedicine and the availability of in person appointments. The patient expressed understanding and agreed to proceed.  LOCATION Patient: home Provider: home office    History of Present Illness: Pt was referred to [facility] OP therapy for PTSD, depression and anxiety by the [facility].  Treatment Goal Addressed:   Pt will reduce irritability and increase normal social interaction with family and friends and will develop the ability to recognize, accept, and cope with feelings of depression and anxiety by using practiced coping skills for depression 1-2x daily AEB self report.  Progress towards Goal:  Progressing    Observations/Objective: Pt presented for today's session depressed, oriented x5, with no evidence or self-report of SI/HI or A/V H.  Patient reported ongoing compliance with medication. Clinician inquired about patient's current emotional ratings, as well as any significant changes in thoughts, feelings or behavior since previous session. Patient reported scores of 8/10 for depression, 8/10 for anxiety, 4/10 for anger/irritability, and denied any panic attacks. Pt reports, My wheelchair is still broken and will not be fixed for about 2 more weeks hopefully., which means I'm stuck in the house until then. Its been broken 2 months.  Cln asked open ended questions about alternatives to waiting, which is increasing his depressive symptoms. Pt identified other stressors that occurred in his life, along with coping skills used over the past week:  grounding and breathing exercises:  Stressors: packing up house, moving back to [place], family, moving back to toxic environment, giving away ESA,  financial issues, chronic pain,  pain in back due to blisters, amputee disability.  Clinician utilized CBT to process concerns about health, worries about family, and work challenges. Clinician processed thoughts, feelings, and behaviors. Clinician discussed coping skills and options for supporting himself through these challenging times.        Collaboration of Care: Other: Continue seeing providers at [facility].    Patient/Guardian was advised Release of Information must be obtained prior to any record release in order to collaborate their care with an outside provider. Patient/Guardian was advised if they have not already done so to contact the registration department to sign all necessary forms in order for us  to release information regarding their care.    Consent: Patient/Guardian gives verbal consent for treatment and assignment of benefits for services provided during this visit. Patient/Guardian expressed understanding and agreed to proceed.     Assessment and Plan: Counselor will continue to meet with patient and address treatment plan goals. Patient was given a chance for recommendations of providers and implement skills learned in session and encouraged to practice coping skills between sessions, practicing between sessions.Diagnosis: anxiety, depression and ptsd  Follow Up Instructions I discussed the assessment and treatment plan with the patient. The patient was provided an opportunity to ask questions and all were answered. The patient agreed with the plan and demonstrated an understanding of the instructions.   The patient was advised to call back or seek an in-person evaluation if the symptoms worsen or if the condition fails to improve as anticipated.  I provided 60 minutes of non-face-to-face time during this encounter.   Dollie Bressi S, LCAS  .

## 2023-08-12 ENCOUNTER — Ambulatory Visit (HOSPITAL_COMMUNITY): Admitting: Licensed Clinical Social Worker

## 2023-08-12 ENCOUNTER — Encounter (HOSPITAL_COMMUNITY): Payer: Self-pay | Admitting: Licensed Clinical Social Worker

## 2023-08-12 DIAGNOSIS — F4310 Post-traumatic stress disorder, unspecified: Secondary | ICD-10-CM | POA: Diagnosis not present

## 2023-08-12 DIAGNOSIS — F32A Depression, unspecified: Secondary | ICD-10-CM

## 2023-08-12 DIAGNOSIS — F331 Major depressive disorder, recurrent, moderate: Secondary | ICD-10-CM

## 2023-08-12 DIAGNOSIS — F419 Anxiety disorder, unspecified: Secondary | ICD-10-CM

## 2023-08-12 DIAGNOSIS — F411 Generalized anxiety disorder: Secondary | ICD-10-CM

## 2023-08-12 NOTE — Progress Notes (Signed)
 Virtual Visit via Video Group Note  I connected with Mark [name] on 08/12/23 at 5:00-6:30 pm EST by a video enabled telemedicine application and verified that I am speaking with the correct person using two identifiers.   I discussed the limitations of evaluation and management by telemedicine and the availability of in person appointments. The patient expressed understanding and agreed to proceed.  LOCATION: Patient: Home Provider: home Office  History of Present Illness: [facility], [facility], [facility].  Treatment Goal Addressed:  Pt will develop the ability to recognize, accept, and cope with feelings of anxiety AEB self report.  Progress towards Goal:  Progressing    Observation/Objective:  Patient participated in a group discussion on dealing with negative emotions in a healthy way. Cln provided psychoeducation on dealing with negative emotions in healthy, positive way. Patient was encouraged to use PATH: Pause, Acknowledge, Think, Help.        2025 WORD: INTENTIONAL:    Assessment and Plan:  Counselor will continue to meet with patient to address treatment plan goals. Patient will continue to follow recommendations of providers and implement skills learned in session.    Follow-up instructions:I discussed the assessment and treatment plan with the patient. Patient was provided an opportunity to ask questions and all were answered. The patient agreed with the plan and demonstrated an understanding of the instructions.   The patient was advised to call back or seek an in-person evaluation if the symptoms worsen or if the condition fails to improve as anticipated.  Collaboration of Care: Other: Continue to see providers at [facility]  Patient/Guardian was advised Release of Information must be obtained prior to any record release in order to collaborate their care with an outside provider. Patient/Guardian was advised if they  have not already done so to contact the registration department to sign all necessary forms in order for us  to release information regarding their care.   Consent: Patient/Guardian gives verbal consent for treatment and assignment of benefits for services provided during this visit. Patient/Guardian expressed understanding and agreed to proceed.      I provided 90 minutes of non-face-to-face time during this encounter.   Alfa Leibensperger S, LCAS              08/12/23

## 2023-08-14 ENCOUNTER — Ambulatory Visit (INDEPENDENT_AMBULATORY_CARE_PROVIDER_SITE_OTHER): Admitting: Licensed Clinical Social Worker

## 2023-08-14 DIAGNOSIS — F411 Generalized anxiety disorder: Secondary | ICD-10-CM | POA: Diagnosis not present

## 2023-08-14 DIAGNOSIS — F4310 Post-traumatic stress disorder, unspecified: Secondary | ICD-10-CM

## 2023-08-14 DIAGNOSIS — F331 Major depressive disorder, recurrent, moderate: Secondary | ICD-10-CM

## 2023-08-19 ENCOUNTER — Encounter (HOSPITAL_COMMUNITY): Payer: Self-pay | Admitting: Licensed Clinical Social Worker

## 2023-08-19 ENCOUNTER — Ambulatory Visit (HOSPITAL_COMMUNITY): Admitting: Licensed Clinical Social Worker

## 2023-08-19 DIAGNOSIS — F32A Depression, unspecified: Secondary | ICD-10-CM

## 2023-08-19 DIAGNOSIS — F4310 Post-traumatic stress disorder, unspecified: Secondary | ICD-10-CM | POA: Diagnosis not present

## 2023-08-19 DIAGNOSIS — F419 Anxiety disorder, unspecified: Secondary | ICD-10-CM

## 2023-08-19 DIAGNOSIS — F411 Generalized anxiety disorder: Secondary | ICD-10-CM

## 2023-08-19 DIAGNOSIS — F331 Major depressive disorder, recurrent, moderate: Secondary | ICD-10-CM

## 2023-08-19 NOTE — Progress Notes (Signed)
 Virtual Visit via Video Group Note  I connected with Mark [name] on 08/19/23 at 5:00-7:00 pm EST by a video enabled telemedicine application and verified that I am speaking with the correct person using two identifiers.   I discussed the limitations of evaluation and management by telemedicine and the availability of in person appointments. The patient expressed understanding and agreed to proceed.  LOCATION: Patient: Home Provider: home Office  History of Present Illness: [facility], depression, anxiety.  Treatment Goal Addressed:  Pt will develop the ability to recognize, accept, and cope with feelings of anxiety AEB self report.  Progress towards Goal:  Progressing    Observation/Objective:  Patient participated in a group discussion about relationships: communication (listening and communicating with openness and honesty), togetherness (sharing similar values and beliefs and sharing activities (things in common). Patient was encouraged to have open and positive communication, togetherness and have similar activities in relationships.      2025 WORD: INTENTIONAL:    Assessment and Plan:  Counselor will continue to meet with patient to address treatment plan goals. Patient will continue to follow recommendations of providers and implement skills learned in session.    Follow-up instructions:I discussed the assessment and treatment plan with the patient. Patient was provided an opportunity to ask questions and all were answered. The patient agreed with the plan and demonstrated an understanding of the instructions.   The patient was advised to call back or seek an in-person evaluation if the symptoms worsen or if the condition fails to improve as anticipated.  Collaboration of Care: Other: Continue to see providers at [facility]  Patient/Guardian was advised Release of Information must be obtained prior to any record release in  order to collaborate their care with an outside provider. Patient/Guardian was advised if they have not already done so to contact the registration department to sign all necessary forms in order for us  to release information regarding their care.   Consent: Patient/Guardian gives verbal consent for treatment and assignment of benefits for services provided during this visit. Patient/Guardian expressed understanding and agreed to proceed.      I provided 120 minutes of non-face-to-face time during this encounter.   Yuepheng Schaller S, LCAS              08/19/23

## 2023-08-19 NOTE — Progress Notes (Signed)
 Virtual Visit via Video Note  I connected with Mark [name] on 08/14/23 at 1-2pm EST by video-enabled telemedicine with the correct person using two identifiers.   I discussed the limitations of evaluation and management by telemedicine and the availability of in person appointments. The patient expressed understanding and agreed to proceed.  LOCATION Patient: home Provider: home office    History of Present Illness: Pt was referred to [facility] OP therapy for PTSD, depression and anxiety by the [facility].  Treatment Goal Addressed:   Pt will reduce irritability and increase normal social interaction with family and friends and will develop the ability to recognize, accept, and cope with feelings of depression and anxiety by using practiced coping skills for depression 1-2x daily AEB self report.  Progress towards Goal:  Progressing    Observations/Objective: Pt presented for today's session depressed, oriented x5, with no evidence or self-report of SI/HI or A/V H.  Patient reported ongoing compliance with medication. Clinician inquired about patient's current emotional ratings, as well as any significant changes in thoughts, feelings or behavior since previous session. Patient reported scores of 8/10 for depression, 8/10 for anxiety, 4/10 for anger/irritability, and denied any panic attacks. Pt reports, My wheelchair is still broken and will not be fixed for a while. I continue to be stuck in the house until then. Its been broken 10 weeks..  Cln asked open ended questions about alternatives to waiting, which is increasing his depressive symptoms. Pt identified other stressors that occurred in his life, along with coping skills used over the past week:  grounding and breathing exercises:  Stressors: packing up house, moving back to [place], family, moving back to toxic environment, giving away ESA,  financial issues, chronic pain,  pain in back due to blisters, amputee disability. Clinician utilized  MI OARS to reflect and summarize thoughts and feelings about this new normal life he is experiencing.          Collaboration of Care: Other: Continue seeing providers at [place].    Patient/Guardian was advised Release of Information must be obtained prior to any record release in order to collaborate their care with an outside provider. Patient/Guardian was advised if they have not already done so to contact the registration department to sign all necessary forms in order for us  to release information regarding their care.    Consent: Patient/Guardian gives verbal consent for treatment and assignment of benefits for services provided during this visit. Patient/Guardian expressed understanding and agreed to proceed.     Assessment and Plan: Counselor will continue to meet with patient and address treatment plan goals. Patient was given a chance for recommendations of providers and implement skills learned in session and encouraged to practice coping skills between sessions, practicing between sessions.Diagnosis: anxiety, depression and ptsd  Follow Up Instructions I discussed the assessment and treatment plan with the patient. The patient was provided an opportunity to ask questions and all were answered. The patient agreed with the plan and demonstrated an understanding of the instructions.   The patient was advised to call back or seek an in-person evaluation if the symptoms worsen or if the condition fails to improve as anticipated.  I provided 60 minutes of non-face-to-face time during this encounter.   Arnez Stoneking S, LCAS  .

## 2023-08-21 ENCOUNTER — Encounter (HOSPITAL_COMMUNITY): Payer: Self-pay | Admitting: Licensed Clinical Social Worker

## 2023-08-21 ENCOUNTER — Ambulatory Visit (HOSPITAL_COMMUNITY): Admitting: Licensed Clinical Social Worker

## 2023-08-21 DIAGNOSIS — F419 Anxiety disorder, unspecified: Secondary | ICD-10-CM | POA: Diagnosis not present

## 2023-08-21 DIAGNOSIS — F331 Major depressive disorder, recurrent, moderate: Secondary | ICD-10-CM

## 2023-08-21 DIAGNOSIS — F4310 Post-traumatic stress disorder, unspecified: Secondary | ICD-10-CM | POA: Diagnosis not present

## 2023-08-21 DIAGNOSIS — F411 Generalized anxiety disorder: Secondary | ICD-10-CM

## 2023-08-21 DIAGNOSIS — F32A Depression, unspecified: Secondary | ICD-10-CM

## 2023-08-21 NOTE — Progress Notes (Signed)
 Virtual Visit via Video Note  I connected with Mark [name] on 08/21/23 at 1-2pm EST by video-enabled telemedicine with the correct person using two identifiers.   I discussed the limitations of evaluation and management by telemedicine and the availability of in person appointments. The patient expressed understanding and agreed to proceed.  LOCATION Patient: home Provider: home office    History of Present Illness: Pt was referred to [facility] OP therapy for PTSD, depression and anxiety by the [facility].  Treatment Goal Addressed:   Pt will reduce irritability and increase normal social interaction with family and friends and will develop the ability to recognize, accept, and cope with feelings of depression and anxiety by using practiced coping skills for depression 1-2x daily AEB self report.  Progress towards Goal:  Progressing    Observations/Objective: Pt presented for today's session depressed, oriented x5, with no evidence or self-report of SI/HI or A/V H.  Patient reported ongoing compliance with medication. Clinician inquired about patient's current emotional ratings, as well as any significant changes in thoughts, feelings or behavior since previous session. Patient reported scores of 8/10 for depression, 8/10 for anxiety, 4/10 for anger/irritability, and denied any panic attacks. Pt reports, My wheelchair is still broken and will not be fixed for a while. I continue to be stuck in the house until then. Its been broken 11 weeks..  Cln asked open ended questions and discussed the delivery date for new wheelchair is next week. Cln asked open ended questions.. Pt identified other stressors that occurred in his life, along with coping skills used over the past week:  grounding and breathing exercises:  Stressors: packing up house, family dysfunction,,moving back to [place], family, moving back to toxic environment, giving away ESA,  financial issues, chronic pain,  pain in back due to  blisters, amputee disability. Cln provided psychoeducation on chronic family dysfunction and family roles.       Collaboration of Care: Other: Continue seeing providers at [place].    Patient/Guardian was advised Release of Information must be obtained prior to any record release in order to collaborate their care with an outside provider. Patient/Guardian was advised if they have not already done so to contact the registration department to sign all necessary forms in order for us  to release information regarding their care.    Consent: Patient/Guardian gives verbal consent for treatment and assignment of benefits for services provided during this visit. Patient/Guardian expressed understanding and agreed to proceed.     Assessment and Plan: Counselor will continue to meet with patient and address treatment plan goals. Patient was given a chance for recommendations of providers and implement skills learned in session and encouraged to practice coping skills between sessions, practicing between sessions.Diagnosis: anxiety, depression and ptsd  Follow Up Instructions I discussed the assessment and treatment plan with the patient. The patient was provided an opportunity to ask questions and all were answered. The patient agreed with the plan and demonstrated an understanding of the instructions.   The patient was advised to call back or seek an in-person evaluation if the symptoms worsen or if the condition fails to improve as anticipated.  I provided 60 minutes of non-face-to-face time during this encounter.   Zaray Gatchel S, LCAS  .

## 2023-08-26 ENCOUNTER — Ambulatory Visit (HOSPITAL_COMMUNITY): Admitting: Licensed Clinical Social Worker

## 2023-08-26 ENCOUNTER — Encounter (HOSPITAL_COMMUNITY): Payer: Self-pay | Admitting: Licensed Clinical Social Worker

## 2023-08-26 DIAGNOSIS — F419 Anxiety disorder, unspecified: Secondary | ICD-10-CM | POA: Diagnosis not present

## 2023-08-26 DIAGNOSIS — F329 Major depressive disorder, single episode, unspecified: Secondary | ICD-10-CM | POA: Diagnosis not present

## 2023-08-26 DIAGNOSIS — F4310 Post-traumatic stress disorder, unspecified: Secondary | ICD-10-CM | POA: Diagnosis not present

## 2023-08-26 DIAGNOSIS — F331 Major depressive disorder, recurrent, moderate: Secondary | ICD-10-CM

## 2023-08-26 DIAGNOSIS — F411 Generalized anxiety disorder: Secondary | ICD-10-CM

## 2023-08-26 NOTE — Progress Notes (Addendum)
 Virtual Visit via Video Group Note  I connected with Mark [name] on 08/26/23 at 5:00-6:30 pm EST by a video enabled telemedicine application and verified that I am speaking with the correct person using two identifiers.   I discussed the limitations of evaluation and management by telemedicine and the availability of in person appointments. The patient expressed understanding and agreed to proceed.  LOCATION: Patient: Home Provider: home Office  History of Present Illness: [facility], [facility], [facility].  Treatment Goal Addressed:  Pt will develop the ability to recognize, accept, and cope with feelings of anxiety AEB self report.  Progress towards Goal:  Progressing    Observation/Objective:  Pt participated in a group discussion on change in relationships, which may mean letting go of dysfunctional relationship patterns, irrational beliefs and self-sabotaging behaviors and then replacing them with more positive beliefs and relationship patterns. Patient was encouraged to embrace change.     2025 WORD: INTENTIONAL:    Assessment and Plan:  Counselor will continue to meet with patient to address treatment plan goals. Patient will continue to follow recommendations of providers and implement skills learned in session.Diagnosis: MDD, anxiety, PTSD    Follow-up instructions:I discussed the assessment and treatment plan with the patient. Patient was provided an opportunity to ask questions and all were answered. The patient agreed with the plan and demonstrated an understanding of the instructions.   The patient was advised to call back or seek an in-person evaluation if the symptoms worsen or if the condition fails to improve as anticipated.  Collaboration of Care: Other: Continue to see providers at [facility]  Patient/Guardian was advised Release of Information must be obtained prior to any record release in order to collaborate their  care with an outside provider. Patient/Guardian was advised if they have not already done so to contact the registration department to sign all necessary forms in order for us  to release information regarding their care.   Consent: Patient/Guardian gives verbal consent for treatment and assignment of benefits for services provided during this visit. Patient/Guardian expressed understanding and agreed to proceed.      I provided 90 minutes of non-face-to-face time during this encounter.   Hugh Kamara S, LCAS              08/26/23

## 2023-08-28 ENCOUNTER — Ambulatory Visit (INDEPENDENT_AMBULATORY_CARE_PROVIDER_SITE_OTHER): Admitting: Licensed Clinical Social Worker

## 2023-08-28 DIAGNOSIS — F331 Major depressive disorder, recurrent, moderate: Secondary | ICD-10-CM | POA: Diagnosis not present

## 2023-08-28 DIAGNOSIS — F4310 Post-traumatic stress disorder, unspecified: Secondary | ICD-10-CM | POA: Diagnosis not present

## 2023-08-28 DIAGNOSIS — F411 Generalized anxiety disorder: Secondary | ICD-10-CM | POA: Diagnosis not present

## 2023-08-29 ENCOUNTER — Encounter (HOSPITAL_COMMUNITY): Payer: Self-pay | Admitting: Licensed Clinical Social Worker

## 2023-08-29 NOTE — Progress Notes (Signed)
 Virtual Visit via Video Note  I connected with Mark [name] on 08/29/23 at 1-2pm EST by video-enabled telemedicine with the correct person using two identifiers.   I discussed the limitations of evaluation and management by telemedicine and the availability of in person appointments. The patient expressed understanding and agreed to proceed.  LOCATION Patient: home Provider: home office    History of Present Illness: Pt was referred to [facility] OP therapy for PTSD, depression and anxiety by the [facility].  Treatment Goal Addressed:   Pt will reduce irritability and increase normal social interaction with family and friends and will develop the ability to recognize, accept, and cope with feelings of depression and anxiety by using practiced coping skills for depression 1-2x daily AEB self report.  Progress towards Goal:  Progressing    Observations/Objective: Pt presented for today's session depressed, oriented x5, with no evidence or self-report of SI/HI or A/V H.  Patient reported ongoing compliance with medication. Clinician inquired about patient's current emotional ratings, as well as any significant changes in thoughts, feelings or behavior since previous session. Patient reported scores of 8/10 for depression, 8/10 for anxiety, 4/10 for anger/irritability, and denied any panic attacks. Pt reports, My wheelchair is supposed to be delivered within the week. It's almost been 3 months.  Cln asked open ended questions. Pt identified other stressors that occurred in his life, along with coping skills used over the past week:  grounding and breathing exercises:  Stressors: packing up house, family dysfunction, moving back to [place], family, moving back to toxic environment, giving away ESA,  financial issues, chronic pain,  pain in back due to blisters, amputee disability. I'm going on a men's retreat in [place] this weekend. I'm looking forward to going and spending spiritual time with men from the  church. Cln asked open ended questions. Clinician utilized MI OARS to reflect and summarize thoughts and feelings.    Collaboration of Care: Other: Continue seeing providers at [place].    Patient/Guardian was advised Release of Information must be obtained prior to any record release in order to collaborate their care with an outside provider. Patient/Guardian was advised if they have not already done so to contact the registration department to sign all necessary forms in order for us  to release information regarding their care.    Consent: Patient/Guardian gives verbal consent for treatment and assignment of benefits for services provided during this visit. Patient/Guardian expressed understanding and agreed to proceed.     Assessment and Plan: Counselor will continue to meet with patient and address treatment plan goals. Patient was given a chance for recommendations of providers and implement skills learned in session and encouraged to practice coping skills between sessions, practicing between sessions.Diagnosis: anxiety, depression and ptsd  Follow Up Instructions I discussed the assessment and treatment plan with the patient. The patient was provided an opportunity to ask questions and all were answered. The patient agreed with the plan and demonstrated an understanding of the instructions.   The patient was advised to call back or seek an in-person evaluation if the symptoms worsen or if the condition fails to improve as anticipated.  I provided 60 minutes of non-face-to-face time during this encounter.   Nicole Hafley S, LCAS  .

## 2023-09-02 ENCOUNTER — Ambulatory Visit (HOSPITAL_COMMUNITY): Admitting: Licensed Clinical Social Worker

## 2023-09-02 ENCOUNTER — Encounter (HOSPITAL_COMMUNITY): Payer: Self-pay | Admitting: Licensed Clinical Social Worker

## 2023-09-02 DIAGNOSIS — F4310 Post-traumatic stress disorder, unspecified: Secondary | ICD-10-CM | POA: Diagnosis not present

## 2023-09-02 DIAGNOSIS — F329 Major depressive disorder, single episode, unspecified: Secondary | ICD-10-CM | POA: Diagnosis not present

## 2023-09-02 DIAGNOSIS — F419 Anxiety disorder, unspecified: Secondary | ICD-10-CM

## 2023-09-02 DIAGNOSIS — F331 Major depressive disorder, recurrent, moderate: Secondary | ICD-10-CM

## 2023-09-02 DIAGNOSIS — F411 Generalized anxiety disorder: Secondary | ICD-10-CM

## 2023-09-02 NOTE — Progress Notes (Addendum)
 Virtual Visit via Video Group Note  I connected with Mark [name] on 09/02/23 at 5:00-6:30 pm EST by a video enabled telemedicine application and verified that I am speaking with the correct person using two identifiers.   I discussed the limitations of evaluation and management by telemedicine and the availability of in person appointments. The patient expressed understanding and agreed to proceed.  LOCATION: Patient: Home Provider: home Office  History of Present Illness: [facility], [facility], [facility].  Treatment Goal Addressed:  Pt will develop the ability to recognize, accept, and cope with feelings of anxiety AEB self report.  Progress towards Goal:  Progressing    Observation/Objective:   Patient participated in a group discussion on dealing with health issues, including chronic pain. Cln used CBT to assist patient in improving the quality of life, activities of daily living, focusing on reducing stress by modifying physical sensation, catastrophic thinking and maladaptive behaviors. Patient was encouraged to improve quality of life.    2025 WORD: INTENTIONAL:    Assessment and Plan:  Counselor will continue to meet with patient to address treatment plan goals. Patient will continue to follow recommendations of providers and implement skills learned in session. Diagnosis MDD, Anxiety, PTSD    Follow-up instructions:I discussed the assessment and treatment plan with the patient. Patient was provided an opportunity to ask questions and all were answered. The patient agreed with the plan and demonstrated an understanding of the instructions.   The patient was advised to call back or seek an in-person evaluation if the symptoms worsen or if the condition fails to improve as anticipated.  Collaboration of Care: Other: Continue to see providers at [facility]  Patient/Guardian was advised Release of Information must be obtained prior  to any record release in order to collaborate their care with an outside provider. Patient/Guardian was advised if they have not already done so to contact the registration department to sign all necessary forms in order for us  to release information regarding their care.   Consent: Patient/Guardian gives verbal consent for treatment and assignment of benefits for services provided during this visit. Patient/Guardian expressed understanding and agreed to proceed.      I provided 90 minutes of non-face-to-face time during this encounter.   Aggie Douse S, LCAS              09/02/23

## 2023-09-04 ENCOUNTER — Ambulatory Visit (HOSPITAL_COMMUNITY): Admitting: Licensed Clinical Social Worker

## 2023-09-04 DIAGNOSIS — F32A Depression, unspecified: Secondary | ICD-10-CM

## 2023-09-04 DIAGNOSIS — F4310 Post-traumatic stress disorder, unspecified: Secondary | ICD-10-CM

## 2023-09-04 DIAGNOSIS — F411 Generalized anxiety disorder: Secondary | ICD-10-CM

## 2023-09-04 DIAGNOSIS — F331 Major depressive disorder, recurrent, moderate: Secondary | ICD-10-CM

## 2023-09-04 DIAGNOSIS — F419 Anxiety disorder, unspecified: Secondary | ICD-10-CM | POA: Diagnosis not present

## 2023-09-09 ENCOUNTER — Encounter (HOSPITAL_COMMUNITY): Payer: Self-pay | Admitting: Licensed Clinical Social Worker

## 2023-09-09 ENCOUNTER — Ambulatory Visit (HOSPITAL_COMMUNITY): Admitting: Licensed Clinical Social Worker

## 2023-09-09 ENCOUNTER — Ambulatory Visit (INDEPENDENT_AMBULATORY_CARE_PROVIDER_SITE_OTHER): Admitting: Licensed Clinical Social Worker

## 2023-09-09 DIAGNOSIS — F329 Major depressive disorder, single episode, unspecified: Secondary | ICD-10-CM

## 2023-09-09 DIAGNOSIS — F331 Major depressive disorder, recurrent, moderate: Secondary | ICD-10-CM

## 2023-09-09 DIAGNOSIS — F4310 Post-traumatic stress disorder, unspecified: Secondary | ICD-10-CM | POA: Diagnosis not present

## 2023-09-09 DIAGNOSIS — F411 Generalized anxiety disorder: Secondary | ICD-10-CM | POA: Diagnosis not present

## 2023-09-09 NOTE — Progress Notes (Addendum)
 Virtual Visit via Video Group Note  I connected with Mark [name] on 09/09/23 at 5:00-6:30 pm EST by a video enabled telemedicine application and verified that I am speaking with the correct person using two identifiers.   I discussed the limitations of evaluation and management by telemedicine and the availability of in person appointments. The patient expressed understanding and agreed to proceed.  LOCATION: Patient: Home Provider: home Office  History of Present Illness: [facility], depression, [facility].  Treatment Goal Addressed:  Pt will develop the ability to recognize, accept, and cope with feelings of anxiety AEB self report.  Progress towards Goal:  Progressing    Observation/Objective:    Patient participated in a group discussion on overcoming life's challenges, under stress, which makes it more difficult to overcome obstacles in life. Patient was encouraged to identify life challenges and explore strategies for overcoming obstacles.        2025 WORD: INTENTIONAL:    Assessment and Plan:  Counselor will continue to meet with patient to address treatment plan goals. Patient will continue to follow recommendations of providers and implement skills learned in session. Diagnosis: MDD, Anxiety, PTSD    Follow-up instructions:I discussed the assessment and treatment plan with the patient. Patient was provided an opportunity to ask questions and all were answered. The patient agreed with the plan and demonstrated an understanding of the instructions.   The patient was advised to call back or seek an in-person evaluation if the symptoms worsen or if the condition fails to improve as anticipated.  Collaboration of Care: Other: Continue to see providers at [facility]  Patient/Guardian was advised Release of Information must be obtained prior to any record release in order to collaborate their care with an outside provider.  Patient/Guardian was advised if they have not already done so to contact the registration department to sign all necessary forms in order for us  to release information regarding their care.   Consent: Patient/Guardian gives verbal consent for treatment and assignment of benefits for services provided during this visit. Patient/Guardian expressed understanding and agreed to proceed.      I provided 90 minutes of non-face-to-face time during this encounter.   Geryl Dohn S, LCAS              09/09/23

## 2023-09-09 NOTE — Progress Notes (Signed)
 Virtual Visit via Video Note  I connected with Mark [name] on 09/04/23 at 1-2pm EST by video-enabled telemedicine with the correct person using two identifiers.   I discussed the limitations of evaluation and management by telemedicine and the availability of in person appointments. The patient expressed understanding and agreed to proceed.  LOCATION Patient: home Provider: home office    History of Present Illness: Pt was referred to [facility] OP therapy for PTSD, depression and anxiety by the [facility].  Treatment Goal Addressed:   Pt will reduce irritability and increase normal social interaction with family and friends and will develop the ability to recognize, accept, and cope with feelings of depression and anxiety by using practiced coping skills for depression 1-2x daily AEB self report.  Progress towards Goal:  Progressing    Observations/Objective: Pt presented for today's session depressed, oriented x5, with no evidence or self-report of SI/HI or A/V H.  Patient reported ongoing compliance with medication. Clinician inquired about patient's current emotional ratings, as well as any significant changes in thoughts, feelings or behavior since previous session. Patient reported scores of 8/10 for depression, 8/10 for anxiety, 4/10 for anger/irritability, and denied any panic attacks. Pt reports, My wheelchair was not delivered. It's almost been 3 months. I am frustrated.  Cln asked open ended questions. Pt identified other stressors that occurred in his life over the past week, along with coping skills.:  grounding and breathing exercises:  Stressors: packing up house, family dysfunction, moving back to [place], family, moving back to toxic environment, giving away ESA,  financial issues, chronic pain,  pain in back due to blisters, amputee disability. I'm just returning from a men's retreat in [place] this past weekend. I enjoyed spending spiritual time with men from the church. Cln asked open  ended questions. Clinician processed thoughts, feelings, and behaviors.       Collaboration of Care: Other: Continue seeing providers at [place].    Patient/Guardian was advised Release of Information must be obtained prior to any record release in order to collaborate their care with an outside provider. Patient/Guardian was advised if they have not already done so to contact the registration department to sign all necessary forms in order for us  to release information regarding their care.    Consent: Patient/Guardian gives verbal consent for treatment and assignment of benefits for services provided during this visit. Patient/Guardian expressed understanding and agreed to proceed.     Assessment and Plan: Counselor will continue to meet with patient and address treatment plan goals. Patient was given a chance for recommendations of providers and implement skills learned in session and encouraged to practice coping skills between sessions, practicing between sessions.Diagnosis: anxiety, depression and ptsd  Follow Up Instructions I discussed the assessment and treatment plan with the patient. The patient was provided an opportunity to ask questions and all were answered. The patient agreed with the plan and demonstrated an understanding of the instructions.   The patient was advised to call back or seek an in-person evaluation if the symptoms worsen or if the condition fails to improve as anticipated.  I provided 60 minutes of non-face-to-face time during this encounter.   [name], LCAS  .

## 2023-09-11 ENCOUNTER — Ambulatory Visit (INDEPENDENT_AMBULATORY_CARE_PROVIDER_SITE_OTHER): Admitting: Licensed Clinical Social Worker

## 2023-09-11 DIAGNOSIS — F411 Generalized anxiety disorder: Secondary | ICD-10-CM | POA: Diagnosis not present

## 2023-09-11 DIAGNOSIS — F331 Major depressive disorder, recurrent, moderate: Secondary | ICD-10-CM | POA: Diagnosis not present

## 2023-09-11 DIAGNOSIS — F4310 Post-traumatic stress disorder, unspecified: Secondary | ICD-10-CM

## 2023-09-15 ENCOUNTER — Encounter (HOSPITAL_COMMUNITY): Payer: Self-pay | Admitting: Licensed Clinical Social Worker

## 2023-09-15 NOTE — Progress Notes (Signed)
 Virtual Visit via Video Note  I connected with Mark [name] on 09/11/23 at 1-2pm EST by video-enabled telemedicine with the correct person using two identifiers.   I discussed the limitations of evaluation and management by telemedicine and the availability of in person appointments. The patient expressed understanding and agreed to proceed.  LOCATION Patient: home Provider: home office    History of Present Illness: Pt was referred to [facility] OP therapy for PTSD, depression and anxiety by the [facility].  Treatment Goal Addressed:   Pt will reduce irritability and increase normal social interaction with family and friends and will develop the ability to recognize, accept, and cope with feelings of depression and anxiety by using practiced coping skills for depression 1-2x daily AEB self report.  Progress towards Goal:  Progressing    Observations/Objective: Pt presented for today's session depressed, oriented x5, with no evidence or self-report of SI/HI or A/V H.  Patient reported ongoing compliance with medication. Clinician inquired about patient's current emotional ratings, as well as any significant changes in thoughts, feelings or behavior since previous session. Patient reported scores of 8/10 for depression, 8/10 for anxiety, 4/10 for anger/irritability, and denied any panic attacks. Pt reports, My wheelchair is going to be delivered this afternoon. It's almost been 3 months.  Cln asked open ended questions. Pt identified other stressors that occurred in his life over the past week, along with coping skills.:  grounding and breathing exercises:  Stressors: packing up house, family dysfunction, moving back to [place], family, moving back to toxic environment, giving away ESA,  financial issues, chronic pain,  pain in back due to blisters, amputee disability. I'm experiencing a lot of pain in my stump Cln suggested make an appt with PCP at [place]. I'm going to visit my family next week, I'm  sure it will be stressful. Cln asked open ended questions and role played possible scenarios while visiting family.       Collaboration of Care: Other: Continue seeing providers at [place].    Patient/Guardian was advised Release of Information must be obtained prior to any record release in order to collaborate their care with an outside provider. Patient/Guardian was advised if they have not already done so to contact the registration department to sign all necessary forms in order for us  to release information regarding their care.    Consent: Patient/Guardian gives verbal consent for treatment and assignment of benefits for services provided during this visit. Patient/Guardian expressed understanding and agreed to proceed.     Assessment and Plan: Counselor will continue to meet with patient and address treatment plan goals. Patient was given a chance for recommendations of providers and implement skills learned in session and encouraged to practice coping skills between sessions, practicing between sessions.Diagnosis: anxiety, depression and ptsd  Follow Up Instructions I discussed the assessment and treatment plan with the patient. The patient was provided an opportunity to ask questions and all were answered. The patient agreed with the plan and demonstrated an understanding of the instructions.   The patient was advised to call back or seek an in-person evaluation if the symptoms worsen or if the condition fails to improve as anticipated.  I provided 60 minutes of non-face-to-face time during this encounter.   Mark [name] S, LCAS  .

## 2023-09-16 ENCOUNTER — Ambulatory Visit (HOSPITAL_COMMUNITY): Admitting: Licensed Clinical Social Worker

## 2023-09-16 ENCOUNTER — Encounter (HOSPITAL_COMMUNITY): Payer: Self-pay | Admitting: Licensed Clinical Social Worker

## 2023-09-16 DIAGNOSIS — F4310 Post-traumatic stress disorder, unspecified: Secondary | ICD-10-CM

## 2023-09-16 DIAGNOSIS — F411 Generalized anxiety disorder: Secondary | ICD-10-CM | POA: Diagnosis not present

## 2023-09-16 DIAGNOSIS — F329 Major depressive disorder, single episode, unspecified: Secondary | ICD-10-CM | POA: Diagnosis not present

## 2023-09-16 DIAGNOSIS — F331 Major depressive disorder, recurrent, moderate: Secondary | ICD-10-CM

## 2023-09-16 NOTE — Progress Notes (Addendum)
 Virtual Visit via Video Group Note  I connected with Mark [name] on 09/16/23 at 5:00-6:30 pm EST by a video enabled telemedicine application and verified that I am speaking with the correct person using two identifiers.   I discussed the limitations of evaluation and management by telemedicine and the availability of in person appointments. The patient expressed understanding and agreed to proceed.  LOCATION: Patient: Home Provider: home Office  History of Present Illness: [facility].  Treatment Goal Addressed:  Pt will develop the ability to recognize, accept, and cope with feelings of anxiety AEB self report.  Progress towards Goal:  Progressing    Observation/Objective:    Patient participated in a group discussion on feeling emotionally overwhelmed. Patient was encouraged to use coping skills, soothing activities and grounding to help with self-soothing while feeling emotionally overwhelmed. Patient was encouraged to use the self-soothing activities at home between sessions when feeling overwhelmed emotionally.      2025 WORD: INTENTIONAL:    Assessment and Plan:  Counselor will continue to meet with patient to address treatment plan goals. Patient will continue to follow recommendations of providers and implement skills learned in session. MDD, Anxiety, PTSD    Follow-up instructions:I discussed the assessment and treatment plan with the patient. Patient was provided an opportunity to ask questions and all were answered. The patient agreed with the plan and demonstrated an understanding of the instructions.   The patient was advised to call back or seek an in-person evaluation if the symptoms worsen or if the condition fails to improve as anticipated.  Collaboration of Care: Other: Continue to see providers at [facility]  Patient/Guardian was advised Release of Information must be obtained prior to any  record release in order to collaborate their care with an outside provider. Patient/Guardian was advised if they have not already done so to contact the registration department to sign all necessary forms in order for us  to release information regarding their care.   Consent: Patient/Guardian gives verbal consent for treatment and assignment of benefits for services provided during this visit. Patient/Guardian expressed understanding and agreed to proceed.      I provided 90 minutes of non-face-to-face time during this encounter.   Starleen Trussell S, LCAS              09/16/23

## 2023-09-18 ENCOUNTER — Ambulatory Visit (INDEPENDENT_AMBULATORY_CARE_PROVIDER_SITE_OTHER): Admitting: Licensed Clinical Social Worker

## 2023-09-18 DIAGNOSIS — F411 Generalized anxiety disorder: Secondary | ICD-10-CM | POA: Diagnosis not present

## 2023-09-18 DIAGNOSIS — F331 Major depressive disorder, recurrent, moderate: Secondary | ICD-10-CM | POA: Diagnosis not present

## 2023-09-18 DIAGNOSIS — F4310 Post-traumatic stress disorder, unspecified: Secondary | ICD-10-CM | POA: Diagnosis not present

## 2023-09-19 ENCOUNTER — Encounter (HOSPITAL_COMMUNITY): Payer: Self-pay | Admitting: Licensed Clinical Social Worker

## 2023-09-19 NOTE — Progress Notes (Signed)
 Virtual Visit via Video Note  I connected with Mark [name] on 09/18/23 at 1-2pm EST by video-enabled telemedicine with the correct person using two identifiers.   I discussed the limitations of evaluation and management by telemedicine and the availability of in person appointments. The patient expressed understanding and agreed to proceed.  LOCATION Patient: home Provider: home office    History of Present Illness: Pt was referred to [facility] OP therapy for PTSD, depression and anxiety by the [facility].  Treatment Goal Addressed:   Pt will reduce irritability and increase normal social interaction with family and friends and will develop the ability to recognize, accept, and cope with feelings of depression and anxiety by using practiced coping skills for depression 1-2x daily AEB self report.  Progress towards Goal:  Progressing    Observations/Objective: Pt presented for today's session depressed, oriented x5, with no evidence or self-report of SI/HI or A/V H.  Patient reported ongoing compliance with medication. Clinician inquired about patient's current emotional ratings, as well as any significant changes in thoughts, feelings or behavior since previous session. Patient reported scores of 8/10 for depression, 8/10 for anxiety, 4/10 for anger/irritability, and denied any panic attacks. Pt reports, My wheelchair was delivered and it didn't improve my moods. I'm depressed..  Cln asked open ended questions. Pt identified other stressors that occurred in his life over the past week, along with coping skills.:  grounding and breathing exercises:  Stressors: packing up house, family dysfunction, moving back to [place], family, moving back to toxic environment, giving away ESA,  financial issues, chronic pain,  pain in back due to blisters, amputee disability. I'm continuing to have a lot of pain in my stump. I've seen and dr and had tests done and they have not determined the cause of the pain.   I'm going to visit my family tomorrow. I'm sure it will be stressful. Cln asked open ended questions and again,role played possible scenarios while visiting family.       Collaboration of Care: Other: Continue seeing providers at [place].    Patient/Guardian was advised Release of Information must be obtained prior to any record release in order to collaborate their care with an outside provider. Patient/Guardian was advised if they have not already done so to contact the registration department to sign all necessary forms in order for us  to release information regarding their care.    Consent: Patient/Guardian gives verbal consent for treatment and assignment of benefits for services provided during this visit. Patient/Guardian expressed understanding and agreed to proceed.     Assessment and Plan: Counselor will continue to meet with patient and address treatment plan goals. Patient was given a chance for recommendations of providers and implement skills learned in session and encouraged to practice coping skills between sessions, practicing between sessions.Diagnosis: anxiety, depression and ptsd  Follow Up Instructions I discussed the assessment and treatment plan with the patient. The patient was provided an opportunity to ask questions and all were answered. The patient agreed with the plan and demonstrated an understanding of the instructions.   The patient was advised to call back or seek an in-person evaluation if the symptoms worsen or if the condition fails to improve as anticipated.  I provided 60 minutes of non-face-to-face time during this encounter.   Brooke Steinhilber S, LCAS  .

## 2023-09-23 ENCOUNTER — Ambulatory Visit (HOSPITAL_COMMUNITY): Admitting: Licensed Clinical Social Worker

## 2023-09-25 ENCOUNTER — Encounter (HOSPITAL_COMMUNITY): Payer: Self-pay | Admitting: Licensed Clinical Social Worker

## 2023-09-25 ENCOUNTER — Ambulatory Visit (INDEPENDENT_AMBULATORY_CARE_PROVIDER_SITE_OTHER): Admitting: Licensed Clinical Social Worker

## 2023-09-25 DIAGNOSIS — F4310 Post-traumatic stress disorder, unspecified: Secondary | ICD-10-CM

## 2023-09-25 DIAGNOSIS — F411 Generalized anxiety disorder: Secondary | ICD-10-CM

## 2023-09-25 DIAGNOSIS — F331 Major depressive disorder, recurrent, moderate: Secondary | ICD-10-CM

## 2023-09-25 NOTE — Progress Notes (Signed)
 Virtual Visit via Video Note  I connected with Mark [name] on 09/25/23 at 1-2pm EST by video-enabled telemedicine with the correct person using two identifiers.   I discussed the limitations of evaluation and management by telemedicine and the availability of in person appointments. The patient expressed understanding and agreed to proceed.  LOCATION Patient: home Provider: home office    History of Present Illness: Pt was referred to [facility] OP therapy for PTSD, depression and anxiety by the [facility].  Treatment Goal Addressed:   Pt will reduce irritability and increase normal social interaction with family and friends and will develop the ability to recognize, accept, and cope with feelings of depression and anxiety by using practiced coping skills for depression 1-2x daily AEB self report.  Progress towards Goal:  Progressing    Observations/Objective: Pt presented for today's session depressed, oriented x5, with no evidence or self-report of SI/HI or A/V H.  Patient reported ongoing compliance with medication. Clinician inquired about patient's current emotional ratings, as well as any significant changes in thoughts, feelings or behavior since previous session. Patient reported scores of 8/10 for depression, 8/10 for anxiety, 4/10 for anger/irritability, and denied any panic attacks. Pt reports, I'm visiting family this week. Clln asked open ended questions. Pt identified other stressors that occurred in his life over the past week, along with coping skills.:  grounding and breathing exercises:  Stressors: packing up house, family dysfunction, moving back to [place], family, moving back to toxic environment, giving away ESA,  financial issues, chronic pain,  pain in back due to blisters, amputee disability. I'm continuing to have a lot of pain in my stump. I've seen and dr and had tests done and they have not determined the cause of the pain, but will order a CT scan. I'm visiting my family  and its been stressful, but not as bad as I thought it would be. Cln used CBT to assist pt in dealing with his new stress which has increased his depression and anxiety and discussed coping skills.        Collaboration of Care: Other: Continue seeing providers at [place].    Patient/Guardian was advised Release of Information must be obtained prior to any record release in order to collaborate their care with an outside provider. Patient/Guardian was advised if they have not already done so to contact the registration department to sign all necessary forms in order for us  to release information regarding their care.    Consent: Patient/Guardian gives verbal consent for treatment and assignment of benefits for services provided during this visit. Patient/Guardian expressed understanding and agreed to proceed.     Assessment and Plan: Counselor will continue to meet with patient and address treatment plan goals. Patient was given a chance for recommendations of providers and implement skills learned in session and encouraged to practice coping skills between sessions, practicing between sessions.Diagnosis: anxiety, depression and ptsd  Follow Up Instructions I discussed the assessment and treatment plan with the patient. The patient was provided an opportunity to ask questions and all were answered. The patient agreed with the plan and demonstrated an understanding of the instructions.   The patient was advised to call back or seek an in-person evaluation if the symptoms worsen or if the condition fails to improve as anticipated.  I provided 60 minutes of non-face-to-face time during this encounter.   Jazmine Heckman S, LCAS  .

## 2023-09-30 ENCOUNTER — Ambulatory Visit (HOSPITAL_COMMUNITY): Admitting: Licensed Clinical Social Worker

## 2023-09-30 ENCOUNTER — Encounter (HOSPITAL_COMMUNITY): Payer: Self-pay | Admitting: Licensed Clinical Social Worker

## 2023-09-30 DIAGNOSIS — F4310 Post-traumatic stress disorder, unspecified: Secondary | ICD-10-CM | POA: Diagnosis not present

## 2023-09-30 DIAGNOSIS — F411 Generalized anxiety disorder: Secondary | ICD-10-CM

## 2023-09-30 DIAGNOSIS — F419 Anxiety disorder, unspecified: Secondary | ICD-10-CM | POA: Diagnosis not present

## 2023-09-30 DIAGNOSIS — F32A Depression, unspecified: Secondary | ICD-10-CM

## 2023-09-30 DIAGNOSIS — F331 Major depressive disorder, recurrent, moderate: Secondary | ICD-10-CM

## 2023-09-30 NOTE — Progress Notes (Signed)
 Virtual Visit via Video Group Note  I connected with Mark [name] on 09/30/23 at 5:00-6:30 pm EST by a video enabled telemedicine application and verified that I am speaking with the correct person using two identifiers.   I discussed the limitations of evaluation and management by telemedicine and the availability of in person appointments. The patient expressed understanding and agreed to proceed.  LOCATION: Patient: Home Provider: home Office  History of Present Illness: [facility], [facility], [facility].  Treatment Goal Addressed:  Pt will develop the ability to recognize, accept, and cope with feelings of anxiety AEB self report.  Progress towards Goal:  Progressing    Observation/Objective:   Pt participated in a group discussion on family dysfunction. Clinician assessed patient's family dynamics and discussed how the family dynamics influence mental health symptoms in a negative way. Patient was encouraged to work to improve family function.     2025 WORD: INTENTIONAL:    Assessment and Plan:  Counselor will continue to meet with patient to address treatment plan goals. Patient will continue to follow recommendations of providers and implement skills learned in session.    Follow-up instructions:I discussed the assessment and treatment plan with the patient. Patient was provided an opportunity to ask questions and all were answered. The patient agreed with the plan and demonstrated an understanding of the instructions.   The patient was advised to call back or seek an in-person evaluation if the symptoms worsen or if the condition fails to improve as anticipated.  Collaboration of Care: Other: Continue to see providers at [facility]  Patient/Guardian was advised Release of Information must be obtained prior to any record release in order to collaborate their care with an outside provider. Patient/Guardian was advised if they have  not already done so to contact the registration department to sign all necessary forms in order for us  to release information regarding their care.   Consent: Patient/Guardian gives verbal consent for treatment and assignment of benefits for services provided during this visit. Patient/Guardian expressed understanding and agreed to proceed.      I provided 90 minutes of non-face-to-face time during this encounter.   Owen Pagnotta S, LCAS              09/30/23

## 2023-10-02 ENCOUNTER — Ambulatory Visit (HOSPITAL_COMMUNITY): Admitting: Licensed Clinical Social Worker

## 2023-10-02 DIAGNOSIS — F4310 Post-traumatic stress disorder, unspecified: Secondary | ICD-10-CM

## 2023-10-02 DIAGNOSIS — F411 Generalized anxiety disorder: Secondary | ICD-10-CM

## 2023-10-02 DIAGNOSIS — F331 Major depressive disorder, recurrent, moderate: Secondary | ICD-10-CM

## 2023-10-06 ENCOUNTER — Encounter (HOSPITAL_COMMUNITY): Payer: Self-pay | Admitting: Licensed Clinical Social Worker

## 2023-10-06 NOTE — Progress Notes (Signed)
 Virtual Visit via Video Note  I connected with Mark [name] on 10/02/23 at 1:30-2pm EST by video-enabled telemedicine with the correct person using two identifiers.   I discussed the limitations of evaluation and management by telemedicine and the availability of in person appointments. The patient expressed understanding and agreed to proceed.  LOCATION Patient: home Provider: home office    History of Present Illness: Pt was referred to [facility] OP therapy for PTSD, depression and anxiety by the [facility].  Treatment Goal Addressed:   Pt will reduce irritability and increase normal social interaction with family and friends and will develop the ability to recognize, accept, and cope with feelings of depression and anxiety by using practiced coping skills for depression 1-2x daily AEB self report.  Progress towards Goal:  Progressing    Observations/Objective: Pt presented for today's session anxious, oriented x5, with no evidence or self-report of SI/HI or A/V H.  Patient reported ongoing compliance with medication. Clinician inquired about patient's current emotional ratings, as well as any significant changes in thoughts, feelings or behavior since previous session. Patient reported scores of 8/10 for depression, 8/10 for anxiety, 4/10 for anger/irritability, and denied any panic attacks. Pt reports, I'm home from visiting family. Clln asked open ended questions. Pt identified other stressors that occurred in his life over the past week, along with coping skills.:  grounding and breathing exercises:  Stressors: packing up house, family dysfunction, moving back to [place], family, moving back to toxic environment, giving away ESA,  financial issues, chronic pain,  pain in back due to blisters, amputee disability.  Again, Cln used CBT to assist pt in dealing with his new stress which has increased his depression and anxiety and discussed coping skills.        Collaboration of Care: Other:  Continue seeing providers at [place].    Patient/Guardian was advised Release of Information must be obtained prior to any record release in order to collaborate their care with an outside provider. Patient/Guardian was advised if they have not already done so to contact the registration department to sign all necessary forms in order for us  to release information regarding their care.    Consent: Patient/Guardian gives verbal consent for treatment and assignment of benefits for services provided during this visit. Patient/Guardian expressed understanding and agreed to proceed.     Assessment and Plan: Counselor will continue to meet with patient and address treatment plan goals. Patient was given a chance for recommendations of providers and implement skills learned in session and encouraged to practice coping skills between sessions, practicing between sessions.Diagnosis: anxiety, depression and ptsd  Follow Up Instructions I discussed the assessment and treatment plan with the patient. The patient was provided an opportunity to ask questions and all were answered. The patient agreed with the plan and demonstrated an understanding of the instructions.   The patient was advised to call back or seek an in-person evaluation if the symptoms worsen or if the condition fails to improve as anticipated.  I provided 60 minutes of non-face-to-face time during this encounter.   Jearlene Bridwell S, LCAS  .

## 2023-10-07 ENCOUNTER — Ambulatory Visit (HOSPITAL_COMMUNITY): Admitting: Licensed Clinical Social Worker

## 2023-10-07 ENCOUNTER — Encounter (HOSPITAL_COMMUNITY): Payer: Self-pay | Admitting: Licensed Clinical Social Worker

## 2023-10-07 DIAGNOSIS — F419 Anxiety disorder, unspecified: Secondary | ICD-10-CM | POA: Diagnosis not present

## 2023-10-07 DIAGNOSIS — F411 Generalized anxiety disorder: Secondary | ICD-10-CM

## 2023-10-07 DIAGNOSIS — F4310 Post-traumatic stress disorder, unspecified: Secondary | ICD-10-CM

## 2023-10-07 DIAGNOSIS — F329 Major depressive disorder, single episode, unspecified: Secondary | ICD-10-CM | POA: Diagnosis not present

## 2023-10-07 DIAGNOSIS — F331 Major depressive disorder, recurrent, moderate: Secondary | ICD-10-CM

## 2023-10-07 NOTE — Progress Notes (Addendum)
 Virtual Visit via Video Group Note  I connected with Mark [name] on 10/07/23 at 5:00-6:30 pm EST by a video enabled telemedicine application and verified that I am speaking with the correct person using two identifiers.   I discussed the limitations of evaluation and management by telemedicine and the availability of in person appointments. The patient expressed understanding and agreed to proceed.  LOCATION: Patient: Home Provider: home Office  History of Present Illness: [facility], [facility], [facility].  Treatment Goal Addressed:  Pt will develop the ability to recognize, accept, and cope with feelings of anxiety AEB self report.  Progress towards Goal:  Progressing    Observation/Objective:   .Patient participated in a discussion on dealing with health issues, including chronic pain. Cln used CBT to assist patient in improving the quality of life, activities of daily living, focusing on reducing stress by modifying physical sensation, catastrophic thinking and maladaptive behaviors. Patient was encouraged to improve quality of life.      2025 WORD: INTENTIONAL:    Assessment and Plan:  Counselor will continue to meet with patient to address treatment plan goals. Patient will continue to follow recommendations of providers and implement skills learned in session. Diagnosis: MDD, Anxiety, PTSD    Follow-up instructions:I discussed the assessment and treatment plan with the patient. Patient was provided an opportunity to ask questions and all were answered. The patient agreed with the plan and demonstrated an understanding of the instructions.   The patient was advised to call back or seek an in-person evaluation if the symptoms worsen or if the condition fails to improve as anticipated.  Collaboration of Care: Other: Continue to see providers at [facility]  Patient/Guardian was advised Release of Information must be obtained prior  to any record release in order to collaborate their care with an outside provider. Patient/Guardian was advised if they have not already done so to contact the registration department to sign all necessary forms in order for us  to release information regarding their care.   Consent: Patient/Guardian gives verbal consent for treatment and assignment of benefits for services provided during this visit. Patient/Guardian expressed understanding and agreed to proceed.      I provided 90 minutes of non-face-to-face time during this encounter.   Griselda Bramblett S, LCAS              10/07/23

## 2023-10-09 ENCOUNTER — Ambulatory Visit (HOSPITAL_COMMUNITY): Admitting: Licensed Clinical Social Worker

## 2023-10-10 ENCOUNTER — Ambulatory Visit (HOSPITAL_COMMUNITY): Admitting: Licensed Clinical Social Worker

## 2023-10-10 DIAGNOSIS — F32A Depression, unspecified: Secondary | ICD-10-CM | POA: Diagnosis not present

## 2023-10-10 DIAGNOSIS — F419 Anxiety disorder, unspecified: Secondary | ICD-10-CM

## 2023-10-10 DIAGNOSIS — F411 Generalized anxiety disorder: Secondary | ICD-10-CM

## 2023-10-10 DIAGNOSIS — F331 Major depressive disorder, recurrent, moderate: Secondary | ICD-10-CM

## 2023-10-10 DIAGNOSIS — F4310 Post-traumatic stress disorder, unspecified: Secondary | ICD-10-CM | POA: Diagnosis not present

## 2023-10-14 ENCOUNTER — Ambulatory Visit (HOSPITAL_COMMUNITY): Admitting: Licensed Clinical Social Worker

## 2023-10-14 ENCOUNTER — Encounter (HOSPITAL_COMMUNITY): Payer: Self-pay

## 2023-10-16 ENCOUNTER — Ambulatory Visit (INDEPENDENT_AMBULATORY_CARE_PROVIDER_SITE_OTHER): Admitting: Licensed Clinical Social Worker

## 2023-10-16 ENCOUNTER — Encounter (HOSPITAL_COMMUNITY): Payer: Self-pay | Admitting: Licensed Clinical Social Worker

## 2023-10-16 DIAGNOSIS — F32A Depression, unspecified: Secondary | ICD-10-CM

## 2023-10-16 DIAGNOSIS — F4310 Post-traumatic stress disorder, unspecified: Secondary | ICD-10-CM | POA: Diagnosis not present

## 2023-10-16 DIAGNOSIS — F331 Major depressive disorder, recurrent, moderate: Secondary | ICD-10-CM

## 2023-10-16 DIAGNOSIS — F419 Anxiety disorder, unspecified: Secondary | ICD-10-CM | POA: Diagnosis not present

## 2023-10-16 DIAGNOSIS — F411 Generalized anxiety disorder: Secondary | ICD-10-CM

## 2023-10-16 NOTE — Progress Notes (Signed)
 Virtual Visit via Video Note  I connected with Mark [name] on 10/10/23 at 1-2pm EST by video-enabled telemedicine with the correct person using two identifiers.   I discussed the limitations of evaluation and management by telemedicine and the availability of in person appointments. The patient expressed understanding and agreed to proceed.  LOCATION Patient: home Provider: home office    History of Present Illness: Pt was referred to [facility] OP therapy for PTSD, depression and anxiety by the [facility].  Treatment Goal Addressed:   Pt will reduce irritability and increase normal social interaction with family and friends and will develop the ability to recognize, accept, and cope with feelings of depression and anxiety by using practiced coping skills for depression 1-2x daily AEB self report.  Progress towards Goal:  Progressing    Observations/Objective: Pt presented for today's session anxious, oriented x5, with no evidence or self-report of SI/HI or A/V H.  Patient reported ongoing compliance with medication. Clinician inquired about patient's current emotional ratings, as well as any significant changes in thoughts, feelings or behavior since previous session. Patient reported scores of 8/10 for depression, 8/10 for anxiety, 4/10 for anger/irritability, and denied any panic attacks. Pt identified other stressors that occurred in his life over the past week, along with coping skills.:  grounding and breathing exercises. Stressors: packing up house, family dysfunction, moving back to [place], family, moving back to toxic environment, giving away ESA,  financial issues, chronic pain,  pain in back due to blisters, amputee disability.  Clinician utilized MI OARS to reflect and summarize thoughts and feelings about this new normal life he is experiencing. Clinician discussed the importance of focusing on the here and now, what he can control.        Collaboration of Care: Other: Continue  seeing providers at [facility].    Patient/Guardian was advised Release of Information must be obtained prior to any record release in order to collaborate their care with an outside provider. Patient/Guardian was advised if they have not already done so to contact the registration department to sign all necessary forms in order for us  to release information regarding their care.    Consent: Patient/Guardian gives verbal consent for treatment and assignment of benefits for services provided during this visit. Patient/Guardian expressed understanding and agreed to proceed.     Assessment and Plan: Counselor will continue to meet with patient and address treatment plan goals. Patient was given a chance for recommendations of providers and implement skills learned in session and encouraged to practice coping skills between sessions, practicing between sessions.Diagnosis: anxiety, depression and ptsd  Follow Up Instructions I discussed the assessment and treatment plan with the patient. The patient was provided an opportunity to ask questions and all were answered. The patient agreed with the plan and demonstrated an understanding of the instructions.   The patient was advised to call back or seek an in-person evaluation if the symptoms worsen or if the condition fails to improve as anticipated.  I provided 60 minutes of non-face-to-face time during this encounter.   Shavanna Furnari S, LCAS  .

## 2023-10-19 ENCOUNTER — Encounter (HOSPITAL_COMMUNITY): Payer: Self-pay | Admitting: Licensed Clinical Social Worker

## 2023-10-19 NOTE — Progress Notes (Signed)
 Virtual Visit via Video Note  I connected with Mark [name] on 10/16/23 at 1-2pm EST by video-enabled telemedicine with the correct person using two identifiers.   I discussed the limitations of evaluation and management by telemedicine and the availability of in person appointments. The patient expressed understanding and agreed to proceed.  LOCATION Patient: home Provider: home office    History of Present Illness: Pt was referred to [facility] OP therapy for PTSD, depression and anxiety by the [facility].  Treatment Goal Addressed:   Pt will reduce irritability and increase normal social interaction with family and friends and will develop the ability to recognize, accept, and cope with feelings of depression and anxiety by using practiced coping skills for depression 1-2x daily AEB self report.  Progress towards Goal:  Progressing    Observations/Objective: Pt presented for today's session anxious, oriented x5, with no evidence or self-report of SI/HI or A/V H.  Patient reported ongoing compliance with medication. Clinician inquired about patient's current emotional ratings, as well as any significant changes in thoughts, feelings or behavior since previous session. Patient reported scores of 9/10 for depression, 8/10 for anxiety, 4/10 for anger/irritability, and denied any panic attacks. Pt identified other stressors that occurred in his life over the past week, along with coping skills.:  grounding and breathing exercises. Stressors: packing up house, family dysfunction, moving back to [place], family, moving back to toxic environment, giving away ESA,  financial issues, chronic pain,  pain in back due to blisters, amputee disability, lack of family support.  My depression has increased. My wheelchair has broken again and thy brought the wrong parts to repair it. Now they have to order more parts. I can't go anywhere again, can't go to church Sunday.  Cln asked open ended questions and used CBT  to assist pt in working though his depressive symptoms and coping skills used.       Collaboration of Care: Other: Continue seeing providers at [facility].    Patient/Guardian was advised Release of Information must be obtained prior to any record release in order to collaborate their care with an outside provider. Patient/Guardian was advised if they have not already done so to contact the registration department to sign all necessary forms in order for us  to release information regarding their care.    Consent: Patient/Guardian gives verbal consent for treatment and assignment of benefits for services provided during this visit. Patient/Guardian expressed understanding and agreed to proceed.     Assessment and Plan: Counselor will continue to meet with patient and address treatment plan goals. Patient was given a chance for recommendations of providers and implement skills learned in session and encouraged to practice coping skills between sessions, practicing between sessions.Diagnosis: anxiety, depression and ptsd  Follow Up Instructions I discussed the assessment and treatment plan with the patient. The patient was provided an opportunity to ask questions and all were answered. The patient agreed with the plan and demonstrated an understanding of the instructions.   The patient was advised to call back or seek an in-person evaluation if the symptoms worsen or if the condition fails to improve as anticipated.  I provided 60 minutes of non-face-to-face time during this encounter.   Meital Riehl S, LCAS  .

## 2023-10-21 ENCOUNTER — Encounter (HOSPITAL_COMMUNITY): Payer: Self-pay | Admitting: Licensed Clinical Social Worker

## 2023-10-21 ENCOUNTER — Ambulatory Visit (HOSPITAL_COMMUNITY): Admitting: Licensed Clinical Social Worker

## 2023-10-21 DIAGNOSIS — F329 Major depressive disorder, single episode, unspecified: Secondary | ICD-10-CM | POA: Diagnosis not present

## 2023-10-21 DIAGNOSIS — F4310 Post-traumatic stress disorder, unspecified: Secondary | ICD-10-CM

## 2023-10-21 DIAGNOSIS — F331 Major depressive disorder, recurrent, moderate: Secondary | ICD-10-CM

## 2023-10-21 DIAGNOSIS — F419 Anxiety disorder, unspecified: Secondary | ICD-10-CM | POA: Diagnosis not present

## 2023-10-21 DIAGNOSIS — F411 Generalized anxiety disorder: Secondary | ICD-10-CM

## 2023-10-21 NOTE — Progress Notes (Addendum)
 Virtual Visit via Video Group Note  I connected with Mark [name] on 10/21/23 at 5:00-6:30 pm EST by a video enabled telemedicine application and verified that I am speaking with the correct person using two identifiers.   I discussed the limitations of evaluation and management by telemedicine and the availability of in person appointments. The patient expressed understanding and agreed to proceed.  LOCATION: Patient: Home Provider: home Office  History of Present Illness: [facility], [facility], [facility].  Treatment Goal Addressed:  Pt will develop the ability to recognize, accept, and cope with feelings of anxiety AEB self report.  Progress towards Goal:  Progressing    Observation/Objective:   Patient participated in a group discussion on depression. Clinician provided education on the cycle of depression, depression medications available and alternatives.    Patient was encouraged to know triggers, symptoms and available coping skills.     2025 WORD: INTENTIONAL:    Assessment and Plan:  Counselor will continue to meet with patient to address treatment plan goals. Patient will continue to follow recommendations of providers and implement skills learned in session. Diagnosis: MDD, Anxiety, PTSD    Follow-up instructions:I discussed the assessment and treatment plan with the patient. Patient was provided an opportunity to ask questions and all were answered. The patient agreed with the plan and demonstrated an understanding of the instructions.   The patient was advised to call back or seek an in-person evaluation if the symptoms worsen or if the condition fails to improve as anticipated.  Collaboration of Care: Other: Continue to see providers at [facility]  Patient/Guardian was advised Release of Information must be obtained prior to any record release in order to collaborate their care with an outside provider. Patient/Guardian  was advised if they have not already done so to contact the registration department to sign all necessary forms in order for us  to release information regarding their care.   Consent: Patient/Guardian gives verbal consent for treatment and assignment of benefits for services provided during this visit. Patient/Guardian expressed understanding and agreed to proceed.      I provided 90 minutes of non-face-to-face time during this encounter.   Tenn[place] Hanlon S, LCAS              10/21/23

## 2023-10-24 ENCOUNTER — Ambulatory Visit (INDEPENDENT_AMBULATORY_CARE_PROVIDER_SITE_OTHER): Admitting: Licensed Clinical Social Worker

## 2023-10-24 DIAGNOSIS — F411 Generalized anxiety disorder: Secondary | ICD-10-CM | POA: Diagnosis not present

## 2023-10-24 DIAGNOSIS — F4310 Post-traumatic stress disorder, unspecified: Secondary | ICD-10-CM | POA: Diagnosis not present

## 2023-10-24 DIAGNOSIS — F331 Major depressive disorder, recurrent, moderate: Secondary | ICD-10-CM | POA: Diagnosis not present

## 2023-10-28 ENCOUNTER — Ambulatory Visit (HOSPITAL_COMMUNITY): Admitting: Licensed Clinical Social Worker

## 2023-10-30 ENCOUNTER — Ambulatory Visit (HOSPITAL_COMMUNITY): Admitting: Licensed Clinical Social Worker

## 2023-11-04 ENCOUNTER — Ambulatory Visit (HOSPITAL_COMMUNITY): Admitting: Licensed Clinical Social Worker

## 2023-11-04 ENCOUNTER — Encounter (HOSPITAL_COMMUNITY): Payer: Self-pay | Admitting: Licensed Clinical Social Worker

## 2023-11-04 NOTE — Progress Notes (Signed)
 Virtual Visit via Video Note  I connected with Mark [name] on 10/24/23 at 1-2pm EST by video-enabled telemedicine with the correct person using two identifiers.   I discussed the limitations of evaluation and management by telemedicine and the availability of in person appointments. The patient expressed understanding and agreed to proceed.  LOCATION Patient: home Provider: home office    History of Present Illness: Pt was referred to [facility] OP therapy for PTSD, depression and anxiety by the [facility].  Treatment Goal Addressed:   Pt will reduce irritability and increase normal social interaction with family and friends and will develop the ability to recognize, accept, and cope with feelings of depression and anxiety by using practiced coping skills for depression 1-2x daily AEB self report.  Progress towards Goal:  Progressing    Observations/Objective: Pt presented for today's session anxious, oriented x5, with no evidence or self-report of SI/HI or A/V H.  Patient reported ongoing compliance with medication. Clinician inquired about patient's current emotional ratings, as well as any significant changes in thoughts, feelings or behavior since previous session. Patient reported scores of 9/10 for depression, 8/10 for anxiety, 4/10 for anger/irritability, and denied any panic attacks. Pt identified other stressors that occurred in his life over the past week, along with coping skills.:  grounding and breathing exercises. Stressors: packing up house, family dysfunction, moving back to [place], family, moving back to toxic environment, giving away ESA,  financial issues, chronic pain,  amputee disability, lack of family support.  My depression has increased. My wheelchair has broken again and will come next week to repair it again. I can't go anywhere again, can't go to church Sunday.   Cln asked open ended questions and used Clinician validated importance of  Self-compassion and positive self  talk during these challenging times.     Collaboration of Care: Other: Continue seeing providers at [place].    Patient/Guardian was advised Release of Information must be obtained prior to any record release in order to collaborate their care with an outside provider. Patient/Guardian was advised if they have not already done so to contact the registration department to sign all necessary forms in order for us  to release information regarding their care.    Consent: Patient/Guardian gives verbal consent for treatment and assignment of benefits for services provided during this visit. Patient/Guardian expressed understanding and agreed to proceed.     Assessment and Plan: Counselor will continue to meet with patient and address treatment plan goals. Patient was given a chance for recommendations of providers and implement skills learned in session and encouraged to practice coping skills between sessions, practicing between sessions.Diagnosis: anxiety, depression and ptsd  Follow Up Instructions I discussed the assessment and treatment plan with the patient. The patient was provided an opportunity to ask questions and all were answered. The patient agreed with the plan and demonstrated an understanding of the instructions.   The patient was advised to call back or seek an in-person evaluation if the symptoms worsen or if the condition fails to improve as anticipated.  I provided 60 minutes of non-face-to-face time during this encounter.   Francely Craw S, LCAS  .

## 2023-11-05 ENCOUNTER — Ambulatory Visit (INDEPENDENT_AMBULATORY_CARE_PROVIDER_SITE_OTHER): Admitting: Licensed Clinical Social Worker

## 2023-11-05 DIAGNOSIS — F4310 Post-traumatic stress disorder, unspecified: Secondary | ICD-10-CM | POA: Diagnosis not present

## 2023-11-05 DIAGNOSIS — F411 Generalized anxiety disorder: Secondary | ICD-10-CM | POA: Diagnosis not present

## 2023-11-05 DIAGNOSIS — F331 Major depressive disorder, recurrent, moderate: Secondary | ICD-10-CM | POA: Diagnosis not present

## 2023-11-06 ENCOUNTER — Ambulatory Visit (INDEPENDENT_AMBULATORY_CARE_PROVIDER_SITE_OTHER): Admitting: Licensed Clinical Social Worker

## 2023-11-06 ENCOUNTER — Ambulatory Visit (HOSPITAL_COMMUNITY): Admitting: Licensed Clinical Social Worker

## 2023-11-06 DIAGNOSIS — F331 Major depressive disorder, recurrent, moderate: Secondary | ICD-10-CM

## 2023-11-06 DIAGNOSIS — F4310 Post-traumatic stress disorder, unspecified: Secondary | ICD-10-CM

## 2023-11-06 DIAGNOSIS — F419 Anxiety disorder, unspecified: Secondary | ICD-10-CM | POA: Diagnosis not present

## 2023-11-06 DIAGNOSIS — F32A Depression, unspecified: Secondary | ICD-10-CM | POA: Diagnosis not present

## 2023-11-06 DIAGNOSIS — F411 Generalized anxiety disorder: Secondary | ICD-10-CM

## 2023-11-10 ENCOUNTER — Encounter (HOSPITAL_COMMUNITY): Payer: Self-pay | Admitting: Licensed Clinical Social Worker

## 2023-11-10 NOTE — Progress Notes (Unsigned)
 Virtual Visit via Video Group Note  I connected with Mark [name] on 10/21/23 at 5:00-6:30 pm EST by a video enabled telemedicine application and verified that I am speaking with the correct person using two identifiers.   I discussed the limitations of evaluation and management by telemedicine and the availability of in person appointments. The patient expressed understanding and agreed to proceed.  LOCATION: Patient: Home Provider: home Office  History of Present Illness: [facility], [facility], [facility].  Treatment Goal Addressed:  Pt will develop the ability to recognize, accept, and cope with feelings of anxiety AEB self report.  Progress towards Goal:  Progressing    Observation/Objective:   Patient participated in a group discussion on depression. Clinician provided education on the cycle of depression, depression medications available and alternatives.    Patient was encouraged to know triggers, symptoms and available coping skills.     2025 WORD: INTENTIONAL:    Assessment and Plan:  Counselor will continue to meet with patient to address treatment plan goals. Patient will continue to follow recommendations of providers and implement skills learned in session.    Follow-up instructions:I discussed the assessment and treatment plan with the patient. Patient was provided an opportunity to ask questions and all were answered. The patient agreed with the plan and demonstrated an understanding of the instructions.   The patient was advised to call back or seek an in-person evaluation if the symptoms worsen or if the condition fails to improve as anticipated.  Collaboration of Care: Other: Continue to see providers at [facility]  Patient/Guardian was advised Release of Information must be obtained prior to any record release in order to collaborate their care with an outside provider. Patient/Guardian was advised if they have not  already done so to contact the registration department to sign all necessary forms in order for us  to release information regarding their care.   Consent: Patient/Guardian gives verbal consent for treatment and assignment of benefits for services provided during this visit. Patient/Guardian expressed understanding and agreed to proceed.      I provided 90 minutes of face-to-face time during this encounter.   [name], LCAS              11/05/23

## 2023-11-11 ENCOUNTER — Ambulatory Visit (HOSPITAL_COMMUNITY): Admitting: Licensed Clinical Social Worker

## 2023-11-11 ENCOUNTER — Encounter (HOSPITAL_COMMUNITY): Payer: Self-pay | Admitting: Licensed Clinical Social Worker

## 2023-11-11 DIAGNOSIS — F32A Depression, unspecified: Secondary | ICD-10-CM | POA: Diagnosis not present

## 2023-11-11 DIAGNOSIS — F419 Anxiety disorder, unspecified: Secondary | ICD-10-CM | POA: Diagnosis not present

## 2023-11-11 DIAGNOSIS — F411 Generalized anxiety disorder: Secondary | ICD-10-CM

## 2023-11-11 DIAGNOSIS — F4310 Post-traumatic stress disorder, unspecified: Secondary | ICD-10-CM | POA: Diagnosis not present

## 2023-11-11 DIAGNOSIS — F331 Major depressive disorder, recurrent, moderate: Secondary | ICD-10-CM

## 2023-11-11 NOTE — Progress Notes (Addendum)
 Virtual Visit via Video Group Note  I connected with Mark [name] on 11/11/23 at 5:00-6:30 pm EST by a video enabled telemedicine application and verified that I am speaking with the correct person using two identifiers.   I discussed the limitations of evaluation and management by telemedicine and the availability of in person appointments. The patient expressed understanding and agreed to proceed.  LOCATION: Patient: Home Provider: home Office  History of Present Illness: [facility], depression, [facility].  Treatment Goal Addressed:  Pt will develop the ability to recognize, accept, and cope with feelings of anxiety AEB self report.  Progress towards Goal:  Progressing    Observation/Objective:  Patient participated in a group discussion on boundaries, the different types of boundaries. Psychoeducation was provided on boundaries. Pt was encouraged to continue to use boundaries.        2025 WORD: INTENTIONAL:    Assessment and Plan:  Counselor will continue to meet with patient to address treatment plan goals. Patient will continue to follow recommendations of providers and implement skills learned in session. MDD, Anxiety, PTSD    Follow-up instructions:I discussed the assessment and treatment plan with the patient. Patient was provided an opportunity to ask questions and all were answered. The patient agreed with the plan and demonstrated an understanding of the instructions.   The patient was advised to call back or seek an in-person evaluation if the symptoms worsen or if the condition fails to improve as anticipated.  Collaboration of Care: Other: Continue to see providers at [facility]  Patient/Guardian was advised Release of Information must be obtained prior to any record release in order to collaborate their care with an outside provider. Patient/Guardian was advised if they have not already done so to contact the  registration department to sign all necessary forms in order for us  to release information regarding their care.   Consent: Patient/Guardian gives verbal consent for treatment and assignment of benefits for services provided during this visit. Patient/Guardian expressed understanding and agreed to proceed.      I provided 90 minutes of non-face-to-face time during this encounter.   Joevanni Roddey S, LCAS              11/11/23

## 2023-11-11 NOTE — Progress Notes (Signed)
 Virtual Visit via Video Note  I connected with Mark [name] on 11/06/23 at 1-2pm EST by video-enabled telemedicine with the correct person using two identifiers.   I discussed the limitations of evaluation and management by telemedicine and the availability of in person appointments. The patient expressed understanding and agreed to proceed.  LOCATION Patient: home Provider: home office    History of Present Illness: Pt was referred to [facility] OP therapy for PTSD, depression and anxiety by the [facility]  Treatment Goal Addressed:   Pt will reduce irritability and increase normal social interaction with family and friends and will develop the ability to recognize, accept, and cope with feelings of depression and anxiety by using practiced coping skills for depression 1-2x daily AEB self report.  Progress towards Goal:  Progressing    Observations/Objective: Pt presented for today's session anxious, oriented x5, with no evidence or self-report of SI/HI or A/V H.  Patient reported ongoing compliance with medication. Clinician inquired about patient's current emotional ratings, as well as any significant changes in thoughts, feelings or behavior since previous session. Patient reported scores of 9/10 for depression, 8/10 for anxiety, 4/10 for anger/irritability, and denied any panic attacks. Pt identified other stressors that occurred in his life over the past week, along with coping skills.:  grounding and breathing exercises. Stressors: packing up house, family dysfunction, moving back to [place], family, moving back to toxic environment, giving away ESA,  financial issues, chronic pain,  amputee disability, lack of family support.  My depression has increased. I never want to leave the house. Cln provided psychoedcuation on the cycle of depression and practiced grounding in session.   Collaboration of Care: Other: Continue seeing providers at [place].    Patient/Guardian was advised Release of  Information must be obtained prior to any record release in order to collaborate their care with an outside provider. Patient/Guardian was advised if they have not already done so to contact the registration department to sign all necessary forms in order for us  to release information regarding their care.    Consent: Patient/Guardian gives verbal consent for treatment and assignment of benefits for services provided during this visit. Patient/Guardian expressed understanding and agreed to proceed.     Assessment and Plan: Counselor will continue to meet with patient and address treatment plan goals. Patient was given a chance for recommendations of providers and implement skills learned in session and encouraged to practice coping skills between sessions, practicing between sessions.Diagnosis: anxiety, depression and ptsd  Follow Up Instructions I discussed the assessment and treatment plan with the patient. The patient was provided an opportunity to ask questions and all were answered. The patient agreed with the plan and demonstrated an understanding of the instructions.   The patient was advised to call back or seek an in-person evaluation if the symptoms worsen or if the condition fails to improve as anticipated.  I provided 60 minutes of non-face-to-face time during this encounter.   Dalisa Forrer S, LCAS  .

## 2023-11-13 ENCOUNTER — Ambulatory Visit (HOSPITAL_COMMUNITY): Admitting: Licensed Clinical Social Worker

## 2023-11-14 ENCOUNTER — Ambulatory Visit (INDEPENDENT_AMBULATORY_CARE_PROVIDER_SITE_OTHER): Admitting: Licensed Clinical Social Worker

## 2023-11-14 DIAGNOSIS — F331 Major depressive disorder, recurrent, moderate: Secondary | ICD-10-CM | POA: Diagnosis not present

## 2023-11-14 DIAGNOSIS — F4310 Post-traumatic stress disorder, unspecified: Secondary | ICD-10-CM | POA: Diagnosis not present

## 2023-11-14 DIAGNOSIS — F411 Generalized anxiety disorder: Secondary | ICD-10-CM | POA: Diagnosis not present

## 2023-11-17 ENCOUNTER — Encounter (HOSPITAL_COMMUNITY): Payer: Self-pay | Admitting: Licensed Clinical Social Worker

## 2023-11-18 ENCOUNTER — Ambulatory Visit (INDEPENDENT_AMBULATORY_CARE_PROVIDER_SITE_OTHER): Admitting: Licensed Clinical Social Worker

## 2023-11-18 ENCOUNTER — Ambulatory Visit (HOSPITAL_COMMUNITY): Admitting: Licensed Clinical Social Worker

## 2023-11-18 ENCOUNTER — Encounter (HOSPITAL_COMMUNITY): Payer: Self-pay | Admitting: Licensed Clinical Social Worker

## 2023-11-18 DIAGNOSIS — F331 Major depressive disorder, recurrent, moderate: Secondary | ICD-10-CM | POA: Diagnosis not present

## 2023-11-18 DIAGNOSIS — F4310 Post-traumatic stress disorder, unspecified: Secondary | ICD-10-CM

## 2023-11-18 DIAGNOSIS — F411 Generalized anxiety disorder: Secondary | ICD-10-CM | POA: Diagnosis not present

## 2023-11-18 NOTE — Progress Notes (Signed)
 Virtual Visit via Video Note  I connected with Mark [name] on 11/14/23 at 4-5pm EST by video-enabled telemedicine with the correct person using two identifiers.   I discussed the limitations of evaluation and management by telemedicine and the availability of in person appointments. The patient expressed understanding and agreed to proceed.  LOCATION Patient: home Provider: home office    History of Present Illness: Pt was referred to [facility] OP therapy for PTSD, depression and anxiety by the [facility].  Treatment Goal Addressed:   Pt will reduce irritability and increase normal social interaction with family and friends and will develop the ability to recognize, accept, and cope with feelings of depression and anxiety by using practiced coping skills for depression 1-2x daily AEB self report.  Progress towards Goal:  Progressing    Observations/Objective: Pt presented for today's session anxious, oriented x5, with no evidence or self-report of SI/HI or A/V H.  Patient reported ongoing compliance with medication. Clinician inquired about patient's current emotional ratings, as well as any significant changes in thoughts, feelings or behavior since previous session. Patient reported scores of 8/10 for depression, 8/10 for anxiety, 4/10 for anger/irritability, and denied any panic attacks. Pt identified other stressors that occurred in his life over the past week, along with coping skills.:  grounding and breathing exercises. Stressors: packing up house, family dysfunction, moving back to [place], family, moving back to toxic environment, giving away ESA,  financial issues, chronic pain,  amputee disability, lack of family support.  My depression continued to be high and I still never want to leave the house. Again, cln provided psychoedcuation on the cycle of depression and again,practiced grounding in session.   Collaboration of Care: Other: Continue seeing providers at [place].     Patient/Guardian was advised Release of Information must be obtained prior to any record release in order to collaborate their care with an outside provider. Patient/Guardian was advised if they have not already done so to contact the registration department to sign all necessary forms in order for us  to release information regarding their care.    Consent: Patient/Guardian gives verbal consent for treatment and assignment of benefits for services provided during this visit. Patient/Guardian expressed understanding and agreed to proceed.     Assessment and Plan: Counselor will continue to meet with patient and address treatment plan goals. Patient was given a chance for recommendations of providers and implement skills learned in session and encouraged to practice coping skills between sessions, practicing between sessions.Diagnosis: anxiety, depression and ptsd  Follow Up Instructions I discussed the assessment and treatment plan with the patient. The patient was provided an opportunity to ask questions and all were answered. The patient agreed with the plan and demonstrated an understanding of the instructions.   The patient was advised to call back or seek an in-person evaluation if the symptoms worsen or if the condition fails to improve as anticipated.  I provided 60 minutes of non-face-to-face time during this encounter.   Dian Laprade S, LCAS  .

## 2023-11-18 NOTE — Progress Notes (Addendum)
 Virtual Visit via Video Group Note  I connected with Mark [name] on 11/18/23 at 5:00-6:30 pm EST by a video enabled telemedicine application and verified that I am speaking with the correct person using two identifiers.   I discussed the limitations of evaluation and management by telemedicine and the availability of in person appointments. The patient expressed understanding and agreed to proceed.  LOCATION: Patient: Home Provider: home Office  History of Present Illness: [facility], [facility], [facility].  Treatment Goal Addressed:  Pt will develop the ability to recognize, accept, and cope with feelings of anxiety AEB self report.  Progress towards Goal:  Progressing    Observation/Objective:  Patient participated in a group discussion on boundaries, especially the difficulty of using them with family. Cln educated group on the different types of boundaries: rigid, personal, and permeable. Patient was encouraged to use his personal boundaries, especially with family.      2025 WORD: INTENTIONAL:    Assessment and Plan:  Counselor will continue to meet with patient to address treatment plan goals. Patient will continue to follow recommendations of providers and implement skills learned in session. Diagnosis: MDD, Anxiety, PTSD    Follow-up instructions:I discussed the assessment and treatment plan with the patient. Patient was provided an opportunity to ask questions and all were answered. The patient agreed with the plan and demonstrated an understanding of the instructions.   The patient was advised to call back or seek an in-person evaluation if the symptoms worsen or if the condition fails to improve as anticipated.  Collaboration of Care: Other: Continue to see providers at [facility]  Patient/Guardian was advised Release of Information must be obtained prior to any record release in order to collaborate their care with an  outside provider. Patient/Guardian was advised if they have not already done so to contact the registration department to sign all necessary forms in order for us  to release information regarding their care.   Consent: Patient/Guardian gives verbal consent for treatment and assignment of benefits for services provided during this visit. Patient/Guardian expressed understanding and agreed to proceed.      I provided 90 minutes of non-face-to-face time during this encounter.   Audria Takeshita S, LCAS              11/18/23

## 2023-11-20 ENCOUNTER — Ambulatory Visit (HOSPITAL_COMMUNITY): Admitting: Licensed Clinical Social Worker

## 2023-11-21 ENCOUNTER — Ambulatory Visit (INDEPENDENT_AMBULATORY_CARE_PROVIDER_SITE_OTHER): Admitting: Licensed Clinical Social Worker

## 2023-11-21 ENCOUNTER — Encounter (HOSPITAL_COMMUNITY): Payer: Self-pay | Admitting: Licensed Clinical Social Worker

## 2023-11-21 DIAGNOSIS — F411 Generalized anxiety disorder: Secondary | ICD-10-CM

## 2023-11-21 DIAGNOSIS — F4310 Post-traumatic stress disorder, unspecified: Secondary | ICD-10-CM

## 2023-11-21 DIAGNOSIS — F331 Major depressive disorder, recurrent, moderate: Secondary | ICD-10-CM | POA: Diagnosis not present

## 2023-11-21 NOTE — Progress Notes (Signed)
 Virtual Visit via Video Note  I connected with Mark [name] on 11/21/23 at 4-5pm EST by video-enabled telemedicine with the correct person using two identifiers.   I discussed the limitations of evaluation and management by telemedicine and the availability of in person appointments. The patient expressed understanding and agreed to proceed.  LOCATION Patient: home Provider: home office    History of Present Illness: Pt was referred to [facility] OP therapy for PTSD, depression and anxiety by the [facility].  Treatment Goal Addressed:   Pt will reduce irritability and increase normal social interaction with family and friends and will develop the ability to recognize, accept, and cope with feelings of depression and anxiety by using practiced coping skills for depression 1-2x daily AEB self report.  Progress towards Goal:  Progressing    Observations/Objective: Pt presented for today's session anxious, oriented x5, with no evidence or self-report of SI/HI or A/V H.  Patient reported ongoing compliance with medication. Clinician inquired about patient's current emotional ratings, as well as any significant changes in thoughts, feelings or behavior since previous session. Patient reported scores of 8/10 for depression, 8/10 for anxiety, 4/10 for anger/irritability, and denied any panic attacks. Pt identified other stressors that occurred in his life over the past week, along with coping skills.:  grounding and breathing exercises. Stressors: packing up house, family dysfunction, moving back to [place], family, moving back to toxic environment, giving away ESA,  financial issues, chronic pain,  amputee disability, lack of family support.  My depression continues to be high and I still never want to leave the house. However today I'm not as depressed.Cln asked open ended questions about less depressive symptoms today. What are his triggers today? Cln and pt reviewed his usual triggers and role played  coping skills.       Collaboration of Care: Other: Continue seeing providers at [place].    Patient/Guardian was advised Release of Information must be obtained prior to any record release in order to collaborate their care with an outside provider. Patient/Guardian was advised if they have not already done so to contact the registration department to sign all necessary forms in order for us  to release information regarding their care.    Consent: Patient/Guardian gives verbal consent for treatment and assignment of benefits for services provided during this visit. Patient/Guardian expressed understanding and agreed to proceed.     Assessment and Plan: Counselor will continue to meet with patient and address treatment plan goals. Patient was given a chance for recommendations of providers and implement skills learned in session and encouraged to practice coping skills between sessions, practicing between sessions.Diagnosis: anxiety, depression and ptsd  Follow Up Instructions I discussed the assessment and treatment plan with the patient. The patient was provided an opportunity to ask questions and all were answered. The patient agreed with the plan and demonstrated an understanding of the instructions.   The patient was advised to call back or seek an in-person evaluation if the symptoms worsen or if the condition fails to improve as anticipated.  I provided 60 minutes of non-face-to-face time during this encounter.   Nasif Bos S, LCAS  .

## 2023-11-25 ENCOUNTER — Ambulatory Visit (HOSPITAL_COMMUNITY): Admitting: Licensed Clinical Social Worker

## 2023-11-25 ENCOUNTER — Ambulatory Visit (INDEPENDENT_AMBULATORY_CARE_PROVIDER_SITE_OTHER): Admitting: Licensed Clinical Social Worker

## 2023-11-25 ENCOUNTER — Encounter (HOSPITAL_COMMUNITY): Payer: Self-pay | Admitting: Licensed Clinical Social Worker

## 2023-11-25 DIAGNOSIS — F4310 Post-traumatic stress disorder, unspecified: Secondary | ICD-10-CM | POA: Diagnosis not present

## 2023-11-25 DIAGNOSIS — F331 Major depressive disorder, recurrent, moderate: Secondary | ICD-10-CM | POA: Diagnosis not present

## 2023-11-25 DIAGNOSIS — F411 Generalized anxiety disorder: Secondary | ICD-10-CM | POA: Diagnosis not present

## 2023-11-25 NOTE — Progress Notes (Addendum)
 Virtual Visit via Video Group Note  I connected with Mark [name] on 11/25/23 at 5:00-6:30 pm EST by a video enabled telemedicine application and verified that I am speaking with the correct person using two identifiers.   I discussed the limitations of evaluation and management by telemedicine and the availability of in person appointments. The patient expressed understanding and agreed to proceed.  LOCATION: Patient: Home Provider: home Office  History of Present Illness: [facility], [facility], [facility].  Treatment Goal Addressed:  Pt will develop the ability to recognize, accept, and cope with feelings of anxiety AEB self report.  Progress towards Goal:  Progressing    Observation/Objective:  Patient participated in a group discussion on making healthy habits. Clinician provided psychoeducation on making healthy choices to make themselves feel better and live longer. Patient was encouraged to make healthy habits. Briefly, discussed Daylight Savings Time ending and how it may affect mental health.       2025 WORD: INTENTIONAL:    Assessment and Plan:  Counselor will continue to meet with patient to address treatment plan goals. Patient will continue to follow recommendations of providers and implement skills learned in session. Diagnosis: MDD, Anxiety, PTSD    Follow-up instructions:I discussed the assessment and treatment plan with the patient. Patient was provided an opportunity to ask questions and all were answered. The patient agreed with the plan and demonstrated an understanding of the instructions.   The patient was advised to call back or seek an in-person evaluation if the symptoms worsen or if the condition fails to improve as anticipated.  Collaboration of Care: Other: Continue to see providers at [facility]  Patient/Guardian was advised Release of Information must be obtained prior to any record release in order to  collaborate their care with an outside provider. Patient/Guardian was advised if they have not already done so to contact the registration department to sign all necessary forms in order for us  to release information regarding their care.   Consent: Patient/Guardian gives verbal consent for treatment and assignment of benefits for services provided during this visit. Patient/Guardian expressed understanding and agreed to proceed.      I provided 90 minutes of non-face-to-face time during this encounter.   Crystalann Korf S, LCAS              11/25/23

## 2023-11-28 ENCOUNTER — Encounter (HOSPITAL_COMMUNITY): Payer: Self-pay | Admitting: Licensed Clinical Social Worker

## 2023-11-28 ENCOUNTER — Ambulatory Visit (INDEPENDENT_AMBULATORY_CARE_PROVIDER_SITE_OTHER): Admitting: Licensed Clinical Social Worker

## 2023-11-28 DIAGNOSIS — F331 Major depressive disorder, recurrent, moderate: Secondary | ICD-10-CM | POA: Diagnosis not present

## 2023-11-28 DIAGNOSIS — F411 Generalized anxiety disorder: Secondary | ICD-10-CM

## 2023-11-28 DIAGNOSIS — F4310 Post-traumatic stress disorder, unspecified: Secondary | ICD-10-CM | POA: Diagnosis not present

## 2023-11-28 NOTE — Progress Notes (Signed)
 Virtual Visit via Video Note  I connected with Mark [name] on 11/28/23 at 4-5pm EST by video-enabled telemedicine with the correct person using two identifiers.   I discussed the limitations of evaluation and management by telemedicine and the availability of in person appointments. The patient expressed understanding and agreed to proceed.  LOCATION Patient: home Provider: home office    History of Present Illness: Pt was referred to [facility] OP therapy for PTSD, depression and anxiety by the [facility].  Treatment Goal Addressed:   Pt will reduce irritability and increase normal social interaction with family and friends and will develop the ability to recognize, accept, and cope with feelings of depression and anxiety by using practiced coping skills for depression 1-2x daily AEB self report.  Progress towards Goal:  Progressing    Observations/Objective: Pt presented for today's session anxious, oriented x5, with no evidence or self-report of SI/HI or A/V H.  Patient reported ongoing compliance with medication. Clinician inquired about patient's current emotional ratings, as well as any significant changes in thoughts, feelings or behavior since previous session. Patient reported scores of 8/10 for depression, 5/10 for anxiety, 4/10 for anger/irritability, and denied any panic attacks. Pt identified other stressors that occurred in his life over the past week, along with coping skills.:  grounding and breathing exercises. Stressors: packing up house, family dysfunction, moving back to [place], family, moving back to toxic environment, giving away ESA,  financial issues, chronic pain,  amputee disability, lack of family support, MRI results of bottom. Pt reports he got a DVD of his MRI but he can't use it in his [employer] computer. Suggested pt take the DVD to the [facility] for interpretation. My depression continues to be high and I still never want to leave the house. I did go vote this week.Cln asked  open ended questions about his depressive symptoms. What are his triggers today? Cln and pt reviewed his usual triggers and role played coping skills that will assist with increasing depressive symptoms.       Collaboration of Care: Other: Continue seeing providers at [place].    Patient/Guardian was advised Release of Information must be obtained prior to any record release in order to collaborate their care with an outside provider. Patient/Guardian was advised if they have not already done so to contact the registration department to sign all necessary forms in order for us  to release information regarding their care.    Consent: Patient/Guardian gives verbal consent for treatment and assignment of benefits for services provided during this visit. Patient/Guardian expressed understanding and agreed to proceed.     Assessment and Plan: Counselor will continue to meet with patient and address treatment plan goals. Patient was given a chance for recommendations of providers and implement skills learned in session and encouraged to practice coping skills between sessions, practicing between sessions.Diagnosis: anxiety, depression and ptsd  Follow Up Instructions I discussed the assessment and treatment plan with the patient. The patient was provided an opportunity to ask questions and all were answered. The patient agreed with the plan and demonstrated an understanding of the instructions.   The patient was advised to call back or seek an in-person evaluation if the symptoms worsen or if the condition fails to improve as anticipated.  I provided 60 minutes of non-face-to-face time during this encounter.   Langley Ingalls S, LCAS  .

## 2023-12-02 ENCOUNTER — Ambulatory Visit (INDEPENDENT_AMBULATORY_CARE_PROVIDER_SITE_OTHER): Admitting: Licensed Clinical Social Worker

## 2023-12-02 ENCOUNTER — Ambulatory Visit (HOSPITAL_COMMUNITY): Admitting: Licensed Clinical Social Worker

## 2023-12-02 ENCOUNTER — Encounter (HOSPITAL_COMMUNITY): Payer: Self-pay | Admitting: Licensed Clinical Social Worker

## 2023-12-02 DIAGNOSIS — F331 Major depressive disorder, recurrent, moderate: Secondary | ICD-10-CM

## 2023-12-02 DIAGNOSIS — F419 Anxiety disorder, unspecified: Secondary | ICD-10-CM

## 2023-12-02 DIAGNOSIS — F4310 Post-traumatic stress disorder, unspecified: Secondary | ICD-10-CM

## 2023-12-02 DIAGNOSIS — F329 Major depressive disorder, single episode, unspecified: Secondary | ICD-10-CM | POA: Diagnosis not present

## 2023-12-02 DIAGNOSIS — F411 Generalized anxiety disorder: Secondary | ICD-10-CM

## 2023-12-02 NOTE — Progress Notes (Signed)
 Virtual Visit via Video Group Note  I connected with Mark [name] on 12/02/23 at 5:00-6:30 pm EST by a video enabled telemedicine application and verified that I am speaking with the correct person using two identifiers.   I discussed the limitations of evaluation and management by telemedicine and the availability of in person appointments. The patient expressed understanding and agreed to proceed.  LOCATION: Patient: Home Provider: home Office  History of Present Illness: [facility], depression, [facility].  Treatment Goal Addressed:  Pt will develop the ability to recognize, accept, and cope with feelings of anxiety, depression and PTSD AEB self report.  Progress towards Goal:  Progressing    Observation/Objective:  Patient participated in a group discussion on making healthy habits. Clinician provided psychoeducation on making healthy choices to make themselves feel better and live longer. Patient was encouraged to make healthy habits. Briefly, discussed Daylight Savings Time ending and how it may affect mental health.       2025 WORD: INTENTIONAL:    Assessment and Plan:  Counselor will continue to meet with patient to address treatment plan goals. Patient will continue to follow recommendations of providers and implement skills learned in session. Diagnosis: MDD, Anxiety, PTSD    Follow-up instructions:I discussed the assessment and treatment plan with the patient. Patient was provided an opportunity to ask questions and all were answered. The patient agreed with the plan and demonstrated an understanding of the instructions.   The patient was advised to call back or seek an in-person evaluation if the symptoms worsen or if the condition fails to improve as anticipated.  Collaboration of Care: Other: Continue to see providers at [facility]  Patient/Guardian was advised Release of Information must be obtained prior to any record  release in order to collaborate their care with an outside provider. Patient/Guardian was advised if they have not already done so to contact the registration department to sign all necessary forms in order for us  to release information regarding their care.   Consent: Patient/Guardian gives verbal consent for treatment and assignment of benefits for services provided during this visit. Patient/Guardian expressed understanding and agreed to proceed.      I provided 90 minutes of non-face-to-face time during this encounter.   Aria Pickrell S, LCAS              12/02/23

## 2023-12-05 ENCOUNTER — Ambulatory Visit (INDEPENDENT_AMBULATORY_CARE_PROVIDER_SITE_OTHER): Admitting: Licensed Clinical Social Worker

## 2023-12-05 DIAGNOSIS — F4310 Post-traumatic stress disorder, unspecified: Secondary | ICD-10-CM | POA: Diagnosis not present

## 2023-12-05 DIAGNOSIS — F331 Major depressive disorder, recurrent, moderate: Secondary | ICD-10-CM

## 2023-12-05 DIAGNOSIS — F419 Anxiety disorder, unspecified: Secondary | ICD-10-CM

## 2023-12-05 DIAGNOSIS — F32A Depression, unspecified: Secondary | ICD-10-CM | POA: Diagnosis not present

## 2023-12-09 ENCOUNTER — Encounter (HOSPITAL_COMMUNITY): Payer: Self-pay | Admitting: Licensed Clinical Social Worker

## 2023-12-09 ENCOUNTER — Ambulatory Visit (HOSPITAL_COMMUNITY): Admitting: Licensed Clinical Social Worker

## 2023-12-09 ENCOUNTER — Ambulatory Visit (INDEPENDENT_AMBULATORY_CARE_PROVIDER_SITE_OTHER): Admitting: Licensed Clinical Social Worker

## 2023-12-09 DIAGNOSIS — F331 Major depressive disorder, recurrent, moderate: Secondary | ICD-10-CM

## 2023-12-09 DIAGNOSIS — F329 Major depressive disorder, single episode, unspecified: Secondary | ICD-10-CM | POA: Diagnosis not present

## 2023-12-09 DIAGNOSIS — F419 Anxiety disorder, unspecified: Secondary | ICD-10-CM

## 2023-12-09 DIAGNOSIS — F4310 Post-traumatic stress disorder, unspecified: Secondary | ICD-10-CM | POA: Diagnosis not present

## 2023-12-09 DIAGNOSIS — F411 Generalized anxiety disorder: Secondary | ICD-10-CM

## 2023-12-09 NOTE — Progress Notes (Signed)
 Virtual Visit via Video Note  I connected with Mark [name] on 12/05/23 at 4-5pm EST by video-enabled telemedicine with the correct person using two identifiers.   I discussed the limitations of evaluation and management by telemedicine and the availability of in person appointments. The patient expressed understanding and agreed to proceed.  LOCATION Patient: home Provider: home office    History of Present Illness: Pt was referred to [facility] OP therapy for PTSD, depression and anxiety by the [facility].  Treatment Goal Addressed:   Pt will reduce irritability and increase normal social interaction with family and friends and will develop the ability to recognize, accept, and cope with feelings of depression and anxiety by using practiced coping skills for depression 1-2x daily AEB self report.  Progress towards Goal:  Progressing    Observations/Objective: Pt presented for today's session anxious, oriented x5, with no evidence or self-report of SI/HI or A/V H.  Patient reported ongoing compliance with medication. Clinician inquired about patient's current emotional ratings, as well as any significant changes in thoughts, feelings or behavior since previous session. Patient reported scores of 6/10 for depression, 5/10 for anxiety, 4/10 for anger/irritability, and denied any panic attacks. Pt identified other stressors that occurred in his life over the past week, along with coping skills.:  grounding and breathing exercises. Stressors: packing up house, family dysfunction, moving back to [place], family, moving back to toxic environment, giving away ESA,  financial issues, chronic pain,  amputee disability, lack of family support, MRI results of bottom.  I have an appt with a infectious disease dr at the [place].My depression continues to be high and I still never want to leave the house. Cln asked open ended questions about his depressive symptoms.? Again, cln and pt reviewed his usual triggers and  role played coping skills that will assist with increasing depressive symptoms.       Collaboration of Care: Other: Continue seeing providers at [place].    Patient/Guardian was advised Release of Information must be obtained prior to any record release in order to collaborate their care with an outside provider. Patient/Guardian was advised if they have not already done so to contact the registration department to sign all necessary forms in order for us  to release information regarding their care.    Consent: Patient/Guardian gives verbal consent for treatment and assignment of benefits for services provided during this visit. Patient/Guardian expressed understanding and agreed to proceed.     Assessment and Plan: Counselor will continue to meet with patient and address treatment plan goals. Patient was given a chance for recommendations of providers and implement skills learned in session and encouraged to practice coping skills between sessions, practicing between sessions.Diagnosis: anxiety, depression and ptsd  Follow Up Instructions I discussed the assessment and treatment plan with the patient. The patient was provided an opportunity to ask questions and all were answered. The patient agreed with the plan and demonstrated an understanding of the instructions.   The patient was advised to call back or seek an in-person evaluation if the symptoms worsen or if the condition fails to improve as anticipated.  I provided 60 minutes of non-face-to-face time during this encounter.   Jatasia Gundrum S, LCAS  .

## 2023-12-09 NOTE — Progress Notes (Signed)
 Virtual Visit via Video Group Note  I connected with Mark [name] on 12/09/23 at 5:00-6:30 pm EST by a video enabled telemedicine application and verified that I am speaking with the correct person using two identifiers.   I discussed the limitations of evaluation and management by telemedicine and the availability of in person appointments. The patient expressed understanding and agreed to proceed.  LOCATION: Patient: Home Provider: home Office  History of Present Illness: [facility]  Treatment Goal Addressed:  Pt will develop the ability to recognize, accept, and cope with feelings of anxiety, depression and PTSD AEB self report.  Progress towards Goal:  Progressing    Observation/Objective:   Patient participated in a discussion on overcoming life's challenges, under stress, which makes it more difficult to overcome obstacles in life. Patient was encouraged to identify life challenges and explore strategies for overcoming obstacles.       2025 WORD: INTENTIONAL:    Assessment and Plan:  Counselor will continue to meet with patient to address treatment plan goals. Patient will continue to follow recommendations of providers and implement skills learned in session. Diagnosis: MDD, Anxiety, PTSD    Follow-up instructions:I discussed the assessment and treatment plan with the patient. Patient was provided an opportunity to ask questions and all were answered. The patient agreed with the plan and demonstrated an understanding of the instructions.   The patient was advised to call back or seek an in-person evaluation if the symptoms worsen or if the condition fails to improve as anticipated.  Collaboration of Care: Other: Continue to see providers at [facility]  Patient/Guardian was advised Release of Information must be obtained prior to any record release in order to collaborate their care with an outside  provider. Patient/Guardian was advised if they have not already done so to contact the registration department to sign all necessary forms in order for us  to release information regarding their care.   Consent: Patient/Guardian gives verbal consent for treatment and assignment of benefits for services provided during this visit. Patient/Guardian expressed understanding and agreed to proceed.      I provided 90 minutes of non-face-to-face time during this encounter.   Christan Defranco S, LCAS              12/09/23

## 2023-12-11 ENCOUNTER — Other Ambulatory Visit: Payer: Self-pay

## 2023-12-12 ENCOUNTER — Ambulatory Visit (HOSPITAL_COMMUNITY): Admitting: Licensed Clinical Social Worker

## 2023-12-16 ENCOUNTER — Ambulatory Visit (INDEPENDENT_AMBULATORY_CARE_PROVIDER_SITE_OTHER): Admitting: Licensed Clinical Social Worker

## 2023-12-16 ENCOUNTER — Ambulatory Visit (HOSPITAL_COMMUNITY): Admitting: Licensed Clinical Social Worker

## 2023-12-16 ENCOUNTER — Encounter (HOSPITAL_COMMUNITY): Payer: Self-pay | Admitting: Licensed Clinical Social Worker

## 2023-12-16 DIAGNOSIS — F4310 Post-traumatic stress disorder, unspecified: Secondary | ICD-10-CM

## 2023-12-16 DIAGNOSIS — F331 Major depressive disorder, recurrent, moderate: Secondary | ICD-10-CM | POA: Diagnosis not present

## 2023-12-16 DIAGNOSIS — F411 Generalized anxiety disorder: Secondary | ICD-10-CM

## 2023-12-16 DIAGNOSIS — F419 Anxiety disorder, unspecified: Secondary | ICD-10-CM | POA: Diagnosis not present

## 2023-12-16 NOTE — Progress Notes (Signed)
 Virtual Visit via Video Group Note  I connected with Mark [name] on 12/16/23 at 5:00-6:30 pm EST by a video enabled telemedicine application and verified that I am speaking with the correct person using two identifiers.   I discussed the limitations of evaluation and management by telemedicine and the availability of in person appointments. The patient expressed understanding and agreed to proceed.  LOCATION: Patient: Home Provider: home Office  History of Present Illness: [facility], [facility], [facility].  Treatment Goal Addressed:  Pt will develop the ability to recognize, accept, and cope with feelings of anxiety, depression and PTSD AEB self report.  Progress towards Goal:  Progressing    Observation/Objective:  Patient participated in a group discussion on "tolerating the holidays" with family members. Patient described his holiday plans and coping skills to deal with feelings surrounding the holidays. Patient was encouraged to express his feelings in positive ways.      2025 WORD: INTENTIONAL:    Assessment and Plan:  Counselor will continue to meet with patient to address treatment plan goals. Patient will continue to follow recommendations of providers and implement skills learned in session. Diagnosis: MDD, Anxiety, PTSD    Follow-up instructions:I discussed the assessment and treatment plan with the patient. Patient was provided an opportunity to ask questions and all were answered. The patient agreed with the plan and demonstrated an understanding of the instructions.   The patient was advised to call back or seek an in-person evaluation if the symptoms worsen or if the condition fails to improve as anticipated.  Collaboration of Care: Other: Continue to see providers at [facility]  Patient/Guardian was advised Release of Information must be obtained prior to any record release in order to collaborate their care with an  outside provider. Patient/Guardian was advised if they have not already done so to contact the registration department to sign all necessary forms in order for us  to release information regarding their care.   Consent: Patient/Guardian gives verbal consent for treatment and assignment of benefits for services provided during this visit. Patient/Guardian expressed understanding and agreed to proceed.      I provided 90 minutes of non-face-to-face time during this encounter.   Vale Mousseau S, LCSW              12/16/23

## 2023-12-23 ENCOUNTER — Ambulatory Visit (HOSPITAL_COMMUNITY): Admitting: Licensed Clinical Social Worker

## 2023-12-23 ENCOUNTER — Ambulatory Visit (INDEPENDENT_AMBULATORY_CARE_PROVIDER_SITE_OTHER): Admitting: Licensed Clinical Social Worker

## 2023-12-23 ENCOUNTER — Encounter (HOSPITAL_COMMUNITY): Payer: Self-pay | Admitting: Licensed Clinical Social Worker

## 2023-12-23 DIAGNOSIS — F411 Generalized anxiety disorder: Secondary | ICD-10-CM

## 2023-12-23 DIAGNOSIS — F331 Major depressive disorder, recurrent, moderate: Secondary | ICD-10-CM

## 2023-12-23 DIAGNOSIS — F4310 Post-traumatic stress disorder, unspecified: Secondary | ICD-10-CM

## 2023-12-23 NOTE — Progress Notes (Signed)
 Virtual Visit via Video Group Note  I connected with Mark [name] on 12/23/23 at 5:00-6:30 pm EST by a video enabled telemedicine application and verified that I am speaking with the correct person using two identifiers.   I discussed the limitations of evaluation and management by telemedicine and the availability of in person appointments. The patient expressed understanding and agreed to proceed.  LOCATION: Patient: Home Provider: home Office  History of Present Illness: [facility], [facility], [facility].  Treatment Goal Addressed:  Pt will develop the ability to recognize, accept, and cope with feelings of anxiety, depression and PTSD AEB self report.  Progress towards Goal:  Progressing    Observation/Objective:  Patient participated in a group discussion on the holidays with family. Patient described his holidays and coping skills used to deal with feelings surrounding the holidays. Clinician congratulated patient on coping skills used and tolerating the holidays with family.      2025 WORD: INTENTIONAL:    Assessment and Plan:  Counselor will continue to meet with patient to address treatment plan goals. Patient will continue to follow recommendations of providers and implement skills learned in session. Diagnosis: MDD, Anxiety, PTSD    Follow-up instructions:I discussed the assessment and treatment plan with the patient. Patient was provided an opportunity to ask questions and all were answered. The patient agreed with the plan and demonstrated an understanding of the instructions.   The patient was advised to call back or seek an in-person evaluation if the symptoms worsen or if the condition fails to improve as anticipated.  Collaboration of Care: Other: Continue to see providers at [facility]  Patient/Guardian was advised Release of Information must be obtained prior to any record release in order to collaborate their care  with an outside provider. Patient/Guardian was advised if they have not already done so to contact the registration department to sign all necessary forms in order for us  to release information regarding their care.   Consent: Patient/Guardian gives verbal consent for treatment and assignment of benefits for services provided during this visit. Patient/Guardian expressed understanding and agreed to proceed.      I provided 90 minutes of non-face-to-face time during this encounter.   Jovian Lembcke S, LCSW              12/23/23

## 2023-12-26 ENCOUNTER — Ambulatory Visit (INDEPENDENT_AMBULATORY_CARE_PROVIDER_SITE_OTHER): Admitting: Licensed Clinical Social Worker

## 2023-12-26 DIAGNOSIS — F419 Anxiety disorder, unspecified: Secondary | ICD-10-CM | POA: Diagnosis not present

## 2023-12-26 DIAGNOSIS — F32A Depression, unspecified: Secondary | ICD-10-CM | POA: Diagnosis not present

## 2023-12-26 DIAGNOSIS — F331 Major depressive disorder, recurrent, moderate: Secondary | ICD-10-CM

## 2023-12-26 DIAGNOSIS — F411 Generalized anxiety disorder: Secondary | ICD-10-CM

## 2023-12-26 DIAGNOSIS — F4310 Post-traumatic stress disorder, unspecified: Secondary | ICD-10-CM

## 2023-12-30 ENCOUNTER — Ambulatory Visit (HOSPITAL_COMMUNITY): Admitting: Licensed Clinical Social Worker

## 2023-12-30 ENCOUNTER — Encounter (HOSPITAL_COMMUNITY): Payer: Self-pay | Admitting: Licensed Clinical Social Worker

## 2023-12-30 ENCOUNTER — Ambulatory Visit (INDEPENDENT_AMBULATORY_CARE_PROVIDER_SITE_OTHER): Admitting: Licensed Clinical Social Worker

## 2023-12-30 DIAGNOSIS — F4310 Post-traumatic stress disorder, unspecified: Secondary | ICD-10-CM | POA: Diagnosis not present

## 2023-12-30 DIAGNOSIS — F331 Major depressive disorder, recurrent, moderate: Secondary | ICD-10-CM

## 2023-12-30 DIAGNOSIS — F411 Generalized anxiety disorder: Secondary | ICD-10-CM

## 2023-12-30 DIAGNOSIS — F419 Anxiety disorder, unspecified: Secondary | ICD-10-CM | POA: Diagnosis not present

## 2023-12-30 DIAGNOSIS — F329 Major depressive disorder, single episode, unspecified: Secondary | ICD-10-CM | POA: Diagnosis not present

## 2023-12-30 NOTE — Progress Notes (Signed)
 Virtual Visit via Video Note  I connected with Mark [name] on 12/30/23 at 4-5pm EST by video-enabled telemedicine with the correct person using two identifiers.   I discussed the limitations of evaluation and management by telemedicine and the availability of in person appointments. The patient expressed understanding and agreed to proceed.  LOCATION Patient: home Provider: home office    History of Present Illness: Pt was referred to [facility] OP therapy for PTSD, depression and anxiety by the [facility].  Treatment Goal Addressed:   Pt will reduce irritability and increase normal social interaction with family and friends and will develop the ability to recognize, accept, and cope with feelings of depression and anxiety by using practiced coping skills for depression 1-2x daily AEB self report.  Progress towards Goal:  Progressing    Observations/Objective: Pt presented for today's session anxious, oriented x5, with no evidence or self-report of SI/HI or A/V H.  Patient reported ongoing compliance with medication. Clinician inquired about patient's current emotional ratings, as well as any significant changes in thoughts, feelings or behavior since previous session. Patient reported scores of 7/10 for depression, 6/10 for anxiety, 4/10 for anger/irritability, and denied any panic attacks. Pt identified other stressors that occurred in his life over the past week, along with coping skills.:  grounding and breathing exercises. Stressors: packing up house, family dysfunction, moving back to [place], family, moving back to toxic environment, giving away ESA,  financial issues, chronic pain,  amputee disability, lack of family support, pending MRI results of bottom.  I have an appt with a infectious disease dr in January.at the [place].My depression continues to be high and I still never want to leave the house. Cln asked open ended questions about his depressive symptoms.? Again, cln and pt reviewed his  usual triggers and role played coping skills that will assist with increasing depressive symptoms, EFT.       Collaboration of Care: Other: Continue seeing providers at [place].    Patient/Guardian was advised Release of Information must be obtained prior to any record release in order to collaborate their care with an outside provider. Patient/Guardian was advised if they have not already done so to contact the registration department to sign all necessary forms in order for us  to release information regarding their care.    Consent: Patient/Guardian gives verbal consent for treatment and assignment of benefits for services provided during this visit. Patient/Guardian expressed understanding and agreed to proceed.     Assessment and Plan: Counselor will continue to meet with patient and address treatment plan goals. Patient was given a chance for recommendations of providers and implement skills learned in session and encouraged to practice coping skills between sessions, practicing between sessions.Diagnosis: anxiety, depression and ptsd  Follow Up Instructions I discussed the assessment and treatment plan with the patient. The patient was provided an opportunity to ask questions and all were answered. The patient agreed with the plan and demonstrated an understanding of the instructions.   The patient was advised to call back or seek an in-person evaluation if the symptoms worsen or if the condition fails to improve as anticipated.  I provided 60 minutes of non-face-to-face time during this encounter.   Heaton Sarin S, LCAS-A  .

## 2023-12-30 NOTE — Progress Notes (Signed)
 Virtual Visit via Video Group Note  I connected with Camellia Fickle on 12/30/23 at 5:00-6:30 pm EST by a video enabled telemedicine application and verified that I am speaking with the correct person using two identifiers.   I discussed the limitations of evaluation and management by telemedicine and the availability of in person appointments. The patient expressed understanding and agreed to proceed.  LOCATION: Patient: Home Provider: home Office  History of Present Illness: [facility], [facility], [facility].  Treatment Goal Addressed:  Pt will develop the ability to recognize, accept, and cope with feelings of anxiety, depression and PTSD AEB self report.  Progress towards Goal:  Progressing    Observation/Objective:     Pt participated in a discussion on current source of stressors, especially during the holidays. Pt identified his current stressor: Family, which led the discussion of effective coping skills  Pt was encouraged to continue using current coping techniques.    2025 WORD: INTENTIONAL:    Assessment and Plan:  Counselor will continue to meet with patient to address treatment plan goals. Patient will continue to follow recommendations of providers and implement skills learned in session. Diagnosis: MDD, Anxiety, PTSD    Follow-up instructions:I discussed the assessment and treatment plan with the patient. Patient was provided an opportunity to ask questions and all were answered. The patient agreed with the plan and demonstrated an understanding of the instructions.   The patient was advised to call back or seek an in-person evaluation if the symptoms worsen or if the condition fails to improve as anticipated.  Collaboration of Care: Other: Continue to see providers at [facility]  Patient/Guardian was advised Release of Information must be obtained prior to any record release in order to collaborate their care with an  outside provider. Patient/Guardian was advised if they have not already done so to contact the registration department to sign all necessary forms in order for us  to release information regarding their care.   Consent: Patient/Guardian gives verbal consent for treatment and assignment of benefits for services provided during this visit. Patient/Guardian expressed understanding and agreed to proceed.      I provided 90 minutes of non-face-to-face time during this encounter.   Kellie Murrill S, LCAS-A              12/30/23

## 2024-01-02 ENCOUNTER — Ambulatory Visit (INDEPENDENT_AMBULATORY_CARE_PROVIDER_SITE_OTHER): Admitting: Licensed Clinical Social Worker

## 2024-01-02 DIAGNOSIS — F331 Major depressive disorder, recurrent, moderate: Secondary | ICD-10-CM | POA: Diagnosis not present

## 2024-01-02 DIAGNOSIS — F411 Generalized anxiety disorder: Secondary | ICD-10-CM

## 2024-01-02 DIAGNOSIS — F4310 Post-traumatic stress disorder, unspecified: Secondary | ICD-10-CM | POA: Diagnosis not present

## 2024-01-06 ENCOUNTER — Ambulatory Visit (HOSPITAL_COMMUNITY): Admitting: Licensed Clinical Social Worker

## 2024-01-06 ENCOUNTER — Ambulatory Visit (INDEPENDENT_AMBULATORY_CARE_PROVIDER_SITE_OTHER): Admitting: Licensed Clinical Social Worker

## 2024-01-06 ENCOUNTER — Encounter (HOSPITAL_COMMUNITY): Payer: Self-pay | Admitting: Licensed Clinical Social Worker

## 2024-01-06 DIAGNOSIS — F4310 Post-traumatic stress disorder, unspecified: Secondary | ICD-10-CM

## 2024-01-06 DIAGNOSIS — F331 Major depressive disorder, recurrent, moderate: Secondary | ICD-10-CM | POA: Diagnosis not present

## 2024-01-06 DIAGNOSIS — F411 Generalized anxiety disorder: Secondary | ICD-10-CM | POA: Diagnosis not present

## 2024-01-06 NOTE — Progress Notes (Signed)
 Virtual Visit via Video Group Note  I connected with Mark [name] on 01/06/24 at 5:00-6:30 pm EST by a video enabled telemedicine application and verified that I am speaking with the correct person using two identifiers.   I discussed the limitations of evaluation and management by telemedicine and the availability of in person appointments. The patient expressed understanding and agreed to proceed.  LOCATION: Patient: Home Provider: home Office  History of Present Illness: [facility], [facility], [facility].  Treatment Goal Addressed:  Pt will develop the ability to recognize, accept, and cope with feelings of anxiety, depression and PTSD AEB self report.  Progress towards Goal:  Progressing    Observation/Objective:    Patient participated in a group discussion on tolerating the holidays with family members. Patient described his holiday plans and coping skills to deal with feelings surrounding the holidays. Patient was encouraged to express his feelings in positive ways.     2025 WORD: INTENTIONAL:    Assessment and Plan:  Counselor will continue to meet with patient to address treatment plan goals. Patient will continue to follow recommendations of providers and implement skills learned in session. Diagnosis: MDD, Anxiety, PTSD    Follow-up instructions:I discussed the assessment and treatment plan with the patient. Patient was provided an opportunity to ask questions and all were answered. The patient agreed with the plan and demonstrated an understanding of the instructions.   The patient was advised to call back or seek an in-person evaluation if the symptoms worsen or if the condition fails to improve as anticipated.  Collaboration of Care: Other: Continue to see providers at [facility]  Patient/Guardian was advised Release of Information must be obtained prior to any record release in order to collaborate their care with an  outside provider. Patient/Guardian was advised if they have not already done so to contact the registration department to sign all necessary forms in order for us  to release information regarding their care.   Consent: Patient/Guardian gives verbal consent for treatment and assignment of benefits for services provided during this visit. Patient/Guardian expressed understanding and agreed to proceed.      I provided 90 minutes of non-face-to-face time during this encounter.   Treasa Bradshaw S, LCAS-A              01/06/24

## 2024-01-07 ENCOUNTER — Encounter (HOSPITAL_COMMUNITY): Payer: Self-pay | Admitting: Licensed Clinical Social Worker

## 2024-01-07 NOTE — Progress Notes (Signed)
 Virtual Visit via Video Note  I connected with Mark [name] on 01/07/24 at 4-5pm EST by video-enabled telemedicine with the correct person using two identifiers.   I discussed the limitations of evaluation and management by telemedicine and the availability of in person appointments. The patient expressed understanding and agreed to proceed.  LOCATION Patient: home Provider: home office    History of Present Illness: Pt was referred to [facility] OP therapy for PTSD, depression and anxiety by the [facility].  Treatment Goal Addressed:   Pt will reduce irritability and increase normal social interaction with family and friends and will develop the ability to recognize, accept, and cope with feelings of depression and anxiety by using practiced coping skills for depression 1-2x daily AEB self report.  Progress towards Goal:  Progressing    Observations/Objective: Pt presented for today's session anxious, oriented x5, with no evidence or self-report of SI/HI or A/V H.  Patient reported ongoing compliance with medication. Clinician inquired about patient's current emotional ratings, as well as any significant changes in thoughts, feelings or behavior since previous session. Patient reported scores of 7/10 for depression, 6/10 for anxiety, 4/10 for anger/irritability, and denied any panic attacks. Pt identified other stressors that occurred in his life over the past week, along with coping skills.:  grounding and breathing exercises. Stressors: packing up house, family dysfunction, moving back to [place], family, moving back to toxic environment, giving away ESA,  financial issues, chronic pain,  amputee disability, lack of family support, pending MRI results of bottom.  I have an appt with a infectious disease dr in January.at the [place].My depression continues to be high and I still never want to leave the house. Cln asked open ended questions about his depressive and anxiety symptoms.. Again, cln and pt  reviewed his usual triggers. Ive decided to go home for Christmas. I have mixed feelings about going.        Collaboration of Care: Other: Continue seeing providers at [place].    Patient/Guardian was advised Release of Information must be obtained prior to any record release in order to collaborate their care with an outside provider. Patient/Guardian was advised if they have not already done so to contact the registration department to sign all necessary forms in order for us  to release information regarding their care.    Consent: Patient/Guardian gives verbal consent for treatment and assignment of benefits for services provided during this visit. Patient/Guardian expressed understanding and agreed to proceed.     Assessment and Plan: Counselor will continue to meet with patient and address treatment plan goals. Patient was given a chance for recommendations of providers and implement skills learned in session and encouraged to practice coping skills between sessions, practicing between sessions.Diagnosis: anxiety, depression and ptsd  Follow Up Instructions I discussed the assessment and treatment plan with the patient. The patient was provided an opportunity to ask questions and all were answered. The patient agreed with the plan and demonstrated an understanding of the instructions.   The patient was advised to call back or seek an in-person evaluation if the symptoms worsen or if the condition fails to improve as anticipated.  I provided 60 minutes of non-face-to-face time during this encounter.   Charla Criscione S, LCAS-A  .

## 2024-01-09 ENCOUNTER — Ambulatory Visit (INDEPENDENT_AMBULATORY_CARE_PROVIDER_SITE_OTHER): Admitting: Licensed Clinical Social Worker

## 2024-01-09 DIAGNOSIS — F4310 Post-traumatic stress disorder, unspecified: Secondary | ICD-10-CM

## 2024-01-09 DIAGNOSIS — F419 Anxiety disorder, unspecified: Secondary | ICD-10-CM

## 2024-01-09 DIAGNOSIS — F411 Generalized anxiety disorder: Secondary | ICD-10-CM

## 2024-01-09 DIAGNOSIS — F331 Major depressive disorder, recurrent, moderate: Secondary | ICD-10-CM

## 2024-01-09 DIAGNOSIS — F32A Depression, unspecified: Secondary | ICD-10-CM | POA: Diagnosis not present

## 2024-01-11 ENCOUNTER — Encounter (HOSPITAL_COMMUNITY): Payer: Self-pay | Admitting: Emergency Medicine

## 2024-01-11 ENCOUNTER — Ambulatory Visit (HOSPITAL_COMMUNITY): Admission: EM | Admit: 2024-01-11 | Discharge: 2024-01-11 | Disposition: A | Discharge status: Home / Self Care

## 2024-01-11 DIAGNOSIS — J40 Bronchitis, not specified as acute or chronic: Secondary | ICD-10-CM | POA: Diagnosis not present

## 2024-01-11 MED ORDER — AZITHROMYCIN 250 MG PO TABS: ORAL_TABLET | ORAL | 0 refills | Status: AC

## 2024-01-11 MED ORDER — AZELASTINE HCL 0.1 % NA SOLN: 1.0000 | Freq: Two times a day (BID) | NASAL | 1 refills | Status: AC

## 2024-01-11 MED ORDER — ACETAMINOPHEN 500 MG PO TABS: 1000.0000 mg | ORAL_TABLET | Freq: Four times a day (QID) | ORAL | 0 refills | Status: AC | PRN

## 2024-01-11 NOTE — Discharge Instructions (Signed)
" °  1. Bronchitis (Primary) - azelastine  (ASTELIN ) 0.1 % nasal spray; Place 1 spray into both nostrils 2 (two) times daily. Use in each nostril as directed  Dispense: 30 mL; Refill: 1 - acetaminophen  (TYLENOL ) 500 MG tablet; Take 2 tablets (1,000 mg total) by mouth every 6 (six) hours as needed for fever, headache or moderate pain (pain score 4-6).  Dispense: 30 tablet; Refill: 0 - azithromycin  (ZITHROMAX  Z-PAK) 250 MG tablet; Take 500 mg on day 1 followed by 250 mg for 4 days.  Take medication for a total of 5 days.  Dispense: 6 tablet; Refill: 0  -Continue to monitor symptoms for any change in severity if there is any escalation of current symptoms or development of new symptoms follow-up in ER for further evaluation and management. "

## 2024-01-11 NOTE — ED Provider Notes (Signed)
 " [facility]  Note:  This document was prepared using Dragon voice recognition software and may include unintentional dictation errors.  MRN: [id number] DOB: December 13, 1960  Subjective:   Mark [name] is a 63 y.o. male presenting for evaluation for persistent cough, body aches, congestion, chills/fever, mucus production with cough x 1 week.  Patient has been taking Mucinex  and Alka-Seltzer cough and cold with minimal improvement to symptoms.  Denies any known sick contacts.  No severe shortness of breath, chest pain, weakness, dizziness, nausea/vomiting, diarrhea.  Current Medications[1]   Allergies[2]  Past Medical History:  Diagnosis Date   Above knee amputation status, right    Anxiety    Depression    Diabetes mellitus, type II (HCC)    Hyperlipemia    Hypertension    PTSD (post-traumatic stress disorder)    Shoulder pain    Sleep apnea      Past Surgical History:  Procedure Laterality Date   HERNIA REPAIR     INCISION AND DRAINAGE Left 12/11/2019   Procedure: INCISION AND DRAINAGE POSTERIOR LEFT THIGH ABSCESS;  Surgeon: Rubin Calamity, MD;  Location: [facility] OR;  Service: General;  Laterality: Left;   INCISION AND DRAINAGE ABSCESS Left 12/11/2019   Procedure: INCISION AND DRAINAGE BACK ABSCESS,;  Surgeon: Rubin Calamity, MD;  Location: [facility] OR;  Service: General;  Laterality: Left;   INCISION AND DRAINAGE ABSCESS Left 10/16/2020   Procedure: INCISION AND DRAINAGE ABSCESS;  Surgeon: Curvin Deward MOULD, MD;  Location: [facility] ORS;  Service: General;  Laterality: Left;   LEG AMPUTATION Right    parenatal cyst      Family History  Problem Relation Age of Onset   Aneurysm Mother    Leukemia Father    Dementia Maternal Aunt    Seizures Paternal Uncle     Social History[3]  ROS Refer to HPI for ROS details.  Objective:    Vitals: BP (!) 154/75 (BP Location: Right Arm)   Pulse (!) 102   Temp 98.2 F (36.8 C) (Oral)   Resp 18   SpO2 96%   Physical  Exam Vitals and nursing note reviewed.  Constitutional:      General: He is not in acute distress.    Appearance: Normal appearance. He is well-developed. He is not ill-appearing or toxic-appearing.  HENT:     Head: Normocephalic.     Nose: Congestion and rhinorrhea present.     Mouth/Throat:     Mouth: Mucous membranes are moist.     Pharynx: Oropharynx is clear. No posterior oropharyngeal erythema.  Cardiovascular:     Rate and Rhythm: Normal rate and regular rhythm.     Heart sounds: Normal heart sounds. No murmur heard. Pulmonary:     Effort: Pulmonary effort is normal. No respiratory distress.     Breath sounds: Normal breath sounds. No stridor. No wheezing, rhonchi or rales.  Chest:     Chest wall: No tenderness.  Skin:    General: Skin is warm and dry.  Neurological:     General: No focal deficit present.     Mental Status: He is alert and oriented to person, place, and time.  Psychiatric:        Mood and Affect: Mood normal.        Behavior: Behavior normal.     Procedures  No results found for this or any previous visit (from the past 24 hours).  Assessment and Plan :     Discharge Instructions  1. Bronchitis (Primary) - azelastine  (ASTELIN ) 0.1 % nasal spray; Place 1 spray into both nostrils 2 (two) times daily. Use in each nostril as directed  Dispense: 30 mL; Refill: 1 - acetaminophen  (TYLENOL ) 500 MG tablet; Take 2 tablets (1,000 mg total) by mouth every 6 (six) hours as needed for fever, headache or moderate pain (pain score 4-6).  Dispense: 30 tablet; Refill: 0 - azithromycin  (ZITHROMAX  Z-PAK) 250 MG tablet; Take 500 mg on day 1 followed by 250 mg for 4 days.  Take medication for a total of 5 days.  Dispense: 6 tablet; Refill: 0  -Continue to monitor symptoms for any change in severity if there is any escalation of current symptoms or development of new symptoms follow-up in ER for further evaluation and management.      [name] B Bre Pecina     [1] No current facility-administered medications for this encounter.  Current Outpatient Medications:    acetaminophen  (TYLENOL ) 500 MG tablet, Take 2 tablets (1,000 mg total) by mouth every 6 (six) hours as needed for fever, headache or moderate pain (pain score 4-6)., Disp: 30 tablet, Rfl: 0   azelastine  (ASTELIN ) 0.1 % nasal spray, Place 1 spray into both nostrils 2 (two) times daily. Use in each nostril as directed, Disp: 30 mL, Rfl: 1   azithromycin  (ZITHROMAX  Z-PAK) 250 MG tablet, Take 500 mg on day 1 followed by 250 mg for 4 days.  Take medication for a total of 5 days., Disp: 6 tablet, Rfl: 0   ARIPiprazole  (ABILIFY ) 10 MG tablet, Take 10 mg by mouth daily., Disp: , Rfl:    buPROPion  (WELLBUTRIN  XL) 150 MG 24 hr tablet, Take 1 tablet (150 mg total) by mouth daily., Disp: 30 tablet, Rfl: 0   busPIRone  (BUSPAR ) 15 MG tablet, Take 1 tablet (15 mg total) by mouth 3 (three) times daily., Disp: 90 tablet, Rfl: 2   carvedilol  (COREG ) 3.125 MG tablet, Take 1 tablet (3.125 mg total) by mouth 2 (two) times daily with a meal., Disp: 60 tablet, Rfl: 0   clindamycin  (CLEOCIN  T) 1 % external solution, Apply 1 Application topically 2 (two) times daily., Disp: , Rfl:    cyclobenzaprine (FLEXERIL) 10 MG tablet, Take 10 mg by mouth 2 (two) times daily as needed for muscle spasms., Disp: , Rfl:    diclofenac Sodium (VOLTAREN) 1 % GEL, Apply 2 g topically daily as needed (pain)., Disp: , Rfl:    escitalopram  (LEXAPRO ) 20 MG tablet, Take 20 mg by mouth daily., Disp: , Rfl:    furosemide  (LASIX ) 40 MG tablet, Take 1 tablet (40 mg total) by mouth daily., Disp: 7 tablet, Rfl: 0   hydrOXYzine  (VISTARIL ) 25 MG capsule, Take 25-50 mg by mouth 2 (two) times daily as needed for anxiety., Disp: , Rfl:    insulin  aspart (NOVOLOG ) 100 UNIT/ML injection, Inject 15 Units into the skin 3 (three) times daily before meals., Disp: 10 mL, Rfl: 11   insulin  glargine (LANTUS ) 100 UNIT/ML injection, Inject 0.3 mLs (30 Units  total) into the skin 2 (two) times daily., Disp: 10 mL, Rfl: 11   losartan  (COZAAR ) 50 MG tablet, Take 1 tablet (50 mg total) by mouth daily., Disp: 30 tablet, Rfl: 0   Menthol -Methyl Salicylate (THERA-GESIC) 1-15 % CREA, Apply 1 Application topically 2 (two) times daily as needed for pain., Disp: , Rfl:    modafinil (PROVIGIL) 100 MG tablet, Take 100 mg by mouth daily., Disp: , Rfl:    morphine  20 MG/5ML solution, Take 2.5 mLs (  10 mg total) by mouth every 4 (four) hours as needed for pain., Disp: 50 mL, Rfl: 0   ondansetron  (ZOFRAN ) 4 MG tablet, Take 1 tablet (4 mg total) by mouth every 6 (six) hours as needed for nausea., Disp: 20 tablet, Rfl: 0   prazosin  (MINIPRESS ) 2 MG capsule, Take 1 capsule (2 mg total) by mouth at bedtime., Disp: 30 capsule, Rfl: 2   sildenafil (VIAGRA) 100 MG tablet, Take 100 mg by mouth daily as needed for erectile dysfunction., Disp: , Rfl:    silver sulfADIAZINE (SILVADENE) 1 % cream, Apply 1 Application topically daily as needed (affected area)., Disp: , Rfl:    traZODone  (DESYREL ) 50 MG tablet, Take 1 tablet (50 mg total) by mouth at bedtime as needed. (Patient taking differently: Take 50 mg by mouth at bedtime as needed for sleep.), Disp: 30 tablet, Rfl: 2 [2]  Allergies Allergen Reactions   Ozempic (0.25 Or 0.5 Mg-Dose) [Semaglutide(0.25 Or 0.5mg -Dos)] Other (See Comments)    Caused Pancreatitis   Semaglutide Other (See Comments)    Caused pancreatitis   Lyrica [Pregabalin] Rash  [3]  Social History Tobacco Use   Smoking status: Former    Current packs/day: 0.00    Types: Cigarettes    Quit date: 08/22/2019    Years since quitting: 4.3   Smokeless tobacco: Never  Vaping Use   Vaping status: Never Used  Substance Use Topics   Alcohol use: Yes    Alcohol/week: 2.0 standard drinks of alcohol    Types: 2 Cans of beer per week   Drug use: Yes    Frequency: 14.0 times per week    Types: Marijuana    Comment: Uses 1-2 times a day per report for his  anxiety     Aurea Goodell B, NP 01/11/24 2016  "

## 2024-01-11 NOTE — ED Triage Notes (Signed)
 Taking Mucinex  DM  AND ALKA SELTZER  FOR cough, body aches, and congestion that started last Saturday.

## 2024-01-13 ENCOUNTER — Ambulatory Visit (HOSPITAL_COMMUNITY): Admitting: Licensed Clinical Social Worker

## 2024-01-13 ENCOUNTER — Encounter (HOSPITAL_COMMUNITY): Payer: Self-pay

## 2024-01-18 ENCOUNTER — Encounter (HOSPITAL_COMMUNITY): Payer: Self-pay | Admitting: Licensed Clinical Social Worker

## 2024-01-18 NOTE — Progress Notes (Signed)
 Virtual Visit via Video Note  I connected with Mark [name] on 01/09/24 at 4-5pm EST by video-enabled telemedicine with the correct person using two identifiers.   I discussed the limitations of evaluation and management by telemedicine and the availability of in person appointments. The patient expressed understanding and agreed to proceed.  LOCATION Patient: home Provider: home office    History of Present Illness: Pt was referred to [facility] OP therapy for PTSD, depression and anxiety by the [facility].  Treatment Goal Addressed:   Pt will reduce irritability and increase normal social interaction with family and friends and will develop the ability to recognize, accept, and cope with feelings of depression and anxiety by using practiced coping skills for depression 1-2x daily AEB self report.  Progress towards Goal:  Progressing    Observations/Objective: Pt presented for today's session anxious, oriented x5, with no evidence or self-report of SI/HI or A/V H.  Patient reported ongoing compliance with medication. Clinician inquired about patient's current emotional ratings, as well as any significant changes in thoughts, feelings or behavior since previous session. Patient reported scores of 7/10 for depression, 6/10 for anxiety, 4/10 for anger/irritability, and denied any panic attacks. Pt identified other stressors that occurred in his life over the past week, along with coping skills.:  grounding and breathing exercises. Stressors: packing up house, family dysfunction, moving back to [place], family, moving back to toxic environment, giving away ESA,  financial issues, chronic pain,  amputee disability, lack of family support, pending MRI results of bottom, upcoming holiday with toxic family..  I still have an appt with a infectious disease dr in January.at the [place].My depression and anxiety continues to be high and I still never want to leave the house. Cln asked open ended questions about his  depressive and anxiety symptoms.. Cln and pt role played possible scenarios from his toxic family and coping skills.      Collaboration of Care: Other: Continue seeing providers at [place].    Patient/Guardian was advised Release of Information must be obtained prior to any record release in order to collaborate their care with an outside provider. Patient/Guardian was advised if they have not already done so to contact the registration department to sign all necessary forms in order for us  to release information regarding their care.    Consent: Patient/Guardian gives verbal consent for treatment and assignment of benefits for services provided during this visit. Patient/Guardian expressed understanding and agreed to proceed.     Assessment and Plan: Counselor will continue to meet with patient and address treatment plan goals. Patient was given a chance for recommendations of providers and implement skills learned in session and encouraged to practice coping skills between sessions, practicing between sessions.Diagnosis: anxiety, depression and ptsd  Follow Up Instructions I discussed the assessment and treatment plan with the patient. The patient was provided an opportunity to ask questions and all were answered. The patient agreed with the plan and demonstrated an understanding of the instructions.   The patient was advised to call back or seek an in-person evaluation if the symptoms worsen or if the condition fails to improve as anticipated.  I provided 60 minutes of non-face-to-face time during this encounter.   Ozell Ferrera S, LCAS-A  .

## 2024-01-20 ENCOUNTER — Encounter (HOSPITAL_COMMUNITY): Payer: Self-pay

## 2024-01-20 ENCOUNTER — Ambulatory Visit (HOSPITAL_COMMUNITY): Admitting: Licensed Clinical Social Worker

## 2024-01-27 ENCOUNTER — Encounter (HOSPITAL_COMMUNITY): Payer: Self-pay | Admitting: Licensed Clinical Social Worker

## 2024-01-27 ENCOUNTER — Ambulatory Visit (HOSPITAL_COMMUNITY): Admitting: Licensed Clinical Social Worker

## 2024-01-27 DIAGNOSIS — F329 Major depressive disorder, single episode, unspecified: Secondary | ICD-10-CM

## 2024-01-27 DIAGNOSIS — F419 Anxiety disorder, unspecified: Secondary | ICD-10-CM | POA: Diagnosis not present

## 2024-01-27 DIAGNOSIS — F4310 Post-traumatic stress disorder, unspecified: Secondary | ICD-10-CM

## 2024-01-27 DIAGNOSIS — F411 Generalized anxiety disorder: Secondary | ICD-10-CM

## 2024-01-27 DIAGNOSIS — F331 Major depressive disorder, recurrent, moderate: Secondary | ICD-10-CM

## 2024-01-27 NOTE — Progress Notes (Signed)
 Virtual Visit via Video Group Note  I connected with Mark [name] on 02/16/24 at 5:00-6:30 pm EST by a video enabled telemedicine application and verified that I am speaking with the correct person using two identifiers.   I discussed the limitations of evaluation and management by telemedicine and the availability of in person appointments. The patient expressed understanding and agreed to proceed.  LOCATION: Patient: Home Provider: home Office  History of Present Illness: [facility], [facility].  Treatment Goal Addressed:  Pt will develop the ability to recognize, accept, and cope with feelings of anxiety, depression and PTSD AEB self report.  Progress towards Goal:  Progressing    Observation/Objective:   Patient participated in group therapy on making SMART goals for the new year of 2026. Clinician provided education on goal setting where SMART goals help focus efforts and increases the chances of achieving goals.  Patient was encouraged to identify personal goals for the new year of 2026.       GOAL FOR YEAR:    Assessment and Plan:  Counselor will continue to meet with patient to address treatment plan goals. Patient will continue to follow recommendations of providers and implement skills learned in session. Diagnosis: MDD, Anxiety, PTSD    Follow-up instructions:I discussed the assessment and treatment plan with the patient. Patient was provided an opportunity to ask questions and all were answered. The patient agreed with the plan and demonstrated an understanding of the instructions.   The patient was advised to call back or seek an in-person evaluation if the symptoms worsen or if the condition fails to improve as anticipated.  Collaboration of Care: Other: Continue to see providers at [facility]  Patient/Guardian was advised Release of Information must be obtained prior to any record release in order to  collaborate their care with an outside provider. Patient/Guardian was advised if they have not already done so to contact the registration department to sign all necessary forms in order for us  to release information regarding their care.   Consent: Patient/Guardian gives verbal consent for treatment and assignment of benefits for services provided during this visit. Patient/Guardian expressed understanding and agreed to proceed.      I provided 90 minutes of non-face-to-face time during this encounter.   Emilija Bohman S, LCAS-A              02/16/24

## 2024-01-30 ENCOUNTER — Ambulatory Visit (INDEPENDENT_AMBULATORY_CARE_PROVIDER_SITE_OTHER): Admitting: Licensed Clinical Social Worker

## 2024-01-30 DIAGNOSIS — F4310 Post-traumatic stress disorder, unspecified: Secondary | ICD-10-CM | POA: Diagnosis not present

## 2024-01-30 DIAGNOSIS — F411 Generalized anxiety disorder: Secondary | ICD-10-CM

## 2024-01-30 DIAGNOSIS — F331 Major depressive disorder, recurrent, moderate: Secondary | ICD-10-CM | POA: Diagnosis not present

## 2024-02-03 ENCOUNTER — Ambulatory Visit (INDEPENDENT_AMBULATORY_CARE_PROVIDER_SITE_OTHER): Admitting: Licensed Clinical Social Worker

## 2024-02-03 ENCOUNTER — Encounter (HOSPITAL_COMMUNITY): Payer: Self-pay | Admitting: Licensed Clinical Social Worker

## 2024-02-03 DIAGNOSIS — F419 Anxiety disorder, unspecified: Secondary | ICD-10-CM

## 2024-02-03 DIAGNOSIS — F329 Major depressive disorder, single episode, unspecified: Secondary | ICD-10-CM | POA: Diagnosis not present

## 2024-02-03 DIAGNOSIS — F331 Major depressive disorder, recurrent, moderate: Secondary | ICD-10-CM

## 2024-02-03 DIAGNOSIS — F4310 Post-traumatic stress disorder, unspecified: Secondary | ICD-10-CM

## 2024-02-03 DIAGNOSIS — F411 Generalized anxiety disorder: Secondary | ICD-10-CM

## 2024-02-03 NOTE — Progress Notes (Signed)
 Virtual Visit via Video Group Note  I connected with Mark [name] on 02/03/24 at 5:00-6:30 pm EST by a video enabled telemedicine application and verified that I am speaking with the correct person using two identifiers.   I discussed the limitations of evaluation and management by telemedicine and the availability of in person appointments. The patient expressed understanding and agreed to proceed.  LOCATION: Patient: Home Provider: home Office  History of Present Illness: [facility], depression, [facility].  Treatment Goal Addressed:  Pt will develop the ability to recognize, accept, and cope with feelings of anxiety, depression and PTSD AEB self report.  Progress towards Goal:  Progressing    Observation/Objective:   Patient participated in a discussion on overcoming life's challenges, especially with family, under stress, which makes it more difficult to overcome obstacles in life. Patient was encouraged to identify life challenges and explore strategies for overcoming obstacles in life.           Assessment and Plan:  Counselor will continue to meet with patient to address treatment plan goals. Patient will continue to follow recommendations of providers and implement skills learned in session. Diagnosis: MDD, Anxiety, PTSD    Follow-up instructions:I discussed the assessment and treatment plan with the patient. Patient was provided an opportunity to ask questions and all were answered. The patient agreed with the plan and demonstrated an understanding of the instructions.   The patient was advised to call back or seek an in-person evaluation if the symptoms worsen or if the condition fails to improve as anticipated.  Collaboration of Care: Other: Continue to see providers at [facility]  Patient/Guardian was advised Release of Information must be obtained prior to any record release in order to collaborate their care with an  outside provider. Patient/Guardian was advised if they have not already done so to contact the registration department to sign all necessary forms in order for us  to release information regarding their care.   Consent: Patient/Guardian gives verbal consent for treatment and assignment of benefits for services provided during this visit. Patient/Guardian expressed understanding and agreed to proceed.      I provided 90 minutes of non-face-to-face time during this encounter.   Elman Dettman S, LCAS-A              02/03/24

## 2024-02-06 ENCOUNTER — Ambulatory Visit (HOSPITAL_COMMUNITY): Admitting: Licensed Clinical Social Worker

## 2024-02-06 ENCOUNTER — Encounter (HOSPITAL_COMMUNITY): Payer: Self-pay | Admitting: Licensed Clinical Social Worker

## 2024-02-06 ENCOUNTER — Encounter (HOSPITAL_COMMUNITY): Payer: Self-pay

## 2024-02-06 NOTE — Progress Notes (Deleted)
 Virtual Visit via Video Note  I connected with Mark [name] on 01/09/24 at 4-5pm EST by video-enabled telemedicine with the correct person using two identifiers.   I discussed the limitations of evaluation and management by telemedicine and the availability of in person appointments. The patient expressed understanding and agreed to proceed.  LOCATION Patient: home Provider: home office    History of Present Illness: Pt was referred to [facility] OP therapy for PTSD, depression and anxiety by the [facility].  Treatment Goal Addressed:   Pt will reduce irritability and increase normal social interaction with family and friends and will develop the ability to recognize, accept, and cope with feelings of depression and anxiety by using practiced coping skills for depression 1-2x daily AEB self report.  Progress towards Goal:  Progressing    Observations/Objective: Pt presented for today's session anxious, oriented x5, with no evidence or self-report of SI/HI or A/V H.  Patient reported ongoing compliance with medication. Clinician inquired about patient's current emotional ratings, as well as any significant changes in thoughts, feelings or behavior since previous session. Patient reported scores of 7/10 for depression, 6/10 for anxiety, 4/10 for anger/irritability, and denied any panic attacks. Pt identified other stressors that occurred in his life over the past week, along with coping skills.:  grounding and breathing exercises. Stressors: packing up house, family dysfunction, moving back to [place], family, moving back to toxic environment, giving away ESA,  financial issues, chronic pain,  amputee disability, lack of family support, pending MRI results of bottom, upcoming holiday with toxic family..  I still have an appt with a infectious disease dr in January.at the [place].My depression and anxiety continues to be high and I still never want to leave the house. Cln asked open ended questions about his  depressive and anxiety symptoms.. Cln and pt role played possible scenarios from his toxic family and coping skills.      Collaboration of Care: Other: Continue seeing providers at [place].    Patient/Guardian was advised Release of Information must be obtained prior to any record release in order to collaborate their care with an outside provider. Patient/Guardian was advised if they have not already done so to contact the registration department to sign all necessary forms in order for us  to release information regarding their care.    Consent: Patient/Guardian gives verbal consent for treatment and assignment of benefits for services provided during this visit. Patient/Guardian expressed understanding and agreed to proceed.     Assessment and Plan: Counselor will continue to meet with patient and address treatment plan goals. Patient was given a chance for recommendations of providers and implement skills learned in session and encouraged to practice coping skills between sessions, practicing between sessions.Diagnosis: anxiety, depression and ptsd  Follow Up Instructions I discussed the assessment and treatment plan with the patient. The patient was provided an opportunity to ask questions and all were answered. The patient agreed with the plan and demonstrated an understanding of the instructions.   The patient was advised to call back or seek an in-person evaluation if the symptoms worsen or if the condition fails to improve as anticipated.  I provided 60 minutes of non-face-to-face time during this encounter.   Ozell Ferrera S, LCAS-A  .

## 2024-02-10 ENCOUNTER — Ambulatory Visit (HOSPITAL_COMMUNITY): Admitting: Licensed Clinical Social Worker

## 2024-02-10 DIAGNOSIS — F331 Major depressive disorder, recurrent, moderate: Secondary | ICD-10-CM

## 2024-02-10 DIAGNOSIS — F411 Generalized anxiety disorder: Secondary | ICD-10-CM

## 2024-02-10 DIAGNOSIS — F4310 Post-traumatic stress disorder, unspecified: Secondary | ICD-10-CM

## 2024-02-12 ENCOUNTER — Encounter (HOSPITAL_COMMUNITY): Payer: Self-pay | Admitting: Licensed Clinical Social Worker

## 2024-02-12 NOTE — Progress Notes (Signed)
 Virtual Visit via Video Note  I connected with Mark [name] on 01/30/24 at 4-5pm EST by video-enabled telemedicine with the correct person using two identifiers.   I discussed the limitations of evaluation and management by telemedicine and the availability of in person appointments. The patient expressed understanding and agreed to proceed.  LOCATION Patient: home Provider: home office    History of Present Illness: Pt was referred to [facility] OP therapy for PTSD, depression and anxiety by the [facility].  Treatment Goal Addressed:   Pt will reduce irritability and increase normal social interaction with family and friends and will develop the ability to recognize, accept, and cope with feelings of depression and anxiety by using practiced coping skills for depression 1-2x daily AEB self report.  Progress towards Goal:  Progressing    Observations/Objective: Pt presented for today's session anxious, oriented x5, with no evidence or self-report of SI/HI or A/V H.  Patient reported ongoing compliance with medication. Clinician inquired about patient's current emotional ratings, as well as any significant changes in thoughts, feelings or behavior since previous session. Patient reported scores of 7/10 for depression, 6/10 for anxiety, 4/10 for anger/irritability, and denied any panic attacks. Pt identified other stressors that occurred in his life over the past week, along with coping skills.:  grounding and breathing exercises. Stressors: packing up house, family dysfunction, moving back to [place], family, moving back to toxic environment, giving away ESA,  financial issues, chronic pain,  amputee disability, lack of family support, pending MRI results of bottom, holiday with toxic family..  I will have an appt with a infectious disease dr this month.at the [place].My depression and anxiety continues to be high and I still never want to leave the house. Cln and pt devised a plan to go out in the community,  one step at a time.      Collaboration of Care: Other: Continue seeing providers at [place].    Patient/Guardian was advised Release of Information must be obtained prior to any record release in order to collaborate their care with an outside provider. Patient/Guardian was advised if they have not already done so to contact the registration department to sign all necessary forms in order for us  to release information regarding their care.    Consent: Patient/Guardian gives verbal consent for treatment and assignment of benefits for services provided during this visit. Patient/Guardian expressed understanding and agreed to proceed.     Assessment and Plan: Counselor will continue to meet with patient and address treatment plan goals. Patient was given a chance for recommendations of providers and implement skills learned in session and encouraged to practice coping skills between sessions, practicing between sessions.Diagnosis: anxiety, depression and ptsd  Follow Up Instructions I discussed the assessment and treatment plan with the patient. The patient was provided an opportunity to ask questions and all were answered. The patient agreed with the plan and demonstrated an understanding of the instructions.   The patient was advised to call back or seek an in-person evaluation if the symptoms worsen or if the condition fails to improve as anticipated.  I provided 60 minutes of non-face-to-face time during this encounter.   Lynlee Stratton S, LCAS-A  .

## 2024-02-12 NOTE — Progress Notes (Signed)
 Virtual Visit via Video Group Note  I connected with Mark [name] on 02/03/24 at 5:00-6:30 pm EST by a video enabled telemedicine application and verified that I am speaking with the correct person using two identifiers.   I discussed the limitations of evaluation and management by telemedicine and the availability of in person appointments. The patient expressed understanding and agreed to proceed.  LOCATION: Patient: Home Provider: home Office  History of Present Illness: [facility], depression, anxiety.  Treatment Goal Addressed:  Pt will develop the ability to recognize, accept, and cope with feelings of anxiety, depression and PTSD AEB self report.  Progress towards Goal:  Progressing    Observation/Objective:     Patient participated in a group discussion on Mark [name] birthday celebration today, including the tenets of his speeches and effects of trauma in his lifetime.  Pt participated in discussion on trauma.      Assessment and Plan:  Counselor will continue to meet with patient to address treatment plan goals. Patient will continue to follow recommendations of providers and implement skills learned in session. Diagnosis: MDD, Anxiety, PTSD    Follow-up instructions:I discussed the assessment and treatment plan with the patient. Patient was provided an opportunity to ask questions and all were answered. The patient agreed with the plan and demonstrated an understanding of the instructions.   The patient was advised to call back or seek an in-person evaluation if the symptoms worsen or if the condition fails to improve as anticipated.  Collaboration of Care: Other: Continue to see providers at [facility]  Patient/Guardian was advised Release of Information must be obtained prior to any record release in order to collaborate their care with an outside provider. Patient/Guardian was advised if they have not already done  so to contact the registration department to sign all necessary forms in order for us  to release information regarding their care.   Consent: Patient/Guardian gives verbal consent for treatment and assignment of benefits for services provided during this visit. Patient/Guardian expressed understanding and agreed to proceed.      I provided 90 minutes of non-face-to-face time during this encounter.   Mark [name] S, LCAS-A              02/10/24

## 2024-02-13 ENCOUNTER — Ambulatory Visit (INDEPENDENT_AMBULATORY_CARE_PROVIDER_SITE_OTHER): Admitting: Licensed Clinical Social Worker

## 2024-02-13 DIAGNOSIS — F4310 Post-traumatic stress disorder, unspecified: Secondary | ICD-10-CM

## 2024-02-13 DIAGNOSIS — F411 Generalized anxiety disorder: Secondary | ICD-10-CM | POA: Diagnosis not present

## 2024-02-13 DIAGNOSIS — F331 Major depressive disorder, recurrent, moderate: Secondary | ICD-10-CM

## 2024-02-17 ENCOUNTER — Ambulatory Visit (HOSPITAL_COMMUNITY): Admitting: Licensed Clinical Social Worker

## 2024-02-20 ENCOUNTER — Ambulatory Visit (INDEPENDENT_AMBULATORY_CARE_PROVIDER_SITE_OTHER): Admitting: Licensed Clinical Social Worker

## 2024-02-20 ENCOUNTER — Encounter (HOSPITAL_COMMUNITY): Payer: Self-pay | Admitting: Licensed Clinical Social Worker

## 2024-02-20 DIAGNOSIS — F4310 Post-traumatic stress disorder, unspecified: Secondary | ICD-10-CM

## 2024-02-20 DIAGNOSIS — F331 Major depressive disorder, recurrent, moderate: Secondary | ICD-10-CM | POA: Diagnosis not present

## 2024-02-20 DIAGNOSIS — F411 Generalized anxiety disorder: Secondary | ICD-10-CM
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
## 2024-02-21 ENCOUNTER — Encounter (HOSPITAL_COMMUNITY): Payer: Self-pay | Admitting: Licensed Clinical Social Worker
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
## 2024-02-24 ENCOUNTER — Ambulatory Visit (INDEPENDENT_AMBULATORY_CARE_PROVIDER_SITE_OTHER): Admitting: Licensed Clinical Social Worker

## 2024-02-24 ENCOUNTER — Encounter (HOSPITAL_COMMUNITY): Payer: Self-pay | Admitting: Licensed Clinical Social Worker

## 2024-02-24 DIAGNOSIS — F411 Generalized anxiety disorder: Secondary | ICD-10-CM

## 2024-02-24 DIAGNOSIS — F329 Major depressive disorder, single episode, unspecified: Secondary | ICD-10-CM

## 2024-02-24 DIAGNOSIS — F419 Anxiety disorder, unspecified: Secondary | ICD-10-CM

## 2024-02-24 DIAGNOSIS — F4310 Post-traumatic stress disorder, unspecified: Secondary | ICD-10-CM | POA: Diagnosis not present

## 2024-02-24 DIAGNOSIS — F331 Major depressive disorder, recurrent, moderate: Secondary | ICD-10-CM
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
## 2024-02-27 ENCOUNTER — Ambulatory Visit (HOSPITAL_COMMUNITY): Admitting: Licensed Clinical Social Worker

## 2024-03-02 ENCOUNTER — Ambulatory Visit (HOSPITAL_COMMUNITY): Admitting: Licensed Clinical Social Worker

## 2024-03-05 ENCOUNTER — Ambulatory Visit (HOSPITAL_COMMUNITY): Admitting: Licensed Clinical Social Worker

## 2024-03-09 ENCOUNTER — Ambulatory Visit (HOSPITAL_COMMUNITY): Admitting: Licensed Clinical Social Worker

## 2024-03-12 ENCOUNTER — Ambulatory Visit (HOSPITAL_COMMUNITY): Admitting: Licensed Clinical Social Worker

## 2024-03-16 ENCOUNTER — Ambulatory Visit (HOSPITAL_COMMUNITY): Admitting: Licensed Clinical Social Worker

## 2024-03-19 ENCOUNTER — Ambulatory Visit (HOSPITAL_COMMUNITY): Admitting: Licensed Clinical Social Worker
